# Patient Record
Sex: Female | Born: 1935
Health system: Southern US, Community
[De-identification: ages and names within clinical notes are randomized; demographics above are authoritative.]

## PROBLEM LIST (undated history)

## (undated) DIAGNOSIS — M545 Low back pain, unspecified: Secondary | ICD-10-CM

## (undated) DIAGNOSIS — J209 Acute bronchitis, unspecified: Secondary | ICD-10-CM

## (undated) DIAGNOSIS — M1990 Unspecified osteoarthritis, unspecified site: Secondary | ICD-10-CM

## (undated) DIAGNOSIS — I509 Heart failure, unspecified: Secondary | ICD-10-CM

## (undated) DIAGNOSIS — G4700 Insomnia, unspecified: Secondary | ICD-10-CM

## (undated) DIAGNOSIS — E1329 Other specified diabetes mellitus with other diabetic kidney complication: Secondary | ICD-10-CM

## (undated) DIAGNOSIS — E876 Hypokalemia: Secondary | ICD-10-CM

## (undated) DIAGNOSIS — Z9889 Other specified postprocedural states: Secondary | ICD-10-CM

## (undated) DIAGNOSIS — G609 Hereditary and idiopathic neuropathy, unspecified: Secondary | ICD-10-CM

## (undated) DIAGNOSIS — K449 Diaphragmatic hernia without obstruction or gangrene: Secondary | ICD-10-CM

## (undated) DIAGNOSIS — K219 Gastro-esophageal reflux disease without esophagitis: Secondary | ICD-10-CM

## (undated) DIAGNOSIS — M170 Bilateral primary osteoarthritis of knee: Secondary | ICD-10-CM

## (undated) DIAGNOSIS — M899 Disorder of bone, unspecified: Secondary | ICD-10-CM

## (undated) DIAGNOSIS — M949 Disorder of cartilage, unspecified: Secondary | ICD-10-CM

## (undated) DIAGNOSIS — N182 Chronic kidney disease, stage 2 (mild): Secondary | ICD-10-CM

## (undated) DIAGNOSIS — H409 Unspecified glaucoma: Secondary | ICD-10-CM

## (undated) DIAGNOSIS — I10 Essential (primary) hypertension: Secondary | ICD-10-CM

## (undated) DIAGNOSIS — J449 Chronic obstructive pulmonary disease, unspecified: Secondary | ICD-10-CM

## (undated) DIAGNOSIS — IMO0002 Reserved for concepts with insufficient information to code with codable children: Secondary | ICD-10-CM

## (undated) DIAGNOSIS — F419 Anxiety disorder, unspecified: Secondary | ICD-10-CM

## (undated) DIAGNOSIS — E669 Obesity, unspecified: Secondary | ICD-10-CM

## (undated) DIAGNOSIS — E785 Hyperlipidemia, unspecified: Secondary | ICD-10-CM

## (undated) DIAGNOSIS — M419 Scoliosis, unspecified: Secondary | ICD-10-CM

## (undated) DIAGNOSIS — R112 Nausea with vomiting, unspecified: Secondary | ICD-10-CM

## (undated) HISTORY — PX: ABDOMINAL HYSTERECTOMY: SHX81

## (undated) HISTORY — DX: Disorder of bone, unspecified: M89.9

## (undated) HISTORY — DX: Low back pain, unspecified: M54.50

## (undated) HISTORY — DX: Obesity, unspecified: E66.9

## (undated) HISTORY — PX: SHOULDER SURGERY: SHX246

## (undated) HISTORY — PX: FOOT SURGERY: SHX648

## (undated) HISTORY — DX: Chronic kidney disease, stage 2 (mild): N18.2

## (undated) HISTORY — DX: Reserved for concepts with insufficient information to code with codable children: IMO0002

## (undated) HISTORY — DX: Hereditary and idiopathic neuropathy, unspecified: G60.9

## (undated) HISTORY — DX: Unspecified osteoarthritis, unspecified site: M19.90

## (undated) HISTORY — DX: Heart failure, unspecified: I50.9

## (undated) HISTORY — DX: Acute bronchitis, unspecified: J20.9

## (undated) HISTORY — DX: Chronic obstructive pulmonary disease, unspecified: J44.9

## (undated) HISTORY — DX: Low back pain: M54.5

## (undated) HISTORY — DX: Unspecified glaucoma: H40.9

## (undated) HISTORY — DX: Essential (primary) hypertension: I10

## (undated) HISTORY — DX: Gastro-esophageal reflux disease without esophagitis: K21.9

## (undated) HISTORY — PX: KNEE ARTHROSCOPY: SUR90

## (undated) HISTORY — DX: Bilateral primary osteoarthritis of knee: M17.0

## (undated) HISTORY — DX: Diaphragmatic hernia without obstruction or gangrene: K44.9

## (undated) HISTORY — PX: TOTAL HIP ARTHROPLASTY: SHX124

## (undated) HISTORY — DX: Other specified diabetes mellitus with other diabetic kidney complication: E13.29

## (undated) HISTORY — DX: Hypokalemia: E87.6

## (undated) HISTORY — DX: Disorder of cartilage, unspecified: M94.9

## (undated) HISTORY — DX: Insomnia, unspecified: G47.00

## (undated) HISTORY — DX: Anxiety disorder, unspecified: F41.9

## (undated) HISTORY — DX: Hyperlipidemia, unspecified: E78.5

---

## 1998-04-23 ENCOUNTER — Ambulatory Visit (HOSPITAL_COMMUNITY): Admission: RE | Admit: 1998-04-23 | Discharge: 1998-04-23 | Payer: Self-pay | Admitting: Family Medicine

## 1998-05-08 ENCOUNTER — Other Ambulatory Visit: Admission: RE | Admit: 1998-05-08 | Discharge: 1998-05-08 | Payer: Self-pay | Admitting: Family Medicine

## 1998-05-14 ENCOUNTER — Encounter: Admission: RE | Admit: 1998-05-14 | Discharge: 1998-08-12 | Payer: Self-pay | Admitting: Orthopedic Surgery

## 1998-06-05 ENCOUNTER — Other Ambulatory Visit: Admission: RE | Admit: 1998-06-05 | Discharge: 1998-06-05 | Payer: Self-pay | Admitting: Family Medicine

## 1998-06-11 ENCOUNTER — Other Ambulatory Visit: Admission: RE | Admit: 1998-06-11 | Discharge: 1998-06-11 | Payer: Self-pay | Admitting: Family Medicine

## 1999-01-22 ENCOUNTER — Encounter: Payer: Self-pay | Admitting: Emergency Medicine

## 1999-01-22 ENCOUNTER — Emergency Department (HOSPITAL_COMMUNITY): Admission: EM | Admit: 1999-01-22 | Discharge: 1999-01-22 | Payer: Self-pay | Admitting: Emergency Medicine

## 1999-04-18 ENCOUNTER — Ambulatory Visit (HOSPITAL_COMMUNITY): Admission: RE | Admit: 1999-04-18 | Discharge: 1999-04-18 | Payer: Self-pay | Admitting: Orthopedic Surgery

## 1999-04-18 ENCOUNTER — Encounter: Payer: Self-pay | Admitting: Orthopedic Surgery

## 1999-09-12 ENCOUNTER — Emergency Department (HOSPITAL_COMMUNITY): Admission: EM | Admit: 1999-09-12 | Discharge: 1999-09-12 | Payer: Self-pay | Admitting: Emergency Medicine

## 1999-09-12 ENCOUNTER — Encounter: Payer: Self-pay | Admitting: Emergency Medicine

## 2000-05-21 ENCOUNTER — Ambulatory Visit (HOSPITAL_COMMUNITY): Admission: RE | Admit: 2000-05-21 | Discharge: 2000-05-21 | Payer: Self-pay | Admitting: Orthopedic Surgery

## 2000-06-16 ENCOUNTER — Encounter: Payer: Self-pay | Admitting: Orthopedic Surgery

## 2000-06-16 ENCOUNTER — Ambulatory Visit (HOSPITAL_COMMUNITY): Admission: RE | Admit: 2000-06-16 | Discharge: 2000-06-16 | Payer: Self-pay | Admitting: Orthopedic Surgery

## 2000-07-05 ENCOUNTER — Encounter: Admission: RE | Admit: 2000-07-05 | Discharge: 2000-10-03 | Payer: Self-pay | Admitting: Orthopedic Surgery

## 2000-11-02 ENCOUNTER — Encounter: Payer: Self-pay | Admitting: Family Medicine

## 2000-11-02 ENCOUNTER — Ambulatory Visit (HOSPITAL_COMMUNITY): Admission: RE | Admit: 2000-11-02 | Discharge: 2000-11-02 | Payer: Self-pay | Admitting: *Deleted

## 2000-11-27 ENCOUNTER — Emergency Department (HOSPITAL_COMMUNITY): Admission: EM | Admit: 2000-11-27 | Discharge: 2000-11-28 | Payer: Self-pay | Admitting: Emergency Medicine

## 2001-04-25 ENCOUNTER — Encounter: Payer: Self-pay | Admitting: Emergency Medicine

## 2001-04-25 ENCOUNTER — Emergency Department (HOSPITAL_COMMUNITY): Admission: EM | Admit: 2001-04-25 | Discharge: 2001-04-25 | Payer: Self-pay | Admitting: Emergency Medicine

## 2001-05-10 ENCOUNTER — Ambulatory Visit (HOSPITAL_COMMUNITY): Admission: RE | Admit: 2001-05-10 | Discharge: 2001-05-10 | Payer: Self-pay | Admitting: Family Medicine

## 2001-11-21 ENCOUNTER — Ambulatory Visit (HOSPITAL_COMMUNITY): Admission: RE | Admit: 2001-11-21 | Discharge: 2001-11-21 | Payer: Self-pay | Admitting: Family Medicine

## 2001-11-21 ENCOUNTER — Encounter: Payer: Self-pay | Admitting: Family Medicine

## 2002-05-11 ENCOUNTER — Ambulatory Visit (HOSPITAL_COMMUNITY): Admission: RE | Admit: 2002-05-11 | Discharge: 2002-05-11 | Payer: Self-pay | Admitting: Family Medicine

## 2002-05-25 ENCOUNTER — Encounter: Payer: Self-pay | Admitting: Orthopedic Surgery

## 2002-05-25 ENCOUNTER — Ambulatory Visit (HOSPITAL_COMMUNITY): Admission: RE | Admit: 2002-05-25 | Discharge: 2002-05-25 | Payer: Self-pay | Admitting: Orthopedic Surgery

## 2002-06-13 ENCOUNTER — Emergency Department (HOSPITAL_COMMUNITY): Admission: EM | Admit: 2002-06-13 | Discharge: 2002-06-13 | Payer: Self-pay

## 2002-06-29 ENCOUNTER — Encounter: Payer: Self-pay | Admitting: Orthopedic Surgery

## 2002-06-29 ENCOUNTER — Ambulatory Visit (HOSPITAL_COMMUNITY): Admission: RE | Admit: 2002-06-29 | Discharge: 2002-06-29 | Payer: Self-pay | Admitting: Orthopedic Surgery

## 2002-09-04 ENCOUNTER — Encounter: Payer: Self-pay | Admitting: Emergency Medicine

## 2002-09-04 ENCOUNTER — Emergency Department (HOSPITAL_COMMUNITY): Admission: EM | Admit: 2002-09-04 | Discharge: 2002-09-04 | Payer: Self-pay | Admitting: Emergency Medicine

## 2002-12-11 ENCOUNTER — Encounter: Payer: Self-pay | Admitting: Orthopedic Surgery

## 2002-12-11 ENCOUNTER — Ambulatory Visit (HOSPITAL_COMMUNITY): Admission: RE | Admit: 2002-12-11 | Discharge: 2002-12-11 | Payer: Self-pay | Admitting: Orthopedic Surgery

## 2003-12-07 ENCOUNTER — Emergency Department (HOSPITAL_COMMUNITY): Admission: EM | Admit: 2003-12-07 | Discharge: 2003-12-07 | Payer: Self-pay | Admitting: Emergency Medicine

## 2004-02-18 ENCOUNTER — Encounter: Admission: RE | Admit: 2004-02-18 | Discharge: 2004-02-18 | Payer: Self-pay | Admitting: Internal Medicine

## 2004-07-16 ENCOUNTER — Ambulatory Visit (HOSPITAL_COMMUNITY): Admission: RE | Admit: 2004-07-16 | Discharge: 2004-07-16 | Payer: Self-pay | Admitting: Family Medicine

## 2004-08-19 ENCOUNTER — Ambulatory Visit: Payer: Self-pay | Admitting: Nurse Practitioner

## 2004-10-13 ENCOUNTER — Ambulatory Visit: Payer: Self-pay | Admitting: Nurse Practitioner

## 2004-11-26 ENCOUNTER — Ambulatory Visit (HOSPITAL_COMMUNITY): Admission: RE | Admit: 2004-11-26 | Discharge: 2004-11-26 | Payer: Self-pay | Admitting: Nurse Practitioner

## 2004-11-26 ENCOUNTER — Ambulatory Visit: Payer: Self-pay | Admitting: Nurse Practitioner

## 2005-01-22 ENCOUNTER — Ambulatory Visit: Payer: Self-pay | Admitting: Nurse Practitioner

## 2005-06-16 ENCOUNTER — Ambulatory Visit: Payer: Self-pay | Admitting: Nurse Practitioner

## 2005-08-14 ENCOUNTER — Ambulatory Visit: Payer: Self-pay | Admitting: Nurse Practitioner

## 2005-08-20 ENCOUNTER — Inpatient Hospital Stay (HOSPITAL_COMMUNITY): Admission: RE | Admit: 2005-08-20 | Discharge: 2005-08-24 | Payer: Self-pay | Admitting: Orthopedic Surgery

## 2005-12-04 ENCOUNTER — Ambulatory Visit: Payer: Self-pay | Admitting: Nurse Practitioner

## 2005-12-11 ENCOUNTER — Ambulatory Visit (HOSPITAL_COMMUNITY): Admission: RE | Admit: 2005-12-11 | Discharge: 2005-12-11 | Payer: Self-pay | Admitting: Orthopedic Surgery

## 2005-12-29 ENCOUNTER — Ambulatory Visit: Admission: RE | Admit: 2005-12-29 | Discharge: 2005-12-29 | Payer: Self-pay | Admitting: Orthopedic Surgery

## 2006-02-09 ENCOUNTER — Ambulatory Visit: Payer: Self-pay | Admitting: Nurse Practitioner

## 2006-07-05 ENCOUNTER — Ambulatory Visit: Payer: Self-pay | Admitting: Nurse Practitioner

## 2006-07-22 ENCOUNTER — Ambulatory Visit: Payer: Self-pay | Admitting: Nurse Practitioner

## 2006-08-17 ENCOUNTER — Encounter: Admission: RE | Admit: 2006-08-17 | Discharge: 2006-08-17 | Payer: Self-pay | Admitting: Orthopedic Surgery

## 2006-12-07 ENCOUNTER — Ambulatory Visit: Payer: Self-pay | Admitting: Nurse Practitioner

## 2006-12-23 ENCOUNTER — Encounter: Admission: RE | Admit: 2006-12-23 | Discharge: 2006-12-23 | Payer: Self-pay | Admitting: Orthopedic Surgery

## 2007-03-08 ENCOUNTER — Ambulatory Visit: Payer: Self-pay | Admitting: Nurse Practitioner

## 2007-05-09 ENCOUNTER — Ambulatory Visit: Payer: Self-pay | Admitting: Family Medicine

## 2007-08-08 ENCOUNTER — Encounter: Admission: RE | Admit: 2007-08-08 | Discharge: 2007-08-08 | Payer: Self-pay | Admitting: Orthopedic Surgery

## 2007-08-23 ENCOUNTER — Encounter (INDEPENDENT_AMBULATORY_CARE_PROVIDER_SITE_OTHER): Payer: Self-pay | Admitting: Nurse Practitioner

## 2007-08-23 ENCOUNTER — Ambulatory Visit: Payer: Self-pay | Admitting: Family Medicine

## 2007-08-23 ENCOUNTER — Other Ambulatory Visit: Admission: RE | Admit: 2007-08-23 | Discharge: 2007-08-23 | Payer: Self-pay | Admitting: Family Medicine

## 2007-08-23 LAB — CONVERTED CEMR LAB
ALT: 10 units/L (ref 0–35)
AST: 12 units/L (ref 0–37)
Albumin: 4.3 g/dL (ref 3.5–5.2)
Alkaline Phosphatase: 108 units/L (ref 39–117)
BUN: 24 mg/dL — ABNORMAL HIGH (ref 6–23)
Basophils Absolute: 0 10*3/uL (ref 0.0–0.1)
Basophils Relative: 0 % (ref 0–1)
CO2: 24 meq/L (ref 19–32)
Calcium: 9.3 mg/dL (ref 8.4–10.5)
Chloride: 105 meq/L (ref 96–112)
Creatinine, Ser: 1.53 mg/dL — ABNORMAL HIGH (ref 0.40–1.20)
Eosinophils Absolute: 0.2 10*3/uL (ref 0.0–0.7)
Eosinophils Relative: 2 % (ref 0–5)
Glucose, Bld: 147 mg/dL — ABNORMAL HIGH (ref 70–99)
HCT: 39.2 % (ref 36.0–46.0)
Hemoglobin: 12.8 g/dL (ref 12.0–15.0)
Lymphocytes Relative: 37 % (ref 12–46)
Lymphs Abs: 2.5 10*3/uL (ref 0.7–3.3)
MCHC: 32.7 g/dL (ref 30.0–36.0)
MCV: 88.3 fL (ref 78.0–100.0)
Monocytes Absolute: 0.4 10*3/uL (ref 0.2–0.7)
Monocytes Relative: 6 % (ref 3–11)
Neutro Abs: 3.6 10*3/uL (ref 1.7–7.7)
Neutrophils Relative %: 54 % (ref 43–77)
Platelets: 242 10*3/uL (ref 150–400)
Potassium: 3.7 meq/L (ref 3.5–5.3)
RBC: 4.44 M/uL (ref 3.87–5.11)
RDW: 14.1 % — ABNORMAL HIGH (ref 11.5–14.0)
Sodium: 141 meq/L (ref 135–145)
TSH: 0.988 microintl units/mL (ref 0.350–5.50)
Total Bilirubin: 0.5 mg/dL (ref 0.3–1.2)
Total Protein: 7.3 g/dL (ref 6.0–8.3)
WBC: 6.7 10*3/uL (ref 4.0–10.5)

## 2007-09-05 ENCOUNTER — Ambulatory Visit (HOSPITAL_COMMUNITY): Admission: RE | Admit: 2007-09-05 | Discharge: 2007-09-05 | Payer: Self-pay | Admitting: Family Medicine

## 2007-10-10 ENCOUNTER — Encounter: Admission: RE | Admit: 2007-10-10 | Discharge: 2007-10-10 | Payer: Self-pay | Admitting: Orthopedic Surgery

## 2007-11-29 ENCOUNTER — Ambulatory Visit: Payer: Self-pay | Admitting: Internal Medicine

## 2008-02-16 ENCOUNTER — Inpatient Hospital Stay (HOSPITAL_COMMUNITY): Admission: RE | Admit: 2008-02-16 | Discharge: 2008-02-20 | Payer: Self-pay | Admitting: Orthopedic Surgery

## 2008-03-06 ENCOUNTER — Encounter (INDEPENDENT_AMBULATORY_CARE_PROVIDER_SITE_OTHER): Payer: Self-pay | Admitting: Orthopedic Surgery

## 2008-03-06 ENCOUNTER — Ambulatory Visit: Payer: Self-pay | Admitting: Surgery

## 2008-03-06 ENCOUNTER — Ambulatory Visit (HOSPITAL_COMMUNITY): Admission: RE | Admit: 2008-03-06 | Discharge: 2008-03-06 | Payer: Self-pay | Admitting: Orthopedic Surgery

## 2008-04-17 ENCOUNTER — Emergency Department (HOSPITAL_COMMUNITY): Admission: EM | Admit: 2008-04-17 | Discharge: 2008-04-18 | Payer: Self-pay | Admitting: Emergency Medicine

## 2008-05-17 ENCOUNTER — Ambulatory Visit: Payer: Self-pay | Admitting: Internal Medicine

## 2008-05-17 LAB — CONVERTED CEMR LAB
Albumin: 4.2 g/dL (ref 3.5–5.2)
Alkaline Phosphatase: 128 units/L — ABNORMAL HIGH (ref 39–117)
BUN: 15 mg/dL (ref 6–23)
CO2: 24 meq/L (ref 19–32)
Calcium: 8.9 mg/dL (ref 8.4–10.5)
Cholesterol: 169 mg/dL (ref 0–200)
Eosinophils Absolute: 0.2 10*3/uL (ref 0.0–0.7)
Glucose, Bld: 169 mg/dL — ABNORMAL HIGH (ref 70–99)
HCT: 38.1 % (ref 36.0–46.0)
HDL: 50 mg/dL (ref >39)
Lymphocytes Relative: 36 % (ref 12–46)
Lymphs Abs: 2.2 10*3/uL (ref 0.7–4.0)
MCV: 84.1 fL (ref 78.0–100.0)
Monocytes Relative: 8 % (ref 3–12)
Neutrophils Relative %: 53 % (ref 43–77)
Potassium: 4.2 meq/L (ref 3.5–5.3)
RBC: 4.53 M/uL (ref 3.87–5.11)
Sodium: 143 meq/L (ref 135–145)
Total Protein: 7.3 g/dL (ref 6.0–8.3)
Triglycerides: 178 mg/dL — ABNORMAL HIGH (ref <150)
WBC: 6 10*3/uL (ref 4.0–10.5)

## 2008-07-27 ENCOUNTER — Encounter: Admission: RE | Admit: 2008-07-27 | Discharge: 2008-07-27 | Payer: Self-pay | Admitting: Orthopedic Surgery

## 2008-08-20 ENCOUNTER — Ambulatory Visit: Payer: Self-pay | Admitting: Internal Medicine

## 2008-08-20 ENCOUNTER — Encounter (INDEPENDENT_AMBULATORY_CARE_PROVIDER_SITE_OTHER): Payer: Self-pay | Admitting: Nurse Practitioner

## 2008-08-20 LAB — CONVERTED CEMR LAB
ALT: 8 units/L (ref 0–35)
AST: 15 units/L (ref 0–37)
BUN: 18 mg/dL (ref 6–23)
Basophils Absolute: 0 10*3/uL (ref 0.0–0.1)
Basophils Relative: 0 % (ref 0–1)
Calcium: 9.4 mg/dL (ref 8.4–10.5)
Chloride: 108 meq/L (ref 96–112)
Cholesterol: 181 mg/dL (ref 0–200)
Creatinine, Ser: 1.13 mg/dL (ref 0.40–1.20)
Eosinophils Relative: 3 % (ref 0–5)
HCT: 39.3 % (ref 36.0–46.0)
HDL: 63 mg/dL (ref >39)
Hemoglobin: 12.7 g/dL (ref 12.0–15.0)
MCHC: 32.3 g/dL (ref 30.0–36.0)
MCV: 87.7 fL (ref 78.0–100.0)
Monocytes Absolute: 0.5 10*3/uL (ref 0.1–1.0)
Monocytes Relative: 8 % (ref 3–12)
Neutro Abs: 3.8 10*3/uL (ref 1.7–7.7)
RBC: 4.48 M/uL (ref 3.87–5.11)
RDW: 15.2 % (ref 11.5–15.5)
TSH: 1.025 microintl units/mL (ref 0.350–4.50)
Total Bilirubin: 0.5 mg/dL (ref 0.3–1.2)
Total CHOL/HDL Ratio: 2.9
VLDL: 30 mg/dL (ref 0–40)

## 2008-11-20 ENCOUNTER — Encounter: Admission: RE | Admit: 2008-11-20 | Discharge: 2008-11-20 | Payer: Self-pay | Admitting: Orthopedic Surgery

## 2008-12-26 ENCOUNTER — Ambulatory Visit: Payer: Self-pay | Admitting: Internal Medicine

## 2009-01-24 ENCOUNTER — Encounter (INDEPENDENT_AMBULATORY_CARE_PROVIDER_SITE_OTHER): Payer: Self-pay | Admitting: Adult Health

## 2009-01-24 ENCOUNTER — Ambulatory Visit: Payer: Self-pay | Admitting: Internal Medicine

## 2009-01-24 LAB — CONVERTED CEMR LAB
Basophils Absolute: 0 10*3/uL (ref 0.0–0.1)
Basophils Relative: 0 % (ref 0–1)
Cholesterol: 148 mg/dL (ref 0–200)
Eosinophils Relative: 3 % (ref 0–5)
HCT: 38.6 % (ref 36.0–46.0)
Hemoglobin: 12.9 g/dL (ref 12.0–15.0)
MCHC: 33.4 g/dL (ref 30.0–36.0)
Monocytes Absolute: 0.4 10*3/uL (ref 0.1–1.0)
Platelets: 255 10*3/uL (ref 150–400)
RDW: 14.5 % (ref 11.5–15.5)
TSH: 1.512 microintl units/mL (ref 0.350–4.50)

## 2009-03-04 ENCOUNTER — Ambulatory Visit: Payer: Self-pay | Admitting: Internal Medicine

## 2009-04-02 ENCOUNTER — Encounter (INDEPENDENT_AMBULATORY_CARE_PROVIDER_SITE_OTHER): Payer: Self-pay | Admitting: Adult Health

## 2009-04-02 ENCOUNTER — Ambulatory Visit: Payer: Self-pay | Admitting: Internal Medicine

## 2009-04-02 LAB — CONVERTED CEMR LAB: Microalb, Ur: 1.38 mg/dL (ref 0.00–1.89)

## 2009-04-23 ENCOUNTER — Ambulatory Visit: Payer: Self-pay | Admitting: Internal Medicine

## 2009-07-22 ENCOUNTER — Encounter (INDEPENDENT_AMBULATORY_CARE_PROVIDER_SITE_OTHER): Payer: Self-pay | Admitting: Adult Health

## 2009-07-22 ENCOUNTER — Ambulatory Visit: Payer: Self-pay | Admitting: Internal Medicine

## 2009-07-22 LAB — CONVERTED CEMR LAB
ALT: 8 units/L (ref 0–35)
AST: 12 units/L (ref 0–37)
Albumin: 4 g/dL (ref 3.5–5.2)
Calcium: 9.1 mg/dL (ref 8.4–10.5)
Chloride: 111 meq/L (ref 96–112)
Helicobacter Pylori Antibody-IgG: 0.7
Potassium: 3.8 meq/L (ref 3.5–5.3)
Sodium: 142 meq/L (ref 135–145)
Total CHOL/HDL Ratio: 5.2
Total Protein: 6.7 g/dL (ref 6.0–8.3)

## 2009-08-13 ENCOUNTER — Ambulatory Visit: Payer: Self-pay | Admitting: Internal Medicine

## 2009-08-13 ENCOUNTER — Encounter (INDEPENDENT_AMBULATORY_CARE_PROVIDER_SITE_OTHER): Payer: Self-pay | Admitting: Adult Health

## 2009-08-13 LAB — CONVERTED CEMR LAB
CO2: 21 meq/L (ref 19–32)
Calcium: 9.4 mg/dL (ref 8.4–10.5)
Potassium: 3.7 meq/L (ref 3.5–5.3)
Sodium: 141 meq/L (ref 135–145)

## 2009-08-16 ENCOUNTER — Ambulatory Visit: Payer: Self-pay | Admitting: Internal Medicine

## 2009-08-26 ENCOUNTER — Encounter: Admission: RE | Admit: 2009-08-26 | Discharge: 2009-08-26 | Payer: Self-pay | Admitting: Orthopedic Surgery

## 2009-08-30 ENCOUNTER — Encounter (INDEPENDENT_AMBULATORY_CARE_PROVIDER_SITE_OTHER): Payer: Self-pay | Admitting: Adult Health

## 2009-08-30 ENCOUNTER — Ambulatory Visit: Payer: Self-pay | Admitting: Internal Medicine

## 2009-08-30 LAB — CONVERTED CEMR LAB
CO2: 22 meq/L (ref 19–32)
Chloride: 106 meq/L (ref 96–112)
Creatinine, Ser: 1.96 mg/dL — ABNORMAL HIGH (ref 0.40–1.20)

## 2009-09-23 ENCOUNTER — Ambulatory Visit: Payer: Self-pay | Admitting: Internal Medicine

## 2009-09-23 ENCOUNTER — Encounter (INDEPENDENT_AMBULATORY_CARE_PROVIDER_SITE_OTHER): Payer: Self-pay | Admitting: Adult Health

## 2009-09-23 LAB — CONVERTED CEMR LAB
BUN: 20 mg/dL (ref 6–23)
CO2: 23 meq/L (ref 19–32)
Chloride: 105 meq/L (ref 96–112)
Creatinine, Ser: 1.49 mg/dL — ABNORMAL HIGH (ref 0.40–1.20)

## 2009-10-04 ENCOUNTER — Encounter: Admission: RE | Admit: 2009-10-04 | Discharge: 2009-10-04 | Payer: Self-pay | Admitting: Orthopedic Surgery

## 2009-10-22 ENCOUNTER — Ambulatory Visit: Payer: Self-pay | Admitting: Internal Medicine

## 2009-10-22 ENCOUNTER — Encounter (INDEPENDENT_AMBULATORY_CARE_PROVIDER_SITE_OTHER): Payer: Self-pay | Admitting: Adult Health

## 2009-10-22 LAB — CONVERTED CEMR LAB
AST: 13 units/L (ref 0–37)
Alkaline Phosphatase: 82 units/L (ref 39–117)
BUN: 16 mg/dL (ref 6–23)
Creatinine, Ser: 1.46 mg/dL — ABNORMAL HIGH (ref 0.40–1.20)
HDL: 46 mg/dL (ref >39)
Helicobacter Pylori Antibody-IgG: 0.8
LDL Cholesterol: 46 mg/dL (ref 0–99)
Potassium: 3.9 meq/L (ref 3.5–5.3)
Total CHOL/HDL Ratio: 3
VLDL: 45 mg/dL — ABNORMAL HIGH (ref 0–40)

## 2009-12-26 ENCOUNTER — Ambulatory Visit: Payer: Self-pay | Admitting: Family Medicine

## 2009-12-27 ENCOUNTER — Ambulatory Visit (HOSPITAL_COMMUNITY): Admission: RE | Admit: 2009-12-27 | Discharge: 2009-12-27 | Payer: Self-pay | Admitting: Internal Medicine

## 2010-01-22 ENCOUNTER — Ambulatory Visit: Payer: Self-pay | Admitting: Internal Medicine

## 2010-01-22 ENCOUNTER — Encounter (INDEPENDENT_AMBULATORY_CARE_PROVIDER_SITE_OTHER): Payer: Self-pay | Admitting: Adult Health

## 2010-01-22 LAB — CONVERTED CEMR LAB
Calcium: 9.3 mg/dL (ref 8.4–10.5)
Creatinine, Ser: 1.31 mg/dL — ABNORMAL HIGH (ref 0.40–1.20)

## 2010-02-12 ENCOUNTER — Encounter (INDEPENDENT_AMBULATORY_CARE_PROVIDER_SITE_OTHER): Payer: Self-pay | Admitting: Adult Health

## 2010-02-12 ENCOUNTER — Ambulatory Visit: Payer: Self-pay | Admitting: Internal Medicine

## 2010-02-12 LAB — CONVERTED CEMR LAB
Chloride: 105 meq/L (ref 96–112)
Potassium: 4.2 meq/L (ref 3.5–5.3)

## 2010-02-13 ENCOUNTER — Encounter: Admission: RE | Admit: 2010-02-13 | Discharge: 2010-02-13 | Payer: Self-pay | Admitting: Orthopedic Surgery

## 2010-05-14 ENCOUNTER — Encounter (INDEPENDENT_AMBULATORY_CARE_PROVIDER_SITE_OTHER): Payer: Self-pay | Admitting: Adult Health

## 2010-05-14 ENCOUNTER — Ambulatory Visit: Payer: Self-pay | Admitting: Internal Medicine

## 2010-05-14 LAB — CONVERTED CEMR LAB
AST: 18 units/L (ref 0–37)
Alkaline Phosphatase: 108 units/L (ref 39–117)
BUN: 14 mg/dL (ref 6–23)
Creatinine, Ser: 1.28 mg/dL — ABNORMAL HIGH (ref 0.40–1.20)
HDL: 48 mg/dL (ref >39)
Hgb A1c MFr Bld: 6.9 % — ABNORMAL HIGH (ref <5.7)
LDL Cholesterol: 56 mg/dL (ref 0–99)
Potassium: 3.8 meq/L (ref 3.5–5.3)
Total Bilirubin: 0.4 mg/dL (ref 0.3–1.2)
Total CHOL/HDL Ratio: 3
Triglycerides: 202 mg/dL — ABNORMAL HIGH (ref <150)
VLDL: 40 mg/dL (ref 0–40)

## 2010-05-19 ENCOUNTER — Ambulatory Visit (HOSPITAL_COMMUNITY): Admission: RE | Admit: 2010-05-19 | Discharge: 2010-05-19 | Payer: Self-pay | Admitting: Family Medicine

## 2010-05-23 ENCOUNTER — Ambulatory Visit (HOSPITAL_COMMUNITY): Admission: RE | Admit: 2010-05-23 | Discharge: 2010-05-23 | Payer: Self-pay | Admitting: Family Medicine

## 2010-11-17 ENCOUNTER — Encounter
Admission: RE | Admit: 2010-11-17 | Discharge: 2010-11-17 | Payer: Self-pay | Source: Home / Self Care | Attending: Orthopedic Surgery | Admitting: Orthopedic Surgery

## 2011-02-10 ENCOUNTER — Encounter (INDEPENDENT_AMBULATORY_CARE_PROVIDER_SITE_OTHER): Payer: Self-pay | Admitting: *Deleted

## 2011-02-10 LAB — CONVERTED CEMR LAB
CO2: 24 meq/L (ref 19–32)
Calcium: 9.4 mg/dL (ref 8.4–10.5)
Chloride: 103 meq/L (ref 96–112)
Creatinine, Ser: 1.29 mg/dL — ABNORMAL HIGH (ref 0.40–1.20)
Glucose, Bld: 207 mg/dL — ABNORMAL HIGH (ref 70–99)

## 2011-04-14 NOTE — Op Note (Signed)
NAMEMarland Kitchen  Michelle Buck, Michelle Buck NO.:  [id number]   MEDICAL RECORD NO.:  [id number]          PATIENT TYPE:  INP   LOCATION:  5030                         FACILITY:  [facility]   PHYSICIAN:  Marily Memos, MD      DATE OF BIRTH:  1936-09-01   DATE OF PROCEDURE:  02/16/2008  DATE OF DISCHARGE:                               OPERATIVE REPORT   PREOPERATIVE DIAGNOSIS:  Degenerative arthritis, left hip.   POSTOPERATIVE DIAGNOSIS:  Degenerative arthritis, left hip.   ANESTHESIA:  General.   PROCEDURE:  Left total hip arthroplasty, Biomet implant.   DESCRIPTION OF PROCEDURE:  The patient was taken to the operating room  after given adequate preop medications, given general anesthesia, and  was intubated.  The patient was placed in the left lateral position.  The left hip was prepped with DuraPrep and draped in a sterile manner.  A posterior southern approach was made over the left hip, going through  skin and subcutaneous tissue down to the fascia.  The short rotator was  identified and released.  The capsule was identified and incised.  The  hip socket was very tight and difficult to dislocate.  The femoral head  had to be cut first in order to get the femoral head out of the  acetabulum.  Once the femoral head was removed, soft tissue resection  about the acetabulum was then done.  The acetabulum was exposed.  Reaming of the acetabulum was started with a 41 mm reamer and progressed  up to 49 mm.  Reaming was done down to good bleeding bone.  The trial  implant was fitted at 50 mm, which was found to be a very good snug fit.  Next, attention was turned to the shaft of the femur.  Reaming was  started with a cookie cutter, followed by initiating a proximal reamer  and a lateralizing reamer.  Rasping was then done from 5 up to 7.5 mm.  The 7.5 was a very tight snug fit with grip on the bone.  With the trial  stem in place, the trial acetabular and femoral head were tested.  The  +3  mm head was found to be most stable with full flexion and extension,  good internal-external rotation, no telescoping and good leg length.  Copious irrigation was done.  The trial implants were removed and the  final acetabular component was that of a 50-mm 3-hole porous-coated  acetabular component with a +10 degree liner.  The femoral head was that  of a 32 mm +3 head and neck component.  Three acetabular screws were put  in place.  With the final implant in place, again, range of motion with  full flexion, full extension, good leg length and no telescoping, good  stability.  Copious and abundant irrigation was done.  Wound closure was  then done with 0 Vicryl for the fascia, 2-0 for the subcutaneous and  skin, and skin staples for the skin.  A compressive dressing was  applied.  The patient tolerated procedure quite well and went to the  recovery room in stable  and satisfactory condition.  The patient was  placed in a hip abduction pillow.      Marily Memos, MD  Electronically Signed     AC/MEDQ  D:  02/16/2008  T:  02/16/2008  Job:  [id number]

## 2011-04-17 NOTE — Op Note (Signed)
[place]. [facility]  Patient:    RENAI, [name]                         MRN: [id number] Adm. Date:  [id number] Disc. Date: [id number] Attending:  Mayme Genta                           Operative Report  PREOPERATIVE DIAGNOSES:  Impingement syndrome, left shoulder with subacromial bursitis.  POSTOPERATIVE DIAGNOSES:  Impingement syndrome, left shoulder with large osteophyte and loose body, left shoulder.  ANESTHESIA:  General.  PROCEDURE: 1. Arthroscopic synovectomy left shoulder and acromioplasty. 2. Open arthrotomy incision for removal of loose body from left shoulder.  DESCRIPTION OF PROCEDURE:  The patient was taken to the operating room after being given general anesthesia and intubated.  The patient was placed in a barber chair sitting position.  Left shoulder was prepped with DuraPrep and draped in a sterile manner.  An Omer puncture wound made posteriorly over the shoulder going through the skin and subcutaneous tissue.  A swisher rod was placed from posterior to anterior along the subacromial space.  Anterior incision made over the swisher rod for anterior water inflow.  Inspection of the joint reveals tremendous ebonation and break down under the subacromial space, but a large loose osteochondral fragment.  The rotator cuff had partial tear, but not through and through, disruption, chronic synovitis with hypertrophic synovium.  A complete synovectomy was done initially allowing better visualization.  An acromioplasty was then done with the use of a bur after adequate removal of debris.  There was a large osteochondral fragment anteriorly which was not able to be retrieved with use of arthroscope.  A cephalad incision made anterolaterally and the loose fragment was removed. Copious antibiotic irrigation was done with antibiotic solution.  Wound closure was then done with use of #0 Vicryl for the fascial, 2-0 for the subcutaneous, and the  skin was closed with 4-0 nylon.  A compression dressing was applied and an immobilizing sling was applied.  The patient tolerated procedure quite well and went to recovery room in stable and satisfactory condition.  DISPOSITION:  The patient was ready to be discharged home with ice packs, Percocet 10 mg q.4h. p.r.n. for pain and to return to the office in one week. DD:  07/21/00 TD:  07/21/00 Job: [id number]

## 2011-04-17 NOTE — Discharge Summary (Signed)
NAMEMarland Kitchen  Michelle Buck, Michelle Buck NO.:  [id number]   MEDICAL RECORD NO.:  [id number]          PATIENT TYPE:  INP   LOCATION:  5030                         FACILITY:  [facility]   PHYSICIAN:  Marily Memos, MD      DATE OF BIRTH:  [birth year]   DATE OF ADMISSION:  02/16/2008  DATE OF DISCHARGE:  02/20/2008                               DISCHARGE SUMMARY   ADMISSION DIAGNOSIS:  Degenerative arthropathy of left hip.   DISCHARGE DIAGNOSIS:  Degenerative arthropathy of left hip.   COMPLICATIONS:  None.   INFECTIONS:  None.   OPERATION:  Left total hip arthroplasty.   PERTINENT HISTORY:  This is a 75 year old black female followed in the  office for the past few years for degenerative arthritis of her left  hip.  The patient was treated with anti-inflammatories, use of walker  and cane.  The patient's pain and swallow dysfunction worsened to the  point of having pain both at rest, bedtime, and difficulty getting out  of bed.  Pertinent history was of the left hip.  The patient walks with  slight antalgic gait, left side.  Left hip range of motion was limited  rotation and flexion.  Crepitus, palpable and audible on attempted  flexion of the left hip, tender anterior and laterally.  Neurovascular  status was intact.  X-rays revealed loss of joint space of the left hip  with sclerosis.   HOSPITAL COURSE:  The patient underwent preop medical evaluation and was  found to be stable to undergo surgery.  The patient had preop CBC, EKG,  chest x-ray, CMET, UA, PT, PTT, and platelet count.  The patient's  laboratories were stable enough to undergo surgery.  The patient  underwent left total hip arthroplasty on February 16, 2008.  She tolerated  the procedure quite well.  Postop course was fairly benign.  The patient  was started on prophylactic IV antibiotics and Coumadin therapy,  physical therapy with partial weightbearing on the left side, 50%.  The  patient progressed quite well with her  physical therapy and became  ambulatory with the use of walker, partial weightbearing on the left  side.  The patient did have acute blood loss anemia.  The patient then  became stable enough to be discharged and was discharged on Percocet 1-2  q. 4 p.r.n. pain and Feosol 325 b.i.d.      Marily Memos, MD  Electronically Signed     AC/MEDQ  D:  03/14/2008  T:  03/14/2008  Job:  [id number]

## 2011-04-17 NOTE — Op Note (Signed)
Michelle Buck, Michelle Buck NO.:  [id number]   MEDICAL RECORD NO.:  [id number]          PATIENT TYPE:  INP   LOCATION:  5038                         FACILITY:  [facility]   PHYSICIAN:  Marily Memos, MD      DATE OF BIRTH:  December 30, 1935   DATE OF PROCEDURE:  08/20/2005  DATE OF DISCHARGE:  08/24/2005                                 OPERATIVE REPORT   PREOPERATIVE DIAGNOSIS:  Degenerative arthritis, right hip.   POSTOPERATIVE DIAGNOSIS:  Degenerative arthritis, right hip.   OPERATION PERFORMED:  Right total hip arthroplasty using DePuy total hip.   SURGEON:  Marily Memos, M.D.   ANESTHESIA:  General.   DESCRIPTION OF THE OPERATION:  The patient was brought to the operating  room.  After she as given adequate premedication she was given general  anesthesia, and intubated.  The patient was placed in the right lateral  position.  The right hip was prepped with DuraPrep and draped in a sterile  manner.   A posterior southern approach was made over the right hip going through the  skin and subcutaneous tissue down to the fascia.  The short rotators were  identified and released. __________  was identified and excised.  The hip  itself was dislocated.  A femoral cutting jig was put in place and the  femoral head was resected.  With the acetabulum exposed, acetabular reaming  was initiated.  After adequate reaming of the acetabulum for a 52 mm  component.  A trial component was put in place and was found to seat quite  well and stable.  The trial component was removed.  The final acetabular  component was put into place and hammered in place with a very good fitting.  No screws were needed.   Next, attention was turned to the proximal femur.  Reaming of the femur was  initiated with the use of a cookie cutter followed by reaming down the  canal.  The sizing of the femoral component was that of a small stature of  155 x 43 mm implant, 13.5.  Rasps were put into place.  Trial  components for  the head and neck were put into place, and with a 32 mm Plus 1 femoral head  range of motion was found to be full.  There was no telescoping and no  subluxation.  Good leg length.   Next, the trial components were removed and an AML small stature 155 mm  length by 43 mm offset, 13.5 size stem was hammered down the femoral canal.  A 32 mm Plus 1 femoral head was put in place.  Acetabular lip liner, 52 mm,  was snapped in place.  The hip was reduced.  The range of motion again was  found to be good with good leg  length. Copious irrigation was done.  The fascia reapproximated with 0-  Vicryl, 2-0 for the subcutaneous and skin staples for the skin.  Compressive  dressing was applied.  The patient was placed in a hip abduction pillow.   The patient tolerated the procedure quite well and went  to the recovery room  in stable and satisfactory condition.      Marily Memos, MD  Electronically Signed     AC/MEDQ  D:  10/30/2005  T:  11/01/2005  Job:  [id number]

## 2011-04-17 NOTE — H&P (Signed)
[place]  Patient:    Michelle Buck, Michelle Buck                         MRN: [id number] Adm. Date:  [id number] Disc. Date: [id number] Attending:  Mayme Genta                         History and Physical  CHIEF COMPLAINT:  Painful left shoulder.  HISTORY OF PRESENT ILLNESS:  This is a 75 year old female who is followed in the office for impingement and subacromial bursitis involving her left shoulder and rotator cuff.  The patient has been given therapeutic injections and warm compresses which did well for a while, but progressively got worse. The patient had weakness of abduction and full extension of the left arm with persisting pain, even with the use of pain medication.  PAST MEDICAL HISTORY:  Right shoulder arthroscopy, right and left knee replacements and hysterectomy, history of severe degenerative joint disease, high blood pressure, asthma, diabetes mellitus.  MEDICATIONS:  K-Dur, Darvocet-N 100, Prevacid, Humulin insulin 70/30 25 units a.m., Betoptic, Actos, Lotensin 20 mg, BuSpar 10 mg, glucophage, Celebrex, Alphagan, baby aspirin, multivitamins.  ALLERGIES:  CODEINE.  HABITS:  None.  FAMILY HISTORY:  Noncontributory.  REVIEW OF SYSTEMS:  Episodes of low back and hip pain from degenerative joint disease.  No cardiac symptoms.  No urinary or bowel symptoms.  PHYSICAL EXAMINATION:  VITAL SIGNS:  Temperature 97.8, pulse 72, respirations 20, blood pressure 154/88, height 62-1/2 inches, weight 225 pounds.  HEENT:  Normocephalic.  Eyes:  Conjunctivae and sclerae clear.  NECK:  Supple.  CHEST:  Clear.  CARDIAC:  S1 and S2 regular.  EXTREMITIES:  Left shoulder tender anteriorly and laterally with limited abduction in external rotation with some crepitus.  Pain increased on resistive abduction and external rotation.  Bony prominence anteriorly with some marked tenderness about the bicipital groove.  IMPRESSION:  Impingement  syndrome, left shoulder, with subacromial bursitis. DD:  07/21/00 TD:  07/21/00 Job: [id number]

## 2011-04-17 NOTE — Discharge Summary (Signed)
Michelle Buck, Michelle Buck NO.:  [id number]   MEDICAL RECORD NO.:  [id number]          PATIENT TYPE:  INP   LOCATION:  5038                         FACILITY:  [facility]   PHYSICIAN:  Marily Memos, MD      DATE OF BIRTH:  02/25/1936   DATE OF ADMISSION:  08/20/2005  DATE OF DISCHARGE:  08/24/2005                                 DISCHARGE SUMMARY   ADMITTING DIAGNOSES:  1.  Degenerative arthritis, right hip.  2.  Diabetes mellitus.  3.  Degenerative joint disease.  4.  High blood pressure.  5.  Asthma.  6.  History of glaucoma.  7.  History of sinus bradycardia with sinus arrhythmia.   DISCHARGE DIAGNOSES:  1.  Degenerative arthritis, right hip.  2.  Diabetes mellitus.  3.  Degenerative joint disease.  4.  High blood pressure.  5.  Asthma.  6.  History of glaucoma.  7.  History of sinus bradycardia with sinus arrhythmia.   COMPLICATIONS:  None.   INFECTIONS:  None.   OPERATION:  Right total hip arthroplasty.   PERTINENT HISTORY:  This is a 75 year old black female followed in the  office over the past few year for degenerative joint disease involving the  right hip, which progressively worsened over the past few months.  The  patient was treated with anti-inflammatories, used a quad cane and  analgesics.  The patient's pain is both at rest as well as with ambulation.   PERTINENT PHYSICAL EXAMINATION:  Pertinent physical shows an antalgic gait.  Range of motion of the right hip is limited on rotation.  There is pain on  flexion and rotation, but also with pain on attempted rotation as well as  flexion and extension of the hip.  Neurovascular status is intact.   X-rays of the right hip demonstrated the loss of joint space of the right  hip with some sclerosis.   HOSPITAL COURSE:  The patient had a preop evaluation by a medical doctor and  was found to be stable for surgery.   Preop laboratory:  CBC, UA, EKG, chest x-ray, and CMET - the patient's  laboratory data was stable enough for the patient to undergo surgery.   The patient underwent right total hip arthroplasty.  She tolerated the  procedure quite well.  The postop course was fairly benign.  I started pre-  and postoperative IV antibiotics, Coumadin therapy, physical therapy, and  occupational therapy.  The patient's progress was partial weightbearing to  an extent on the right hip.  She tolerated therapy quite well.  She was  ambulating and was stable enough to be discharged to home with home health  and PT arranged.   MEDICATIONS:  1.  Feosol 300 mg twice a day.  2.  Percocet 5 mg q.4 hours p.r.n.  3.  Coumadin 5 mg.   FOLLOW UP:  The patient is to return to the office in one week.   DISCHARGE CONDITION:  The patient was discharged in stable condition.      Marily Memos, MD  Electronically Signed     AC/MEDQ  D:  10/30/2005  T:  11/01/2005  Job:  [id number]

## 2011-05-27 ENCOUNTER — Encounter: Payer: Self-pay | Admitting: Gastroenterology

## 2011-05-29 ENCOUNTER — Ambulatory Visit: Payer: Self-pay | Admitting: Gastroenterology

## 2011-07-07 ENCOUNTER — Encounter: Payer: Self-pay | Admitting: Gastroenterology

## 2011-07-07 ENCOUNTER — Ambulatory Visit (INDEPENDENT_AMBULATORY_CARE_PROVIDER_SITE_OTHER): Payer: Medicare Other | Admitting: Gastroenterology

## 2011-07-07 VITALS — BP 130/82 | HR 60 | Ht 62.0 in | Wt 226.4 lb

## 2011-07-07 DIAGNOSIS — R1013 Epigastric pain: Secondary | ICD-10-CM

## 2011-07-07 DIAGNOSIS — K219 Gastro-esophageal reflux disease without esophagitis: Secondary | ICD-10-CM

## 2011-07-07 DIAGNOSIS — Z1211 Encounter for screening for malignant neoplasm of colon: Secondary | ICD-10-CM

## 2011-07-07 MED ORDER — PEG-KCL-NACL-NASULF-NA ASC-C 100 G PO SOLR: 1.0000 | Freq: Once | ORAL | Status: DC

## 2011-07-07 NOTE — Patient Instructions (Addendum)
You will be set up for an ultrasound for epigastric abd pains (check for gallstones). Nothing to eat or drink after midnight.  You should avoid epsum salts, can be very damaging to your gi tract. You will be set up for a colonoscopy for colon cancer screening (at [facility] with propofol given chronic narcotic use) You will be set up for an upper endoscopy for epigastric pain, GERD, dysphagia.

## 2011-07-07 NOTE — Progress Notes (Signed)
HPI: This is a  very pleasant 75 year old woman  She says she has ulcers and hiatal hernia, diagnosed by imaging studies she tells me..  She was told this about two years ago.  A ct two years ago showed a "large hiatal hernia."  She has pain in epigastrium after certain (pizza, tomotoes) foods.  The pain continues until she falls to sleep.  SHe is sometimes afraid to eat.  She is shaky at times.   No NSAIDs.  She takes 3 narcotic pain meds a day (lorcet 10/650).  + pyrosis, intermittent dysphagia to solid foods, chronic.  She has never had a colonoscopy.    Bowels move fairly regularly.  No rectal bleeding.  She takes nexium in AM, about 30 min before breakfast.  Has been on ppi for 5-6 years.  Overall wieght fluctuates.    Review of systems: Pertinent positive and negative review of systems were noted in the above HPI section.  All other review of systems was otherwise negative.   Past Medical History  Diagnosis Date  . Diabetes mellitus   . Hypertension   . Obese   . Hiatal hernia     Past Surgical History  Procedure Date  . Abdominal hysterectomy   . Shoulder surgery   . Total hip arthroplasty     bilateral  . Knee arthroscopy     bilateral     reports that she has quit smoking. She has never used smokeless tobacco. She reports that she does not drink alcohol or use illicit drugs.  family history includes Diabetes in her mother; Heart disease in her brother and mother; and Kidney disease in her father.    Current Medications, Allergies were all reviewed with the patient via [facility] record system.    Physical Exam: BP 130/82  Pulse 60  Ht 5\' 2"  (1.575 m)  Wt 226 lb 6.4 oz (102.694 kg)  BMI 41.41 kg/m2 Constitutional: generally well-appearing but  obese Psychiatric: alert and oriented x3 Eyes: extraocular movements intact Mouth: oral pharynx moist, no lesions Neck: supple no lymphadenopathy Cardiovascular: heart regular rate and  rhythm Lungs: clear to auscultation bilaterally Abdomen: soft, nontender, nondistended, no obvious ascites, no peritoneal signs, normal bowel sounds Extremities: no lower extremity edema bilaterally Skin: no lesions on visible extremities    Assessment and plan: 75 y.o. female with chronic GERD, intermittent epigastric pain, routine risk for colon cancer  Pains might be biliary however I suspect it is more related to her obesity, chronic narcotic usage, perhaps gastroparesis from diabetes and narcotics. She'll stay on Nexium once daily for now we will proceed with an EGD at her soonest convenience to rule out ulcer disease, significant esophageal inflammation. Gastritis. At the same time she will have a routine screening colonoscopy. This will be best to be done with propofol sedation and she takes chronic narcotics daily.

## 2011-07-09 ENCOUNTER — Ambulatory Visit (HOSPITAL_COMMUNITY)
Admission: RE | Admit: 2011-07-09 | Discharge: 2011-07-09 | Disposition: A | Discharge status: Home / Self Care | Payer: Medicare Other | Source: Ambulatory Visit | Attending: Gastroenterology | Admitting: Gastroenterology

## 2011-07-09 DIAGNOSIS — N289 Disorder of kidney and ureter, unspecified: Secondary | ICD-10-CM | POA: Insufficient documentation

## 2011-07-09 DIAGNOSIS — K219 Gastro-esophageal reflux disease without esophagitis: Secondary | ICD-10-CM

## 2011-07-09 DIAGNOSIS — R1013 Epigastric pain: Secondary | ICD-10-CM | POA: Insufficient documentation

## 2011-07-09 DIAGNOSIS — E119 Type 2 diabetes mellitus without complications: Secondary | ICD-10-CM | POA: Insufficient documentation

## 2011-07-09 DIAGNOSIS — N200 Calculus of kidney: Secondary | ICD-10-CM | POA: Insufficient documentation

## 2011-07-10 ENCOUNTER — Telehealth: Payer: Self-pay | Admitting: *Deleted

## 2011-07-10 NOTE — Telephone Encounter (Signed)
Message copied by Lance Morin on Fri Jul 10, 2011  3:50 PM ------      Message from: Milus Banister      Created: Fri Jul 10, 2011  7:59 AM                   Please call the patient.  US showed no gallstones, but did show that a  PREVIOUSLY NOTED lesion in right kidney is larger compared with one year ago ultrasound.  She needs referral to a urologist to discuss, consider other imaging (at [facility] urology).  Should continue with plans for EGD as well.            thanks

## 2011-07-10 NOTE — Telephone Encounter (Signed)
Spoke with pt to inform her she needs a referral to [facility] d/t the known lesion on her r kidney has grown from last year. Pt stated she just saw a "kidney doctor"  A couple of months ago. Explained to pt I stated a Kidney doctor, but I mean a doctor that would look at her kidneys, bladder or a man's prostate, not a dialysis md. Pt was confused and asked that I call her sister, Stoney Bang [phone] and ask her where she went. lmom for [name] to call back.

## 2011-07-13 NOTE — Telephone Encounter (Signed)
Informed pt's sister and means of transportation, [place] at [phone], that pt needs to f/u with [facility] at [phone]. I checked with [facility] and pt saw Dr Vanetta Mulders  07/2010.  Scheduled an appt with Dr Karsten Ro for 08/11/11 at 3pm. Pt is to take a picture ID, her insurance card and a list of her meds with her. [name] stated understanding.  Sent ofc notes and other info to Dr Karsten Ro.

## 2011-08-06 ENCOUNTER — Ambulatory Visit (HOSPITAL_COMMUNITY)
Admission: RE | Admit: 2011-08-06 | Discharge: 2011-08-06 | Disposition: A | Discharge status: Home / Self Care | Payer: Medicare Other | Source: Ambulatory Visit | Attending: Gastroenterology | Admitting: Gastroenterology

## 2011-08-06 ENCOUNTER — Encounter: Payer: Medicare Other | Admitting: Gastroenterology

## 2011-08-06 DIAGNOSIS — R1013 Epigastric pain: Secondary | ICD-10-CM

## 2011-08-06 DIAGNOSIS — K3189 Other diseases of stomach and duodenum: Secondary | ICD-10-CM

## 2011-08-06 DIAGNOSIS — K449 Diaphragmatic hernia without obstruction or gangrene: Secondary | ICD-10-CM | POA: Insufficient documentation

## 2011-08-06 DIAGNOSIS — Z1211 Encounter for screening for malignant neoplasm of colon: Secondary | ICD-10-CM

## 2011-08-06 DIAGNOSIS — E119 Type 2 diabetes mellitus without complications: Secondary | ICD-10-CM | POA: Insufficient documentation

## 2011-08-06 DIAGNOSIS — K219 Gastro-esophageal reflux disease without esophagitis: Secondary | ICD-10-CM | POA: Insufficient documentation

## 2011-08-06 DIAGNOSIS — Z79899 Other long term (current) drug therapy: Secondary | ICD-10-CM | POA: Insufficient documentation

## 2011-08-06 DIAGNOSIS — Z794 Long term (current) use of insulin: Secondary | ICD-10-CM | POA: Insufficient documentation

## 2011-08-06 DIAGNOSIS — I10 Essential (primary) hypertension: Secondary | ICD-10-CM | POA: Insufficient documentation

## 2011-08-06 HISTORY — PX: COLONOSCOPY: SHX174

## 2011-08-06 LAB — CBC
HCT: 36.1 % (ref 36.0–46.0)
Hemoglobin: 12.2 g/dL (ref 12.0–15.0)
MCH: 27.6 pg (ref 26.0–34.0)
MCHC: 33.8 g/dL (ref 30.0–36.0)

## 2011-08-06 LAB — BASIC METABOLIC PANEL
BUN: 11 mg/dL (ref 6–23)
Calcium: 9.3 mg/dL (ref 8.4–10.5)
GFR calc non Af Amer: 53 mL/min — ABNORMAL LOW (ref >60)
Glucose, Bld: 160 mg/dL — ABNORMAL HIGH (ref 70–99)

## 2011-08-06 LAB — HM COLONOSCOPY: HM Colonoscopy: NORMAL

## 2011-08-24 LAB — PROTIME-INR
INR: 2.5 — ABNORMAL HIGH
Prothrombin Time: 12.7
Prothrombin Time: 14

## 2011-08-24 LAB — CBC
HCT: 27.3 — ABNORMAL LOW
HCT: 28.6 — ABNORMAL LOW
Hemoglobin: 9.2 — ABNORMAL LOW
MCHC: 33.6
MCHC: 33.6
MCHC: 33.8
MCV: 89
MCV: 89.1
RBC: 3.21 — ABNORMAL LOW
RDW: 13.9
RDW: 14.1

## 2011-08-24 LAB — URINALYSIS, ROUTINE W REFLEX MICROSCOPIC
Glucose, UA: NEGATIVE
Nitrite: NEGATIVE
Protein, ur: NEGATIVE
pH: 6

## 2011-08-24 LAB — BASIC METABOLIC PANEL
BUN: 23
CO2: 26
Chloride: 103
Creatinine, Ser: 1.88 — ABNORMAL HIGH
Glucose, Bld: 90

## 2011-08-24 LAB — TYPE AND SCREEN: ABO/RH(D): B POS

## 2011-08-24 LAB — COMPREHENSIVE METABOLIC PANEL
ALT: 13
AST: 18
Calcium: 9.7
GFR calc Af Amer: 44 — ABNORMAL LOW
Sodium: 142
Total Protein: 6.6

## 2011-08-24 LAB — ABO/RH: ABO/RH(D): B POS

## 2011-08-26 LAB — CBC
HCT: 34.9 — ABNORMAL LOW
MCV: 84.8
RBC: 4.11
WBC: 6.1

## 2011-08-26 LAB — POCT CARDIAC MARKERS
CKMB, poc: 3
Myoglobin, poc: 156

## 2011-08-26 LAB — POCT I-STAT, CHEM 8
BUN: 18
Creatinine, Ser: 1.3 — ABNORMAL HIGH
Glucose, Bld: 163 — ABNORMAL HIGH
Hemoglobin: 11.9 — ABNORMAL LOW
Potassium: 3.7
TCO2: 24

## 2011-08-26 LAB — DIFFERENTIAL
Eosinophils Absolute: 0.3
Eosinophils Relative: 5
Lymphocytes Relative: 40
Lymphs Abs: 2.4
Monocytes Relative: 8

## 2011-08-26 LAB — D-DIMER, QUANTITATIVE: D-Dimer, Quant: 2.54 — ABNORMAL HIGH

## 2012-01-01 DIAGNOSIS — M169 Osteoarthritis of hip, unspecified: Secondary | ICD-10-CM | POA: Diagnosis not present

## 2012-02-12 DIAGNOSIS — M48061 Spinal stenosis, lumbar region without neurogenic claudication: Secondary | ICD-10-CM | POA: Diagnosis not present

## 2012-02-15 ENCOUNTER — Ambulatory Visit
Admission: RE | Admit: 2012-02-15 | Discharge: 2012-02-15 | Disposition: A | Discharge status: Home / Self Care | Payer: Medicare Other | Source: Ambulatory Visit | Attending: Orthopedic Surgery | Admitting: Orthopedic Surgery

## 2012-02-15 ENCOUNTER — Other Ambulatory Visit: Payer: Self-pay | Admitting: Orthopedic Surgery

## 2012-02-15 DIAGNOSIS — M1990 Unspecified osteoarthritis, unspecified site: Secondary | ICD-10-CM

## 2012-02-15 DIAGNOSIS — N200 Calculus of kidney: Secondary | ICD-10-CM | POA: Diagnosis not present

## 2012-02-15 DIAGNOSIS — H409 Unspecified glaucoma: Secondary | ICD-10-CM | POA: Diagnosis not present

## 2012-02-15 DIAGNOSIS — H4011X0 Primary open-angle glaucoma, stage unspecified: Secondary | ICD-10-CM | POA: Diagnosis not present

## 2012-02-15 DIAGNOSIS — E119 Type 2 diabetes mellitus without complications: Secondary | ICD-10-CM | POA: Diagnosis not present

## 2012-02-15 DIAGNOSIS — M5137 Other intervertebral disc degeneration, lumbosacral region: Secondary | ICD-10-CM | POA: Diagnosis not present

## 2012-02-15 DIAGNOSIS — Z794 Long term (current) use of insulin: Secondary | ICD-10-CM | POA: Diagnosis not present

## 2012-02-28 ENCOUNTER — Inpatient Hospital Stay (HOSPITAL_COMMUNITY)
Admission: EM | Admit: 2012-02-28 | Discharge: 2012-03-02 | DRG: 291 | Disposition: A | Discharge status: Home Health | Payer: Medicare Other | Attending: Internal Medicine | Admitting: Internal Medicine

## 2012-02-28 ENCOUNTER — Emergency Department (HOSPITAL_COMMUNITY): Payer: Medicare Other

## 2012-02-28 ENCOUNTER — Other Ambulatory Visit: Payer: Self-pay

## 2012-02-28 ENCOUNTER — Encounter (HOSPITAL_COMMUNITY): Payer: Self-pay | Admitting: *Deleted

## 2012-02-28 ENCOUNTER — Other Ambulatory Visit (HOSPITAL_COMMUNITY): Payer: Self-pay | Admitting: Pharmacy Technician

## 2012-02-28 DIAGNOSIS — E1169 Type 2 diabetes mellitus with other specified complication: Secondary | ICD-10-CM

## 2012-02-28 DIAGNOSIS — J984 Other disorders of lung: Secondary | ICD-10-CM | POA: Diagnosis not present

## 2012-02-28 DIAGNOSIS — R0602 Shortness of breath: Secondary | ICD-10-CM | POA: Diagnosis not present

## 2012-02-28 DIAGNOSIS — R269 Unspecified abnormalities of gait and mobility: Secondary | ICD-10-CM | POA: Diagnosis not present

## 2012-02-28 DIAGNOSIS — M1990 Unspecified osteoarthritis, unspecified site: Secondary | ICD-10-CM

## 2012-02-28 DIAGNOSIS — E669 Obesity, unspecified: Secondary | ICD-10-CM | POA: Diagnosis not present

## 2012-02-28 DIAGNOSIS — E119 Type 2 diabetes mellitus without complications: Secondary | ICD-10-CM | POA: Diagnosis present

## 2012-02-28 DIAGNOSIS — J189 Pneumonia, unspecified organism: Secondary | ICD-10-CM

## 2012-02-28 DIAGNOSIS — I5033 Acute on chronic diastolic (congestive) heart failure: Principal | ICD-10-CM | POA: Diagnosis present

## 2012-02-28 DIAGNOSIS — R062 Wheezing: Secondary | ICD-10-CM

## 2012-02-28 DIAGNOSIS — M129 Arthropathy, unspecified: Secondary | ICD-10-CM | POA: Diagnosis present

## 2012-02-28 DIAGNOSIS — R072 Precordial pain: Secondary | ICD-10-CM | POA: Diagnosis not present

## 2012-02-28 DIAGNOSIS — E876 Hypokalemia: Secondary | ICD-10-CM | POA: Diagnosis not present

## 2012-02-28 DIAGNOSIS — R05 Cough: Secondary | ICD-10-CM | POA: Diagnosis not present

## 2012-02-28 DIAGNOSIS — Z6837 Body mass index (BMI) 37.0-37.9, adult: Secondary | ICD-10-CM | POA: Diagnosis not present

## 2012-02-28 DIAGNOSIS — R911 Solitary pulmonary nodule: Secondary | ICD-10-CM | POA: Diagnosis not present

## 2012-02-28 DIAGNOSIS — I509 Heart failure, unspecified: Secondary | ICD-10-CM | POA: Diagnosis present

## 2012-02-28 DIAGNOSIS — J9819 Other pulmonary collapse: Secondary | ICD-10-CM | POA: Diagnosis not present

## 2012-02-28 DIAGNOSIS — J159 Unspecified bacterial pneumonia: Secondary | ICD-10-CM | POA: Diagnosis not present

## 2012-02-28 DIAGNOSIS — R059 Cough, unspecified: Secondary | ICD-10-CM | POA: Diagnosis not present

## 2012-02-28 HISTORY — DX: Scoliosis, unspecified: M41.9

## 2012-02-28 HISTORY — DX: Unspecified osteoarthritis, unspecified site: M19.90

## 2012-02-28 LAB — BASIC METABOLIC PANEL
CO2: 29 mEq/L (ref 19–32)
Chloride: 103 mEq/L (ref 96–112)
Creatinine, Ser: 1.3 mg/dL — ABNORMAL HIGH (ref 0.50–1.10)
Glucose, Bld: 139 mg/dL — ABNORMAL HIGH (ref 70–99)

## 2012-02-28 LAB — DIFFERENTIAL
Basophils Absolute: 0 10*3/uL (ref 0.0–0.1)
Lymphocytes Relative: 31 % (ref 12–46)
Lymphs Abs: 2.2 10*3/uL (ref 0.7–4.0)
Monocytes Absolute: 0.4 10*3/uL (ref 0.1–1.0)
Monocytes Relative: 5 % (ref 3–12)
Neutro Abs: 4.5 10*3/uL (ref 1.7–7.7)

## 2012-02-28 LAB — CBC
HCT: 34.5 % — ABNORMAL LOW (ref 36.0–46.0)
Hemoglobin: 11.5 g/dL — ABNORMAL LOW (ref 12.0–15.0)
RBC: 4.16 MIL/uL (ref 3.87–5.11)
WBC: 7.3 10*3/uL (ref 4.0–10.5)

## 2012-02-28 LAB — MAGNESIUM: Magnesium: 1.1 mg/dL — ABNORMAL LOW (ref 1.5–2.5)

## 2012-02-28 LAB — PRO B NATRIURETIC PEPTIDE: Pro B Natriuretic peptide (BNP): 210.6 pg/mL — ABNORMAL HIGH (ref 0–125)

## 2012-02-28 LAB — CARDIAC PANEL(CRET KIN+CKTOT+MB+TROPI)
CK, MB: 6.4 ng/mL (ref 0.3–4.0)
Total CK: 438 U/L — ABNORMAL HIGH (ref 7–177)

## 2012-02-28 LAB — GLUCOSE, CAPILLARY: Glucose-Capillary: 208 mg/dL — ABNORMAL HIGH (ref 70–99)

## 2012-02-28 MED ORDER — ACETAMINOPHEN 325 MG PO TABS: 650.0000 mg | ORAL_TABLET | Freq: Four times a day (QID) | ORAL | Status: DC | PRN

## 2012-02-28 MED ORDER — POTASSIUM CHLORIDE CRYS ER 20 MEQ PO TBCR
40.0000 meq | EXTENDED_RELEASE_TABLET | Freq: Two times a day (BID) | ORAL | Status: DC
  Administered 2012-02-28: 40 meq via ORAL
  Filled 2012-02-28 (×3): qty 2

## 2012-02-28 MED ORDER — ONDANSETRON HCL 4 MG PO TABS: 4.0000 mg | ORAL_TABLET | Freq: Four times a day (QID) | ORAL | Status: DC | PRN

## 2012-02-28 MED ORDER — HYDROCODONE-ACETAMINOPHEN 10-325 MG PO TABS: 1.0000 | ORAL_TABLET | Freq: Four times a day (QID) | ORAL | Status: DC | PRN

## 2012-02-28 MED ORDER — MOXIFLOXACIN HCL IN NACL 400 MG/250ML IV SOLN
400.0000 mg | Freq: Once | INTRAVENOUS | Status: AC
  Administered 2012-02-28: 400 mg via INTRAVENOUS
  Filled 2012-02-28: qty 250

## 2012-02-28 MED ORDER — METOPROLOL SUCCINATE ER 100 MG PO TB24
100.0000 mg | ORAL_TABLET | Freq: Every day | ORAL | Status: DC
  Administered 2012-02-29 – 2012-03-02 (×3): 100 mg via ORAL
  Filled 2012-02-28 (×3): qty 1

## 2012-02-28 MED ORDER — ALBUTEROL SULFATE (5 MG/ML) 0.5% IN NEBU
INHALATION_SOLUTION | RESPIRATORY_TRACT | Status: AC
  Filled 2012-02-28: qty 2.5

## 2012-02-28 MED ORDER — MOXIFLOXACIN HCL IN NACL 400 MG/250ML IV SOLN
400.0000 mg | INTRAVENOUS | Status: DC
  Administered 2012-02-29 – 2012-03-01 (×2): 400 mg via INTRAVENOUS
  Filled 2012-02-28 (×3): qty 250

## 2012-02-28 MED ORDER — ASPIRIN EC 81 MG PO TBEC
81.0000 mg | DELAYED_RELEASE_TABLET | Freq: Every day | ORAL | Status: DC
  Administered 2012-02-29 – 2012-03-02 (×3): 81 mg via ORAL
  Filled 2012-02-28 (×3): qty 1

## 2012-02-28 MED ORDER — SODIUM CHLORIDE 0.9 % IV SOLN
INTRAVENOUS | Status: AC
  Administered 2012-02-28: 21:00:00 via INTRAVENOUS

## 2012-02-28 MED ORDER — ALBUTEROL (5 MG/ML) CONTINUOUS INHALATION SOLN
10.0000 mg/h | INHALATION_SOLUTION | Freq: Once | RESPIRATORY_TRACT | Status: AC
  Administered 2012-02-28: 10 mg/h via RESPIRATORY_TRACT

## 2012-02-28 MED ORDER — IPRATROPIUM BROMIDE 0.02 % IN SOLN
0.5000 mg | Freq: Four times a day (QID) | RESPIRATORY_TRACT | Status: DC
  Administered 2012-02-28: 0.5 mg via RESPIRATORY_TRACT
  Filled 2012-02-28: qty 2.5

## 2012-02-28 MED ORDER — GUAIFENESIN ER 600 MG PO TB12
600.0000 mg | ORAL_TABLET | Freq: Two times a day (BID) | ORAL | Status: DC
  Administered 2012-02-28 – 2012-03-02 (×6): 600 mg via ORAL
  Filled 2012-02-28 (×8): qty 1

## 2012-02-28 MED ORDER — ACETAMINOPHEN 650 MG RE SUPP: 650.0000 mg | Freq: Four times a day (QID) | RECTAL | Status: DC | PRN

## 2012-02-28 MED ORDER — SIMVASTATIN 20 MG PO TABS
20.0000 mg | ORAL_TABLET | Freq: Every day | ORAL | Status: DC
  Administered 2012-02-28 – 2012-03-01 (×3): 20 mg via ORAL
  Filled 2012-02-28 (×4): qty 1

## 2012-02-28 MED ORDER — ENOXAPARIN SODIUM 40 MG/0.4ML ~~LOC~~ SOLN
40.0000 mg | SUBCUTANEOUS | Status: DC
  Administered 2012-02-29 – 2012-03-01 (×2): 40 mg via SUBCUTANEOUS
  Filled 2012-02-28 (×4): qty 0.4

## 2012-02-28 MED ORDER — FUROSEMIDE 10 MG/ML IJ SOLN
40.0000 mg | Freq: Once | INTRAMUSCULAR | Status: AC
  Administered 2012-02-28: 40 mg via INTRAVENOUS
  Filled 2012-02-28: qty 4

## 2012-02-28 MED ORDER — ALBUTEROL SULFATE (5 MG/ML) 0.5% IN NEBU
2.5000 mg | INHALATION_SOLUTION | RESPIRATORY_TRACT | Status: AC
  Administered 2012-02-28 – 2012-02-29 (×3): 2.5 mg via RESPIRATORY_TRACT
  Filled 2012-02-28 (×3): qty 0.5

## 2012-02-28 MED ORDER — INSULIN ASPART PROT & ASPART (70-30 MIX) 100 UNIT/ML ~~LOC~~ SUSP
35.0000 [IU] | Freq: Every day | SUBCUTANEOUS | Status: DC
  Administered 2012-02-29 – 2012-03-02 (×3): 35 [IU] via SUBCUTANEOUS
  Filled 2012-02-28: qty 3

## 2012-02-28 MED ORDER — PANTOPRAZOLE SODIUM 40 MG PO TBEC
80.0000 mg | DELAYED_RELEASE_TABLET | Freq: Every day | ORAL | Status: DC
  Administered 2012-02-28 – 2012-03-02 (×4): 80 mg via ORAL
  Filled 2012-02-28 (×4): qty 2

## 2012-02-28 MED ORDER — AMLODIPINE BESYLATE 10 MG PO TABS
10.0000 mg | ORAL_TABLET | Freq: Every day | ORAL | Status: DC
  Administered 2012-02-29 – 2012-03-02 (×3): 10 mg via ORAL
  Filled 2012-02-28 (×3): qty 1

## 2012-02-28 MED ORDER — LEVALBUTEROL HCL 0.63 MG/3ML IN NEBU
0.6300 mg | INHALATION_SOLUTION | Freq: Four times a day (QID) | RESPIRATORY_TRACT | Status: DC
  Filled 2012-02-28 (×6): qty 3

## 2012-02-28 MED ORDER — SERTRALINE HCL 50 MG PO TABS
50.0000 mg | ORAL_TABLET | Freq: Every day | ORAL | Status: DC
  Administered 2012-02-28 – 2012-03-02 (×4): 50 mg via ORAL
  Filled 2012-02-28 (×4): qty 1

## 2012-02-28 MED ORDER — TRAMADOL HCL 50 MG PO TABS
50.0000 mg | ORAL_TABLET | Freq: Four times a day (QID) | ORAL | Status: DC | PRN
  Administered 2012-02-28 – 2012-03-02 (×9): 50 mg via ORAL
  Filled 2012-02-28 (×9): qty 1

## 2012-02-28 MED ORDER — ONDANSETRON HCL 4 MG/2ML IJ SOLN: 4.0000 mg | Freq: Three times a day (TID) | INTRAMUSCULAR | Status: AC | PRN

## 2012-02-28 MED ORDER — HYDROCODONE-ACETAMINOPHEN 5-325 MG PO TABS
2.0000 | ORAL_TABLET | Freq: Once | ORAL | Status: DC
  Filled 2012-02-28: qty 2

## 2012-02-28 MED ORDER — ALBUTEROL SULFATE (5 MG/ML) 0.5% IN NEBU
5.0000 mg | INHALATION_SOLUTION | Freq: Once | RESPIRATORY_TRACT | Status: AC
  Administered 2012-02-28: 5 mg via RESPIRATORY_TRACT
  Filled 2012-02-28: qty 1

## 2012-02-28 MED ORDER — GUAIFENESIN-DM 100-10 MG/5ML PO SYRP: 5.0000 mL | ORAL_SOLUTION | ORAL | Status: DC | PRN

## 2012-02-28 MED ORDER — BENAZEPRIL HCL 40 MG PO TABS
40.0000 mg | ORAL_TABLET | Freq: Every day | ORAL | Status: DC
  Administered 2012-02-29 – 2012-03-02 (×3): 40 mg via ORAL
  Filled 2012-02-28 (×3): qty 1

## 2012-02-28 MED ORDER — ONDANSETRON HCL 4 MG/2ML IJ SOLN: 4.0000 mg | Freq: Four times a day (QID) | INTRAMUSCULAR | Status: DC | PRN

## 2012-02-28 MED ORDER — POTASSIUM CHLORIDE 10 MEQ/100ML IV SOLN
10.0000 meq | INTRAVENOUS | Status: AC
  Administered 2012-02-28: 10 meq via INTRAVENOUS
  Filled 2012-02-28: qty 100

## 2012-02-28 MED ORDER — PREDNISONE 20 MG PO TABS
60.0000 mg | ORAL_TABLET | Freq: Once | ORAL | Status: AC
  Administered 2012-02-28: 60 mg via ORAL
  Filled 2012-02-28: qty 3

## 2012-02-28 MED ORDER — POTASSIUM CHLORIDE CRYS ER 20 MEQ PO TBCR
40.0000 meq | EXTENDED_RELEASE_TABLET | Freq: Once | ORAL | Status: AC
  Administered 2012-02-28: 40 meq via ORAL
  Filled 2012-02-28: qty 2

## 2012-02-28 NOTE — Progress Notes (Signed)
CRITICAL VALUE ALERT  Critical value received:  CK-MB 6.4  Date of notification:  [id number]  Time of notification:  2045  Critical value read back:yes  Nurse who received alert:  Star Age, RN  MD notified (1st page):  M. Donnal Debar, NP  Time of first page:  2117  MD notified (2nd page):  Time of second page:  Responding MD: M. Donnal Debar, NP  Time MD responded:  2211

## 2012-02-28 NOTE — ED Notes (Signed)
Attempted to call report to Leonard Downing, RN unable, will try again in a few minutes.

## 2012-02-28 NOTE — ED Provider Notes (Signed)
History     CSN: [id number]  Arrival date & time 02/28/12  1419   First MD Initiated Contact with Patient 02/28/12 1544      Chief Complaint  Patient presents with  . Cough  . Shortness of Breath  . Chest Pain    (Consider location/radiation/quality/duration/timing/severity/associated sxs/prior treatment) Patient is a 76 y.o. female presenting with cough. The history is provided by the patient. No language interpreter was used.  Cough This is a new problem. The current episode started more than 1 week ago (2 weeks ago). The problem has been gradually worsening. The cough is non-productive. There has been no fever. Associated symptoms include chest pain, shortness of breath and wheezing. Pertinent negatives include no chills, no sweats, no weight loss, no ear congestion, no ear pain, no headaches, no rhinorrhea, no sore throat and no myalgias. She has tried nothing for the symptoms. The treatment provided no relief. She is not a smoker.    Past Medical History  Diagnosis Date  . Diabetes mellitus   . Hypertension   . Obese   . Hiatal hernia   . Scoliosis   . Arthritis     Past Surgical History  Procedure Date  . Abdominal hysterectomy   . Shoulder surgery   . Total hip arthroplasty     bilateral  . Knee arthroscopy     bilateral    Family History  Problem Relation Age of Onset  . Diabetes Mother   . Heart disease Mother   . Heart disease Brother   . Kidney disease Father     History  Substance Use Topics  . Smoking status: Former Research scientist (life sciences)  . Smokeless tobacco: Never Used  . Alcohol Use: No    OB History    Grav Para Term Preterm Abortions TAB SAB Ect Mult Living                  Review of Systems  Constitutional: Negative for fever, chills, weight loss, activity change, appetite change and fatigue.  HENT: Positive for congestion. Negative for ear pain, sore throat, rhinorrhea, neck pain and neck stiffness.   Respiratory: Positive for cough, shortness of  breath and wheezing.   Cardiovascular: Positive for chest pain. Negative for palpitations.  Gastrointestinal: Negative for nausea, vomiting, abdominal pain and diarrhea.  Genitourinary: Negative for dysuria, urgency, frequency and flank pain.  Musculoskeletal: Negative for myalgias, back pain and arthralgias.  Neurological: Negative for dizziness, weakness, light-headedness, numbness and headaches.  All other systems reviewed and are negative.    Allergies  Codeine; Oxycodone; and Penicillins  Home Medications   Current Outpatient Rx  Name Route Sig Dispense Refill  . AMLODIPINE BESYLATE 10 MG PO TABS Oral Take 10 mg by mouth daily.      . ASPIRIN 81 MG PO TABS Oral Take 81 mg by mouth daily.      Marland Kitchen BENAZEPRIL HCL 40 MG PO TABS Oral Take 40 mg by mouth daily.      Marland Kitchen ESOMEPRAZOLE MAGNESIUM 40 MG PO CPDR Oral Take 40 mg by mouth daily before breakfast.      . FUROSEMIDE 20 MG PO TABS Oral Take 20 mg by mouth daily.     Marland Kitchen HYDROCODONE-ACETAMINOPHEN 10-650 MG PO TABS Oral Take 1 tablet by mouth every 6 (six) hours as needed. For pain    . INSULIN ASPART PROT & ASPART (70-30) 100 UNIT/ML [place] SUSP Subcutaneous Inject 35 Units into the skin daily with breakfast. 25 units at night     .  METFORMIN HCL 1000 MG PO TABS Oral Take 1,000 mg by mouth 2 (two) times daily with a meal.      . METOPROLOL SUCCINATE ER 100 MG PO TB24 Oral Take 100 mg by mouth daily.      Marland Kitchen PEG-KCL-NACL-NASULF-NA ASC-C 100 G PO SOLR Oral Take 1 kit (100 g total) by mouth once. 1 kit 0  . POTASSIUM CHLORIDE 10 MEQ PO TBCR Oral Take 10 mEq by mouth daily.      . SERTRALINE HCL 50 MG PO TABS Oral Take 50 mg by mouth daily.      Marland Kitchen SIMVASTATIN 20 MG PO TABS Oral Take 20 mg by mouth at bedtime.        BP 147/71  Pulse 69  Temp(Src) 98.5 F (36.9 C) (Oral)  Resp 22  SpO2 94%  Physical Exam  Nursing note and vitals reviewed. Constitutional: She is oriented to person, place, and time. She appears well-developed and  well-nourished. No distress.  HENT:  Head: Normocephalic and atraumatic.  Mouth/Throat: Oropharynx is clear and moist.  Eyes: Conjunctivae and EOM are normal. Pupils are equal, round, and reactive to light.  Neck: Normal range of motion. Neck supple.  Cardiovascular: Normal rate, regular rhythm, normal heart sounds and intact distal pulses.  Exam reveals no gallop and no friction rub.   No murmur heard. Pulmonary/Chest: No respiratory distress. She has wheezes (diffuse). She exhibits tenderness (diffuse tenderness from coughing).  Abdominal: Soft. Bowel sounds are normal. There is no tenderness. There is no rebound and no guarding.  Musculoskeletal: Normal range of motion. She exhibits no edema and no tenderness.  Neurological: She is alert and oriented to person, place, and time. No cranial nerve deficit.  Skin: Skin is warm and dry. No rash noted.    ED Course  Procedures (including critical care time)  Labs Reviewed  CBC - Abnormal; Notable for the following:    Hemoglobin 11.5 (*)    HCT 34.5 (*)    All other components within normal limits  BASIC METABOLIC PANEL - Abnormal; Notable for the following:    Potassium 2.9 (*)    Glucose, Bld 139 (*)    Creatinine, Ser 1.30 (*)    GFR calc non Af Amer 40 (*)    GFR calc Af Amer 46 (*)    All other components within normal limits  DIFFERENTIAL   Dg Chest 2 View  02/28/2012  *RADIOLOGY REPORT*  Clinical Data: Cough.  CHEST - 2 VIEW  Comparison: 12/27/2009.  Findings: Cardiomegaly.  Pulmonary vascular congestion.  Basilar atelectasis. No focal consolidation.  No effusion.  Technically suboptimal study due to patient body habitus.  On the lateral view, there is density over the lower thoracic spine favored to represent atelectasis however pneumonia cannot be excluded.  Stable eventration of the right hemidiaphragm.  IMPRESSION: Patchy lower lobe density on the lateral view is favored to represent atelectasis although pneumonia or  aspiration could have a similar appearance.  Original Report Authenticated By: Dereck Ligas, M.D.     1. PNA (pneumonia)   2. Hypokalemia   3. Wheezing       MDM  Wheezing likely secondary to community-acquired pneumonia. Was placed on Avelox. She received a continuous albuterol treatment as well as an additional albuterol with continued wheezing although improvement. She currently does not have an oxygen requirement. Hypokalemia partially secondary to albuterol. She was placed on IV potassium as well as oral potassium. This will likely continue to drop given the necessary  to be dry. Just received steroids. Will be admitted the hospital for further evaluation and treatment        Trisha Mangle, MD 02/28/12 [id number]

## 2012-02-28 NOTE — H&P (Addendum)
Michelle Buck MRN: [id number] DOB/AGE: 1936/01/09 76 y.o. Primary Care Physician:No primary provider on file. Admit date: 02/28/2012 Chief Complaint:sob HPI: 76 y.o. female presenting with cough. The history is provided by the patient. She also has substernal chest pain associated with the cough.  The current episode started more than 1 week ago (2 weeks ago).  The cough is non-productive. There has been no fever. Associated symptoms include chest pain, shortness of breath and wheezing. Pertinent negatives include no chills, no sweats, no weight loss, no ear congestion, no ear pain, no headaches, no rhinorrhea, no sore throat and no myalgias. She has tried nothing for the symptoms. The treatment provided no relief. She is not a smoker.    Past Medical History  Diagnosis Date  . Diabetes mellitus   . Hypertension   . Obese   . Hiatal hernia   . Scoliosis   . Arthritis     Past Surgical History  Procedure Date  . Abdominal hysterectomy   . Shoulder surgery   . Total hip arthroplasty     bilateral  . Knee arthroscopy     bilateral    Prior to Admission medications   Medication Sig Start Date End Date Taking? Authorizing Provider  amLODipine (NORVASC) 10 MG tablet Take 10 mg by mouth daily.     Yes Historical Provider, MD  aspirin 81 MG tablet Take 81 mg by mouth daily.     Yes Historical Provider, MD  benazepril (LOTENSIN) 40 MG tablet Take 40 mg by mouth daily.     Yes Historical Provider, MD  esomeprazole (NEXIUM) 40 MG capsule Take 40 mg by mouth daily before breakfast.     Yes Historical Provider, MD  furosemide (LASIX) 20 MG tablet Take 20 mg by mouth daily.    Yes Historical Provider, MD  HYDROcodone-acetaminophen (LORCET) 10-650 MG per tablet Take 1 tablet by mouth every 6 (six) hours as needed. For pain   Yes Historical Provider, MD  insulin aspart protamine-insulin aspart (NOVOLOG 70/30) (70-30) 100 UNIT/ML injection Inject 35 Units into the skin daily with breakfast. 25 units  at night    Yes Historical Provider, MD  metFORMIN (GLUCOPHAGE) 1000 MG tablet Take 1,000 mg by mouth 2 (two) times daily with a meal.     Yes Historical Provider, MD  metoprolol (TOPROL-XL) 100 MG 24 hr tablet Take 100 mg by mouth daily.     Yes Historical Provider, MD  peg 3350 powder (MOVIPREP) 100 G SOLR Take 1 kit (100 g total) by mouth once. 07/07/11  Yes Milus Banister, MD  potassium chloride (KLOR-CON) 10 MEQ CR tablet Take 10 mEq by mouth daily.     Yes Historical Provider, MD  sertraline (ZOLOFT) 50 MG tablet Take 50 mg by mouth daily.     Yes Historical Provider, MD  simvastatin (ZOCOR) 20 MG tablet Take 20 mg by mouth at bedtime.     Yes Historical Provider, MD    Allergies:  Allergies  Allergen Reactions  . Codeine Nausea And Vomiting    Patient states N/V with codeine  . Oxycodone Other (See Comments)    Patient states she can tolerate oxycodone  . Penicillins Rash    Patient states rash/itch with penicillin    Family History  Problem Relation Age of Onset  . Diabetes Mother   . Heart disease Mother   . Heart disease Brother   . Kidney disease Father     Social History:  reports that she has quit smoking.  She has never used smokeless tobacco. She reports that she does not drink alcohol or use illicit drugs.   [id number]: Negative for fever, chills, weight loss, activity change, appetite change and fatigue.  HENT: Positive for congestion. Negative for ear pain, sore throat, rhinorrhea, neck pain and neck stiffness.  Respiratory: Positive for cough, shortness of breath and wheezing.  Cardiovascular: Positive for chest pain. Negative for palpitations.  Gastrointestinal: Negative for nausea, vomiting, abdominal pain and diarrhea.  Genitourinary: Negative for dysuria, urgency, frequency and flank pain.  Musculoskeletal: Negative for myalgias, back pain and arthralgias.  Neurological: Negative for dizziness, weakness, light-headedness, numbness and headaches.    All other systems reviewed and are negative  PHYSICAL EXAM: Blood pressure 147/71, pulse 69, temperature 98.5 F (36.9 C), temperature source Oral, resp. rate 22, SpO2 94.00%. Constitutional: She is oriented to person, place, and time. She appears well-developed and well-nourished. No distress.  HENT:  Head: Normocephalic and atraumatic.  Mouth/Throat: Oropharynx is clear and moist.  Eyes: Conjunctivae and EOM are normal. Pupils are equal, round, and reactive to light.  Neck: Normal range of motion. Neck supple.  Cardiovascular: Normal rate, regular rhythm, normal heart sounds and intact distal pulses. Exam reveals no gallop and no friction rub.  No murmur heard.  Pulmonary/Chest: No respiratory distress. She has wheezes (diffuse). She exhibits tenderness (diffuse tenderness from coughing).  Abdominal: Soft. Bowel sounds are normal. There is no tenderness. There is no rebound and no guarding.  Musculoskeletal: Normal range of motion. She exhibits no edema and no tenderness.  Neurological: She is alert and oriented to person, place, and time. No cranial nerve deficit.  Skin: Skin is warm and dry. No rash noted.    No results found for this or any previous visit (from the past 240 hour(s)).   Results for orders placed during the hospital encounter of 02/28/12 (from the past 48 hour(s))  CBC     Status: Abnormal   Collection Time   02/28/12  4:01 PM      Component Value Range Comment   WBC 7.3  4.0 - 10.5 (K/uL)    RBC 4.16  3.87 - 5.11 (MIL/uL)    Hemoglobin 11.5 (*) 12.0 - 15.0 (g/dL)    HCT 34.5 (*) 36.0 - 46.0 (%)    MCV 82.9  78.0 - 100.0 (fL)    MCH 27.6  26.0 - 34.0 (pg)    MCHC 33.3  30.0 - 36.0 (g/dL)    RDW 14.1  11.5 - 15.5 (%)    Platelets 223  150 - 400 (K/uL)   DIFFERENTIAL     Status: Normal   Collection Time   02/28/12  4:01 PM      Component Value Range Comment   Neutrophils Relative 61  43 - 77 (%)    Neutro Abs 4.5  1.7 - 7.7 (K/uL)    Lymphocytes Relative 31   12 - 46 (%)    Lymphs Abs 2.2  0.7 - 4.0 (K/uL)    Monocytes Relative 5  3 - 12 (%)    Monocytes Absolute 0.4  0.1 - 1.0 (K/uL)    Eosinophils Relative 3  0 - 5 (%)    Eosinophils Absolute 0.2  0.0 - 0.7 (K/uL)    Basophils Relative 0  0 - 1 (%)    Basophils Absolute 0.0  0.0 - 0.1 (K/uL)   BASIC METABOLIC PANEL     Status: Abnormal   Collection Time   02/28/12  4:01 PM  Component Value Range Comment   Sodium 142  135 - 145 (mEq/L)    Potassium 2.9 (*) 3.5 - 5.1 (mEq/L)    Chloride 103  96 - 112 (mEq/L)    CO2 29  19 - 32 (mEq/L)    Glucose, Bld 139 (*) 70 - 99 (mg/dL)    BUN 15  6 - 23 (mg/dL)    Creatinine, Ser 1.30 (*) 0.50 - 1.10 (mg/dL)    Calcium 8.7  8.4 - 10.5 (mg/dL)    GFR calc non Af Amer 40 (*) >90 (mL/min)    GFR calc Af Amer 46 (*) >90 (mL/min)     Dg Chest 2 View  02/28/2012  *RADIOLOGY REPORT*  Clinical Data: Cough.  CHEST - 2 VIEW  Comparison: 12/27/2009.  Findings: Cardiomegaly.  Pulmonary vascular congestion.  Basilar atelectasis. No focal consolidation.  No effusion.  Technically suboptimal study due to patient body habitus.  On the lateral view, there is density over the lower thoracic spine favored to represent atelectasis however pneumonia cannot be excluded.  Stable eventration of the right hemidiaphragm.  IMPRESSION: Patchy lower lobe density on the lateral view is favored to represent atelectasis although pneumonia or aspiration could have a similar appearance.  Original Report Authenticated By: Dereck Ligas, M.D.   Dg Lumbar Spine Complete  02/15/2012  *RADIOLOGY REPORT*  Clinical Data: Low back pain radiating down both legs  LUMBAR SPINE - COMPLETE 4+ VIEW  Comparison: Lumbar spine films of 02/13/2010  Findings: Moderate lumbar scoliosis convex to the right is stable. Degenerative disc disease at L1-2, L2-3, L3-4, L4-5, and L5-S1 appears relatively stable with loss of disc space, sclerosis, and vacuum disc phenomena at multiple levels.  No compression  deformity is seen.  A right renal calculus is stable.  Bilateral total hip replacements are noted.  IMPRESSION:  1.  Stable lumbar scoliosis convex to the right with diffuse degenerative disc disease throughout the lumbar spine.  No acute abnormality. 2.  No change in the right renal calculus.  Original Report Authenticated By: Joretta Bachelor, M.D.    Impression:  Principal Problem:  *PNA (pneumonia) Diabetes  Hypokalemia Chest pain r/o ACS HTN     Plan:  Admit to Telemetry  R/o PE D-dimer CXR shows PNA  Cycle enzymes  repleat potassium Check MG  Full code    [facility] 02/28/2012, 6:01 PM

## 2012-02-28 NOTE — ED Notes (Signed)
Pt reports chest pain in the center. Pt also reports cough and shortness of breath. Pt reports symptoms started two weeks ago and felt like she was improving, but symptoms became worse on Saturday.  Pt denies fever, N/V/D.  Pt has tried OTC Tussinex without relief.

## 2012-02-28 NOTE — Progress Notes (Addendum)
Jonette Eva, NP notified of D-dimer results. No new orders obtained. Will continue to monitor.

## 2012-02-29 ENCOUNTER — Encounter (HOSPITAL_COMMUNITY): Payer: Self-pay | Admitting: Radiology

## 2012-02-29 ENCOUNTER — Inpatient Hospital Stay (HOSPITAL_COMMUNITY): Payer: Medicare Other

## 2012-02-29 DIAGNOSIS — R0602 Shortness of breath: Secondary | ICD-10-CM

## 2012-02-29 LAB — CARDIAC PANEL(CRET KIN+CKTOT+MB+TROPI)
CK, MB: 7.2 ng/mL (ref 0.3–4.0)
Relative Index: 1.4 (ref 0.0–2.5)
Relative Index: 1.4 (ref 0.0–2.5)
Troponin I: <0.3 ng/mL (ref <0.30)

## 2012-02-29 LAB — HEMOGLOBIN A1C
Hgb A1c MFr Bld: 7 % — ABNORMAL HIGH (ref <5.7)
Mean Plasma Glucose: 154 mg/dL — ABNORMAL HIGH (ref <117)

## 2012-02-29 LAB — CBC
HCT: 35.9 % — ABNORMAL LOW (ref 36.0–46.0)
Hemoglobin: 11.9 g/dL — ABNORMAL LOW (ref 12.0–15.0)
MCHC: 33.1 g/dL (ref 30.0–36.0)
MCV: 82.3 fL (ref 78.0–100.0)

## 2012-02-29 LAB — GLUCOSE, CAPILLARY
Glucose-Capillary: 123 mg/dL — ABNORMAL HIGH (ref 70–99)
Glucose-Capillary: 160 mg/dL — ABNORMAL HIGH (ref 70–99)

## 2012-02-29 LAB — TSH: TSH: 1.033 u[IU]/mL (ref 0.350–4.500)

## 2012-02-29 LAB — COMPREHENSIVE METABOLIC PANEL
ALT: 9 U/L (ref 0–35)
AST: 18 U/L (ref 0–37)
Albumin: 3.4 g/dL — ABNORMAL LOW (ref 3.5–5.2)
Calcium: 8.9 mg/dL (ref 8.4–10.5)
GFR calc Af Amer: 53 mL/min — ABNORMAL LOW (ref >90)
Glucose, Bld: 249 mg/dL — ABNORMAL HIGH (ref 70–99)
Sodium: 139 mEq/L (ref 135–145)
Total Protein: 7.4 g/dL (ref 6.0–8.3)

## 2012-02-29 MED ORDER — IOHEXOL 300 MG/ML  SOLN
100.0000 mL | Freq: Once | INTRAMUSCULAR | Status: AC | PRN
  Administered 2012-02-29: 100 mL via INTRAVENOUS

## 2012-02-29 MED ORDER — VITAMINS A & D EX OINT
TOPICAL_OINTMENT | CUTANEOUS | Status: AC
  Administered 2012-02-29: 5
  Filled 2012-02-29: qty 10

## 2012-02-29 MED ORDER — INSULIN ASPART 100 UNIT/ML ~~LOC~~ SOLN
0.0000 [IU] | Freq: Three times a day (TID) | SUBCUTANEOUS | Status: DC
  Administered 2012-02-29 – 2012-03-01 (×3): 2 [IU] via SUBCUTANEOUS
  Administered 2012-03-01: 3 [IU] via SUBCUTANEOUS
  Administered 2012-03-02 (×2): 2 [IU] via SUBCUTANEOUS

## 2012-02-29 MED ORDER — IPRATROPIUM BROMIDE 0.02 % IN SOLN
0.5000 mg | Freq: Four times a day (QID) | RESPIRATORY_TRACT | Status: DC
  Administered 2012-02-29 – 2012-03-01 (×8): 0.5 mg via RESPIRATORY_TRACT
  Filled 2012-02-29 (×8): qty 2.5

## 2012-02-29 NOTE — Progress Notes (Signed)
Pt c/o 6/10 pain in bilateral lower extremities. I offered pt Norco, and pt stated " I can't take this medication d/t an allergy to hydrocodone and Tylenol.I ask the pt what she took at home for pain, and pt stated " Loratab." I explained to the pt that Loratab was hydrocodone and Tylenol and pt still refused to take the medication. Jonette Eva, NP notified and order for Tramadol placed in Epic. . Will continue to monitor.

## 2012-02-29 NOTE — Progress Notes (Signed)
VASCULAR LAB PRELIMINARY  PRELIMINARY  PRELIMINARY  PRELIMINARY   Bilateral lower extremity venous duplex has been completed.    Preliminary report:  Negative for deep and superficial vein thrombosis bilaterally.  Margarette Canada,  RVT 02/29/2012, 10:12 AM

## 2012-02-29 NOTE — Progress Notes (Signed)
Pt lying in bed with no c/o pain. Will continue to monitor.

## 2012-02-29 NOTE — Progress Notes (Signed)
Subjective: Doing better today Denies chest pain  Objective: Vital signs in last 24 hours: Filed Vitals:   02/28/12 2028 02/28/12 2144 02/28/12 2356 02/29/12 0456  BP: 167/84   154/68  Pulse: 69   68  Temp: 98.3 F (36.8 C)   97.7 F (36.5 C)  TempSrc: Oral   Oral  Resp: 20   16  Height: 5\' 5"  (1.651 m)     Weight: 102.7 kg (226 lb 6.6 oz)     SpO2: 95% 93% 94% 96%    Intake/Output Summary (Last 24 hours) at 02/29/12 [id number] Last data filed at 02/29/12 0457  Gross per 24 hour  Intake    160 ml  Output   1650 ml  Net  -1490 ml    Weight change:   Constitutional: She is oriented to person, place, and time. She appears well-developed and well-nourished. No distress.  HENT:  Head: Normocephalic and atraumatic.  Mouth/Throat: Oropharynx is clear and moist.  Eyes: Conjunctivae and EOM are normal. Pupils are equal, round, and reactive to light.  Neck: Normal range of motion. Neck supple.  Cardiovascular: Normal rate, regular rhythm, normal heart sounds and intact distal pulses. Exam reveals no gallop and no friction rub.  No murmur heard.  Pulmonary/Chest: No respiratory distress. She has wheezes (diffuse). She exhibits tenderness (diffuse tenderness from coughing).  Abdominal: Soft. Bowel sounds are normal. There is no tenderness. There is no rebound and no guarding.  Musculoskeletal: Normal range of motion. She exhibits no edema and no tenderness.  Neurological: She is alert and oriented to person, place, and time. No cranial nerve deficit.  Skin: Skin is warm and dry. No rash noted.      Lab Results: Results for orders placed during the hospital encounter of 02/28/12 (from the past 24 hour(s))  CBC     Status: Abnormal   Collection Time   02/28/12  4:01 PM      Component Value Range   WBC 7.3  4.0 - 10.5 (K/uL)   RBC 4.16  3.87 - 5.11 (MIL/uL)   Hemoglobin 11.5 (*) 12.0 - 15.0 (g/dL)   HCT 34.5 (*) 36.0 - 46.0 (%)   MCV 82.9  78.0 - 100.0 (fL)   MCH 27.6  26.0 - 34.0  (pg)   MCHC 33.3  30.0 - 36.0 (g/dL)   RDW 14.1  11.5 - 15.5 (%)   Platelets 223  150 - 400 (K/uL)  DIFFERENTIAL     Status: Normal   Collection Time   02/28/12  4:01 PM      Component Value Range   Neutrophils Relative 61  43 - 77 (%)   Neutro Abs 4.5  1.7 - 7.7 (K/uL)   Lymphocytes Relative 31  12 - 46 (%)   Lymphs Abs 2.2  0.7 - 4.0 (K/uL)   Monocytes Relative 5  3 - 12 (%)   Monocytes Absolute 0.4  0.1 - 1.0 (K/uL)   Eosinophils Relative 3  0 - 5 (%)   Eosinophils Absolute 0.2  0.0 - 0.7 (K/uL)   Basophils Relative 0  0 - 1 (%)   Basophils Absolute 0.0  0.0 - 0.1 (K/uL)  BASIC METABOLIC PANEL     Status: Abnormal   Collection Time   02/28/12  4:01 PM      Component Value Range   Sodium 142  135 - 145 (mEq/L)   Potassium 2.9 (*) 3.5 - 5.1 (mEq/L)   Chloride 103  96 - 112 (mEq/L)  CO2 29  19 - 32 (mEq/L)   Glucose, Bld 139 (*) 70 - 99 (mg/dL)   BUN 15  6 - 23 (mg/dL)   Creatinine, Ser 1.30 (*) 0.50 - 1.10 (mg/dL)   Calcium 8.7  8.4 - 10.5 (mg/dL)   GFR calc non Af Amer 40 (*) >90 (mL/min)   GFR calc Af Amer 46 (*) >90 (mL/min)  CARDIAC PANEL(CRET KIN+CKTOT+MB+TROPI)     Status: Abnormal   Collection Time   02/28/12  7:15 PM      Component Value Range   Total CK 438 (*) 7 - 177 (U/L)   CK, MB 6.4 (*) 0.3 - 4.0 (ng/mL)   Troponin I <0.30  <0.30 (ng/mL)   Relative Index 1.5  0.0 - 2.5   PRO B NATRIURETIC PEPTIDE     Status: Abnormal   Collection Time   02/28/12  7:45 PM      Component Value Range   Pro B Natriuretic peptide (BNP) 210.6 (*) 0 - 125 (pg/mL)  MAGNESIUM     Status: Abnormal   Collection Time   02/28/12  7:45 PM      Component Value Range   Magnesium 1.1 (*) 1.5 - 2.5 (mg/dL)  HEMOGLOBIN A1C     Status: Abnormal   Collection Time   02/28/12  7:45 PM      Component Value Range   Hemoglobin A1C 7.0 (*) <5.7 (%)   Mean Plasma Glucose 154 (*) <117 (mg/dL)  TSH     Status: Normal   Collection Time   02/28/12  7:45 PM      Component Value Range   TSH 1.033   0.350 - 4.500 (uIU/mL)  D-DIMER, QUANTITATIVE     Status: Abnormal   Collection Time   02/28/12  7:45 PM      Component Value Range   D-Dimer, Quant 2.45 (*) 0.00 - 0.48 (ug/mL-FEU)  GLUCOSE, CAPILLARY     Status: Abnormal   Collection Time   02/28/12  8:42 PM      Component Value Range   Glucose-Capillary 208 (*) 70 - 99 (mg/dL)   Comment 1 Notify RN    CARDIAC PANEL(CRET KIN+CKTOT+MB+TROPI)     Status: Abnormal   Collection Time   02/29/12  3:28 AM      Component Value Range   Total CK 468 (*) 7 - 177 (U/L)   CK, MB 6.6 (*) 0.3 - 4.0 (ng/mL)   Troponin I <0.30  <0.30 (ng/mL)   Relative Index 1.4  0.0 - 2.5   COMPREHENSIVE METABOLIC PANEL     Status: Abnormal   Collection Time   02/29/12  3:28 AM      Component Value Range   Sodium 139  135 - 145 (mEq/L)   Potassium 3.6  3.5 - 5.1 (mEq/L)   Chloride 100  96 - 112 (mEq/L)   CO2 26  19 - 32 (mEq/L)   Glucose, Bld 249 (*) 70 - 99 (mg/dL)   BUN 17  6 - 23 (mg/dL)   Creatinine, Ser 1.16 (*) 0.50 - 1.10 (mg/dL)   Calcium 8.9  8.4 - 10.5 (mg/dL)   Total Protein 7.4  6.0 - 8.3 (g/dL)   Albumin 3.4 (*) 3.5 - 5.2 (g/dL)   AST 18  0 - 37 (U/L)   ALT 9  0 - 35 (U/L)   Alkaline Phosphatase 82  39 - 117 (U/L)   Total Bilirubin 0.2 (*) 0.3 - 1.2 (mg/dL)   GFR calc non  Af Amer 46 (*) >90 (mL/min)   GFR calc Af Amer 53 (*) >90 (mL/min)  CBC     Status: Abnormal   Collection Time   02/29/12  3:28 AM      Component Value Range   WBC 7.8  4.0 - 10.5 (K/uL)   RBC 4.36  3.87 - 5.11 (MIL/uL)   Hemoglobin 11.9 (*) 12.0 - 15.0 (g/dL)   HCT 35.9 (*) 36.0 - 46.0 (%)   MCV 82.3  78.0 - 100.0 (fL)   MCH 27.3  26.0 - 34.0 (pg)   MCHC 33.1  30.0 - 36.0 (g/dL)   RDW 14.1  11.5 - 15.5 (%)   Platelets 256  150 - 400 (K/uL)  GLUCOSE, CAPILLARY     Status: Abnormal   Collection Time   02/29/12  7:58 AM      Component Value Range   Glucose-Capillary 217 (*) 70 - 99 (mg/dL)     Micro: No results found for this or any previous visit (from the past 240  hour(s)).  Studies/Results: Dg Chest 2 View  02/28/2012  *RADIOLOGY REPORT*  Clinical Data: Cough.  CHEST - 2 VIEW  Comparison: 12/27/2009.  Findings: Cardiomegaly.  Pulmonary vascular congestion.  Basilar atelectasis. No focal consolidation.  No effusion.  Technically suboptimal study due to patient body habitus.  On the lateral view, there is density over the lower thoracic spine favored to represent atelectasis however pneumonia cannot be excluded.  Stable eventration of the right hemidiaphragm.  IMPRESSION: Patchy lower lobe density on the lateral view is favored to represent atelectasis although pneumonia or aspiration could have a similar appearance.  Original Report Authenticated By: Dereck Ligas, M.D.    Medications:  Scheduled Meds:   . sodium chloride   Intravenous STAT  . albuterol  2.5 mg Nebulization Q4H  . albuterol  5 mg Nebulization Once  . albuterol      . albuterol  10 mg/hr Nebulization Once  . amLODipine  10 mg Oral Daily  . aspirin EC  81 mg Oral Daily  . benazepril  40 mg Oral Daily  . enoxaparin  40 mg Subcutaneous Q24H  . furosemide  40 mg Intravenous Once  . guaiFENesin  600 mg Oral BID  . HYDROcodone-acetaminophen  2 tablet Oral Once  . insulin aspart  0-15 Units Subcutaneous TID WC  . insulin aspart protamine-insulin aspart  35 Units Subcutaneous Q breakfast  . ipratropium  0.5 mg Nebulization QID  . metoprolol succinate  100 mg Oral Daily  . moxifloxacin  400 mg Intravenous Once  . moxifloxacin  400 mg Intravenous Q24H  . pantoprazole  80 mg Oral Q1200  . potassium chloride  10 mEq Intravenous Q1 Hr x 3  . potassium chloride  40 mEq Oral Once  . potassium chloride  40 mEq Oral BID  . predniSONE  60 mg Oral Once  . sertraline  50 mg Oral Daily  . simvastatin  20 mg Oral QHS  . DISCONTD: ipratropium  0.5 mg Nebulization Q6H  . DISCONTD: levalbuterol  0.63 mg Nebulization Q6H   Continuous Infusions:  PRN Meds:.acetaminophen, acetaminophen,  guaiFENesin-dextromethorphan, HYDROcodone-acetaminophen, ondansetron (ZOFRAN) IV, ondansetron (ZOFRAN) IV, ondansetron, traMADol   Assessment: Principal Problem:  *PNA (pneumonia)   Hypokalemia Diabetes Chest pain rule out ACS Hypertension Shortness of breath multifactorial possible COPD diastolic heart failure Elevated d-dimer  Plan: The patient will be scheduled for a CT angiogram Doppler of the bilateral lower extremities to rule out a PE and a DVT  2-D echo is pending at this time most likely has diastolic heart failure Continue NovoLog 70 /30 for  diabetes started on sliding scale insulin  PT consult Likely discharge home tomorrow    LOS: 1 day   [facility] 02/29/2012, 8:22 AM

## 2012-02-29 NOTE — Progress Notes (Signed)
  Echocardiogram 2D Echocardiogram has been performed.  Michelle Buck [facility] 02/29/2012, 9:36 AM

## 2012-03-01 ENCOUNTER — Encounter (HOSPITAL_COMMUNITY): Payer: Self-pay | Admitting: *Deleted

## 2012-03-01 LAB — GLUCOSE, CAPILLARY
Glucose-Capillary: 178 mg/dL — ABNORMAL HIGH (ref 70–99)
Glucose-Capillary: 74 mg/dL (ref 70–99)
Glucose-Capillary: 132 mg/dL — ABNORMAL HIGH (ref 70–99)

## 2012-03-01 LAB — BASIC METABOLIC PANEL
Chloride: 101 mEq/L (ref 96–112)
GFR calc Af Amer: 45 mL/min — ABNORMAL LOW (ref >90)
GFR calc non Af Amer: 39 mL/min — ABNORMAL LOW (ref >90)
Potassium: 3.4 mEq/L — ABNORMAL LOW (ref 3.5–5.1)
Sodium: 138 mEq/L (ref 135–145)

## 2012-03-01 MED ORDER — IPRATROPIUM-ALBUTEROL 18-103 MCG/ACT IN AERO
2.0000 | INHALATION_SPRAY | Freq: Four times a day (QID) | RESPIRATORY_TRACT | Status: DC
  Administered 2012-03-02: 2 via RESPIRATORY_TRACT
  Filled 2012-03-01: qty 14.7

## 2012-03-01 MED ORDER — FUROSEMIDE 10 MG/ML IJ SOLN
60.0000 mg | Freq: Once | INTRAMUSCULAR | Status: AC
  Administered 2012-03-01: 60 mg via INTRAVENOUS
  Filled 2012-03-01: qty 6

## 2012-03-01 NOTE — Progress Notes (Signed)
CARE MANAGE MENT UTILIZATION REVIEW NOTE 03/01/2012     Patient:  Michelle Buck, Michelle Buck   Account Number:  [id number]  Documented by:  Suanne Marker Joedy Eickhoff   Per Ur Regulation Reviewed for med. necessity/level of care/duration of stay

## 2012-03-01 NOTE — Evaluation (Signed)
Physical Therapy Evaluation [name] MRN: [id number] DOB: 1936-10-23 Today's Date: 03/01/2012  Problem List:  Patient Active Problem List  Diagnoses  . PNA (pneumonia)  . Hypokalemia    Past Medical History:  Past Medical History  Diagnosis Date  . Diabetes mellitus   . Hypertension   . Obese   . Hiatal hernia   . Scoliosis   . Arthritis    Past Surgical History:  Past Surgical History  Procedure Date  . Abdominal hysterectomy   . Shoulder surgery   . Total hip arthroplasty     bilateral  . Knee arthroscopy     bilateral    PT Assessment/Plan/Recommendation PT Assessment Clinical Impression Statement: Pt presents with diagnosis of wheezing. O2 sats 96% on RA after ambulation. Recommend home safety eval. Pt will benefit from skilled PT in acute session to maximize independence with basic functional mobility in preparation for d/c home.  PT Recommendation/Assessment: Patient will need skilled PT in the acute care venue PT Problem List: Decreased activity tolerance PT Therapy Diagnosis : Difficulty walking PT Plan PT Frequency: Min 3X/week PT Treatment/Interventions: Gait training;Functional mobility training;Therapeutic activities;Patient/family education PT Recommendation Follow Up Recommendations:  (home safety eval) Equipment Recommended: None recommended by PT PT Goals  Acute Rehab PT Goals PT Goal Formulation: With patient Time For Goal Achievement: 7 days Pt will go Supine/Side to Sit: with modified independence PT Goal: Supine/Side to Sit - Progress: Goal set today Pt will go Sit to Supine/Side: with modified independence PT Goal: Sit to Supine/Side - Progress: Goal set today Pt will go Sit to Stand: with modified independence PT Goal: Sit to Stand - Progress: Goal set today Pt will Ambulate: 51 - 150 feet;with modified independence;with rolling walker PT Goal: Ambulate - Progress: Goal set today  PT  Evaluation Precautions/Restrictions    Prior Functioning  Home Living Lives With: Alone Receives Help From: Personal care attendant (M-F  10-1:30) Type of Home: Apartment Home Layout: One level Home Access: Ramped entrance Home Adaptive Equipment: Walker - four wheeled Prior Function Level of Independence: Independent with basic ADLs;Needs assistance with homemaking;Independent with transfers;Requires assistive device for independence Cognition Cognition Arousal/Alertness: Awake/alert Overall Cognitive Status: Appears within functional limits for tasks assessed Sensation/Coordination Sensation Light Touch: Appears Intact Coordination Gross Motor Movements are Fluid and Coordinated: Yes Extremity Assessment RLE Assessment RLE Assessment: Not tested LLE Assessment LLE Assessment: Not tested Mobility (including Balance) Bed Mobility Bed Mobility: No Transfers Transfers: Yes Sit to Stand: 6: Modified independent (Device/Increase time) Stand to Sit: 6: Modified independent (Device/Increase time) Ambulation/Gait Ambulation/Gait: Yes Ambulation/Gait Assistance: 5: Supervision Ambulation/Gait Assistance Details (indicate cue type and reason): Slow gait speed. Noted wheezing during ambulation.  Ambulation Distance (Feet): 75 Feet Assistive device: Rolling walker Gait Pattern: Step-through pattern    Exercise    End of Session PT - End of Session Equipment Utilized During Treatment: Gait belt Activity Tolerance: Patient tolerated treatment well (some wheezing noted) Patient left: in bed;with call bell in reach General Behavior During Session: [facility] for tasks performed Cognition: [facility] for tasks performed  [name] [facility] 03/01/2012, 12:07 PM [id number]

## 2012-03-01 NOTE — Progress Notes (Signed)
Subjective: Feeling better, denies shortness of breath  Objective: Vital signs in last 24 hours: Filed Vitals:   02/29/12 2138 02/29/12 2205 03/01/12 0500 03/01/12 0810  BP: 160/83  173/77   Pulse: 52 56 54   Temp: 97.8 F (36.6 C)  97.5 F (36.4 C)   TempSrc: Oral  Oral   Resp: 18 20 18    Height:      Weight:      SpO2: 97% 90% 99% 97%    Intake/Output Summary (Last 24 hours) at 03/01/12 1139 Last data filed at 03/01/12 0900  Gross per 24 hour  Intake    850 ml  Output   1100 ml  Net   -250 ml    Weight change:   Constitutional: She is oriented to person, place, and time. She appears well-developed and well-nourished. No distress.  HENT:  Head: Normocephalic and atraumatic.  Mouth/Throat: Oropharynx is clear and moist.  Eyes: Conjunctivae and EOM are normal. Pupils are equal, round, and reactive to light.  Neck: Normal range of motion. Neck supple.  Cardiovascular: Normal rate, regular rhythm, normal heart sounds and intact distal pulses. Exam reveals no gallop and no friction rub.  No murmur heard.  Pulmonary/Chest: No respiratory distress. She has wheezes (diffuse). She exhibits tenderness (diffuse tenderness from coughing).  Abdominal: Soft. Bowel sounds are normal. There is no tenderness. There is no rebound and no guarding.  Musculoskeletal: Normal range of motion. She exhibits no edema and no tenderness.  Neurological: She is alert and oriented to person, place, and time. No cranial nerve deficit.  Skin: Skin is warm and dry. No rash noted.       Lab Results: Results for orders placed during the hospital encounter of 02/28/12 (from the past 24 hour(s))  CARDIAC PANEL(CRET KIN+CKTOT+MB+TROPI)     Status: Abnormal   Collection Time   02/29/12 11:42 AM      Component Value Range   Total CK 506 (*) 7 - 177 (U/L)   CK, MB 7.2 (*) 0.3 - 4.0 (ng/mL)   Troponin I <0.30  <0.30 (ng/mL)   Relative Index 1.4  0.0 - 2.5   GLUCOSE, CAPILLARY     Status: Abnormal   Collection Time   02/29/12 12:13 PM      Component Value Range   Glucose-Capillary 124 (*) 70 - 99 (mg/dL)  GLUCOSE, CAPILLARY     Status: Abnormal   Collection Time   02/29/12  4:26 PM      Component Value Range   Glucose-Capillary 123 (*) 70 - 99 (mg/dL)  GLUCOSE, CAPILLARY     Status: Abnormal   Collection Time   02/29/12  9:34 PM      Component Value Range   Glucose-Capillary 160 (*) 70 - 99 (mg/dL)  GLUCOSE, CAPILLARY     Status: Abnormal   Collection Time   03/01/12  7:49 AM      Component Value Range   Glucose-Capillary 132 (*) 70 - 99 (mg/dL)     Micro: No results found for this or any previous visit (from the past 240 hour(s)).  Studies/Results: Dg Chest 2 View  02/28/2012  *RADIOLOGY REPORT*  Clinical Data: Cough.  CHEST - 2 VIEW  Comparison: 12/27/2009.  Findings: Cardiomegaly.  Pulmonary vascular congestion.  Basilar atelectasis. No focal consolidation.  No effusion.  Technically suboptimal study due to patient body habitus.  On the lateral view, there is density over the lower thoracic spine favored to represent atelectasis however pneumonia cannot be excluded.  Stable eventration of the right hemidiaphragm.  IMPRESSION: Patchy lower lobe density on the lateral view is favored to represent atelectasis although pneumonia or aspiration could have a similar appearance.  Original Report Authenticated By: Dereck Ligas, M.D.   Ct Angio Chest W/cm &/or Wo Cm  02/29/2012  *RADIOLOGY REPORT*  Clinical Data: Substernal chest pain for 2 weeks.  Wheezing. Shortness of breath.  Cough.  CT ANGIOGRAPHY CHEST  Technique:  Multidetector CT imaging of the chest using the standard protocol during bolus administration of intravenous contrast. Multiplanar reconstructed images including MIPs were obtained and reviewed to evaluate the vascular anatomy.  Contrast: 171mL OMNIPAQUE IOHEXOL 300 MG/ML IJ SOLN  Comparison: 02/28/2012 chest x-ray.  04/18/2008 chest CT.  Findings: No pulmonary embolus.  Heart  size top normal to minimally enlarged.  Coronary artery calcifications.  Trace pericardial effusion superiorly.  Atherosclerotic type changes of the thoracic aorta with mild ectasia.  Ascending thoracic aorta measures up to 3.5 cm.  Moderate to large size hiatal hernia.  3 mm nodule medial aspect left upper lobe unchanged.  Right lower lobe band-like opacity may represent atelectasis without obstructing lesion noted.  Top normal sized right hilar lymph nodes.  No mediastinal adenopathy.  Degenerative changes and scoliosis of the thoracic spine without bony destructive lesion.  Fatty liver suspected.  Minimal lobularity without other findings of cirrhosis.  IMPRESSION: No pulmonary embolus.  Moderate to large size hiatal hernia.  Right lower lobe band-like opacity may represent atelectasis without obstructing lesion noted.  Degenerative changes and scoliosis of the thoracic spine without bony destructive lesion.  Please see above.  Original Report Authenticated By: Doug Sou, M.D.    Medications: Scheduled Meds:   . amLODipine  10 mg Oral Daily  . aspirin EC  81 mg Oral Daily  . benazepril  40 mg Oral Daily  . enoxaparin  40 mg Subcutaneous Q24H  . furosemide  60 mg Intravenous Once  . guaiFENesin  600 mg Oral BID  . HYDROcodone-acetaminophen  2 tablet Oral Once  . insulin aspart  0-15 Units Subcutaneous TID WC  . insulin aspart protamine-insulin aspart  35 Units Subcutaneous Q breakfast  . ipratropium  0.5 mg Nebulization QID  . metoprolol succinate  100 mg Oral Daily  . moxifloxacin  400 mg Intravenous Q24H  . pantoprazole  80 mg Oral Q1200  . sertraline  50 mg Oral Daily  . simvastatin  20 mg Oral QHS  . vitamin A & D       Continuous Infusions:  PRN Meds:.acetaminophen, acetaminophen, guaiFENesin-dextromethorphan, HYDROcodone-acetaminophen, ondansetron (ZOFRAN) IV, ondansetron, traMADol   Assessment: Principal Problem:  *PNA (pneumonia) Active Problems:  Hypokalemia Diabetes  hemoglobin A1c of 7.0 Chest pain rule out ACS  Hypertension  Shortness of breath multifactorial possible COPD diastolic heart failure  Elevated d-dimer but workup for DVT PE negative Acute on chronic diastolic heart failure   2-D echoLeft ventricle: There was moderate concentric hypertrophy. Systolic function was normal. The estimated ejection fraction was in the range of 55% to 60%. Features are consistent with a pseudonormal left ventricular filling pattern, with concomitant abnormal relaxation and increased filling pressure (grade 2 diastolic dysfunction). Doppler parameters are consistent with high ventricular filling pressure.   Plan: Patient to continue with IV Lasix today Continue with Avelox for pneumonia Discontinued steroids because of improvement in her wheezing and steroid-related hyperglycemia  Anticipate discharge tomorrow, will discharge home on Lasix   LOS: 2 days   [facility] 03/01/2012, 11:39 AM

## 2012-03-02 DIAGNOSIS — I5033 Acute on chronic diastolic (congestive) heart failure: Secondary | ICD-10-CM | POA: Diagnosis present

## 2012-03-02 DIAGNOSIS — E669 Obesity, unspecified: Secondary | ICD-10-CM | POA: Diagnosis present

## 2012-03-02 DIAGNOSIS — E1169 Type 2 diabetes mellitus with other specified complication: Secondary | ICD-10-CM | POA: Diagnosis present

## 2012-03-02 LAB — GLUCOSE, CAPILLARY: Glucose-Capillary: 128 mg/dL — ABNORMAL HIGH (ref 70–99)

## 2012-03-02 MED ORDER — POTASSIUM CHLORIDE CRYS ER 20 MEQ PO TBCR
40.0000 meq | EXTENDED_RELEASE_TABLET | ORAL | Status: AC
  Administered 2012-03-02 (×2): 40 meq via ORAL
  Filled 2012-03-02 (×2): qty 2

## 2012-03-02 MED ORDER — MOXIFLOXACIN HCL 400 MG PO TABS: 400.0000 mg | ORAL_TABLET | Freq: Every day | ORAL | Status: AC

## 2012-03-02 MED ORDER — FUROSEMIDE 20 MG PO TABS: 40.0000 mg | ORAL_TABLET | Freq: Every day | ORAL | Status: DC

## 2012-03-02 MED ORDER — MOXIFLOXACIN HCL 400 MG PO TABS
400.0000 mg | ORAL_TABLET | Freq: Every day | ORAL | Status: DC
  Filled 2012-03-02: qty 1

## 2012-03-02 MED ORDER — IPRATROPIUM-ALBUTEROL 18-103 MCG/ACT IN AERO: 2.0000 | INHALATION_SPRAY | Freq: Four times a day (QID) | RESPIRATORY_TRACT | Status: DC

## 2012-03-02 MED ORDER — METFORMIN HCL 1000 MG PO TABS: 1000.0000 mg | ORAL_TABLET | Freq: Two times a day (BID) | ORAL | Status: DC

## 2012-03-02 MED ORDER — POTASSIUM CHLORIDE 10 MEQ PO TBCR: 20.0000 meq | EXTENDED_RELEASE_TABLET | Freq: Two times a day (BID) | ORAL | Status: DC

## 2012-03-02 NOTE — Discharge Summary (Signed)
Michelle Buck MRN: [id number] DOB/AGE: [birth year] 76 y.o.  Admit date: 02/28/2012 Discharge date: 03/02/2012  Primary Care Physician:  No primary provider on file.   Discharge Diagnoses:   Patient Active Problem List  Diagnoses  . PNA (pneumonia)  . Hypokalemia  . Acute on chronic diastolic congestive heart failure  . Arthritis  . Gait abnormality  . Diabetes mellitus type 2 in obese  . Obesity    DISCHARGE MEDICATION: Medication List  As of 03/02/2012 11:40 AM   TAKE these medications         albuterol-ipratropium 18-103 MCG/ACT inhaler   Commonly known as: COMBIVENT   Inhale 2 puffs into the lungs 4 (four) times daily.      amLODipine 10 MG tablet   Commonly known as: NORVASC   Take 10 mg by mouth daily.      aspirin 81 MG tablet   Take 81 mg by mouth daily.      benazepril 40 MG tablet   Commonly known as: LOTENSIN   Take 40 mg by mouth daily.      esomeprazole 40 MG capsule   Commonly known as: NEXIUM   Take 40 mg by mouth daily before breakfast.      furosemide 20 MG tablet   Commonly known as: LASIX   Take 2 tablets (40 mg total) by mouth daily.      HYDROcodone-acetaminophen 10-650 MG per tablet   Commonly known as: LORCET   Take 1 tablet by mouth every 6 (six) hours as needed. For pain      insulin aspart protamine-insulin aspart (70-30) 100 UNIT/ML injection   Commonly known as: NOVOLOG 70/30   Inject 35 Units into the skin daily with breakfast. 25 units at night      metFORMIN 1000 MG tablet   Commonly known as: GLUCOPHAGE   Take 1 tablet (1,000 mg total) by mouth 2 (two) times daily with a meal. Resume after 03/05/2012      metoprolol succinate 100 MG 24 hr tablet   Commonly known as: TOPROL-XL   Take 100 mg by mouth daily.      moxifloxacin 400 MG tablet   Commonly known as: AVELOX   Take 1 tablet (400 mg total) by mouth daily at 6 PM.      peg 3350 powder Solr   Commonly known as: MOVIPREP   Take 1 kit (100 g total) by mouth once.     potassium chloride 10 MEQ CR tablet   Commonly known as: KLOR-CON   Take 2 tablets (20 mEq total) by mouth 2 (two) times daily.      simvastatin 20 MG tablet   Commonly known as: ZOCOR   Take 20 mg by mouth at bedtime.      ZOLOFT 50 MG tablet   Generic drug: sertraline   Take 50 mg by mouth daily.              Consults:     SIGNIFICANT DIAGNOSTIC STUDIES:  Dg Chest 2 View  02/28/2012  *RADIOLOGY REPORT*  Clinical Data: Cough.  CHEST - 2 VIEW  Comparison: 12/27/2009.  Findings: Cardiomegaly.  Pulmonary vascular congestion.  Basilar atelectasis. No focal consolidation.  No effusion.  Technically suboptimal study due to patient body habitus.  On the lateral view, there is density over the lower thoracic spine favored to represent atelectasis however pneumonia cannot be excluded.  Stable eventration of the right hemidiaphragm.  IMPRESSION: Patchy lower lobe density on the lateral view is favored  to represent atelectasis although pneumonia or aspiration could have a similar appearance.  Original Report Authenticated By: Dereck Ligas, M.D.   Dg Lumbar Spine Complete  02/15/2012  *RADIOLOGY REPORT*  Clinical Data: Low back pain radiating down both legs  LUMBAR SPINE - COMPLETE 4+ VIEW  Comparison: Lumbar spine films of 02/13/2010  Findings: Moderate lumbar scoliosis convex to the right is stable. Degenerative disc disease at L1-2, L2-3, L3-4, L4-5, and L5-S1 appears relatively stable with loss of disc space, sclerosis, and vacuum disc phenomena at multiple levels.  No compression deformity is seen.  A right renal calculus is stable.  Bilateral total hip replacements are noted.  IMPRESSION:  1.  Stable lumbar scoliosis convex to the right with diffuse degenerative disc disease throughout the lumbar spine.  No acute abnormality. 2.  No change in the right renal calculus.  Original Report Authenticated By: Joretta Bachelor, M.D.   Ct Angio Chest W/cm &/or Wo Cm  02/29/2012  *RADIOLOGY REPORT*   Clinical Data: Substernal chest pain for 2 weeks.  Wheezing. Shortness of breath.  Cough.  CT ANGIOGRAPHY CHEST  Technique:  Multidetector CT imaging of the chest using the standard protocol during bolus administration of intravenous contrast. Multiplanar reconstructed images including MIPs were obtained and reviewed to evaluate the vascular anatomy.  Contrast: 167mL OMNIPAQUE IOHEXOL 300 MG/ML IJ SOLN  Comparison: 02/28/2012 chest x-ray.  04/18/2008 chest CT.  Findings: No pulmonary embolus.  Heart size top normal to minimally enlarged.  Coronary artery calcifications.  Trace pericardial effusion superiorly.  Atherosclerotic type changes of the thoracic aorta with mild ectasia.  Ascending thoracic aorta measures up to 3.5 cm.  Moderate to large size hiatal hernia.  3 mm nodule medial aspect left upper lobe unchanged.  Right lower lobe band-like opacity may represent atelectasis without obstructing lesion noted.  Top normal sized right hilar lymph nodes.  No mediastinal adenopathy.  Degenerative changes and scoliosis of the thoracic spine without bony destructive lesion.  Fatty liver suspected.  Minimal lobularity without other findings of cirrhosis.  IMPRESSION: No pulmonary embolus.  Moderate to large size hiatal hernia.  Right lower lobe band-like opacity may represent atelectasis without obstructing lesion noted.  Degenerative changes and scoliosis of the thoracic spine without bony destructive lesion.  Please see above.  Original Report Authenticated By: Doug Sou, M.D.     ECHO:   - Procedure narrative: Transthoracic echocardiography. Image quality was poor. The study was technically difficult, as a result of poor acoustic windows and poor sound wave transmission. - Left ventricle: There was moderate concentric hypertrophy. Systolic function was normal. The estimated ejection fraction was in the range of 55% to 60%. Features are consistent with a pseudonormal left ventricular  filling pattern, with concomitant abnormal relaxation and increased filling pressure (grade 2 diastolic dysfunction). Doppler parameters are consistent with high ventricular filling pressure. - Left atrium: The atrium was mildly dilated.  No results found for this or any previous visit (from the past 240 hour(s)).  BRIEF ADMITTING H & P: 76 y.o. female presenting with cough. The history is provided by the patient. She also has substernal chest pain associated with the cough. The current episode started more than 1 week ago (2 weeks ago). The cough is non-productive. There has been no fever. Associated symptoms include chest pain, shortness of breath and wheezing. Pertinent negatives include no chills, no sweats, no weight loss, no ear congestion, no ear pain, no headaches, no rhinorrhea, no sore throat and no myalgias. She  has tried nothing for the symptoms. The treatment provided no relief. She is not a smoker.     Hospital Course:  Present on Admission:  .Acute on chronic diastolic congestive heart failure: Pt had ECHO which showed Grade II diastolic HF. It was felt that an acute component was contributing to her heart failure. She was treated with IV lasix. She is being discharged on oral lasix as noted above. .Pneumonia: Pt was treated with Avelox and is to complete 2 additional days o complete course of 7 days. .Arthritis: Chronic disease pt to receive home health PT/OT. .Gait abnormality: Pt chronically uses walker. PT/OT at home. .Diabetes mellitus type 2 in obese: Pt had contrast study performed on 03/02/2012. Metformin to be held until 03/05/2012. Pt informed. .Hypokalemia: potassium rep[leted orally. .Obesity: Counseled on weight loss  Disposition and Follow-up:  Pt to follow up with [facility] in 1 week Discharge Orders    Future Orders Please Complete By Expires   Diet - low sodium heart healthy      Diet Carb Modified      Activity as tolerated - No restrictions       Walker          DISCHARGE EXAM:  General:Obese female. Alert, awake, oriented x3, in no acute distress.States that her breathing feels great. Her only bother is the chronic arthritic pain of her hips. Vital Signs:Blood pressure 120/70, pulse 62, temperature 97.6 F (36.4 C), temperature source Oral, resp. rate 19, height 5\' 5"  (1.651 m), weight 102.7 kg (226 lb 6.6 oz), SpO2 95.00%. HEENT: [place]/AT PEERL, EOMI  Neck: Trachea midline, no masses, no thyromegal,y no JVD, no carotid bruit  OROPHARYNX: Moist, No exudate/ erythema/lesions.  Heart: Regular rate and rhythm, without murmurs, rubs, gallops, PMI non-displaced, no heaves or thrills on palpation.  Lungs: Clear to auscultation, no wheezing or rhonchi noted. No increased vocal fremitus resonant to percussion  Abdomen: Soft, nontender, nondistended, positive bowel sounds, no masses no hepatosplenomegaly noted..  Neuro: No focal neurological deficits noted cranial nerves II through XII grossly intact. Musculoskeletal: No warm swelling or erythema around joints.      Basename 03/01/12 1245 02/29/12 0328 02/28/12 1945  NA 138 139 --  K 3.4* 3.6 --  CL 101 100 --  CO2 28 26 --  GLUCOSE 212* 249* --  BUN 22 17 --  CREATININE 1.32* 1.16* --  CALCIUM 9.2 8.9 --  MG -- -- 1.1*  PHOS -- -- --    Basename 02/29/12 0328  AST 18  ALT 9  ALKPHOS 82  BILITOT 0.2*  PROT 7.4  ALBUMIN 3.4*   No results found for this basename: LIPASE:2,AMYLASE:2 in the last 72 hours  Basename 02/29/12 0328 02/28/12 1601  WBC 7.8 7.3  NEUTROABS -- 4.5  HGB 11.9* 11.5*  HCT 35.9* 34.5*  MCV 82.3 82.9  PLT 256 223  Total time for discharge process including face to face time approximately 35 minutes.  Signed: Kamaya Keckler A. 03/02/2012, 11:40 AM

## 2012-03-02 NOTE — Progress Notes (Signed)
Pt was wean off O2, sats has been 92-95% resting, 94-95% while ambulating on hallway.

## 2012-03-02 NOTE — Progress Notes (Signed)
PHARMACIST - PHYSICIAN COMMUNICATION  CONCERNING: Antibiotic IV to Oral Route Change Policy  RECOMMENDATION: This patient is receiving Avelox by the intravenous route.  Based on criteria approved by the Pharmacy and Therapeutics Committee, the antibiotic(s) is/are being converted to the equivalent oral dose form(s).   DESCRIPTION: These criteria include:  Patient being treated for a respiratory tract infection, urinary tract infection, or cellulitis  The patient is not neutropenic and does not exhibit a GI malabsorption state  The patient is eating (either orally or via tube) and/or has been taking other orally administered medications for a least 24 hours  The patient is improving clinically and has a Tmax < 100.5  If you have questions about this conversion, please contact the Pharmacy Department   []   [phone] )  [facility] Pharm[facility], [place] Pager [phone] 03/02/2012 10:16 AM

## 2012-03-02 NOTE — Progress Notes (Signed)
03-02-12 Spoke with Michelle Buck regarding dc planning and needs. [place] [facility] services- PT/OT/RN. Has an appt with [facility] on 03-10-12 that this CM encouraged her to keep since her new PCP appt with Dr Bubba Camp with [facility] is not until 03-22-12. Spoke with patient's sister to discuss all of this as well. Michelle Buck has equipment at home and is agreeable to [employer]. Spoke with Michelle Buck with [facility] to make aware of referral. No more needs assessed.  [place], [place]  [phone]

## 2012-03-04 DIAGNOSIS — M5137 Other intervertebral disc degeneration, lumbosacral region: Secondary | ICD-10-CM | POA: Diagnosis not present

## 2012-03-04 DIAGNOSIS — I10 Essential (primary) hypertension: Secondary | ICD-10-CM | POA: Diagnosis not present

## 2012-03-04 DIAGNOSIS — M51379 Other intervertebral disc degeneration, lumbosacral region without mention of lumbar back pain or lower extremity pain: Secondary | ICD-10-CM | POA: Diagnosis not present

## 2012-03-04 DIAGNOSIS — E119 Type 2 diabetes mellitus without complications: Secondary | ICD-10-CM | POA: Diagnosis not present

## 2012-03-04 DIAGNOSIS — I509 Heart failure, unspecified: Secondary | ICD-10-CM | POA: Diagnosis not present

## 2012-03-04 DIAGNOSIS — M6281 Muscle weakness (generalized): Secondary | ICD-10-CM | POA: Diagnosis not present

## 2012-03-04 DIAGNOSIS — J189 Pneumonia, unspecified organism: Secondary | ICD-10-CM | POA: Diagnosis not present

## 2012-03-04 DIAGNOSIS — K219 Gastro-esophageal reflux disease without esophagitis: Secondary | ICD-10-CM | POA: Diagnosis not present

## 2012-03-04 DIAGNOSIS — E785 Hyperlipidemia, unspecified: Secondary | ICD-10-CM | POA: Diagnosis not present

## 2012-03-04 DIAGNOSIS — Z794 Long term (current) use of insulin: Secondary | ICD-10-CM | POA: Diagnosis not present

## 2012-03-07 DIAGNOSIS — I10 Essential (primary) hypertension: Secondary | ICD-10-CM | POA: Diagnosis not present

## 2012-03-07 DIAGNOSIS — M5137 Other intervertebral disc degeneration, lumbosacral region: Secondary | ICD-10-CM | POA: Diagnosis not present

## 2012-03-07 DIAGNOSIS — I509 Heart failure, unspecified: Secondary | ICD-10-CM | POA: Diagnosis not present

## 2012-03-07 DIAGNOSIS — K219 Gastro-esophageal reflux disease without esophagitis: Secondary | ICD-10-CM | POA: Diagnosis not present

## 2012-03-07 DIAGNOSIS — E119 Type 2 diabetes mellitus without complications: Secondary | ICD-10-CM | POA: Diagnosis not present

## 2012-03-07 DIAGNOSIS — J189 Pneumonia, unspecified organism: Secondary | ICD-10-CM | POA: Diagnosis not present

## 2012-03-09 DIAGNOSIS — E119 Type 2 diabetes mellitus without complications: Secondary | ICD-10-CM | POA: Diagnosis not present

## 2012-03-09 DIAGNOSIS — I10 Essential (primary) hypertension: Secondary | ICD-10-CM | POA: Diagnosis not present

## 2012-03-10 DIAGNOSIS — I10 Essential (primary) hypertension: Secondary | ICD-10-CM | POA: Diagnosis not present

## 2012-03-10 DIAGNOSIS — E119 Type 2 diabetes mellitus without complications: Secondary | ICD-10-CM | POA: Diagnosis not present

## 2012-03-10 DIAGNOSIS — J189 Pneumonia, unspecified organism: Secondary | ICD-10-CM | POA: Diagnosis not present

## 2012-03-10 DIAGNOSIS — I509 Heart failure, unspecified: Secondary | ICD-10-CM | POA: Diagnosis not present

## 2012-03-10 DIAGNOSIS — M5137 Other intervertebral disc degeneration, lumbosacral region: Secondary | ICD-10-CM | POA: Diagnosis not present

## 2012-03-10 DIAGNOSIS — K219 Gastro-esophageal reflux disease without esophagitis: Secondary | ICD-10-CM | POA: Diagnosis not present

## 2012-03-14 DIAGNOSIS — E119 Type 2 diabetes mellitus without complications: Secondary | ICD-10-CM | POA: Diagnosis not present

## 2012-03-14 DIAGNOSIS — M5137 Other intervertebral disc degeneration, lumbosacral region: Secondary | ICD-10-CM | POA: Diagnosis not present

## 2012-03-14 DIAGNOSIS — I509 Heart failure, unspecified: Secondary | ICD-10-CM | POA: Diagnosis not present

## 2012-03-14 DIAGNOSIS — K219 Gastro-esophageal reflux disease without esophagitis: Secondary | ICD-10-CM | POA: Diagnosis not present

## 2012-03-14 DIAGNOSIS — I10 Essential (primary) hypertension: Secondary | ICD-10-CM | POA: Diagnosis not present

## 2012-03-14 DIAGNOSIS — J189 Pneumonia, unspecified organism: Secondary | ICD-10-CM | POA: Diagnosis not present

## 2012-03-16 DIAGNOSIS — I509 Heart failure, unspecified: Secondary | ICD-10-CM | POA: Diagnosis not present

## 2012-03-16 DIAGNOSIS — I10 Essential (primary) hypertension: Secondary | ICD-10-CM | POA: Diagnosis not present

## 2012-03-16 DIAGNOSIS — M5137 Other intervertebral disc degeneration, lumbosacral region: Secondary | ICD-10-CM | POA: Diagnosis not present

## 2012-03-16 DIAGNOSIS — K219 Gastro-esophageal reflux disease without esophagitis: Secondary | ICD-10-CM | POA: Diagnosis not present

## 2012-03-16 DIAGNOSIS — E119 Type 2 diabetes mellitus without complications: Secondary | ICD-10-CM | POA: Diagnosis not present

## 2012-03-16 DIAGNOSIS — J189 Pneumonia, unspecified organism: Secondary | ICD-10-CM | POA: Diagnosis not present

## 2012-03-17 DIAGNOSIS — I10 Essential (primary) hypertension: Secondary | ICD-10-CM | POA: Diagnosis not present

## 2012-03-17 DIAGNOSIS — J189 Pneumonia, unspecified organism: Secondary | ICD-10-CM | POA: Diagnosis not present

## 2012-03-17 DIAGNOSIS — M5137 Other intervertebral disc degeneration, lumbosacral region: Secondary | ICD-10-CM | POA: Diagnosis not present

## 2012-03-17 DIAGNOSIS — K219 Gastro-esophageal reflux disease without esophagitis: Secondary | ICD-10-CM | POA: Diagnosis not present

## 2012-03-17 DIAGNOSIS — E119 Type 2 diabetes mellitus without complications: Secondary | ICD-10-CM | POA: Diagnosis not present

## 2012-03-17 DIAGNOSIS — I509 Heart failure, unspecified: Secondary | ICD-10-CM | POA: Diagnosis not present

## 2012-03-21 DIAGNOSIS — M5137 Other intervertebral disc degeneration, lumbosacral region: Secondary | ICD-10-CM | POA: Diagnosis not present

## 2012-03-21 DIAGNOSIS — J189 Pneumonia, unspecified organism: Secondary | ICD-10-CM | POA: Diagnosis not present

## 2012-03-21 DIAGNOSIS — I10 Essential (primary) hypertension: Secondary | ICD-10-CM | POA: Diagnosis not present

## 2012-03-21 DIAGNOSIS — K219 Gastro-esophageal reflux disease without esophagitis: Secondary | ICD-10-CM | POA: Diagnosis not present

## 2012-03-21 DIAGNOSIS — I509 Heart failure, unspecified: Secondary | ICD-10-CM | POA: Diagnosis not present

## 2012-03-21 DIAGNOSIS — E119 Type 2 diabetes mellitus without complications: Secondary | ICD-10-CM | POA: Diagnosis not present

## 2012-03-22 DIAGNOSIS — E119 Type 2 diabetes mellitus without complications: Secondary | ICD-10-CM | POA: Diagnosis not present

## 2012-03-22 DIAGNOSIS — M5137 Other intervertebral disc degeneration, lumbosacral region: Secondary | ICD-10-CM | POA: Diagnosis not present

## 2012-03-22 DIAGNOSIS — I509 Heart failure, unspecified: Secondary | ICD-10-CM | POA: Diagnosis not present

## 2012-03-22 DIAGNOSIS — J189 Pneumonia, unspecified organism: Secondary | ICD-10-CM | POA: Diagnosis not present

## 2012-03-22 DIAGNOSIS — K219 Gastro-esophageal reflux disease without esophagitis: Secondary | ICD-10-CM | POA: Diagnosis not present

## 2012-03-22 DIAGNOSIS — I10 Essential (primary) hypertension: Secondary | ICD-10-CM | POA: Diagnosis not present

## 2012-03-24 DIAGNOSIS — I10 Essential (primary) hypertension: Secondary | ICD-10-CM | POA: Diagnosis not present

## 2012-03-24 DIAGNOSIS — E119 Type 2 diabetes mellitus without complications: Secondary | ICD-10-CM | POA: Diagnosis not present

## 2012-03-24 DIAGNOSIS — I509 Heart failure, unspecified: Secondary | ICD-10-CM | POA: Diagnosis not present

## 2012-03-24 DIAGNOSIS — M5137 Other intervertebral disc degeneration, lumbosacral region: Secondary | ICD-10-CM | POA: Diagnosis not present

## 2012-03-24 DIAGNOSIS — K219 Gastro-esophageal reflux disease without esophagitis: Secondary | ICD-10-CM | POA: Diagnosis not present

## 2012-03-24 DIAGNOSIS — J189 Pneumonia, unspecified organism: Secondary | ICD-10-CM | POA: Diagnosis not present

## 2012-03-25 DIAGNOSIS — J189 Pneumonia, unspecified organism: Secondary | ICD-10-CM | POA: Diagnosis not present

## 2012-03-25 DIAGNOSIS — I509 Heart failure, unspecified: Secondary | ICD-10-CM | POA: Diagnosis not present

## 2012-03-25 DIAGNOSIS — I10 Essential (primary) hypertension: Secondary | ICD-10-CM | POA: Diagnosis not present

## 2012-03-25 DIAGNOSIS — M5137 Other intervertebral disc degeneration, lumbosacral region: Secondary | ICD-10-CM | POA: Diagnosis not present

## 2012-03-25 DIAGNOSIS — E119 Type 2 diabetes mellitus without complications: Secondary | ICD-10-CM | POA: Diagnosis not present

## 2012-03-25 DIAGNOSIS — K219 Gastro-esophageal reflux disease without esophagitis: Secondary | ICD-10-CM | POA: Diagnosis not present

## 2012-03-28 DIAGNOSIS — K219 Gastro-esophageal reflux disease without esophagitis: Secondary | ICD-10-CM | POA: Diagnosis not present

## 2012-03-28 DIAGNOSIS — E119 Type 2 diabetes mellitus without complications: Secondary | ICD-10-CM | POA: Diagnosis not present

## 2012-03-28 DIAGNOSIS — I10 Essential (primary) hypertension: Secondary | ICD-10-CM | POA: Diagnosis not present

## 2012-03-28 DIAGNOSIS — M5137 Other intervertebral disc degeneration, lumbosacral region: Secondary | ICD-10-CM | POA: Diagnosis not present

## 2012-03-28 DIAGNOSIS — J189 Pneumonia, unspecified organism: Secondary | ICD-10-CM | POA: Diagnosis not present

## 2012-03-28 DIAGNOSIS — I509 Heart failure, unspecified: Secondary | ICD-10-CM | POA: Diagnosis not present

## 2012-03-29 DIAGNOSIS — I509 Heart failure, unspecified: Secondary | ICD-10-CM | POA: Diagnosis not present

## 2012-03-29 DIAGNOSIS — M5137 Other intervertebral disc degeneration, lumbosacral region: Secondary | ICD-10-CM | POA: Diagnosis not present

## 2012-03-29 DIAGNOSIS — K219 Gastro-esophageal reflux disease without esophagitis: Secondary | ICD-10-CM | POA: Diagnosis not present

## 2012-03-29 DIAGNOSIS — E119 Type 2 diabetes mellitus without complications: Secondary | ICD-10-CM | POA: Diagnosis not present

## 2012-03-29 DIAGNOSIS — J189 Pneumonia, unspecified organism: Secondary | ICD-10-CM | POA: Diagnosis not present

## 2012-03-29 DIAGNOSIS — I10 Essential (primary) hypertension: Secondary | ICD-10-CM | POA: Diagnosis not present

## 2012-03-30 DIAGNOSIS — E119 Type 2 diabetes mellitus without complications: Secondary | ICD-10-CM | POA: Diagnosis not present

## 2012-03-30 DIAGNOSIS — G4700 Insomnia, unspecified: Secondary | ICD-10-CM | POA: Diagnosis not present

## 2012-03-30 DIAGNOSIS — E785 Hyperlipidemia, unspecified: Secondary | ICD-10-CM | POA: Diagnosis not present

## 2012-03-30 DIAGNOSIS — I509 Heart failure, unspecified: Secondary | ICD-10-CM | POA: Diagnosis not present

## 2012-03-30 DIAGNOSIS — K2100 Gastro-esophageal reflux disease with esophagitis, without bleeding: Secondary | ICD-10-CM | POA: Diagnosis not present

## 2012-03-30 DIAGNOSIS — F411 Generalized anxiety disorder: Secondary | ICD-10-CM | POA: Diagnosis not present

## 2012-03-31 DIAGNOSIS — J189 Pneumonia, unspecified organism: Secondary | ICD-10-CM | POA: Diagnosis not present

## 2012-03-31 DIAGNOSIS — I509 Heart failure, unspecified: Secondary | ICD-10-CM | POA: Diagnosis not present

## 2012-03-31 DIAGNOSIS — M5137 Other intervertebral disc degeneration, lumbosacral region: Secondary | ICD-10-CM | POA: Diagnosis not present

## 2012-03-31 DIAGNOSIS — I10 Essential (primary) hypertension: Secondary | ICD-10-CM | POA: Diagnosis not present

## 2012-03-31 DIAGNOSIS — K219 Gastro-esophageal reflux disease without esophagitis: Secondary | ICD-10-CM | POA: Diagnosis not present

## 2012-03-31 DIAGNOSIS — E119 Type 2 diabetes mellitus without complications: Secondary | ICD-10-CM | POA: Diagnosis not present

## 2012-04-01 ENCOUNTER — Other Ambulatory Visit: Payer: Self-pay | Admitting: Orthopedic Surgery

## 2012-04-01 DIAGNOSIS — M545 Low back pain, unspecified: Secondary | ICD-10-CM

## 2012-04-01 DIAGNOSIS — M5137 Other intervertebral disc degeneration, lumbosacral region: Secondary | ICD-10-CM | POA: Diagnosis not present

## 2012-04-05 DIAGNOSIS — M5137 Other intervertebral disc degeneration, lumbosacral region: Secondary | ICD-10-CM | POA: Diagnosis not present

## 2012-04-05 DIAGNOSIS — J189 Pneumonia, unspecified organism: Secondary | ICD-10-CM | POA: Diagnosis not present

## 2012-04-05 DIAGNOSIS — I509 Heart failure, unspecified: Secondary | ICD-10-CM | POA: Diagnosis not present

## 2012-04-05 DIAGNOSIS — K219 Gastro-esophageal reflux disease without esophagitis: Secondary | ICD-10-CM | POA: Diagnosis not present

## 2012-04-05 DIAGNOSIS — I10 Essential (primary) hypertension: Secondary | ICD-10-CM | POA: Diagnosis not present

## 2012-04-05 DIAGNOSIS — E119 Type 2 diabetes mellitus without complications: Secondary | ICD-10-CM | POA: Diagnosis not present

## 2012-04-07 DIAGNOSIS — E119 Type 2 diabetes mellitus without complications: Secondary | ICD-10-CM | POA: Diagnosis not present

## 2012-04-07 DIAGNOSIS — M5137 Other intervertebral disc degeneration, lumbosacral region: Secondary | ICD-10-CM | POA: Diagnosis not present

## 2012-04-07 DIAGNOSIS — I509 Heart failure, unspecified: Secondary | ICD-10-CM | POA: Diagnosis not present

## 2012-04-07 DIAGNOSIS — J189 Pneumonia, unspecified organism: Secondary | ICD-10-CM | POA: Diagnosis not present

## 2012-04-07 DIAGNOSIS — I10 Essential (primary) hypertension: Secondary | ICD-10-CM | POA: Diagnosis not present

## 2012-04-07 DIAGNOSIS — K219 Gastro-esophageal reflux disease without esophagitis: Secondary | ICD-10-CM | POA: Diagnosis not present

## 2012-04-08 ENCOUNTER — Ambulatory Visit
Admission: RE | Admit: 2012-04-08 | Discharge: 2012-04-08 | Disposition: A | Discharge status: Home / Self Care | Payer: Medicare Other | Source: Ambulatory Visit | Attending: Orthopedic Surgery | Admitting: Orthopedic Surgery

## 2012-04-08 DIAGNOSIS — M47817 Spondylosis without myelopathy or radiculopathy, lumbosacral region: Secondary | ICD-10-CM | POA: Diagnosis not present

## 2012-04-08 DIAGNOSIS — M4120 Other idiopathic scoliosis, site unspecified: Secondary | ICD-10-CM | POA: Diagnosis not present

## 2012-04-08 DIAGNOSIS — M545 Low back pain, unspecified: Secondary | ICD-10-CM

## 2012-04-08 DIAGNOSIS — M5137 Other intervertebral disc degeneration, lumbosacral region: Secondary | ICD-10-CM | POA: Diagnosis not present

## 2012-04-13 DIAGNOSIS — M5137 Other intervertebral disc degeneration, lumbosacral region: Secondary | ICD-10-CM | POA: Diagnosis not present

## 2012-04-13 DIAGNOSIS — K219 Gastro-esophageal reflux disease without esophagitis: Secondary | ICD-10-CM | POA: Diagnosis not present

## 2012-04-13 DIAGNOSIS — J189 Pneumonia, unspecified organism: Secondary | ICD-10-CM | POA: Diagnosis not present

## 2012-04-13 DIAGNOSIS — I10 Essential (primary) hypertension: Secondary | ICD-10-CM | POA: Diagnosis not present

## 2012-04-13 DIAGNOSIS — E119 Type 2 diabetes mellitus without complications: Secondary | ICD-10-CM | POA: Diagnosis not present

## 2012-04-13 DIAGNOSIS — I509 Heart failure, unspecified: Secondary | ICD-10-CM | POA: Diagnosis not present

## 2012-04-22 DIAGNOSIS — M48061 Spinal stenosis, lumbar region without neurogenic claudication: Secondary | ICD-10-CM | POA: Diagnosis not present

## 2012-04-27 DIAGNOSIS — M5137 Other intervertebral disc degeneration, lumbosacral region: Secondary | ICD-10-CM | POA: Diagnosis not present

## 2012-04-27 DIAGNOSIS — J189 Pneumonia, unspecified organism: Secondary | ICD-10-CM | POA: Diagnosis not present

## 2012-04-27 DIAGNOSIS — E119 Type 2 diabetes mellitus without complications: Secondary | ICD-10-CM | POA: Diagnosis not present

## 2012-04-27 DIAGNOSIS — I10 Essential (primary) hypertension: Secondary | ICD-10-CM | POA: Diagnosis not present

## 2012-04-27 DIAGNOSIS — K219 Gastro-esophageal reflux disease without esophagitis: Secondary | ICD-10-CM | POA: Diagnosis not present

## 2012-04-27 DIAGNOSIS — I509 Heart failure, unspecified: Secondary | ICD-10-CM | POA: Diagnosis not present

## 2012-05-04 ENCOUNTER — Other Ambulatory Visit (HOSPITAL_COMMUNITY): Payer: Self-pay | Admitting: Internal Medicine

## 2012-05-04 DIAGNOSIS — M899 Disorder of bone, unspecified: Secondary | ICD-10-CM | POA: Diagnosis not present

## 2012-05-04 DIAGNOSIS — G4700 Insomnia, unspecified: Secondary | ICD-10-CM | POA: Diagnosis not present

## 2012-05-04 DIAGNOSIS — M545 Low back pain, unspecified: Secondary | ICD-10-CM | POA: Diagnosis not present

## 2012-05-04 DIAGNOSIS — Z1231 Encounter for screening mammogram for malignant neoplasm of breast: Secondary | ICD-10-CM

## 2012-05-04 DIAGNOSIS — Z780 Asymptomatic menopausal state: Secondary | ICD-10-CM

## 2012-05-04 DIAGNOSIS — IMO0002 Reserved for concepts with insufficient information to code with codable children: Secondary | ICD-10-CM | POA: Diagnosis not present

## 2012-05-10 ENCOUNTER — Other Ambulatory Visit: Payer: Self-pay | Admitting: Orthopedic Surgery

## 2012-05-10 DIAGNOSIS — M545 Low back pain, unspecified: Secondary | ICD-10-CM

## 2012-05-10 DIAGNOSIS — M48061 Spinal stenosis, lumbar region without neurogenic claudication: Secondary | ICD-10-CM

## 2012-05-12 ENCOUNTER — Ambulatory Visit
Admission: RE | Admit: 2012-05-12 | Discharge: 2012-05-12 | Disposition: A | Discharge status: Home / Self Care | Payer: Medicare Other | Source: Ambulatory Visit | Attending: Orthopedic Surgery | Admitting: Orthopedic Surgery

## 2012-05-12 DIAGNOSIS — M5126 Other intervertebral disc displacement, lumbar region: Secondary | ICD-10-CM | POA: Diagnosis not present

## 2012-05-12 DIAGNOSIS — M545 Low back pain, unspecified: Secondary | ICD-10-CM

## 2012-05-12 DIAGNOSIS — M48061 Spinal stenosis, lumbar region without neurogenic claudication: Secondary | ICD-10-CM

## 2012-05-12 MED ORDER — METHYLPREDNISOLONE ACETATE 40 MG/ML INJ SUSP (RADIOLOG
120.0000 mg | Freq: Once | INTRAMUSCULAR | Status: AC
  Administered 2012-05-12: 120 mg via EPIDURAL

## 2012-05-12 MED ORDER — IOHEXOL 180 MG/ML  SOLN
1.0000 mL | Freq: Once | INTRAMUSCULAR | Status: AC | PRN
  Administered 2012-05-12: 1 mL via EPIDURAL

## 2012-05-12 NOTE — Discharge Instructions (Signed)

## 2012-05-16 DIAGNOSIS — H409 Unspecified glaucoma: Secondary | ICD-10-CM | POA: Diagnosis not present

## 2012-05-16 DIAGNOSIS — H4011X0 Primary open-angle glaucoma, stage unspecified: Secondary | ICD-10-CM | POA: Diagnosis not present

## 2012-05-24 DIAGNOSIS — M48061 Spinal stenosis, lumbar region without neurogenic claudication: Secondary | ICD-10-CM | POA: Diagnosis not present

## 2012-05-31 ENCOUNTER — Ambulatory Visit (HOSPITAL_COMMUNITY)
Admission: RE | Admit: 2012-05-31 | Discharge: 2012-05-31 | Disposition: A | Discharge status: Home / Self Care | Payer: Medicare Other | Source: Ambulatory Visit | Attending: Internal Medicine | Admitting: Internal Medicine

## 2012-05-31 DIAGNOSIS — Z1231 Encounter for screening mammogram for malignant neoplasm of breast: Secondary | ICD-10-CM

## 2012-05-31 DIAGNOSIS — M899 Disorder of bone, unspecified: Secondary | ICD-10-CM | POA: Diagnosis not present

## 2012-05-31 DIAGNOSIS — Z780 Asymptomatic menopausal state: Secondary | ICD-10-CM

## 2012-05-31 LAB — HM MAMMOGRAPHY: HM Mammogram: NEGATIVE

## 2012-05-31 LAB — HM DEXA SCAN

## 2012-06-13 DIAGNOSIS — E119 Type 2 diabetes mellitus without complications: Secondary | ICD-10-CM | POA: Diagnosis not present

## 2012-06-13 DIAGNOSIS — F329 Major depressive disorder, single episode, unspecified: Secondary | ICD-10-CM | POA: Diagnosis not present

## 2012-06-13 DIAGNOSIS — M6281 Muscle weakness (generalized): Secondary | ICD-10-CM | POA: Diagnosis not present

## 2012-06-13 DIAGNOSIS — H542X11 Low vision right eye category 1, low vision left eye category 1: Secondary | ICD-10-CM | POA: Diagnosis not present

## 2012-06-13 DIAGNOSIS — I509 Heart failure, unspecified: Secondary | ICD-10-CM | POA: Diagnosis not present

## 2012-06-13 DIAGNOSIS — M4120 Other idiopathic scoliosis, site unspecified: Secondary | ICD-10-CM | POA: Diagnosis not present

## 2012-06-13 DIAGNOSIS — I10 Essential (primary) hypertension: Secondary | ICD-10-CM | POA: Diagnosis not present

## 2012-06-13 DIAGNOSIS — R262 Difficulty in walking, not elsewhere classified: Secondary | ICD-10-CM | POA: Diagnosis not present

## 2012-06-13 DIAGNOSIS — M159 Polyosteoarthritis, unspecified: Secondary | ICD-10-CM | POA: Diagnosis not present

## 2012-06-14 DIAGNOSIS — I509 Heart failure, unspecified: Secondary | ICD-10-CM | POA: Diagnosis not present

## 2012-06-14 DIAGNOSIS — E119 Type 2 diabetes mellitus without complications: Secondary | ICD-10-CM | POA: Diagnosis not present

## 2012-06-14 DIAGNOSIS — M4120 Other idiopathic scoliosis, site unspecified: Secondary | ICD-10-CM | POA: Diagnosis not present

## 2012-06-14 DIAGNOSIS — M6281 Muscle weakness (generalized): Secondary | ICD-10-CM | POA: Diagnosis not present

## 2012-06-14 DIAGNOSIS — H542X11 Low vision right eye category 1, low vision left eye category 1: Secondary | ICD-10-CM | POA: Diagnosis not present

## 2012-06-14 DIAGNOSIS — R262 Difficulty in walking, not elsewhere classified: Secondary | ICD-10-CM | POA: Diagnosis not present

## 2012-06-15 DIAGNOSIS — H542X11 Low vision right eye category 1, low vision left eye category 1: Secondary | ICD-10-CM | POA: Diagnosis not present

## 2012-06-15 DIAGNOSIS — M6281 Muscle weakness (generalized): Secondary | ICD-10-CM | POA: Diagnosis not present

## 2012-06-15 DIAGNOSIS — I509 Heart failure, unspecified: Secondary | ICD-10-CM | POA: Diagnosis not present

## 2012-06-15 DIAGNOSIS — R262 Difficulty in walking, not elsewhere classified: Secondary | ICD-10-CM | POA: Diagnosis not present

## 2012-06-15 DIAGNOSIS — E119 Type 2 diabetes mellitus without complications: Secondary | ICD-10-CM | POA: Diagnosis not present

## 2012-06-15 DIAGNOSIS — M4120 Other idiopathic scoliosis, site unspecified: Secondary | ICD-10-CM | POA: Diagnosis not present

## 2012-06-16 DIAGNOSIS — M4120 Other idiopathic scoliosis, site unspecified: Secondary | ICD-10-CM | POA: Diagnosis not present

## 2012-06-16 DIAGNOSIS — M6281 Muscle weakness (generalized): Secondary | ICD-10-CM | POA: Diagnosis not present

## 2012-06-16 DIAGNOSIS — H542X11 Low vision right eye category 1, low vision left eye category 1: Secondary | ICD-10-CM | POA: Diagnosis not present

## 2012-06-16 DIAGNOSIS — R262 Difficulty in walking, not elsewhere classified: Secondary | ICD-10-CM | POA: Diagnosis not present

## 2012-06-16 DIAGNOSIS — I509 Heart failure, unspecified: Secondary | ICD-10-CM | POA: Diagnosis not present

## 2012-06-16 DIAGNOSIS — E119 Type 2 diabetes mellitus without complications: Secondary | ICD-10-CM | POA: Diagnosis not present

## 2012-06-20 DIAGNOSIS — I509 Heart failure, unspecified: Secondary | ICD-10-CM | POA: Diagnosis not present

## 2012-06-20 DIAGNOSIS — M6281 Muscle weakness (generalized): Secondary | ICD-10-CM | POA: Diagnosis not present

## 2012-06-20 DIAGNOSIS — E119 Type 2 diabetes mellitus without complications: Secondary | ICD-10-CM | POA: Diagnosis not present

## 2012-06-20 DIAGNOSIS — R262 Difficulty in walking, not elsewhere classified: Secondary | ICD-10-CM | POA: Diagnosis not present

## 2012-06-20 DIAGNOSIS — H542X11 Low vision right eye category 1, low vision left eye category 1: Secondary | ICD-10-CM | POA: Diagnosis not present

## 2012-06-20 DIAGNOSIS — M4120 Other idiopathic scoliosis, site unspecified: Secondary | ICD-10-CM | POA: Diagnosis not present

## 2012-06-21 DIAGNOSIS — M4120 Other idiopathic scoliosis, site unspecified: Secondary | ICD-10-CM | POA: Diagnosis not present

## 2012-06-21 DIAGNOSIS — M6281 Muscle weakness (generalized): Secondary | ICD-10-CM | POA: Diagnosis not present

## 2012-06-21 DIAGNOSIS — I509 Heart failure, unspecified: Secondary | ICD-10-CM | POA: Diagnosis not present

## 2012-06-21 DIAGNOSIS — H542X11 Low vision right eye category 1, low vision left eye category 1: Secondary | ICD-10-CM | POA: Diagnosis not present

## 2012-06-21 DIAGNOSIS — E119 Type 2 diabetes mellitus without complications: Secondary | ICD-10-CM | POA: Diagnosis not present

## 2012-06-21 DIAGNOSIS — R262 Difficulty in walking, not elsewhere classified: Secondary | ICD-10-CM | POA: Diagnosis not present

## 2012-06-23 DIAGNOSIS — I509 Heart failure, unspecified: Secondary | ICD-10-CM | POA: Diagnosis not present

## 2012-06-23 DIAGNOSIS — M6281 Muscle weakness (generalized): Secondary | ICD-10-CM | POA: Diagnosis not present

## 2012-06-23 DIAGNOSIS — M4120 Other idiopathic scoliosis, site unspecified: Secondary | ICD-10-CM | POA: Diagnosis not present

## 2012-06-23 DIAGNOSIS — R262 Difficulty in walking, not elsewhere classified: Secondary | ICD-10-CM | POA: Diagnosis not present

## 2012-06-23 DIAGNOSIS — E119 Type 2 diabetes mellitus without complications: Secondary | ICD-10-CM | POA: Diagnosis not present

## 2012-06-23 DIAGNOSIS — H542X11 Low vision right eye category 1, low vision left eye category 1: Secondary | ICD-10-CM | POA: Diagnosis not present

## 2012-06-28 DIAGNOSIS — H542X11 Low vision right eye category 1, low vision left eye category 1: Secondary | ICD-10-CM | POA: Diagnosis not present

## 2012-06-28 DIAGNOSIS — M6281 Muscle weakness (generalized): Secondary | ICD-10-CM | POA: Diagnosis not present

## 2012-06-28 DIAGNOSIS — E119 Type 2 diabetes mellitus without complications: Secondary | ICD-10-CM | POA: Diagnosis not present

## 2012-06-28 DIAGNOSIS — R262 Difficulty in walking, not elsewhere classified: Secondary | ICD-10-CM | POA: Diagnosis not present

## 2012-06-28 DIAGNOSIS — I509 Heart failure, unspecified: Secondary | ICD-10-CM | POA: Diagnosis not present

## 2012-06-28 DIAGNOSIS — M4120 Other idiopathic scoliosis, site unspecified: Secondary | ICD-10-CM | POA: Diagnosis not present

## 2012-06-30 DIAGNOSIS — H542X11 Low vision right eye category 1, low vision left eye category 1: Secondary | ICD-10-CM | POA: Diagnosis not present

## 2012-06-30 DIAGNOSIS — E119 Type 2 diabetes mellitus without complications: Secondary | ICD-10-CM | POA: Diagnosis not present

## 2012-06-30 DIAGNOSIS — I509 Heart failure, unspecified: Secondary | ICD-10-CM | POA: Diagnosis not present

## 2012-06-30 DIAGNOSIS — M4120 Other idiopathic scoliosis, site unspecified: Secondary | ICD-10-CM | POA: Diagnosis not present

## 2012-06-30 DIAGNOSIS — M6281 Muscle weakness (generalized): Secondary | ICD-10-CM | POA: Diagnosis not present

## 2012-06-30 DIAGNOSIS — R262 Difficulty in walking, not elsewhere classified: Secondary | ICD-10-CM | POA: Diagnosis not present

## 2012-07-05 DIAGNOSIS — R262 Difficulty in walking, not elsewhere classified: Secondary | ICD-10-CM | POA: Diagnosis not present

## 2012-07-05 DIAGNOSIS — I509 Heart failure, unspecified: Secondary | ICD-10-CM | POA: Diagnosis not present

## 2012-07-05 DIAGNOSIS — M4120 Other idiopathic scoliosis, site unspecified: Secondary | ICD-10-CM | POA: Diagnosis not present

## 2012-07-05 DIAGNOSIS — E119 Type 2 diabetes mellitus without complications: Secondary | ICD-10-CM | POA: Diagnosis not present

## 2012-07-05 DIAGNOSIS — M6281 Muscle weakness (generalized): Secondary | ICD-10-CM | POA: Diagnosis not present

## 2012-07-05 DIAGNOSIS — H542X11 Low vision right eye category 1, low vision left eye category 1: Secondary | ICD-10-CM | POA: Diagnosis not present

## 2012-07-06 DIAGNOSIS — M6281 Muscle weakness (generalized): Secondary | ICD-10-CM | POA: Diagnosis not present

## 2012-07-06 DIAGNOSIS — I509 Heart failure, unspecified: Secondary | ICD-10-CM | POA: Diagnosis not present

## 2012-07-06 DIAGNOSIS — H542X11 Low vision right eye category 1, low vision left eye category 1: Secondary | ICD-10-CM | POA: Diagnosis not present

## 2012-07-06 DIAGNOSIS — M4120 Other idiopathic scoliosis, site unspecified: Secondary | ICD-10-CM | POA: Diagnosis not present

## 2012-07-06 DIAGNOSIS — E119 Type 2 diabetes mellitus without complications: Secondary | ICD-10-CM | POA: Diagnosis not present

## 2012-07-06 DIAGNOSIS — R262 Difficulty in walking, not elsewhere classified: Secondary | ICD-10-CM | POA: Diagnosis not present

## 2012-07-07 DIAGNOSIS — M6281 Muscle weakness (generalized): Secondary | ICD-10-CM | POA: Diagnosis not present

## 2012-07-07 DIAGNOSIS — M4120 Other idiopathic scoliosis, site unspecified: Secondary | ICD-10-CM | POA: Diagnosis not present

## 2012-07-07 DIAGNOSIS — R262 Difficulty in walking, not elsewhere classified: Secondary | ICD-10-CM | POA: Diagnosis not present

## 2012-07-07 DIAGNOSIS — H542X11 Low vision right eye category 1, low vision left eye category 1: Secondary | ICD-10-CM | POA: Diagnosis not present

## 2012-07-07 DIAGNOSIS — I509 Heart failure, unspecified: Secondary | ICD-10-CM | POA: Diagnosis not present

## 2012-07-07 DIAGNOSIS — E119 Type 2 diabetes mellitus without complications: Secondary | ICD-10-CM | POA: Diagnosis not present

## 2012-07-12 DIAGNOSIS — I509 Heart failure, unspecified: Secondary | ICD-10-CM | POA: Diagnosis not present

## 2012-07-12 DIAGNOSIS — R262 Difficulty in walking, not elsewhere classified: Secondary | ICD-10-CM | POA: Diagnosis not present

## 2012-07-12 DIAGNOSIS — M6281 Muscle weakness (generalized): Secondary | ICD-10-CM | POA: Diagnosis not present

## 2012-07-12 DIAGNOSIS — E119 Type 2 diabetes mellitus without complications: Secondary | ICD-10-CM | POA: Diagnosis not present

## 2012-07-12 DIAGNOSIS — IMO0002 Reserved for concepts with insufficient information to code with codable children: Secondary | ICD-10-CM | POA: Diagnosis not present

## 2012-07-12 DIAGNOSIS — E1329 Other specified diabetes mellitus with other diabetic kidney complication: Secondary | ICD-10-CM | POA: Diagnosis not present

## 2012-07-12 DIAGNOSIS — M4120 Other idiopathic scoliosis, site unspecified: Secondary | ICD-10-CM | POA: Diagnosis not present

## 2012-07-12 DIAGNOSIS — H542X11 Low vision right eye category 1, low vision left eye category 1: Secondary | ICD-10-CM | POA: Diagnosis not present

## 2012-07-12 DIAGNOSIS — I10 Essential (primary) hypertension: Secondary | ICD-10-CM | POA: Diagnosis not present

## 2012-07-12 DIAGNOSIS — N182 Chronic kidney disease, stage 2 (mild): Secondary | ICD-10-CM | POA: Diagnosis not present

## 2012-07-12 DIAGNOSIS — G890 Central pain syndrome: Secondary | ICD-10-CM | POA: Diagnosis not present

## 2012-07-13 DIAGNOSIS — M48061 Spinal stenosis, lumbar region without neurogenic claudication: Secondary | ICD-10-CM | POA: Diagnosis not present

## 2012-07-19 DIAGNOSIS — IMO0002 Reserved for concepts with insufficient information to code with codable children: Secondary | ICD-10-CM | POA: Diagnosis not present

## 2012-07-19 DIAGNOSIS — N182 Chronic kidney disease, stage 2 (mild): Secondary | ICD-10-CM | POA: Diagnosis not present

## 2012-07-19 DIAGNOSIS — E119 Type 2 diabetes mellitus without complications: Secondary | ICD-10-CM | POA: Diagnosis not present

## 2012-07-19 DIAGNOSIS — E785 Hyperlipidemia, unspecified: Secondary | ICD-10-CM | POA: Diagnosis not present

## 2012-07-19 DIAGNOSIS — E876 Hypokalemia: Secondary | ICD-10-CM | POA: Diagnosis not present

## 2012-07-21 DIAGNOSIS — E1329 Other specified diabetes mellitus with other diabetic kidney complication: Secondary | ICD-10-CM | POA: Diagnosis not present

## 2012-07-21 DIAGNOSIS — G890 Central pain syndrome: Secondary | ICD-10-CM | POA: Diagnosis not present

## 2012-07-21 DIAGNOSIS — IMO0002 Reserved for concepts with insufficient information to code with codable children: Secondary | ICD-10-CM | POA: Diagnosis not present

## 2012-07-21 DIAGNOSIS — N182 Chronic kidney disease, stage 2 (mild): Secondary | ICD-10-CM | POA: Diagnosis not present

## 2012-07-22 ENCOUNTER — Other Ambulatory Visit: Payer: Self-pay | Admitting: Orthopedic Surgery

## 2012-07-22 DIAGNOSIS — M545 Low back pain, unspecified: Secondary | ICD-10-CM

## 2012-07-25 DIAGNOSIS — E785 Hyperlipidemia, unspecified: Secondary | ICD-10-CM | POA: Diagnosis not present

## 2012-07-25 DIAGNOSIS — E1329 Other specified diabetes mellitus with other diabetic kidney complication: Secondary | ICD-10-CM | POA: Diagnosis not present

## 2012-07-25 DIAGNOSIS — IMO0002 Reserved for concepts with insufficient information to code with codable children: Secondary | ICD-10-CM | POA: Diagnosis not present

## 2012-07-26 ENCOUNTER — Ambulatory Visit
Admission: RE | Admit: 2012-07-26 | Discharge: 2012-07-26 | Disposition: A | Discharge status: Home / Self Care | Payer: Medicare Other | Source: Ambulatory Visit | Attending: Orthopedic Surgery | Admitting: Orthopedic Surgery

## 2012-07-26 VITALS — BP 166/64 | HR 47

## 2012-07-26 DIAGNOSIS — M47817 Spondylosis without myelopathy or radiculopathy, lumbosacral region: Secondary | ICD-10-CM | POA: Diagnosis not present

## 2012-07-26 DIAGNOSIS — M545 Low back pain, unspecified: Secondary | ICD-10-CM

## 2012-07-26 MED ORDER — METHYLPREDNISOLONE ACETATE 40 MG/ML INJ SUSP (RADIOLOG
120.0000 mg | Freq: Once | INTRAMUSCULAR | Status: AC
  Administered 2012-07-26: 120 mg via EPIDURAL

## 2012-07-26 MED ORDER — IOHEXOL 180 MG/ML  SOLN
1.0000 mL | Freq: Once | INTRAMUSCULAR | Status: AC | PRN
  Administered 2012-07-26: 1 mL via EPIDURAL

## 2012-08-25 DIAGNOSIS — H409 Unspecified glaucoma: Secondary | ICD-10-CM | POA: Diagnosis not present

## 2012-08-25 DIAGNOSIS — H4011X0 Primary open-angle glaucoma, stage unspecified: Secondary | ICD-10-CM | POA: Diagnosis not present

## 2012-08-29 DIAGNOSIS — E1329 Other specified diabetes mellitus with other diabetic kidney complication: Secondary | ICD-10-CM | POA: Diagnosis not present

## 2012-08-29 DIAGNOSIS — IMO0002 Reserved for concepts with insufficient information to code with codable children: Secondary | ICD-10-CM | POA: Diagnosis not present

## 2012-08-29 DIAGNOSIS — E785 Hyperlipidemia, unspecified: Secondary | ICD-10-CM | POA: Diagnosis not present

## 2012-09-14 DIAGNOSIS — M47812 Spondylosis without myelopathy or radiculopathy, cervical region: Secondary | ICD-10-CM | POA: Diagnosis not present

## 2012-09-20 DIAGNOSIS — G890 Central pain syndrome: Secondary | ICD-10-CM | POA: Diagnosis not present

## 2012-09-20 DIAGNOSIS — G609 Hereditary and idiopathic neuropathy, unspecified: Secondary | ICD-10-CM | POA: Diagnosis not present

## 2012-09-20 DIAGNOSIS — IMO0002 Reserved for concepts with insufficient information to code with codable children: Secondary | ICD-10-CM | POA: Diagnosis not present

## 2012-09-20 DIAGNOSIS — Z23 Encounter for immunization: Secondary | ICD-10-CM | POA: Diagnosis not present

## 2012-10-05 ENCOUNTER — Ambulatory Visit
Admission: RE | Admit: 2012-10-05 | Discharge: 2012-10-05 | Disposition: A | Discharge status: Home / Self Care | Payer: Medicare Other | Source: Ambulatory Visit | Attending: Internal Medicine | Admitting: Internal Medicine

## 2012-10-05 ENCOUNTER — Other Ambulatory Visit: Payer: Self-pay | Admitting: Internal Medicine

## 2012-10-05 DIAGNOSIS — R05 Cough: Secondary | ICD-10-CM

## 2012-10-05 DIAGNOSIS — R059 Cough, unspecified: Secondary | ICD-10-CM

## 2012-10-05 DIAGNOSIS — J449 Chronic obstructive pulmonary disease, unspecified: Secondary | ICD-10-CM | POA: Diagnosis not present

## 2012-10-05 DIAGNOSIS — E1329 Other specified diabetes mellitus with other diabetic kidney complication: Secondary | ICD-10-CM | POA: Diagnosis not present

## 2012-10-05 DIAGNOSIS — IMO0002 Reserved for concepts with insufficient information to code with codable children: Secondary | ICD-10-CM | POA: Diagnosis not present

## 2012-10-05 DIAGNOSIS — J209 Acute bronchitis, unspecified: Secondary | ICD-10-CM | POA: Diagnosis not present

## 2012-10-18 DIAGNOSIS — G609 Hereditary and idiopathic neuropathy, unspecified: Secondary | ICD-10-CM | POA: Diagnosis not present

## 2012-10-18 DIAGNOSIS — E119 Type 2 diabetes mellitus without complications: Secondary | ICD-10-CM | POA: Diagnosis not present

## 2012-10-18 DIAGNOSIS — I10 Essential (primary) hypertension: Secondary | ICD-10-CM | POA: Diagnosis not present

## 2012-10-18 DIAGNOSIS — E1329 Other specified diabetes mellitus with other diabetic kidney complication: Secondary | ICD-10-CM | POA: Diagnosis not present

## 2012-10-18 DIAGNOSIS — IMO0002 Reserved for concepts with insufficient information to code with codable children: Secondary | ICD-10-CM | POA: Diagnosis not present

## 2012-11-15 DIAGNOSIS — M5137 Other intervertebral disc degeneration, lumbosacral region: Secondary | ICD-10-CM | POA: Diagnosis not present

## 2012-12-12 DIAGNOSIS — H409 Unspecified glaucoma: Secondary | ICD-10-CM | POA: Diagnosis not present

## 2012-12-12 DIAGNOSIS — H4011X0 Primary open-angle glaucoma, stage unspecified: Secondary | ICD-10-CM | POA: Diagnosis not present

## 2013-01-17 DIAGNOSIS — M5137 Other intervertebral disc degeneration, lumbosacral region: Secondary | ICD-10-CM | POA: Diagnosis not present

## 2013-01-18 DIAGNOSIS — G890 Central pain syndrome: Secondary | ICD-10-CM | POA: Diagnosis not present

## 2013-01-18 DIAGNOSIS — E1329 Other specified diabetes mellitus with other diabetic kidney complication: Secondary | ICD-10-CM | POA: Diagnosis not present

## 2013-01-18 DIAGNOSIS — G609 Hereditary and idiopathic neuropathy, unspecified: Secondary | ICD-10-CM | POA: Diagnosis not present

## 2013-01-18 DIAGNOSIS — E119 Type 2 diabetes mellitus without complications: Secondary | ICD-10-CM | POA: Diagnosis not present

## 2013-01-18 DIAGNOSIS — IMO0002 Reserved for concepts with insufficient information to code with codable children: Secondary | ICD-10-CM | POA: Diagnosis not present

## 2013-01-18 DIAGNOSIS — I10 Essential (primary) hypertension: Secondary | ICD-10-CM | POA: Diagnosis not present

## 2013-02-24 ENCOUNTER — Other Ambulatory Visit: Payer: Self-pay | Admitting: *Deleted

## 2013-02-24 DIAGNOSIS — E1329 Other specified diabetes mellitus with other diabetic kidney complication: Secondary | ICD-10-CM

## 2013-02-24 DIAGNOSIS — E876 Hypokalemia: Secondary | ICD-10-CM

## 2013-02-24 DIAGNOSIS — G609 Hereditary and idiopathic neuropathy, unspecified: Secondary | ICD-10-CM

## 2013-02-27 ENCOUNTER — Encounter: Payer: Self-pay | Admitting: Pharmacotherapy

## 2013-02-27 ENCOUNTER — Ambulatory Visit (INDEPENDENT_AMBULATORY_CARE_PROVIDER_SITE_OTHER): Payer: Medicare Other | Admitting: Pharmacotherapy

## 2013-02-27 ENCOUNTER — Other Ambulatory Visit: Payer: Self-pay | Admitting: *Deleted

## 2013-02-27 VITALS — BP 124/82 | HR 64 | Temp 97.4°F | Resp 20 | Wt 225.4 lb

## 2013-02-27 DIAGNOSIS — I10 Essential (primary) hypertension: Secondary | ICD-10-CM | POA: Diagnosis not present

## 2013-02-27 DIAGNOSIS — E119 Type 2 diabetes mellitus without complications: Secondary | ICD-10-CM | POA: Diagnosis not present

## 2013-02-27 DIAGNOSIS — E785 Hyperlipidemia, unspecified: Secondary | ICD-10-CM | POA: Diagnosis not present

## 2013-02-27 DIAGNOSIS — F411 Generalized anxiety disorder: Secondary | ICD-10-CM | POA: Diagnosis not present

## 2013-02-27 MED ORDER — ALPRAZOLAM 0.25 MG PO TABS: ORAL_TABLET | ORAL | Status: DC

## 2013-02-27 NOTE — Progress Notes (Signed)
  Subjective:    Michelle Buck is a 77 y.o. female who presents for follow-up of Type 2 diabetes mellitus.  She is currently on Humulin 70/30 30 units every morning and Metformin 1000mg  twice daily.   She is only checking her BG in the morning.   She did bring her meter with her, she did not bring her logbook either.  She reports that her BG are 120-160 range. No hypoglycemia.  She feels "low" at 80 and 90. She is having some gastroparesis and constipation. No exercise. She does complain of pain in her feet. Denies problems with vision.  Eye exam is current, was seen March 2014. Nocturia every hour during the night.  Random BG:  168mg /dl  Review of Systems A comprehensive review of symptoms was negative for findings except for:  Reflux, nocturia, urinary frequency   Objective:    BP 124/82  Pulse 64  Temp(Src) 97.4 F (36.3 C)  Resp 20  Wt 225 lb 6.4 oz (102.241 kg)  BMI 37.51 kg/m2  General:  alert, cooperative and appears stated age  Oropharynx: lips, mucosa, and tongue normal; teeth and gums normal   Eyes:  conjunctivae/corneas clear. PERRL, EOM's intact. Fundi benign.           Lung: clear to auscultation bilaterally  Heart:  regular rate and rhythm, S1, S2 normal, no murmur, click, rub or gallop     Extremities: edema bilaterally  Skin: dry         Lab Review Glucose, Bld (mg/dL)  Date Value  03/01/2012 212*  02/29/2012 249*  02/28/2012 139*     CO2 (mEq/L)  Date Value  03/01/2012 28   02/29/2012 26   02/28/2012 29      BUN (mg/dL)  Date Value  03/01/2012 22   02/29/2012 17   02/28/2012 15      Creatinine, Ser (mg/dL)  Date Value  03/01/2012 1.32*  02/29/2012 1.16*  02/28/2012 1.30*   Other labs available in [facility] system not available at this time.  Assessment:    Diabetes Mellitus type II, under fair control. Random BG was OK.   HTN - BP goal <140/80 Recent LDL at goal <100   Plan:   1.  Continue Humulin 70/30 30 units once daily 2.  Continue Metformin  1000mg  twice daily 3.  Counseled on nutrition goals.  Needs to stop skipping meals and eat 3 small meals per day. 4.  Counseled on benefits of routine exercise.  Ultimate goal is 30-45 minutes 5 x week. 5.  Counseled on real v relative hypoglycemia.  Needs to check BG when she feels "low" 6. Needs to start SMBG twice daily 7.  HTN at goal <140/80.  Continue current RX. 8.  LIpids at goal on current RX.  No myalgias noted. 9.  Anxiety - Alprazolam refill given.

## 2013-03-14 ENCOUNTER — Other Ambulatory Visit: Payer: Self-pay

## 2013-03-15 ENCOUNTER — Ambulatory Visit: Payer: Self-pay | Admitting: Internal Medicine

## 2013-03-15 DIAGNOSIS — Z0289 Encounter for other administrative examinations: Secondary | ICD-10-CM

## 2013-03-16 DIAGNOSIS — N058 Unspecified nephritic syndrome with other morphologic changes: Secondary | ICD-10-CM | POA: Diagnosis not present

## 2013-03-16 DIAGNOSIS — E119 Type 2 diabetes mellitus without complications: Secondary | ICD-10-CM | POA: Diagnosis not present

## 2013-03-16 DIAGNOSIS — I129 Hypertensive chronic kidney disease with stage 1 through stage 4 chronic kidney disease, or unspecified chronic kidney disease: Secondary | ICD-10-CM | POA: Diagnosis not present

## 2013-03-16 DIAGNOSIS — N183 Chronic kidney disease, stage 3 unspecified: Secondary | ICD-10-CM | POA: Diagnosis not present

## 2013-03-23 ENCOUNTER — Encounter: Payer: Self-pay | Admitting: Geriatric Medicine

## 2013-03-24 DIAGNOSIS — M19079 Primary osteoarthritis, unspecified ankle and foot: Secondary | ICD-10-CM | POA: Diagnosis not present

## 2013-03-27 ENCOUNTER — Encounter: Payer: Self-pay | Admitting: Pharmacotherapy

## 2013-03-27 ENCOUNTER — Other Ambulatory Visit: Payer: Self-pay | Admitting: Orthopedic Surgery

## 2013-03-27 ENCOUNTER — Ambulatory Visit (INDEPENDENT_AMBULATORY_CARE_PROVIDER_SITE_OTHER): Payer: Medicare Other | Admitting: Pharmacotherapy

## 2013-03-27 ENCOUNTER — Ambulatory Visit
Admission: RE | Admit: 2013-03-27 | Discharge: 2013-03-27 | Disposition: A | Discharge status: Home / Self Care | Payer: Medicare Other | Source: Ambulatory Visit | Attending: Orthopedic Surgery | Admitting: Orthopedic Surgery

## 2013-03-27 VITALS — BP 124/82 | HR 64 | Temp 98.1°F | Resp 14 | Wt 232.0 lb

## 2013-03-27 DIAGNOSIS — E119 Type 2 diabetes mellitus without complications: Secondary | ICD-10-CM | POA: Diagnosis not present

## 2013-03-27 DIAGNOSIS — E1329 Other specified diabetes mellitus with other diabetic kidney complication: Secondary | ICD-10-CM

## 2013-03-27 DIAGNOSIS — Z4789 Encounter for other orthopedic aftercare: Secondary | ICD-10-CM | POA: Diagnosis not present

## 2013-03-27 DIAGNOSIS — M25552 Pain in left hip: Secondary | ICD-10-CM

## 2013-03-27 DIAGNOSIS — E876 Hypokalemia: Secondary | ICD-10-CM | POA: Diagnosis not present

## 2013-03-27 DIAGNOSIS — I10 Essential (primary) hypertension: Secondary | ICD-10-CM

## 2013-03-27 DIAGNOSIS — G609 Hereditary and idiopathic neuropathy, unspecified: Secondary | ICD-10-CM

## 2013-03-27 DIAGNOSIS — Z96649 Presence of unspecified artificial hip joint: Secondary | ICD-10-CM | POA: Diagnosis not present

## 2013-03-27 MED ORDER — INSULIN ASPART PROT & ASPART (70-30 MIX) 100 UNIT/ML ~~LOC~~ SUSP: SUBCUTANEOUS | Status: DC

## 2013-03-27 NOTE — Progress Notes (Signed)
  Subjective:    Michelle Buck is a 77 y.o. female who presents for follow-up of Type 2 diabetes mellitus.   She did not get her lab work done before this visit. Complains of her feet burning all over. She did not bring her meter, but did bring a logbook. Her BG has been 111-291.  Her morning numbers average around 160, but her evening BG is often high. No hypoglycemia Nocturia about every hour. Denies problems with vision. She does have peripheral edema.  She is "OK" with her eating habits.  Denies skipping meals. No exercise.  She says she was told not to exercise by Dr. Eulas Post.  She is going for a hip xray today.  She is currently taking 30 units in the morning, and 20 units in the evening. Denies any missed insulin doses. Also on metformin for her DM.  Home BP values 130-140/70-80 range.  Review of Systems A comprehensive review of systems was negative except for: Respiratory: positive for wheezing Cardiovascular: positive for lower extremity edema Genitourinary: positive for nocturia Musculoskeletal: positive for arthralgias, stiff joints and pain in her feet    Objective:    BP 124/82  Pulse 64  Temp(Src) 98.1 F (36.7 C) (Oral)  Resp 14  Wt 232 lb (105.235 kg)  BMI 38.61 kg/m2  General:  alert, cooperative, appears older than stated age, no distress and moderately obese  Oropharynx: normal findings: lips normal without lesions   Eyes:  negative findings: lids and lashes normal, conjunctivae and sclerae normal and corneas clear   Ears:  external ears normal        Lung: wheezes bilaterally  Heart:  regular rate and rhythm     Extremities: edema blilateral lower extremities  Skin: warm and dry, no hyperpigmentation, vitiligo, or suspicious lesions         Lab Review Glucose, Bld (mg/dL)  Date Value  03/01/2012 212*  02/29/2012 249*  02/28/2012 139*     CO2 (mEq/L)  Date Value  03/01/2012 28   02/29/2012 26   02/28/2012 29      BUN (mg/dL)  Date Value   03/01/2012 22   02/29/2012 17   02/28/2012 15      Creatinine, Ser (mg/dL)  Date Value  03/01/2012 1.32*  02/29/2012 1.16*  02/28/2012 1.30*     Labs today:  CMP, lipid as she did not get these done prior to OV  Assessment:    Diabetes Mellitus type II, under poor control.   HTN at goal <140/80    Plan:    1.  Rx changes: Increase insulin to Novolin 70/30 40 units every morning and 25 units in the evening 2.  Continue Metformin (SCr is OK) 3.  Counseled on nutrition goals. 4.  Explained benefits of routine exercise.  Discussed chair exercises. 5.  Counseled on complications of uncontrolled DM.  Explained how her burning / pain in feet may be related to elevated BG. 6.  Blood pressure is well controlled on current RX.  Will continue same and monitor.

## 2013-03-28 LAB — COMPREHENSIVE METABOLIC PANEL
ALT: 6 IU/L (ref 0–32)
AST: 14 IU/L (ref 0–40)
Albumin/Globulin Ratio: 1.4 (ref 1.1–2.5)
Alkaline Phosphatase: 85 IU/L (ref 39–117)
GFR calc Af Amer: 32 mL/min/{1.73_m2} — ABNORMAL LOW (ref >59)
GFR calc non Af Amer: 28 mL/min/{1.73_m2} — ABNORMAL LOW (ref >59)
Potassium: 3.4 mmol/L — ABNORMAL LOW (ref 3.5–5.2)
Sodium: 142 mmol/L (ref 134–144)
Total Bilirubin: 0.2 mg/dL (ref 0.0–1.2)

## 2013-03-28 LAB — LIPID PANEL
Chol/HDL Ratio: 2.6 ratio units (ref 0.0–4.4)
HDL: 50 mg/dL (ref >39)
Triglycerides: 156 mg/dL — ABNORMAL HIGH (ref 0–149)

## 2013-04-08 ENCOUNTER — Other Ambulatory Visit: Payer: Self-pay | Admitting: Internal Medicine

## 2013-04-17 DIAGNOSIS — E119 Type 2 diabetes mellitus without complications: Secondary | ICD-10-CM | POA: Diagnosis not present

## 2013-04-17 DIAGNOSIS — Z961 Presence of intraocular lens: Secondary | ICD-10-CM | POA: Diagnosis not present

## 2013-04-17 DIAGNOSIS — H4011X0 Primary open-angle glaucoma, stage unspecified: Secondary | ICD-10-CM | POA: Diagnosis not present

## 2013-04-17 DIAGNOSIS — H409 Unspecified glaucoma: Secondary | ICD-10-CM | POA: Diagnosis not present

## 2013-04-28 ENCOUNTER — Other Ambulatory Visit: Payer: Self-pay | Admitting: Internal Medicine

## 2013-05-08 ENCOUNTER — Encounter: Payer: Self-pay | Admitting: Pharmacotherapy

## 2013-05-08 ENCOUNTER — Ambulatory Visit (INDEPENDENT_AMBULATORY_CARE_PROVIDER_SITE_OTHER): Payer: Medicare Other | Admitting: Pharmacotherapy

## 2013-05-08 VITALS — BP 158/92 | HR 65 | Temp 98.1°F | Resp 14 | Wt 220.6 lb

## 2013-05-08 DIAGNOSIS — E785 Hyperlipidemia, unspecified: Secondary | ICD-10-CM | POA: Diagnosis not present

## 2013-05-08 DIAGNOSIS — IMO0001 Reserved for inherently not codable concepts without codable children: Secondary | ICD-10-CM | POA: Diagnosis not present

## 2013-05-08 DIAGNOSIS — I10 Essential (primary) hypertension: Secondary | ICD-10-CM | POA: Diagnosis not present

## 2013-05-08 NOTE — Progress Notes (Signed)
  Subjective:    Michelle Buck is a 77 y.o. female who presents for follow-up of Type 2 diabetes mellitus.   Last OV increased her Novolin 70/30 to 40 units AM, 25 units PM She doesn't feel good today.  Has been using her inhalers. She did not take her insulin this morning. Her logbook shows 89-157.  No hypoglycemia.  Home BP ranges 116-158/68-72  She has been having a lot of sciatic pain.  Seeing a specialist at [facility] and Dr. Eulas Post. She has pain medication available. To see Dr. Eulas Post 05/22/13  Trying to eat healthy meals.  However, she eats a lot of rice. No exercise. Not as much peripheral edema. Denies problems with feet. Denies problems with vision. Up several times with nocturia. Denies polyuria during the day.   Review of Systems A comprehensive review of systems was negative except for: Cardiovascular: positive for peripheral edema Genitourinary: positive for nocturia Endocrine: positive for diabetic symptoms including nocturia    Objective:    BP 158/92  Pulse 65  Temp(Src) 98.1 F (36.7 C) (Oral)  Resp 14  Wt 220 lb 9.6 oz (100.064 kg)  BMI 36.71 kg/m2  General:  alert, cooperative, appears stated age, no distress and moderately obese  Oropharynx: normal findings: lips normal without lesions   Eyes:  negative findings: lids and lashes normal and conjunctivae and sclerae normal   Ears:  external ears normal        Lung: clear to auscultation bilaterally  Heart:  regular rate and rhythm     Extremities: bilteral lower extremity edema  Skin: warm and dry     Neuro: mental status, speech normal, alert and oriented x3 and ambulates with seated walker   Lab Review Glucose (mg/dL)  Date Value  03/27/2013 217*     Glucose, Bld (mg/dL)  Date Value  03/01/2012 212*  02/29/2012 249*  02/28/2012 139*     CO2 (mmol/L)  Date Value  03/27/2013 24   03/01/2012 28   02/29/2012 26      BUN (mg/dL)  Date Value  03/27/2013 18   03/01/2012 22   02/29/2012 17    02/28/2012 15      Creatinine, Ser (mg/dL)  Date Value  03/27/2013 1.78*  03/01/2012 1.32*  02/29/2012 1.16*     03/27/13: Total cholesterol:  130 Triglycerides:  156 HDL:  50 LDL:  49 AST:  14 ALT:  6   Assessment:    Diabetes Mellitus type II, under good control.  BP goal<130/80 - BP above goal in office LDL goal <100 - at goal.   Plan:    1.  Rx changes: will stop Metformin due to SCr 1.78 2.  Continue Novolin 70/30 40 units morning and 25 units at night. 3.  Counseled on nutrition goals.  Praised positive changes. 4.  Counseled on benefit of routine exercise.  She is limited due to arthritis pain.  Has a f/u visit with Dr. Eulas Post to address pain issues. 5.  BP at goal at home.  She will continue to monitor home BP.  Continue enalapril, clonidine, amlodipine  and metoprolol. 6.  LDL at goal <100.  Continue simvastatin 20mg  daily.

## 2013-05-08 NOTE — Patient Instructions (Signed)
Keep up the good work

## 2013-05-12 ENCOUNTER — Other Ambulatory Visit: Payer: Self-pay | Admitting: *Deleted

## 2013-05-12 MED ORDER — GABAPENTIN 100 MG PO CAPS: ORAL_CAPSULE | ORAL | Status: DC

## 2013-05-16 ENCOUNTER — Encounter: Payer: Self-pay | Admitting: *Deleted

## 2013-05-17 ENCOUNTER — Encounter: Payer: Self-pay | Admitting: Internal Medicine

## 2013-05-17 ENCOUNTER — Ambulatory Visit (INDEPENDENT_AMBULATORY_CARE_PROVIDER_SITE_OTHER): Payer: Medicare Other | Admitting: Internal Medicine

## 2013-05-17 VITALS — BP 142/88 | HR 62 | Temp 98.1°F | Resp 14 | Ht 65.0 in | Wt 221.6 lb

## 2013-05-17 DIAGNOSIS — J441 Chronic obstructive pulmonary disease with (acute) exacerbation: Secondary | ICD-10-CM | POA: Diagnosis not present

## 2013-05-17 MED ORDER — MOXIFLOXACIN HCL 400 MG PO TABS: 400.0000 mg | ORAL_TABLET | Freq: Every day | ORAL | Status: AC

## 2013-05-17 MED ORDER — PREDNISONE 50 MG PO TABS: ORAL_TABLET | ORAL | Status: AC

## 2013-05-17 NOTE — Progress Notes (Signed)
  Subjective:    Patient ID: Michelle Buck, female    DOB: 09/18/1936, 77 y.o.   MRN: [id number]  HPI She started having dry cough a week back which then got productive with yellow phlegm. She also has runny nose with mucus. Her chest feels stuffed up. Denies sinus congestion. ocassional headache No fever or chills appettie has decreased Denies nausea or vomiting Energy level has decreased and she feels weak Drinking fluids Her son had a cold before she started with these symptoms Denies smoking She has been wheezing recently Using her advair bid and ventolin twice a day Does get SOB with her minor activities and even resting for past few days Ears feel stuffed up and uncomfortable  Review of Systems  See hpi    Objective:   Physical Exam  BP 142/88  Pulse 62  Temp(Src) 98.1 F (36.7 C) (Oral)  Resp 14  Ht 5\' 5"  (1.651 m)  Wt 221 lb 9.6 oz (100.517 kg)  BMI 36.88 kg/m2  SpO2 97%  gen- obese female pt in NAD heent- b/l ear canals normal, normal tympanic membrane, no sinus tenderness, tender palpable anterior right cervical LN, mild oropharyngeal erythema cvs- normal s1,s2, rrr respi- b/l decreased air entry with wheezing and rhonchi present abdo- bs+, soft, non tender Ext- using a walker Neuro- aaox 3     Assessment & Plan:    Copd exacerbation- with increased cough, sputum production and her shortness of breath will treat for copd exacerbation likely in setting of acute bronchitis. Will provide prednisone 50 mg po daily for 5 days with avelox and mucinex. To take ventolin 4 times a day. Reassess in a week if no improvement. To monitor blood sugar in setting of DM and use of prednisone

## 2013-05-17 NOTE — Patient Instructions (Signed)
Take your breathing treatment ventolin 4 times a day for 1 week and complete course of prednisone and antibiotics

## 2013-05-18 ENCOUNTER — Ambulatory Visit: Payer: Self-pay | Admitting: Internal Medicine

## 2013-05-22 DIAGNOSIS — T8489XA Other specified complication of internal orthopedic prosthetic devices, implants and grafts, initial encounter: Secondary | ICD-10-CM | POA: Diagnosis not present

## 2013-05-23 ENCOUNTER — Encounter: Payer: Self-pay | Admitting: Internal Medicine

## 2013-05-23 ENCOUNTER — Ambulatory Visit (INDEPENDENT_AMBULATORY_CARE_PROVIDER_SITE_OTHER): Payer: Medicare Other | Admitting: Internal Medicine

## 2013-05-23 VITALS — BP 124/76 | HR 58 | Temp 96.5°F | Resp 22 | Ht 62.0 in | Wt 218.2 lb

## 2013-05-23 DIAGNOSIS — G8929 Other chronic pain: Secondary | ICD-10-CM

## 2013-05-23 DIAGNOSIS — M129 Arthropathy, unspecified: Secondary | ICD-10-CM

## 2013-05-23 DIAGNOSIS — E119 Type 2 diabetes mellitus without complications: Secondary | ICD-10-CM

## 2013-05-23 DIAGNOSIS — M1990 Unspecified osteoarthritis, unspecified site: Secondary | ICD-10-CM

## 2013-05-23 DIAGNOSIS — I10 Essential (primary) hypertension: Secondary | ICD-10-CM

## 2013-05-23 DIAGNOSIS — E1169 Type 2 diabetes mellitus with other specified complication: Secondary | ICD-10-CM

## 2013-05-23 DIAGNOSIS — E669 Obesity, unspecified: Secondary | ICD-10-CM

## 2013-05-23 DIAGNOSIS — J449 Chronic obstructive pulmonary disease, unspecified: Secondary | ICD-10-CM

## 2013-05-23 NOTE — Patient Instructions (Signed)
Please bring all your home medications and your glucometer in your next office visit

## 2013-05-30 NOTE — Progress Notes (Signed)
  Subjective:    Patient ID: Michelle Buck, female    DOB: March 08, 1936, 77 y.o.   MRN: [id number]  Allergies  Allergen Reactions  . Codeine Nausea And Vomiting    Patient states N/V with codeine  . Hydralazine   . Hydrocodone Nausea And Vomiting  . Oxycodone Other (See Comments)    Patient states she can tolerate oxycodone  . Tylenol (Acetaminophen)   . Penicillins Rash    Patient states rash/itch with penicillin   Chief Complaint  Patient presents with  . Medical Managment of Chronic Issues    FOLLOW UP ON BROHNCHITIS     HPI  77 y/o female patient is here for routine follow up. She was seen last visit for copd exacerbation. She has completed her antibiotics and prednsione course and appears better this visit. Still has some residual cough with white phlegm. Has been compliant with her breathing treatment. Energy level is slowly improving. appetite is good. Wheezing has resolved  Anxiety- remains stable. On alprazolam  HTN- bp remains stable. Reviewed her bp medications  gerd- symptoms under control. On nexium and sucralfate  Secondary DM with neuropathy and renal manifestations- continue metformin and aspart, continue gabapentin. On ACEI  Chronic pain- better controlled with current regimen of norco ad gabapentin with etodolac  chf- stable with current regimen. Weight has been stable  Review of Systems  Constitutional: Negative for fever, chills, diaphoresis, appetite change and unexpected weight change.  HENT: Negative for ear pain, congestion and ear discharge.   Eyes: Negative for visual disturbance.  Respiratory: Positive for cough. Negative for shortness of breath and wheezing.   Cardiovascular: Negative for chest pain and palpitations.  Genitourinary: Negative for dysuria.  Musculoskeletal: Positive for back pain and arthralgias.  Skin: Negative for color change, rash and wound.  Neurological: Negative for dizziness and light-headedness.  Hematological: Negative for  adenopathy.  Psychiatric/Behavioral: Negative for behavioral problems, dysphoric mood and agitation. The patient is not nervous/anxious.       Objective:   Physical Exam  BP 124/76  Pulse 58  Temp(Src) 96.5 F (35.8 C)  Resp 22  Ht 5\' 2"  (1.575 m)  Wt 218 lb 3.2 oz (98.975 kg)  BMI 39.9 kg/m2  SpO2 98%  gen- obese female pt in NAD heent- b/l ear canals normal, normal tympanic membrane, no sinus tenderness, no palpable LAD cvs- normal s1,s2, rrr respi- b/l improved air entry, mild expiratory wheezing present, no rhonchi or crackles abdo- bs+, soft, non tender Ext- using a walker, trace edema bilaterally Neuro- aaox 3     Assessment & Plan:   Copd- recent exacerbation improved post steroid and antibiotic course. Continue breathing treatment for now and mucinex prn for cough  chf- continue current regimen, monitor weight, salt restriction  HTN- bp well controlled. Will not make any med changes. Monitor  Chronic pain- continue current pain regimen  DM- continue cbg monitor and medication, diet, exercise counseling provided  GERD- continue PPI and sucralfate for now

## 2013-06-20 ENCOUNTER — Encounter: Payer: Self-pay | Admitting: Internal Medicine

## 2013-06-20 ENCOUNTER — Ambulatory Visit (INDEPENDENT_AMBULATORY_CARE_PROVIDER_SITE_OTHER): Payer: Medicare Other | Admitting: Internal Medicine

## 2013-06-20 VITALS — BP 130/82 | HR 63 | Temp 97.8°F | Resp 14 | Ht 62.0 in | Wt 218.2 lb

## 2013-06-20 DIAGNOSIS — J449 Chronic obstructive pulmonary disease, unspecified: Secondary | ICD-10-CM | POA: Diagnosis not present

## 2013-06-20 DIAGNOSIS — G8929 Other chronic pain: Secondary | ICD-10-CM

## 2013-06-20 DIAGNOSIS — E119 Type 2 diabetes mellitus without complications: Secondary | ICD-10-CM

## 2013-06-20 DIAGNOSIS — M129 Arthropathy, unspecified: Secondary | ICD-10-CM

## 2013-06-20 DIAGNOSIS — E669 Obesity, unspecified: Secondary | ICD-10-CM

## 2013-06-20 DIAGNOSIS — M1990 Unspecified osteoarthritis, unspecified site: Secondary | ICD-10-CM

## 2013-06-20 DIAGNOSIS — I10 Essential (primary) hypertension: Secondary | ICD-10-CM

## 2013-06-20 DIAGNOSIS — E1169 Type 2 diabetes mellitus with other specified complication: Secondary | ICD-10-CM

## 2013-06-20 MED ORDER — INSULIN ASPART PROT & ASPART (70-30 MIX) 100 UNIT/ML ~~LOC~~ SUSP: 35.0000 [IU] | Freq: Every day | SUBCUTANEOUS | Status: DC

## 2013-06-20 NOTE — Progress Notes (Signed)
Patient ID: Michelle Buck, female   DOB: 18-Jan-1936, 77 y.o.   MRN: [id number]  Chief Complaint  Patient presents with  . Medical Managment of Chronic Issues   Allergies  Allergen Reactions  . Codeine Nausea And Vomiting    Patient states N/V with codeine  . Hydralazine   . Hydrocodone Nausea And Vomiting  . Oxycodone Other (See Comments)    Patient states she can tolerate oxycodone  . Tylenol (Acetaminophen)   . Penicillins Rash    Patient states rash/itch with penicillin    HPI  77 y/o female patient is here for routine follow up. Her arthritis has been acting up and affecting her quality of life. She follows with orthopedics. Denies any other complaints  Anxiety- remains stable. On alprazolam  HTN- bp remains stable. Reviewed her bp medications-clonidine, metoprolol, enalapril  gerd- symptoms under control. On nexium and sucralfate  Secondary DM with neuropathy and renal manifestations- on metformin and aspart, continue gabapentin. On ACEI and statin. cbg this am 122  Chronic pain- better controlled with current regimen of norco ad gabapentin with etodolac  chf- stable with current regimen. Weight has been stable  Review of Systems  Constitutional: Negative for fever, chills, diaphoresis, appetite change and unexpected weight change.  HENT: Negative for ear pain, congestion and ear discharge.   Eyes: Negative for visual disturbance.  Respiratory: Positive for cough. Negative for shortness of breath and wheezing.   Cardiovascular: Negative for chest pain and palpitations.  Genitourinary: Negative for dysuria.  Musculoskeletal: Positive for back pain and arthralgias.  Skin: Negative for color change, rash and wound.  Neurological: Negative for dizziness and light-headedness.  Hematological: Negative for adenopathy.  Psychiatric/Behavioral: Negative for behavioral problems, dysphoric mood and agitation. The patient is not nervous/anxious.    Exam BP 130/82  Pulse 63   Temp(Src) 97.8 F (36.6 C) (Oral)  Resp 14  Ht 5\' 2"  (1.575 m)  Wt 218 lb 3.2 oz (98.975 kg)  BMI 39.9 kg/m2  gen- obese female pt in NAD heent- b/l ear canals normal, normal tympanic membrane, no sinus tenderness, tender palpable anterior right cervical LN, mild oropharyngeal erythema cvs- normal s1,s2, rrr respi- b/l decreased air entry with wheezing and rhonchi present abdo- bs+, soft, non tender Ext- using a walker Neuro- aaox 3  Medications reviewed  Assessment/plan  Copd- Continue breathing treatment for now and mucinex prn for cough  chf- continue current regimen, monitor weight, salt restriction  HTN- bp well controlled. Will not make any med changes. Monitor. Continue kcl supplement  Chronic pain- continue current pain regimen  DM- continue cbg monitor and medication, diet, exercise counseling provided. Reduce aspart to 35 u for now  GERD- continue PPI and sucralfate for now

## 2013-07-17 DIAGNOSIS — H409 Unspecified glaucoma: Secondary | ICD-10-CM | POA: Diagnosis not present

## 2013-07-17 DIAGNOSIS — H4011X0 Primary open-angle glaucoma, stage unspecified: Secondary | ICD-10-CM | POA: Diagnosis not present

## 2013-07-20 ENCOUNTER — Other Ambulatory Visit: Payer: Self-pay | Admitting: Internal Medicine

## 2013-07-24 DIAGNOSIS — M19019 Primary osteoarthritis, unspecified shoulder: Secondary | ICD-10-CM | POA: Diagnosis not present

## 2013-08-01 ENCOUNTER — Telehealth: Payer: Self-pay | Admitting: *Deleted

## 2013-08-01 NOTE — Telephone Encounter (Signed)
Patient states that she is hungry all the time and fatigued. Says her Blood sugars goes from anywhere from 100 to 215. Patient has an appointment with [name] on 08/07/2013 and will bring in medications and BS log. Told her to keep her blood sugars monitored and if any new symptoms to call us or go to Urgent care. Patient Agreed.

## 2013-08-03 ENCOUNTER — Other Ambulatory Visit: Payer: Medicare Other

## 2013-08-03 DIAGNOSIS — IMO0001 Reserved for inherently not codable concepts without codable children: Secondary | ICD-10-CM

## 2013-08-04 LAB — COMPREHENSIVE METABOLIC PANEL
ALT: 9 IU/L (ref 0–32)
AST: 16 IU/L (ref 0–40)
Albumin/Globulin Ratio: 1.5 (ref 1.1–2.5)
Albumin: 4.2 g/dL (ref 3.5–4.8)
Alkaline Phosphatase: 101 IU/L (ref 39–117)
BUN/Creatinine Ratio: 14 (ref 11–26)
BUN: 19 mg/dL (ref 8–27)
CO2: 24 mmol/L (ref 18–29)
Calcium: 9.8 mg/dL (ref 8.6–10.2)
Chloride: 97 mmol/L (ref 97–108)
Creatinine, Ser: 1.32 mg/dL — ABNORMAL HIGH (ref 0.57–1.00)
GFR calc Af Amer: 46 mL/min/{1.73_m2} — ABNORMAL LOW (ref >59)
GFR calc non Af Amer: 39 mL/min/{1.73_m2} — ABNORMAL LOW (ref >59)
Globulin, Total: 2.8 g/dL (ref 1.5–4.5)
Glucose: 138 mg/dL — ABNORMAL HIGH (ref 65–99)
Potassium: 3.8 mmol/L (ref 3.5–5.2)
Sodium: 142 mmol/L (ref 134–144)
Total Bilirubin: 0.3 mg/dL (ref 0.0–1.2)
Total Protein: 7 g/dL (ref 6.0–8.5)

## 2013-08-04 LAB — MICROALBUMIN / CREATININE URINE RATIO
Creatinine, Ur: 160.6 mg/dL (ref 15.0–278.0)
MICROALB/CREAT RATIO: 113.3 mg/g creat — ABNORMAL HIGH (ref 0.0–30.0)
Microalbumin, Urine: 181.9 ug/mL — ABNORMAL HIGH (ref 0.0–17.0)

## 2013-08-04 LAB — CHOLESTEROL, TOTAL: Cholesterol, Total: 150 mg/dL (ref 100–199)

## 2013-08-04 LAB — HEMOGLOBIN A1C
Est. average glucose Bld gHb Est-mCnc: 166 mg/dL
Hgb A1c MFr Bld: 7.4 % — ABNORMAL HIGH (ref 4.8–5.6)

## 2013-08-07 ENCOUNTER — Encounter: Payer: Self-pay | Admitting: Pharmacotherapy

## 2013-08-07 ENCOUNTER — Ambulatory Visit (INDEPENDENT_AMBULATORY_CARE_PROVIDER_SITE_OTHER): Payer: Medicare Other | Admitting: Pharmacotherapy

## 2013-08-07 VITALS — BP 136/82 | HR 66 | Temp 98.2°F | Wt 220.0 lb

## 2013-08-07 DIAGNOSIS — E785 Hyperlipidemia, unspecified: Secondary | ICD-10-CM | POA: Diagnosis not present

## 2013-08-07 DIAGNOSIS — IMO0001 Reserved for inherently not codable concepts without codable children: Secondary | ICD-10-CM | POA: Diagnosis not present

## 2013-08-07 DIAGNOSIS — I10 Essential (primary) hypertension: Secondary | ICD-10-CM | POA: Diagnosis not present

## 2013-08-07 MED ORDER — INSULIN NPH ISOPHANE & REGULAR (70-30) 100 UNIT/ML ~~LOC~~ SUSP: 35.0000 [IU] | Freq: Every day | SUBCUTANEOUS | Status: DC

## 2013-08-07 NOTE — Progress Notes (Signed)
  Subjective:    Michelle Buck is a 77 y.o. female who presents for follow-up of Type 2 diabetes mellitus.  A1C 7.4% Home BP good. Average BG:  93-239  Feels low, but not actually low. Still taking metformin. We changed her insulin due to cost.  She wants to change back.  Now on Novo, wants to go back to Humulin.  Complains of polyphagia, nocturia, polyruia, polydipsia. Complains of pain in feet. Denies problems with vision.  Review of Systems A comprehensive review of systems was negative except for: Genitourinary: positive for nocturia Endocrine: positive for diabetic symptoms including polydipsia, polyphagia and polyuria    Objective:    BP 136/82  Pulse 66  Temp(Src) 98.2 F (36.8 C) (Oral)  Wt 220 lb (99.791 kg)  BMI 40.23 kg/m2  SpO2 94%  General:  alert, cooperative and no distress  Oropharynx: normal findings: lips normal without lesions and gums healthy   Eyes:  negative findings: lids and lashes normal and corneas clear   Ears:  external ears normal        Lung: clear to auscultation bilaterally  Heart:  regular rate and rhythm     Extremities: edema bilaterally  Skin: dry     Neuro: mental status, speech normal, alert and oriented x3   Lab Review Glucose (mg/dL)  Date Value  08/03/2013 138*  03/27/2013 217*     Glucose, Bld (mg/dL)  Date Value  03/01/2012 212*  02/29/2012 249*  02/28/2012 139*     CO2 (mmol/L)  Date Value  08/03/2013 24   03/27/2013 24   03/01/2012 28      BUN (mg/dL)  Date Value  08/03/2013 19   03/27/2013 18   03/01/2012 22   02/29/2012 17   02/28/2012 15      Creatinine, Ser (mg/dL)  Date Value  08/03/2013 1.32*  03/27/2013 1.78*  03/01/2012 1.32*    08/03/13: A1C:  7.4% AST:  16 ALT:  9 Microalbmin:  181.9  Assessment:    Diabetes Mellitus type II, under good control.  BP at goal <140/80    Plan:    1.  Rx changes: Go back to taking Humulin 70/30 - 35 units every morning. 2.  Continue Metformin. 3.  Microalbumin is high  - on enalapril. 4.  Counseled on nutrition goals. 5.  Counseled on benefit of routine exercise. 6.  HTN at goal on enalapril, amlodipine, metoprolol, and clonidine.

## 2013-08-07 NOTE — Patient Instructions (Signed)
Go back to Humulin 70/30 - 35 units once daily

## 2013-08-08 ENCOUNTER — Other Ambulatory Visit: Payer: Self-pay | Admitting: *Deleted

## 2013-09-22 DIAGNOSIS — M47812 Spondylosis without myelopathy or radiculopathy, cervical region: Secondary | ICD-10-CM | POA: Diagnosis not present

## 2013-10-11 ENCOUNTER — Other Ambulatory Visit: Payer: Self-pay | Admitting: Internal Medicine

## 2013-10-18 ENCOUNTER — Ambulatory Visit: Payer: Medicare Other | Admitting: Internal Medicine

## 2013-10-19 DIAGNOSIS — H4011X0 Primary open-angle glaucoma, stage unspecified: Secondary | ICD-10-CM | POA: Diagnosis not present

## 2013-10-19 DIAGNOSIS — H409 Unspecified glaucoma: Secondary | ICD-10-CM | POA: Diagnosis not present

## 2013-10-24 ENCOUNTER — Encounter: Payer: Self-pay | Admitting: Internal Medicine

## 2013-10-24 ENCOUNTER — Ambulatory Visit (INDEPENDENT_AMBULATORY_CARE_PROVIDER_SITE_OTHER): Payer: Medicare Other | Admitting: Internal Medicine

## 2013-10-24 VITALS — BP 142/86 | HR 61 | Temp 98.0°F | Resp 16 | Wt 220.1 lb

## 2013-10-24 DIAGNOSIS — G909 Disorder of the autonomic nervous system, unspecified: Secondary | ICD-10-CM

## 2013-10-24 DIAGNOSIS — Z23 Encounter for immunization: Secondary | ICD-10-CM

## 2013-10-24 DIAGNOSIS — J449 Chronic obstructive pulmonary disease, unspecified: Secondary | ICD-10-CM | POA: Diagnosis not present

## 2013-10-24 DIAGNOSIS — F32A Depression, unspecified: Secondary | ICD-10-CM

## 2013-10-24 DIAGNOSIS — K219 Gastro-esophageal reflux disease without esophagitis: Secondary | ICD-10-CM

## 2013-10-24 DIAGNOSIS — M129 Arthropathy, unspecified: Secondary | ICD-10-CM

## 2013-10-24 DIAGNOSIS — N058 Unspecified nephritic syndrome with other morphologic changes: Secondary | ICD-10-CM

## 2013-10-24 DIAGNOSIS — F3289 Other specified depressive episodes: Secondary | ICD-10-CM

## 2013-10-24 DIAGNOSIS — M1990 Unspecified osteoarthritis, unspecified site: Secondary | ICD-10-CM

## 2013-10-24 DIAGNOSIS — E876 Hypokalemia: Secondary | ICD-10-CM

## 2013-10-24 DIAGNOSIS — E1143 Type 2 diabetes mellitus with diabetic autonomic (poly)neuropathy: Secondary | ICD-10-CM | POA: Insufficient documentation

## 2013-10-24 DIAGNOSIS — E1149 Type 2 diabetes mellitus with other diabetic neurological complication: Secondary | ICD-10-CM | POA: Diagnosis not present

## 2013-10-24 DIAGNOSIS — J4489 Other specified chronic obstructive pulmonary disease: Secondary | ICD-10-CM

## 2013-10-24 DIAGNOSIS — IMO0002 Reserved for concepts with insufficient information to code with codable children: Secondary | ICD-10-CM

## 2013-10-24 DIAGNOSIS — I10 Essential (primary) hypertension: Secondary | ICD-10-CM | POA: Diagnosis not present

## 2013-10-24 DIAGNOSIS — E1129 Type 2 diabetes mellitus with other diabetic kidney complication: Secondary | ICD-10-CM

## 2013-10-24 DIAGNOSIS — F329 Major depressive disorder, single episode, unspecified: Secondary | ICD-10-CM

## 2013-10-24 DIAGNOSIS — F411 Generalized anxiety disorder: Secondary | ICD-10-CM

## 2013-10-24 MED ORDER — SAXAGLIPTIN HCL 2.5 MG PO TABS: 2.5000 mg | ORAL_TABLET | Freq: Every day | ORAL | Status: DC

## 2013-10-24 MED ORDER — GABAPENTIN 100 MG PO CAPS: ORAL_CAPSULE | ORAL | Status: DC

## 2013-10-24 NOTE — Patient Instructions (Signed)
STOP TAKING YOUR METFORMIN  You have been started on new diabetes medication  Your dose for neurontin has been increased to three times a day from twice a day

## 2013-10-24 NOTE — Progress Notes (Signed)
Patient ID: Michelle Buck, female   DOB: 04/06/1936, 77 y.o.   MRN: [id number]     Chief Complaint  Patient presents with  . Medical Managment of Chronic Issues    4 month f/u  . Immunizations    will get flu vaccine today    Allergies  Allergen Reactions  . Codeine Nausea And Vomiting    Patient states N/V with codeine  . Hydralazine   . Hydrocodone Nausea And Vomiting  . Oxycodone Other (See Comments)    Patient states she can tolerate oxycodone  . Tylenol [Acetaminophen]   . Penicillins Rash    Patient states rash/itch with penicillin    HPI Here for routine visit. Her cbg ranging between 100-140. No hypoglycemia. Appetite is good. Denies polydypsia or polyuria Energy level is low Feels low in term of her mood and feels everything around be slow. Does not want her mood medications adjusted/ altered for now follows with orthopedics for her arthritis and sciatica Anxiety has been stable  Review of Systems  Constitutional: Negative for fever, chills, weight loss and diaphoresis.  HENT: Negative for congestion, hearing loss and sore throat.   Eyes: Negative for blurred vision, double vision and discharge.  Respiratory: Negative for cough, sputum production, shortness of breath and wheezing.   Cardiovascular: Negative for chest pain, palpitations, orthopnea and leg swelling.  Gastrointestinal: Negative for heartburn, nausea, vomiting, abdominal pain, diarrhea and constipation.  Genitourinary: positive for dysuria and urgency for a week. No hematuria. Negative for frequency and flank pain.  Musculoskeletal: Negative for falls and myalgias. has joint pain and low back pain Skin: Negative for itching and rash.  Neurological: Negative for dizziness, tingling, focal weakness and headaches. has neuropathy Psychiatric/Behavioral: Negative for memory loss, nervous/anxious.    Past Medical History  Diagnosis Date  . Diabetes mellitus   . Hypertension   . Obese   . Hiatal hernia     . Scoliosis   . Arthritis   . Acute bronchitis   . COPD (chronic obstructive pulmonary disease)   . Unspecified hereditary and idiopathic peripheral neuropathy   . Secondary diabetes mellitus with renal manifestations, not stated as uncontrolled, or unspecified(249.40)   . Chronic kidney disease, stage II (mild)   . Chronic kidney disease, stage II (mild)   . Chronic kidney disease, unspecified   . Disorder of bone and cartilage, unspecified   . Complications affecting other specified body systems, hypertension   . Hypopotassemia   . Anxiety   . Congestive heart failure, unspecified   . GERD (gastroesophageal reflux disease)   . Lumbago   . Insomnia, unspecified   . Hyperlipidemia   . Unspecified glaucoma(365.9)   . Diaphragmatic hernia without mention of obstruction or gangrene   . Osteoarthrosis, unspecified whether generalized or localized, unspecified site    Current Outpatient Prescriptions on File Prior to Visit  Medication Sig Dispense Refill  . ADVAIR DISKUS 250-50 MCG/DOSE AEPB One puff once daily      . ALPRAZolam (XANAX) 0.25 MG tablet Take 1 tablet once a day as needed for anxiety/nerves  30 tablet  2  . amLODipine (NORVASC) 10 MG tablet Take 10 mg by mouth daily.        Marland Kitchen aspirin 81 MG tablet Take 81 mg by mouth daily.        . cilostazol (PLETAL) 100 MG tablet Take one tablet once daily      . cloNIDine (CATAPRES) 0.1 MG tablet TAKE 1 TABLET BY MOUTH DAILY  30  tablet  3  . cyclobenzaprine (FLEXERIL) 10 MG tablet       . cycloSPORINE (RESTASIS) 0.05 % ophthalmic emulsion 1 drop 2 (two) times daily. One drop once a day bilaterally for dry eyes      . EASY TOUCH INSULIN SYRINGE 31G X 5/16" 0.5 ML MISC USE UP TO 2 TIMES A DAY AS DIRECTED  100 each  5  . enalapril (VASOTEC) 5 MG tablet TAKE 1 TABLET BY MOUTH DAILY  30 tablet  3  . etodolac (LODINE XL) 400 MG 24 hr tablet Take one tablet once daily      . furosemide (LASIX) 20 MG tablet TAKE 1 TABLET BY MOUTH DAILY  30  tablet  5  . HYDROcodone-acetaminophen (NORCO) 10-325 MG per tablet Take one tablet every eight hours for pain      . insulin NPH-regular (HUMULIN 70/30) (70-30) 100 UNIT/ML injection Inject 35 Units into the skin daily with breakfast.  10 mL  6  . metoprolol succinate (TOPROL-XL) 100 MG 24 hr tablet TAKE 1 TABLET BY MOUTH DAILY  30 tablet  3  . NEXIUM 40 MG capsule TAKE 1 CAPSULE BY MOUTH DAILY  30 capsule  5  . potassium chloride SA (K-DUR,KLOR-CON) 20 MEQ tablet TAKE 1 TABLET BY MOUTH EVERY DAY WITH FOOD  30 tablet  5  . sertraline (ZOLOFT) 50 MG tablet TAKE 1 TABLET BY MOUTH DAILY  30 tablet  5  . simvastatin (ZOCOR) 20 MG tablet TAKE 1 TABLET BY MOUTH EVERY EVENING  30 tablet  5  . sucralfate (CARAFATE) 1 G tablet TAKE 1 TABLET BY MOUTH TWICE DAILY ON EMPTY STOMACH  60 tablet  3  . timolol (TIMOPTIC) 0.5 % ophthalmic solution 1 drop 2 (two) times daily. One drop in bilaterally twice a day as needed      . Travoprost, BAK Free, (TRAVATAN) 0.004 % SOLN ophthalmic solution 1 drop at bedtime. One drop bilaterally twice a day      . VENTOLIN HFA 108 (90 BASE) MCG/ACT inhaler INHALE 1 PUFF 4 TIMES A DAY FOR 5 DAYS THEN USE EVERY 6 HOURS AS NEEDED  18 each  3   No current facility-administered medications on file prior to visit.   Physical exam  BP 142/86  Pulse 61  Temp(Src) 98 F (36.7 C) (Oral)  Resp 16  Wt 220 lb 1.6 oz (99.837 kg)  SpO2 96%  General- elderly female in no acute distress, obese Head- atraumatic, normocephalic Eyes- PERRLA, EOMI, no pallor, no icterus, no discharge Neck- no lymphadenopathy, no thyromegaly, no jugular vein distension, no carotid bruit Chest- no chest wall deformities, no chest wall tenderness Cardiovascular- normal s1,s2, no murmurs/ rubs/ gallops Respiratory- bilateral clear to auscultation, no wheeze, no rhonchi, no crackles Abdomen- bowel sounds present, soft, non tender Musculoskeletal- able to move all 4 extremities, limited ROM in left arm with  abduction and external rotation, using rollator walker with seat Neurological- no focal deficit, normal vibration and pin prick sensation Psychiatry- alert and oriented to person, place and time, normal mood and affect Skin- normal foot exam  Labs- CBC    Component Value Date/Time   WBC 7.8 02/29/2012 0328   RBC 4.36 02/29/2012 0328   HGB 11.9* 02/29/2012 0328   HCT 35.9* 02/29/2012 0328   PLT 256 02/29/2012 0328   MCV 82.3 02/29/2012 0328   MCH 27.3 02/29/2012 0328   MCHC 33.1 02/29/2012 0328   RDW 14.1 02/29/2012 0328   LYMPHSABS 2.2 02/28/2012 1601  MONOABS 0.4 02/28/2012 1601   EOSABS 0.2 02/28/2012 1601   BASOSABS 0.0 02/28/2012 1601    CMP     Component Value Date/Time   NA 142 08/03/2013 0823   NA 138 03/01/2012 1245   K 3.8 08/03/2013 0823   CL 97 08/03/2013 0823   CO2 24 08/03/2013 0823   GLUCOSE 138* 08/03/2013 0823   GLUCOSE 212* 03/01/2012 1245   BUN 19 08/03/2013 0823   BUN 22 03/01/2012 1245   CREATININE 1.32* 08/03/2013 0823   CALCIUM 9.8 08/03/2013 0823   CALCIUM 8.6 05/14/2010 2347   PROT 7.0 08/03/2013 0823   PROT 7.4 02/29/2012 0328   ALBUMIN 3.4* 02/29/2012 0328   AST 16 08/03/2013 0823   ALT 9 08/03/2013 0823   ALKPHOS 101 08/03/2013 0823   BILITOT 0.3 08/03/2013 0823   GFRNONAA 39* 08/03/2013 0823   GFRAA 46* 08/03/2013 0823    Lab Results  Component Value Date   HGBA1C 7.4* 08/03/2013   Lipid Panel     Component Value Date/Time   CHOL 144 05/14/2010 2347   TRIG 156* 03/27/2013 0904   HDL 50 03/27/2013 0904   HDL 48 05/14/2010 2347   CHOLHDL 2.6 03/27/2013 0904   CHOLHDL 3.0 Ratio 05/14/2010 2347   VLDL 40 05/14/2010 2347   LDLCALC 49 03/27/2013 0904   LDLCALC 56 05/14/2010 2347    Assessment/plan  1. Need for prophylactic vaccination and inoculation against influenza Flu vaccine provided  2. Peripheral autonomic neuropathy due to diabetes mellitus Worsened. Increased frequency of gabapentin. Reassess. Foot care - gabapentin (NEURONTIN) 100 MG capsule; Take one tablet three times a day   Dispense: 60 capsule; Refill: 3  3. Essential hypertension, benign Continue current bp meds, no changes made, continue kcl supplement  4. COPD (chronic obstructive pulmonary disease) Stable, reviewed and continue current breathing regimen  5. DM type 2, uncontrolled, with renal complications Worsened renal fucntion. Has microalbuminuri. Continue ACEI. Continue humulin and will d/c metformin given her renal impairment. Started onglyza 2.5 mg daily for now - saxagliptin HCl (ONGLYZA) 2.5 MG TABS tablet; Take 1 tablet (2.5 mg total) by mouth daily.  Dispense: 30 tablet; Refill: 3 - Lipid Panel - Hemoglobin [id number] - Basic Metabolic Panel - CBC with Differential  6. Arthritis Continue her norco and follow with orthopedic  7. Hypokalemia Continue kcl supplement, check bmp  8. Generalized anxiety disorder Will continue zoloft and xanax, pt to notify of medication effect if sees no imporvement. Education about antidepressant and it helping with her mood provided. Pt also notified about warning signs with worsening mood  9. Depression See above  10. GERD (gastroesophageal reflux disease) continue PPI and sucralfate for now

## 2013-11-06 ENCOUNTER — Ambulatory Visit (INDEPENDENT_AMBULATORY_CARE_PROVIDER_SITE_OTHER): Payer: Medicare Other | Admitting: Pharmacotherapy

## 2013-11-06 ENCOUNTER — Other Ambulatory Visit: Payer: Self-pay | Admitting: *Deleted

## 2013-11-06 ENCOUNTER — Encounter: Payer: Self-pay | Admitting: Pharmacotherapy

## 2013-11-06 VITALS — BP 144/88 | HR 52 | Temp 97.3°F

## 2013-11-06 DIAGNOSIS — I10 Essential (primary) hypertension: Secondary | ICD-10-CM

## 2013-11-06 DIAGNOSIS — IMO0002 Reserved for concepts with insufficient information to code with codable children: Secondary | ICD-10-CM

## 2013-11-06 DIAGNOSIS — E1165 Type 2 diabetes mellitus with hyperglycemia: Secondary | ICD-10-CM

## 2013-11-06 DIAGNOSIS — E1129 Type 2 diabetes mellitus with other diabetic kidney complication: Secondary | ICD-10-CM

## 2013-11-06 DIAGNOSIS — N058 Unspecified nephritic syndrome with other morphologic changes: Secondary | ICD-10-CM

## 2013-11-06 NOTE — Progress Notes (Signed)
  Subjective:    Michelle Buck is a 77 y.o. African American  female who presents for follow-up of Type 2 diabetes mellitus.   Her last A1C was OK at 7.4% Currently taking 70/30 insulin 35 units daily and Onglyza 2.5mg  daily. SMBG logbook shows BG:    AM  99-146  PM:  76-142 No hypoglycemia.  Complaining of sciatic nerve pain. Feet and legs are painful. Denies vision problems. Nocturia every night.  She says her appetite is decreased, but food recall shows she is eating 3 full meals per day. Exercise is limited due to weakness and pain.  She is doing her chair exercise.  Review of Systems A comprehensive review of systems was negative except for: Constitutional: positive for overall weakness Genitourinary: positive for nocturia Musculoskeletal: positive for arthralgias and muscle weakness    Objective:    BP 144/88  Pulse 52  Temp(Src) 97.3 F (36.3 C) (Oral)  SpO2 96%  General:  alert, cooperative, no distress and moderately obese  Oropharynx: normal findings: lips normal without lesions and gums healthy   Eyes:  negative findings: lids and lashes normal and corneas clear   Ears:  external ears normal        Lung: clear to auscultation bilaterally  Heart:  regular rate and rhythm     Extremities: edema bilateraly - slight  Skin: dry     Neuro: mental status, speech normal, alert and oriented x3, loss of sensation in  right and left foot on 10 gm monofilament exam and uses a walker for assisted ambulation   Lab Review Glucose (mg/dL)  Date Value  08/03/2013 138*  03/27/2013 217*     Glucose, Bld (mg/dL)  Date Value  03/01/2012 212*  02/29/2012 249*  02/28/2012 139*     CO2 (mmol/L)  Date Value  08/03/2013 24   03/27/2013 24   03/01/2012 28      BUN (mg/dL)  Date Value  08/03/2013 19   03/27/2013 18   03/01/2012 22   02/29/2012 17   02/28/2012 15      Creatinine, Ser (mg/dL)  Date Value  08/03/2013 1.32*  03/27/2013 1.78*  03/01/2012 1.32*       Assessment:    Diabetes Mellitus type II, under good control.  BP goal <140/80   Plan:    1.  Rx changes: none 2.  Continue Insulin 70/30 - inject 35 units daily.  Continue Onglyza 2.5mg  daily. 3.  Counseled on nutrition goals. 4.  Counseled on benefit of routine exercise.  Praised current efforts. 5.  BP slightly above goal <140/80.  Usually OK.  Home BP consistently <140/80. 6.  Continue enalapril, amlodipine, metoprolol, clonidine, and furosemide.  Will monitor BP for now as she is already on 5 RX. 7.  RTC in 3 months  - will check A1C, CMP, lipid, microalbumin.

## 2013-11-06 NOTE — Patient Instructions (Signed)
Continue same medications. Come for labs prior to next office visit.

## 2013-11-08 ENCOUNTER — Other Ambulatory Visit: Payer: Self-pay | Admitting: Nurse Practitioner

## 2013-12-02 ENCOUNTER — Other Ambulatory Visit: Payer: Self-pay | Admitting: Internal Medicine

## 2013-12-08 ENCOUNTER — Other Ambulatory Visit: Payer: Self-pay | Admitting: *Deleted

## 2013-12-08 DIAGNOSIS — M479 Spondylosis, unspecified: Secondary | ICD-10-CM | POA: Diagnosis not present

## 2013-12-29 ENCOUNTER — Other Ambulatory Visit: Payer: Self-pay | Admitting: Internal Medicine

## 2014-01-01 NOTE — Telephone Encounter (Signed)
Spoke with patient, patient states her sister is in charge of her medications. I gave patient number to have her sister contact us when available to discuss refill request (medication is not on medication list)

## 2014-01-03 ENCOUNTER — Other Ambulatory Visit: Payer: Self-pay | Admitting: Internal Medicine

## 2014-01-03 NOTE — Telephone Encounter (Signed)
Patient's sister never returned call. I will send message back to pharmacy indicating patient needs to contact provider

## 2014-01-15 DIAGNOSIS — H409 Unspecified glaucoma: Secondary | ICD-10-CM | POA: Diagnosis not present

## 2014-01-15 DIAGNOSIS — H4011X0 Primary open-angle glaucoma, stage unspecified: Secondary | ICD-10-CM | POA: Diagnosis not present

## 2014-01-24 ENCOUNTER — Encounter: Payer: Self-pay | Admitting: Internal Medicine

## 2014-01-24 ENCOUNTER — Ambulatory Visit (INDEPENDENT_AMBULATORY_CARE_PROVIDER_SITE_OTHER): Payer: Medicare Other | Admitting: Internal Medicine

## 2014-01-24 ENCOUNTER — Ambulatory Visit: Payer: Medicare Other | Admitting: Internal Medicine

## 2014-01-24 VITALS — BP 148/84 | HR 59 | Resp 10 | Wt 204.0 lb

## 2014-01-24 DIAGNOSIS — J449 Chronic obstructive pulmonary disease, unspecified: Secondary | ICD-10-CM | POA: Diagnosis not present

## 2014-01-24 DIAGNOSIS — E1129 Type 2 diabetes mellitus with other diabetic kidney complication: Secondary | ICD-10-CM

## 2014-01-24 DIAGNOSIS — M1990 Unspecified osteoarthritis, unspecified site: Secondary | ICD-10-CM

## 2014-01-24 DIAGNOSIS — E1143 Type 2 diabetes mellitus with diabetic autonomic (poly)neuropathy: Secondary | ICD-10-CM

## 2014-01-24 DIAGNOSIS — I498 Other specified cardiac arrhythmias: Secondary | ICD-10-CM

## 2014-01-24 DIAGNOSIS — IMO0002 Reserved for concepts with insufficient information to code with codable children: Secondary | ICD-10-CM

## 2014-01-24 DIAGNOSIS — R001 Bradycardia, unspecified: Secondary | ICD-10-CM

## 2014-01-24 DIAGNOSIS — M129 Arthropathy, unspecified: Secondary | ICD-10-CM | POA: Diagnosis not present

## 2014-01-24 DIAGNOSIS — I10 Essential (primary) hypertension: Secondary | ICD-10-CM | POA: Diagnosis not present

## 2014-01-24 DIAGNOSIS — G909 Disorder of the autonomic nervous system, unspecified: Secondary | ICD-10-CM

## 2014-01-24 DIAGNOSIS — E1149 Type 2 diabetes mellitus with other diabetic neurological complication: Secondary | ICD-10-CM

## 2014-01-24 DIAGNOSIS — E1165 Type 2 diabetes mellitus with hyperglycemia: Principal | ICD-10-CM

## 2014-01-24 MED ORDER — SAXAGLIPTIN HCL 2.5 MG PO TABS: 2.5000 mg | ORAL_TABLET | Freq: Every day | ORAL | Status: DC

## 2014-01-24 MED ORDER — METOPROLOL SUCCINATE ER 50 MG PO TB24: 50.0000 mg | ORAL_TABLET | Freq: Every day | ORAL | Status: DC

## 2014-01-24 MED ORDER — TRAMADOL HCL 50 MG PO TABS: 50.0000 mg | ORAL_TABLET | Freq: Two times a day (BID) | ORAL | Status: DC | PRN

## 2014-01-24 MED ORDER — HYDRALAZINE HCL 10 MG PO TABS: 10.0000 mg | ORAL_TABLET | Freq: Three times a day (TID) | ORAL | Status: DC

## 2014-01-24 NOTE — Patient Instructions (Signed)
We have decreased the dose of your metoprolol to 50 mg daily instead of 100 mg daily  New bp medication has been started- hydralazine and take it one tablet twice a day  New pain medication has been started and take it twice a day as needed for pain

## 2014-01-24 NOTE — Progress Notes (Signed)
Patient ID: Michelle Buck, female   DOB: 03-13-1936, 78 y.o.   MRN: [id number]    Chief Complaint  Patient presents with  . Medical Managment of Chronic Issues    3 month follow-up    Allergies  Allergen Reactions  . Codeine Nausea And Vomiting    Patient states N/V with codeine  . Hydrocodone Nausea And Vomiting  . Oxycodone Other (See Comments)    Patient states she can tolerate oxycodone  . Tylenol [Acetaminophen]   . Penicillins Rash    Patient states rash/itch with penicillin   HPI 78 y/o female pt here for routine visit. Her leg pain has been bothering her.cbg this am was 189 and cbg ranging between 110-250.  bp at home reviewed- SBP 122-150 and DBP 62-98  Review of Systems  Constitutional: Negative for fever, chills, weight loss and diaphoresis.  HENT: Negative for congestion, hearing loss and sore throat.   Eyes: Negative for blurred vision, double vision and discharge.  Respiratory: Negative for cough, sputum production, shortness of breath and wheezing.   Cardiovascular: Negative for chest pain, palpitations, orthopnea and leg swelling.  Gastrointestinal: Negative for heartburn, nausea, vomiting, abdominal pain, diarrhea and constipation.  Genitourinary: positive for dysuria and urgency for a week. No hematuria. Negative for frequency and flank pain.  Musculoskeletal: Negative for falls and myalgias. has joint pain and low back pain Skin: Negative for itching and rash.  Neurological: Negative for dizziness, tingling, focal weakness and headaches. has neuropathy Psychiatric/Behavioral: Negative for memory loss, nervous/anxious  Physical exam BP 148/84  Pulse 59  Resp 10  Wt 204 lb (92.534 kg)  SpO2 95%  General- elderly female in no acute distress, obese Head- atraumatic, normocephalic Eyes- PERRLA, EOMI, no pallor, no icterus, no discharge Neck- no lymphadenopathy, no thyromegaly, no jugular vein distension, no carotid bruit Chest- no chest wall deformities, no  chest wall tenderness Cardiovascular- normal s1,s2, no murmurs/ rubs/ gallops Respiratory- bilateral clear to auscultation, no wheeze, no rhonchi, no crackles Abdomen- bowel sounds present, soft, non tender Musculoskeletal- able to move all 4 extremities, limited ROM in left arm with abduction and external rotation, using rollator walker with seat Neurological- no focal deficit, normal vibration and pin prick sensation Psychiatry- alert and oriented to person, place and time, normal mood and affect Skin- normal foot exam  Labs-  CMP     Component Value Date/Time   NA 142 08/03/2013 0823   NA 138 03/01/2012 1245   K 3.8 08/03/2013 0823   CL 97 08/03/2013 0823   CO2 24 08/03/2013 0823   GLUCOSE 138* 08/03/2013 0823   GLUCOSE 212* 03/01/2012 1245   BUN 19 08/03/2013 0823   BUN 22 03/01/2012 1245   CREATININE 1.32* 08/03/2013 0823   CALCIUM 9.8 08/03/2013 0823   CALCIUM 8.6 05/14/2010 2347   PROT 7.0 08/03/2013 0823   PROT 7.4 02/29/2012 0328   ALBUMIN 3.4* 02/29/2012 0328   AST 16 08/03/2013 0823   ALT 9 08/03/2013 0823   ALKPHOS 101 08/03/2013 0823   BILITOT 0.3 08/03/2013 0823   GFRNONAA 39* 08/03/2013 0823   GFRAA 46* 08/03/2013 0823   Lab Results  Component Value Date   HGBA1C 7.4* 08/03/2013    Assessment/plan  1. DM type 2, uncontrolled, with renal complications Continue onglyza and humulin 70/30 for now - saxagliptin HCl (ONGLYZA) 2.5 MG TABS tablet; Take 1 tablet (2.5 mg total) by mouth daily.  Dispense: 30 tablet; Refill: 3 - Basic Metabolic Panel - Hemoglobin A1c  2. Essential  hypertension, benign Mild elevated readings. Will decrease toprol to 50 mg daily, continue lisinopril, clonidine, amlodipine. Introduce hydralazine 10 mg tid  3. COPD (chronic obstructive pulmonary disease) Stable. Continue advair and ventolin  4. Arthritis Continue voltaren gel. Will add tramadol   5. Peripheral autonomic neuropathy due to diabetes mellitus Continue gabapentin 100 mg tid and tramadol dose adjusted  6.  Bradycardia Change toprol to 50 mg daily due to slow heart rate and monitor. Warning signs with symptoms explained to patient

## 2014-01-25 LAB — BASIC METABOLIC PANEL
BUN/Creatinine Ratio: 19 (ref 11–26)
BUN: 24 mg/dL (ref 8–27)
CALCIUM: 9.6 mg/dL (ref 8.7–10.3)
CHLORIDE: 102 mmol/L (ref 97–108)
CO2: 27 mmol/L (ref 18–29)
Creatinine, Ser: 1.29 mg/dL — ABNORMAL HIGH (ref 0.57–1.00)
GFR calc non Af Amer: 41 mL/min/{1.73_m2} — ABNORMAL LOW (ref >59)
GFR, EST AFRICAN AMERICAN: 47 mL/min/{1.73_m2} — AB (ref >59)
GLUCOSE: 94 mg/dL (ref 65–99)
POTASSIUM: 4.5 mmol/L (ref 3.5–5.2)
Sodium: 145 mmol/L — ABNORMAL HIGH (ref 134–144)

## 2014-01-25 LAB — HEMOGLOBIN A1C
Est. average glucose Bld gHb Est-mCnc: 143 mg/dL
HEMOGLOBIN A1C: 6.6 % — AB (ref 4.8–5.6)

## 2014-01-29 ENCOUNTER — Telehealth: Payer: Self-pay | Admitting: *Deleted

## 2014-01-29 ENCOUNTER — Other Ambulatory Visit: Payer: Self-pay | Admitting: Internal Medicine

## 2014-01-29 NOTE — Telephone Encounter (Signed)
Pt notified VIA phone regarding her last lab results.  She is agreeable to recommendations.

## 2014-01-30 ENCOUNTER — Telehealth: Payer: Self-pay | Admitting: *Deleted

## 2014-01-30 DIAGNOSIS — IMO0002 Reserved for concepts with insufficient information to code with codable children: Secondary | ICD-10-CM

## 2014-01-30 DIAGNOSIS — E1129 Type 2 diabetes mellitus with other diabetic kidney complication: Secondary | ICD-10-CM

## 2014-01-30 DIAGNOSIS — E1165 Type 2 diabetes mellitus with hyperglycemia: Principal | ICD-10-CM

## 2014-01-30 MED ORDER — SAXAGLIPTIN HCL 2.5 MG PO TABS: 2.5000 mg | ORAL_TABLET | Freq: Every day | ORAL | Status: DC

## 2014-01-30 NOTE — Telephone Encounter (Signed)
Pt's sister called regarding lab results.  rx for Onglyza sent to [facility] per request.  **  ALL prescriptions need sent to [facility] (they deliver). **

## 2014-01-31 ENCOUNTER — Other Ambulatory Visit: Payer: Self-pay | Admitting: Internal Medicine

## 2014-01-31 ENCOUNTER — Other Ambulatory Visit: Payer: Self-pay | Admitting: Nurse Practitioner

## 2014-01-31 ENCOUNTER — Other Ambulatory Visit: Payer: Self-pay | Admitting: Pharmacotherapy

## 2014-02-05 ENCOUNTER — Ambulatory Visit (INDEPENDENT_AMBULATORY_CARE_PROVIDER_SITE_OTHER): Payer: Medicare Other | Admitting: Pharmacotherapy

## 2014-02-05 ENCOUNTER — Encounter: Payer: Self-pay | Admitting: Pharmacotherapy

## 2014-02-05 VITALS — BP 138/80 | HR 54 | Wt 212.0 lb

## 2014-02-05 DIAGNOSIS — I10 Essential (primary) hypertension: Secondary | ICD-10-CM | POA: Diagnosis not present

## 2014-02-05 DIAGNOSIS — E1165 Type 2 diabetes mellitus with hyperglycemia: Secondary | ICD-10-CM

## 2014-02-05 DIAGNOSIS — E1129 Type 2 diabetes mellitus with other diabetic kidney complication: Secondary | ICD-10-CM | POA: Diagnosis not present

## 2014-02-05 DIAGNOSIS — IMO0002 Reserved for concepts with insufficient information to code with codable children: Secondary | ICD-10-CM

## 2014-02-05 NOTE — Patient Instructions (Signed)
Keep up the good work

## 2014-02-05 NOTE — Progress Notes (Signed)
  Subjective:    Michelle Buck is a 78 y.o. female who presents for follow-up of Type 2 diabetes mellitus.   A1C now at 6.6% Average BG: 130mg /dl Hypoglycemia x 1 (lowest BG 44)  Difficulty sleeping.  She wants a hospital bed.  Can't get comfortable.  She has contacted Dr. Eulas Post. Continues to have pain in feet. Her eyes are dry.  Vision is OK. Nocturia every hour. She does have peripheral edema in the afternoon.  She is trying to make healthy food choices.  No added sodium.  Cutting back on her "sweets". No routine exercise.   Review of Systems A comprehensive review of systems was negative except for: Eyes: positive for dry eyes Genitourinary: positive for nocturia    Objective:    BP 138/80  Pulse 54  Wt 212 lb (96.163 kg)  SpO2 98%  General:  alert, cooperative, no distress and morbidly obese  Oropharynx: normal findings: lips normal without lesions and gums healthy   Eyes:  negative findings: lids and lashes normal and conjunctivae and sclerae normal   Ears:  external ears normal        Lung: clear to auscultation bilaterally  Heart:  regular rate and rhythm     Extremities: edema bilateral lower extremeties  Skin: dry     Neuro: mental status, speech normal, alert and oriented x3 and using a seated walker to assist with ambulaton   Lab Review Glucose (mg/dL)  Date Value  01/24/2014 94   08/03/2013 138*  03/27/2013 217*     Glucose, Bld (mg/dL)  Date Value  03/01/2012 212*  02/29/2012 249*  02/28/2012 139*     CO2 (mmol/L)  Date Value  01/24/2014 27   08/03/2013 24   03/27/2013 24      BUN (mg/dL)  Date Value  01/24/2014 24   08/03/2013 19   03/27/2013 18   03/01/2012 22   02/29/2012 17   02/28/2012 15      Creatinine, Ser (mg/dL)  Date Value  01/24/2014 1.29*  08/03/2013 1.32*  03/27/2013 1.78*    A1C 6.6%  Assessment:    Diabetes Mellitus type II, under good control. A1C at goal <7%. HTN at goal <140/90   Plan:    1.  Rx changes: none 2.  Continue  Tradjenta 5mg  daily. 3.  Continue Humulin 70/30 - 35 units every morning. 4.  Counseled on nutrition goals. 5.  Needs routine exercise.  Goal is 30-45 minutes 5 x week. 6.  HTN at goal <140/90

## 2014-02-09 DIAGNOSIS — M19019 Primary osteoarthritis, unspecified shoulder: Secondary | ICD-10-CM | POA: Diagnosis not present

## 2014-02-21 ENCOUNTER — Ambulatory Visit (INDEPENDENT_AMBULATORY_CARE_PROVIDER_SITE_OTHER): Payer: Medicare Other | Admitting: Internal Medicine

## 2014-02-21 ENCOUNTER — Encounter: Payer: Self-pay | Admitting: Internal Medicine

## 2014-02-21 VITALS — BP 140/76 | HR 56 | Resp 12 | Wt 225.0 lb

## 2014-02-21 DIAGNOSIS — G909 Disorder of the autonomic nervous system, unspecified: Secondary | ICD-10-CM

## 2014-02-21 DIAGNOSIS — E1149 Type 2 diabetes mellitus with other diabetic neurological complication: Secondary | ICD-10-CM

## 2014-02-21 DIAGNOSIS — R001 Bradycardia, unspecified: Secondary | ICD-10-CM

## 2014-02-21 DIAGNOSIS — I10 Essential (primary) hypertension: Secondary | ICD-10-CM | POA: Diagnosis not present

## 2014-02-21 DIAGNOSIS — K5900 Constipation, unspecified: Secondary | ICD-10-CM

## 2014-02-21 DIAGNOSIS — E1129 Type 2 diabetes mellitus with other diabetic kidney complication: Secondary | ICD-10-CM | POA: Diagnosis not present

## 2014-02-21 DIAGNOSIS — E785 Hyperlipidemia, unspecified: Secondary | ICD-10-CM | POA: Diagnosis not present

## 2014-02-21 DIAGNOSIS — F329 Major depressive disorder, single episode, unspecified: Secondary | ICD-10-CM | POA: Diagnosis not present

## 2014-02-21 DIAGNOSIS — I498 Other specified cardiac arrhythmias: Secondary | ICD-10-CM | POA: Diagnosis not present

## 2014-02-21 DIAGNOSIS — F3289 Other specified depressive episodes: Secondary | ICD-10-CM | POA: Diagnosis not present

## 2014-02-21 DIAGNOSIS — E1143 Type 2 diabetes mellitus with diabetic autonomic (poly)neuropathy: Secondary | ICD-10-CM

## 2014-02-21 MED ORDER — DICLOFENAC SODIUM 1 % TD GEL: 4.0000 g | Freq: Two times a day (BID) | TRANSDERMAL | Status: DC

## 2014-02-21 MED ORDER — LINACLOTIDE 145 MCG PO CAPS: 145.0000 ug | ORAL_CAPSULE | Freq: Every day | ORAL | Status: DC

## 2014-02-21 NOTE — Patient Instructions (Addendum)
Stop ONGLYZA (sitagliptin)  Stop metoprolol 100 mg tablet. Take only 50 mg tablet daily  Stop etodolac  Check blood pressure once a day.  I will see bp reading from home in 2 weeks  New medication has been added for your constipation

## 2014-02-21 NOTE — Progress Notes (Signed)
Patient ID: Michelle Buck, female   DOB: 25-Jul-1936, 78 y.o.   MRN: [id number]    Chief Complaint  Patient presents with  . Medical Managment of Chronic Issues    bp, leg pain, constipation  . Medication Refill    Renew Voltaren Gel    Allergies  Allergen Reactions  . Tramadol     Leg cramps   . Codeine Nausea And Vomiting    Patient states N/V with codeine  . Hydrocodone Nausea And Vomiting  . Oxycodone Other (See Comments)    Patient states she can tolerate oxycodone  . Tylenol [Acetaminophen]   . Penicillins Rash    Patient states rash/itch with penicillin   HPI 78 y/o female pt here for routine visit. She was seen last visit for slow heart rate and her metoprolol was decreased. But she continues to take 100 mg daily. Her leg pain has been bothering her. Reviewed home bp reading 112-146/ 58-72 and HR 45-53 cbg at home this am 193, other wise 60-170 She also has been constipated, has been taking OTC stool softener and this in not helping her Has gained 12 lbs since last visit She is unable to read  Review of Systems   Constitutional: Negative for fever, chills, diaphoresis.   HENT: Negative for congestion, hearing loss and sore throat.    Eyes: Negative for blurred vision, double vision and discharge.   Respiratory: Negative for cough, sputum production, shortness of breath and wheezing.    Cardiovascular: Negative for chest pain, palpitations, orthopnea and leg swelling.   Gastrointestinal: Negative for heartburn, nausea, vomiting, abdominal pain Genitourinary: positive for dysuria and urgency for a week. No hematuria. Negative for frequency and flank pain.   Musculoskeletal: Negative for falls and myalgias. has joint pain and low back pain. Also has leg pain Skin: Negative for itching and rash.   Neurological: Negative for dizziness, tingling, focal weakness and headaches. has neuropathy Psychiatric/Behavioral: Negative for memory loss, nervous/anxious  Past Medical  History  Diagnosis Date  . Diabetes mellitus   . Hypertension   . Obese   . Hiatal hernia   . Scoliosis   . Arthritis   . Acute bronchitis   . COPD (chronic obstructive pulmonary disease)   . Unspecified hereditary and idiopathic peripheral neuropathy   . Secondary diabetes mellitus with renal manifestations, not stated as uncontrolled, or unspecified   . Chronic kidney disease, stage II (mild)   . Chronic kidney disease, stage II (mild)   . Chronic kidney disease, unspecified   . Disorder of bone and cartilage, unspecified   . Complications affecting other specified body systems, hypertension   . Hypopotassemia   . Anxiety   . Congestive heart failure, unspecified   . GERD (gastroesophageal reflux disease)   . Lumbago   . Insomnia, unspecified   . Hyperlipidemia   . Unspecified glaucoma   . Diaphragmatic hernia without mention of obstruction or gangrene   . Osteoarthrosis, unspecified whether generalized or localized, unspecified site    Past Surgical History  Procedure Laterality Date  . Abdominal hysterectomy    . Shoulder surgery    . Total hip arthroplasty      bilateral  . Knee arthroscopy      bilateral  . Colonoscopy  08/06/2011   Current Outpatient Prescriptions on File Prior to Visit  Medication Sig Dispense Refill  . ADVAIR DISKUS 250-50 MCG/DOSE AEPB One puff once daily      . ALPRAZolam (XANAX) 0.25 MG tablet Take 1 tablet  once a day as needed for anxiety/nerves  30 tablet  2  . amLODipine (NORVASC) 10 MG tablet Take 10 mg by mouth daily.        Marland Kitchen aspirin 81 MG tablet Take 81 mg by mouth daily.        . cilostazol (PLETAL) 100 MG tablet Take one tablet once daily      . cloNIDine (CATAPRES) 0.1 MG tablet TAKE 1 TABLET BY MOUTH DAILY  30 tablet  2  . cycloSPORINE (RESTASIS) 0.05 % ophthalmic emulsion 1 drop 2 (two) times daily. One drop once a day bilaterally for dry eyes      . EASY TOUCH INSULIN SYRINGE 31G X 5/16" 0.5 ML MISC USE UP TO 2 TIMES A DAY AS  DIRECTED  100 each  5  . enalapril (VASOTEC) 5 MG tablet TAKE 1 TABLET BY MOUTH DAILY  30 tablet  0  . etodolac (LODINE) 400 MG tablet       . furosemide (LASIX) 20 MG tablet TAKE 1 TABLET BY MOUTH DAILY  30 tablet  5  . gabapentin (NEURONTIN) 100 MG capsule TAKE 1 CAPSULE BY MOUTH THREE TIMES A DAY  90 capsule  0  . HUMULIN 70/30 (70-30) 100 UNIT/ML injection INJECT 35 UNITS SUBCUTANEOUSLY WITH BREAKFAST  10 mL  0  . hydrALAZINE (APRESOLINE) 10 MG tablet Take 1 tablet (10 mg total) by mouth 3 (three) times daily.  90 tablet  0  . HYDROcodone-acetaminophen (NORCO) 10-325 MG per tablet       . metoprolol succinate (TOPROL-XL) 100 MG 24 hr tablet TAKE 1 TABLET BY MOUTH DAILY  30 tablet  0  . metoprolol succinate (TOPROL-XL) 50 MG 24 hr tablet Take 1 tablet (50 mg total) by mouth daily. Take with or immediately following a meal.  30 tablet  3  . NEXIUM 40 MG capsule TAKE 1 CAPSULE BY MOUTH DAILY  30 capsule  5  . potassium chloride SA (K-DUR,KLOR-CON) 20 MEQ tablet TAKE 1 TABLET BY MOUTH EVERY DAY WITH FOOD  30 tablet  5  . saxagliptin HCl (ONGLYZA) 2.5 MG TABS tablet Take 1 tablet (2.5 mg total) by mouth daily.  30 tablet  3  . sertraline (ZOLOFT) 50 MG tablet TAKE 1 TABLET BY MOUTH DAILY  30 tablet  5  . simvastatin (ZOCOR) 20 MG tablet TAKE 1 TABLET BY MOUTH EVERY EVENING  30 tablet  5  . sucralfate (CARAFATE) 1 G tablet TAKE 1 TABLET BY MOUTH TWICE DAILY ON EMPTY STOMACH  60 tablet  0  . timolol (TIMOPTIC) 0.5 % ophthalmic solution 1 drop 2 (two) times daily. One drop in bilaterally twice a day as needed      . Travoprost, BAK Free, (TRAVATAN) 0.004 % SOLN ophthalmic solution 1 drop at bedtime. One drop bilaterally twice a day      . VENTOLIN HFA 108 (90 BASE) MCG/ACT inhaler INHALE 1 PUFF 4 TIMES A DAY FOR 5 DAYS, THEN USE EVERY 6 HOURS AS NEEDED  18 each  3  . VOLTAREN 1 % GEL        No current facility-administered medications on file prior to visit.   Family History  Problem Relation Age  of Onset  . Diabetes Mother   . Heart disease Mother   . Heart disease Brother   . Diabetes Brother   . Kidney disease Father   . Scoliosis Sister   . Diabetes Brother   . Diabetes Brother   . Heart disease Brother   .  Scoliosis Brother   . Stroke Brother   . Heart attack Brother    Physical exam BP 140/76  Pulse 56  Resp 12  Wt 225 lb (102.059 kg)  SpO2 96%  General- elderly female in no acute distress, obese Head- atraumatic, normocephalic Eyes- PERRLA, EOMI, no pallor, no icterus, no discharge Neck- no lymphadenopathy, no thyromegaly, no jugular vein distension, no carotid bruit Chest- no chest wall deformities, no chest wall tenderness Cardiovascular- normal s1,s2, no murmurs/ rubs/ gallops Respiratory- bilateral clear to auscultation, no wheeze, no rhonchi, no crackles Abdomen- bowel sounds present, soft, non tender Musculoskeletal- able to move all 4 extremities, limited ROM in left arm with abduction and external rotation, using rollator walker with seat, 1+ edema in both lower legs Neurological- no focal deficit, normal vibration and pin prick sensation, no corns/ callus, no foot deformity, grown toe nails Psychiatry- alert and oriented to person, place and time, normal mood and affect Skin- normal skin, warm and dry  Labs-  Lab Results  Component Value Date   HGBA1C 6.6* 01/24/2014    CBC    Component Value Date/Time   WBC 7.8 02/29/2012 0328   RBC 4.36 02/29/2012 0328   HGB 11.9* 02/29/2012 0328   HCT 35.9* 02/29/2012 0328   PLT 256 02/29/2012 0328   MCV 82.3 02/29/2012 0328   MCH 27.3 02/29/2012 0328   MCHC 33.1 02/29/2012 0328   RDW 14.1 02/29/2012 0328   LYMPHSABS 2.2 02/28/2012 1601   MONOABS 0.4 02/28/2012 1601   EOSABS 0.2 02/28/2012 1601   BASOSABS 0.0 02/28/2012 1601    CMP     Component Value Date/Time   NA 145* 01/24/2014 1341   NA 138 03/01/2012 1245   K 4.5 01/24/2014 1341   CL 102 01/24/2014 1341   CO2 27 01/24/2014 1341   GLUCOSE 94 01/24/2014 1341    GLUCOSE 212* 03/01/2012 1245   BUN 24 01/24/2014 1341   BUN 22 03/01/2012 1245   CREATININE 1.29* 01/24/2014 1341   CALCIUM 9.6 01/24/2014 1341   CALCIUM 8.6 05/14/2010 2347   PROT 7.0 08/03/2013 0823   PROT 7.4 02/29/2012 0328   ALBUMIN 3.4* 02/29/2012 0328   AST 16 08/03/2013 0823   ALT 9 08/03/2013 0823   ALKPHOS 101 08/03/2013 0823   BILITOT 0.3 08/03/2013 0823   GFRNONAA 41* 01/24/2014 1341   GFRAA 47* 01/24/2014 1341    Assessment/plan  Bradycardia With heart rate on lower side, will decrease metoprolol to 50 mg daily only. Not to take metoprolol 100 mg daily. Printed out instruction for this, staff has called Ms [place] (pt's sister) and notified about changes  Essential hypertension, benign Overall normal home readings. Continue amlodipine 10 mg daily, hydralazine 10 mg tid (sister will confirm that pt is taking this med) with clonidine 0.1 mg daily. Continue vasotec 5 mg daily and lasix 20 mg daily. Monitor bmp    DM type 2, controlled, with renal complications Reviewed [id number]. Stop onglyza. continye humulin 70/30 35 u for now  Constipation Will try linzess once a day- sample provided, reassess in 4 weeks  Peripheral autonomic neuropathy due to diabetes mellitus Continue gabapentin 100 mg tid for now. Will get genetic testing to assess on neuropathic pain and chronic pain medication option

## 2014-02-23 ENCOUNTER — Other Ambulatory Visit: Payer: Self-pay | Admitting: Internal Medicine

## 2014-03-13 ENCOUNTER — Encounter: Payer: Self-pay | Admitting: Internal Medicine

## 2014-03-13 ENCOUNTER — Ambulatory Visit (INDEPENDENT_AMBULATORY_CARE_PROVIDER_SITE_OTHER): Payer: Medicare Other | Admitting: Internal Medicine

## 2014-03-13 VITALS — BP 152/84 | HR 56 | Temp 97.7°F | Resp 16 | Wt 225.8 lb

## 2014-03-13 DIAGNOSIS — E1149 Type 2 diabetes mellitus with other diabetic neurological complication: Secondary | ICD-10-CM

## 2014-03-13 DIAGNOSIS — E876 Hypokalemia: Secondary | ICD-10-CM | POA: Diagnosis not present

## 2014-03-13 DIAGNOSIS — I498 Other specified cardiac arrhythmias: Secondary | ICD-10-CM | POA: Diagnosis not present

## 2014-03-13 DIAGNOSIS — I10 Essential (primary) hypertension: Secondary | ICD-10-CM

## 2014-03-13 DIAGNOSIS — K5900 Constipation, unspecified: Secondary | ICD-10-CM

## 2014-03-13 DIAGNOSIS — R001 Bradycardia, unspecified: Secondary | ICD-10-CM

## 2014-03-13 DIAGNOSIS — E1143 Type 2 diabetes mellitus with diabetic autonomic (poly)neuropathy: Secondary | ICD-10-CM

## 2014-03-13 DIAGNOSIS — G8929 Other chronic pain: Secondary | ICD-10-CM | POA: Diagnosis not present

## 2014-03-13 DIAGNOSIS — G909 Disorder of the autonomic nervous system, unspecified: Secondary | ICD-10-CM

## 2014-03-13 MED ORDER — ESOMEPRAZOLE MAGNESIUM 40 MG PO CPDR: DELAYED_RELEASE_CAPSULE | ORAL | Status: DC

## 2014-03-13 MED ORDER — LINACLOTIDE 145 MCG PO CAPS: ORAL_CAPSULE | ORAL | Status: DC

## 2014-03-13 MED ORDER — METOPROLOL SUCCINATE ER 25 MG PO TB24: 25.0000 mg | ORAL_TABLET | Freq: Every day | ORAL | Status: DC

## 2014-03-13 MED ORDER — DICLOFENAC SODIUM 1 % TD GEL: 4.0000 g | Freq: Two times a day (BID) | TRANSDERMAL | Status: DC

## 2014-03-13 MED ORDER — CILOSTAZOL 100 MG PO TABS: 100.0000 mg | ORAL_TABLET | Freq: Every day | ORAL | Status: DC

## 2014-03-13 MED ORDER — HYDRALAZINE HCL 25 MG PO TABS: ORAL_TABLET | ORAL | Status: DC

## 2014-03-13 NOTE — Progress Notes (Signed)
Patient ID: Michelle Buck, female   DOB: 06-Mar-1936, 78 y.o.   MRN: [id number]    Chief Complaint  Patient presents with  . Follow-up    bradycardia, HTN, constipation   Allergies  Allergen Reactions  . Tramadol     Leg cramps   . Codeine Nausea And Vomiting    Patient states N/V with codeine  . Hydrocodone Nausea And Vomiting  . Oxycodone Other (See Comments)    Patient states she can tolerate oxycodone  . Tylenol [Acetaminophen]   . Penicillins Rash    Patient states rash/itch with penicillin    HPI 78 y/o female patient is here for follow up. Her constipation has improved. Her SBP remains on higher side and her heart rate remains low. She is here with her sister today. On review of her home medication with her sister, she is not taking amlodipine and cilostazol. She has also stopped taking her xanax.   Review of Systems   Constitutional: Negative for fever, chills, diaphoresis.   HENT: Negative for congestion, hearing loss and sore throat.    Eyes: Negative for blurred vision, double vision and discharge.   Respiratory: Negative for cough, sputum production, shortness of breath and wheezing.    Cardiovascular: Negative for chest pain, palpitations, orthopnea and leg swelling.   Gastrointestinal: Negative for heartburn, nausea, vomiting, abdominal pain Genitourinary: positive for dysuria and urgency for a week. No hematuria. Negative for frequency and flank pain.   Musculoskeletal: Negative for falls and myalgias. has joint pain and low back pain. Also has leg pain Skin: Negative for itching and rash.   Neurological: Negative for dizziness, tingling, focal weakness and headaches. has neuropathic pain but taking gabapentin 1 to 2 tab daily- advised to take 3 Psychiatric/Behavioral: Negative for memory loss, nervous/anxious   Past Medical History  Diagnosis Date  . Diabetes mellitus   . Hypertension   . Obese   . Hiatal hernia   . Scoliosis   . Arthritis   . Acute  bronchitis   . COPD (chronic obstructive pulmonary disease)   . Unspecified hereditary and idiopathic peripheral neuropathy   . Secondary diabetes mellitus with renal manifestations, not stated as uncontrolled, or unspecified   . Chronic kidney disease, stage II (mild)   . Chronic kidney disease, stage II (mild)   . Chronic kidney disease, unspecified   . Disorder of bone and cartilage, unspecified   . Complications affecting other specified body systems, hypertension   . Hypopotassemia   . Anxiety   . Congestive heart failure, unspecified   . GERD (gastroesophageal reflux disease)   . Lumbago   . Insomnia, unspecified   . Hyperlipidemia   . Unspecified glaucoma   . Diaphragmatic hernia without mention of obstruction or gangrene   . Osteoarthrosis, unspecified whether generalized or localized, unspecified site    Past Surgical History  Procedure Laterality Date  . Abdominal hysterectomy    . Shoulder surgery    . Total hip arthroplasty      bilateral  . Knee arthroscopy      bilateral  . Colonoscopy  08/06/2011   Current Outpatient Prescriptions on File Prior to Visit  Medication Sig Dispense Refill  . ADVAIR DISKUS 250-50 MCG/DOSE AEPB One puff once daily      . aspirin 81 MG tablet Take 81 mg by mouth daily.        . cycloSPORINE (RESTASIS) 0.05 % ophthalmic emulsion 1 drop 2 (two) times daily. One drop once a day bilaterally for  dry eyes      . EASY TOUCH INSULIN SYRINGE 31G X 5/16" 0.5 ML MISC USE UP TO 2 TIMES A DAY AS DIRECTED  100 each  5  . HYDROcodone-acetaminophen (NORCO) 10-325 MG per tablet 1 tablet by mouth every 8 hours as needed for moderate to severe pain      . potassium chloride SA (K-DUR,KLOR-CON) 20 MEQ tablet TAKE 1 TABLET BY MOUTH EVERY DAY WITH FOOD  30 tablet  5  . timolol (TIMOPTIC) 0.5 % ophthalmic solution 1 drop 2 (two) times daily. One drop in bilaterally twice a day as needed      . Travoprost, BAK Free, (TRAVATAN) 0.004 % SOLN ophthalmic solution 1  drop at bedtime. One drop bilaterally twice a day      . VENTOLIN HFA 108 (90 BASE) MCG/ACT inhaler INHALE 1 PUFF 4 TIMES A DAY FOR 5 DAYS, THEN USE EVERY 6 HOURS AS NEEDED  18 each  3   No current facility-administered medications on file prior to visit.    Physical exam BP 152/84  Pulse 56  Temp(Src) 97.7 F (36.5 C) (Oral)  Resp 16  Wt 225 lb 12.8 oz (102.422 kg)  SpO2 95%  General- elderly female in no acute distress, obese Head- atraumatic, normocephalic Eyes- PERRLA, EOMI, no pallor, no icterus, no discharge Neck- no lymphadenopathy, no thyromegaly, no jugular vein distension, no carotid bruit Chest- no chest wall deformities, no chest wall tenderness Cardiovascular- normal s1,s2, no murmurs/ rubs/ gallops Respiratory- bilateral clear to auscultation, no wheeze, no rhonchi, no crackles Abdomen- bowel sounds present, soft, non tender Musculoskeletal- able to move all 4 extremities, limited ROM in left arm with abduction and external rotation, using rollator walker with seat, 1+ edema in both lower legs Neurological- no focal deficit, normal vibration and pin prick sensation, no corns/ callus, no foot deformity, grown toe nails Psychiatry- alert and oriented to person, place and time, normal mood and affect Skin- normal skin, warm and dry   Assessment/plan  1. Essential hypertension, benign With elevated SBP and pt not taking amlodipine, will increase hydralazine to 25 mg bid. reassess  2. Hypokalemia Monitor bmp, continue kcl supplement  3. Bradycardia Hear rate continues to be on lower side. Decrease metoprolol to 25 mg daily and reassess. If HR still remains low, consider EKG to assess for AV block  4. Chronic pain Hydrocodone has been helpful, will review genetic testing result when back and notify patient  5. Peripheral autonomic neuropathy due to diabetes mellitus Advised to take gabapentin as advised, will take gabapentin 100 mg tid  6. Unspecified  constipation Overall mildly elevated BSP at home. With reduction of metoprolol, concern for further rise in BP. Thus will change hydralazine to 25 mg bid and continue clonidine 0.1 mg daily. Continue vasotec 5 mg daily and lasix 20 mg daily. Monitor bmp     7. Constipation linzess has been helpful, continue this

## 2014-03-21 ENCOUNTER — Other Ambulatory Visit: Payer: Self-pay | Admitting: Internal Medicine

## 2014-03-21 ENCOUNTER — Other Ambulatory Visit: Payer: Self-pay | Admitting: *Deleted

## 2014-03-21 ENCOUNTER — Other Ambulatory Visit: Payer: Self-pay | Admitting: Pharmacotherapy

## 2014-03-21 MED ORDER — LINACLOTIDE 145 MCG PO CAPS: ORAL_CAPSULE | ORAL | Status: DC

## 2014-03-21 NOTE — Telephone Encounter (Signed)
Patient daughter, [name], Requested

## 2014-03-23 ENCOUNTER — Ambulatory Visit
Admission: RE | Admit: 2014-03-23 | Discharge: 2014-03-23 | Disposition: A | Discharge status: Home / Self Care | Payer: Medicare Other | Source: Ambulatory Visit | Attending: Orthopedic Surgery | Admitting: Orthopedic Surgery

## 2014-03-23 ENCOUNTER — Other Ambulatory Visit: Payer: Self-pay | Admitting: Orthopedic Surgery

## 2014-03-23 DIAGNOSIS — R079 Chest pain, unspecified: Secondary | ICD-10-CM | POA: Diagnosis not present

## 2014-03-23 DIAGNOSIS — S60219A Contusion of unspecified wrist, initial encounter: Secondary | ICD-10-CM | POA: Diagnosis not present

## 2014-03-23 DIAGNOSIS — T148XXA Other injury of unspecified body region, initial encounter: Secondary | ICD-10-CM

## 2014-03-23 DIAGNOSIS — S298XXA Other specified injuries of thorax, initial encounter: Secondary | ICD-10-CM | POA: Diagnosis not present

## 2014-03-27 ENCOUNTER — Telehealth: Payer: Self-pay | Admitting: *Deleted

## 2014-03-27 NOTE — Telephone Encounter (Signed)
Ok to provide amitiza 8 mcg twice a day - give 30 days supply first to see if this helps.

## 2014-03-27 NOTE — Telephone Encounter (Signed)
Received a request to change pt's Linzess 145 mcg due to insurance coverage.  Called and per [facility], preferred medication coverage is Amitiza. Qty 30 at pharmacy $3.60 & qty 90 at mail order $3.60. Please advise. Thanks

## 2014-03-28 ENCOUNTER — Other Ambulatory Visit: Payer: Self-pay | Admitting: Nurse Practitioner

## 2014-03-28 ENCOUNTER — Other Ambulatory Visit: Payer: Self-pay | Admitting: Internal Medicine

## 2014-03-28 ENCOUNTER — Other Ambulatory Visit: Payer: Self-pay | Admitting: Pharmacotherapy

## 2014-03-28 NOTE — Telephone Encounter (Signed)
lft message to rtn call @ home number

## 2014-03-30 NOTE — Telephone Encounter (Signed)
Pt notified VIA phone and samples placed up front for pt to get on 04/03/14

## 2014-04-03 ENCOUNTER — Encounter: Payer: Self-pay | Admitting: Internal Medicine

## 2014-04-03 ENCOUNTER — Ambulatory Visit (INDEPENDENT_AMBULATORY_CARE_PROVIDER_SITE_OTHER): Payer: Medicare Other | Admitting: Internal Medicine

## 2014-04-03 VITALS — BP 142/80 | HR 60 | Temp 98.1°F | Resp 16 | Wt 228.6 lb

## 2014-04-03 DIAGNOSIS — K5900 Constipation, unspecified: Secondary | ICD-10-CM

## 2014-04-03 DIAGNOSIS — G8929 Other chronic pain: Secondary | ICD-10-CM

## 2014-04-03 DIAGNOSIS — I10 Essential (primary) hypertension: Secondary | ICD-10-CM

## 2014-04-03 DIAGNOSIS — I498 Other specified cardiac arrhythmias: Secondary | ICD-10-CM

## 2014-04-03 DIAGNOSIS — R001 Bradycardia, unspecified: Secondary | ICD-10-CM

## 2014-04-03 DIAGNOSIS — E785 Hyperlipidemia, unspecified: Secondary | ICD-10-CM

## 2014-04-03 MED ORDER — SIMVASTATIN 10 MG PO TABS: ORAL_TABLET | ORAL | Status: DC

## 2014-04-03 MED ORDER — LUBIPROSTONE 8 MCG PO CAPS: 8.0000 ug | ORAL_CAPSULE | Freq: Two times a day (BID) | ORAL | Status: DC

## 2014-04-03 NOTE — Progress Notes (Signed)
Patient ID: Michelle Buck, female   DOB: [birth year], 78 y.o.   MRN: [id number]    Chief Complaint  Patient presents with  . Follow-up    hypertension, bradycardia  . other    leg pain, muscle aches   Allergies  Allergen Reactions  . Tramadol     Leg cramps   . Codeine Nausea And Vomiting    Patient states N/V with codeine  . Hydrocodone Nausea And Vomiting  . Oxycodone Other (See Comments)    Patient states she can tolerate oxycodone  . Tylenol [Acetaminophen]   . Penicillins Rash    Patient states rash/itch with penicillin   HPI 78 y/o female patient is here for follow up. Her constipation has improved with linzess but insurance is not going to cover for this. It will cover her for amitiza. Her blood pressure is better controlled and her heart rate is normal now. Continues to have swelling in her leg more towards end of day. Has been seeing orthopedic for her shoulder pain She complaints of muscle aches and leg pain, worse at night  Review of Systems   Constitutional: Negative for fever, chills, diaphoresis.   HENT: Negative for congestion, hearing loss and sore throat.    Eyes: Negative for blurred vision, double vision and discharge.   Respiratory: Negative for cough, sputum production, shortness of breath and wheezing.    Cardiovascular: Negative for chest pain, palpitations, orthopnea  Gastrointestinal: Negative for heartburn, nausea, vomiting, abdominal pain Genitourinary: positive for dysuria and urgency for a week.  Musculoskeletal: Negative for falls. has joint pain and low back pain. Also has leg pain Skin: Negative for itching and rash.   Neurological: Negative for dizziness, tingling, focal weakness and headaches. has neuropathic pain  Psychiatric/Behavioral: Negative for memory loss, nervous/anxious   Past Medical History  Diagnosis Date  . Diabetes mellitus   . Hypertension   . Obese   . Hiatal hernia   . Scoliosis   . Arthritis   . Acute bronchitis   .  COPD (chronic obstructive pulmonary disease)   . Unspecified hereditary and idiopathic peripheral neuropathy   . Secondary diabetes mellitus with renal manifestations, not stated as uncontrolled, or unspecified   . Chronic kidney disease, stage II (mild)   . Chronic kidney disease, stage II (mild)   . Chronic kidney disease, unspecified   . Disorder of bone and cartilage, unspecified   . Complications affecting other specified body systems, hypertension   . Hypopotassemia   . Anxiety   . Congestive heart failure, unspecified   . GERD (gastroesophageal reflux disease)   . Lumbago   . Insomnia, unspecified   . Hyperlipidemia   . Unspecified glaucoma   . Diaphragmatic hernia without mention of obstruction or gangrene   . Osteoarthrosis, unspecified whether generalized or localized, unspecified site    Medication reviewed. See [facility]  Physical exam BP 142/80  Pulse 60  Temp(Src) 98.1 F (36.7 C) (Oral)  Resp 16  Wt 228 lb 9.6 oz (103.692 kg)  SpO2 93%  General- elderly female in no acute distress, obese Head- atraumatic, normocephalic Eyes- PERRLA, EOMI, no pallor, no icterus, no discharge Neck- no lymphadenopathy, no thyromegaly, no jugular vein distension, no carotid bruit Chest- no chest wall deformities, no chest wall tenderness Cardiovascular- normal s1,s2, no murmurs/ rubs/ gallops Respiratory- bilateral clear to auscultation, no wheeze, no rhonchi, no crackles Abdomen- bowel sounds present, soft, non tender Musculoskeletal- able to move all 4 extremities, limited ROM in left arm with abduction  and external rotation, using rollator walker with seat, 1+ edema in both lower legs Neurological- no focal deficit, normal vibration and pin prick sensation, no corns/ callus, no foot deformity, grown toe nails Psychiatry- alert and oriented to person, place and time, normal mood and affect Skin- normal skin, warm and dry  Lipid Panel     Component Value Date/Time   CHOL 144  05/14/2010 2347   TRIG 156* 03/27/2013 0904   HDL 50 03/27/2013 0904   HDL 48 05/14/2010 2347   CHOLHDL 2.6 03/27/2013 0904   CHOLHDL 3.0 Ratio 05/14/2010 2347   VLDL 40 05/14/2010 2347   LDLCALC 49 03/27/2013 0904   LDLCALC 56 05/14/2010 2347    Assessment/plan  1. Essential hypertension, benign improved reading. Continue hydralazine and clonidine for now  2. Other and unspecified hyperlipidemia With her muscle aches and normal lipid panel in past, will decrease her simvastatin to 10 mg daily. New script provided  3. Bradycardia Stable on low dose metoprolol. Continue same  4. Chronic pain Continue hydrocodone and gabapentin for now  5. Unspecified constipation D/c linzess. Will provide script amitiza 8 mcg bid for now

## 2014-04-10 ENCOUNTER — Telehealth: Payer: Self-pay | Admitting: *Deleted

## 2014-04-10 NOTE — Telephone Encounter (Signed)
Called [employer] # [phone] for prior Authorization on Nexium 40mg  Take one capsule by mouth twice daily for heartburn ID:  [id number]  Approved through 11/29/2014

## 2014-04-13 DIAGNOSIS — M19049 Primary osteoarthritis, unspecified hand: Secondary | ICD-10-CM | POA: Diagnosis not present

## 2014-04-24 ENCOUNTER — Ambulatory Visit
Admission: RE | Admit: 2014-04-24 | Discharge: 2014-04-24 | Disposition: A | Discharge status: Home / Self Care | Payer: Medicare Other | Source: Ambulatory Visit | Attending: Orthopedic Surgery | Admitting: Orthopedic Surgery

## 2014-04-24 ENCOUNTER — Other Ambulatory Visit: Payer: Self-pay | Admitting: Orthopedic Surgery

## 2014-04-24 DIAGNOSIS — T148XXA Other injury of unspecified body region, initial encounter: Secondary | ICD-10-CM

## 2014-04-24 DIAGNOSIS — S4980XA Other specified injuries of shoulder and upper arm, unspecified arm, initial encounter: Secondary | ICD-10-CM | POA: Diagnosis not present

## 2014-04-24 DIAGNOSIS — M79609 Pain in unspecified limb: Secondary | ICD-10-CM | POA: Diagnosis not present

## 2014-04-24 DIAGNOSIS — S46909A Unspecified injury of unspecified muscle, fascia and tendon at shoulder and upper arm level, unspecified arm, initial encounter: Secondary | ICD-10-CM | POA: Diagnosis not present

## 2014-05-02 ENCOUNTER — Encounter: Payer: Self-pay | Admitting: Internal Medicine

## 2014-05-13 ENCOUNTER — Emergency Department (HOSPITAL_COMMUNITY): Payer: Medicare Other

## 2014-05-13 ENCOUNTER — Encounter (HOSPITAL_COMMUNITY): Payer: Self-pay | Admitting: Emergency Medicine

## 2014-05-13 ENCOUNTER — Emergency Department (HOSPITAL_COMMUNITY)
Admission: EM | Admit: 2014-05-13 | Discharge: 2014-05-13 | Disposition: A | Discharge status: Home / Self Care | Payer: Medicare Other | Attending: Emergency Medicine | Admitting: Emergency Medicine

## 2014-05-13 DIAGNOSIS — L02419 Cutaneous abscess of limb, unspecified: Secondary | ICD-10-CM | POA: Insufficient documentation

## 2014-05-13 DIAGNOSIS — M129 Arthropathy, unspecified: Secondary | ICD-10-CM | POA: Insufficient documentation

## 2014-05-13 DIAGNOSIS — E1329 Other specified diabetes mellitus with other diabetic kidney complication: Secondary | ICD-10-CM | POA: Diagnosis not present

## 2014-05-13 DIAGNOSIS — M7989 Other specified soft tissue disorders: Secondary | ICD-10-CM | POA: Insufficient documentation

## 2014-05-13 DIAGNOSIS — K219 Gastro-esophageal reflux disease without esophagitis: Secondary | ICD-10-CM | POA: Insufficient documentation

## 2014-05-13 DIAGNOSIS — Z791 Long term (current) use of non-steroidal anti-inflammatories (NSAID): Secondary | ICD-10-CM | POA: Diagnosis not present

## 2014-05-13 DIAGNOSIS — F411 Generalized anxiety disorder: Secondary | ICD-10-CM | POA: Insufficient documentation

## 2014-05-13 DIAGNOSIS — Z7982 Long term (current) use of aspirin: Secondary | ICD-10-CM | POA: Insufficient documentation

## 2014-05-13 DIAGNOSIS — I509 Heart failure, unspecified: Secondary | ICD-10-CM | POA: Diagnosis not present

## 2014-05-13 DIAGNOSIS — H409 Unspecified glaucoma: Secondary | ICD-10-CM | POA: Insufficient documentation

## 2014-05-13 DIAGNOSIS — E876 Hypokalemia: Secondary | ICD-10-CM | POA: Insufficient documentation

## 2014-05-13 DIAGNOSIS — R52 Pain, unspecified: Secondary | ICD-10-CM | POA: Insufficient documentation

## 2014-05-13 DIAGNOSIS — J441 Chronic obstructive pulmonary disease with (acute) exacerbation: Secondary | ICD-10-CM | POA: Diagnosis not present

## 2014-05-13 DIAGNOSIS — Z792 Long term (current) use of antibiotics: Secondary | ICD-10-CM | POA: Insufficient documentation

## 2014-05-13 DIAGNOSIS — N182 Chronic kidney disease, stage 2 (mild): Secondary | ICD-10-CM | POA: Diagnosis not present

## 2014-05-13 DIAGNOSIS — I129 Hypertensive chronic kidney disease with stage 1 through stage 4 chronic kidney disease, or unspecified chronic kidney disease: Secondary | ICD-10-CM | POA: Insufficient documentation

## 2014-05-13 DIAGNOSIS — E669 Obesity, unspecified: Secondary | ICD-10-CM | POA: Diagnosis not present

## 2014-05-13 DIAGNOSIS — Z79899 Other long term (current) drug therapy: Secondary | ICD-10-CM | POA: Diagnosis not present

## 2014-05-13 DIAGNOSIS — E785 Hyperlipidemia, unspecified: Secondary | ICD-10-CM | POA: Diagnosis not present

## 2014-05-13 DIAGNOSIS — I10 Essential (primary) hypertension: Secondary | ICD-10-CM | POA: Diagnosis not present

## 2014-05-13 DIAGNOSIS — Z880 Allergy status to penicillin: Secondary | ICD-10-CM | POA: Insufficient documentation

## 2014-05-13 DIAGNOSIS — M79609 Pain in unspecified limb: Secondary | ICD-10-CM

## 2014-05-13 DIAGNOSIS — M1990 Unspecified osteoarthritis, unspecified site: Secondary | ICD-10-CM | POA: Insufficient documentation

## 2014-05-13 DIAGNOSIS — R079 Chest pain, unspecified: Secondary | ICD-10-CM | POA: Diagnosis not present

## 2014-05-13 DIAGNOSIS — L03119 Cellulitis of unspecified part of limb: Secondary | ICD-10-CM

## 2014-05-13 DIAGNOSIS — Z87891 Personal history of nicotine dependence: Secondary | ICD-10-CM | POA: Diagnosis not present

## 2014-05-13 DIAGNOSIS — G8929 Other chronic pain: Secondary | ICD-10-CM | POA: Diagnosis not present

## 2014-05-13 DIAGNOSIS — M4120 Other idiopathic scoliosis, site unspecified: Secondary | ICD-10-CM | POA: Insufficient documentation

## 2014-05-13 DIAGNOSIS — R0602 Shortness of breath: Secondary | ICD-10-CM | POA: Diagnosis not present

## 2014-05-13 LAB — I-STAT CHEM 8, ED
BUN: 18 mg/dL (ref 6–23)
CALCIUM ION: 1.1 mmol/L — AB (ref 1.13–1.30)
Chloride: 103 mEq/L (ref 96–112)
Creatinine, Ser: 1.4 mg/dL — ABNORMAL HIGH (ref 0.50–1.10)
Glucose, Bld: 173 mg/dL — ABNORMAL HIGH (ref 70–99)
HCT: 36 % (ref 36.0–46.0)
Hemoglobin: 12.2 g/dL (ref 12.0–15.0)
Potassium: 3.6 mEq/L — ABNORMAL LOW (ref 3.7–5.3)
Sodium: 142 mEq/L (ref 137–147)
TCO2: 26 mmol/L (ref 0–100)

## 2014-05-13 LAB — CBC WITH DIFFERENTIAL/PLATELET
Basophils Absolute: 0 10*3/uL (ref 0.0–0.1)
Basophils Relative: 0 % (ref 0–1)
EOS ABS: 0 10*3/uL (ref 0.0–0.7)
Eosinophils Relative: 0 % (ref 0–5)
HEMATOCRIT: 34.3 % — AB (ref 36.0–46.0)
Hemoglobin: 11.4 g/dL — ABNORMAL LOW (ref 12.0–15.0)
LYMPHS ABS: 0 10*3/uL — AB (ref 0.7–4.0)
Lymphocytes Relative: 0 % — ABNORMAL LOW (ref 12–46)
MCH: 27.4 pg (ref 26.0–34.0)
MCHC: 33.2 g/dL (ref 30.0–36.0)
MCV: 82.5 fL (ref 78.0–100.0)
Monocytes Absolute: 0 10*3/uL — ABNORMAL LOW (ref 0.1–1.0)
Monocytes Relative: 0 % — ABNORMAL LOW (ref 3–12)
NEUTROS ABS: 0 10*3/uL — AB (ref 1.7–7.7)
Neutrophils Relative %: 0 % — ABNORMAL LOW (ref 43–77)
Platelets: 255 10*3/uL (ref 150–400)
RBC: 4.16 MIL/uL (ref 3.87–5.11)
RDW: 15.5 % (ref 11.5–15.5)
WBC: 6.2 10*3/uL (ref 4.0–10.5)

## 2014-05-13 LAB — I-STAT TROPONIN, ED: Troponin i, poc: 0.02 ng/mL (ref 0.00–0.08)

## 2014-05-13 LAB — PRO B NATRIURETIC PEPTIDE: Pro B Natriuretic peptide (BNP): 206.6 pg/mL (ref 0–450)

## 2014-05-13 MED ORDER — DOXYCYCLINE HYCLATE 100 MG PO CAPS: 100.0000 mg | ORAL_CAPSULE | Freq: Two times a day (BID) | ORAL | Status: DC

## 2014-05-13 MED ORDER — HYDROMORPHONE HCL PF 1 MG/ML IJ SOLN
1.0000 mg | Freq: Once | INTRAMUSCULAR | Status: AC
  Administered 2014-05-13: 1 mg via INTRAMUSCULAR
  Filled 2014-05-13: qty 1

## 2014-05-13 NOTE — ED Provider Notes (Signed)
CSN: [id number]     Arrival date & time 05/13/14  1027 History   First MD Initiated Contact with Patient 05/13/14 1037     Chief Complaint  Patient presents with  . Chest Pain     (Consider location/radiation/quality/duration/timing/severity/associated sxs/prior Treatment) Patient is a 78 y.o. female presenting with chest pain. The history is provided by the patient.  Chest Pain Associated symptoms: shortness of breath   Associated symptoms: no abdominal pain, no back pain, no cough, no headache, no nausea, no numbness, not vomiting and no weakness    patient presents with chest pain. His been in her midchest the last few days. Is worse with breathing. Worse with movement. Worse with palpation. She denies cough. It is not worse with exertion. She also states that she has had swelling in her right lower leg. She states she has chronic swelling in her legs but usually not this bad. She states she'll fall a couple weeks ago but that leg was not involved. No history of DVTs or embolism. She states she has chronic pain in her legs due to her bad hips. She states she has some mild shortness of breath.  Past Medical History  Diagnosis Date  . Diabetes mellitus   . Hypertension   . Obese   . Hiatal hernia   . Scoliosis   . Arthritis   . Acute bronchitis   . COPD (chronic obstructive pulmonary disease)   . Unspecified hereditary and idiopathic peripheral neuropathy   . Secondary diabetes mellitus with renal manifestations, not stated as uncontrolled, or unspecified   . Chronic kidney disease, stage II (mild)   . Chronic kidney disease, stage II (mild)   . Chronic kidney disease, unspecified   . Disorder of bone and cartilage, unspecified   . Complications affecting other specified body systems, hypertension   . Hypopotassemia   . Anxiety   . Congestive heart failure, unspecified   . GERD (gastroesophageal reflux disease)   . Lumbago   . Insomnia, unspecified   . Hyperlipidemia   .  Unspecified glaucoma   . Diaphragmatic hernia without mention of obstruction or gangrene   . Osteoarthrosis, unspecified whether generalized or localized, unspecified site    Past Surgical History  Procedure Laterality Date  . Abdominal hysterectomy    . Shoulder surgery    . Total hip arthroplasty      bilateral  . Knee arthroscopy      bilateral  . Colonoscopy  08/06/2011   Family History  Problem Relation Age of Onset  . Diabetes Mother   . Heart disease Mother   . Heart disease Brother   . Diabetes Brother   . Kidney disease Father   . Scoliosis Sister   . Diabetes Brother   . Diabetes Brother   . Heart disease Brother   . Scoliosis Brother   . Stroke Brother   . Heart attack Brother    History  Substance Use Topics  . Smoking status: Former Smoker    Types: Cigarettes    Quit date: 02/27/1997  . Smokeless tobacco: Never Used  . Alcohol Use: No   OB History   Grav Para Term Preterm Abortions TAB SAB Ect Mult Living                 Review of Systems  Constitutional: Negative for activity change and appetite change.  Eyes: Negative for pain.  Respiratory: Positive for shortness of breath. Negative for cough and chest tightness.   Cardiovascular: Positive  for chest pain and leg swelling.  Gastrointestinal: Negative for nausea, vomiting, abdominal pain and diarrhea.  Genitourinary: Negative for flank pain.  Musculoskeletal: Negative for back pain and neck stiffness.  Skin: Positive for color change. Negative for rash.  Neurological: Negative for weakness, numbness and headaches.  Psychiatric/Behavioral: Negative for behavioral problems.      Allergies  Tramadol; Codeine; Hydrocodone; Oxycodone; Tylenol; and Penicillins  Home Medications   Prior to Admission medications   Medication Sig Start Date End Date Taking? Authorizing Provider  ADVAIR DISKUS 250-50 MCG/DOSE AEPB Inhale 1 puff into the lungs daily.  02/11/13  Yes Historical Provider, MD  albuterol  (PROVENTIL HFA;VENTOLIN HFA) 108 (90 BASE) MCG/ACT inhaler Inhale 1 puff into the lungs every 6 (six) hours as needed for wheezing or shortness of breath.   Yes Historical Provider, MD  albuterol (PROVENTIL HFA;VENTOLIN HFA) 108 (90 BASE) MCG/ACT inhaler Inhale 2 puffs into the lungs every 6 (six) hours as needed for wheezing or shortness of breath.   Yes Historical Provider, MD  aspirin 81 MG tablet Take 81 mg by mouth daily.     Yes Historical Provider, MD  cilostazol (PLETAL) 100 MG tablet Take 1 tablet (100 mg total) by mouth daily. Take one tablet once daily 03/13/14  Yes Mahima Pandey, MD  cloNIDine (CATAPRES) 0.1 MG tablet Take 0.1 mg by mouth daily.   Yes Historical Provider, MD  cycloSPORINE (RESTASIS) 0.05 % ophthalmic emulsion 1 drop 2 (two) times daily. One drop once a day bilaterally for dry eyes   Yes Historical Provider, MD  diclofenac sodium (VOLTAREN) 1 % GEL Apply 4 g topically 2 (two) times daily. 03/13/14  Yes Mahima Pandey, MD  enalapril (VASOTEC) 5 MG tablet Take 5 mg by mouth daily.   Yes Historical Provider, MD  esomeprazole (NEXIUM) 40 MG capsule Take 40 mg by mouth 2 (two) times daily before a meal.   Yes Historical Provider, MD  furosemide (LASIX) 20 MG tablet Take 20 mg by mouth daily.   Yes Historical Provider, MD  gabapentin (NEURONTIN) 100 MG capsule Take 100 mg by mouth 3 (three) times daily.   Yes Historical Provider, MD  HUMULIN 70/30 (70-30) 100 UNIT/ML injection INJECT 35 UNITS SUBCUTANEOUSLY WITH BREAKFAST 03/28/14  Yes Tivis Ringer, RPH-CPP  hydrALAZINE (APRESOLINE) 25 MG tablet Take 25 mg by mouth 2 (two) times daily.   Yes Historical Provider, MD  HYDROcodone-acetaminophen (NORCO) 10-325 MG per tablet 1 tablet by mouth every 8 hours as needed for moderate to severe pain 01/03/14  Yes Historical Provider, MD  lubiprostone (AMITIZA) 8 MCG capsule Take 1 capsule (8 mcg total) by mouth 2 (two) times daily with a meal. 04/03/14  Yes Blanchie Serve, MD  metoprolol succinate  (TOPROL-XL) 25 MG 24 hr tablet Take 1 tablet (25 mg total) by mouth daily. Take with or immediately following a meal for Blood pressure 03/13/14  Yes Mahima Pandey, MD  potassium chloride SA (K-DUR,KLOR-CON) 20 MEQ tablet Take 20 mEq by mouth 2 (two) times daily.   Yes Historical Provider, MD  sertraline (ZOLOFT) 50 MG tablet Take 50 mg by mouth daily.   Yes Historical Provider, MD  simvastatin (ZOCOR) 10 MG tablet Take 10 mg by mouth every evening.   Yes Historical Provider, MD  sucralfate (CARAFATE) 1 G tablet Take 1 g by mouth 2 (two) times daily.   Yes Historical Provider, MD  timolol (TIMOPTIC) 0.5 % ophthalmic solution Place 1 drop into both eyes 2 (two) times daily. One drop in  bilaterally twice a day as needed   Yes Historical Provider, MD  Travoprost, BAK Free, (TRAVATAN) 0.004 % SOLN ophthalmic solution Place 1 drop into both eyes at bedtime. One drop bilaterally twice a day   Yes Historical Provider, MD  doxycycline (VIBRAMYCIN) 100 MG capsule Take 1 capsule (100 mg total) by mouth 2 (two) times daily. 05/13/14   Jasper Riling. Senie Lanese, MD   BP 188/62  Pulse 56  Temp(Src) 98.2 F (36.8 C) (Oral)  Resp 18  SpO2 97% Physical Exam  Nursing note and vitals reviewed. Constitutional: She is oriented to person, place, and time. She appears well-developed and well-nourished.  HENT:  Head: Normocephalic and atraumatic.  Eyes: EOM are normal. Pupils are equal, round, and reactive to light.  Neck: Normal range of motion. Neck supple.  Cardiovascular: Normal rate, regular rhythm and normal heart sounds.   No murmur heard. Pulmonary/Chest: Effort normal. No respiratory distress. She has wheezes. She has no rales.  Forced upper airway sounds  Abdominal: Soft. Bowel sounds are normal. She exhibits no distension. There is no tenderness. There is no rebound and no guarding.  Musculoskeletal: Normal range of motion. She exhibits edema.  Edema to bilateral lower extremities. Worse on the right with  some redness of the distal lower leg. Sensation intact over her foot. Pulse intact bilateral feet.  Neurological: She is alert and oriented to person, place, and time. No cranial nerve deficit.  Skin: Skin is warm and dry.  Psychiatric: She has a normal mood and affect. Her speech is normal.    ED Course  Procedures (including critical care time) Labs Review Labs Reviewed  CBC WITH DIFFERENTIAL - Abnormal; Notable for the following:    Hemoglobin 11.4 (*)    HCT 34.3 (*)    Neutrophils Relative % 0 (*)    Lymphocytes Relative 0 (*)    Monocytes Relative 0 (*)    Neutro Abs 0.0 (*)    Lymphs Abs 0.0 (*)    Monocytes Absolute 0.0 (*)    All other components within normal limits  I-STAT CHEM 8, ED - Abnormal; Notable for the following:    Potassium 3.6 (*)    Creatinine, Ser 1.40 (*)    Glucose, Bld 173 (*)    Calcium, Ion 1.10 (*)    All other components within normal limits  PRO B NATRIURETIC PEPTIDE  I-STAT TROPOININ, ED    Imaging Review Dg Chest 2 View  05/13/2014   CLINICAL DATA:  Shortness of breath.  Chest pain.  EXAM: CHEST  2 VIEW  COMPARISON:  03/23/2014  FINDINGS: The heart size appears moderately enlarged. Hiatal hernia noted. Asymmetric elevation of the right hemidiaphragm is again noted. There is no pleural effusion identified. No interstitial edema or airspace consolidation.  IMPRESSION: 1. Cardiac enlargement. 2. No acute findings.   Electronically Signed   By: Kerby Moors M.D.   On: 05/13/2014 12:21     EKG Interpretation   Date/Time:  Sunday May 13 2014 10:33:24 EDT Ventricular Rate:  51 PR Interval:  127 QRS Duration: 86 QT Interval:  397 QTC Calculation: 366 R Axis:   44 Text Interpretation:  Sinus rhythm Atrial premature complex Baseline  wander in lead(s) V1 No significant change was found Confirmed by CAMPOS   MD, KEVIN ([id number]) on 05/13/2014 10:37:59 AM      MDM   Final diagnoses:  Chest pain  Lower extremity cellulitis    Patient with  swelling in right lower extremity and some acute  on chronic chest pain. Patient has chronic pain medications. EKG and labwork is reassuring in terms of coronary disease pulmonary embolism. X-ray reassuring. Dopplers negative for right lower extremity DVT. We'll treat as cellulitis as there is erythema and warmth on that leg compared to the other side. Will discharge home.    Jasper Riling. Alvino Chapel, MD 05/13/14 1504

## 2014-05-13 NOTE — ED Notes (Addendum)
Pt reports she fell two weeks ago in the bathtub.  Had xrays done, but since then right lower leg pain, calf area seems tight to touch. Pt also reports chest pain 8/10 since last week. Reports SOB since Wednesday, but able to speak in full sentences. Denies n/v/d.

## 2014-05-13 NOTE — Progress Notes (Addendum)
VASCULAR LAB PRELIMINARY  PRELIMINARY  PRELIMINARY  PRELIMINARY  Right lower extremity venous duplex completed.    Preliminary report: Right :  No evidence of DVT, superficial thrombosis, or Baker's cyst.  Anwar Sakata, RVS 05/13/2014, 1:28 PM

## 2014-05-13 NOTE — Discharge Instructions (Signed)
Cellulitis Cellulitis is an infection of the skin and the tissue beneath it. The infected area is usually red and tender. Cellulitis occurs most often in the arms and lower legs.  CAUSES  Cellulitis is caused by bacteria that enter the skin through cracks or cuts in the skin. The most common types of bacteria that cause cellulitis are Staphylococcus and Streptococcus. SYMPTOMS   Redness and warmth.  Swelling.  Tenderness or pain.  Fever. DIAGNOSIS  Your caregiver can usually determine what is wrong based on a physical exam. Blood tests may also be done. TREATMENT  Treatment usually involves taking an antibiotic medicine. HOME CARE INSTRUCTIONS   Take your antibiotics as directed. Finish them even if you start to feel better.  Keep the infected arm or leg elevated to reduce swelling.  Apply a warm cloth to the affected area up to 4 times per day to relieve pain.  Only take over-the-counter or prescription medicines for pain, discomfort, or fever as directed by your caregiver.  Keep all follow-up appointments as directed by your caregiver. SEEK MEDICAL CARE IF:   You notice red streaks coming from the infected area.  Your red area gets larger or turns dark in color.  Your bone or joint underneath the infected area becomes painful after the skin has healed.  Your infection returns in the same area or another area.  You notice a swollen bump in the infected area.  You develop new symptoms. SEEK IMMEDIATE MEDICAL CARE IF:   You have a fever.  You feel very sleepy.  You develop vomiting or diarrhea.  You have a general ill feeling (malaise) with muscle aches and pains. MAKE SURE YOU:   Understand these instructions.  Will watch your condition.  Will get help right away if you are not doing well or get worse. Document Released: 08/26/2005 Document Revised: 05/17/2012 Document Reviewed: 02/01/2012 [employer] Patient Information 2014 [employer].  

## 2014-05-14 DIAGNOSIS — H4011X0 Primary open-angle glaucoma, stage unspecified: Secondary | ICD-10-CM | POA: Diagnosis not present

## 2014-05-14 DIAGNOSIS — H04129 Dry eye syndrome of unspecified lacrimal gland: Secondary | ICD-10-CM | POA: Diagnosis not present

## 2014-05-14 DIAGNOSIS — H409 Unspecified glaucoma: Secondary | ICD-10-CM | POA: Diagnosis not present

## 2014-05-14 DIAGNOSIS — E119 Type 2 diabetes mellitus without complications: Secondary | ICD-10-CM | POA: Diagnosis not present

## 2014-05-14 DIAGNOSIS — Z961 Presence of intraocular lens: Secondary | ICD-10-CM | POA: Diagnosis not present

## 2014-05-18 ENCOUNTER — Encounter: Payer: Self-pay | Admitting: Internal Medicine

## 2014-05-24 ENCOUNTER — Other Ambulatory Visit: Payer: Self-pay | Admitting: Internal Medicine

## 2014-05-28 ENCOUNTER — Other Ambulatory Visit: Payer: Self-pay | Admitting: Internal Medicine

## 2014-06-11 ENCOUNTER — Other Ambulatory Visit: Payer: Medicare Other

## 2014-06-11 ENCOUNTER — Encounter: Payer: Self-pay | Admitting: Pharmacotherapy

## 2014-06-11 ENCOUNTER — Ambulatory Visit (INDEPENDENT_AMBULATORY_CARE_PROVIDER_SITE_OTHER): Payer: Medicare Other | Admitting: Pharmacotherapy

## 2014-06-11 VITALS — BP 148/80 | HR 79 | Resp 12 | Wt 208.0 lb

## 2014-06-11 DIAGNOSIS — E1129 Type 2 diabetes mellitus with other diabetic kidney complication: Secondary | ICD-10-CM

## 2014-06-11 DIAGNOSIS — IMO0002 Reserved for concepts with insufficient information to code with codable children: Secondary | ICD-10-CM

## 2014-06-11 DIAGNOSIS — I10 Essential (primary) hypertension: Secondary | ICD-10-CM | POA: Diagnosis not present

## 2014-06-11 DIAGNOSIS — E1165 Type 2 diabetes mellitus with hyperglycemia: Secondary | ICD-10-CM | POA: Diagnosis not present

## 2014-06-11 NOTE — Progress Notes (Signed)
  Subjective:    Michelle Buck is a 78 y.o.African American female who presents for follow-up of Type 2 diabetes mellitus.   Her last A1C was 6.6%.  Had labwork done today. BG logbook:  87-180.  Average ~130-140. No hypoglycemia.  She says her appetite is poor.  She denies skipping meals, But has cut portion sizes.  Food recall reports that breakfast is down to 1 egg, toast, and grits.  Her supper is down to potato salad, spare ribs, veggie. She is having some difficulty with urinary incontinence. She does have nocturia. Does stretching, but no routine exercise.  Ambulates with seated walker. Does have some peripheral edema. She is having pain in feet. Feet and legs are sore. Denies problems with vision.  She is taking Humulin 70/30.  Review of Systems A comprehensive review of systems was negative except for: Cardiovascular: positive for lower extremity edema Genitourinary: positive for nocturia and urinary incontinence Musculoskeletal: positive for arthralgias and stiff joints Neurological: positive for peripheral neuropathy    Objective:    BP 148/80  Pulse 79  Resp 12  Wt 208 lb (94.348 kg)  SpO2 96%  General:  alert, cooperative and morbidly obese  Oropharynx: normal findings: lips normal without lesions and gums healthy   Eyes:  negative findings: lids and lashes normal and conjunctivae and sclerae normal   Ears:  external ears normal        Lung: clear to auscultation bilaterally  Heart:  regular rate and rhythm     Extremities: edema bilateral lower extremities  Skin: dry     Neuro: mental status, speech normal, alert and oriented x3 and sensation reduced at legs   Lab Review Glucose (mg/dL)  Date Value  01/24/2014 94   08/03/2013 138*  03/27/2013 217*     Glucose, Bld (mg/dL)  Date Value  05/13/2014 173*  03/01/2012 212*  02/29/2012 249*     CO2 (mmol/L)  Date Value  01/24/2014 27   08/03/2013 24   03/27/2013 24      BUN (mg/dL)  Date Value  05/13/2014  18   01/24/2014 24   08/03/2013 19   03/27/2013 18   03/01/2012 22   02/29/2012 17      Creatinine, Ser (mg/dL)  Date Value  05/13/2014 1.40*  01/24/2014 1.29*  08/03/2013 1.32*       Assessment:    Diabetes Mellitus type II, under good control. A1C done today. BP above goal of <140/90 today.   Plan:    1.  Rx changes: none 2.  Continue  70/30 insulin - 35 units daily. 3.  Counseled on nutrition goals. 4.  Counseled on benefit of routine exercise.  Goal is 30-45 minutes 5 x week.  Demonstrated seated chair exercises she can do. 5.  Counseled on foot care. 6.  BP above goal <140/90.  Usually at goal.  Continue clonidine, enalapril, hydralazine, and metoprolol.  Will monitor.

## 2014-06-11 NOTE — Patient Instructions (Signed)
Keep up the good work

## 2014-06-12 LAB — COMPREHENSIVE METABOLIC PANEL
ALT: 7 IU/L (ref 0–32)
AST: 10 IU/L (ref 0–40)
Albumin/Globulin Ratio: 1.4 (ref 1.1–2.5)
Albumin: 4 g/dL (ref 3.5–4.8)
Alkaline Phosphatase: 121 IU/L — ABNORMAL HIGH (ref 39–117)
BUN/Creatinine Ratio: 15 (ref 11–26)
BUN: 21 mg/dL (ref 8–27)
CO2: 22 mmol/L (ref 18–29)
Calcium: 9.4 mg/dL (ref 8.7–10.3)
Chloride: 105 mmol/L (ref 97–108)
Creatinine, Ser: 1.41 mg/dL — ABNORMAL HIGH (ref 0.57–1.00)
GFR calc Af Amer: 42 mL/min/{1.73_m2} — ABNORMAL LOW (ref >59)
GFR calc non Af Amer: 36 mL/min/{1.73_m2} — ABNORMAL LOW (ref >59)
Globulin, Total: 2.8 g/dL (ref 1.5–4.5)
Glucose: 191 mg/dL — ABNORMAL HIGH (ref 65–99)
Potassium: 3.6 mmol/L (ref 3.5–5.2)
Sodium: 143 mmol/L (ref 134–144)
Total Bilirubin: 0.3 mg/dL (ref 0.0–1.2)
Total Protein: 6.8 g/dL (ref 6.0–8.5)

## 2014-06-12 LAB — LIPID PANEL
Chol/HDL Ratio: 2.5 ratio units (ref 0.0–4.4)
Cholesterol, Total: 143 mg/dL (ref 100–199)
HDL: 58 mg/dL (ref >39)
LDL Calculated: 56 mg/dL (ref 0–99)
Triglycerides: 145 mg/dL (ref 0–149)
VLDL Cholesterol Cal: 29 mg/dL (ref 5–40)

## 2014-06-12 LAB — HEMOGLOBIN A1C
Est. average glucose Bld gHb Est-mCnc: 169 mg/dL
Hgb A1c MFr Bld: 7.5 % — ABNORMAL HIGH (ref 4.8–5.6)

## 2014-06-15 ENCOUNTER — Other Ambulatory Visit: Payer: Self-pay | Admitting: Internal Medicine

## 2014-06-15 DIAGNOSIS — S40019A Contusion of unspecified shoulder, initial encounter: Secondary | ICD-10-CM | POA: Diagnosis not present

## 2014-06-23 ENCOUNTER — Other Ambulatory Visit: Payer: Self-pay | Admitting: Internal Medicine

## 2014-07-03 ENCOUNTER — Encounter: Payer: Self-pay | Admitting: Internal Medicine

## 2014-07-03 ENCOUNTER — Ambulatory Visit (INDEPENDENT_AMBULATORY_CARE_PROVIDER_SITE_OTHER): Payer: Medicare Other | Admitting: Internal Medicine

## 2014-07-03 ENCOUNTER — Ambulatory Visit: Payer: Medicare Other | Admitting: Internal Medicine

## 2014-07-03 VITALS — BP 138/76 | HR 54 | Temp 97.9°F | Ht 62.0 in | Wt 237.0 lb

## 2014-07-03 DIAGNOSIS — R609 Edema, unspecified: Secondary | ICD-10-CM | POA: Diagnosis not present

## 2014-07-03 DIAGNOSIS — R0600 Dyspnea, unspecified: Secondary | ICD-10-CM

## 2014-07-03 DIAGNOSIS — R0989 Other specified symptoms and signs involving the circulatory and respiratory systems: Secondary | ICD-10-CM | POA: Diagnosis not present

## 2014-07-03 DIAGNOSIS — R635 Abnormal weight gain: Secondary | ICD-10-CM

## 2014-07-03 DIAGNOSIS — R0609 Other forms of dyspnea: Secondary | ICD-10-CM

## 2014-07-03 MED ORDER — FUROSEMIDE 20 MG PO TABS: 40.0000 mg | ORAL_TABLET | Freq: Two times a day (BID) | ORAL | Status: DC

## 2014-07-03 NOTE — Progress Notes (Signed)
Patient ID: Michelle Buck, female   DOB: [birth year], 79 y.o.   MRN: [id number]    Chief Complaint  Patient presents with  . Medical Management of Chronic Issues    3 month follow-up, recent labs discussed with [name]   . Leg Swelling    Patient with increased swelling in feet/legs   . Results    Discuss Genetic Test Results    Allergies  Allergen Reactions  . Tramadol     Leg cramps   . Codeine Nausea And Vomiting    Patient states N/V with codeine  . Hydrocodone Nausea And Vomiting  . Oxycodone Other (See Comments)    Patient states she can tolerate oxycodone  . Tylenol [Acetaminophen]   . Penicillins Rash    Patient states rash/itch with penicillin   HPI 78 y/o female patient is here for follow up. She has worsening of her leg swelling and is getting SOB. Uses a walker to get around and has been following with orthopedic Dr Marily Memos for her OA and back pain. Her constipation has improved. On lasix 20 mg daily and has impaired kidney function  Review of Systems   Constitutional: Negative for fever, chills, diaphoresis.   HENT: Negative for congestion  Eyes: Negative for blurred vision Respiratory: Negative for cough, sputum production Cardiovascular: Negative for chest pain, palpitations. Has orthopnea. Reviewed echocardiogram from 0000000 showing diastolic dysfunction Gastrointestinal: Negative for heartburn, nausea, vomiting, abdominal pain Genitourinary: negative for dysuria Musculoskeletal: Negative for falls. has joint pain and low back pain. Also has leg pain Skin: Negative for itching and rash.   Neurological: Negative for dizziness, tingling, focal weakness and headaches. has neuropathic pain   Psychiatric/Behavioral: Negative for memory loss, nervous/anxious   Wt Readings from Last 3 Encounters:  07/03/14 237 lb (107.502 kg)  06/11/14 208 lb (94.348 kg)  04/03/14 228 lb 9.6 oz (103.692 kg)   Past Medical History  Diagnosis Date  . Diabetes mellitus   .  Hypertension   . Obese   . Hiatal hernia   . Scoliosis   . Arthritis   . Acute bronchitis   . COPD (chronic obstructive pulmonary disease)   . Unspecified hereditary and idiopathic peripheral neuropathy   . Secondary diabetes mellitus with renal manifestations, not stated as uncontrolled, or unspecified   . Chronic kidney disease, stage II (mild)   . Chronic kidney disease, stage II (mild)   . Chronic kidney disease, unspecified   . Disorder of bone and cartilage, unspecified   . Complications affecting other specified body systems, hypertension   . Hypopotassemia   . Anxiety   . Congestive heart failure, unspecified   . GERD (gastroesophageal reflux disease)   . Lumbago   . Insomnia, unspecified   . Hyperlipidemia   . Unspecified glaucoma   . Diaphragmatic hernia without mention of obstruction or gangrene   . Osteoarthrosis, unspecified whether generalized or localized, unspecified site    Current Outpatient Prescriptions on File Prior to Visit  Medication Sig Dispense Refill  . ADVAIR DISKUS 250-50 MCG/DOSE AEPB Inhale 1 puff into the lungs daily.       . AMITIZA 8 MCG capsule TAKE 1 CAPSULE BY MOUTH TWICE DAILY WITH A MEAL  60 capsule  5  . aspirin 81 MG tablet Take 81 mg by mouth daily.        . cilostazol (PLETAL) 100 MG tablet TAKE 1 TABLET BY MOUTH ONCE DAILY  30 tablet  3  . cloNIDine (CATAPRES) 0.1 MG tablet  TAKE 1 TABLET BY MOUTH DAILY  30 tablet  3  . cycloSPORINE (RESTASIS) 0.05 % ophthalmic emulsion 1 drop 2 (two) times daily. One drop once a day bilaterally for dry eyes      . enalapril (VASOTEC) 5 MG tablet Take 5 mg by mouth daily.      Marland Kitchen esomeprazole (NEXIUM) 40 MG capsule Take 40 mg by mouth 2 (two) times daily before a meal.      . gabapentin (NEURONTIN) 100 MG capsule Take 100 mg by mouth 3 (three) times daily.      Marland Kitchen HUMULIN 70/30 (70-30) 100 UNIT/ML injection INJECT 35 UNITS SUBCUTANEOUSLY WITH BREAKFAST  10 mL  3  . hydrALAZINE (APRESOLINE) 25 MG tablet  TAKE 1 TABLET BY MOUTH TWICE DAILY FOR HYPERTENSION  60 tablet  3  . HYDROcodone-acetaminophen (NORCO) 10-325 MG per tablet 1 tablet by mouth every 8 hours as needed for moderate to severe pain      . metoprolol succinate (TOPROL-XL) 25 MG 24 hr tablet TAKE 1 TABLET BY MOUTH ONCE DAILY TAKE WITH OR IMMEDIATELY FOLLOWING A MEAL FOR BLOOD PRESSURE  30 tablet  3  . potassium chloride SA (K-DUR,KLOR-CON) 20 MEQ tablet Take 20 mEq by mouth 2 (two) times daily.      . sertraline (ZOLOFT) 50 MG tablet Take 50 mg by mouth daily.      . simvastatin (ZOCOR) 10 MG tablet Take 10 mg by mouth every evening.      . sucralfate (CARAFATE) 1 G tablet Take 1 g by mouth 2 (two) times daily.      . timolol (TIMOPTIC) 0.5 % ophthalmic solution Place 1 drop into both eyes 2 (two) times daily. One drop in bilaterally twice a day as needed      . Travoprost, BAK Free, (TRAVATAN) 0.004 % SOLN ophthalmic solution Place 1 drop into both eyes at bedtime. One drop bilaterally twice a day      . VENTOLIN HFA 108 (90 BASE) MCG/ACT inhaler INHALE 1 PUFF 4 TIMES A DAY FOR 5 DAYS, THEN USE EVERY 6 HOURS AS NEEDED  18 each  3  . VOLTAREN 1 % GEL APPLY 4 GRAM TOPICALLY TWICE DAILY  100 g  2   No current facility-administered medications on file prior to visit.   Physical exam BP 138/76  Pulse 54  Temp(Src) 97.9 F (36.6 C) (Oral)  Ht 5\' 2"  (1.575 m)  Wt 237 lb (107.502 kg)  BMI 43.34 kg/m2  SpO2 96%  General- elderly female in no acute distress, obese Head- atraumatic, normocephalic Eyes- no pallor, no icterus, no discharge Neck- no lymphadenopathy, no thyromegaly, no jugular vein distension, no carotid bruit Cardiovascular- normal s1,s2, no murmurs Respiratory- bilateral poor air entry but no wheeze, no rhonchi, no crackles Abdomen- bowel sounds present, soft, non tender Musculoskeletal- able to move all 4 extremities, limited ROM in both arm with abduction and external rotation, using rollator walker with seat, 2+  edema in both lower legs upto thigh Neurological- no focal deficit, normal vibration and pin prick sensation, no corns/ callus, no foot deformity, grown toe nails Psychiatry- alert and oriented to person, place and time, normal mood and affect Skin- normal skin, warm and dry   Lab Results  Component Value Date   CREATININE 1.41* 06/11/2014   02/29/12 echocardiogram Study Conclusions  - Procedure narrative: Transthoracic echocardiography. Image   quality was poor. The study was technically difficult, as   a result of poor acoustic windows and poor sound  wave   transmission. - Left ventricle: There was moderate concentric hypertrophy.   Systolic function was normal. The estimated ejection   fraction was in the range of 55% to 60%. Features are   consistent with a pseudonormal left ventricular filling   pattern, with concomitant abnormal relaxation and   increased filling pressure (grade 2 diastolic   dysfunction). Doppler parameters are consistent with high   ventricular filling pressure. - Left atrium: The atrium was mildly dilated. Transthoracic echocardiography.  M-mode, complete 2D, spectral Doppler, and color Doppler.  Height:  Height: 165.1cm. Height: 65in.  Weight:  Weight: 102.5kg. Weight: 225.5lb.  Body mass index:  BMI: 37.6kg/m^2.  Body surface area:    BSA: 2.48m^2.  Blood pressure:     154/68.  Patient status:  Inpatient.  Location:  Bedside.  Assessment/plan  1. Weight gain Has gained several pounds and has dyspnea and increased edema. Hx of diastolic dysfunction. Concern for chf exacerbation. Will increase lasix to 40 mg twice daily and continue her hydralazine, enalapril and toprol xl current regimen. Check bnp. Lung exam not s/o fluid overload at present. Will adjust lasix further if needed. Fluid restriction to 1.5 l /day and weight herself daily- to call if has weight gain of > 3 lbs in 2 days or > 5 lbs in a week - CBC (no diff) - Brain Natriuretic Peptide -  CMP  2. Dyspnea Lasix dose increasement should help, see above, reassess if no improvement. Consider repeat echocardiogram on next visit - Brain Natriuretic Peptide - CMP  3. Edema Likely from chf along with some venous stasis component. See above - Brain Natriuretic Peptide

## 2014-07-04 LAB — CBC
HCT: 34.2 % (ref 34.0–46.6)
HEMOGLOBIN: 11.2 g/dL (ref 11.1–15.9)
MCH: 26.9 pg (ref 26.6–33.0)
MCHC: 32.7 g/dL (ref 31.5–35.7)
MCV: 82 fL (ref 79–97)
Platelets: 263 10*3/uL (ref 150–379)
RBC: 4.17 x10E6/uL (ref 3.77–5.28)
RDW: 16.1 % — ABNORMAL HIGH (ref 12.3–15.4)
WBC: 6.4 10*3/uL (ref 3.4–10.8)

## 2014-07-04 LAB — COMPREHENSIVE METABOLIC PANEL
A/G RATIO: 1.3 (ref 1.1–2.5)
ALT: 5 IU/L (ref 0–32)
AST: 13 IU/L (ref 0–40)
Albumin: 3.8 g/dL (ref 3.5–4.8)
Alkaline Phosphatase: 103 IU/L (ref 39–117)
BUN/Creatinine Ratio: 9 — ABNORMAL LOW (ref 11–26)
BUN: 13 mg/dL (ref 8–27)
CALCIUM: 8.9 mg/dL (ref 8.7–10.3)
CO2: 27 mmol/L (ref 18–29)
Chloride: 101 mmol/L (ref 97–108)
Creatinine, Ser: 1.5 mg/dL — ABNORMAL HIGH (ref 0.57–1.00)
GFR calc Af Amer: 39 mL/min/{1.73_m2} — ABNORMAL LOW (ref >59)
GFR, EST NON AFRICAN AMERICAN: 34 mL/min/{1.73_m2} — AB (ref >59)
GLOBULIN, TOTAL: 2.9 g/dL (ref 1.5–4.5)
Glucose: 138 mg/dL — ABNORMAL HIGH (ref 65–99)
Potassium: 3.9 mmol/L (ref 3.5–5.2)
SODIUM: 143 mmol/L (ref 134–144)
Total Bilirubin: 0.2 mg/dL (ref 0.0–1.2)
Total Protein: 6.7 g/dL (ref 6.0–8.5)

## 2014-07-04 LAB — BRAIN NATRIURETIC PEPTIDE: BNP: 219.9 pg/mL — ABNORMAL HIGH (ref 0.0–100.0)

## 2014-07-09 ENCOUNTER — Other Ambulatory Visit: Payer: Self-pay | Admitting: *Deleted

## 2014-07-09 DIAGNOSIS — R0600 Dyspnea, unspecified: Secondary | ICD-10-CM

## 2014-07-09 DIAGNOSIS — R609 Edema, unspecified: Secondary | ICD-10-CM

## 2014-07-09 DIAGNOSIS — R635 Abnormal weight gain: Secondary | ICD-10-CM

## 2014-07-13 ENCOUNTER — Other Ambulatory Visit: Payer: Medicare Other

## 2014-07-13 ENCOUNTER — Other Ambulatory Visit: Payer: Self-pay | Admitting: Internal Medicine

## 2014-07-13 DIAGNOSIS — R609 Edema, unspecified: Secondary | ICD-10-CM

## 2014-07-13 DIAGNOSIS — R635 Abnormal weight gain: Secondary | ICD-10-CM

## 2014-07-13 DIAGNOSIS — R0600 Dyspnea, unspecified: Secondary | ICD-10-CM

## 2014-07-13 DIAGNOSIS — R0609 Other forms of dyspnea: Secondary | ICD-10-CM | POA: Diagnosis not present

## 2014-07-14 LAB — BASIC METABOLIC PANEL
BUN / CREAT RATIO: 12 (ref 11–26)
BUN: 17 mg/dL (ref 8–27)
CHLORIDE: 96 mmol/L — AB (ref 97–108)
CO2: 30 mmol/L — ABNORMAL HIGH (ref 18–29)
Calcium: 9.5 mg/dL (ref 8.7–10.3)
Creatinine, Ser: 1.41 mg/dL — ABNORMAL HIGH (ref 0.57–1.00)
GFR, EST AFRICAN AMERICAN: 42 mL/min/{1.73_m2} — AB (ref >59)
GFR, EST NON AFRICAN AMERICAN: 36 mL/min/{1.73_m2} — AB (ref >59)
Glucose: 230 mg/dL — ABNORMAL HIGH (ref 65–99)
Potassium: 3.7 mmol/L (ref 3.5–5.2)
Sodium: 141 mmol/L (ref 134–144)

## 2014-07-14 LAB — BRAIN NATRIURETIC PEPTIDE: BNP: 65.9 pg/mL (ref 0.0–100.0)

## 2014-07-17 ENCOUNTER — Other Ambulatory Visit: Payer: Self-pay | Admitting: *Deleted

## 2014-07-17 MED ORDER — FUROSEMIDE 20 MG PO TABS: 40.0000 mg | ORAL_TABLET | Freq: Two times a day (BID) | ORAL | Status: DC

## 2014-07-17 NOTE — Telephone Encounter (Signed)
[facility] 

## 2014-07-18 ENCOUNTER — Encounter: Payer: Self-pay | Admitting: Internal Medicine

## 2014-07-18 ENCOUNTER — Ambulatory Visit (INDEPENDENT_AMBULATORY_CARE_PROVIDER_SITE_OTHER): Payer: Medicare Other | Admitting: Internal Medicine

## 2014-07-18 ENCOUNTER — Telehealth: Payer: Self-pay | Admitting: *Deleted

## 2014-07-18 VITALS — BP 160/88 | HR 71 | Temp 97.7°F | Resp 20 | Ht 62.0 in | Wt 225.8 lb

## 2014-07-18 DIAGNOSIS — Z7409 Other reduced mobility: Secondary | ICD-10-CM

## 2014-07-18 DIAGNOSIS — M8580 Other specified disorders of bone density and structure, unspecified site: Secondary | ICD-10-CM

## 2014-07-18 DIAGNOSIS — M899 Disorder of bone, unspecified: Secondary | ICD-10-CM

## 2014-07-18 DIAGNOSIS — E669 Obesity, unspecified: Secondary | ICD-10-CM | POA: Diagnosis not present

## 2014-07-18 DIAGNOSIS — M19012 Primary osteoarthritis, left shoulder: Principal | ICD-10-CM

## 2014-07-18 DIAGNOSIS — M75101 Unspecified rotator cuff tear or rupture of right shoulder, not specified as traumatic: Secondary | ICD-10-CM

## 2014-07-18 DIAGNOSIS — I10 Essential (primary) hypertension: Secondary | ICD-10-CM | POA: Diagnosis not present

## 2014-07-18 DIAGNOSIS — S43429A Sprain of unspecified rotator cuff capsule, initial encounter: Secondary | ICD-10-CM

## 2014-07-18 DIAGNOSIS — E1149 Type 2 diabetes mellitus with other diabetic neurological complication: Secondary | ICD-10-CM

## 2014-07-18 DIAGNOSIS — M949 Disorder of cartilage, unspecified: Secondary | ICD-10-CM

## 2014-07-18 DIAGNOSIS — M19019 Primary osteoarthritis, unspecified shoulder: Secondary | ICD-10-CM

## 2014-07-18 DIAGNOSIS — E1143 Type 2 diabetes mellitus with diabetic autonomic (poly)neuropathy: Secondary | ICD-10-CM

## 2014-07-18 DIAGNOSIS — R69 Illness, unspecified: Secondary | ICD-10-CM

## 2014-07-18 DIAGNOSIS — M19011 Primary osteoarthritis, right shoulder: Secondary | ICD-10-CM

## 2014-07-18 DIAGNOSIS — G909 Disorder of the autonomic nervous system, unspecified: Secondary | ICD-10-CM

## 2014-07-18 DIAGNOSIS — M48061 Spinal stenosis, lumbar region without neurogenic claudication: Secondary | ICD-10-CM

## 2014-07-18 NOTE — Progress Notes (Signed)
Patient ID: Michelle Buck, female   DOB: 1936/09/03, 77 y.o.   MRN: [id number]    Chief Complaint  Patient presents with  . Acute Visit    mobility examination  . Acute Visit    BS-290(am), Face to Face for wheelchair,   Allergies  Allergen Reactions  . Tramadol     Leg cramps   . Codeine Nausea And Vomiting    Patient states N/V with codeine  . Hydrocodone Nausea And Vomiting  . Oxycodone Other (See Comments)    Patient states she can tolerate oxycodone  . Tylenol [Acetaminophen]   . Penicillins Rash    Patient states rash/itch with penicillin   HPI 78 y/o female patient is here for mobility exam for power mobility device. She has morbid obesity, CHF, HTN, COPD, DM and chronic pain. She also has lumbar spinal stenosis at multiple level, DJD, advanced OA of left shoulder joint and chronic rotator cuff tear on the right. She follows with orthopedics Dr Marily Memos. She is using a rollator walker with seat at present. She weighs 225 pound. She is having problem moving around with her walker due to her shoulder pain limiting her from moving the walker and her leg pain from her spinal stenosis. Her body habitus also limits this. She is having problem getting to the bathroom to toilet and bathe and also to the kitchen to prepare meals and cook. She has home health aid from 8-12 pm at present but is by herself for rest of the day and getting around the house is becoming a limitation for her  She has ran out of her humulin supply and her cbg has been running high. This am her cbg was 290.  Her bp in office is elevated today.  Rush Landmark from [facility] care service is her caregiver and has been checking her blood pressure at home and mentions it is below 140/90 at home. No reading to review in office visit today Her leg swelling has subsided and she has lost some weight since last visit  Review of Systems  Constitutional: Negative for fever, chills, malaise/fatigue and diaphoresis.     Respiratory: Negative for cough, sputum production, wheezing.   Cardiovascular: Negative for chest pain, palpitations. Has leg swelling.  Gastrointestinal: Negative for heartburn, nausea, vomiting, abdominal pain, diarrhea.  Genitourinary: Negative for dysuria, urgency, frequency and flank pain.  Musculoskeletal: Negative for falls. Has chronic back pain. Has neuropathic pain and joint pain Skin: Negative for itching and rash.  Neurological: Negative for dizziness, tingling, headaches.  Psychiatric/Behavioral: Negative for depression  Past Medical History  Diagnosis Date  . Diabetes mellitus   . Hypertension   . Obese   . Hiatal hernia   . Scoliosis   . Arthritis   . Acute bronchitis   . COPD (chronic obstructive pulmonary disease)   . Unspecified hereditary and idiopathic peripheral neuropathy   . Secondary diabetes mellitus with renal manifestations, not stated as uncontrolled, or unspecified   . Chronic kidney disease, stage II (mild)   . Chronic kidney disease, stage II (mild)   . Chronic kidney disease, unspecified   . Disorder of bone and cartilage, unspecified   . Complications affecting other specified body systems, hypertension   . Hypopotassemia   . Anxiety   . Congestive heart failure, unspecified   . GERD (gastroesophageal reflux disease)   . Lumbago   . Insomnia, unspecified   . Hyperlipidemia   . Unspecified glaucoma   . Diaphragmatic hernia without  mention of obstruction or gangrene   . Osteoarthrosis, unspecified whether generalized or localized, unspecified site    Current Outpatient Prescriptions on File Prior to Visit  Medication Sig Dispense Refill  . ADVAIR DISKUS 250-50 MCG/DOSE AEPB Inhale 1 puff into the lungs daily.       . AMITIZA 8 MCG capsule TAKE 1 CAPSULE BY MOUTH TWICE DAILY WITH A MEAL  60 capsule  5  . aspirin 81 MG tablet Take 81 mg by mouth daily.        . cilostazol (PLETAL) 100 MG tablet TAKE 1 TABLET BY MOUTH ONCE DAILY  30 tablet  3   . cloNIDine (CATAPRES) 0.1 MG tablet TAKE 1 TABLET BY MOUTH DAILY  30 tablet  3  . cycloSPORINE (RESTASIS) 0.05 % ophthalmic emulsion 1 drop 2 (two) times daily. One drop once a day bilaterally for dry eyes      . enalapril (VASOTEC) 5 MG tablet Take 5 mg by mouth daily.      Marland Kitchen esomeprazole (NEXIUM) 40 MG capsule Take 40 mg by mouth 2 (two) times daily before a meal.      . furosemide (LASIX) 20 MG tablet Take 2 tablets (40 mg total) by mouth 2 (two) times daily.  120 tablet  3  . gabapentin (NEURONTIN) 100 MG capsule TAKE 1 CAPSULE BY MOUTH THREE TIMES A DAY  90 capsule  1  . HUMULIN 70/30 (70-30) 100 UNIT/ML injection INJECT 35 UNITS SUBCUTANEOUSLY WITH BREAKFAST  10 mL  3  . hydrALAZINE (APRESOLINE) 25 MG tablet TAKE 1 TABLET BY MOUTH TWICE DAILY FOR HYPERTENSION  60 tablet  3  . HYDROcodone-acetaminophen (NORCO) 10-325 MG per tablet 1 tablet by mouth every 8 hours as needed for moderate to severe pain      . metoprolol succinate (TOPROL-XL) 25 MG 24 hr tablet TAKE 1 TABLET BY MOUTH ONCE DAILY TAKE WITH OR IMMEDIATELY FOLLOWING A MEAL FOR BLOOD PRESSURE  30 tablet  3  . potassium chloride SA (K-DUR,KLOR-CON) 20 MEQ tablet Take 20 mEq by mouth 2 (two) times daily.      . sertraline (ZOLOFT) 50 MG tablet Take 50 mg by mouth daily.      . simvastatin (ZOCOR) 10 MG tablet Take 10 mg by mouth every evening.      . sucralfate (CARAFATE) 1 G tablet Take 1 g by mouth 2 (two) times daily.      . timolol (TIMOPTIC) 0.5 % ophthalmic solution Place 1 drop into both eyes 2 (two) times daily. One drop in bilaterally twice a day as needed      . Travoprost, BAK Free, (TRAVATAN) 0.004 % SOLN ophthalmic solution Place 1 drop into both eyes at bedtime. One drop bilaterally twice a day      . VENTOLIN HFA 108 (90 BASE) MCG/ACT inhaler INHALE 1 PUFF 4 TIMES A DAY FOR 5 DAYS, THEN USE EVERY 6 HOURS AS NEEDED  18 each  3  . VOLTAREN 1 % GEL APPLY 4 GRAM TOPICALLY TWICE DAILY  100 g  2   No current  facility-administered medications on file prior to visit.   Physical exam BP 160/88  Pulse 71  Temp(Src) 97.7 F (36.5 C) (Oral)  Resp 20  Ht 5\' 2"  (1.575 m)  Wt 225 lb 12.8 oz (102.422 kg)  BMI 41.29 kg/m2  SpO2 96%  General- elderly female in no acute distress, morbidly obese Head- atraumatic, normocephalic Eyes- PERRLA, EOMI, no pallor, no icterus, no discharge Neck- no lymphadenopathy,  no thyromegaly Cardiovascular- normal s1,s2, no murmurs, has 1+ bilateral leg edema Respiratory- bilateral poor air entry, no wheeze, no rhonchi, no crackles Abdomen- bowel sounds present, soft, non tender Musculoskeletal- has to push herself with her hand to get up from the chair. Both UE abduction limited to 35-40 degrees. External rotation illicits pain. Has crepitus in both her shoulder joint. Strength 3/5 in upper extremity with pain of 7-8/10. Strength 3/5 in lower extremity. Lower extremity external rotation at hip joint area limited with pain of 8/10. She takes small steps and reaches out for support. Has shuffling gait. leg edema 1+ and foot edema 2+ Neurological- normal sensation to fine touch and vibration Skin- warm and dry Psychiatry- alert and oriented to person, place and time, normal mood and affect   Lab Results  Component Value Date   CREATININE 1.41* 07/13/2014    Lab Results  Component Value Date   HGBA1C 7.5* 06/11/2014   Wt Readings from Last 3 Encounters:  07/18/14 225 lb 12.8 oz (102.422 kg)  07/03/14 237 lb (107.502 kg)  06/11/14 208 lb (94.348 kg)   Imaging  04/01/12 mri lumbar spiine MRI LUMBAR SPINE WITHOUT CONTRAST   Technique:  Multiplanar and multiecho pulse sequences of the lumbar spine were obtained without intravenous contrast.   Comparison: 02/15/2012   Findings: Prominent dextroconvex lumbar scoliosis noted.   The lowest full intervertebral disk space is labeled L5-S1.  If procedural intervention is to be performed, careful correlation with this  numbering strategy is recommended.   The conus medullaris appears unremarkable.  Conus level:  L1-2.   Congenitally short pedicles are present in the lumbar spine.   Multilevel degenerative endplate findings are noted, involving all levels between L2 and L5.  Prominent right sided loss of intervertebral disc height noted at L1-2 and L2-3.   Despite efforts by the patient and technologist, motion artifact is present on some series of today's examination and could not be totally eliminated.  This reduces diagnostic sensitivity and specificity.   Lower paraspinal muscular atrophy noted.  There is a considerable rotary component of the scoliosis.    Additional findings at individual levels are as follows:   T11-12:  Prominent right foraminal stenosis and moderate central stenosis is suspected due to congenitally short pedicles, facet arthropathy, and disc material in the right neural foramen.  This level is only partially included on today's images and only seen on the sagittal sequences.   T12-L1:  Moderate central stenosis noted with moderate right and mild left foraminal stenosis secondary to disc bulge, right foraminal disc protrusion, and facet arthropathy.   L1-2:  Moderate left foraminal stenosis, mild central stenosis, and mild left subarticular lateral recess stenosis are secondary to intervertebral and facet spurring.   L2-3:  Marked central stenosis, marked left and mild right foraminal stenosis, and marked left subarticular lateral recess stenosis are secondary to disc osteophyte complex and prominent bilateral facet arthropathy.   L3-4:  Marked central stenosis, moderate to marked bilateral foraminal stenosis, and marked bilateral subarticular lateral recess stenosis is secondary to diffuse disc bulge and right greater than left facet arthropathy.   L4-5:  Marked right foraminal stenosis, moderate central stenosis, and moderate right subarticular lateral recess  stenosis are secondary to the right lateral recess and foraminal disc protrusion along with right greater than left facet arthropathy.   L5-S1:  Marked right and moderate left foraminal stenosis and moderate bilateral subarticular lateral recess stenosis noted secondary to disc bulge, by foraminal disc protrusions, and  bilateral facet arthropathy.   IMPRESSION:   1.  Lumbar spondylosis, scoliosis, and degenerative disc disease causing marked impingement at all levels between L2 and S1, and moderate impingement at T12-L1 and L1-2 as detailed above. 2.  There is also prominent right foraminal stenosis suspected at T11-12. 3.  Congenitally short pedicles in the lumbar spine.   12/23/2006 mri lumbar spine  IMPRESSION:   1. No pronounced change since the study of 12/11/05.   2. Scoliosis convex to the right with the apex at L2-3.   3. Left lateral recess and foraminal narrowing at L1-2 because of osteophytic encroachment.   4. Multifactorial stenosis at L2-3 because of endplate osteophytes and facet and ligamentous hypertrophy. The stenosis is most pronounced in the left lateral recess and intervertebral foramen.    5. L3-4: Moderate multifactorial stenosis. The contributing factors include a broad-based disk protrusion and facet and ligamentous hypertrophy, more pronounced on the right. The stenosis is most pronounced in the right lateral recess and intervertebral foramen on the right.    6. L4-5: Degenerative disk disease and degenerative facet disease more pronounced towards the right with stenosis of the right lateral recess and foraminal to extraforaminal region.    7. L5-S1: Disk degeneration and facet arthropathy with narrowing of the subarticular lateral recesses and neural foramina bilaterally.    07/18/14 rib xray EXAM: RIGHT RIBS AND CHEST - 3+ VIEW   COMPARISON:  10/05/2012   FINDINGS: Prominent heart size without heart failure. Lungs are clear. No infiltrate effusion or  pneumothorax   Negative for right rib fracture.   Moderate to advanced osteoarthritis in the left shoulder joint. Chronic rotator cuff tear on the right.   04/24/14 left humerus xray LEFT HUMERUS - 2+ VIEW   COMPARISON:  03/23/2014   FINDINGS: Four views of the left humerus submitted. No acute fracture or subluxation. Extensive degenerative changes left shoulder joint again noted. Significant spurring of humeral head. There is high riding humeral head suspicious for rotator cuff insufficiency. Inferior spurring of acromion.   IMPRESSION: No acute fracture or subluxation. Extensive degenerative changes left shoulder joint.    Assessment/plan  1. Osteoarthritis of both shoulders, unspecified osteoarthritis type On norco prn for her pain. Her severe OA has limited her ROM in her UE making it difficult for her to use a walker. Her grip strength has decreased and with UE and LE strength of 3/5 she will not be able to use a walker or cane further. Also this is not able to provide adequate support and assitance with her mobility and increases risk for falls. She will not be able to use a manual wheelchair with her rotator cuff tear and severe OA as this will increase risk of further trauma to both her shoulders and worsen her pain. She cannot use a scooter due to lack of postural stability with her scoliosis and spinal stenosis. She will benefit from a motorized wheelchair/ power mobility device to help her mobilize safely and maintain quality of her life. She has the physical and mental ability to operate a power mobility device safely in the home. Patient is willing and motivated to use the device in the home  2. Spinal stenosis of lumbar region See above. Continue prn pain medication  3. Impaired mobility and activities of daily living Her morbid obesity, lumbar spinal stenosis with myelopathy, peripheral neuropathy and DJD with rotator cuff tear has contributed to this. She is using a  walker at present but this is not helping with  her mobility given her weakness of the extremities. She is on pain medication. Orthopedic note not available for review but given her medical conditions, likely not a great surgical candidiate  4. Right rotator cuff tear See above, PMD will be helful to assist in her ADLs further  5. Essential hypertension, benign Will have her bp checked once a day at home and have home health agency fax in home bp reading in a week for further review. No medication changes made for now. Warning signs with elevated bp readings explained  6. Osteopenia Fall precautions. With her walker and postural instability from scoliosis and lumbar spinal stenosis she is at increased risk for fall and with her osteopenia her fracture risk increases  7. Diabetic autonomic neuropathy associated with type 2 diabetes mellitus  Continue gabapentin for pain. Pt to go to her pharmacy from here and pick her humalog and take it as prescribed.   8. Obesity Limits her mobility , has severe DJD, has DM and reflux. Weight loss will help with all the mentioned conditions but her mobility is limited with her spinal stenosis and DJD limiting her ability to lose weight by exercise.

## 2014-07-18 NOTE — Telephone Encounter (Signed)
Dr. Bubba Camp completed the paperwork for patient's [facility] wheelchair. Printed OV notes and faxed to [facility] at Fax # [phone]

## 2014-08-08 NOTE — Telephone Encounter (Signed)
Received Medicare DME Determination for Power Wheelchair and it was approved. Stated that the beneficiary does meet the medical necessity requirements Medicare has established for the Power Wheelchair.

## 2014-08-16 DIAGNOSIS — H4011X0 Primary open-angle glaucoma, stage unspecified: Secondary | ICD-10-CM | POA: Diagnosis not present

## 2014-08-16 DIAGNOSIS — H409 Unspecified glaucoma: Secondary | ICD-10-CM | POA: Diagnosis not present

## 2014-08-17 ENCOUNTER — Other Ambulatory Visit: Payer: Self-pay | Admitting: *Deleted

## 2014-08-17 MED ORDER — INSULIN NPH ISOPHANE & REGULAR (70-30) 100 UNIT/ML ~~LOC~~ SUSP: SUBCUTANEOUS | Status: DC

## 2014-08-17 NOTE — Telephone Encounter (Signed)
[name] requested RF

## 2014-08-24 DIAGNOSIS — M659 Synovitis and tenosynovitis, unspecified: Secondary | ICD-10-CM | POA: Diagnosis not present

## 2014-08-29 ENCOUNTER — Other Ambulatory Visit: Payer: Self-pay | Admitting: Internal Medicine

## 2014-09-20 ENCOUNTER — Other Ambulatory Visit: Payer: Self-pay | Admitting: Internal Medicine

## 2014-09-20 ENCOUNTER — Other Ambulatory Visit: Payer: Self-pay | Admitting: Nurse Practitioner

## 2014-10-03 ENCOUNTER — Telehealth: Payer: Self-pay | Admitting: *Deleted

## 2014-10-03 NOTE — Telephone Encounter (Signed)
Patient sister, [name] called and stated that patients legs and feet are swelling and the four fluid pills does not seem to be working because they are swollen and tight. Now also has a sore on leg developing and she is a diabetic. No available appointment for this week. Please Advise.

## 2014-10-03 NOTE — Telephone Encounter (Signed)
Change lasix to 60 mg in am and pm with xaroxyln 5 mg daily for a week and have her see me next Wednesday to assess the sore. If things worsen. Advise to be seen in urgent care

## 2014-10-04 MED ORDER — FUROSEMIDE 20 MG PO TABS: ORAL_TABLET | ORAL | Status: DC

## 2014-10-04 MED ORDER — METOLAZONE 5 MG PO TABS: 5.0000 mg | ORAL_TABLET | Freq: Every day | ORAL | Status: DC

## 2014-10-04 NOTE — Telephone Encounter (Signed)
Sorry spelt it wrong- zaroxolyn 5 mg daily

## 2014-10-04 NOTE — Telephone Encounter (Signed)
[name] Notified and agreed. I cannot get Xaroxyln 5mg  to come up and I cannot find this medication to fax to pharmacy. Please Advise.

## 2014-10-04 NOTE — Telephone Encounter (Signed)
Medications faxed to pharmacy and [name] Notified and agreed. Appointment scheduled for next Wednesday.

## 2014-10-04 NOTE — Telephone Encounter (Signed)
LMOM to return call.

## 2014-10-08 ENCOUNTER — Ambulatory Visit: Payer: Medicare Other | Admitting: Pharmacotherapy

## 2014-10-08 ENCOUNTER — Other Ambulatory Visit: Payer: Medicare Other

## 2014-10-10 ENCOUNTER — Ambulatory Visit (INDEPENDENT_AMBULATORY_CARE_PROVIDER_SITE_OTHER): Payer: Medicare Other

## 2014-10-10 ENCOUNTER — Ambulatory Visit (INDEPENDENT_AMBULATORY_CARE_PROVIDER_SITE_OTHER): Payer: Medicare Other | Admitting: Internal Medicine

## 2014-10-10 ENCOUNTER — Encounter: Payer: Self-pay | Admitting: Internal Medicine

## 2014-10-10 VITALS — BP 136/84 | HR 51 | Temp 98.0°F | Resp 10 | Wt 228.0 lb

## 2014-10-10 DIAGNOSIS — N183 Chronic kidney disease, stage 3 unspecified: Secondary | ICD-10-CM

## 2014-10-10 DIAGNOSIS — R252 Cramp and spasm: Secondary | ICD-10-CM | POA: Diagnosis not present

## 2014-10-10 DIAGNOSIS — R600 Localized edema: Secondary | ICD-10-CM

## 2014-10-10 DIAGNOSIS — J449 Chronic obstructive pulmonary disease, unspecified: Secondary | ICD-10-CM

## 2014-10-10 DIAGNOSIS — Z23 Encounter for immunization: Secondary | ICD-10-CM

## 2014-10-10 MED ORDER — TORSEMIDE 20 MG PO TABS: ORAL_TABLET | ORAL | Status: DC

## 2014-10-10 MED ORDER — FLUTICASONE-SALMETEROL 250-50 MCG/DOSE IN AEPB: 1.0000 | INHALATION_SPRAY | Freq: Two times a day (BID) | RESPIRATORY_TRACT | Status: DC

## 2014-10-10 NOTE — Patient Instructions (Addendum)
Stop lasix  advair changed to twice a day Stop zaroxylyn

## 2014-10-10 NOTE — Progress Notes (Signed)
Patient ID: Michelle Buck, female   DOB: Oct 01, 1936, 78 y.o.   MRN: [id number]    Chief Complaint  Patient presents with  . Acute Visit    Patient with leg swelling and sore on leg. Patient also complaining of breathing issues   . Immunizations    Discuss is ok to get flu vaccine today   . Equipment Request    Patient would like an evaluation for a shower seat    Allergies  Allergen Reactions  . Tramadol     Leg cramps   . Codeine Nausea And Vomiting    Patient states N/V with codeine  . Hydrocodone Nausea And Vomiting  . Oxycodone Other (See Comments)    Patient states she can tolerate oxycodone  . Tylenol [Acetaminophen]   . Penicillins Rash    Patient states rash/itch with penicillin   HPI 78 y/o female patient is here for acute concern. Her swelling had worsened last week and over the phone her lasix was increased to 60 mg bid and zaroxolyn 5 mg daily was introduced. She mentions the swelling has gone down some. She had also noticed a sore at junction of her right ankle and leg and would like this checked. She mentions having some leg cramps recently. She would like a shower bench for her   Wt Readings from Last 3 Encounters:  10/10/14 228 lb (103.42 kg)  07/18/14 225 lb 12.8 oz (102.422 kg)  07/03/14 237 lb (107.502 kg)   ROS On review has gained 3 lb since last visit Careful with her diet Denies new dyspnea but has wheezing Denies chest pain or palpitations Denies headache, dizziness, new focal weakness  Past Medical History  Diagnosis Date  . Diabetes mellitus   . Hypertension   . Obese   . Hiatal hernia   . Scoliosis   . Arthritis   . Acute bronchitis   . COPD (chronic obstructive pulmonary disease)   . Unspecified hereditary and idiopathic peripheral neuropathy   . Secondary diabetes mellitus with renal manifestations, not stated as uncontrolled, or unspecified   . Chronic kidney disease, stage II (mild)   . Chronic kidney disease, stage II (mild)   .  Chronic kidney disease, unspecified   . Disorder of bone and cartilage, unspecified   . Complications affecting other specified body systems, hypertension   . Hypopotassemia   . Anxiety   . Congestive heart failure, unspecified   . GERD (gastroesophageal reflux disease)   . Lumbago   . Insomnia, unspecified   . Hyperlipidemia   . Unspecified glaucoma   . Diaphragmatic hernia without mention of obstruction or gangrene   . Osteoarthrosis, unspecified whether generalized or localized, unspecified site    Current Outpatient Prescriptions on File Prior to Visit  Medication Sig Dispense Refill  . AMITIZA 8 MCG capsule TAKE 1 CAPSULE BY MOUTH TWICE DAILY WITH A MEAL 60 capsule 5  . aspirin 81 MG tablet Take 81 mg by mouth daily.      . cilostazol (PLETAL) 100 MG tablet TAKE 1 TABLET BY MOUTH EVERY DAY 30 tablet 4  . cloNIDine (CATAPRES) 0.1 MG tablet TAKE 1 TABLET BY MOUTH EVERY DAY 30 tablet 4  . cycloSPORINE (RESTASIS) 0.05 % ophthalmic emulsion 1 drop 2 (two) times daily. One drop once a day bilaterally for dry eyes    . EASY TOUCH INSULIN SYRINGE 31G X 5/16" 0.5 ML MISC USE UP TO 2 TIMES A DAY AS DIRECTED 10 each 4  . enalapril (VASOTEC)  5 MG tablet Take 5 mg by mouth daily.    Marland Kitchen esomeprazole (NEXIUM) 40 MG capsule Take 40 mg by mouth 2 (two) times daily before a meal.    . etodolac (LODINE) 400 MG tablet Take 400 mg by mouth 2 (two) times daily.    Marland Kitchen gabapentin (NEURONTIN) 100 MG capsule TAKE 1 CAPSULE BY MOUTH THREE TIMES A DAY 90 capsule 4  . hydrALAZINE (APRESOLINE) 25 MG tablet TAKE 1 TABLET BY MOUTH TWICE DAILY FOR HYPERTENSION 60 tablet 4  . HYDROcodone-acetaminophen (NORCO) 10-325 MG per tablet 1 tablet by mouth every 8 hours as needed for moderate to severe pain    . insulin NPH-regular Human (HUMULIN 70/30) (70-30) 100 UNIT/ML injection Inject 35 units subcutaneously with breakfast to control blood sugar. 20 mL 5  . metoprolol succinate (TOPROL-XL) 25 MG 24 hr tablet TAKE 1 TABLET  BY MOUTH EVERY DAY WITH OR IMMEDIATELY FOLLOWING A MEAL FOR BLOOD PRESSURE 30 tablet 4  . NEXIUM 40 MG capsule TAKE 1 CAPSULE BY MOUTH TWICE DAILY FOR HEARTBURN 60 capsule 4  . potassium chloride SA (K-DUR,KLOR-CON) 20 MEQ tablet TAKE 1 TABLET BY MOUTH EVERY DAY WITH FOOD 30 tablet 4  . sertraline (ZOLOFT) 50 MG tablet TAKE 1 TABLET BY MOUTH DAILY 30 tablet 4  . simvastatin (ZOCOR) 10 MG tablet Take 10 mg by mouth every evening.    . sucralfate (CARAFATE) 1 G tablet Take 1 g by mouth 2 (two) times daily.    . timolol (TIMOPTIC) 0.5 % ophthalmic solution Place 1 drop into both eyes 2 (two) times daily. One drop in bilaterally twice a day as needed    . Travoprost, BAK Free, (TRAVATAN) 0.004 % SOLN ophthalmic solution Place 1 drop into both eyes at bedtime. One drop bilaterally twice a day    . VENTOLIN HFA 108 (90 BASE) MCG/ACT inhaler INHALE 1 PUFF 4 TIMES A DAY FOR 5 DAYS, THEN USE EVERY 6 HOURS AS NEEDED 18 each 3  . VOLTAREN 1 % GEL APPLY 4 GRAM TOPICALLY TWICE DAILY 100 g 4   No current facility-administered medications on file prior to visit.    Physical exam BP 136/84 mmHg  Pulse 51  Temp(Src) 98 F (36.7 C) (Oral)  Resp 10  Wt 228 lb (103.42 kg)  SpO2 91%  General- elderly female in no acute distress, morbidly obese Head- atraumatic, normocephalic Eyes- PERRLA, EOMI, no pallor, no icterus, no discharge Neck- no lymphadenopathy, no thyromegaly Cardiovascular- normal s1,s2, no murmurs, has 1+ bilateral leg edema, mentions improved from what it was for her last week Respiratory- bilateral poor air entry, wheeze +, no rhonchi, no crackles Abdomen- bowel sounds present, soft, non tender Musculoskeletal- has to push herself with her hand to get up from the chair. Both UE abduction limited to 35-40 degrees. External rotation illicits pain. Has crepitus in both her shoulder joint. Strength 3/5 in upper extremity with pain of 7-8/10. Strength 3/5 in lower extremity. Lower extremity  external rotation at hip joint area limited with pain of 8/10. She takes small steps and reaches out for support. Has shuffling gait. leg edema 1+ and foot edema 2+ Neurological- normal sensation to fine touch and vibration Skin- warm and dry. No skin tear or sore noted in feet or ankle Psychiatry- alert and oriented to person, place and time, normal mood and affect    Lab Results  Component Value Date   CREATININE 1.41* 07/13/2014   CMP     Component Value Date/Time  NA 141 07/13/2014 0808   NA 142 05/13/2014 1122   K 3.7 07/13/2014 0808   CL 96* 07/13/2014 0808   CO2 30* 07/13/2014 0808   GLUCOSE 230* 07/13/2014 0808   GLUCOSE 173* 05/13/2014 1122   BUN 17 07/13/2014 0808   BUN 18 05/13/2014 1122   CREATININE 1.41* 07/13/2014 0808   CALCIUM 9.5 07/13/2014 0808   CALCIUM 8.6 05/14/2010 2347   PROT 6.7 07/03/2014 1056   PROT 7.4 02/29/2012 0328   ALBUMIN 3.4* 02/29/2012 0328   AST 13 07/03/2014 1056   ALT 5 07/03/2014 1056   ALKPHOS 103 07/03/2014 1056   BILITOT 0.2 07/03/2014 1056   GFRNONAA 36* 07/13/2014 0808   GFRAA 42* 07/13/2014 0808    Assessment/plan  Leg edema Change lasix to torsemide 40 mg in am and 20 mg in pm with impaired renal function. D/c zaroxolyn for now.monitor clinically  ckd stage 3 With recent increase in her lasix and impaired kidney function will d/c her lasix for now  Leg cramps Likely form hypokalemia given increased dosing of lasix. Check bmp. Is on kcl supplement, if k level low will need to increase kcl dosing  Copd Wheezing on exam, change advair to bid and continue prn ventolin for now. Consider adding spiriva on next visit if wheezing persists  Influenza vaccine provided Shower bench script provided

## 2014-10-11 LAB — COMPREHENSIVE METABOLIC PANEL
A/G RATIO: 1.3 (ref 1.1–2.5)
ALBUMIN: 3.9 g/dL (ref 3.5–4.8)
ALK PHOS: 102 IU/L (ref 39–117)
ALT: 6 IU/L (ref 0–32)
AST: 13 IU/L (ref 0–40)
BUN/Creatinine Ratio: 14 (ref 11–26)
BUN: 23 mg/dL (ref 8–27)
CO2: 28 mmol/L (ref 18–29)
CREATININE: 1.7 mg/dL — AB (ref 0.57–1.00)
Calcium: 9.3 mg/dL (ref 8.7–10.3)
Chloride: 96 mmol/L — ABNORMAL LOW (ref 97–108)
GFR calc Af Amer: 33 mL/min/{1.73_m2} — ABNORMAL LOW (ref >59)
GFR calc non Af Amer: 29 mL/min/{1.73_m2} — ABNORMAL LOW (ref >59)
GLOBULIN, TOTAL: 2.9 g/dL (ref 1.5–4.5)
Glucose: 211 mg/dL — ABNORMAL HIGH (ref 65–99)
Potassium: 3.3 mmol/L — ABNORMAL LOW (ref 3.5–5.2)
Sodium: 141 mmol/L (ref 134–144)
Total Bilirubin: <0.2 mg/dL (ref 0.0–1.2)
Total Protein: 6.8 g/dL (ref 6.0–8.5)

## 2014-10-12 ENCOUNTER — Ambulatory Visit (INDEPENDENT_AMBULATORY_CARE_PROVIDER_SITE_OTHER): Payer: Medicare Other | Admitting: Podiatry

## 2014-10-12 ENCOUNTER — Encounter: Payer: Self-pay | Admitting: Podiatry

## 2014-10-12 VITALS — BP 135/76 | HR 69 | Resp 16 | Ht 64.0 in | Wt 230.0 lb

## 2014-10-12 DIAGNOSIS — M79676 Pain in unspecified toe(s): Secondary | ICD-10-CM

## 2014-10-12 DIAGNOSIS — E1140 Type 2 diabetes mellitus with diabetic neuropathy, unspecified: Secondary | ICD-10-CM | POA: Diagnosis not present

## 2014-10-12 DIAGNOSIS — E1149 Type 2 diabetes mellitus with other diabetic neurological complication: Secondary | ICD-10-CM

## 2014-10-12 DIAGNOSIS — B351 Tinea unguium: Secondary | ICD-10-CM | POA: Diagnosis not present

## 2014-10-12 NOTE — Patient Instructions (Signed)
Diabetes and Foot Care Diabetes may cause you to have problems because of poor blood supply (circulation) to your feet and legs. This may cause the skin on your feet to become thinner, break easier, and heal more slowly. Your skin may become dry, and the skin may peel and crack. You may also have nerve damage in your legs and feet causing decreased feeling in them. You may not notice minor injuries to your feet that could lead to infections or more serious problems. Taking care of your feet is one of the most important things you can do for yourself.  HOME CARE INSTRUCTIONS  Wear shoes at all times, even in the house. Do not go barefoot. Bare feet are easily injured.  Check your feet daily for blisters, cuts, and redness. If you cannot see the bottom of your feet, use a mirror or ask someone for help.  Wash your feet with warm water (do not use hot water) and mild soap. Then pat your feet and the areas between your toes until they are completely dry. Do not soak your feet as this can dry your skin.  Apply a moisturizing lotion or petroleum jelly (that does not contain alcohol and is unscented) to the skin on your feet and to dry, brittle toenails. Do not apply lotion between your toes.  Trim your toenails straight across. Do not dig under them or around the cuticle. File the edges of your nails with an emery board or nail file.  Do not cut corns or calluses or try to remove them with medicine.  Wear clean socks or stockings every day. Make sure they are not too tight. Do not wear knee-high stockings since they may decrease blood flow to your legs.  Wear shoes that fit properly and have enough cushioning. To break in new shoes, wear them for just a few hours a day. This prevents you from injuring your feet. Always look in your shoes before you put them on to be sure there are no objects inside.  Do not cross your legs. This may decrease the blood flow to your feet.  If you find a minor scrape,  cut, or break in the skin on your feet, keep it and the skin around it clean and dry. These areas may be cleansed with mild soap and water. Do not cleanse the area with peroxide, alcohol, or iodine.  When you remove an adhesive bandage, be sure not to damage the skin around it.  If you have a wound, look at it several times a day to make sure it is healing.  Do not use heating pads or hot water bottles. They may burn your skin. If you have lost feeling in your feet or legs, you may not know it is happening until it is too late.  Make sure your health care provider performs a complete foot exam at least annually or more often if you have foot problems. Report any cuts, sores, or bruises to your health care provider immediately. SEEK MEDICAL CARE IF:   You have an injury that is not healing.  You have cuts or breaks in the skin.  You have an ingrown nail.  You notice redness on your legs or feet.  You feel burning or tingling in your legs or feet.  You have pain or cramps in your legs and feet.  Your legs or feet are numb.  Your feet always feel cold. SEEK IMMEDIATE MEDICAL CARE IF:   There is increasing redness,   swelling, or pain in or around a wound.  There is a red line that goes up your leg.  Pus is coming from a wound.  You develop a fever or as directed by your health care provider.  You notice a bad smell coming from an ulcer or wound. Document Released: 11/13/2000 Document Revised: 07/19/2013 Document Reviewed: 04/25/2013 Exit[employer] Patient Information 2015 [employer]. This information is not intended to replace advice given to you by your health care provider. Make sure you discuss any questions you have with your health care provider.  

## 2014-10-15 ENCOUNTER — Emergency Department (HOSPITAL_COMMUNITY): Payer: Medicare Other

## 2014-10-15 ENCOUNTER — Emergency Department (HOSPITAL_COMMUNITY)
Admission: EM | Admit: 2014-10-15 | Discharge: 2014-10-15 | Disposition: A | Discharge status: Home / Self Care | Payer: Medicare Other | Attending: Emergency Medicine | Admitting: Emergency Medicine

## 2014-10-15 ENCOUNTER — Encounter (HOSPITAL_COMMUNITY): Payer: Self-pay | Admitting: Emergency Medicine

## 2014-10-15 DIAGNOSIS — Z87891 Personal history of nicotine dependence: Secondary | ICD-10-CM | POA: Diagnosis not present

## 2014-10-15 DIAGNOSIS — E785 Hyperlipidemia, unspecified: Secondary | ICD-10-CM | POA: Diagnosis not present

## 2014-10-15 DIAGNOSIS — Z794 Long term (current) use of insulin: Secondary | ICD-10-CM | POA: Diagnosis not present

## 2014-10-15 DIAGNOSIS — I517 Cardiomegaly: Secondary | ICD-10-CM | POA: Diagnosis not present

## 2014-10-15 DIAGNOSIS — K219 Gastro-esophageal reflux disease without esophagitis: Secondary | ICD-10-CM | POA: Insufficient documentation

## 2014-10-15 DIAGNOSIS — M419 Scoliosis, unspecified: Secondary | ICD-10-CM | POA: Diagnosis not present

## 2014-10-15 DIAGNOSIS — M1990 Unspecified osteoarthritis, unspecified site: Secondary | ICD-10-CM | POA: Diagnosis not present

## 2014-10-15 DIAGNOSIS — R05 Cough: Secondary | ICD-10-CM | POA: Diagnosis not present

## 2014-10-15 DIAGNOSIS — R059 Cough, unspecified: Secondary | ICD-10-CM

## 2014-10-15 DIAGNOSIS — J40 Bronchitis, not specified as acute or chronic: Secondary | ICD-10-CM | POA: Diagnosis not present

## 2014-10-15 DIAGNOSIS — F419 Anxiety disorder, unspecified: Secondary | ICD-10-CM | POA: Insufficient documentation

## 2014-10-15 DIAGNOSIS — J441 Chronic obstructive pulmonary disease with (acute) exacerbation: Secondary | ICD-10-CM | POA: Insufficient documentation

## 2014-10-15 DIAGNOSIS — E669 Obesity, unspecified: Secondary | ICD-10-CM | POA: Insufficient documentation

## 2014-10-15 DIAGNOSIS — I5020 Unspecified systolic (congestive) heart failure: Secondary | ICD-10-CM | POA: Diagnosis not present

## 2014-10-15 DIAGNOSIS — Z7982 Long term (current) use of aspirin: Secondary | ICD-10-CM | POA: Diagnosis not present

## 2014-10-15 DIAGNOSIS — H409 Unspecified glaucoma: Secondary | ICD-10-CM | POA: Insufficient documentation

## 2014-10-15 DIAGNOSIS — N182 Chronic kidney disease, stage 2 (mild): Secondary | ICD-10-CM | POA: Diagnosis not present

## 2014-10-15 DIAGNOSIS — G4700 Insomnia, unspecified: Secondary | ICD-10-CM | POA: Insufficient documentation

## 2014-10-15 DIAGNOSIS — Z79899 Other long term (current) drug therapy: Secondary | ICD-10-CM | POA: Insufficient documentation

## 2014-10-15 DIAGNOSIS — G609 Hereditary and idiopathic neuropathy, unspecified: Secondary | ICD-10-CM | POA: Insufficient documentation

## 2014-10-15 DIAGNOSIS — I129 Hypertensive chronic kidney disease with stage 1 through stage 4 chronic kidney disease, or unspecified chronic kidney disease: Secondary | ICD-10-CM | POA: Insufficient documentation

## 2014-10-15 DIAGNOSIS — Z7951 Long term (current) use of inhaled steroids: Secondary | ICD-10-CM | POA: Diagnosis not present

## 2014-10-15 DIAGNOSIS — E1321 Other specified diabetes mellitus with diabetic nephropathy: Secondary | ICD-10-CM | POA: Insufficient documentation

## 2014-10-15 DIAGNOSIS — Z880 Allergy status to penicillin: Secondary | ICD-10-CM | POA: Insufficient documentation

## 2014-10-15 DIAGNOSIS — J811 Chronic pulmonary edema: Secondary | ICD-10-CM | POA: Diagnosis not present

## 2014-10-15 LAB — CBC WITH DIFFERENTIAL/PLATELET
Basophils Absolute: 0 10*3/uL (ref 0.0–0.1)
Basophils Relative: 0 % (ref 0–1)
Eosinophils Absolute: 0.3 10*3/uL (ref 0.0–0.7)
Eosinophils Relative: 3 % (ref 0–5)
HCT: 37 % (ref 36.0–46.0)
Hemoglobin: 12.1 g/dL (ref 12.0–15.0)
Lymphocytes Relative: 18 % (ref 12–46)
Lymphs Abs: 1.6 10*3/uL (ref 0.7–4.0)
MCH: 26.7 pg (ref 26.0–34.0)
MCHC: 32.7 g/dL (ref 30.0–36.0)
MCV: 81.5 fL (ref 78.0–100.0)
Monocytes Absolute: 0.2 10*3/uL (ref 0.1–1.0)
Monocytes Relative: 3 % (ref 3–12)
Neutro Abs: 6.4 10*3/uL (ref 1.7–7.7)
Neutrophils Relative %: 76 % (ref 43–77)
Platelets: 230 10*3/uL (ref 150–400)
RBC: 4.54 MIL/uL (ref 3.87–5.11)
RDW: 15.2 % (ref 11.5–15.5)
WBC: 8.4 10*3/uL (ref 4.0–10.5)

## 2014-10-15 LAB — COMPREHENSIVE METABOLIC PANEL
ALK PHOS: 101 U/L (ref 39–117)
ALT: 7 U/L (ref 0–35)
AST: 14 U/L (ref 0–37)
Albumin: 3.5 g/dL (ref 3.5–5.2)
Anion gap: 16 — ABNORMAL HIGH (ref 5–15)
BUN: 49 mg/dL — ABNORMAL HIGH (ref 6–23)
CO2: 28 mEq/L (ref 19–32)
Calcium: 9.5 mg/dL (ref 8.4–10.5)
Chloride: 92 mEq/L — ABNORMAL LOW (ref 96–112)
Creatinine, Ser: 1.78 mg/dL — ABNORMAL HIGH (ref 0.50–1.10)
GFR calc non Af Amer: 27 mL/min — ABNORMAL LOW (ref >90)
GFR, EST AFRICAN AMERICAN: 31 mL/min — AB (ref >90)
Glucose, Bld: 354 mg/dL — ABNORMAL HIGH (ref 70–99)
POTASSIUM: 3.7 meq/L (ref 3.7–5.3)
SODIUM: 136 meq/L — AB (ref 137–147)
TOTAL PROTEIN: 8 g/dL (ref 6.0–8.3)
Total Bilirubin: 0.3 mg/dL (ref 0.3–1.2)

## 2014-10-15 LAB — PRO B NATRIURETIC PEPTIDE: Pro B Natriuretic peptide (BNP): 110.2 pg/mL (ref 0–450)

## 2014-10-15 MED ORDER — PREDNISONE 20 MG PO TABS
60.0000 mg | ORAL_TABLET | Freq: Once | ORAL | Status: AC
  Administered 2014-10-15: 60 mg via ORAL
  Filled 2014-10-15: qty 3

## 2014-10-15 MED ORDER — ALBUTEROL SULFATE (2.5 MG/3ML) 0.083% IN NEBU
2.5000 mg | INHALATION_SOLUTION | Freq: Once | RESPIRATORY_TRACT | Status: AC
  Administered 2014-10-15: 2.5 mg via RESPIRATORY_TRACT
  Filled 2014-10-15: qty 3

## 2014-10-15 MED ORDER — GUAIFENESIN 100 MG/5ML PO SYRP: 100.0000 mg | ORAL_SOLUTION | ORAL | Status: DC | PRN

## 2014-10-15 MED ORDER — ALBUTEROL SULFATE HFA 108 (90 BASE) MCG/ACT IN AERS
2.0000 | INHALATION_SPRAY | RESPIRATORY_TRACT | Status: DC | PRN
  Administered 2014-10-15: 2 via RESPIRATORY_TRACT
  Filled 2014-10-15: qty 6.7

## 2014-10-15 MED ORDER — AZITHROMYCIN 250 MG PO TABS: ORAL_TABLET | ORAL | Status: DC

## 2014-10-15 MED ORDER — IPRATROPIUM-ALBUTEROL 0.5-2.5 (3) MG/3ML IN SOLN
3.0000 mL | Freq: Once | RESPIRATORY_TRACT | Status: AC
  Administered 2014-10-15: 3 mL via RESPIRATORY_TRACT
  Filled 2014-10-15: qty 3

## 2014-10-15 MED ORDER — FUROSEMIDE 40 MG PO TABS
40.0000 mg | ORAL_TABLET | Freq: Once | ORAL | Status: AC
  Administered 2014-10-15: 40 mg via ORAL
  Filled 2014-10-15: qty 1

## 2014-10-15 NOTE — ED Provider Notes (Addendum)
CSN: [id number]     Arrival date & time 10/15/14  1059 History   First MD Initiated Contact with Patient 10/15/14 1118     Chief Complaint  Patient presents with  . Shortness of Breath  . Cough     (Consider location/radiation/quality/duration/timing/severity/associated sxs/prior Treatment) Patient is a 78 y.o. female presenting with shortness of breath and cough. The history is provided by the patient (pt  complains of cough).  Shortness of Breath Severity:  Moderate Onset quality:  Gradual Timing:  Constant Progression:  Unable to specify Chronicity:  New Context: activity   Associated symptoms: cough   Associated symptoms: no abdominal pain, no chest pain, no headaches and no rash   Cough Associated symptoms: shortness of breath   Associated symptoms: no chest pain, no eye discharge, no headaches and no rash     Past Medical History  Diagnosis Date  . Diabetes mellitus   . Hypertension   . Obese   . Hiatal hernia   . Scoliosis   . Arthritis   . Acute bronchitis   . COPD (chronic obstructive pulmonary disease)   . Unspecified hereditary and idiopathic peripheral neuropathy   . Secondary diabetes mellitus with renal manifestations, not stated as uncontrolled, or unspecified   . Chronic kidney disease, stage II (mild)   . Chronic kidney disease, stage II (mild)   . Chronic kidney disease, unspecified   . Disorder of bone and cartilage, unspecified   . Complications affecting other specified body systems, hypertension   . Hypopotassemia   . Anxiety   . Congestive heart failure, unspecified   . GERD (gastroesophageal reflux disease)   . Lumbago   . Insomnia, unspecified   . Hyperlipidemia   . Unspecified glaucoma   . Diaphragmatic hernia without mention of obstruction or gangrene   . Osteoarthrosis, unspecified whether generalized or localized, unspecified site    Past Surgical History  Procedure Laterality Date  . Abdominal hysterectomy    . Shoulder surgery     . Total hip arthroplasty      bilateral  . Knee arthroscopy      bilateral  . Colonoscopy  08/06/2011  . Foot surgery     Family History  Problem Relation Age of Onset  . Diabetes Mother   . Heart disease Mother   . Heart disease Brother   . Diabetes Brother   . Kidney disease Father   . Scoliosis Sister   . Diabetes Brother   . Diabetes Brother   . Heart disease Brother   . Scoliosis Brother   . Stroke Brother   . Heart attack Brother    History  Substance Use Topics  . Smoking status: Former Smoker    Types: Cigarettes    Quit date: 02/27/1997  . Smokeless tobacco: Never Used  . Alcohol Use: No   OB History    No data available     Review of Systems  Constitutional: Negative for appetite change and fatigue.  HENT: Negative for congestion, ear discharge and sinus pressure.   Eyes: Negative for discharge.  Respiratory: Positive for cough and shortness of breath.   Cardiovascular: Negative for chest pain.  Gastrointestinal: Negative for abdominal pain and diarrhea.  Genitourinary: Negative for frequency and hematuria.  Musculoskeletal: Negative for back pain.  Skin: Negative for rash.  Neurological: Negative for seizures and headaches.  Psychiatric/Behavioral: Negative for hallucinations.      Allergies  Tramadol; Codeine; Hydrocodone; Oxycodone; Tylenol; and Penicillins  Home Medications   Prior  to Admission medications   Medication Sig Start Date End Date Taking? Authorizing Provider  AMITIZA 8 MCG capsule TAKE 1 CAPSULE BY MOUTH TWICE DAILY WITH A MEAL   Yes Mahima Pandey, MD  aspirin 81 MG tablet Take 81 mg by mouth daily.     Yes Historical Provider, MD  cilostazol (PLETAL) 100 MG tablet TAKE 1 TABLET BY MOUTH EVERY DAY 09/20/14  Yes Mahima Pandey, MD  cloNIDine (CATAPRES) 0.1 MG tablet TAKE 1 TABLET BY MOUTH EVERY DAY 09/20/14  Yes Mahima Pandey, MD  cycloSPORINE (RESTASIS) 0.05 % ophthalmic emulsion 1 drop 2 (two) times daily. One drop once a day  bilaterally for dry eyes   Yes Historical Provider, MD  [place] X 5/16" 0.5 ML MISC USE UP TO 2 TIMES A DAY AS DIRECTED 09/20/14  Yes Mahima Pandey, MD  enalapril (VASOTEC) 5 MG tablet Take 5 mg by mouth daily.   Yes Historical Provider, MD  esomeprazole (NEXIUM) 40 MG capsule Take 40 mg by mouth 2 (two) times daily before a meal.   Yes Historical Provider, MD  etodolac (LODINE) 400 MG tablet Take 400 mg by mouth 2 (two) times daily. 07/16/14  Yes Historical Provider, MD  gabapentin (NEURONTIN) 100 MG capsule TAKE 1 CAPSULE BY MOUTH THREE TIMES A DAY 09/20/14  Yes Mahima Pandey, MD  hydrALAZINE (APRESOLINE) 25 MG tablet TAKE 1 TABLET BY MOUTH TWICE DAILY FOR HYPERTENSION 09/20/14  Yes Mahima Bubba Camp, MD  HYDROcodone-acetaminophen (NORCO) 10-325 MG per tablet 1 tablet by mouth every 8 hours as needed for moderate to severe pain 01/03/14  Yes Historical Provider, MD  insulin NPH-regular Human (HUMULIN 70/30) (70-30) 100 UNIT/ML injection Inject 35 units subcutaneously with breakfast to control blood sugar. 08/17/14  Yes Mahima Pandey, MD  metoprolol succinate (TOPROL-XL) 25 MG 24 hr tablet TAKE 1 TABLET BY MOUTH EVERY DAY WITH OR IMMEDIATELY FOLLOWING A MEAL FOR BLOOD PRESSURE 09/20/14  Yes Mahima Pandey, MD  NEXIUM 40 MG capsule TAKE 1 CAPSULE BY MOUTH TWICE DAILY FOR HEARTBURN 08/29/14  Yes Mahima Pandey, MD  potassium chloride SA (K-DUR,KLOR-CON) 20 MEQ tablet TAKE 1 TABLET BY MOUTH EVERY DAY WITH FOOD 09/20/14  Yes Mahima Pandey, MD  sertraline (ZOLOFT) 50 MG tablet TAKE 1 TABLET BY MOUTH DAILY 09/20/14  Yes Mahima Pandey, MD  simvastatin (ZOCOR) 10 MG tablet Take 10 mg by mouth every evening.   Yes Historical Provider, MD  sucralfate (CARAFATE) 1 G tablet Take 1 g by mouth 2 (two) times daily.   Yes Historical Provider, MD  timolol (TIMOPTIC) 0.5 % ophthalmic solution Place 1 drop into both eyes 2 (two) times daily. One drop in bilaterally twice a day as needed   Yes Historical  Provider, MD  torsemide (DEMADEX) 20 MG tablet Take 2 tablet in morning and 1 tablet in evening to help with leg edema 10/10/14  Yes Mahima Pandey, MD  Travoprost, BAK Free, (TRAVATAN) 0.004 % SOLN ophthalmic solution Place 1 drop into both eyes at bedtime. One drop bilaterally twice a day   Yes Historical Provider, MD  VENTOLIN HFA 108 (90 BASE) MCG/ACT inhaler INHALE 1 PUFF 4 TIMES A DAY FOR 5 DAYS, THEN USE EVERY 6 HOURS AS NEEDED   Yes Mahima Pandey, MD  VOLTAREN 1 % GEL APPLY 4 GRAM TOPICALLY TWICE DAILY 09/20/14  Yes Mahima Pandey, MD  Fluticasone-Salmeterol (ADVAIR DISKUS) 250-50 MCG/DOSE AEPB Inhale 1 puff into the lungs 2 (two) times daily. 10/10/14   Blanchie Serve, MD  BP 157/67 mmHg  Pulse 68  Temp(Src) 98.5 F (36.9 C) (Oral)  Resp 18  SpO2 92% Physical Exam  Constitutional: She is oriented to person, place, and time. She appears well-developed.  HENT:  Head: Normocephalic.  Eyes: Conjunctivae and EOM are normal. No scleral icterus.  Neck: Neck supple. No thyromegaly present.  Cardiovascular: Normal rate and regular rhythm.  Exam reveals no gallop and no friction rub.   No murmur heard. Pulmonary/Chest: No stridor. She has no wheezes. She has no rales. She exhibits no tenderness.  Abdominal: She exhibits no distension. There is no tenderness. There is no rebound.  Musculoskeletal: Normal range of motion. She exhibits no edema.  Lymphadenopathy:    She has no cervical adenopathy.  Neurological: She is oriented to person, place, and time. She exhibits normal muscle tone. Coordination normal.  Skin: No rash noted. No erythema.  Psychiatric: She has a normal mood and affect. Her behavior is normal.    ED Course  Procedures (including critical care time) Labs Review Labs Reviewed  COMPREHENSIVE METABOLIC PANEL - Abnormal; Notable for the following:    Sodium 136 (*)    Chloride 92 (*)    Glucose, Bld 354 (*)    BUN 49 (*)    Creatinine, Ser 1.78 (*)    GFR calc non Af  Amer 27 (*)    GFR calc Af Amer 31 (*)    Anion gap 16 (*)    All other components within normal limits  CBC WITH DIFFERENTIAL  PRO B NATRIURETIC PEPTIDE    Imaging Review Dg Chest 2 View  10/15/2014   CLINICAL DATA:  Shortness of breath, cough for 2 months, chest pain  EXAM: CHEST  2 VIEW  COMPARISON:  05/13/2014  FINDINGS: There is no focal parenchymal opacity, pleural effusion, or pneumothorax. There is stable cardiomegaly. There is mild pulmonary vascular congestion.  The osseous structures are unremarkable.  IMPRESSION: Mild stable cardiomegaly with pulmonary vascular congestion.   Electronically Signed   By: Kathreen Devoid   On: 10/15/2014 12:46     EKG Interpretation   Date/Time:  Monday October 15 2014 14:37:52 EST Ventricular Rate:  69 PR Interval:  143 QRS Duration: 92 QT Interval:  433 QTC Calculation: 464 R Axis:   37 Text Interpretation:  Sinus rhythm Baseline wander in lead(s) V2 Confirmed  by Albertina Leise  MD, Sabre Leonetti [phone]) on 10/15/2014 3:47:42 PM      MDM   Final diagnoses:  Cough    Pt with bronchitis and mild chf,  Will tx with zithromax and increase demadex for 2 days.  Pt to follow up with pcp later this week    Maudry Diego, MD 10/15/14 [place], MD 10/15/14 [phone]

## 2014-10-15 NOTE — ED Notes (Signed)
Pt ambulated to restroom with pts own walker. Attempting to get urine sample at this time.

## 2014-10-15 NOTE — Discharge Instructions (Signed)
Take an extra demadex pill on Tuesday and Wednesday.  Follow up with your md later this week.

## 2014-10-15 NOTE — ED Notes (Signed)
Pt c/o SOB and cough x 2 months, wen to PCP on 11/11 but was not given anything. Pt states she has a dry cough that hurts chest and waist now.

## 2014-10-15 NOTE — ED Notes (Signed)
Pt given crackers and water. RN aware

## 2014-10-16 ENCOUNTER — Other Ambulatory Visit: Payer: Self-pay | Admitting: *Deleted

## 2014-10-16 MED ORDER — INSULIN NPH ISOPHANE & REGULAR (70-30) 100 UNIT/ML ~~LOC~~ SUSP: SUBCUTANEOUS | Status: DC

## 2014-10-16 NOTE — Progress Notes (Signed)
Patient ID: Michelle Buck, female   DOB: [birth year], 78 y.o.   MRN: [id number]  Subjective: 78 year old female presents the office today for diabetic risk assessment as well as painful elongated toenails. She states that her nails are difficult to cut herself. She states that her blood sugar typically ridging 125 to 1:30 however he most recently has been as high as 280. She also states that she has swelling in her legs for which she has previously seen her primary care physician for. She was recently changed from Lasix to toresmide. She also states that she has neuropathy. No other complaints at this time.  ROS: Bilateral lower extremity edema and nail changes. Other systems reviewed and were negative.   Objective: AAO 3, NAD  DP/PT pulses palpable on somewhat decreased, CRT less than 3 seconds Decreased protective sensation with Simms Weinstein monofilament, decreased vibratory sensation. Nails hypertrophic, dystrophic, elongated, brittle, discolored 10. No surrounding erythema or drainage. No open lesions. No calf pain with compression, erythema, warmth. MMT 4/5, ROM WNL  Assessment: 78 year old female with peripheral neuropathy, symptomatic onychomycosis  Plan: -Treatment options discussed including alternatives, risks, complications. -Nail sharply debrided 10 without complications. -Discussed the importance of daily foot inspection. If there any changes are to occur she is instructed to call the office. -For bilateral lower extremity edema follow-up with primary care physician, likely related to CHF. -Follow-up in 3 months. In the meantime, call the office with any questions, concerns, changes symptoms.

## 2014-10-16 NOTE — Telephone Encounter (Signed)
Received fax form from [facility] confirming Humulin 70/30 Insulin--Inject 35 units subcutaneously with breakfast. Given to Dr. Bubba Camp to sign and review.

## 2014-10-17 ENCOUNTER — Other Ambulatory Visit: Payer: Self-pay | Admitting: Internal Medicine

## 2014-10-22 DIAGNOSIS — M25512 Pain in left shoulder: Secondary | ICD-10-CM | POA: Diagnosis not present

## 2014-10-22 DIAGNOSIS — M5030 Other cervical disc degeneration, unspecified cervical region: Secondary | ICD-10-CM | POA: Diagnosis not present

## 2014-10-29 ENCOUNTER — Other Ambulatory Visit: Payer: Self-pay | Admitting: Internal Medicine

## 2014-11-07 ENCOUNTER — Other Ambulatory Visit: Payer: Self-pay

## 2014-11-07 ENCOUNTER — Other Ambulatory Visit: Payer: Self-pay | Admitting: Internal Medicine

## 2014-11-07 MED ORDER — INSULIN NPH ISOPHANE & REGULAR (70-30) 100 UNIT/ML ~~LOC~~ SUSP: SUBCUTANEOUS | Status: DC

## 2014-11-19 DIAGNOSIS — H4011X1 Primary open-angle glaucoma, mild stage: Secondary | ICD-10-CM | POA: Diagnosis not present

## 2014-11-20 ENCOUNTER — Encounter: Payer: Self-pay | Admitting: Internal Medicine

## 2014-11-20 ENCOUNTER — Ambulatory Visit (INDEPENDENT_AMBULATORY_CARE_PROVIDER_SITE_OTHER): Payer: Medicare Other | Admitting: Internal Medicine

## 2014-11-20 VITALS — BP 128/70 | HR 60 | Temp 97.8°F | Resp 12 | Ht 64.0 in | Wt 230.0 lb

## 2014-11-20 DIAGNOSIS — N183 Chronic kidney disease, stage 3 (moderate): Secondary | ICD-10-CM | POA: Diagnosis not present

## 2014-11-20 DIAGNOSIS — N189 Chronic kidney disease, unspecified: Secondary | ICD-10-CM | POA: Diagnosis not present

## 2014-11-20 DIAGNOSIS — I10 Essential (primary) hypertension: Secondary | ICD-10-CM

## 2014-11-20 DIAGNOSIS — R600 Localized edema: Secondary | ICD-10-CM

## 2014-11-20 DIAGNOSIS — E1129 Type 2 diabetes mellitus with other diabetic kidney complication: Secondary | ICD-10-CM | POA: Diagnosis not present

## 2014-11-20 DIAGNOSIS — E1122 Type 2 diabetes mellitus with diabetic chronic kidney disease: Secondary | ICD-10-CM

## 2014-11-20 DIAGNOSIS — M170 Bilateral primary osteoarthritis of knee: Secondary | ICD-10-CM

## 2014-11-20 MED ORDER — HYDRALAZINE HCL 50 MG PO TABS: 50.0000 mg | ORAL_TABLET | Freq: Two times a day (BID) | ORAL | Status: DC

## 2014-11-20 MED ORDER — TORSEMIDE 20 MG PO TABS: ORAL_TABLET | ORAL | Status: DC

## 2014-11-20 MED ORDER — MELOXICAM 15 MG PO TABS: 15.0000 mg | ORAL_TABLET | Freq: Every day | ORAL | Status: DC

## 2014-11-20 MED ORDER — INSULIN NPH ISOPHANE & REGULAR (70-30) 100 UNIT/ML ~~LOC~~ SUSP: SUBCUTANEOUS | Status: DC

## 2014-11-20 NOTE — Progress Notes (Signed)
Patient ID: Michelle Buck, female   DOB: 11/27/1936, 78 y.o.   MRN: [id number]    Chief Complaint  Patient presents with  . Medical Management of Chronic Issues    4 month follow-up on blood sugar, clarify if patient ahould take lasix or demadex   . Ear Problem    Itchy ears "Way down inside ears are very itchy"   Allergies  Allergen Reactions  . Tramadol Other (See Comments)    Leg cramps   . Codeine Nausea And Vomiting    Patient states N/V with codeine  . Hydrocodone Nausea And Vomiting  . Oxycodone Other (See Comments)    Patient states she can tolerate oxycodone  . Tylenol [Acetaminophen] Hives  . Penicillins Rash    Patient states rash/itch with penicillin   HPI 78 y/o female patient is here for routine visit. She has history of DM, HTN, CHF and impaired kidney function. Reviewed cbg [id number]. No recent a1c. On  humalog mix 35 u daily for now bp readings well controlled Worsening kidney function on lab review. Denies any problem with urination knee pain has worsened and limits her mobility. Has hx of morbid obesity and using a walker  Wt Readings from Last 3 Encounters:  11/20/14 230 lb (104.327 kg)  10/12/14 230 lb (104.327 kg)  10/10/14 228 lb (103.42 kg)   Review of Systems  Constitutional: Negative for fever, chills, diaphoresis.  HENT: Negative for congestion Eyes: Negative for blurred vision, double vision and discharge.  Respiratory: Negative for cough, wheezing.  has dyspnea on exertion Cardiovascular: Negative for chest pain, palpitations. leg swelling has improved  Gastrointestinal: Negative for heartburn, nausea, vomiting, abdominal pain  Genitourinary: Negative for dysuria  Musculoskeletal: Negative for back pain, falls Skin: Negative for itching and rash.  Neurological: Negative for dizziness, headaches.   Past Medical History  Diagnosis Date  . Diabetes mellitus   . Hypertension   . Obese   . Hiatal hernia   . Scoliosis   . Arthritis   . Acute  bronchitis   . COPD (chronic obstructive pulmonary disease)   . Unspecified hereditary and idiopathic peripheral neuropathy   . Secondary diabetes mellitus with renal manifestations, not stated as uncontrolled, or unspecified   . Chronic kidney disease, stage II (mild)   . Chronic kidney disease, stage II (mild)   . Chronic kidney disease, unspecified   . Disorder of bone and cartilage, unspecified   . Complications affecting other specified body systems, hypertension   . Hypopotassemia   . Anxiety   . Congestive heart failure, unspecified   . GERD (gastroesophageal reflux disease)   . Lumbago   . Insomnia, unspecified   . Hyperlipidemia   . Unspecified glaucoma   . Diaphragmatic hernia without mention of obstruction or gangrene   . Osteoarthrosis, unspecified whether generalized or localized, unspecified site    Current Outpatient Prescriptions on File Prior to Visit  Medication Sig Dispense Refill  . AMITIZA 8 MCG capsule TAKE 1 CAPSULE BY MOUTH TWICE DAILY WITH A MEAL 60 capsule 5  . aspirin 81 MG tablet Take 81 mg by mouth daily.      . cilostazol (PLETAL) 100 MG tablet TAKE 1 TABLET BY MOUTH EVERY DAY 30 tablet 4  . cloNIDine (CATAPRES) 0.1 MG tablet TAKE 1 TABLET BY MOUTH EVERY DAY 30 tablet 4  . EASY TOUCH INSULIN SYRINGE 31G X 5/16" 0.5 ML MISC USE UP TO 2 TIMES A DAY AS DIRECTED 10 each 4  . Fluticasone-Salmeterol (ADVAIR  DISKUS) 250-50 MCG/DOSE AEPB Inhale 1 puff into the lungs 2 (two) times daily. 60 each 3  . gabapentin (NEURONTIN) 100 MG capsule TAKE 1 CAPSULE BY MOUTH THREE TIMES A DAY 90 capsule 4  . guaifenesin (ROBITUSSIN) 100 MG/5ML syrup Take 5-10 mLs (100-200 mg total) by mouth every 4 (four) hours as needed for cough. 60 mL 0  . HYDROcodone-acetaminophen (NORCO) 10-325 MG per tablet 1 tablet by mouth every 8 hours as needed for moderate to severe pain    . metoprolol succinate (TOPROL-XL) 25 MG 24 hr tablet TAKE 1 TABLET BY MOUTH EVERY DAY WITH OR IMMEDIATELY  FOLLOWING A MEAL FOR BLOOD PRESSURE 30 tablet 4  . NEXIUM 40 MG capsule TAKE 1 CAPSULE BY MOUTH TWICE DAILY FOR HEARTBURN 60 capsule 4  . potassium chloride SA (K-DUR,KLOR-CON) 20 MEQ tablet TAKE 1 TABLET BY MOUTH EVERY DAY WITH FOOD 30 tablet 4  . sertraline (ZOLOFT) 50 MG tablet TAKE 1 TABLET BY MOUTH DAILY 30 tablet 4  . simvastatin (ZOCOR) 10 MG tablet Take 10 mg by mouth every evening.    . sucralfate (CARAFATE) 1 G tablet Take 1 g by mouth 2 (two) times daily.    . timolol (TIMOPTIC) 0.5 % ophthalmic solution Place 1 drop into both eyes 2 (two) times daily. One drop in bilaterally twice a day as needed    . Travoprost, BAK Free, (TRAVATAN) 0.004 % SOLN ophthalmic solution Place 1 drop into both eyes at bedtime. One drop bilaterally twice a day    . VENTOLIN HFA 108 (90 BASE) MCG/ACT inhaler INHALE 1 PUFF 4 TIMES A DAY FOR 5 DAYS, THEN USE EVERY 6 HOURS AS NEEDED 18 each 5  . VOLTAREN 1 % GEL APPLY 4 GRAM TOPICALLY TWICE DAILY 100 g 4  . cycloSPORINE (RESTASIS) 0.05 % ophthalmic emulsion 1 drop 2 (two) times daily. One drop once a day bilaterally for dry eyes     No current facility-administered medications on file prior to visit.    Physical exam BP 128/70 mmHg  Pulse 60  Temp(Src) 97.8 F (36.6 C) (Oral)  Resp 12  Ht 5\' 4"  (1.626 m)  Wt 230 lb (104.327 kg)  BMI 39.46 kg/m2  SpO2 91%   General- elderly female in no acute distress, morbidly obese Head- atraumatic, normocephalic Eyes- PERRLA, EOMI, no pallor, no icterus, no discharge Neck- no lymphadenopathy Cardiovascular- normal s1,s2, no murmurs, has trace leg edema Respiratory- bilateral poor air entry, no wheeze, no rhonchi, no crackles Abdomen- bowel sounds present, soft, non tender Musculoskeletal- has to push herself with her hand to get up from the chair. Both UE abduction limited to 35-40 degrees. External rotation illicits pain. Has crepitus in both her shoulder joint. Strength 3/5 in upper extremity with pain of  7-8/10. Strength 3/5 in lower extremity. Lower extremity external rotation at hip joint area limited with pain of 8/10. She takes small steps and reaches out for support. Has shuffling gait. rom in both knees limited with crepitus on exam Neurological- normal sensation to fine touch and vibration Skin- warm and dry. No skin tear or sore noted in feet or ankle Psychiatry- alert and oriented to person, place and time, normal mood and affect  labs CMP Latest Ref Rng 10/15/2014 10/10/2014 07/13/2014  Glucose 70 - 99 mg/dL 354(H) 211(H) 230(H)  BUN 6 - 23 mg/dL 49(H) 23 17  Creatinine 0.50 - 1.10 mg/dL 1.78(H) 1.70(H) 1.41(H)  Sodium 137 - 147 mEq/L 136(L) 141 141  Potassium 3.7 - 5.3  mEq/L 3.7 3.3(L) 3.7  Chloride 96 - 112 mEq/L 92(L) 96(L) 96(L)  CO2 19 - 32 mEq/L 28 28 30(H)  Calcium 8.4 - 10.5 mg/dL 9.5 9.3 9.5  Total Protein 6.0 - 8.3 g/dL 8.0 6.8 -  Albumin 3.5 - 4.8 g/dL - 3.9 -  Total Bilirubin 0.3 - 1.2 mg/dL 0.3 <0.2 -  Alkaline Phos 39 - 117 U/L 101 102 -  AST 0 - 37 U/L 14 13 -  ALT 0 - 35 U/L 7 6 -   Lab Results  Component Value Date   HGBA1C 7.5* 06/11/2014   Lipid Panel     Component Value Date/Time   CHOL 144 05/14/2010 2347   TRIG 145 06/11/2014 0926   HDL 58 06/11/2014 0926   HDL 48 05/14/2010 2347   CHOLHDL 2.5 06/11/2014 0926   CHOLHDL 3.0 Ratio 05/14/2010 2347   VLDL 40 05/14/2010 2347   LDLCALC 56 06/11/2014 0926   LDLCALC 56 05/14/2010 2347    Assessment/plan  CKD stage 3 Her DM and HTN could both be contributing to this but her etodolac and vasotec with lasix is likely worsening it. D/c enalapril and lasix with etodolac to prevent further worsening of renal function. Monitor renal function  HTN bp stable. D/c enalapril and lasix and with this increasing risk for bp elevation, Change hydralazine to 50 mg bid from 25 mg bid for now. Monitor bp and adjust med further if needed  Leg edema Improved. Taking demadex 40 mg in am and 20 mg pm. Change this to  20 mg bid for now. Monitor bmp   DM Check a1c and urine microalbumin for now. On humulin 35 u in am. With elevated cbg reading, change humulin to 30 u in am and 10 u in pm. Record cbg and to see [name] for med adjustment in 2-3 weeks. Off ACEI with impaired renal function. Continue statin and aspirin  Knee OA Continue prn norco and add meloxicam 15 mg daily, d.c etodolac

## 2014-11-20 NOTE — Patient Instructions (Signed)
Stop taking lasix/ furosemide and vasotec from today  Stop taking etodolac  These medications are stopped with your worsening kidney function  Your sugar medication has changed, please take this as instructed, keep a log of your blood sugar reading and bring it to office to review with [name] in your next visit

## 2014-11-21 LAB — HEMOGLOBIN A1C
ESTIMATED AVERAGE GLUCOSE: 232 mg/dL
HEMOGLOBIN A1C: 9.7 % — AB (ref 4.8–5.6)

## 2014-11-21 LAB — MICROALBUMIN / CREATININE URINE RATIO: CREATININE UR: 81.6 mg/dL (ref 15.0–278.0)

## 2014-11-27 ENCOUNTER — Telehealth: Payer: Self-pay | Admitting: *Deleted

## 2014-11-27 NOTE — Telephone Encounter (Signed)
Patient called and stated that you told her that you were going to give her pain medication. Said that you started talking about it at the last OV about you taking over prescribing, but nothing was done. Rx is Hydrocodone 10/325 Take one tablet every 8 hours as needed for pain. It was prescribed by Dr. Eulas Post. Please Advise.

## 2014-11-28 NOTE — Telephone Encounter (Signed)
Patient Notified and stated that she will check her paperwork

## 2014-11-28 NOTE — Telephone Encounter (Signed)
i had spoken with patient about discontinuing etodolac and starting meloxicam. i had provided her scripts on it

## 2014-12-12 ENCOUNTER — Other Ambulatory Visit: Payer: Self-pay | Admitting: Internal Medicine

## 2014-12-17 ENCOUNTER — Encounter: Payer: Self-pay | Admitting: Pharmacotherapy

## 2014-12-17 ENCOUNTER — Ambulatory Visit (INDEPENDENT_AMBULATORY_CARE_PROVIDER_SITE_OTHER): Payer: Medicare Other | Admitting: Pharmacotherapy

## 2014-12-17 VITALS — BP 144/82 | HR 56 | Temp 96.6°F | Ht 64.0 in | Wt 223.0 lb

## 2014-12-17 DIAGNOSIS — E1122 Type 2 diabetes mellitus with diabetic chronic kidney disease: Secondary | ICD-10-CM | POA: Diagnosis not present

## 2014-12-17 DIAGNOSIS — N183 Chronic kidney disease, stage 3 unspecified: Secondary | ICD-10-CM

## 2014-12-17 DIAGNOSIS — I10 Essential (primary) hypertension: Secondary | ICD-10-CM | POA: Diagnosis not present

## 2014-12-17 DIAGNOSIS — N189 Chronic kidney disease, unspecified: Secondary | ICD-10-CM

## 2014-12-17 MED ORDER — INSULIN NPH ISOPHANE & REGULAR (70-30) 100 UNIT/ML ~~LOC~~ SUSP: SUBCUTANEOUS | Status: DC

## 2014-12-17 NOTE — Progress Notes (Signed)
  Subjective:    Michelle Buck is a 79 y.o. African American  female who presents for follow-up of Type 2 diabetes mellitus.   A1C elevated from 7.5% to 9.7% Her BG is consistently in the 200's. No hypoglycemia.  Lowest BG:  162m/dl She says she is giving 70/30 - 35 units AM, 10 units PM.  Denies missed doses of insulin.  Her food choices are not great.  High fat, high carb. No exercise. Complains of leg and back pain.   Using gabapentin for peripheral neuropathy. Denies problems with vision. Nocturia 3-4 times per night. Some peripheral edema.   Review of Systems A comprehensive review of systems was negative except for: Cardiovascular: positive for lower extremity edema Genitourinary: positive for nocturia Musculoskeletal: positive for back pain and stiff joints Neurological: positive for peripheral neuropathy    Objective:    BP 144/82 mmHg  Pulse 56  Temp(Src) 96.6 F (35.9 C) (Oral)  Ht 5' 4"$  (1.626 m)  Wt 223 lb (101.152 kg)  BMI 38.26 kg/m2  SpO2 94%  General:  alert, cooperative and no distress  Oropharynx: normal findings: lips normal without lesions and gums healthy   Eyes:  negative findings: lids and lashes normal and conjunctivae and sclerae normal   Ears:  external ears normal        Lung: clear to auscultation bilaterally  Heart:  regular rate and rhythm     Extremities: edema bilateral lower extremities  Skin: dry     Neuro: mental status, speech normal, alert and oriented x3   Lab Review GLUCOSE (mg/dL)  Date Value  10/10/2014 211*  07/13/2014 230*  07/03/2014 138*   GLUCOSE, BLD (mg/dL)  Date Value  10/15/2014 354*  05/13/2014 173*  03/01/2012 212*   CO2  Date Value  10/15/2014 28 mEq/L  10/10/2014 28 mmol/L  07/13/2014 30 mmol/L*   BUN (mg/dL)  Date Value  10/15/2014 49*  10/10/2014 23  07/13/2014 17  07/03/2014 13  05/13/2014 18  03/01/2012 22   CREATININE, SER (mg/dL)  Date Value  10/15/2014 1.78*  10/10/2014 1.70*   07/13/2014 1.41*    11/20/14 - A1C:  9.7%   Assessment:    Diabetes Mellitus type II, under poor control. A1C higher. BP slightly above target <140/90   Plan:    1.  Rx changes: increase 70/30 insulin to 40 units AM, 20 units PM.  2.  Not a candidate for metformin, GLP-1, SGLT-2 due to CKD. 3.  Counseled on nutrition goals. 4.  Counseled on need for routine exercise.  Discussed chair exercise.  Goal is 30-45 minutes 5 x week. 5.  BP slightly above target.  Will continue current RX and monitor. 6.  RTC in 3 months.

## 2014-12-17 NOTE — Patient Instructions (Signed)
Increase insulin to 40 units in the morning and 20 units in the evening.

## 2015-01-03 ENCOUNTER — Other Ambulatory Visit: Payer: Self-pay | Admitting: Internal Medicine

## 2015-01-04 DIAGNOSIS — M1000 Idiopathic gout, unspecified site: Secondary | ICD-10-CM | POA: Diagnosis not present

## 2015-01-08 ENCOUNTER — Telehealth: Payer: Self-pay | Admitting: *Deleted

## 2015-01-08 NOTE — Telephone Encounter (Signed)
Sent Prior Auth through [employer] for Humulin 70/30. Key [id number].  [facility] # [id number] sent over Prior Auth Request. Completed online and faxed. Went into clinical Review.  Sent Prior Auth through [employer] for Nexium. Key [id number]. [facility] # [id number] sent over Prior Auth Request. Completed online and faxed. Went into clinical Review.

## 2015-01-09 ENCOUNTER — Ambulatory Visit (INDEPENDENT_AMBULATORY_CARE_PROVIDER_SITE_OTHER): Payer: Medicare Other | Admitting: Podiatry

## 2015-01-09 ENCOUNTER — Ambulatory Visit (INDEPENDENT_AMBULATORY_CARE_PROVIDER_SITE_OTHER): Payer: Medicare Other

## 2015-01-09 ENCOUNTER — Encounter: Payer: Self-pay | Admitting: Podiatry

## 2015-01-09 DIAGNOSIS — B351 Tinea unguium: Secondary | ICD-10-CM

## 2015-01-09 DIAGNOSIS — R52 Pain, unspecified: Secondary | ICD-10-CM

## 2015-01-09 DIAGNOSIS — M79676 Pain in unspecified toe(s): Secondary | ICD-10-CM | POA: Diagnosis not present

## 2015-01-09 DIAGNOSIS — M19079 Primary osteoarthritis, unspecified ankle and foot: Secondary | ICD-10-CM

## 2015-01-09 DIAGNOSIS — E1149 Type 2 diabetes mellitus with other diabetic neurological complication: Secondary | ICD-10-CM

## 2015-01-09 DIAGNOSIS — E1140 Type 2 diabetes mellitus with diabetic neuropathy, unspecified: Secondary | ICD-10-CM | POA: Diagnosis not present

## 2015-01-09 DIAGNOSIS — M2040 Other hammer toe(s) (acquired), unspecified foot: Secondary | ICD-10-CM

## 2015-01-09 NOTE — Telephone Encounter (Signed)
Humulin 70/30 Approved through pharmacy till 11/30/2015. Referral #: [id number]. Patient Notified and still awaiting the nexium.

## 2015-01-09 NOTE — Telephone Encounter (Signed)
Received Approval for Nexium. Approved through 11/30/2015

## 2015-01-09 NOTE — Patient Instructions (Signed)
Diabetes and Foot Care Diabetes may cause you to have problems because of poor blood supply (circulation) to your feet and legs. This may cause the skin on your feet to become thinner, break easier, and heal more slowly. Your skin may become dry, and the skin may peel and crack. You may also have nerve damage in your legs and feet causing decreased feeling in them. You may not notice minor injuries to your feet that could lead to infections or more serious problems. Taking care of your feet is one of the most important things you can do for yourself.  HOME CARE INSTRUCTIONS  Wear shoes at all times, even in the house. Do not go barefoot. Bare feet are easily injured.  Check your feet daily for blisters, cuts, and redness. If you cannot see the bottom of your feet, use a mirror or ask someone for help.  Wash your feet with warm water (do not use hot water) and mild soap. Then pat your feet and the areas between your toes until they are completely dry. Do not soak your feet as this can dry your skin.  Apply a moisturizing lotion or petroleum jelly (that does not contain alcohol and is unscented) to the skin on your feet and to dry, brittle toenails. Do not apply lotion between your toes.  Trim your toenails straight across. Do not dig under them or around the cuticle. File the edges of your nails with an emery board or nail file.  Do not cut corns or calluses or try to remove them with medicine.  Wear clean socks or stockings every day. Make sure they are not too tight. Do not wear knee-high stockings since they may decrease blood flow to your legs.  Wear shoes that fit properly and have enough cushioning. To break in new shoes, wear them for just a few hours a day. This prevents you from injuring your feet. Always look in your shoes before you put them on to be sure there are no objects inside.  Do not cross your legs. This may decrease the blood flow to your feet.  If you find a minor scrape,  cut, or break in the skin on your feet, keep it and the skin around it clean and dry. These areas may be cleansed with mild soap and water. Do not cleanse the area with peroxide, alcohol, or iodine.  When you remove an adhesive bandage, be sure not to damage the skin around it.  If you have a wound, look at it several times a day to make sure it is healing.  Do not use heating pads or hot water bottles. They may burn your skin. If you have lost feeling in your feet or legs, you may not know it is happening until it is too late.  Make sure your health care provider performs a complete foot exam at least annually or more often if you have foot problems. Report any cuts, sores, or bruises to your health care provider immediately. SEEK MEDICAL CARE IF:   You have an injury that is not healing.  You have cuts or breaks in the skin.  You have an ingrown nail.  You notice redness on your legs or feet.  You feel burning or tingling in your legs or feet.  You have pain or cramps in your legs and feet.  Your legs or feet are numb.  Your feet always feel cold. SEEK IMMEDIATE MEDICAL CARE IF:   There is increasing redness,   swelling, or pain in or around a wound.  There is a red line that goes up your leg.  Pus is coming from a wound.  You develop a fever or as directed by your health care provider.  You notice a bad smell coming from an ulcer or wound. Document Released: 11/13/2000 Document Revised: 07/19/2013 Document Reviewed: 04/25/2013 Exit[employer] Patient Information 2015 [employer]. This information is not intended to replace advice given to you by your health care provider. Make sure you discuss any questions you have with your health care provider.  

## 2015-01-15 NOTE — Progress Notes (Signed)
Patient ID: Michelle Buck, female   DOB: 1936/08/29, 79 y.o.   MRN: [id number]   Subjective: 79 year old female presents the office today for painful elongated toenails for which she is unable to cut herself. She denies any recent redness or drainage from the nail sites. She also states that over the last several months she has noticed some discomfort on the top of her right foot particularly with ambulation and weightbearing for prolonged period of time. She denies any history of injury or trauma to the area. She denies any recent swelling or redness overlying the area. She did see her primary care physician for this as well he was unable to identity the cause. She's had no prior treatment for this. No other complaints at this time.  Objective: AAO 3, NAD  DP/PT pulses palpable, CRT less than 3 seconds Decreased protective sensation with Simms Weinstein monofilament, decreased vibratory sensation. Achilles tendon reflex intact. Nails hypertrophic, dystrophic, elongated, brittle, discolored 10. No surrounding erythema or drainage. No open lesions. There subjective tenderness along the nails 10. MMT 4/5, ROM WNL. Hammertoe contractures identified as well as significant decrease in medial arch height upon weightbearing. There is mild diffuse tenderness along the dorsal aspect of the midfoot however there is no specific area pinpoint bony tenderness or pain with vibratory sensation. No calf pain with compression, erythema, warmth.  Assessment: 79 year old female with peripheral neuropathy, symptomatic onychomycosis; flatfoot deformity.  Plan: -X-rays were obtained and reviewed with the patient. There is a negative calcaneal inclination angle with significant flatfoot deformity present. No definitive fractures identified. -Treatment options discussed including alternatives, risks, complications. -Nail sharply debrided 10 without complications/bleeding. -Discussed the importance of daily foot  inspection. If there any changes are to occur she is instructed to call the office. -Recommend diabetic shoes. Paperwork to allow to have the shoes precertified. -Follow-up in 3 months. In the meantime, call the office with any questions, concerns, changes symptoms.

## 2015-01-29 ENCOUNTER — Ambulatory Visit (INDEPENDENT_AMBULATORY_CARE_PROVIDER_SITE_OTHER): Payer: Medicare Other | Admitting: Internal Medicine

## 2015-01-29 VITALS — BP 160/88 | HR 92 | Temp 98.1°F | Resp 16 | Ht 64.0 in | Wt 232.8 lb

## 2015-01-29 DIAGNOSIS — M170 Bilateral primary osteoarthritis of knee: Secondary | ICD-10-CM

## 2015-01-29 DIAGNOSIS — E1165 Type 2 diabetes mellitus with hyperglycemia: Secondary | ICD-10-CM | POA: Diagnosis not present

## 2015-01-29 DIAGNOSIS — F329 Major depressive disorder, single episode, unspecified: Secondary | ICD-10-CM

## 2015-01-29 DIAGNOSIS — M48061 Spinal stenosis, lumbar region without neurogenic claudication: Secondary | ICD-10-CM

## 2015-01-29 DIAGNOSIS — M4806 Spinal stenosis, lumbar region: Secondary | ICD-10-CM | POA: Diagnosis not present

## 2015-01-29 DIAGNOSIS — IMO0002 Reserved for concepts with insufficient information to code with codable children: Secondary | ICD-10-CM

## 2015-01-29 DIAGNOSIS — N183 Chronic kidney disease, stage 3 (moderate): Secondary | ICD-10-CM

## 2015-01-29 DIAGNOSIS — K5901 Slow transit constipation: Secondary | ICD-10-CM

## 2015-01-29 DIAGNOSIS — E1129 Type 2 diabetes mellitus with other diabetic kidney complication: Secondary | ICD-10-CM

## 2015-01-29 DIAGNOSIS — E1143 Type 2 diabetes mellitus with diabetic autonomic (poly)neuropathy: Secondary | ICD-10-CM | POA: Diagnosis not present

## 2015-01-29 DIAGNOSIS — I10 Essential (primary) hypertension: Secondary | ICD-10-CM | POA: Diagnosis not present

## 2015-01-29 DIAGNOSIS — F411 Generalized anxiety disorder: Secondary | ICD-10-CM | POA: Diagnosis not present

## 2015-01-29 DIAGNOSIS — R600 Localized edema: Secondary | ICD-10-CM | POA: Diagnosis not present

## 2015-01-29 DIAGNOSIS — F32A Depression, unspecified: Secondary | ICD-10-CM

## 2015-01-29 MED ORDER — INSULIN NPH ISOPHANE & REGULAR (70-30) 100 UNIT/ML ~~LOC~~ SUSP: SUBCUTANEOUS | Status: DC

## 2015-01-29 MED ORDER — ENALAPRIL MALEATE 10 MG PO TABS: ORAL_TABLET | ORAL | Status: DC

## 2015-01-29 MED ORDER — DULOXETINE HCL 30 MG PO CPEP: ORAL_CAPSULE | ORAL | Status: DC

## 2015-01-29 MED ORDER — TORSEMIDE 20 MG PO TABS
ORAL_TABLET | ORAL | Status: DC
Start: 2015-01-29 — End: 2015-02-19

## 2015-01-29 NOTE — Progress Notes (Signed)
Patient ID: Michelle Buck, female   DOB: [birth year], 79 y.o.   MRN: [id number]    Facility  [facility]    Place of Service:   OFFICE   Allergies  Allergen Reactions  . Tramadol Other (See Comments)    Leg cramps   . Codeine Nausea And Vomiting    Patient states N/V with codeine  . Hydrocodone Nausea And Vomiting  . Oxycodone Other (See Comments)    Patient states she can tolerate oxycodone  . Tylenol [Acetaminophen] Hives  . Penicillins Rash    Patient states rash/itch with penicillin    Chief Complaint  Patient presents with  . Acute Visit    whezzing, hurting all over,    HPI:  Patient comes the office with complaints of hurting all over, having some increased difficulty with breathing and wheezing, inability to lie flat at night, and extreme nervousness. All complaints seem to be more or less chronic.  Spinal stenosis of lumbar region: Continues with significant discomfort in the lower back with some radiation into both legs  Essential hypertension, benign: Elevation in systolic blood pressure  DM type 2, uncontrolled, with renal complications: Some improvement in diabetes was noted on higher insulin doses, but she reduce the dosing to 35 units in the morning and 10 units in the evening. Over the last week, following reduction of her insulin, her blood sugars have been running in the mid 200s. Patient says she reduce the insulin because it was making her feel too hungry and she was gaining weight. She denies having any hypoglycemic episodes.  Depression: Admits to sadness much the time  CKD (chronic kidney disease), stage 3 (moderate): chronic and unchanged  Generalized anxiety disorder: Says that she needs something for her nerves  Diabetic autonomic neuropathy associated with type 2 diabetes mellitus: Numbness and discomfort in both feet.   Cannot lay down in bed flat due to increased difficulty of breathing  Complains of her hiatal hernia" driving me crazy". Food doesn't  seem to go down well.   Constipation: Chronic problem. Using [employer].    Medications: Patient's Medications  New Prescriptions   No medications on file  Previous Medications   AMITIZA 8 MCG CAPSULE    TAKE 1 CAPSULE BY MOUTH TWICE DAILY WITH A MEAL   ASPIRIN 81 MG TABLET    Take 81 mg by mouth daily.     CILOSTAZOL (PLETAL) 100 MG TABLET    TAKE 1 TABLET BY MOUTH EVERY DAY   CLONIDINE (CATAPRES) 0.1 MG TABLET    TAKE 1 TABLET BY MOUTH EVERY DAY   CYCLOSPORINE (RESTASIS) 0.05 % OPHTHALMIC EMULSION    1 drop 2 (two) times daily. One drop once a day bilaterally for dry eyes   EASY TOUCH INSULIN SYRINGE 31G X 5/16" 0.5 ML MISC    USE UP TO 2 TIMES A DAY AS DIRECTED   ENALAPRIL (VASOTEC) 5 MG TABLET       FLUTICASONE-SALMETEROL (ADVAIR DISKUS) 250-50 MCG/DOSE AEPB    Inhale 1 puff into the lungs 2 (two) times daily.   GUAIFENESIN (ROBITUSSIN) 100 MG/5ML SYRUP    Take 5-10 mLs (100-200 mg total) by mouth every 4 (four) hours as needed for cough.   HYDRALAZINE (APRESOLINE) 50 MG TABLET    Take 1 tablet (50 mg total) by mouth 2 (two) times daily.   HYDROCODONE-ACETAMINOPHEN (NORCO) 10-325 MG PER TABLET    1 tablet by mouth every 8 hours as needed for moderate to severe pain   INSULIN  NPH-REGULAR HUMAN (HUMULIN 70/30) (70-30) 100 UNIT/ML INJECTION    Inject 40 units subcutaneously with breakfast and 20 units with dinner to control blood sugar.   MELOXICAM (MOBIC) 15 MG TABLET    Take 1 tablet (15 mg total) by mouth daily.   METOPROLOL SUCCINATE (TOPROL-XL) 25 MG 24 HR TABLET    TAKE 1 TABLET BY MOUTH EVERY DAY WITH OR IMMEDIATELY FOLLOWING A MEAL FOR BLOOD PRESSURE   NEXIUM 40 MG CAPSULE    TAKE 1 CAPSULE BY MOUTH TWICE DAILY FOR HEARTBURN   POTASSIUM CHLORIDE SA (K-DUR,KLOR-CON) 20 MEQ TABLET    TAKE 1 TABLET BY MOUTH EVERY DAY WITH FOOD   SERTRALINE (ZOLOFT) 50 MG TABLET    TAKE 1 TABLET BY MOUTH DAILY   SIMVASTATIN (ZOCOR) 10 MG TABLET    Take 10 mg by mouth every evening.   SUCRALFATE  (CARAFATE) 1 G TABLET    Take 1 g by mouth 2 (two) times daily.   TIMOLOL (TIMOPTIC) 0.5 % OPHTHALMIC SOLUTION    Place 1 drop into both eyes 2 (two) times daily. One drop in bilaterally twice a day as needed   TORSEMIDE (DEMADEX) 20 MG TABLET    Take 1 tablet in morning and 1 tablet in evening to help with leg edema   TRAVOPROST, BAK FREE, (TRAVATAN) 0.004 % SOLN OPHTHALMIC SOLUTION    Place 1 drop into both eyes at bedtime. One drop bilaterally twice a day   VENTOLIN HFA 108 (90 BASE) MCG/ACT INHALER    INHALE 1 PUFF 4 TIMES A DAY FOR 5 DAYS, THEN USE EVERY 6 HOURS AS NEEDED   VOLTAREN 1 % GEL    APPLY 4 GRAM TOPICALLY TWICE DAILY  Modified Medications   No medications on file  Discontinued Medications   GABAPENTIN (NEURONTIN) 100 MG CAPSULE    TAKE 1 CAPSULE BY MOUTH THREE TIMES A DAY     Review of Systems  Constitutional: Negative for fever, chills, diaphoresis, appetite change and unexpected weight change.  HENT: Negative for congestion, ear discharge and ear pain.   Eyes: Negative for visual disturbance.  Respiratory: Positive for cough. Negative for shortness of breath and wheezing.   Cardiovascular: Negative for chest pain and palpitations.  Genitourinary: Negative for dysuria.  Musculoskeletal: Positive for back pain and arthralgias.  Skin: Negative for color change, rash and wound.  Neurological: Negative for dizziness and light-headedness.  Hematological: Negative for adenopathy.  Psychiatric/Behavioral: Negative for behavioral problems, dysphoric mood and agitation. The patient is not nervous/anxious.     Filed Vitals:   01/30/15 1021  BP: 160/88  Pulse: 92  Temp: 98.1 F (36.7 C)  Resp: 16  Height: 5\' 4"  (1.626 m)  Weight: 232 lb 12.8 oz (105.597 kg)   Body mass index is 39.94 kg/(m^2).  Physical Exam  Constitutional: She is oriented to person, place, and time.  obese  HENT:  Right Ear: External ear normal.  Left Ear: External ear normal.  Nose: Nose normal.    Mouth/Throat: Oropharynx is clear and moist. No oropharyngeal exudate.  Eyes: Conjunctivae and EOM are normal. Pupils are equal, round, and reactive to light.  Neck: No JVD present. No tracheal deviation present. No thyromegaly present.  Cardiovascular: Normal rate, regular rhythm, normal heart sounds and intact distal pulses.  Exam reveals no gallop and no friction rub.   No murmur heard. Pulmonary/Chest: No respiratory distress. She has no wheezes. She has rales. She exhibits no tenderness.  Abdominal: She exhibits no distension and no  mass. There is no tenderness.  Large scar of the lower mid abdomen due to several operations for adhesions.  Musculoskeletal: Normal range of motion. She exhibits no edema or tenderness.  Bilateral flat feet  Lymphadenopathy:    She has no cervical adenopathy.  Neurological: She is alert and oriented to person, place, and time. No cranial nerve deficit. Coordination normal.  Skin: No rash noted. No erythema. No pallor.  Psychiatric: She has a normal mood and affect. Her behavior is normal. Thought content normal.     Labs reviewed: Office Visit on 11/20/2014  Component Date Value Ref Range Status  . Hgb A1c MFr Bld 11/20/2014 9.7* 4.8 - 5.6 % Final   Comment:          Pre-diabetes: 5.7 - 6.4          Diabetes: >6.4          Glycemic control for adults with diabetes: <7.0   . Est. average glucose Bld gHb Est-m* 11/20/2014 232   Final  . Creatinine, Ur 11/20/2014 81.6  15.0 - 278.0 mg/dL Final  . Microalbum.,U,Random 11/20/2014 <3.0  0.0 - 17.0 ug/mL Final  . MICROALB/CREAT RATIO 11/20/2014 <3.7  0.0 - 30.0 mg/g creat Final     Assessment/Plan  1. Spinal stenosis of lumbar region Chronic problem with low back pain with some radiation into the legs  2. Essential hypertension, benign Increase her medication for hypertension. Past history indicates episodes of bradycardia, so I have chosen increase her enalapril. - enalapril (VASOTEC) 10 MG  tablet; One daily to control BP and to strengthen the heart  Dispense: 30 tablet; Refill: 5  3. DM type 2, uncontrolled, with renal complications Very poor control with elevation of her A1c. Patient was strongly encouraged to resume higher doses of insulin - insulin NPH-regular Human (HUMULIN 70/30) (70-30) 100 UNIT/ML injection; Inject 40 units subcutaneously with breakfast and 20 units with dinner to control blood sugar.  Dispense: 20 mL; Refill: 5  4. Depression Added Cymbalta 30 mg daily  5. CKD (chronic kidney disease), stage 3 (moderate) Continue to observe with regular lab checks  6. Generalized anxiety disorder - DULoxetine (CYMBALTA) 30 MG capsule; One daily to help anxiety and pains  Dispense: 30 capsule; Refill: 3  7. Diabetic autonomic neuropathy associated with type 2 diabetes mellitus - DULoxetine (CYMBALTA) 30 MG capsule; One daily to help anxiety and pains  Dispense: 30 capsule; Refill: 3  8. Bilateral edema of lower extremity - torsemide (DEMADEX) 20 MG tablet; Take 1 tablet in morning and 1 tablet after lunch to help with leg edema  Dispense: 90 tablet; Refill: 3  9. Primary osteoarthritis of both knees Continue Vicodin  10. Slow transit constipation Continue LINZESS

## 2015-01-29 NOTE — Patient Instructions (Addendum)
Increase enalapril to 10 mg daily. Take the torsemide in the morning and after lunch (not at bed). Resume insulin at 40 units in the morning and 20 units before evening meal.

## 2015-02-01 ENCOUNTER — Other Ambulatory Visit: Payer: Self-pay | Admitting: *Deleted

## 2015-02-01 DIAGNOSIS — E1129 Type 2 diabetes mellitus with other diabetic kidney complication: Secondary | ICD-10-CM

## 2015-02-01 DIAGNOSIS — IMO0002 Reserved for concepts with insufficient information to code with codable children: Secondary | ICD-10-CM

## 2015-02-01 DIAGNOSIS — E1165 Type 2 diabetes mellitus with hyperglycemia: Principal | ICD-10-CM

## 2015-02-01 MED ORDER — INSULIN NPH ISOPHANE & REGULAR (70-30) 100 UNIT/ML ~~LOC~~ SUSP: SUBCUTANEOUS | Status: DC

## 2015-02-01 NOTE — Telephone Encounter (Signed)
Patient Requested to be faxed to pharmacy. 

## 2015-02-06 ENCOUNTER — Other Ambulatory Visit: Payer: Self-pay | Admitting: Internal Medicine

## 2015-02-18 DIAGNOSIS — H4011X1 Primary open-angle glaucoma, mild stage: Secondary | ICD-10-CM | POA: Diagnosis not present

## 2015-02-19 ENCOUNTER — Encounter: Payer: Self-pay | Admitting: Internal Medicine

## 2015-02-19 ENCOUNTER — Ambulatory Visit (INDEPENDENT_AMBULATORY_CARE_PROVIDER_SITE_OTHER): Payer: Medicare Other | Admitting: Internal Medicine

## 2015-02-19 VITALS — BP 128/68 | HR 62 | Temp 97.8°F | Resp 20 | Ht 64.0 in | Wt 230.8 lb

## 2015-02-19 DIAGNOSIS — R600 Localized edema: Secondary | ICD-10-CM

## 2015-02-19 DIAGNOSIS — E1165 Type 2 diabetes mellitus with hyperglycemia: Secondary | ICD-10-CM

## 2015-02-19 DIAGNOSIS — E1129 Type 2 diabetes mellitus with other diabetic kidney complication: Secondary | ICD-10-CM

## 2015-02-19 DIAGNOSIS — F32A Depression, unspecified: Secondary | ICD-10-CM

## 2015-02-19 DIAGNOSIS — I10 Essential (primary) hypertension: Secondary | ICD-10-CM | POA: Diagnosis not present

## 2015-02-19 DIAGNOSIS — Z23 Encounter for immunization: Secondary | ICD-10-CM

## 2015-02-19 DIAGNOSIS — K5901 Slow transit constipation: Secondary | ICD-10-CM | POA: Diagnosis not present

## 2015-02-19 DIAGNOSIS — G990 Autonomic neuropathy in diseases classified elsewhere: Secondary | ICD-10-CM | POA: Diagnosis not present

## 2015-02-19 DIAGNOSIS — E1140 Type 2 diabetes mellitus with diabetic neuropathy, unspecified: Secondary | ICD-10-CM | POA: Diagnosis not present

## 2015-02-19 DIAGNOSIS — K219 Gastro-esophageal reflux disease without esophagitis: Secondary | ICD-10-CM | POA: Diagnosis not present

## 2015-02-19 DIAGNOSIS — IMO0002 Reserved for concepts with insufficient information to code with codable children: Secondary | ICD-10-CM

## 2015-02-19 DIAGNOSIS — F329 Major depressive disorder, single episode, unspecified: Secondary | ICD-10-CM

## 2015-02-19 DIAGNOSIS — E1143 Type 2 diabetes mellitus with diabetic autonomic (poly)neuropathy: Secondary | ICD-10-CM | POA: Diagnosis not present

## 2015-02-19 MED ORDER — DULOXETINE HCL 30 MG PO CPEP: ORAL_CAPSULE | ORAL | Status: DC

## 2015-02-19 MED ORDER — ESOMEPRAZOLE MAGNESIUM 40 MG PO CPDR: DELAYED_RELEASE_CAPSULE | ORAL | Status: DC

## 2015-02-19 MED ORDER — TORSEMIDE 20 MG PO TABS: ORAL_TABLET | ORAL | Status: DC

## 2015-02-19 MED ORDER — DULOXETINE HCL 30 MG PO CPEP
ORAL_CAPSULE | ORAL | Status: DC
Start: 2015-02-19 — End: 2015-02-19

## 2015-02-19 NOTE — Progress Notes (Signed)
Patient ID: Michelle Buck, female   DOB: Jan 06, 1936, 79 y.o.   MRN: [id number]    Chief Complaint  Patient presents with  . Medical Management of Chronic Issues    3 month follow up   Allergies  Allergen Reactions  . Tramadol Other (See Comments)    Leg cramps   . Codeine Nausea And Vomiting    Patient states N/V with codeine  . Hydrocodone Nausea And Vomiting  . Oxycodone Other (See Comments)    Patient states she can tolerate oxycodone  . Tylenol [Acetaminophen] Hives  . Penicillins Rash    Patient states rash/itch with penicillin   HPI  Patient here for routine follow up. She denies any concerns this visit  DM- cbg this am107. cb g range on review 105-190 with some reading of 201-224 and one of 84. Taking humulin 70/30 40 u in am and 20 u at night. Does exercise sitting on her chair for legs and upper arms. Tries to eat healthy  Depression- cymbalta and zoloft has been helping her mood and myalgia. Needs refill on this  HTN- Taking her bp medication as prescribed. bp controlled- on clonidine, enalapril  Constipation- linzess has been helpful  Chronic back pain- under moderate control with norco current regimen. No falls reported. Using her walker  gerd- nexium bid has been helpful, careful with her meals.    Review of Systems  Constitutional: Negative for fever, chills, diaphoresis.  HENT: Negative for congestion Eyes: Negative for blurred vision, double vision and discharge. Wears glasses Respiratory: Negative for cough, wheezing.  has dyspnea on exertion Cardiovascular: Negative for chest pain, palpitations. leg swelling has improved a lot Gastrointestinal: Negative for nausea, vomiting, abdominal pain  Genitourinary: Negative for dysuria  Musculoskeletal: Negative for falls. Uses walker. Has chronic back pain Skin: Negative for itching and rash.  Neurological: Negative for dizziness. occassional headaches  Past Medical History  Diagnosis Date  . Diabetes  mellitus   . Hypertension   . Obese   . Hiatal hernia   . Scoliosis   . Arthritis   . Acute bronchitis   . COPD (chronic obstructive pulmonary disease)   . Unspecified hereditary and idiopathic peripheral neuropathy   . Secondary diabetes mellitus with renal manifestations, not stated as uncontrolled, or unspecified   . Chronic kidney disease, stage II (mild)   . Chronic kidney disease, stage II (mild)   . Chronic kidney disease, unspecified   . Disorder of bone and cartilage, unspecified   . Complications affecting other specified body systems, hypertension   . Hypopotassemia   . Anxiety   . Congestive heart failure, unspecified   . GERD (gastroesophageal reflux disease)   . Lumbago   . Insomnia, unspecified   . Hyperlipidemia   . Unspecified glaucoma   . Diaphragmatic hernia without mention of obstruction or gangrene   . Osteoarthrosis, unspecified whether generalized or localized, unspecified site    Current Outpatient Prescriptions on File Prior to Visit  Medication Sig Dispense Refill  . AMITIZA 8 MCG capsule TAKE 1 CAPSULE BY MOUTH TWICE DAILY WITH A MEAL 60 capsule 4  . aspirin 81 MG tablet Take 81 mg by mouth daily.      . cilostazol (PLETAL) 100 MG tablet TAKE 1 TABLET BY MOUTH EVERY DAY 30 tablet 4  . cloNIDine (CATAPRES) 0.1 MG tablet TAKE 1 TABLET BY MOUTH EVERY DAY 30 tablet 5  . cycloSPORINE (RESTASIS) 0.05 % ophthalmic emulsion 1 drop 2 (two) times daily. One drop once a  day bilaterally for dry eyes    . DULoxetine (CYMBALTA) 30 MG capsule One daily to help anxiety and pains 30 capsule 3  . EASY TOUCH INSULIN SYRINGE 31G X 5/16" 0.5 ML MISC USE UP TO 2 TIMES A DAY AS DIRECTED 10 each 4  . enalapril (VASOTEC) 10 MG tablet One daily to control BP and to strengthen the heart 30 tablet 5  . Fluticasone-Salmeterol (ADVAIR DISKUS) 250-50 MCG/DOSE AEPB Inhale 1 puff into the lungs 2 (two) times daily. 60 each 3  . guaifenesin (ROBITUSSIN) 100 MG/5ML syrup Take 5-10 mLs  (100-200 mg total) by mouth every 4 (four) hours as needed for cough. 60 mL 0  . hydrALAZINE (APRESOLINE) 50 MG tablet Take 1 tablet (50 mg total) by mouth 2 (two) times daily. 90 tablet 3  . HYDROcodone-acetaminophen (NORCO) 10-325 MG per tablet 1 tablet by mouth every 8 hours as needed for moderate to severe pain    . insulin NPH-regular Human (HUMULIN 70/30) (70-30) 100 UNIT/ML injection Inject 40 units subcutaneously with breakfast and 20 units with dinner to control blood sugar. 20 mL 5  . meloxicam (MOBIC) 15 MG tablet TAKE 1 TABLET BY MOUTH EVERY DAY 30 tablet 5  . metoprolol succinate (TOPROL-XL) 25 MG 24 hr tablet TAKE 1 TABLET BY MOUTH EVERY DAY WITH OR IMMEDIATELY FOLLOWING A MEAL FOR BLOOD PRESSURE 30 tablet 5  . NEXIUM 40 MG capsule TAKE 1 CAPSULE BY MOUTH TWICE DAILY FOR HEARTBURN 60 capsule 5  . potassium chloride SA (K-DUR,KLOR-CON) 20 MEQ tablet TAKE 1 TABLET BY MOUTH EVERY DAY WITH FOOD 30 tablet 4  . sertraline (ZOLOFT) 50 MG tablet TAKE 1 TABLET BY MOUTH DAILY 30 tablet 5  . simvastatin (ZOCOR) 10 MG tablet Take 10 mg by mouth every evening.    . sucralfate (CARAFATE) 1 G tablet Take 1 g by mouth 2 (two) times daily.    . timolol (TIMOPTIC) 0.5 % ophthalmic solution Place 1 drop into both eyes 2 (two) times daily. One drop in bilaterally twice a day as needed    . torsemide (DEMADEX) 20 MG tablet Take 1 tablet in morning and 1 tablet after lunch to help with leg edema 90 tablet 3  . Travoprost, BAK Free, (TRAVATAN) 0.004 % SOLN ophthalmic solution Place 1 drop into both eyes at bedtime. One drop bilaterally twice a day    . VENTOLIN HFA 108 (90 BASE) MCG/ACT inhaler INHALE 1 PUFF 4 TIMES A DAY FOR 5 DAYS, THEN USE EVERY 6 HOURS AS NEEDED 18 each 5  . VOLTAREN 1 % GEL APPLY 4 GRAM TOPICALLY TWICE DAILY 100 g 4   No current facility-administered medications on file prior to visit.    Physical exam BP 128/68 mmHg  Pulse 62  Temp(Src) 97.8 F (36.6 C) (Oral)  Resp 20  Ht 5'  4" (1.626 m)  Wt 230 lb 12.8 oz (104.69 kg)  BMI 39.60 kg/m2  SpO2 91%  Wt Readings from Last 3 Encounters:  02/19/15 230 lb 12.8 oz (104.69 kg)  01/30/15 232 lb 12.8 oz (105.597 kg)  12/17/14 223 lb (101.152 kg)   General- elderly female in no acute distress, morbidly obese Head- atraumatic, normocephalic Eyes- PERRLA, EOMI, no pallor, no icterus, no discharge Neck- no lymphadenopathy Cardiovascular- normal s1,s2, no murmurs, has trace leg edema Respiratory- bilateral poor air entry, no wheeze, no rhonchi, no crackles Abdomen- bowel sounds present, soft, non tender Musculoskeletal- limited ROM, cautious small steps, using rollator walker  Neurological- normal sensation  to fine touch and vibration Skin- warm and dry.  Psychiatry- alert and oriented to person, place and time, normal mood and affect   Labs  CBC Latest Ref Rng 10/15/2014 07/03/2014 05/13/2014  WBC 4.0 - 10.5 K/uL 8.4 6.4 -  Hemoglobin 12.0 - 15.0 g/dL 12.1 11.2 12.2  Hematocrit 36.0 - 46.0 % 37.0 34.2 36.0  Platelets 150 - 400 K/uL 230 263 -   CMP Latest Ref Rng 10/15/2014 10/10/2014 07/13/2014  Glucose 70 - 99 mg/dL 354(H) 211(H) 230(H)  BUN 6 - 23 mg/dL 49(H) 23 17  Creatinine 0.50 - 1.10 mg/dL 1.78(H) 1.70(H) 1.41(H)  Sodium 137 - 147 mEq/L 136(L) 141 141  Potassium 3.7 - 5.3 mEq/L 3.7 3.3(L) 3.7  Chloride 96 - 112 mEq/L 92(L) 96(L) 96(L)  CO2 19 - 32 mEq/L 28 28 30(H)  Calcium 8.4 - 10.5 mg/dL 9.5 9.3 9.5  Total Protein 6.0 - 8.3 g/dL 8.0 6.8 -  Albumin 3.5 - 4.8 g/dL - 3.9 -  Total Bilirubin 0.3 - 1.2 mg/dL 0.3 <0.2 -  Alkaline Phos 39 - 117 U/L 101 102 -  AST 0 - 37 U/L 14 13 -  ALT 0 - 35 U/L 7 6 -   Lab Results  Component Value Date   HGBA1C 9.7* 11/20/2014   Lipid Panel     Component Value Date/Time   CHOL 143 06/11/2014 0926   CHOL 144 05/14/2010 2347   TRIG 145 06/11/2014 0926   HDL 58 06/11/2014 0926   HDL 48 05/14/2010 2347   CHOLHDL 2.5 06/11/2014 0926   CHOLHDL 3.0 Ratio 05/14/2010  2347   VLDL 40 05/14/2010 2347   LDLCALC 56 06/11/2014 0926   LDLCALC 56 05/14/2010 2347    Assessment/plan  1. Diabetic autonomic neuropathy associated with type 2 diabetes mellitus Reviewed recent a1c from 12/16. Recheck a1c today. Continue humulin 40 in am and 20 in pm, monitor cbg. Continue aspirin and enalapril with cymbalta - Lipid Panel - Hemoglobin 123456 - Basic Metabolic Panel - DULoxetine (CYMBALTA) 30 MG capsule; One daily to help anxiety and pains  Dispense: 30 capsule; Refill: 3  2. Bilateral edema of lower extremity Improved, change demadex to 20 mg daily - torsemide (DEMADEX) 20 MG tablet; Take 1 tablet in morning only to help with leg edema  Dispense: 90 tablet; Refill: 3  3. DM type 2, uncontrolled, with renal complications See above. Continue zocor. prevnar provided today. uptodate with foot exam. Due for eye exam- pt to make appointment  4. Slow transit constipation Continue linzess  5. Essential hypertension, benign Stable on hydralazine 50 mg bid, clonidine 0.1 daily, enalapril 10 mg daily and toprol xl 25 mg daily, continue this and monitor bp readings  6. Depression Continue sertraline and cymbalta  7. Gastroesophageal reflux disease without esophagitis Change nexium to 40 mg daily for now with the evening dosing changed to prn , reassess

## 2015-02-20 LAB — BASIC METABOLIC PANEL
BUN / CREAT RATIO: 16 (ref 11–26)
BUN: 38 mg/dL — ABNORMAL HIGH (ref 8–27)
CO2: 24 mmol/L (ref 18–29)
Calcium: 9.5 mg/dL (ref 8.7–10.3)
Chloride: 101 mmol/L (ref 97–108)
Creatinine, Ser: 2.34 mg/dL — ABNORMAL HIGH (ref 0.57–1.00)
GFR calc Af Amer: 23 mL/min/{1.73_m2} — ABNORMAL LOW (ref >59)
GFR calc non Af Amer: 20 mL/min/{1.73_m2} — ABNORMAL LOW (ref >59)
Glucose: 166 mg/dL — ABNORMAL HIGH (ref 65–99)
Potassium: 4.1 mmol/L (ref 3.5–5.2)
Sodium: 143 mmol/L (ref 134–144)

## 2015-02-20 LAB — LIPID PANEL
CHOL/HDL RATIO: 4 ratio (ref 0.0–4.4)
Cholesterol, Total: 174 mg/dL (ref 100–199)
HDL: 44 mg/dL (ref >39)
LDL Calculated: 75 mg/dL (ref 0–99)
Triglycerides: 275 mg/dL — ABNORMAL HIGH (ref 0–149)
VLDL CHOLESTEROL CAL: 55 mg/dL — AB (ref 5–40)

## 2015-02-20 LAB — HEMOGLOBIN A1C
Est. average glucose Bld gHb Est-mCnc: 171 mg/dL
Hgb A1c MFr Bld: 7.6 % — ABNORMAL HIGH (ref 4.8–5.6)

## 2015-02-21 ENCOUNTER — Telehealth: Payer: Self-pay | Admitting: *Deleted

## 2015-02-21 ENCOUNTER — Other Ambulatory Visit: Payer: Self-pay

## 2015-02-21 ENCOUNTER — Telehealth: Payer: Self-pay

## 2015-02-21 DIAGNOSIS — N184 Chronic kidney disease, stage 4 (severe): Secondary | ICD-10-CM

## 2015-02-21 DIAGNOSIS — R600 Localized edema: Secondary | ICD-10-CM

## 2015-02-21 DIAGNOSIS — E1143 Type 2 diabetes mellitus with diabetic autonomic (poly)neuropathy: Secondary | ICD-10-CM

## 2015-02-21 MED ORDER — TORSEMIDE 20 MG PO TABS: ORAL_TABLET | ORAL | Status: DC

## 2015-02-21 MED ORDER — ESOMEPRAZOLE MAGNESIUM 40 MG PO CPDR: DELAYED_RELEASE_CAPSULE | ORAL | Status: DC

## 2015-02-21 MED ORDER — SIMVASTATIN 20 MG PO TABS: 20.0000 mg | ORAL_TABLET | Freq: Every day | ORAL | Status: DC

## 2015-02-21 MED ORDER — DULOXETINE HCL 30 MG PO CPEP: ORAL_CAPSULE | ORAL | Status: DC

## 2015-02-21 NOTE — Telephone Encounter (Signed)
Received an email from [place] [employer] stating [place] has Approved Nexium

## 2015-02-21 NOTE — Telephone Encounter (Signed)
Discussed results with patient, patient verbalized understanding of results. Referral order was placed for Nephrologist. RX for increased dose of simvastatin sent to [place], patient taking current dose 10 mg as prescribed.   Patient then mentioned that her medications were sent to the wrong pharmacy when  She was here. I called [facility] and was told to have patient call to cancel order.

## 2015-02-21 NOTE — Telephone Encounter (Signed)
-----   Message from Blanchie Serve, MD sent at 02/20/2015  6:16 PM EDT ----- Your bad cholesterol has worsened. Are you taking your simvastatin? If yes, then will need to further increase it to 20 mg daily for now. Your sugar is better controlled than before. Take current medication. Your kidney function has worsened and this is likely from your diabetes and HTN.  Has she been seen by renal? If not, will need renal referral for worsening renal function- diagnosis code: ckd stage 4- evaluate further.

## 2015-02-21 NOTE — Telephone Encounter (Signed)
Received an email from [place] [employer] stating [place] has received a PA determination from insurance. Has been Approved

## 2015-02-26 ENCOUNTER — Ambulatory Visit: Payer: Medicare Other | Admitting: *Deleted

## 2015-02-28 ENCOUNTER — Other Ambulatory Visit: Payer: Self-pay | Admitting: Internal Medicine

## 2015-03-06 ENCOUNTER — Other Ambulatory Visit: Payer: Self-pay | Admitting: Internal Medicine

## 2015-03-08 DIAGNOSIS — S8392XA Sprain of unspecified site of left knee, initial encounter: Secondary | ICD-10-CM | POA: Diagnosis not present

## 2015-03-14 ENCOUNTER — Other Ambulatory Visit: Payer: Medicare Other

## 2015-03-14 DIAGNOSIS — E1122 Type 2 diabetes mellitus with diabetic chronic kidney disease: Secondary | ICD-10-CM | POA: Diagnosis not present

## 2015-03-14 DIAGNOSIS — N189 Chronic kidney disease, unspecified: Secondary | ICD-10-CM | POA: Diagnosis not present

## 2015-03-14 DIAGNOSIS — E1129 Type 2 diabetes mellitus with other diabetic kidney complication: Secondary | ICD-10-CM | POA: Diagnosis not present

## 2015-03-15 LAB — COMPREHENSIVE METABOLIC PANEL
ALT: 9 IU/L (ref 0–32)
AST: 20 IU/L (ref 0–40)
Albumin/Globulin Ratio: 1.3 (ref 1.1–2.5)
Albumin: 3.8 g/dL (ref 3.5–4.8)
Alkaline Phosphatase: 98 IU/L (ref 39–117)
BUN/Creatinine Ratio: 19 (ref 11–26)
BUN: 34 mg/dL — ABNORMAL HIGH (ref 8–27)
Bilirubin Total: <0.2 mg/dL (ref 0.0–1.2)
CO2: 24 mmol/L (ref 18–29)
Calcium: 9.1 mg/dL (ref 8.7–10.3)
Chloride: 102 mmol/L (ref 97–108)
Creatinine, Ser: 1.8 mg/dL — ABNORMAL HIGH (ref 0.57–1.00)
GFR calc Af Amer: 31 mL/min/{1.73_m2} — ABNORMAL LOW (ref >59)
GFR calc non Af Amer: 27 mL/min/{1.73_m2} — ABNORMAL LOW (ref >59)
Globulin, Total: 2.9 g/dL (ref 1.5–4.5)
Glucose: 137 mg/dL — ABNORMAL HIGH (ref 65–99)
Potassium: 4.5 mmol/L (ref 3.5–5.2)
Sodium: 143 mmol/L (ref 134–144)
Total Protein: 6.7 g/dL (ref 6.0–8.5)

## 2015-03-15 LAB — LIPID PANEL
Chol/HDL Ratio: 3.6 ratio units (ref 0.0–4.4)
Cholesterol, Total: 147 mg/dL (ref 100–199)
HDL: 41 mg/dL (ref >39)
LDL Calculated: 62 mg/dL (ref 0–99)
Triglycerides: 219 mg/dL — ABNORMAL HIGH (ref 0–149)
VLDL Cholesterol Cal: 44 mg/dL — ABNORMAL HIGH (ref 5–40)

## 2015-03-15 LAB — HEMOGLOBIN A1C
Est. average glucose Bld gHb Est-mCnc: 163 mg/dL
Hgb A1c MFr Bld: 7.3 % — ABNORMAL HIGH (ref 4.8–5.6)

## 2015-03-18 ENCOUNTER — Encounter: Payer: Self-pay | Admitting: Pharmacotherapy

## 2015-03-18 ENCOUNTER — Ambulatory Visit (INDEPENDENT_AMBULATORY_CARE_PROVIDER_SITE_OTHER): Payer: Medicare Other | Admitting: Pharmacotherapy

## 2015-03-18 VITALS — BP 138/80 | HR 53 | Temp 97.1°F | Resp 22 | Ht 64.0 in | Wt 235.2 lb

## 2015-03-18 DIAGNOSIS — E1165 Type 2 diabetes mellitus with hyperglycemia: Secondary | ICD-10-CM | POA: Diagnosis not present

## 2015-03-18 DIAGNOSIS — I10 Essential (primary) hypertension: Secondary | ICD-10-CM | POA: Diagnosis not present

## 2015-03-18 DIAGNOSIS — IMO0002 Reserved for concepts with insufficient information to code with codable children: Secondary | ICD-10-CM

## 2015-03-18 DIAGNOSIS — E1129 Type 2 diabetes mellitus with other diabetic kidney complication: Secondary | ICD-10-CM

## 2015-03-18 NOTE — Patient Instructions (Signed)
Needs to increase exercise 

## 2015-03-18 NOTE — Progress Notes (Signed)
  Subjective:    Michelle Buck is a 79 y.o.African American female who presents for follow-up of Type 2 diabetes mellitus.   She "wants something for my nerves".  Says she is anxious all the time.  She is scared to get out of bed because she is afraid she will not feel better.   Has been constipated. A1C 7.6%  Forgot her meter and logbook. Denies hypoglycemia. Lowest BG in the 90's. Does have peripheral edema. Her feet hurt.  No sores. More blurry vision.  Needs to wear her glasses. Nocturia 4-5 times per night.  Continues 40 units breakfast / 20 units supper (70/30 premixed insulin)  No exercise.  Limited due to back pain. Eats a lot of peanut butter sandwiches. Having muscle cramps.    Review of Systems A comprehensive review of systems was negative except for: Eyes: positive for blurry vision   See HPI   Objective:    BP 138/80 mmHg  Pulse 53  Temp(Src) 97.1 F (36.2 C) (Oral)  Resp 22  Ht 5\' 4"  (1.626 m)  Wt 235 lb 3.2 oz (106.686 kg)  BMI 40.35 kg/m2  SpO2 96%  General:  alert, cooperative and mild distress  Oropharynx: normal findings: lips normal without lesions and gums healthy   Eyes:  negative findings: lids and lashes normal and conjunctivae and sclerae normal   Ears:  external ears normal        Lung: clear to auscultation bilaterally  Heart:  regular rate and rhythm     Extremities: edema trace bilateral lower extremitites  Skin: dry     Neuro: mental status, speech normal, alert and oriented x3   Lab Review GLUCOSE (mg/dL)  Date Value  03/14/2015 137*  02/19/2015 166*  10/10/2014 211*   GLUCOSE, BLD (mg/dL)  Date Value  10/15/2014 354*  05/13/2014 173*  03/01/2012 212*   CO2  Date Value  03/14/2015 24 mmol/L  02/19/2015 24 mmol/L  10/15/2014 28 mEq/L   BUN (mg/dL)  Date Value  03/14/2015 34*  02/19/2015 38*  10/15/2014 49*  10/10/2014 23  05/13/2014 18  03/01/2012 22   CREATININE, SER (mg/dL)  Date Value  03/14/2015 1.80*   02/19/2015 2.34*  10/15/2014 1.78*       Assessment:    Diabetes Mellitus type II, under fair control.   BP at goal <140/90 Complains of anxiety   Plan:    1.  Rx changes: none  2.  Continue 70/30 Premixed insulin 40 units with breakfast, 20 units with supper. 3.  Counseled on nutrition goals. 4.  Counseled on need for routine exercise.  Goal is 30-45 minutes 5 x week. 5.  BP at goal. 6.  Complaining of anxiety - will let [name], NP know to see if more follow up indicated.

## 2015-03-25 DIAGNOSIS — N140 Analgesic nephropathy: Secondary | ICD-10-CM | POA: Diagnosis not present

## 2015-03-25 DIAGNOSIS — I129 Hypertensive chronic kidney disease with stage 1 through stage 4 chronic kidney disease, or unspecified chronic kidney disease: Secondary | ICD-10-CM | POA: Diagnosis not present

## 2015-03-25 DIAGNOSIS — N183 Chronic kidney disease, stage 3 (moderate): Secondary | ICD-10-CM | POA: Diagnosis not present

## 2015-03-26 ENCOUNTER — Other Ambulatory Visit: Payer: Self-pay | Admitting: Internal Medicine

## 2015-04-03 ENCOUNTER — Other Ambulatory Visit: Payer: Self-pay | Admitting: Internal Medicine

## 2015-04-10 ENCOUNTER — Ambulatory Visit (INDEPENDENT_AMBULATORY_CARE_PROVIDER_SITE_OTHER): Payer: Medicare Other | Admitting: Podiatry

## 2015-04-10 ENCOUNTER — Encounter: Payer: Self-pay | Admitting: Podiatry

## 2015-04-10 VITALS — BP 121/73 | HR 60 | Resp 18

## 2015-04-10 DIAGNOSIS — B351 Tinea unguium: Secondary | ICD-10-CM | POA: Diagnosis not present

## 2015-04-10 DIAGNOSIS — M2040 Other hammer toe(s) (acquired), unspecified foot: Secondary | ICD-10-CM

## 2015-04-10 DIAGNOSIS — E1149 Type 2 diabetes mellitus with other diabetic neurological complication: Secondary | ICD-10-CM

## 2015-04-10 DIAGNOSIS — R52 Pain, unspecified: Secondary | ICD-10-CM

## 2015-04-10 DIAGNOSIS — E1143 Type 2 diabetes mellitus with diabetic autonomic (poly)neuropathy: Secondary | ICD-10-CM

## 2015-04-10 DIAGNOSIS — E1140 Type 2 diabetes mellitus with diabetic neuropathy, unspecified: Secondary | ICD-10-CM

## 2015-04-10 DIAGNOSIS — M19079 Primary osteoarthritis, unspecified ankle and foot: Secondary | ICD-10-CM

## 2015-04-10 NOTE — Patient Instructions (Signed)

## 2015-04-10 NOTE — Progress Notes (Signed)
Patient ID: Michelle Buck, female   DOB: Jul 14, 1936, 79 y.o.   MRN: [id number]  Subjective: 79 y.o.-year-old felame returns the office today for painful, elongated, thickened toenails and to pick up diabetic shoes. Denies any redness or drainage around the nails. Denies any acute changes since last appointment and no new complaints today. Denies any systemic complaints such as fevers, chills, nausea, vomiting.   Objective: AAO 3, NAD DP/PT pulses palpable, CRT less than 3 seconds Protective sensation decreased with Simms Weinstein monofilament, Achilles tendon reflex intact.  Nails hypertrophic, dystrophic, elongated, brittle, discolored 10. There is tenderness overlying these nails. There is no surrounding erythema or drainage along the nail sites. No open lesions or pre-ulcerative lesions are identified. No other areas of tenderness bilateral lower extremities. No overlying edema, erythema, increased warmth. No pain with calf compression, swelling, warmth, erythema.  Assessment: Patient presents with symptomatic onychomycosis  Plan: -Treatment options including alternatives, risks, complications were discussed -Nails sharply debrided 10 without complication/bleeding. -Discussed daily foot inspection. If there are any changes, to call the office immediately.  -Follow-up in 3 months or sooner if any problems are to arise. In the meantime, encouraged to call the office with any questions, concerns, changes symptoms.   PATIENT PRESENTS FOR NAIL CARE AND DIABETIC SHOE PICK UP.  SHOES ARE TRIED ON FOR GOOD FIT.  PATIENT RECEIVED 1 PAIR APEX ATHLETIC WALKER DOUBLE STRAP IN WHITE WOMEN'S SIZE 10.5 WIDE WITH 3 PAIRS CUSTOM DIABETIC INSERTS. WRITTEN BREAK IN AND WEAR INSTURCTIONS GIVEN

## 2015-04-17 ENCOUNTER — Other Ambulatory Visit: Payer: Self-pay | Admitting: Internal Medicine

## 2015-05-01 ENCOUNTER — Other Ambulatory Visit: Payer: Self-pay | Admitting: Internal Medicine

## 2015-05-10 DIAGNOSIS — M179 Osteoarthritis of knee, unspecified: Secondary | ICD-10-CM | POA: Diagnosis not present

## 2015-05-23 ENCOUNTER — Ambulatory Visit: Payer: Medicare Other | Admitting: Nurse Practitioner

## 2015-05-27 DIAGNOSIS — H4011X1 Primary open-angle glaucoma, mild stage: Secondary | ICD-10-CM | POA: Diagnosis not present

## 2015-05-27 DIAGNOSIS — E119 Type 2 diabetes mellitus without complications: Secondary | ICD-10-CM | POA: Diagnosis not present

## 2015-05-27 DIAGNOSIS — Z961 Presence of intraocular lens: Secondary | ICD-10-CM | POA: Diagnosis not present

## 2015-05-27 LAB — HM DIABETES EYE EXAM

## 2015-05-28 ENCOUNTER — Encounter: Payer: Self-pay | Admitting: *Deleted

## 2015-05-28 ENCOUNTER — Other Ambulatory Visit: Payer: Self-pay | Admitting: Internal Medicine

## 2015-05-30 ENCOUNTER — Ambulatory Visit (INDEPENDENT_AMBULATORY_CARE_PROVIDER_SITE_OTHER): Payer: Medicare Other | Admitting: Nurse Practitioner

## 2015-05-30 VITALS — BP 130/78 | HR 60 | Temp 97.8°F | Resp 22 | Ht 64.0 in | Wt 237.8 lb

## 2015-05-30 DIAGNOSIS — M542 Cervicalgia: Secondary | ICD-10-CM

## 2015-05-30 DIAGNOSIS — N189 Chronic kidney disease, unspecified: Secondary | ICD-10-CM | POA: Diagnosis not present

## 2015-05-30 DIAGNOSIS — I10 Essential (primary) hypertension: Secondary | ICD-10-CM

## 2015-05-30 DIAGNOSIS — R600 Localized edema: Secondary | ICD-10-CM | POA: Diagnosis not present

## 2015-05-30 DIAGNOSIS — K5900 Constipation, unspecified: Secondary | ICD-10-CM

## 2015-05-30 DIAGNOSIS — F411 Generalized anxiety disorder: Secondary | ICD-10-CM

## 2015-05-30 DIAGNOSIS — N183 Chronic kidney disease, stage 3 (moderate): Secondary | ICD-10-CM | POA: Diagnosis not present

## 2015-05-30 DIAGNOSIS — E1122 Type 2 diabetes mellitus with diabetic chronic kidney disease: Secondary | ICD-10-CM

## 2015-05-30 DIAGNOSIS — G8929 Other chronic pain: Secondary | ICD-10-CM | POA: Diagnosis not present

## 2015-05-30 MED ORDER — GABAPENTIN 100 MG PO CAPS: 200.0000 mg | ORAL_CAPSULE | Freq: Three times a day (TID) | ORAL | Status: DC

## 2015-05-30 MED ORDER — LUBIPROSTONE 24 MCG PO CAPS: 24.0000 ug | ORAL_CAPSULE | Freq: Two times a day (BID) | ORAL | Status: DC

## 2015-05-30 NOTE — Progress Notes (Signed)
Patient ID: Michelle Buck, female   DOB: January 08, 1936, 79 y.o.   MRN: [id number]    PCP: [name], NP  Allergies  Allergen Reactions  . Tramadol Other (See Comments)    Leg cramps   . Codeine Nausea And Vomiting    Patient states N/V with codeine  . Hydrocodone Nausea And Vomiting  . Oxycodone Other (See Comments)    Patient states she can tolerate oxycodone  . Tylenol [Acetaminophen] Hives  . Penicillins Rash    Patient states rash/itch with penicillin    Chief Complaint  Patient presents with  . Medical Management of Chronic Issues    body hurts all over, having cramps in legs and hands,     HPI: Patient is a 79 y.o. female seen in the office today for routine follow up.  No new concerns, having a headaches over the last month, used to have them years ago and they went away. Now she she has been having them on and off. Not taking anything specially for headaches. Increased  pain in back of her neck and back, hx of back problems. Pt follows with orthopedic due to DDD, taking hydrocodone per orthopedic. Also having numbness and tingling in hands and fingers. Neck pain with numbness and tingling has been going over for the past month or so. Increased cramps, taking mustard which helps.  Taking amitiza for bowels, sometimes she has to take 2 to help her go.  conts on zoloft and cymbalta for mood.   Takes blood pressures at home but does not write them down.  Does not feel like they are high.   Review of Systems:  Review of Systems  Constitutional: Negative for fever, chills, diaphoresis, appetite change and unexpected weight change.  HENT: Negative for congestion, ear discharge and ear pain.   Eyes: Negative for visual disturbance.  Respiratory: Negative for cough, shortness of breath and wheezing.   Cardiovascular: Negative for chest pain and palpitations.  Genitourinary: Negative for dysuria.  Musculoskeletal: Positive for back pain, arthralgias and neck pain.  Skin:  Negative for color change, rash and wound.  Neurological: Positive for numbness (to hands) and headaches. Negative for dizziness, facial asymmetry and light-headedness.  Hematological: Negative for adenopathy.  Psychiatric/Behavioral: Negative for behavioral problems, dysphoric mood and agitation. The patient is nervous/anxious (and depression).     Past Medical History  Diagnosis Date  . Diabetes mellitus   . Hypertension   . Obese   . Hiatal hernia   . Scoliosis   . Arthritis   . Acute bronchitis   . COPD (chronic obstructive pulmonary disease)   . Unspecified hereditary and idiopathic peripheral neuropathy   . Secondary diabetes mellitus with renal manifestations, not stated as uncontrolled, or unspecified   . Chronic kidney disease, stage II (mild)   . Chronic kidney disease, stage II (mild)   . Chronic kidney disease, unspecified   . Disorder of bone and cartilage, unspecified   . Complications affecting other specified body systems, hypertension   . Hypopotassemia   . Anxiety   . Congestive heart failure, unspecified   . GERD (gastroesophageal reflux disease)   . Lumbago   . Insomnia, unspecified   . Hyperlipidemia   . Unspecified glaucoma   . Diaphragmatic hernia without mention of obstruction or gangrene   . Osteoarthrosis, unspecified whether generalized or localized, unspecified site    Past Surgical History  Procedure Laterality Date  . Abdominal hysterectomy    . Shoulder surgery    .  Total hip arthroplasty      bilateral  . Knee arthroscopy      bilateral  . Colonoscopy  08/06/2011  . Foot surgery     Social History:   reports that she quit smoking about 18 years ago. Her smoking use included Cigarettes. She has never used smokeless tobacco. She reports that she does not drink alcohol or use illicit drugs.  Family History  Problem Relation Age of Onset  . Diabetes Mother   . Heart disease Mother   . Heart disease Brother   . Diabetes Brother   . Kidney  disease Father   . Scoliosis Sister   . Diabetes Brother   . Diabetes Brother   . Heart disease Brother   . Scoliosis Brother   . Stroke Brother   . Heart attack Brother     Medications: Patient's Medications  New Prescriptions   No medications on file  Previous Medications   ADVAIR DISKUS 250-50 MCG/DOSE AEPB    INHALE 1 PUFF BY MOUTH INTO THE LUNGS TWICE DAILY   AMITIZA 8 MCG CAPSULE    TAKE 1 CAPSULE BY MOUTH TWICE DAILY WITH A MEAL   ASPIRIN 81 MG TABLET    Take 81 mg by mouth daily.     CILOSTAZOL (PLETAL) 100 MG TABLET    TAKE 1 TABLET BY MOUTH EVERY DAY   CLONIDINE (CATAPRES) 0.1 MG TABLET    TAKE 1 TABLET BY MOUTH EVERY DAY   CLONIDINE (CATAPRES) 0.1 MG TABLET    TAKE 1 TABLET BY MOUTH EVERY DAY   CYCLOSPORINE (RESTASIS) 0.05 % OPHTHALMIC EMULSION    1 drop 2 (two) times daily. One drop once a day bilaterally for dry eyes   DULOXETINE (CYMBALTA) 30 MG CAPSULE    One daily to help anxiety and pains   EASY TOUCH INSULIN SYRINGE 31G X 5/16" 0.5 ML MISC    USE UP TO 2 TIMES A DAY AS DIRECTED   ENALAPRIL (VASOTEC) 10 MG TABLET    One daily to control BP and to strengthen the heart   ESOMEPRAZOLE (NEXIUM) 40 MG CAPSULE    Take one table in the morning empty stomach daily and one in the evening only if needed for heartburn   ETODOLAC (LODINE) 400 MG TABLET    Take 400 mg by mouth 2 (two) times daily.   GABAPENTIN (NEURONTIN) 100 MG CAPSULE    TAKE 1 CAPSULE BY MOUTH THREE TIMES A DAY   GUAIFENESIN (ROBITUSSIN) 100 MG/5ML SYRUP    Take 5-10 mLs (100-200 mg total) by mouth every 4 (four) hours as needed for cough.   HYDRALAZINE (APRESOLINE) 50 MG TABLET    TAKE 1 TABLET BY MOUTH TWICE DAILY   HYDROCODONE-ACETAMINOPHEN (NORCO) 10-325 MG PER TABLET    1 tablet by mouth every 8 hours as needed for moderate to severe pain   INSULIN NPH-REGULAR HUMAN (HUMULIN 70/30) (70-30) 100 UNIT/ML INJECTION    Inject 40 units subcutaneously with breakfast and 20 units with dinner to control blood sugar.    MELOXICAM (MOBIC) 15 MG TABLET    TAKE 1 TABLET BY MOUTH EVERY DAY   METOPROLOL SUCCINATE (TOPROL-XL) 25 MG 24 HR TABLET    TAKE 1 TABLET BY MOUTH EVERY DAY WITH OR IMMEDIATELY FOLLOWING A MEAL FOR BLOOD PRESSURE   POTASSIUM CHLORIDE SA (K-DUR,KLOR-CON) 20 MEQ TABLET    TAKE 1 TABLET BY MOUTH EVERY DAY WITH FOOD   SERTRALINE (ZOLOFT) 50 MG TABLET    TAKE 1 TABLET  BY MOUTH DAILY   SIMVASTATIN (ZOCOR) 20 MG TABLET    Take 1 tablet (20 mg total) by mouth daily.   SUCRALFATE (CARAFATE) 1 G TABLET    Take 1 g by mouth 2 (two) times daily.   SUCRALFATE (CARAFATE) 1 G TABLET    TAKE 1 TABLET BY MOUTH TWICE DAILY ON EMPTY STOMACH   TIMOLOL (TIMOPTIC) 0.5 % OPHTHALMIC SOLUTION    Place 1 drop into both eyes 2 (two) times daily. One drop in bilaterally twice a day as needed   TORSEMIDE (DEMADEX) 20 MG TABLET    Take 1 tablet in morning only to help with leg edema   TRAVOPROST, BAK FREE, (TRAVATAN) 0.004 % SOLN OPHTHALMIC SOLUTION    Place 1 drop into both eyes at bedtime. One drop bilaterally twice a day   VENTOLIN HFA 108 (90 BASE) MCG/ACT INHALER    INHALE 1 PUFF 4 TIMES A DAY FOR 5 DAYS, THEN USE EVERY 6 HOURS AS NEEDED   VOLTAREN 1 % GEL    APPLY 4 GRAM TOPICALLY TWICE DAILY  Modified Medications   No medications on file  Discontinued Medications   No medications on file     Physical Exam:  Filed Vitals:   05/30/15 1012 05/30/15 1106  BP: 150/78 130/78  Pulse: 60   Temp: 97.8 F (36.6 C)   TempSrc: Oral   Resp: 22   Height: 5\' 4"  (1.626 m)   Weight: 237 lb 12.8 oz (107.865 kg)   SpO2: 91%     Physical Exam  Constitutional: She is oriented to person, place, and time. She appears well-developed and well-nourished. No distress.  HENT:  Head: Normocephalic and atraumatic.  Mouth/Throat: Oropharynx is clear and moist. No oropharyngeal exudate.  Eyes: Conjunctivae are normal. Pupils are equal, round, and reactive to light.  Neck: Normal range of motion. Neck supple.  Cardiovascular:  Normal rate, regular rhythm and normal heart sounds.   Pulmonary/Chest: Effort normal and breath sounds normal.  Abdominal: Soft. Bowel sounds are normal.  Musculoskeletal: She exhibits edema (trace). She exhibits no tenderness.  Uses rolling walker Weak grips bilaterally to hands, decreased sensation to fingers and hands   Neurological: She is alert and oriented to person, place, and time.  Skin: Skin is warm and dry. She is not diaphoretic.  Psychiatric: She has a normal mood and affect.    Labs reviewed: Basic Metabolic Panel:  Recent Labs  10/15/14 1438 02/19/15 1057 03/14/15 0806  NA 136* 143 143  K 3.7 4.1 4.5  CL 92* 101 102  CO2 28 24 24   GLUCOSE 354* 166* 137*  BUN 49* 38* 34*  CREATININE 1.78* 2.34* 1.80*  CALCIUM 9.5 9.5 9.1   Liver Function Tests:  Recent Labs  10/10/14 1419 10/15/14 1438 03/14/15 0806  AST 13 14 20   ALT 6 7 9   ALKPHOS 102 101 98  BILITOT <0.2 0.3 <0.2  PROT 6.8 8.0 6.7  ALBUMIN  --  3.5  --    No results for input(s): LIPASE, AMYLASE in the last 8760 hours. No results for input(s): AMMONIA in the last 8760 hours. CBC:  Recent Labs  07/03/14 1056 10/15/14 1438  WBC 6.4 8.4  NEUTROABS  --  6.4  HGB 11.2 12.1  HCT 34.2 37.0  MCV 82 81.5  PLT 263 230   Lipid Panel:  Recent Labs  06/11/14 0926 02/19/15 1057 03/14/15 0806  CHOL 143 174 147  HDL 58 44 41  LDLCALC 56 75 62  TRIG 145 275* 219*  CHOLHDL 2.5 4.0 3.6   TSH: No results for input(s): TSH in the last 8760 hours. A1C: Lab Results  Component Value Date   HGBA1C 7.3* 03/14/2015     Assessment/Plan 1. Constipation, unspecified constipation type Currently on Amitiza 8 mcg bid but not getting adequate control, will increase to Amitiza 24 mcg twice daily.  - lubiprostone (AMITIZA) 24 MCG capsule; Take 1 capsule (24 mcg total) by mouth 2 (two) times daily with a meal.  Dispense: 30 capsule; Refill: 3  2. Essential hypertension, benign Improved on recheck,  will cont current regimen  3. Neck pain  Worsening pain in neck and shoulder now effecting arms and sensation in fingers -mentioned to ortho but no new orders per pt.  -do not see record of this, will obtain ortho notes -may need further imaging of cervical spine if not done or nerve conduction studies  -will increase gabapentin to 200 mg TID   4. CKD (chronic kidney disease), stage 3 (moderate) -will follow up labs at next visit.   5. Chronic pain -following with orthopedic due to chronic pain, DJD, DDD -conts on norco, cymbalta, mobic  6. Bilateral edema of lower extremity - stable, conts on demadex and potassium supplement  7. Generalized anxiety disorder - conts on zoloft and cymbalta   8. Diabetes with CKD -following with [facility] routinely, takes blood sugars at home but did not bring to visit -A1c of 7.3 in April, will follow up A1c at next visit  To obtain ortho records for further evaluation A1c, CMP, CBC at next visit    Madalynne Gutmann K. Harle Battiest  [facility] [phone] 8 am - 5 pm) [phone] (after hours)

## 2015-05-30 NOTE — Patient Instructions (Addendum)
Take blood pressure and record  Will get records from orthopedic office please sign record release incase we need this-- may need additional imaging   INCREASE gabapentin to 2 tablets three times daily due to numbness and tingling  Follow up in 1 month

## 2015-06-25 ENCOUNTER — Other Ambulatory Visit: Payer: Self-pay | Admitting: Nurse Practitioner

## 2015-06-26 ENCOUNTER — Other Ambulatory Visit: Payer: Self-pay | Admitting: Internal Medicine

## 2015-07-02 ENCOUNTER — Ambulatory Visit (INDEPENDENT_AMBULATORY_CARE_PROVIDER_SITE_OTHER): Payer: Medicare Other | Admitting: Nurse Practitioner

## 2015-07-02 ENCOUNTER — Encounter: Payer: Self-pay | Admitting: Nurse Practitioner

## 2015-07-02 VITALS — BP 126/70 | HR 62 | Temp 97.7°F | Ht 64.0 in | Wt 234.0 lb

## 2015-07-02 DIAGNOSIS — N183 Chronic kidney disease, stage 3 (moderate): Secondary | ICD-10-CM | POA: Diagnosis not present

## 2015-07-02 DIAGNOSIS — E1165 Type 2 diabetes mellitus with hyperglycemia: Secondary | ICD-10-CM

## 2015-07-02 DIAGNOSIS — M1990 Unspecified osteoarthritis, unspecified site: Secondary | ICD-10-CM

## 2015-07-02 DIAGNOSIS — E1129 Type 2 diabetes mellitus with other diabetic kidney complication: Secondary | ICD-10-CM | POA: Diagnosis not present

## 2015-07-02 DIAGNOSIS — N189 Chronic kidney disease, unspecified: Secondary | ICD-10-CM | POA: Diagnosis not present

## 2015-07-02 DIAGNOSIS — E1122 Type 2 diabetes mellitus with diabetic chronic kidney disease: Secondary | ICD-10-CM | POA: Diagnosis not present

## 2015-07-02 DIAGNOSIS — K219 Gastro-esophageal reflux disease without esophagitis: Secondary | ICD-10-CM | POA: Diagnosis not present

## 2015-07-02 DIAGNOSIS — IMO0002 Reserved for concepts with insufficient information to code with codable children: Secondary | ICD-10-CM

## 2015-07-02 DIAGNOSIS — J449 Chronic obstructive pulmonary disease, unspecified: Secondary | ICD-10-CM | POA: Diagnosis not present

## 2015-07-02 DIAGNOSIS — I10 Essential (primary) hypertension: Secondary | ICD-10-CM

## 2015-07-02 MED ORDER — PANTOPRAZOLE SODIUM 40 MG PO TBEC: 40.0000 mg | DELAYED_RELEASE_TABLET | Freq: Every day | ORAL | Status: DC

## 2015-07-02 NOTE — Patient Instructions (Addendum)
Will start Protonix 40 mg daily, this should help cough    Cardiac Diet This diet can help prevent heart disease and stroke. Many factors influence your heart health, including eating and exercise habits. Coronary risk rises a lot with abnormal blood fat (lipid) levels. Cardiac meal planning includes limiting unhealthy fats, increasing healthy fats, and making other small dietary changes. General guidelines are as follows:  Adjust calorie intake to reach and maintain desirable body weight.  Limit total fat intake to less than 30% of total calories. Saturated fat should be less than 7% of calories.  Saturated fats are found in animal products and in some vegetable products. Saturated vegetable fats are found in coconut oil, cocoa butter, palm oil, and palm kernel oil. Read labels carefully to avoid these products as much as possible. Use butter in moderation. Choose tub margarines and oils that have 2 grams of fat or less. Good cooking oils are canola and olive oils.  Practice low-fat cooking techniques. Do not fry food. Instead, broil, bake, boil, steam, grill, roast on a rack, stir-fry, or microwave it. Other fat reducing suggestions include:  Remove the skin from poultry.  Remove all visible fat from meats.  Skim the fat off stews, soups, and gravies before serving them.  Steam vegetables in water or broth instead of sauting them in fat.  Avoid foods with trans fat (or hydrogenated oils), such as commercially fried foods and commercially baked goods. Commercial shortening and deep-frying fats will contain trans fat.  Increase intake of fruits, vegetables, whole grains, and legumes to replace foods high in fat.  Increase consumption of nuts, legumes, and seeds to at least 4 servings weekly. One serving of a legume equals  cup, and 1 serving of nuts or seeds equals  cup.  Choose whole grains more often. Have 3 servings per day (a serving is 1 ounce [oz]).  Eat 4 to 5 servings of  vegetables per day. A serving of vegetables is 1 cup of raw leafy vegetables;  cup of raw or cooked cut-up vegetables;  cup of vegetable juice.  Eat 4 to 5 servings of fruit per day. A serving of fruit is 1 medium whole fruit;  cup of dried fruit;  cup of fresh, frozen, or canned fruit;  cup of 100% fruit juice.  Increase your intake of dietary fiber to 20 to 30 grams per day. Insoluble fiber may help lower your risk of heart disease and may help curb your appetite.  Soluble fiber binds cholesterol to be removed from the blood. Foods high in soluble fiber are dried beans, citrus fruits, oats, apples, bananas, broccoli, Brussels sprouts, and eggplant.  Try to include foods fortified with plant sterols or stanols, such as yogurt, breads, juices, or margarines. Choose several fortified foods to achieve a daily intake of 2 to 3 grams of plant sterols or stanols.  Foods with omega-3 fats can help reduce your risk of heart disease. Aim to have a 3.5 oz portion of fatty fish twice per week, such as salmon, mackerel, albacore tuna, sardines, lake trout, or herring. If you wish to take a fish oil supplement, choose one that contains 1 gram of both DHA and EPA.  Limit processed meats to 2 servings (3 oz portion) weekly.  Limit the sodium in your diet to 1500 milligrams (mg) per day. If you have high blood pressure, talk to a registered dietitian about a DASH (Dietary Approaches to Stop Hypertension) eating plan.  Limit sweets and beverages with added  sugar, such as soda, to no more than 5 servings per week. One serving is:   1 tablespoon sugar.  1 tablespoon jelly or jam.   cup sorbet.  1 cup lemonade.   cup regular soda. CHOOSING FOODS Starches  Allowed: Breads: All kinds (wheat, rye, raisin, white, oatmeal, [place], [place], and English muffin bread). Low-fat rolls: English muffins, frankfurter and hamburger buns, bagels, pita bread, tortillas (not fried). Pancakes, waffles, biscuits, and  muffins made with recommended oil.  Avoid: Products made with saturated or trans fats, oils, or whole milk products. Butter rolls, cheese breads, croissants. Commercial doughnuts, muffins, sweet rolls, biscuits, waffles, pancakes, store-bought mixes. Crackers  Allowed: Low-fat crackers and snacks: Animal, graham, rye, saltine (with recommended oil, no lard), oyster, and matzo crackers. Bread sticks, melba toast, rusks, flatbread, pretzels, and light popcorn.  Avoid: High-fat crackers: cheese crackers, butter crackers, and those made with coconut, palm oil, or trans fat (hydrogenated oils). Buttered popcorn. Cereals  Allowed: Hot or cold whole-grain cereals.  Avoid: Cereals containing coconut, hydrogenated vegetable fat, or animal fat. Potatoes / Pasta / Rice  Allowed: All kinds of potatoes, rice, and pasta (such as macaroni, spaghetti, and noodles).  Avoid: Pasta or rice prepared with cream sauce or high-fat cheese. Chow mein noodles, [place] fries. Vegetables  Allowed: All vegetables and vegetable juices.  Avoid: Fried vegetables. Vegetables in cream, butter, or high-fat cheese sauces. Limit coconut. Fruit in cream or custard. Protein  Allowed: Limit your intake of meat, seafood, and poultry to no more than 6 oz (cooked weight) per day. All lean, well-trimmed beef, veal, pork, and lamb. All chicken and [place] without skin. All fish and shellfish. Wild game: wild duck, rabbit, pheasant, and venison. Egg whites or low-cholesterol egg substitutes may be used as desired. Meatless dishes: recipes with dried beans, peas, lentils, and tofu (soybean curd). Seeds and nuts: all seeds and most nuts.  Avoid: Prime grade and other heavily marbled and fatty meats, such as short ribs, spare ribs, rib eye roast or steak, frankfurters, sausage, bacon, and high-fat luncheon meats, mutton. Caviar. Commercially fried fish. Domestic duck, goose, venison sausage. Organ meats: liver, gizzard, heart,  chitterlings, brains, kidney, sweetbreads. Dairy  Allowed: Low-fat cheeses: nonfat or low-fat cottage cheese (1% or 2% fat), cheeses made with part skim milk, such as mozzarella, farmers, string, or ricotta. (Cheeses should be labeled no more than 2 to 6 grams fat per oz.). Skim (or 1%) milk: liquid, powdered, or evaporated. Buttermilk made with low-fat milk. Drinks made with skim or low-fat milk or cocoa. Chocolate milk or cocoa made with skim or low-fat (1%) milk. Nonfat or low-fat yogurt.  Avoid: Whole milk cheeses, including col[place], cheddar, [place], Monte[place] Jack, [place], [place], [place], American, Swiss, and blue. Creamed cottage cheese, cream cheese. Whole milk and whole milk products, including buttermilk or yogurt made from whole milk, drinks made from whole milk. Condensed milk, evaporated whole milk, and 2% milk. Soups and Combination Foods  Allowed: Low-fat low-sodium soups: broth, dehydrated soups, homemade broth, soups with the fat removed, homemade cream soups made with skim or low-fat milk. Low-fat spaghetti, lasagna, chili, and Spanish rice if low-fat ingredients and low-fat cooking techniques are used.  Avoid: Cream soups made with whole milk, cream, or high-fat cheese. All other soups. Desserts and Sweets  Allowed: Sherbet, fruit ices, gelatins, meringues, and angel food cake. Homemade desserts with recommended fats, oils, and milk products. Jam, jelly, honey, marmalade, sugars, and syrups. Pure sugar candy, such as gum drops, hard candy, jelly  beans, marshmallows, mints, and small amounts of dark chocolate.  Avoid: Commercially prepared cakes, pies, cookies, frosting, pudding, or mixes for these products. Desserts containing whole milk products, chocolate, coconut, lard, palm oil, or palm kernel oil. Ice cream or ice cream drinks. Candy that contains chocolate, coconut, butter, hydrogenated fat, or unknown ingredients. Buttered syrups. Fats and Oils  Allowed: Vegetable oils:  safflower, sunflower, corn, soybean, cottonseed, sesame, canola, olive, or peanut. Non-hydrogenated margarines. Salad dressing or mayonnaise: homemade or commercial, made with a recommended oil. Low or nonfat salad dressing or mayonnaise.  Limit added fats and oils to 6 to 8 tsp per day (includes fats used in cooking, baking, salads, and spreads on bread). Remember to count the "hidden fats" in foods.  Avoid: Solid fats and shortenings: butter, lard, salt pork, bacon drippings. Gravy containing meat fat, shortening, or suet. Cocoa butter, coconut. Coconut oil, palm oil, palm kernel oil, or hydrogenated oils: these ingredients are often used in bakery products, nondairy creamers, whipped toppings, candy, and commercially fried foods. Read labels carefully. Salad dressings made of unknown oils, sour cream, or cheese, such as blue cheese and Roquefort. Cream, all kinds: half-and-half, light, heavy, or whipping. Sour cream or cream cheese (even if "light" or low-fat). Nondairy cream substitutes: coffee creamers and sour cream substitutes made with palm, palm kernel, hydrogenated oils, or coconut oil. Beverages  Allowed: Coffee (regular or decaffeinated), tea. Diet carbonated beverages, mineral water. Alcohol: Check with your caregiver. Moderation is recommended.  Avoid: Whole milk, regular sodas, and juice drinks with added sugar. Condiments  Allowed: All seasonings and condiments. Cocoa powder. "Cream" sauces made with recommended ingredients.  Avoid: Carob powder made with hydrogenated fats. SAMPLE MENU Breakfast   cup orange juice   cup oatmeal  1 slice toast  1 tsp margarine  1 cup skim milk Lunch  [place] sandwich with 2 oz [place], 2 slices bread  Lettuce and tomato slices  Fresh fruit  Carrot sticks  Coffee or tea Snack  Fresh fruit or low-fat crackers Dinner  3 oz lean ground beef  1 baked potato  1 tsp margarine   cup asparagus  Lettuce salad  1 tbs  non-creamy dressing   cup peach slices  1 cup skim milk Document Released: 08/25/2008 Document Revised: 05/17/2012 Document Reviewed: 01/16/2014 [employer] Patient Information 2015 [place], [place]. This information is not intended to replace advice given to you by your health care provider. Make sure you discuss any questions you have with your health care provider.  Low-Sodium Eating Plan Sodium raises blood pressure and causes water to be held in the body. Getting less sodium from food will help lower your blood pressure, reduce any swelling, and protect your heart, liver, and kidneys. We get sodium by adding salt (sodium chloride) to food. Most of our sodium comes from canned, boxed, and frozen foods. Restaurant foods, fast foods, and pizza are also very high in sodium. Even if you take medicine to lower your blood pressure or to reduce fluid in your body, getting less sodium from your food is important. WHAT DO I NEED TO KNOW ABOUT THIS EATING PLAN? For the low-sodium eating plan, you will follow these general guidelines:  Choose foods with a % Daily Value for sodium of less than 5% (as listed on the food label).   Use salt-free seasonings or herbs instead of table salt or sea salt.   Check with your health care provider or pharmacist before using salt substitutes.   Eat fresh foods.  Eat  more vegetables and fruits.  Limit canned vegetables. If you do use them, rinse them well to decrease the sodium.   Limit cheese to 1 oz (28 g) per day.   Eat lower-sodium products, often labeled as "lower sodium" or "no salt added."  Avoid foods that contain monosodium glutamate (MSG). MSG is sometimes added to [place] food and some canned foods.  Check food labels (Nutrition Facts labels) on foods to learn how much sodium is in one serving.  Eat more home-cooked food and less restaurant, buffet, and fast food.  When eating at a restaurant, ask that your food be prepared with less  salt or none, if possible.  HOW DO I READ FOOD LABELS FOR SODIUM INFORMATION? The Nutrition Facts label lists the amount of sodium in one serving of the food. If you eat more than one serving, you must multiply the listed amount of sodium by the number of servings. Food labels may also identify foods as:  Sodium free--Less than 5 mg in a serving.  Very low sodium--35 mg or less in a serving.  Low sodium--140 mg or less in a serving.  Light in sodium--50% less sodium in a serving. For example, if a food that usually has 300 mg of sodium is changed to become light in sodium, it will have 150 mg of sodium.  Reduced sodium--25% less sodium in a serving. For example, if a food that usually has 400 mg of sodium is changed to reduced sodium, it will have 300 mg of sodium. WHAT FOODS CAN I EAT? Grains Low-sodium cereals, including oats, puffed wheat and rice, and shredded wheat cereals. Low-sodium crackers. Unsalted rice and pasta. Lower-sodium bread.  Vegetables Frozen or fresh vegetables. Low-sodium or reduced-sodium canned vegetables. Low-sodium or reduced-sodium tomato sauce and paste. Low-sodium or reduced-sodium tomato and vegetable juices.  Fruits Fresh, frozen, and canned fruit. Fruit juice.  Meat and Other Protein Products Low-sodium canned tuna and salmon. Fresh or frozen meat, poultry, seafood, and fish. Lamb. Unsalted nuts. Dried beans, peas, and lentils without added salt. Unsalted canned beans. Homemade soups without salt. Eggs.  Dairy Milk. Soy milk. Ricotta cheese. Low-sodium or reduced-sodium cheeses. Yogurt.  Condiments Fresh and dried herbs and spices. Salt-free seasonings. Onion and garlic powders. Low-sodium varieties of mustard and ketchup. Lemon juice.  Fats and Oils Reduced-sodium salad dressings. Unsalted butter.  Other Unsalted popcorn and pretzels.  The items listed above may not be a complete list of recommended foods or beverages. Contact your dietitian  for more options. WHAT FOODS ARE NOT RECOMMENDED? Grains Instant hot cereals. Bread stuffing, pancake, and biscuit mixes. Croutons. Seasoned rice or pasta mixes. Noodle soup cups. Boxed or frozen macaroni and cheese. Self-rising flour. Regular salted crackers. Vegetables Regular canned vegetables. Regular canned tomato sauce and paste. Regular tomato and vegetable juices. Frozen vegetables in sauces. Salted french fries. Olives. Angie Fava. Relishes. Sauerkraut. Salsa. Meat and Other Protein Products Salted, canned, smoked, spiced, or pickled meats, seafood, or fish. Bacon, ham, sausage, hot dogs, corned beef, chipped beef, and packaged luncheon meats. Salt pork. Jerky. Pickled herring. Anchovies, regular canned tuna, and sardines. Salted nuts. Dairy Processed cheese and cheese spreads. Cheese curds. Blue cheese and cottage cheese. Buttermilk.  Condiments Onion and garlic salt, seasoned salt, table salt, and sea salt. Canned and packaged gravies. Worcestershire sauce. Tartar sauce. Barbecue sauce. Teriyaki sauce. Soy sauce, including reduced sodium. Steak sauce. Fish sauce. Oyster sauce. Cocktail sauce. Horseradish. Regular ketchup and mustard. Meat flavorings and tenderizers. Bouillon cubes. Hot sauce. [place]  sauce. Marinades. Taco seasonings. Relishes. Fats and Oils Regular salad dressings. Salted butter. Margarine. Ghee. Bacon fat.  Other Potato and tortilla chips. Corn chips and puffs. Salted popcorn and pretzels. Canned or dried soups. Pizza. Frozen entrees and pot pies.  The items listed above may not be a complete list of foods and beverages to avoid. Contact your dietitian for more information. Document Released: 05/08/2002 Document Revised: 11/21/2013 Document Reviewed: 09/20/2013 [facility] Patient Information 2015 [place], [place]. This information is not intended to replace advice given to you by your health care provider. Make sure you discuss any questions you have with your  health care provider.

## 2015-07-02 NOTE — Progress Notes (Addendum)
Patient ID: Michelle Buck, female   DOB: 1936/04/11, 79 y.o.   MRN: [id number]    PCP: [name], NP  Allergies  Allergen Reactions  . Tramadol Other (See Comments)    Leg cramps   . Codeine Nausea And Vomiting    Patient states N/V with codeine  . Hydrocodone Nausea And Vomiting  . Oxycodone Other (See Comments)    Patient states she can tolerate oxycodone  . Tylenol [Acetaminophen] Hives  . Penicillins Rash    Patient states rash/itch with penicillin    Chief Complaint  Patient presents with  . Medical Management of Chronic Issues    1 month follow-up on HTN, blood sugar, and body aches   . Medication Management    Disucss Adviar, Hydrocodone, and Robitussin     HPI: Patient is a 79 y.o. female seen in the office today for follow up on blood pressure, body aches and blood sugar Fasting blood sugars ranging from 77-150s, no low blood sugars or hypoglycemia. Taking NPH 70/30, 40 in the morning and 20 with dinner.   Blood pressure ranging from 124-155/66-84 Eating bacon daily for breakfast Does not add salt.   Still have not received records from ortho, pt getting pain medication from orthopedics office. Following up august 12th.  Taking mobic, And etodolac schedule Vicodin as needed  Pain is not under control- orthopedics managing pain.   Advair causing her to be hoarse- taking routinely Coughing a lot, does not like taking robitussin.    Review of Systems:  Review of Systems  Constitutional: Negative for fever, chills, diaphoresis, appetite change and unexpected weight change.  HENT: Negative for congestion, ear discharge and ear pain.   Eyes: Negative for visual disturbance.  Respiratory: Positive for cough. Negative for shortness of breath and wheezing.   Cardiovascular: Positive for leg swelling. Negative for chest pain and palpitations.  Genitourinary: Negative for dysuria.  Musculoskeletal: Positive for back pain, arthralgias and neck pain.  Skin:  Negative for color change, rash and wound.  Neurological: Negative for dizziness, facial asymmetry, light-headedness and headaches.  Hematological: Negative for adenopathy.  Psychiatric/Behavioral: Negative for behavioral problems, dysphoric mood and agitation.    Past Medical History  Diagnosis Date  . Diabetes mellitus   . Hypertension   . Obese   . Hiatal hernia   . Scoliosis   . Arthritis   . Acute bronchitis   . COPD (chronic obstructive pulmonary disease)   . Unspecified hereditary and idiopathic peripheral neuropathy   . Secondary diabetes mellitus with renal manifestations, not stated as uncontrolled, or unspecified   . Chronic kidney disease, stage II (mild)   . Chronic kidney disease, stage II (mild)   . Chronic kidney disease, unspecified   . Disorder of bone and cartilage, unspecified   . Complications affecting other specified body systems, hypertension   . Hypopotassemia   . Anxiety   . Congestive heart failure, unspecified   . GERD (gastroesophageal reflux disease)   . Lumbago   . Insomnia, unspecified   . Hyperlipidemia   . Unspecified glaucoma   . Diaphragmatic hernia without mention of obstruction or gangrene   . Osteoarthrosis, unspecified whether generalized or localized, unspecified site    Past Surgical History  Procedure Laterality Date  . Abdominal hysterectomy    . Shoulder surgery    . Total hip arthroplasty      bilateral  . Knee arthroscopy      bilateral  . Colonoscopy  08/06/2011  . Foot surgery  Social History:   reports that she quit smoking about 18 years ago. Her smoking use included Cigarettes. She has never used smokeless tobacco. She reports that she does not drink alcohol or use illicit drugs.  Family History  Problem Relation Age of Onset  . Diabetes Mother   . Heart disease Mother   . Heart disease Brother   . Diabetes Brother   . Kidney disease Father   . Scoliosis Sister   . Diabetes Brother   . Diabetes Brother   .  Heart disease Brother   . Scoliosis Brother   . Stroke Brother   . Heart attack Brother     Medications: Patient's Medications  New Prescriptions   No medications on file  Previous Medications   ADVAIR DISKUS 250-50 MCG/DOSE AEPB    INHALE 1 PUFF BY MOUTH INTO THE LUNGS TWICE DAILY   ASPIRIN 81 MG TABLET    Take 81 mg by mouth daily.     CILOSTAZOL (PLETAL) 100 MG TABLET    TAKE 1 TABLET BY MOUTH EVERY DAY   CLONIDINE (CATAPRES) 0.1 MG TABLET    TAKE 1 TABLET BY MOUTH EVERY DAY   CYCLOSPORINE (RESTASIS) 0.05 % OPHTHALMIC EMULSION    1 drop 2 (two) times daily. One drop once a day bilaterally for dry eyes   DULOXETINE (CYMBALTA) 30 MG CAPSULE    One daily to help anxiety and pains   EASY TOUCH INSULIN SYRINGE 31G X 5/16" 0.5 ML MISC    USE UP TO 2 TIMES A DAY AS DIRECTED   ENALAPRIL (VASOTEC) 10 MG TABLET    One daily to control BP and to strengthen the heart   ESOMEPRAZOLE (NEXIUM) 40 MG CAPSULE    Take one table in the morning empty stomach daily and one in the evening only if needed for heartburn   ETODOLAC (LODINE) 400 MG TABLET    Take 400 mg by mouth 2 (two) times daily.   GABAPENTIN (NEURONTIN) 100 MG CAPSULE    Take 2 capsules (200 mg total) by mouth 3 (three) times daily.   GUAIFENESIN (ROBITUSSIN) 100 MG/5ML SYRUP    Take 5-10 mLs (100-200 mg total) by mouth every 4 (four) hours as needed for cough.   HYDRALAZINE (APRESOLINE) 50 MG TABLET    TAKE 1 TABLET BY MOUTH TWICE DAILY   HYDROCODONE-ACETAMINOPHEN (NORCO) 10-325 MG PER TABLET    1 tablet by mouth every 8 hours as needed for moderate to severe pain   INSULIN NPH-REGULAR HUMAN (HUMULIN 70/30) (70-30) 100 UNIT/ML INJECTION    Inject 40 units subcutaneously with breakfast and 20 units with dinner to control blood sugar.   LUBIPROSTONE (AMITIZA) 24 MCG CAPSULE    Take 1 capsule (24 mcg total) by mouth 2 (two) times daily with a meal.   MELOXICAM (MOBIC) 15 MG TABLET    TAKE 1 TABLET BY MOUTH EVERY DAY   METOPROLOL SUCCINATE  (TOPROL-XL) 25 MG 24 HR TABLET    TAKE 1 TABLET BY MOUTH EVERY DAY WITH OR IMMEDIATELY FOLLOWING A MEAL FOR BLOOD PRESSURE   POTASSIUM CHLORIDE SA (K-DUR,KLOR-CON) 20 MEQ TABLET    TAKE 1 TABLET BY MOUTH EVERY DAY   SERTRALINE (ZOLOFT) 50 MG TABLET    TAKE 1 TABLET BY MOUTH DAILY   SIMVASTATIN (ZOCOR) 20 MG TABLET    Take 1 tablet (20 mg total) by mouth daily.   SUCRALFATE (CARAFATE) 1 G TABLET    Take 1 g by mouth 2 (two) times daily.  TIMOLOL (TIMOPTIC) 0.5 % OPHTHALMIC SOLUTION    Place 1 drop into both eyes 2 (two) times daily. One drop in bilaterally twice a day as needed   TORSEMIDE (DEMADEX) 20 MG TABLET    Take 1 tablet in morning only to help with leg edema   TRAVOPROST, BAK FREE, (TRAVATAN) 0.004 % SOLN OPHTHALMIC SOLUTION    Place 1 drop into both eyes at bedtime. One drop bilaterally twice a day   VENTOLIN HFA 108 (90 BASE) MCG/ACT INHALER    INHALE 1 PUFF 4 TIMES A DAY FOR 5 DAYS, THEN USE EVERY 6 HOURS AS NEEDED   VOLTAREN 1 % GEL    APPLY 4 GRAM TOPICALLY TWICE DAILY  Modified Medications   No medications on file  Discontinued Medications   No medications on file     Physical Exam:  Filed Vitals:   07/02/15 0937  BP: 126/70  Pulse: 62  Temp: 97.7 F (36.5 C)  TempSrc: Oral  Height: 5\' 4"  (1.626 m)  Weight: 234 lb (106.142 kg)  SpO2: 93%    Physical Exam  Constitutional: She is oriented to person, place, and time. She appears well-developed and well-nourished. No distress.  HENT:  Head: Normocephalic and atraumatic.  Mouth/Throat: Oropharynx is clear and moist. No oropharyngeal exudate.  Eyes: Conjunctivae are normal. Pupils are equal, round, and reactive to light.  Neck: Normal range of motion. Neck supple.  Cardiovascular: Normal rate, regular rhythm and normal heart sounds.   Pulmonary/Chest: Effort normal and breath sounds normal.  Abdominal: Soft. Bowel sounds are normal.  Musculoskeletal: She exhibits edema (trace). She exhibits no tenderness.    Tenderness to left side of cervical spine andinto neck  Neurological: She is alert and oriented to person, place, and time.  Skin: Skin is warm and dry. She is not diaphoretic.  Psychiatric: She has a normal mood and affect.    Labs reviewed: Basic Metabolic Panel:  Recent Labs  10/15/14 1438 02/19/15 1057 03/14/15 0806  NA 136* 143 143  K 3.7 4.1 4.5  CL 92* 101 102  CO2 28 24 24   GLUCOSE 354* 166* 137*  BUN 49* 38* 34*  CREATININE 1.78* 2.34* 1.80*  CALCIUM 9.5 9.5 9.1   Liver Function Tests:  Recent Labs  10/10/14 1419 10/15/14 1438 03/14/15 0806  AST 13 14 20   ALT 6 7 9   ALKPHOS 102 101 98  BILITOT <0.2 0.3 <0.2  PROT 6.8 8.0 6.7  ALBUMIN  --  3.5  --    No results for input(s): LIPASE, AMYLASE in the last 8760 hours. No results for input(s): AMMONIA in the last 8760 hours. CBC:  Recent Labs  07/03/14 1056 10/15/14 1438  WBC 6.4 8.4  NEUTROABS  --  6.4  HGB 11.2 12.1  HCT 34.2 37.0  MCV 82 81.5  PLT 263 230   Lipid Panel:  Recent Labs  02/19/15 1057 03/14/15 0806  CHOL 174 147  HDL 44 41  LDLCALC 75 62  TRIG 275* 219*  CHOLHDL 4.0 3.6   TSH: No results for input(s): TSH in the last 8760 hours. A1C: Lab Results  Component Value Date   HGBA1C 7.3* 03/14/2015     Assessment/Plan 1. Type 2 diabetes mellitus with diabetic chronic kidney disease -blood sugar variable, discussed diabetic diet, cont medications.  Will follow up labs today. Pt following with Cathy on 07/22/15 -cont current medication - CBC with Differential -CMP -A1c  2. CKD (chronic kidney disease), stage 3 (moderate) -will follow up  blood work, cont hydration.  Pt current on NSAIDs due to severe arthritis, this may need to be stopped due to CKD, will follow up  3. Arthritis -severe arthritis to neck, shoulders and knees. Reports mobic upsets her stomach, in combination with CKD will STOP at this time  4. Chronic obstructive pulmonary disease, unspecified COPD,  unspecified chronic bronchitis type - COPD has been stable without exacerbation, has had increased cough, will start protonix to see if GERD is contributing to cough and congestion- to stop nexium  -to cont advair  5. Essential hypertension, benign Blood pressure stable, will cont catapres, enalapril, hydralazine, metoprolol  6. Constipation Stable on amitiza twice daily    7. GERD -will stop nexium and start protonix to see if this has better results.   Follow up in 3 months   Ezrah Dembeck K. Harle Battiest  [facility] [phone] 8 am - 5 pm) [phone] (after hours)

## 2015-07-03 LAB — COMPREHENSIVE METABOLIC PANEL
ALT: 6 IU/L (ref 0–32)
AST: 10 IU/L (ref 0–40)
Albumin/Globulin Ratio: 1.4 (ref 1.1–2.5)
Albumin: 3.8 g/dL (ref 3.5–4.8)
Alkaline Phosphatase: 109 IU/L (ref 39–117)
BUN/Creatinine Ratio: 14 (ref 11–26)
BUN: 24 mg/dL (ref 8–27)
Bilirubin Total: <0.2 mg/dL (ref 0.0–1.2)
CO2: 25 mmol/L (ref 18–29)
Calcium: 9.3 mg/dL (ref 8.7–10.3)
Chloride: 99 mmol/L (ref 97–108)
Creatinine, Ser: 1.75 mg/dL — ABNORMAL HIGH (ref 0.57–1.00)
GFR calc Af Amer: 32 mL/min/{1.73_m2} — ABNORMAL LOW (ref >59)
GFR calc non Af Amer: 28 mL/min/{1.73_m2} — ABNORMAL LOW (ref >59)
Globulin, Total: 2.8 g/dL (ref 1.5–4.5)
Glucose: 143 mg/dL — ABNORMAL HIGH (ref 65–99)
Potassium: 4 mmol/L (ref 3.5–5.2)
Sodium: 141 mmol/L (ref 134–144)
Total Protein: 6.6 g/dL (ref 6.0–8.5)

## 2015-07-03 LAB — CBC WITH DIFFERENTIAL/PLATELET
BASOS: 0 %
Basophils Absolute: 0 10*3/uL (ref 0.0–0.2)
EOS (ABSOLUTE): 0.3 10*3/uL (ref 0.0–0.4)
Eos: 4 %
Hematocrit: 33.3 % — ABNORMAL LOW (ref 34.0–46.6)
Hemoglobin: 11 g/dL — ABNORMAL LOW (ref 11.1–15.9)
IMMATURE GRANS (ABS): 0 10*3/uL (ref 0.0–0.1)
Immature Granulocytes: 0 %
LYMPHS ABS: 1.9 10*3/uL (ref 0.7–3.1)
Lymphs: 27 %
MCH: 27.4 pg (ref 26.6–33.0)
MCHC: 33 g/dL (ref 31.5–35.7)
MCV: 83 fL (ref 79–97)
Monocytes Absolute: 0.4 10*3/uL (ref 0.1–0.9)
Monocytes: 6 %
Neutrophils Absolute: 4.4 10*3/uL (ref 1.4–7.0)
Neutrophils: 63 %
Platelets: 235 10*3/uL (ref 150–379)
RBC: 4.01 x10E6/uL (ref 3.77–5.28)
RDW: 16 % — ABNORMAL HIGH (ref 12.3–15.4)
WBC: 6.9 10*3/uL (ref 3.4–10.8)

## 2015-07-03 LAB — HEMOGLOBIN A1C
Est. average glucose Bld gHb Est-mCnc: 148 mg/dL
Hgb A1c MFr Bld: 6.8 % — ABNORMAL HIGH (ref 4.8–5.6)

## 2015-07-05 ENCOUNTER — Emergency Department (HOSPITAL_COMMUNITY)
Admission: EM | Admit: 2015-07-05 | Discharge: 2015-07-05 | Disposition: A | Discharge status: Home / Self Care | Payer: Medicare Other | Attending: Emergency Medicine | Admitting: Emergency Medicine

## 2015-07-05 ENCOUNTER — Emergency Department (HOSPITAL_COMMUNITY): Payer: Medicare Other

## 2015-07-05 ENCOUNTER — Encounter (HOSPITAL_COMMUNITY): Payer: Self-pay | Admitting: Emergency Medicine

## 2015-07-05 DIAGNOSIS — Z7982 Long term (current) use of aspirin: Secondary | ICD-10-CM | POA: Insufficient documentation

## 2015-07-05 DIAGNOSIS — M419 Scoliosis, unspecified: Secondary | ICD-10-CM | POA: Insufficient documentation

## 2015-07-05 DIAGNOSIS — I509 Heart failure, unspecified: Secondary | ICD-10-CM | POA: Diagnosis not present

## 2015-07-05 DIAGNOSIS — Z794 Long term (current) use of insulin: Secondary | ICD-10-CM | POA: Insufficient documentation

## 2015-07-05 DIAGNOSIS — E785 Hyperlipidemia, unspecified: Secondary | ICD-10-CM | POA: Insufficient documentation

## 2015-07-05 DIAGNOSIS — I10 Essential (primary) hypertension: Secondary | ICD-10-CM | POA: Diagnosis not present

## 2015-07-05 DIAGNOSIS — E669 Obesity, unspecified: Secondary | ICD-10-CM | POA: Insufficient documentation

## 2015-07-05 DIAGNOSIS — M542 Cervicalgia: Secondary | ICD-10-CM | POA: Diagnosis present

## 2015-07-05 DIAGNOSIS — R079 Chest pain, unspecified: Secondary | ICD-10-CM | POA: Diagnosis not present

## 2015-07-05 DIAGNOSIS — J449 Chronic obstructive pulmonary disease, unspecified: Secondary | ICD-10-CM | POA: Insufficient documentation

## 2015-07-05 DIAGNOSIS — H409 Unspecified glaucoma: Secondary | ICD-10-CM | POA: Insufficient documentation

## 2015-07-05 DIAGNOSIS — E119 Type 2 diabetes mellitus without complications: Secondary | ICD-10-CM | POA: Insufficient documentation

## 2015-07-05 DIAGNOSIS — Z87891 Personal history of nicotine dependence: Secondary | ICD-10-CM | POA: Insufficient documentation

## 2015-07-05 DIAGNOSIS — M170 Bilateral primary osteoarthritis of knee: Secondary | ICD-10-CM | POA: Insufficient documentation

## 2015-07-05 DIAGNOSIS — E876 Hypokalemia: Secondary | ICD-10-CM | POA: Insufficient documentation

## 2015-07-05 DIAGNOSIS — Z79899 Other long term (current) drug therapy: Secondary | ICD-10-CM | POA: Diagnosis not present

## 2015-07-05 DIAGNOSIS — N182 Chronic kidney disease, stage 2 (mild): Secondary | ICD-10-CM | POA: Insufficient documentation

## 2015-07-05 DIAGNOSIS — F419 Anxiety disorder, unspecified: Secondary | ICD-10-CM | POA: Diagnosis not present

## 2015-07-05 DIAGNOSIS — Z791 Long term (current) use of non-steroidal anti-inflammatories (NSAID): Secondary | ICD-10-CM | POA: Diagnosis not present

## 2015-07-05 DIAGNOSIS — M47812 Spondylosis without myelopathy or radiculopathy, cervical region: Secondary | ICD-10-CM | POA: Diagnosis not present

## 2015-07-05 DIAGNOSIS — Z880 Allergy status to penicillin: Secondary | ICD-10-CM | POA: Insufficient documentation

## 2015-07-05 DIAGNOSIS — K219 Gastro-esophageal reflux disease without esophagitis: Secondary | ICD-10-CM | POA: Diagnosis not present

## 2015-07-05 DIAGNOSIS — G4700 Insomnia, unspecified: Secondary | ICD-10-CM | POA: Insufficient documentation

## 2015-07-05 DIAGNOSIS — I129 Hypertensive chronic kidney disease with stage 1 through stage 4 chronic kidney disease, or unspecified chronic kidney disease: Secondary | ICD-10-CM | POA: Diagnosis not present

## 2015-07-05 LAB — CBC
HCT: 35.9 % — ABNORMAL LOW (ref 36.0–46.0)
Hemoglobin: 11.7 g/dL — ABNORMAL LOW (ref 12.0–15.0)
MCH: 27.9 pg (ref 26.0–34.0)
MCHC: 32.6 g/dL (ref 30.0–36.0)
MCV: 85.5 fL (ref 78.0–100.0)
PLATELETS: 233 10*3/uL (ref 150–400)
RBC: 4.2 MIL/uL (ref 3.87–5.11)
RDW: 15.4 % (ref 11.5–15.5)
WBC: 8.2 10*3/uL (ref 4.0–10.5)

## 2015-07-05 LAB — BASIC METABOLIC PANEL
ANION GAP: 9 (ref 5–15)
BUN: 23 mg/dL — AB (ref 6–20)
CO2: 29 mmol/L (ref 22–32)
CREATININE: 1.91 mg/dL — AB (ref 0.44–1.00)
Calcium: 9.4 mg/dL (ref 8.9–10.3)
Chloride: 104 mmol/L (ref 101–111)
GFR, EST AFRICAN AMERICAN: 28 mL/min — AB (ref >60)
GFR, EST NON AFRICAN AMERICAN: 24 mL/min — AB (ref >60)
GLUCOSE: 130 mg/dL — AB (ref 65–99)
Potassium: 3.5 mmol/L (ref 3.5–5.1)
SODIUM: 142 mmol/L (ref 135–145)

## 2015-07-05 LAB — I-STAT TROPONIN, ED: Troponin i, poc: 0.01 ng/mL (ref 0.00–0.08)

## 2015-07-05 NOTE — ED Notes (Signed)
Pt A+Ox4, reports headache and L sided neck pain x1 mo, seen by PCP.  Pt reports pain is worsening.  Pt reports pain up into head and down into LUE, pt also reports c/o L sided CP.  Pt reports pain reproducible with arm movements, denies radiation.  Speaking full/clear sentences, rr even/un-lab.  +csm/+pulses.  Pt denies injury.  Skin PWD.  Neuros grossly intact.  NAD.

## 2015-07-05 NOTE — ED Provider Notes (Signed)
  Face-to-face evaluation   History: She complains of generalized achiness, head, neck, back, arms and legs, for greater than 1 month. She has seen her orthopedist who recommended oxycodone, and her PCP on 07/02/15. PCP recommended that she stop taking Mobic. No recent fall.  Physical exam: Obese, elderly female in mild pain. Tender lower cervical spine and midline, and bilateral trapezius muscles. Legs nontender anterior shins.  Evaluation consistent with pain, generalized, secondary osteoarthritis.  Medical screening examination/treatment/procedure(s) were conducted as a shared visit with non-physician practitioner(s) and myself.  I personally evaluated the patient during the encounter  Daleen Bo, MD 07/10/15 [phone]

## 2015-07-05 NOTE — Discharge Instructions (Signed)
Take Tylenol and use ice and heat on your neck.  Follow-up with your primary care doctor.  Return here as needed

## 2015-07-05 NOTE — ED Provider Notes (Signed)
CSN: [id number]     Arrival date & time 07/05/15  1715 History   First MD Initiated Contact with Patient 07/05/15 2042     Chief Complaint  Patient presents with  . Neck Pain    L sided neck pain x1 mo "woke up with it this morning worse", "from my head down into my arm"  . Chest Pain    L sided "when I'm breathing and coming from my neck"     (Consider location/radiation/quality/duration/timing/severity/associated sxs/prior Treatment) HPI Patient presents to the emergency department with several month history of neck pain.  She has also had chest pain for a year and states that the neck pain is reproducible with arm movements.  Patient has seen her primary care doctor about this.  Patient states that nothing seems make her condition better.  Patient denies take any medications prior to arrival.  The patient denies shortness of breath, nausea, vomiting, weakness, dizziness, headache, blurred vision, cough, runny nose, sore throat, fever, abdominal pain, dysuria, incontinence, bloody stool, hematuria, or syncope.  The patient states that she was concerned that the neck pain was continuing Past Medical History  Diagnosis Date  . Diabetes mellitus   . Hypertension   . Obese   . Hiatal hernia   . Scoliosis   . Arthritis   . Acute bronchitis   . COPD (chronic obstructive pulmonary disease)   . Unspecified hereditary and idiopathic peripheral neuropathy   . Secondary diabetes mellitus with renal manifestations, not stated as uncontrolled, or unspecified   . Chronic kidney disease, stage II (mild)   . Chronic kidney disease, stage II (mild)   . Chronic kidney disease, unspecified   . Disorder of bone and cartilage, unspecified   . Complications affecting other specified body systems, hypertension   . Hypopotassemia   . Anxiety   . Congestive heart failure, unspecified   . GERD (gastroesophageal reflux disease)   . Lumbago   . Insomnia, unspecified   . Hyperlipidemia   . Unspecified  glaucoma   . Diaphragmatic hernia without mention of obstruction or gangrene   . Osteoarthrosis, unspecified whether generalized or localized, unspecified site   . Degenerative arthritis of knee, bilateral    Past Surgical History  Procedure Laterality Date  . Abdominal hysterectomy    . Shoulder surgery    . Total hip arthroplasty      bilateral  . Knee arthroscopy      bilateral  . Colonoscopy  08/06/2011  . Foot surgery     Family History  Problem Relation Age of Onset  . Diabetes Mother   . Heart disease Mother   . Heart disease Brother   . Diabetes Brother   . Kidney disease Father   . Scoliosis Sister   . Diabetes Brother   . Diabetes Brother   . Heart disease Brother   . Scoliosis Brother   . Stroke Brother   . Heart attack Brother    History  Substance Use Topics  . Smoking status: Former Smoker    Types: Cigarettes    Quit date: 02/27/1997  . Smokeless tobacco: Never Used  . Alcohol Use: No   OB History    No data available     Review of Systems All other systems negative except as documented in the HPI. All pertinent positives and negatives as reviewed in the HPI.   Allergies  Tramadol; Codeine; Hydrocodone; Oxycodone; Tylenol; and Penicillins  Home Medications   Prior to Admission medications   Medication  Sig Start Date End Date Taking? Authorizing Provider  ADVAIR DISKUS 250-50 MCG/DOSE AEPB INHALE 1 PUFF BY MOUTH INTO THE LUNGS TWICE DAILY 04/04/15   [name], NP  aspirin 81 MG tablet Take 81 mg by mouth daily.      Historical Provider, MD  cilostazol (PLETAL) 100 MG tablet TAKE 1 TABLET BY MOUTH EVERY DAY 04/04/15   [name], NP  cloNIDine (CATAPRES) 0.1 MG tablet TAKE 1 TABLET BY MOUTH EVERY DAY 02/07/15   Blanchie Serve, MD  cycloSPORINE (RESTASIS) 0.05 % ophthalmic emulsion 1 drop 2 (two) times daily. One drop once a day bilaterally for dry eyes    Historical Provider, MD  DULoxetine (CYMBALTA) 30 MG capsule One daily to help  anxiety and pains 02/21/15   Blanchie Serve, MD  EASY [facility] INSULIN SYRINGE 31G X 5/16" 0.5 ML MISC USE UP TO 2 TIMES A DAY AS DIRECTED 05/29/15   [name], NP  enalapril (VASOTEC) 10 MG tablet One daily to control BP and to strengthen the heart 01/29/15   Estill Dooms, MD  etodolac (LODINE) 400 MG tablet Take 400 mg by mouth 2 (two) times daily. 05/29/15   Historical Provider, MD  gabapentin (NEURONTIN) 100 MG capsule Take 2 capsules (200 mg total) by mouth 3 (three) times daily. 05/30/15   [name], NP  guaifenesin (ROBITUSSIN) 100 MG/5ML syrup Take 5-10 mLs (100-200 mg total) by mouth every 4 (four) hours as needed for cough. 10/15/14   Milton Ferguson, MD  hydrALAZINE (APRESOLINE) 50 MG tablet TAKE 1 TABLET BY MOUTH TWICE DAILY 06/26/15   [name], NP  HYDROcodone-acetaminophen [facility]) 10-325 MG per tablet 1 tablet by mouth every 8 hours as needed for moderate to severe pain 01/03/14   Historical Provider, MD  insulin NPH-regular Human (HUMULIN 70/30) (70-30) 100 UNIT/ML injection Inject 40 units subcutaneously with breakfast and 20 units with dinner to control blood sugar. 02/01/15   Estill Dooms, MD  lubiprostone (AMITIZA) 24 MCG capsule Take 1 capsule (24 mcg total) by mouth 2 (two) times daily with a meal. 05/30/15   [name], NP  metoprolol succinate (TOPROL-XL) 25 MG 24 hr tablet TAKE 1 TABLET BY MOUTH EVERY DAY WITH OR IMMEDIATELY FOLLOWING A MEAL FOR BLOOD PRESSURE 02/07/15   Blanchie Serve, MD  pantoprazole (PROTONIX) 40 MG tablet Take 1 tablet (40 mg total) by mouth daily. For acid reflux 07/02/15   [name], NP  potassium chloride SA (K-DUR,KLOR-CON) 20 MEQ tablet TAKE 1 TABLET BY MOUTH EVERY DAY 06/26/15   [name], NP  sertraline (ZOLOFT) 50 MG tablet TAKE 1 TABLET BY MOUTH DAILY 02/07/15   Blanchie Serve, MD  simvastatin (ZOCOR) 20 MG tablet Take 1 tablet (20 mg total) by mouth daily. 02/21/15   Blanchie Serve, MD  sucralfate (CARAFATE) 1 G tablet  Take 1 g by mouth 2 (two) times daily.    Historical Provider, MD  timolol (TIMOPTIC) 0.5 % ophthalmic solution Place 1 drop into both eyes 2 (two) times daily. One drop in bilaterally twice a day as needed    Historical Provider, MD  torsemide (DEMADEX) 20 MG tablet Take 1 tablet in morning only to help with leg edema 02/21/15   Blanchie Serve, MD  Travoprost, BAK Free, (TRAVATAN) 0.004 % SOLN ophthalmic solution Place 1 drop into both eyes at bedtime. One drop bilaterally twice a day    Historical Provider, MD  VENTOLIN HFA 108 (90 BASE) MCG/ACT inhaler  INHALE 1 PUFF 4 TIMES A DAY FOR 5 DAYS, THEN USE EVERY 6 HOURS AS NEEDED 04/17/15   Estill Dooms, MD  VOLTAREN 1 % GEL APPLY 4 GRAM TOPICALLY TWICE DAILY 06/26/15   [name], NP   BP 168/70 mmHg  Pulse 64  Temp(Src) 98.4 F (36.9 C) (Oral)  Resp 19  SpO2 97% Physical Exam  Constitutional: She is oriented to person, place, and time. She appears well-developed and well-nourished. No distress.  HENT:  Head: Normocephalic and atraumatic.  Mouth/Throat: Oropharynx is clear and moist.  Eyes: Pupils are equal, round, and reactive to light.  Neck: Normal range of motion. Neck supple.  Cardiovascular: Normal rate, regular rhythm and normal heart sounds.  Exam reveals no gallop and no friction rub.   No murmur heard. Pulmonary/Chest: Effort normal and breath sounds normal. No respiratory distress.  Musculoskeletal:       Back:  Neurological: She is alert and oriented to person, place, and time. She exhibits normal muscle tone. Coordination normal.  Skin: Skin is warm and dry. No rash noted. No erythema.  Psychiatric: She has a normal mood and affect. Her behavior is normal.  Nursing note and vitals reviewed.   ED Course  Procedures (including critical care time) Labs Review Labs Reviewed  BASIC METABOLIC PANEL - Abnormal; Notable for the following:    Glucose, Bld 130 (*)    BUN 23 (*)    Creatinine, Ser 1.91 (*)    GFR calc non  Af Amer 24 (*)    GFR calc Af Amer 28 (*)    All other components within normal limits  CBC - Abnormal; Notable for the following:    Hemoglobin 11.7 (*)    HCT 35.9 (*)    All other components within normal limits  I-STAT TROPOININ, ED    Imaging Review Dg Chest 2 View  07/05/2015   CLINICAL DATA:  Headache with left-sided neck pain for 1 month. History of congestive heart failure and COPD. Initial encounter.  EXAM: CHEST  2 VIEW  COMPARISON:  Radiographs 05/13/2014 and 10/15/2014.  CT 02/29/2012.  FINDINGS: The heart size and mediastinal contours are stable. There is a moderate size hiatal hernia. There is chronic eventration of the right hemidiaphragm. The lungs appear unchanged with mild chronic interstitial prominence. There is no airspace disease, edema or significant pleural effusion. Advanced glenohumeral degenerative changes are present bilaterally. No acute osseous findings evident.  IMPRESSION: Stable chest.  No acute cardiopulmonary process.   Electronically Signed   By: Richardean Sale M.D.   On: 07/05/2015 20:49     EKG Interpretation   Date/Time:  Friday July 05 2015 18:12:42 EDT Ventricular Rate:  54 PR Interval:  131 QRS Duration: 79 QT Interval:  395 QTC Calculation: 374 R Axis:   64 Text Interpretation:  Sinus rhythm Normal ECG When compared with ECG of  10/15/2014, No significant change was found Confirmed by [facility]  MD, DAVID  ([id number]) on 07/05/2015 6:21:01 PM      Patient be treated for her pain.  Told to follow up with her primary care doctor this is a chronic problem.  There is no new symptoms or findings.  The patient is advised to return here as needed.  Patient agrees the plan and all questions were answered  Dalia Heading, PA-C 07/07/15 0041  Daleen Bo, MD 07/10/15 [phone]

## 2015-07-12 ENCOUNTER — Ambulatory Visit (INDEPENDENT_AMBULATORY_CARE_PROVIDER_SITE_OTHER): Payer: Medicare Other | Admitting: Podiatry

## 2015-07-12 ENCOUNTER — Encounter: Payer: Self-pay | Admitting: Podiatry

## 2015-07-12 ENCOUNTER — Ambulatory Visit: Payer: Medicare Other | Admitting: Podiatry

## 2015-07-12 VITALS — BP 128/63 | HR 62 | Resp 20

## 2015-07-12 DIAGNOSIS — E1140 Type 2 diabetes mellitus with diabetic neuropathy, unspecified: Secondary | ICD-10-CM

## 2015-07-12 DIAGNOSIS — M79676 Pain in unspecified toe(s): Secondary | ICD-10-CM | POA: Diagnosis not present

## 2015-07-12 DIAGNOSIS — B351 Tinea unguium: Secondary | ICD-10-CM

## 2015-07-12 DIAGNOSIS — M5136 Other intervertebral disc degeneration, lumbar region: Secondary | ICD-10-CM | POA: Diagnosis not present

## 2015-07-12 DIAGNOSIS — E1149 Type 2 diabetes mellitus with other diabetic neurological complication: Secondary | ICD-10-CM

## 2015-07-12 NOTE — Progress Notes (Signed)
Patient ID: Michelle Buck, female   DOB: 1936-11-05, 79 y.o.   MRN: [id number] Complaint:  Visit Type: Patient returns to my office for continued preventative foot care services. Complaint: Patient states" my nails have grown long and thick and become painful to walk and wear shoes" Patient has been diagnosed with DM with no foot complications. The patient presents for preventative foot care services. No changes to ROS  Podiatric Exam: Vascular: dorsalis pedis and posterior tibial pulses are palpable bilateral. Capillary return is immediate. Temperature gradient is WNL. Skin turgor WNL  Sensorium: Normal Semmes Weinstein monofilament test. Normal tactile sensation bilaterally. Nail Exam: Pt has thick disfigured discolored nails with subungual debris noted bilateral entire nail hallux through fifth toenails Ulcer Exam: There is no evidence of ulcer or pre-ulcerative changes or infection. Orthopedic Exam: Muscle tone and strength are WNL. No limitations in general ROM. No crepitus or effusions noted. Foot type and digits show no abnormalities. Bony prominences are unremarkable. Skin: No Porokeratosis. No infection or ulcers  Diagnosis:  Onychomycosis, , Pain in right toe, pain in left toes  Treatment & Plan Procedures and Treatment: Consent by patient was obtained for treatment procedures. The patient understood the discussion of treatment and procedures well. All questions were answered thoroughly reviewed. Debridement of mycotic and hypertrophic toenails, 1 through 5 bilateral and clearing of subungual debris. No ulceration, no infection noted.  Return Visit-Office Procedure: Patient instructed to return to the office for a follow up visit 3 months for continued evaluation and treatment.

## 2015-07-16 ENCOUNTER — Encounter: Payer: Self-pay | Admitting: Nurse Practitioner

## 2015-07-16 ENCOUNTER — Ambulatory Visit (INDEPENDENT_AMBULATORY_CARE_PROVIDER_SITE_OTHER): Payer: Medicare Other | Admitting: Nurse Practitioner

## 2015-07-16 VITALS — BP 122/78 | HR 59 | Temp 98.2°F | Resp 20 | Ht 64.0 in | Wt 239.8 lb

## 2015-07-16 DIAGNOSIS — E1143 Type 2 diabetes mellitus with diabetic autonomic (poly)neuropathy: Secondary | ICD-10-CM

## 2015-07-16 DIAGNOSIS — M4806 Spinal stenosis, lumbar region: Secondary | ICD-10-CM

## 2015-07-16 DIAGNOSIS — M48061 Spinal stenosis, lumbar region without neurogenic claudication: Secondary | ICD-10-CM

## 2015-07-16 DIAGNOSIS — K219 Gastro-esophageal reflux disease without esophagitis: Secondary | ICD-10-CM

## 2015-07-16 DIAGNOSIS — M47812 Spondylosis without myelopathy or radiculopathy, cervical region: Secondary | ICD-10-CM

## 2015-07-16 DIAGNOSIS — M4692 Unspecified inflammatory spondylopathy, cervical region: Secondary | ICD-10-CM

## 2015-07-16 DIAGNOSIS — J449 Chronic obstructive pulmonary disease, unspecified: Secondary | ICD-10-CM | POA: Diagnosis not present

## 2015-07-16 DIAGNOSIS — K5900 Constipation, unspecified: Secondary | ICD-10-CM | POA: Diagnosis not present

## 2015-07-16 MED ORDER — ESOMEPRAZOLE MAGNESIUM 40 MG PO CPDR: 40.0000 mg | DELAYED_RELEASE_CAPSULE | Freq: Every day | ORAL | Status: DC

## 2015-07-16 NOTE — Patient Instructions (Signed)
Make sure you are taking your advair TWICE daily for lungs  Will stop protonix and start nexium back at this time  Make sure you are taking amitiza 24 mcg twice daily for constipation

## 2015-07-16 NOTE — Progress Notes (Signed)
Patient ID: Michelle Buck, female   DOB: 1936-10-07, 79 y.o.   MRN: [id number]    PCP: [name], NP  Allergies  Allergen Reactions  . Tramadol Other (See Comments)    Leg cramps   . Codeine Nausea And Vomiting    Patient states N/V with codeine  . Hydrocodone Nausea And Vomiting  . Oxycodone Other (See Comments)    Patient states she can tolerate oxycodone  . Tylenol [Acetaminophen] Hives  . Penicillins Rash    Patient states rash/itch with penicillin    Chief Complaint  Patient presents with  . Medical Management of Chronic Issues    follow-up from ER vist     HPI: Patient is a 79 y.o. female seen in the office today to follow up ED visit due to chronic neck pain. Pt was seen by orthopedic due to arthritis in her neck and shoulders. Went to the ED due to worsening pain on 07/05/15, then saw orthopedics on 07/12/15.  Orthopedic awaiting images from the ED. Did not change medication.    Gabapentin helping her neuropathy in her legs but still has pain in her bottom.   protonix is not working as well has nexium. Worsening acid reflex  Not taking advair.   Does not read so she can not to her medication. Her sister manages her medication. Review of Systems:  Review of Systems  Constitutional: Negative for fever, chills, diaphoresis, appetite change and unexpected weight change.  HENT: Negative for congestion, ear discharge and ear pain.   Eyes: Negative for visual disturbance.  Respiratory: Negative for cough, shortness of breath and wheezing.   Cardiovascular: Negative for chest pain and palpitations.  Gastrointestinal: Positive for constipation (controlled on medication).  Genitourinary: Negative for dysuria.  Musculoskeletal: Positive for back pain, arthralgias and neck pain.  Skin: Negative for color change, rash and wound.  Neurological: Positive for numbness (neuropathy to hands and legs) and headaches. Negative for dizziness, facial asymmetry and  light-headedness.  Hematological: Negative for adenopathy.  Psychiatric/Behavioral: Negative for behavioral problems, dysphoric mood and agitation.    Past Medical History  Diagnosis Date  . Diabetes mellitus   . Hypertension   . Obese   . Hiatal hernia   . Scoliosis   . Arthritis   . Acute bronchitis   . COPD (chronic obstructive pulmonary disease)   . Unspecified hereditary and idiopathic peripheral neuropathy   . Secondary diabetes mellitus with renal manifestations, not stated as uncontrolled, or unspecified   . Chronic kidney disease, stage II (mild)   . Chronic kidney disease, stage II (mild)   . Chronic kidney disease, unspecified   . Disorder of bone and cartilage, unspecified   . Complications affecting other specified body systems, hypertension   . Hypopotassemia   . Anxiety   . Congestive heart failure, unspecified   . GERD (gastroesophageal reflux disease)   . Lumbago   . Insomnia, unspecified   . Hyperlipidemia   . Unspecified glaucoma   . Diaphragmatic hernia without mention of obstruction or gangrene   . Osteoarthrosis, unspecified whether generalized or localized, unspecified site   . Degenerative arthritis of knee, bilateral    Past Surgical History  Procedure Laterality Date  . Abdominal hysterectomy    . Shoulder surgery    . Total hip arthroplasty      bilateral  . Knee arthroscopy      bilateral  . Colonoscopy  08/06/2011  . Foot surgery     Social History:   reports  that she quit smoking about 18 years ago. Her smoking use included Cigarettes. She has never used smokeless tobacco. She reports that she does not drink alcohol or use illicit drugs.  Family History  Problem Relation Age of Onset  . Diabetes Mother   . Heart disease Mother   . Heart disease Brother   . Diabetes Brother   . Kidney disease Father   . Scoliosis Sister   . Diabetes Brother   . Diabetes Brother   . Heart disease Brother   . Scoliosis Brother   . Stroke Brother     . Heart attack Brother     Medications: Patient's Medications  New Prescriptions   ESOMEPRAZOLE (NEXIUM) 40 MG CAPSULE    Take 1 capsule (40 mg total) by mouth daily.  Previous Medications   ADVAIR DISKUS 250-50 MCG/DOSE AEPB    INHALE 1 PUFF BY MOUTH INTO THE LUNGS TWICE DAILY   ASPIRIN 81 MG TABLET    Take 81 mg by mouth daily.     CILOSTAZOL (PLETAL) 100 MG TABLET    TAKE 1 TABLET BY MOUTH EVERY DAY   CLONIDINE (CATAPRES) 0.1 MG TABLET    TAKE 1 TABLET BY MOUTH EVERY DAY   CYCLOSPORINE (RESTASIS) 0.05 % OPHTHALMIC EMULSION    1 drop 2 (two) times daily. One drop once a day bilaterally for dry eyes   DULOXETINE (CYMBALTA) 30 MG CAPSULE    One daily to help anxiety and pains   EASY TOUCH INSULIN SYRINGE 31G X 5/16" 0.5 ML MISC    USE UP TO 2 TIMES A DAY AS DIRECTED   ENALAPRIL (VASOTEC) 10 MG TABLET    One daily to control BP and to strengthen the heart   GABAPENTIN (NEURONTIN) 100 MG CAPSULE    Take 2 capsules (200 mg total) by mouth 3 (three) times daily.   GUAIFENESIN (ROBITUSSIN) 100 MG/5ML SYRUP    Take 5-10 mLs (100-200 mg total) by mouth every 4 (four) hours as needed for cough.   HYDRALAZINE (APRESOLINE) 50 MG TABLET    TAKE 1 TABLET BY MOUTH TWICE DAILY   HYDROCODONE-ACETAMINOPHEN (NORCO) 10-325 MG PER TABLET    1 tablet by mouth every 8 hours as needed for moderate to severe pain   INSULIN NPH-REGULAR HUMAN (HUMULIN 70/30) (70-30) 100 UNIT/ML INJECTION    Inject 40 units subcutaneously with breakfast and 20 units with dinner to control blood sugar.   LUBIPROSTONE (AMITIZA) 24 MCG CAPSULE    Take 1 capsule (24 mcg total) by mouth 2 (two) times daily with a meal.   METOPROLOL SUCCINATE (TOPROL-XL) 25 MG 24 HR TABLET    TAKE 1 TABLET BY MOUTH EVERY DAY WITH OR IMMEDIATELY FOLLOWING A MEAL FOR BLOOD PRESSURE   POTASSIUM CHLORIDE SA (K-DUR,KLOR-CON) 20 MEQ TABLET    TAKE 1 TABLET BY MOUTH EVERY DAY   SERTRALINE (ZOLOFT) 50 MG TABLET    TAKE 1 TABLET BY MOUTH DAILY   SIMVASTATIN  (ZOCOR) 20 MG TABLET    Take 1 tablet (20 mg total) by mouth daily.   SUCRALFATE (CARAFATE) 1 G TABLET    Take 1 g by mouth 2 (two) times daily.   TIMOLOL (TIMOPTIC) 0.5 % OPHTHALMIC SOLUTION    Place 1 drop into both eyes 2 (two) times daily. One drop in bilaterally twice a day as needed   TORSEMIDE (DEMADEX) 20 MG TABLET    Take 1 tablet in morning only to help with leg edema   TRAVOPROST, BAK FREE, (TRAVATAN)  0.004 % SOLN OPHTHALMIC SOLUTION    Place 1 drop into both eyes at bedtime. One drop bilaterally twice a day   VENTOLIN HFA 108 (90 BASE) MCG/ACT INHALER    INHALE 1 PUFF 4 TIMES A DAY FOR 5 DAYS, THEN USE EVERY 6 HOURS AS NEEDED   VOLTAREN 1 % GEL    APPLY 4 GRAM TOPICALLY TWICE DAILY  Modified Medications   No medications on file  Discontinued Medications   AMITIZA 8 MCG CAPSULE       ETODOLAC (LODINE) 400 MG TABLET    Take 400 mg by mouth 2 (two) times daily.   MELOXICAM (MOBIC) 15 MG TABLET       NEXIUM 40 MG CAPSULE       PANTOPRAZOLE (PROTONIX) 40 MG TABLET    Take 1 tablet (40 mg total) by mouth daily. For acid reflux     Physical Exam:  Filed Vitals:   07/16/15 1453  BP: 122/78  Pulse: 59  Temp: 98.2 F (36.8 C)  TempSrc: Oral  Resp: 20  Height: 5\' 4"  (1.626 m)  Weight: 239 lb 12.8 oz (108.773 kg)  SpO2: 94%    Physical Exam  Constitutional: She is oriented to person, place, and time. She appears well-developed and well-nourished. No distress.  HENT:  Head: Normocephalic and atraumatic.  Mouth/Throat: Oropharynx is clear and moist. No oropharyngeal exudate.  Eyes: Conjunctivae are normal. Pupils are equal, round, and reactive to light.  Neck: Normal range of motion. Neck supple.  Cardiovascular: Normal rate, regular rhythm and normal heart sounds.   Pulmonary/Chest: Effort normal and breath sounds normal.  Abdominal: Soft. Bowel sounds are normal.  Musculoskeletal: She exhibits no edema.       Cervical back: She exhibits decreased range of motion and  tenderness.  Uses rolling walker   Neurological: She is alert and oriented to person, place, and time.  Skin: Skin is warm and dry. She is not diaphoretic.  Psychiatric: She has a normal mood and affect.    Labs reviewed: Basic Metabolic Panel:  Recent Labs  03/14/15 0806 07/02/15 1026 07/05/15 2031  NA 143 141 142  K 4.5 4.0 3.5  CL 102 99 104  CO2 24 25 29   GLUCOSE 137* 143* 130*  BUN 34* 24 23*  CREATININE 1.80* 1.75* 1.91*  CALCIUM 9.1 9.3 9.4   Liver Function Tests:  Recent Labs  10/15/14 1438 03/14/15 0806 07/02/15 1026  AST 14 20 10   ALT 7 9 6   ALKPHOS 101 98 109  BILITOT 0.3 <0.2 <0.2  PROT 8.0 6.7 6.6  ALBUMIN 3.5  --   --    No results for input(s): LIPASE, AMYLASE in the last 8760 hours. No results for input(s): AMMONIA in the last 8760 hours. CBC:  Recent Labs  10/15/14 1438 07/02/15 1026 07/05/15 2031  WBC 8.4 6.9 8.2  NEUTROABS 6.4 4.4  --   HGB 12.1  --  11.7*  HCT 37.0 33.3* 35.9*  MCV 81.5  --  85.5  PLT 230  --  233   Lipid Panel:  Recent Labs  02/19/15 1057 03/14/15 0806  CHOL 174 147  HDL 44 41  LDLCALC 75 62  TRIG 275* 219*  CHOLHDL 4.0 3.6   TSH: No results for input(s): TSH in the last 8760 hours. A1C: Lab Results  Component Value Date   HGBA1C 6.8* 07/02/2015     Assessment/Plan 1. Gastroesophageal reflux disease without esophagitis -protonix not has effective, will stop and restart Nexium  at this time.  - esomeprazole (NEXIUM) 40 MG capsule; Take 1 capsule (40 mg total) by mouth daily.  Dispense: 30 capsule; Refill: 3  2. Cervical arthritis -worsening pain which sent her to the ED however no new imaging obtained, has followed with ortho since who plans to send her for further imagining.   3. Constipation, unspecified constipation type Controlled on amitiza 24 mcg twice daily   4. Spinal stenosis of lumbar region Chronic pain which has been unchanged. Following with orthopedics, conts on  hydrocodone-APAP  5. Diabetic autonomic neuropathy associated with type 2 diabetes mellitus Gabapentin increased to 200 mg TID at previous visit, pt reports this has help the neuropathy at this time. Will cont current regimen  6. Chronic obstructive pulmonary disease, unspecified COPD, unspecified chronic bronchitis type -pt reported frequent cough at last visit however not taking Advair, to restart Advair twice daily, to use albuterol AS needed.   To keep follow up appt   Michelle Ammar K. Harle Battiest  [facility] [phone] 8 am - 5 pm) [phone] (after hours)

## 2015-07-22 ENCOUNTER — Encounter: Payer: Self-pay | Admitting: Pharmacotherapy

## 2015-07-22 ENCOUNTER — Ambulatory Visit (INDEPENDENT_AMBULATORY_CARE_PROVIDER_SITE_OTHER): Payer: Medicare Other | Admitting: Pharmacotherapy

## 2015-07-22 VITALS — BP 130/66 | HR 66 | Temp 98.2°F | Resp 22 | Ht 64.0 in | Wt 232.4 lb

## 2015-07-22 DIAGNOSIS — N183 Chronic kidney disease, stage 3 (moderate): Secondary | ICD-10-CM

## 2015-07-22 DIAGNOSIS — E1122 Type 2 diabetes mellitus with diabetic chronic kidney disease: Secondary | ICD-10-CM

## 2015-07-22 DIAGNOSIS — N189 Chronic kidney disease, unspecified: Secondary | ICD-10-CM

## 2015-07-22 DIAGNOSIS — I10 Essential (primary) hypertension: Secondary | ICD-10-CM

## 2015-07-22 NOTE — Patient Instructions (Signed)
Keep up the good work! Do your exercises with the band.

## 2015-07-22 NOTE — Progress Notes (Signed)
  Subjective:    Michelle Buck is a 79 y.o.African American female who presents for follow-up of Type 2 diabetes mellitus.   A1C now at 6.8% Average BG:  130's She is trying to lose weight.  Not skipping meals. No routine exercise.  She is limited due to joint pain and arthritis.  Mobility is limited.  Has peripheral edema She has peripheral neuropathy. Some blurry vision.  Eye exam is due (can't afford it right now). Nocturia 4 times per night.  Review of Systems A comprehensive review of systems was negative except for: Eyes: positive for visual disturbance Cardiovascular: positive for lower extremity edema Genitourinary: positive for nocturia Musculoskeletal: positive for arthralgias and stiff joints Endocrine: positive for diabetic symptoms including blurry vision and increased fatigue    Objective:    BP 130/66 mmHg  Pulse 66  Temp(Src) 98.2 F (36.8 C) (Oral)  Resp 22  Ht 5\' 4"  (1.626 m)  Wt 232 lb 6.4 oz (105.416 kg)  BMI 39.87 kg/m2  SpO2 89%  General:  alert, cooperative and no distress  Oropharynx: normal findings: lips normal without lesions and gums healthy   Eyes:  negative findings: lids and lashes normal and conjunctivae and sclerae normal   Ears:  external ears normal        Lung: clear to auscultation bilaterally  Heart:  regular rate and rhythm     Extremities: edema bilateral lower extremities.  Skin: dry     Neuro: mental status, speech normal, alert and oriented x3   Lab Review GLUCOSE (mg/dL)  Date Value  07/02/2015 143*  03/14/2015 137*  02/19/2015 166*   GLUCOSE, BLD (mg/dL)  Date Value  07/05/2015 130*  10/15/2014 354*  05/13/2014 173*   CO2 (mmol/L)  Date Value  07/05/2015 29  07/02/2015 25  03/14/2015 24   BUN (mg/dL)  Date Value  07/05/2015 23*  07/02/2015 24  03/14/2015 34*  02/19/2015 38*  10/15/2014 49*  05/13/2014 18   CREATININE, SER (mg/dL)  Date Value  07/05/2015 1.91*  07/02/2015 1.75*  03/14/2015 1.80*        Assessment:    Diabetes Mellitus type II, under excellent control.   BP at goal <140/90 Kidney function stable   Plan:    1.  Rx changes: none 2.  Continue 70/30 insulin 40 units morning and 20 units  In the evening. 3.  Counseled on nutrition goals.  Cut portion sizes -but no skipping meals. 4.  Counseled on need for routine exercise.  Recommended band exercise she was taught in physical therapy. 5. HTN at goal.

## 2015-07-23 ENCOUNTER — Other Ambulatory Visit: Payer: Self-pay | Admitting: Internal Medicine

## 2015-07-23 ENCOUNTER — Other Ambulatory Visit: Payer: Self-pay | Admitting: Nurse Practitioner

## 2015-08-12 ENCOUNTER — Emergency Department (HOSPITAL_COMMUNITY): Payer: Medicare Other

## 2015-08-12 ENCOUNTER — Inpatient Hospital Stay (HOSPITAL_COMMUNITY)
Admission: EM | Admit: 2015-08-12 | Discharge: 2015-08-17 | DRG: 291 | Disposition: A | Discharge status: Home Health | Payer: Medicare Other | Attending: Internal Medicine | Admitting: Internal Medicine

## 2015-08-12 ENCOUNTER — Inpatient Hospital Stay (HOSPITAL_COMMUNITY): Payer: Medicare Other

## 2015-08-12 ENCOUNTER — Encounter (HOSPITAL_COMMUNITY): Payer: Self-pay | Admitting: *Deleted

## 2015-08-12 DIAGNOSIS — N183 Chronic kidney disease, stage 3 (moderate): Secondary | ICD-10-CM | POA: Diagnosis present

## 2015-08-12 DIAGNOSIS — Z885 Allergy status to narcotic agent status: Secondary | ICD-10-CM | POA: Diagnosis not present

## 2015-08-12 DIAGNOSIS — E1165 Type 2 diabetes mellitus with hyperglycemia: Secondary | ICD-10-CM | POA: Diagnosis present

## 2015-08-12 DIAGNOSIS — N170 Acute kidney failure with tubular necrosis: Secondary | ICD-10-CM | POA: Diagnosis present

## 2015-08-12 DIAGNOSIS — I5033 Acute on chronic diastolic (congestive) heart failure: Secondary | ICD-10-CM | POA: Diagnosis not present

## 2015-08-12 DIAGNOSIS — Z79899 Other long term (current) drug therapy: Secondary | ICD-10-CM | POA: Diagnosis not present

## 2015-08-12 DIAGNOSIS — J440 Chronic obstructive pulmonary disease with acute lower respiratory infection: Secondary | ICD-10-CM | POA: Diagnosis present

## 2015-08-12 DIAGNOSIS — R0602 Shortness of breath: Secondary | ICD-10-CM | POA: Diagnosis not present

## 2015-08-12 DIAGNOSIS — Z7982 Long term (current) use of aspirin: Secondary | ICD-10-CM | POA: Diagnosis not present

## 2015-08-12 DIAGNOSIS — J209 Acute bronchitis, unspecified: Secondary | ICD-10-CM | POA: Diagnosis present

## 2015-08-12 DIAGNOSIS — I509 Heart failure, unspecified: Secondary | ICD-10-CM | POA: Diagnosis not present

## 2015-08-12 DIAGNOSIS — E785 Hyperlipidemia, unspecified: Secondary | ICD-10-CM | POA: Diagnosis present

## 2015-08-12 DIAGNOSIS — M419 Scoliosis, unspecified: Secondary | ICD-10-CM | POA: Diagnosis present

## 2015-08-12 DIAGNOSIS — R0789 Other chest pain: Secondary | ICD-10-CM | POA: Diagnosis not present

## 2015-08-12 DIAGNOSIS — E1143 Type 2 diabetes mellitus with diabetic autonomic (poly)neuropathy: Secondary | ICD-10-CM | POA: Diagnosis present

## 2015-08-12 DIAGNOSIS — Z823 Family history of stroke: Secondary | ICD-10-CM

## 2015-08-12 DIAGNOSIS — E669 Obesity, unspecified: Secondary | ICD-10-CM

## 2015-08-12 DIAGNOSIS — E119 Type 2 diabetes mellitus without complications: Secondary | ICD-10-CM | POA: Diagnosis not present

## 2015-08-12 DIAGNOSIS — I10 Essential (primary) hypertension: Secondary | ICD-10-CM | POA: Diagnosis present

## 2015-08-12 DIAGNOSIS — Z833 Family history of diabetes mellitus: Secondary | ICD-10-CM | POA: Diagnosis not present

## 2015-08-12 DIAGNOSIS — Z87891 Personal history of nicotine dependence: Secondary | ICD-10-CM | POA: Diagnosis not present

## 2015-08-12 DIAGNOSIS — Z8249 Family history of ischemic heart disease and other diseases of the circulatory system: Secondary | ICD-10-CM | POA: Diagnosis not present

## 2015-08-12 DIAGNOSIS — E1169 Type 2 diabetes mellitus with other specified complication: Secondary | ICD-10-CM

## 2015-08-12 DIAGNOSIS — R609 Edema, unspecified: Secondary | ICD-10-CM

## 2015-08-12 DIAGNOSIS — N179 Acute kidney failure, unspecified: Secondary | ICD-10-CM | POA: Diagnosis not present

## 2015-08-12 DIAGNOSIS — K219 Gastro-esophageal reflux disease without esophagitis: Secondary | ICD-10-CM | POA: Diagnosis present

## 2015-08-12 DIAGNOSIS — T380X5A Adverse effect of glucocorticoids and synthetic analogues, initial encounter: Secondary | ICD-10-CM | POA: Diagnosis present

## 2015-08-12 DIAGNOSIS — H409 Unspecified glaucoma: Secondary | ICD-10-CM | POA: Diagnosis present

## 2015-08-12 DIAGNOSIS — Z96643 Presence of artificial hip joint, bilateral: Secondary | ICD-10-CM | POA: Diagnosis present

## 2015-08-12 DIAGNOSIS — E872 Acidosis: Secondary | ICD-10-CM | POA: Diagnosis present

## 2015-08-12 DIAGNOSIS — F411 Generalized anxiety disorder: Secondary | ICD-10-CM | POA: Diagnosis present

## 2015-08-12 DIAGNOSIS — Z6841 Body Mass Index (BMI) 40.0 and over, adult: Secondary | ICD-10-CM | POA: Diagnosis not present

## 2015-08-12 DIAGNOSIS — J9601 Acute respiratory failure with hypoxia: Secondary | ICD-10-CM | POA: Diagnosis not present

## 2015-08-12 DIAGNOSIS — I129 Hypertensive chronic kidney disease with stage 1 through stage 4 chronic kidney disease, or unspecified chronic kidney disease: Secondary | ICD-10-CM | POA: Diagnosis present

## 2015-08-12 DIAGNOSIS — M6281 Muscle weakness (generalized): Secondary | ICD-10-CM | POA: Diagnosis not present

## 2015-08-12 DIAGNOSIS — Z66 Do not resuscitate: Secondary | ICD-10-CM | POA: Diagnosis present

## 2015-08-12 DIAGNOSIS — Z794 Long term (current) use of insulin: Secondary | ICD-10-CM | POA: Diagnosis not present

## 2015-08-12 DIAGNOSIS — J9602 Acute respiratory failure with hypercapnia: Secondary | ICD-10-CM | POA: Diagnosis present

## 2015-08-12 DIAGNOSIS — I739 Peripheral vascular disease, unspecified: Secondary | ICD-10-CM | POA: Diagnosis present

## 2015-08-12 DIAGNOSIS — E1122 Type 2 diabetes mellitus with diabetic chronic kidney disease: Secondary | ICD-10-CM | POA: Diagnosis present

## 2015-08-12 DIAGNOSIS — D631 Anemia in chronic kidney disease: Secondary | ICD-10-CM | POA: Diagnosis present

## 2015-08-12 DIAGNOSIS — J441 Chronic obstructive pulmonary disease with (acute) exacerbation: Secondary | ICD-10-CM | POA: Diagnosis present

## 2015-08-12 DIAGNOSIS — Z880 Allergy status to penicillin: Secondary | ICD-10-CM | POA: Diagnosis not present

## 2015-08-12 DIAGNOSIS — R531 Weakness: Secondary | ICD-10-CM | POA: Diagnosis not present

## 2015-08-12 DIAGNOSIS — M179 Osteoarthritis of knee, unspecified: Secondary | ICD-10-CM | POA: Diagnosis present

## 2015-08-12 DIAGNOSIS — Z888 Allergy status to other drugs, medicaments and biological substances status: Secondary | ICD-10-CM | POA: Diagnosis not present

## 2015-08-12 LAB — CBC WITH DIFFERENTIAL/PLATELET
BASOS ABS: 0 10*3/uL (ref 0.0–0.1)
BASOS PCT: 0 % (ref 0–1)
EOS ABS: 0.1 10*3/uL (ref 0.0–0.7)
Eosinophils Relative: 1 % (ref 0–5)
HCT: 32.6 % — ABNORMAL LOW (ref 36.0–46.0)
Hemoglobin: 10.2 g/dL — ABNORMAL LOW (ref 12.0–15.0)
LYMPHS ABS: 1.1 10*3/uL (ref 0.7–4.0)
Lymphocytes Relative: 11 % — ABNORMAL LOW (ref 12–46)
MCH: 26.8 pg (ref 26.0–34.0)
MCHC: 31.3 g/dL (ref 30.0–36.0)
MCV: 85.6 fL (ref 78.0–100.0)
Monocytes Absolute: 0.6 10*3/uL (ref 0.1–1.0)
Monocytes Relative: 6 % (ref 3–12)
NEUTROS ABS: 8 10*3/uL — AB (ref 1.7–7.7)
Neutrophils Relative %: 82 % — ABNORMAL HIGH (ref 43–77)
Platelets: UNDETERMINED 10*3/uL (ref 150–400)
RBC: 3.81 MIL/uL — ABNORMAL LOW (ref 3.87–5.11)
RDW: 16.2 % — AB (ref 11.5–15.5)
WBC: 9.8 10*3/uL (ref 4.0–10.5)

## 2015-08-12 LAB — GLUCOSE, CAPILLARY
GLUCOSE-CAPILLARY: 394 mg/dL — AB (ref 65–99)
Glucose-Capillary: 236 mg/dL — ABNORMAL HIGH (ref 65–99)
Glucose-Capillary: 183 mg/dL — ABNORMAL HIGH (ref 65–99)

## 2015-08-12 LAB — BASIC METABOLIC PANEL
Anion gap: 12 (ref 5–15)
BUN: 30 mg/dL — AB (ref 6–20)
CALCIUM: 9.5 mg/dL (ref 8.9–10.3)
CO2: 24 mmol/L (ref 22–32)
Chloride: 107 mmol/L (ref 101–111)
Creatinine, Ser: 2.01 mg/dL — ABNORMAL HIGH (ref 0.44–1.00)
GFR calc non Af Amer: 23 mL/min — ABNORMAL LOW (ref >60)
GFR, EST AFRICAN AMERICAN: 26 mL/min — AB (ref >60)
Glucose, Bld: 252 mg/dL — ABNORMAL HIGH (ref 65–99)
Potassium: 4 mmol/L (ref 3.5–5.1)
SODIUM: 143 mmol/L (ref 135–145)

## 2015-08-12 LAB — BLOOD GAS, ARTERIAL
ACID-BASE DEFICIT: 2.6 mmol/L — AB (ref 0.0–2.0)
BICARBONATE: 23.5 meq/L (ref 20.0–24.0)
DRAWN BY: 422461
FIO2: 1
O2 CONTENT: 15 L/min
O2 Saturation: 99.3 %
Patient temperature: 98.3
TCO2: 22.4 mmol/L (ref 0–100)
pCO2 arterial: 50.1 mmHg — ABNORMAL HIGH (ref 35.0–45.0)
pH, Arterial: 7.293 — ABNORMAL LOW (ref 7.350–7.450)
pO2, Arterial: 249 mmHg — ABNORMAL HIGH (ref 80.0–100.0)

## 2015-08-12 LAB — I-STAT TROPONIN, ED: TROPONIN I, POC: 0.04 ng/mL (ref 0.00–0.08)

## 2015-08-12 LAB — MRSA PCR SCREENING: MRSA by PCR: NEGATIVE

## 2015-08-12 LAB — BRAIN NATRIURETIC PEPTIDE: B Natriuretic Peptide: 149.2 pg/mL — ABNORMAL HIGH (ref 0.0–100.0)

## 2015-08-12 MED ORDER — SIMVASTATIN 20 MG PO TABS
20.0000 mg | ORAL_TABLET | Freq: Every day | ORAL | Status: DC
  Administered 2015-08-12 – 2015-08-17 (×6): 20 mg via ORAL
  Filled 2015-08-12 (×2): qty 1
  Filled 2015-08-12: qty 2
  Filled 2015-08-12 (×3): qty 1

## 2015-08-12 MED ORDER — GUAIFENESIN 100 MG/5ML PO SOLN
100.0000 mg | ORAL | Status: DC | PRN
  Administered 2015-08-14 (×2): 200 mg via ORAL
  Filled 2015-08-12 (×3): qty 10

## 2015-08-12 MED ORDER — IPRATROPIUM-ALBUTEROL 0.5-2.5 (3) MG/3ML IN SOLN
3.0000 mL | Freq: Four times a day (QID) | RESPIRATORY_TRACT | Status: DC
  Administered 2015-08-12 – 2015-08-17 (×20): 3 mL via RESPIRATORY_TRACT
  Filled 2015-08-12 (×20): qty 3

## 2015-08-12 MED ORDER — INSULIN NPH (HUMAN) (ISOPHANE) 100 UNIT/ML ~~LOC~~ SUSP
20.0000 [IU] | Freq: Every day | SUBCUTANEOUS | Status: DC
  Administered 2015-08-12: 20 [IU] via SUBCUTANEOUS
  Filled 2015-08-12: qty 10

## 2015-08-12 MED ORDER — INSULIN ASPART 100 UNIT/ML ~~LOC~~ SOLN
0.0000 [IU] | Freq: Every day | SUBCUTANEOUS | Status: DC
  Administered 2015-08-13 – 2015-08-14 (×2): 2 [IU] via SUBCUTANEOUS

## 2015-08-12 MED ORDER — GABAPENTIN 100 MG PO CAPS
200.0000 mg | ORAL_CAPSULE | Freq: Three times a day (TID) | ORAL | Status: DC
  Administered 2015-08-12 – 2015-08-17 (×16): 200 mg via ORAL
  Filled 2015-08-12 (×18): qty 2

## 2015-08-12 MED ORDER — LUBIPROSTONE 24 MCG PO CAPS
24.0000 ug | ORAL_CAPSULE | Freq: Two times a day (BID) | ORAL | Status: DC
  Administered 2015-08-12 – 2015-08-17 (×10): 24 ug via ORAL
  Filled 2015-08-12 (×11): qty 1

## 2015-08-12 MED ORDER — ALBUTEROL (5 MG/ML) CONTINUOUS INHALATION SOLN
10.0000 mg/h | INHALATION_SOLUTION | Freq: Once | RESPIRATORY_TRACT | Status: AC
  Administered 2015-08-12: 10 mg/h via RESPIRATORY_TRACT
  Filled 2015-08-12: qty 20

## 2015-08-12 MED ORDER — CETYLPYRIDINIUM CHLORIDE 0.05 % MT LIQD
7.0000 mL | Freq: Two times a day (BID) | OROMUCOSAL | Status: DC
  Administered 2015-08-12 – 2015-08-17 (×10): 7 mL via OROMUCOSAL

## 2015-08-12 MED ORDER — BUDESONIDE 0.25 MG/2ML IN SUSP
0.2500 mg | Freq: Two times a day (BID) | RESPIRATORY_TRACT | Status: DC
  Administered 2015-08-12 – 2015-08-17 (×11): 0.25 mg via RESPIRATORY_TRACT
  Filled 2015-08-12 (×11): qty 2

## 2015-08-12 MED ORDER — ALBUTEROL SULFATE (2.5 MG/3ML) 0.083% IN NEBU
5.0000 mg | INHALATION_SOLUTION | Freq: Once | RESPIRATORY_TRACT | Status: AC
  Administered 2015-08-12: 5 mg via RESPIRATORY_TRACT
  Filled 2015-08-12: qty 6

## 2015-08-12 MED ORDER — METHYLPREDNISOLONE SODIUM SUCC 125 MG IJ SOLR
60.0000 mg | Freq: Three times a day (TID) | INTRAMUSCULAR | Status: DC
  Administered 2015-08-12 – 2015-08-16 (×12): 60 mg via INTRAVENOUS
  Filled 2015-08-12 (×5): qty 0.96
  Filled 2015-08-12: qty 2
  Filled 2015-08-12 (×7): qty 0.96

## 2015-08-12 MED ORDER — INSULIN NPH (HUMAN) (ISOPHANE) 100 UNIT/ML ~~LOC~~ SUSP
25.0000 [IU] | Freq: Every day | SUBCUTANEOUS | Status: DC
  Administered 2015-08-13: 25 [IU] via SUBCUTANEOUS
  Filled 2015-08-12: qty 10

## 2015-08-12 MED ORDER — SERTRALINE HCL 50 MG PO TABS
50.0000 mg | ORAL_TABLET | Freq: Every day | ORAL | Status: DC
  Administered 2015-08-12 – 2015-08-17 (×6): 50 mg via ORAL
  Filled 2015-08-12 (×6): qty 1

## 2015-08-12 MED ORDER — FUROSEMIDE 10 MG/ML IJ SOLN
40.0000 mg | INTRAMUSCULAR | Status: AC
  Administered 2015-08-12: 40 mg via INTRAVENOUS
  Filled 2015-08-12: qty 4

## 2015-08-12 MED ORDER — HYDRALAZINE HCL 20 MG/ML IJ SOLN: 10.0000 mg | INTRAMUSCULAR | Status: DC | PRN

## 2015-08-12 MED ORDER — HYDRALAZINE HCL 50 MG PO TABS
50.0000 mg | ORAL_TABLET | Freq: Two times a day (BID) | ORAL | Status: DC
  Administered 2015-08-12 – 2015-08-17 (×11): 50 mg via ORAL
  Filled 2015-08-12 (×11): qty 1

## 2015-08-12 MED ORDER — SODIUM CHLORIDE 0.9 % IJ SOLN
3.0000 mL | Freq: Two times a day (BID) | INTRAMUSCULAR | Status: DC
  Administered 2015-08-12 – 2015-08-17 (×11): 3 mL via INTRAVENOUS

## 2015-08-12 MED ORDER — CILOSTAZOL 100 MG PO TABS
100.0000 mg | ORAL_TABLET | Freq: Every day | ORAL | Status: DC
  Administered 2015-08-12 – 2015-08-17 (×6): 100 mg via ORAL
  Filled 2015-08-12 (×6): qty 1

## 2015-08-12 MED ORDER — CYCLOSPORINE 0.05 % OP EMUL
1.0000 [drp] | Freq: Two times a day (BID) | OPHTHALMIC | Status: DC
  Administered 2015-08-12 – 2015-08-17 (×11): 1 [drp] via OPHTHALMIC
  Filled 2015-08-12 (×11): qty 1

## 2015-08-12 MED ORDER — LATANOPROST 0.005 % OP SOLN
1.0000 [drp] | Freq: Every day | OPHTHALMIC | Status: DC
  Administered 2015-08-12 – 2015-08-16 (×5): 1 [drp] via OPHTHALMIC
  Filled 2015-08-12: qty 2.5

## 2015-08-12 MED ORDER — TIMOLOL MALEATE 0.5 % OP SOLN
1.0000 [drp] | Freq: Two times a day (BID) | OPHTHALMIC | Status: DC
  Administered 2015-08-12 – 2015-08-16 (×9): 1 [drp] via OPHTHALMIC
  Filled 2015-08-12 (×2): qty 5

## 2015-08-12 MED ORDER — INSULIN NPH (HUMAN) (ISOPHANE) 100 UNIT/ML ~~LOC~~ SUSP
15.0000 [IU] | Freq: Every day | SUBCUTANEOUS | Status: DC
  Administered 2015-08-12 – 2015-08-16 (×5): 15 [IU] via SUBCUTANEOUS
  Filled 2015-08-12: qty 10

## 2015-08-12 MED ORDER — INSULIN ASPART 100 UNIT/ML ~~LOC~~ SOLN
0.0000 [IU] | Freq: Three times a day (TID) | SUBCUTANEOUS | Status: DC
  Administered 2015-08-12: 3 [IU] via SUBCUTANEOUS
  Administered 2015-08-12: 9 [IU] via SUBCUTANEOUS
  Administered 2015-08-13: 3 [IU] via SUBCUTANEOUS
  Administered 2015-08-13: 2 [IU] via SUBCUTANEOUS
  Administered 2015-08-13: 3 [IU] via SUBCUTANEOUS
  Administered 2015-08-14: 2 [IU] via SUBCUTANEOUS
  Administered 2015-08-14 (×2): 3 [IU] via SUBCUTANEOUS
  Administered 2015-08-15: 2 [IU] via SUBCUTANEOUS
  Administered 2015-08-15: 3 [IU] via SUBCUTANEOUS
  Administered 2015-08-15: 5 [IU] via SUBCUTANEOUS
  Administered 2015-08-16 (×2): 2 [IU] via SUBCUTANEOUS
  Administered 2015-08-16: 1 [IU] via SUBCUTANEOUS
  Administered 2015-08-17: 2 [IU] via SUBCUTANEOUS

## 2015-08-12 MED ORDER — HYDROCODONE-ACETAMINOPHEN 10-325 MG PO TABS
1.0000 | ORAL_TABLET | Freq: Three times a day (TID) | ORAL | Status: DC | PRN
  Administered 2015-08-12 – 2015-08-17 (×10): 1 via ORAL
  Filled 2015-08-12 (×11): qty 1

## 2015-08-12 MED ORDER — HEPARIN SODIUM (PORCINE) 5000 UNIT/ML IJ SOLN
5000.0000 [IU] | Freq: Three times a day (TID) | INTRAMUSCULAR | Status: DC
  Administered 2015-08-12 – 2015-08-17 (×15): 5000 [IU] via SUBCUTANEOUS
  Filled 2015-08-12 (×19): qty 1

## 2015-08-12 MED ORDER — METOPROLOL SUCCINATE ER 25 MG PO TB24
25.0000 mg | ORAL_TABLET | Freq: Every day | ORAL | Status: DC
  Administered 2015-08-12 – 2015-08-17 (×6): 25 mg via ORAL
  Filled 2015-08-12 (×6): qty 1

## 2015-08-12 MED ORDER — LEVOFLOXACIN IN D5W 500 MG/100ML IV SOLN
500.0000 mg | INTRAVENOUS | Status: DC
  Administered 2015-08-12 – 2015-08-13 (×2): 500 mg via INTRAVENOUS
  Filled 2015-08-12 (×2): qty 100

## 2015-08-12 MED ORDER — INSULIN NPH (HUMAN) (ISOPHANE) 100 UNIT/ML ~~LOC~~ SUSP
10.0000 [IU] | Freq: Every day | SUBCUTANEOUS | Status: DC
  Filled 2015-08-12: qty 10

## 2015-08-12 MED ORDER — SUCRALFATE 1 G PO TABS
1.0000 g | ORAL_TABLET | Freq: Two times a day (BID) | ORAL | Status: DC
  Administered 2015-08-12 – 2015-08-17 (×11): 1 g via ORAL
  Filled 2015-08-12 (×11): qty 1

## 2015-08-12 MED ORDER — DULOXETINE HCL 30 MG PO CPEP
30.0000 mg | ORAL_CAPSULE | Freq: Every day | ORAL | Status: DC
  Administered 2015-08-12 – 2015-08-17 (×6): 30 mg via ORAL
  Filled 2015-08-12 (×6): qty 1

## 2015-08-12 MED ORDER — ASPIRIN 81 MG PO CHEW
81.0000 mg | CHEWABLE_TABLET | Freq: Every day | ORAL | Status: DC
  Administered 2015-08-12 – 2015-08-17 (×6): 81 mg via ORAL
  Filled 2015-08-12 (×6): qty 1

## 2015-08-12 MED ORDER — IPRATROPIUM BROMIDE 0.02 % IN SOLN
0.5000 mg | Freq: Once | RESPIRATORY_TRACT | Status: AC
  Administered 2015-08-12: 0.5 mg via RESPIRATORY_TRACT
  Filled 2015-08-12: qty 2.5

## 2015-08-12 MED ORDER — METHYLPREDNISOLONE SODIUM SUCC 125 MG IJ SOLR
125.0000 mg | Freq: Once | INTRAMUSCULAR | Status: AC
  Administered 2015-08-12: 125 mg via INTRAVENOUS
  Filled 2015-08-12: qty 2

## 2015-08-12 MED ORDER — CLONIDINE HCL 0.1 MG PO TABS
0.1000 mg | ORAL_TABLET | Freq: Every day | ORAL | Status: DC
  Administered 2015-08-12 – 2015-08-17 (×6): 0.1 mg via ORAL
  Filled 2015-08-12 (×6): qty 1

## 2015-08-12 NOTE — ED Notes (Signed)
Respiratory called to bedside.

## 2015-08-12 NOTE — ED Notes (Signed)
Respiratory therapist at bedside.

## 2015-08-12 NOTE — ED Notes (Signed)
Per EMS pt coming from home with c/o increased shortness of breath x 3 days, as well as feeling weak. Per EMS O2 sats initially 75%on RA, placed on NRB-O2 98%.

## 2015-08-12 NOTE — ED Provider Notes (Signed)
CSN: [id number]     Arrival date & time 08/12/15  0423 History   First MD Initiated Contact with Patient 08/12/15 [phone]     Chief Complaint  Patient presents with  . Shortness of Breath    (Consider location/radiation/quality/duration/timing/severity/associated sxs/prior Treatment) HPI Comments: 79 year old female with a history of diabetes mellitus, hypertension, obesity, COPD, CHF, and dyslipidemia presents to the emergency department for further evaluation of shortness of breath. Patient states that symptoms have been present over the past 3 days. They have been worsening since onset. Patient reports feeling very weak today which prompted her call to EMS. EMS reports that patient with oxygen saturations of 75% on room air which improved to 98% on a nonrebreather. Patient denies a chronic oxygen requirement. She states that she has been compliant with her Advair and albuterol treatments at home, but they have been providing her little relief. Patient reports some mild worsening to her bilateral lower extremity edema. She has been taking her torsemide as prescribed. Patient denies any associated fever or sick contacts. She denies syncope, chest pain, and abdominal pain.   PCP - Dr. Dewaine Oats at [facility] Patient lives in an apartment with her brother  Patient is a 79 y.o. female presenting with shortness of breath. The history is provided by the patient. No language interpreter was used.  Shortness of Breath Associated symptoms: vomiting (x1 two days ago) and wheezing   Associated symptoms: no abdominal pain, no chest pain and no fever     Past Medical History  Diagnosis Date  . Diabetes mellitus   . Hypertension   . Obese   . Hiatal hernia   . Scoliosis   . Arthritis   . Acute bronchitis   . COPD (chronic obstructive pulmonary disease)   . Unspecified hereditary and idiopathic peripheral neuropathy   . Secondary diabetes mellitus with renal manifestations, not stated as  uncontrolled, or unspecified   . Chronic kidney disease, stage II (mild)   . Chronic kidney disease, stage II (mild)   . Chronic kidney disease, unspecified   . Disorder of bone and cartilage, unspecified   . Complications affecting other specified body systems, hypertension   . Hypopotassemia   . Anxiety   . Congestive heart failure, unspecified   . GERD (gastroesophageal reflux disease)   . Lumbago   . Insomnia, unspecified   . Hyperlipidemia   . Unspecified glaucoma   . Diaphragmatic hernia without mention of obstruction or gangrene   . Osteoarthrosis, unspecified whether generalized or localized, unspecified site   . Degenerative arthritis of knee, bilateral    Past Surgical History  Procedure Laterality Date  . Abdominal hysterectomy    . Shoulder surgery    . Total hip arthroplasty      bilateral  . Knee arthroscopy      bilateral  . Colonoscopy  08/06/2011  . Foot surgery     Family History  Problem Relation Age of Onset  . Diabetes Mother   . Heart disease Mother   . Heart disease Brother   . Diabetes Brother   . Kidney disease Father   . Scoliosis Sister   . Diabetes Brother   . Diabetes Brother   . Heart disease Brother   . Scoliosis Brother   . Stroke Brother   . Heart attack Brother    Social History  Substance Use Topics  . Smoking status: Former Smoker    Types: Cigarettes    Quit date: 02/27/1997  . Smokeless tobacco:  Never Used  . Alcohol Use: No   OB History    No data available      Review of Systems  Constitutional: Positive for fatigue. Negative for fever.  Respiratory: Positive for chest tightness, shortness of breath and wheezing.   Cardiovascular: Positive for leg swelling (mild). Negative for chest pain.  Gastrointestinal: Positive for vomiting (x1 two days ago). Negative for abdominal pain.  Neurological: Positive for weakness (generalized). Negative for syncope.  All other systems reviewed and are negative.   Allergies   Tramadol; Codeine; Hydrocodone; Oxycodone; Tylenol; and Penicillins  Home Medications   Prior to Admission medications   Medication Sig Start Date End Date Taking? Authorizing Provider  ADVAIR DISKUS 250-50 MCG/DOSE AEPB INHALE 1 PUFF BY MOUTH INTO THE LUNGS TWICE DAILY 04/04/15  Yes [name], NP  albuterol (PROVENTIL HFA;VENTOLIN HFA) 108 (90 BASE) MCG/ACT inhaler Inhale 1 puff into the lungs every 6 (six) hours as needed for wheezing or shortness of breath.   Yes Historical Provider, MD  aspirin 81 MG tablet Take 81 mg by mouth daily.     Yes Historical Provider, MD  cilostazol (PLETAL) 100 MG tablet Take 100 mg by mouth daily.   Yes Historical Provider, MD  cloNIDine (CATAPRES) 0.1 MG tablet Take 0.1 mg by mouth daily.   Yes Historical Provider, MD  cycloSPORINE (RESTASIS) 0.05 % ophthalmic emulsion 1 drop 2 (two) times daily. One drop once a day bilaterally for dry eyes   Yes Historical Provider, MD  diclofenac sodium (VOLTAREN) 1 % GEL Apply 4 g topically 2 (two) times daily as needed. For pain   Yes Historical Provider, MD  DULoxetine (CYMBALTA) 30 MG capsule Take 30 mg by mouth daily.   Yes Historical Provider, MD  enalapril (VASOTEC) 10 MG tablet One daily to control BP and to strengthen the heart Patient taking differently: Take 10 mg by mouth daily.  01/29/15  Yes Estill Dooms, MD  esomeprazole (NEXIUM) 40 MG capsule Take 1 capsule (40 mg total) by mouth daily. 07/16/15  Yes [name], NP  gabapentin (NEURONTIN) 100 MG capsule Take 2 capsules (200 mg total) by mouth 3 (three) times daily. 05/30/15  Yes [name], NP  hydrALAZINE (APRESOLINE) 50 MG tablet Take 50 mg by mouth 2 (two) times daily.   Yes Historical Provider, MD  insulin NPH-regular Human (HUMULIN 70/30) (70-30) 100 UNIT/ML injection Inject 40 units subcutaneously with breakfast and 20 units with dinner to control blood sugar. Patient taking differently: Inject 20-40 Units into the skin 2 (two) times  daily with a meal. Inject 40 units subcutaneously with breakfast and 20 units with dinner to control blood sugar. 02/01/15  Yes Estill Dooms, MD  lubiprostone (AMITIZA) 24 MCG capsule Take 1 capsule (24 mcg total) by mouth 2 (two) times daily with a meal. 05/30/15  Yes [name], NP  metoprolol succinate (TOPROL-XL) 25 MG 24 hr tablet Take 25 mg by mouth daily.   Yes Historical Provider, MD  potassium chloride SA (K-DUR,KLOR-CON) 20 MEQ tablet Take 20 mEq by mouth daily.   Yes Historical Provider, MD  sertraline (ZOLOFT) 50 MG tablet Take 50 mg by mouth daily.   Yes Historical Provider, MD  simvastatin (ZOCOR) 20 MG tablet Take 20 mg by mouth daily.   Yes Historical Provider, MD  sucralfate (CARAFATE) 1 G tablet Take 1 g by mouth 2 (two) times daily.   Yes Historical Provider, MD  timolol (TIMOPTIC) 0.5 % ophthalmic solution Place  1 drop into both eyes 2 (two) times daily. One drop in bilaterally twice a day as needed   Yes Historical Provider, MD  torsemide (DEMADEX) 20 MG tablet Take 1 tablet in morning only to help with leg edema Patient taking differently: Take 20 mg by mouth daily. Take 1 tablet in morning only to help with leg edema 02/21/15  Yes Mahima Pandey, MD  Travoprost, BAK Free, (TRAVATAN) 0.004 % SOLN ophthalmic solution Place 1 drop into both eyes at bedtime. One drop bilaterally twice a day   Yes Historical Provider, MD  cilostazol (PLETAL) 100 MG tablet TAKE 1 TABLET BY MOUTH EVERY DAY Patient not taking: Reported on 08/12/2015 07/24/15   [name], NP  cloNIDine (CATAPRES) 0.1 MG tablet TAKE 1 TABLET BY MOUTH EVERY DAY Patient not taking: Reported on 08/12/2015 02/07/15   Blanchie Serve, MD  DULoxetine (CYMBALTA) 30 MG capsule TAKE 1 CAPSULE BY MOUTH EVERY DAY TO HELP ANXIETY AND PAINS Patient not taking: Reported on 08/12/2015 07/24/15   [name], NP  EASY [facility] INSULIN SYRINGE 31G X 5/16" 0.5 ML MISC USE UP TO 2 TIMES A DAY AS DIRECTED 05/29/15   [name], NP  guaifenesin (ROBITUSSIN) 100 MG/5ML syrup Take 5-10 mLs (100-200 mg total) by mouth every 4 (four) hours as needed for cough. 10/15/14   Milton Ferguson, MD  hydrALAZINE (APRESOLINE) 50 MG tablet TAKE 1 TABLET BY MOUTH TWICE DAILY Patient not taking: Reported on 08/12/2015 06/26/15   [name], NP  HYDROcodone-acetaminophen (NORCO) 10-325 MG per tablet Take 1 tablet by mouth every 8 (eight) hours as needed for moderate pain.  01/03/14   Historical Provider, MD  meloxicam (MOBIC) 15 MG tablet TAKE 1 TABLET BY MOUTH EVERY DAY Patient not taking: Reported on 08/12/2015 07/24/15   [name], NP  metoprolol succinate (TOPROL-XL) 25 MG 24 hr tablet TAKE 1 TABLET BY MOUTH EVERY DAY WITH OR IMMEDIATELY FOLLOWING A MEAL FOR BLOOD PRESSURE Patient not taking: Reported on 08/12/2015 07/24/15   [name], NP  potassium chloride SA (K-DUR,KLOR-CON) 20 MEQ tablet TAKE 1 TABLET BY MOUTH EVERY DAY Patient not taking: Reported on 08/12/2015 06/26/15   [name], NP  sertraline (ZOLOFT) 50 MG tablet TAKE 1 TABLET BY MOUTH EVERY DAY Patient not taking: Reported on 08/12/2015 07/24/15   [name], NP  simvastatin (ZOCOR) 20 MG tablet TAKE 1 TABLET BY MOUTH EVERY DAY Patient not taking: Reported on 08/12/2015 07/24/15   [name], NP  sucralfate (CARAFATE) 1 G tablet Take 1 g by mouth 2 (two) times daily.    Historical Provider, MD  sucralfate (CARAFATE) 1 G tablet TAKE 1 TABLET BY MOUTH TWICE DAILY ON EMPTY STOMACH Patient not taking: Reported on 08/12/2015 07/24/15   [name], NP  VENTOLIN HFA 108 (90 BASE) MCG/ACT inhaler INHALE 1 PUFF 4 TIMES A DAY FOR 5 DAYS, THEN USE EVERY 6 HOURS AS NEEDED Patient not taking: Reported on 08/12/2015 04/17/15   Estill Dooms, MD  VOLTAREN 1 % GEL APPLY 4 GRAM TOPICALLY TWICE DAILY Patient not taking: Reported on 08/12/2015 06/26/15   [name], NP   BP 169/53 mmHg  Pulse 88  Temp(Src) 98.3 F (36.8 C) (Oral)  Resp  22  SpO2 99%   Physical Exam  Constitutional: She is oriented to person, place, and time. She appears well-developed and well-nourished. No distress.  Alert, morbidly obese female  HENT:  Head: Normocephalic and atraumatic.  Eyes: Conjunctivae and EOM are normal. No scleral icterus.  Neck: Normal range of motion.  Cardiovascular: Normal rate, regular rhythm and intact distal pulses.   Pulmonary/Chest: Effort normal. No respiratory distress. She has wheezes. She has no rales.  Appreciable expiratory wheezes. Decreased breath sounds noted in all lung fields. Chest expansion symmetric. Oxygen saturations 100% on nonrebreather  Musculoskeletal: Normal range of motion.  Mild edema in b/l lower extremities, no greater than 1+  Neurological: She is alert and oriented to person, place, and time. She exhibits normal muscle tone. Coordination normal.  GCS 15. Patient moving all extremities.  Skin: Skin is warm and dry. No rash noted. She is not diaphoretic. No erythema. No pallor.  Psychiatric: She has a normal mood and affect. Her behavior is normal.  Nursing note and vitals reviewed.   ED Course  Procedures (including critical care time) Labs Review Labs Reviewed  CBC WITH DIFFERENTIAL/PLATELET - Abnormal; Notable for the following:    RBC 3.81 (*)    Hemoglobin 10.2 (*)    HCT 32.6 (*)    RDW 16.2 (*)    Neutrophils Relative % 82 (*)    Lymphocytes Relative 11 (*)    Neutro Abs 8.0 (*)    All other components within normal limits  BASIC METABOLIC PANEL - Abnormal; Notable for the following:    Glucose, Bld 252 (*)    BUN 30 (*)    Creatinine, Ser 2.01 (*)    GFR calc non Af Amer 23 (*)    GFR calc Af Amer 26 (*)    All other components within normal limits  BRAIN NATRIURETIC PEPTIDE - Abnormal; Notable for the following:    B Natriuretic Peptide 149.2 (*)    All other components within normal limits  BLOOD GAS, ARTERIAL - Abnormal; Notable for the following:    pH, Arterial  7.293 (*)    pCO2 arterial 50.1 (*)    pO2, Arterial 249 (*)    Acid-base deficit 2.6 (*)    All other components within normal limits  Randolm Idol, ED    Imaging Review Dg Chest Port 1 View  08/12/2015   CLINICAL DATA:  Shortness of breath.  EXAM: PORTABLE CHEST - 1 VIEW  COMPARISON:  07/05/2015  FINDINGS: The heart is enlarged. Lower lung volumes from prior exam. Increased interstitial prominence, may be related to lower lung volumes versus pulmonary edema. Suspect minimal fluid in the right minor fissure. No large subpulmonic pleural effusion. No pneumothorax. Advanced degenerative change of both shoulders.  IMPRESSION: Cardiomegaly with increased interstitial prominence, may be related to lower lung volumes versus pulmonary edema. Suspect minimal fluid in the right minor fissure.   Electronically Signed   By: Jeb Levering M.D.   On: 08/12/2015 06:12     I have personally reviewed and evaluated these images and lab results as part of my medical decision-making.   EKG Interpretation   Date/Time:  Monday August 12 2015 04:33:01 EDT Ventricular Rate:  95 PR Interval:  129 QRS Duration: 89 QT Interval:  328 QTC Calculation: 412 R Axis:   61 Text Interpretation:  Sinus rhythm Borderline repolarization abnormality  Nonspecific ST and T wave abnormality No significant change since last  tracing Confirmed by Kathrynn Humble, MD, ANKIT [phone]) on 08/12/2015 4:46:23 AM       CRITICAL CARE Performed by: Antonietta Breach   Total critical care time: 40  Critical care time was exclusive of separately billable procedures and treating other patients.  Critical care  was necessary to treat or prevent imminent or life-threatening deterioration.  Critical care was time spent personally by me on the following activities: development of treatment plan with patient and/or surrogate as well as nursing, discussions with consultants, evaluation of patient's response to treatment, examination of  patient, obtaining history from patient or surrogate, ordering and performing treatments and interventions, ordering and review of laboratory studies, ordering and review of radiographic studies, pulse oximetry and re-evaluation of patient's condition.  MDM   Final diagnoses:  Acute respiratory failure with hypoxia    Patient presents to ED for SOB, worsening x 3 days. Patient with hypoxia requiring NRB; she has no hx of chronic O2 requirement. Suspect mixed CHF and COPD picture. Patient given Lasix as well as albuterol and solumedrol. I did discuss BiPAP with the patient; I think she would benefit from this when albuterol is completed. O2 sats 99-100% on 15L. TRH to admit to Step Down.   Filed Vitals:   08/12/15 0423 08/12/15 0429 08/12/15 0518 08/12/15 0536  BP:  185/74  169/53  Pulse:  93  88  Temp:  98.3 F (36.8 C)    TempSrc:  Oral    Resp:  20  22  SpO2: 98% 100% 100% 99%     Antonietta Breach, PA-C 08/12/15 [id number]  Varney Biles, MD 08/12/15 [phone]

## 2015-08-12 NOTE — ED Notes (Signed)
Bed: WA09 Expected date:  Expected time:  Means of arrival:  Comments: EMS, short of breath

## 2015-08-12 NOTE — Care Management Note (Signed)
Case Management Note  Patient Details  Name: Winter Hineman MRN: [id number] Date of Birth: 07/24/1936  Subjective/Objective:       resp failure o2 on arrival of ems 75% on nrb 100% o2             Action/Plan:Date:  Sept. 12, 2016 U.R. performed for needs and level of care. Will continue to follow for Case Management needs.  Velva Harman, RN, BSN, [place]   [phone]   Expected Discharge Date:                  Expected Discharge Plan:  Home/Self Care  In-House Referral:  Clinical Social Work  Discharge planning Services  CM Consult  Post Acute Care Choice:  NA Choice offered to:  NA  DME Arranged:    DME Agency:     HH Arranged:    [place] Agency:     Status of Service:  In process, will continue to follow  Medicare Important Message Given:    Date Medicare IM Given:    Medicare IM give by:    Date Additional Medicare IM Given:    Additional Medicare Important Message give by:     If discussed at [place] of Stay Meetings, dates discussed:    Additional Comments:  Leeroy Cha, RN 08/12/2015, 10:50 AM

## 2015-08-12 NOTE — Progress Notes (Signed)
*  PRELIMINARY RESULTS* Vascular Ultrasound Lower extremity venous duplex has been completed.  Preliminary findings: no obvious DVT noted in visualized veins.  Landry Mellow, RDMS, RVT  08/12/2015, 9:27 AM

## 2015-08-12 NOTE — H&P (Signed)
History and Physical   Michelle Buck [id number] DOB: 07-10-1936 DOA: 08/12/2015  Referring physician: Dr. Laureen Ochs PCP: [name], NP  Specialists: none  Chief Complaint: shortness of breath   HPI: Michelle Buck is a 79 y.o. female with a past medical history significant for COPD, CHF, diabetes mellitus, HTN, obesity, and hyperlipidemia presents to the ED with several days of worsening SOB. States symptoms began over the past 3 days, and that she has had 2 months of a productive cough and a sore throat the past month. Swelling in LE's has become worse per patient. Also c/o feeling shaky and less able to ambulate with her walker. She reports compliants with home medications as her sister is helping her. Of note, patient is a former smoker, quit 19 years ago. She denies abdominal pain, nausea/vomiting/diarrhea however did have one episode of emesis 3 days ago. She endorses chest pain like a band on her lower chest, worse with coughing. She endorses thick sputum production as well. She denies fever but endorses chills. She also complains of increased LE swelling right more than left.   In the ED, she was found to be hypoxic requiring NRB mask, Cr 2.0, elevated BNP to 149. CXR with potential pulmonary edema. She was given Lasix, steroids and breathing treatment and TRH was asked for admission for COPD exacerbation vs acute on chronic heart failure.   Review of Systems: as per HPI otherwise 10 point ROS negative.    Past Medical History  Diagnosis Date  . Diabetes mellitus   . Hypertension   . Obese   . Hiatal hernia   . Scoliosis   . Arthritis   . Acute bronchitis   . COPD (chronic obstructive pulmonary disease)   . Unspecified hereditary and idiopathic peripheral neuropathy   . Secondary diabetes mellitus with renal manifestations, not stated as uncontrolled, or unspecified   . Chronic kidney disease, stage II (mild)   . Chronic kidney disease, stage II (mild)   . Chronic kidney  disease, unspecified   . Disorder of bone and cartilage, unspecified   . Complications affecting other specified body systems, hypertension   . Hypopotassemia   . Anxiety   . Congestive heart failure, unspecified   . GERD (gastroesophageal reflux disease)   . Lumbago   . Insomnia, unspecified   . Hyperlipidemia   . Unspecified glaucoma   . Diaphragmatic hernia without mention of obstruction or gangrene   . Osteoarthrosis, unspecified whether generalized or localized, unspecified site   . Degenerative arthritis of knee, bilateral    Past Surgical History  Procedure Laterality Date  . Abdominal hysterectomy    . Shoulder surgery    . Total hip arthroplasty      bilateral  . Knee arthroscopy      bilateral  . Colonoscopy  08/06/2011  . Foot surgery     Social History:  reports that she quit smoking about 18 years ago. Her smoking use included Cigarettes. She has never used smokeless tobacco. She reports that she does not drink alcohol or use illicit drugs.  Allergies  Allergen Reactions  . Tramadol Other (See Comments)    Leg cramps   . Codeine Nausea And Vomiting    Patient states N/V with codeine  . Hydrocodone Nausea And Vomiting  . Oxycodone Other (See Comments)    Patient states she can tolerate oxycodone  . Tylenol [Acetaminophen] Hives  . Penicillins Rash    Patient states rash/itch with penicillin    Family History  Problem Relation Age of Onset  . Diabetes Mother   . Heart disease Mother   . Heart disease Brother   . Diabetes Brother   . Kidney disease Father   . Scoliosis Sister   . Diabetes Brother   . Diabetes Brother   . Heart disease Brother   . Scoliosis Brother   . Stroke Brother   . Heart attack Brother     Prior to Admission medications   Medication Sig Start Date End Date Taking? Authorizing Provider  ADVAIR DISKUS 250-50 MCG/DOSE AEPB INHALE 1 PUFF BY MOUTH INTO THE LUNGS TWICE DAILY 04/04/15  Yes [name], NP  albuterol (PROVENTIL  HFA;VENTOLIN HFA) 108 (90 BASE) MCG/ACT inhaler Inhale 1 puff into the lungs every 6 (six) hours as needed for wheezing or shortness of breath.   Yes Historical Provider, MD  aspirin 81 MG tablet Take 81 mg by mouth daily.     Yes Historical Provider, MD  cilostazol (PLETAL) 100 MG tablet Take 100 mg by mouth daily.   Yes Historical Provider, MD  cloNIDine (CATAPRES) 0.1 MG tablet Take 0.1 mg by mouth daily.   Yes Historical Provider, MD  cycloSPORINE (RESTASIS) 0.05 % ophthalmic emulsion 1 drop 2 (two) times daily. One drop once a day bilaterally for dry eyes   Yes Historical Provider, MD  diclofenac sodium (VOLTAREN) 1 % GEL Apply 4 g topically 2 (two) times daily as needed. For pain   Yes Historical Provider, MD  DULoxetine (CYMBALTA) 30 MG capsule Take 30 mg by mouth daily.   Yes Historical Provider, MD  enalapril (VASOTEC) 10 MG tablet One daily to control BP and to strengthen the heart Patient taking differently: Take 10 mg by mouth daily.  01/29/15  Yes Estill Dooms, MD  esomeprazole (NEXIUM) 40 MG capsule Take 1 capsule (40 mg total) by mouth daily. 07/16/15  Yes [name], NP  gabapentin (NEURONTIN) 100 MG capsule Take 2 capsules (200 mg total) by mouth 3 (three) times daily. 05/30/15  Yes [name], NP  hydrALAZINE (APRESOLINE) 50 MG tablet Take 50 mg by mouth 2 (two) times daily.   Yes Historical Provider, MD  insulin NPH-regular Human (HUMULIN 70/30) (70-30) 100 UNIT/ML injection Inject 40 units subcutaneously with breakfast and 20 units with dinner to control blood sugar. Patient taking differently: Inject 20-40 Units into the skin 2 (two) times daily with a meal. Inject 40 units subcutaneously with breakfast and 20 units with dinner to control blood sugar. 02/01/15  Yes Estill Dooms, MD  lubiprostone (AMITIZA) 24 MCG capsule Take 1 capsule (24 mcg total) by mouth 2 (two) times daily with a meal. 05/30/15  Yes [name], NP  metoprolol succinate (TOPROL-XL) 25 MG 24  hr tablet Take 25 mg by mouth daily.   Yes Historical Provider, MD  potassium chloride SA (K-DUR,KLOR-CON) 20 MEQ tablet Take 20 mEq by mouth daily.   Yes Historical Provider, MD  sertraline (ZOLOFT) 50 MG tablet Take 50 mg by mouth daily.   Yes Historical Provider, MD  simvastatin (ZOCOR) 20 MG tablet Take 20 mg by mouth daily.   Yes Historical Provider, MD  sucralfate (CARAFATE) 1 G tablet Take 1 g by mouth 2 (two) times daily.   Yes Historical Provider, MD  timolol (TIMOPTIC) 0.5 % ophthalmic solution Place 1 drop into both eyes 2 (two) times daily. One drop in bilaterally twice a day as needed   Yes Historical Provider, MD  torsemide (DEMADEX) 20 MG  tablet Take 1 tablet in morning only to help with leg edema Patient taking differently: Take 20 mg by mouth daily. Take 1 tablet in morning only to help with leg edema 02/21/15  Yes Mahima Pandey, MD  Travoprost, BAK Free, (TRAVATAN) 0.004 % SOLN ophthalmic solution Place 1 drop into both eyes at bedtime. One drop bilaterally twice a day   Yes Historical Provider, MD  cilostazol (PLETAL) 100 MG tablet TAKE 1 TABLET BY MOUTH EVERY DAY Patient not taking: Reported on 08/12/2015 07/24/15   [name], NP  cloNIDine (CATAPRES) 0.1 MG tablet TAKE 1 TABLET BY MOUTH EVERY DAY Patient not taking: Reported on 08/12/2015 02/07/15   Blanchie Serve, MD  DULoxetine (CYMBALTA) 30 MG capsule TAKE 1 CAPSULE BY MOUTH EVERY DAY TO HELP ANXIETY AND PAINS Patient not taking: Reported on 08/12/2015 07/24/15   [name], NP  EASY [facility] INSULIN SYRINGE 31G X 5/16" 0.5 ML MISC USE UP TO 2 TIMES A DAY AS DIRECTED 05/29/15   [name], NP  guaifenesin (ROBITUSSIN) 100 MG/5ML syrup Take 5-10 mLs (100-200 mg total) by mouth every 4 (four) hours as needed for cough. 10/15/14   Milton Ferguson, MD  hydrALAZINE (APRESOLINE) 50 MG tablet TAKE 1 TABLET BY MOUTH TWICE DAILY Patient not taking: Reported on 08/12/2015 06/26/15   [name], NP    HYDROcodone-acetaminophen (NORCO) 10-325 MG per tablet Take 1 tablet by mouth every 8 (eight) hours as needed for moderate pain.  01/03/14   Historical Provider, MD  meloxicam (MOBIC) 15 MG tablet TAKE 1 TABLET BY MOUTH EVERY DAY Patient not taking: Reported on 08/12/2015 07/24/15   [name], NP  metoprolol succinate (TOPROL-XL) 25 MG 24 hr tablet TAKE 1 TABLET BY MOUTH EVERY DAY WITH OR IMMEDIATELY FOLLOWING A MEAL FOR BLOOD PRESSURE Patient not taking: Reported on 08/12/2015 07/24/15   [name], NP  potassium chloride SA (K-DUR,KLOR-CON) 20 MEQ tablet TAKE 1 TABLET BY MOUTH EVERY DAY Patient not taking: Reported on 08/12/2015 06/26/15   [name], NP  sertraline (ZOLOFT) 50 MG tablet TAKE 1 TABLET BY MOUTH EVERY DAY Patient not taking: Reported on 08/12/2015 07/24/15   [name], NP  simvastatin (ZOCOR) 20 MG tablet TAKE 1 TABLET BY MOUTH EVERY DAY Patient not taking: Reported on 08/12/2015 07/24/15   [name], NP  sucralfate (CARAFATE) 1 G tablet Take 1 g by mouth 2 (two) times daily.    Historical Provider, MD  sucralfate (CARAFATE) 1 G tablet TAKE 1 TABLET BY MOUTH TWICE DAILY ON EMPTY STOMACH Patient not taking: Reported on 08/12/2015 07/24/15   [name], NP  VENTOLIN HFA 108 (90 BASE) MCG/ACT inhaler INHALE 1 PUFF 4 TIMES A DAY FOR 5 DAYS, THEN USE EVERY 6 HOURS AS NEEDED Patient not taking: Reported on 08/12/2015 04/17/15   Estill Dooms, MD  VOLTAREN 1 % GEL APPLY 4 GRAM TOPICALLY TWICE DAILY Patient not taking: Reported on 08/12/2015 06/26/15   [name], NP   Physical Exam: Filed Vitals:   08/12/15 0536 08/12/15 0630 08/12/15 0644 08/12/15 0700  BP: 169/53 123/99  167/77  Pulse: 88 69  80  Temp:      TempSrc:      Resp: 22 22  17   SpO2: 99% 90% 99% 100%    GENERAL: NAD, alert, obese female  HEENT: head NCAT, no scleral icterus. Pupils round and reactive. Mucous membranes are moist.  NECK: Supple. No carotid bruits. No  lymphadenopathy or thyromegaly.  LUNGS: B/l expiratory wheezing, decreased breath sounds b/l, normal effort  HEART: Regular rate and rhythm without murmur. 2+ pulses, no JVD, mild peripheral edema  ABDOMEN: Soft, nontender, and nondistended. Positive bowel sounds  EXTREMITIES: Without any cyanosis, clubbing, rash, or lesions. Mild edema.  NEUROLOGIC: Alert and oriented x3. Cranial nerves II through XII are grossly intact. Strength equal in all 4.   PSYCHIATRIC: Normal mood and affect  SKIN: No ulceration or induration present.   Labs on Admission:  Basic Metabolic Panel:  Recent Labs Lab 08/12/15 0454  NA 143  K 4.0  CL 107  CO2 24  GLUCOSE 252*  BUN 30*  CREATININE 2.01*  CALCIUM 9.5   CBC:  Recent Labs Lab 08/12/15 0454  WBC 9.8  NEUTROABS 8.0*  HGB 10.2*  HCT 32.6*  MCV 85.6  PLT PLATELET CLUMPS NOTED ON SMEAR, UNABLE TO ESTIMATE   BNP (last 3 results)  Recent Labs  08/12/15 0454  BNP 149.2*    ProBNP (last 3 results)  Recent Labs  10/15/14 1536  PROBNP 110.2   Radiological Exams on Admission: Dg Chest Port 1 View  08/12/2015   CLINICAL DATA:  Shortness of breath.  EXAM: PORTABLE CHEST - 1 VIEW  COMPARISON:  07/05/2015  FINDINGS: The heart is enlarged. Lower lung volumes from prior exam. Increased interstitial prominence, may be related to lower lung volumes versus pulmonary edema. Suspect minimal fluid in the right minor fissure. No large subpulmonic pleural effusion. No pneumothorax. Advanced degenerative change of both shoulders.  IMPRESSION: Cardiomegaly with increased interstitial prominence, may be related to lower lung volumes versus pulmonary edema. Suspect minimal fluid in the right minor fissure.   Electronically Signed   By: Jeb Levering M.D.   On: 08/12/2015 06:12   EKG: Independently reviewed. Sinus rhythm  Assessment/Plan Active Problems:   Acute on chronic diastolic congestive heart failure   Diabetes mellitus type 2 in obese    Obesity   Essential hypertension, benign   Generalized anxiety disorder   COPD (chronic obstructive pulmonary disease)   Peripheral autonomic neuropathy due to diabetes mellitus   DM type 2, uncontrolled, with renal complications   Acute respiratory failure with hypoxia   COPD exacerbation   Acute COPD Exacerbation - With worsening SOB and hypoxia, requiring 4L O2 via nasal cannula. No hx of chronic O2 requirement - possibly in the setting underlying bronchitis with cough productive of thick sputum x 2 months - steroids, nebs, antibiotics - Close monitoring of respiratory status, continue O2 as needed to keep O2 sats above 88%  Acute on chronic Diastolic HF - With increased LE edema, small pleural effusions noted on CXR, increasing SOB the last few days - IV diuresis - Echo pending, last echo 02/29/2012 showed EF 55-60% - Daily weights, close monitoring of I & O's - Daily BMP to monitor Cr. On admission today, Cr is 2.01. Baseline appears to be around 1.8 over the past year  Acute combined hypoxic and hypercarbic respiratory failure with mild respiratory acidosis - likely in the setting of #1 and #2, closely monitor  Acute on chronic CKD stage III - monitor with diuresis, Cr close to baseline on admission - reassess Cr in am prior to giving additional Lasix  RLE Edema - Appears chronic however she is at high risk of developing DVT due to poor ambulatory status - repeat DVT US, prior studies negative  Diabetes Mellitus Type 2 - Stable, continue NPH plus SSI - Last A1c was  6.8 on 07/02/2015 - Monitor CBG daily  HTN - Slightly elevated, monitor closely - Continue home medications - metoprolol, hydralazine, clonidine - IV hydralazine prn  Hyperlipidemia - Stable, last LDL 62 on 03/14/2015 - Continue home medication - simvastatin  Generalized Anxiety Disorder - Stable, continue home medications  Falls - PT consult   Diet: diabetic Fluids: none  DVT Prophylaxis: SubQ  Heparin  Code Status: DNR per discussion this morning Family Communication: Brother and sister at bedside  Disposition Plan: Continue as inpatient  Merrie Roof, Price Cruzita Lederer, MD [facility] Pager [phone]  If 7PM-7AM, please contact night-coverage [email or web address] Password [facility] 08/12/2015, 7:51 AM

## 2015-08-13 ENCOUNTER — Ambulatory Visit (HOSPITAL_COMMUNITY): Payer: Medicare Other

## 2015-08-13 DIAGNOSIS — I509 Heart failure, unspecified: Secondary | ICD-10-CM

## 2015-08-13 LAB — CBC
HEMATOCRIT: 29.9 % — AB (ref 36.0–46.0)
HEMOGLOBIN: 9.6 g/dL — AB (ref 12.0–15.0)
MCH: 27.1 pg (ref 26.0–34.0)
MCHC: 32.1 g/dL (ref 30.0–36.0)
MCV: 84.5 fL (ref 78.0–100.0)
Platelets: 248 10*3/uL (ref 150–400)
RBC: 3.54 MIL/uL — AB (ref 3.87–5.11)
RDW: 15.9 % — ABNORMAL HIGH (ref 11.5–15.5)
WBC: 9.8 10*3/uL (ref 4.0–10.5)

## 2015-08-13 LAB — HEMOGLOBIN A1C
Hgb A1c MFr Bld: 6.6 % — ABNORMAL HIGH (ref 4.8–5.6)
MEAN PLASMA GLUCOSE: 143 mg/dL

## 2015-08-13 LAB — GLUCOSE, CAPILLARY
GLUCOSE-CAPILLARY: 202 mg/dL — AB (ref 65–99)
Glucose-Capillary: 228 mg/dL — ABNORMAL HIGH (ref 65–99)
Glucose-Capillary: 252 mg/dL — ABNORMAL HIGH (ref 65–99)
Glucose-Capillary: 241 mg/dL — ABNORMAL HIGH (ref 65–99)

## 2015-08-13 LAB — BASIC METABOLIC PANEL
Anion gap: 7 (ref 5–15)
BUN: 41 mg/dL — AB (ref 6–20)
CO2: 27 mmol/L (ref 22–32)
CREATININE: 2.05 mg/dL — AB (ref 0.44–1.00)
Calcium: 9.2 mg/dL (ref 8.9–10.3)
Chloride: 107 mmol/L (ref 101–111)
GFR calc Af Amer: 26 mL/min — ABNORMAL LOW (ref >60)
GFR, EST NON AFRICAN AMERICAN: 22 mL/min — AB (ref >60)
Glucose, Bld: 216 mg/dL — ABNORMAL HIGH (ref 65–99)
Potassium: 4.1 mmol/L (ref 3.5–5.1)
SODIUM: 141 mmol/L (ref 135–145)

## 2015-08-13 MED ORDER — HYDROCOD POLST-CPM POLST ER 10-8 MG/5ML PO SUER
5.0000 mL | Freq: Two times a day (BID) | ORAL | Status: DC | PRN
  Administered 2015-08-14: 5 mL via ORAL
  Filled 2015-08-13: qty 5

## 2015-08-13 MED ORDER — LEVOFLOXACIN IN D5W 250 MG/50ML IV SOLN
250.0000 mg | INTRAVENOUS | Status: DC
  Administered 2015-08-14 – 2015-08-15 (×2): 250 mg via INTRAVENOUS
  Filled 2015-08-13 (×2): qty 50

## 2015-08-13 MED ORDER — INSULIN NPH (HUMAN) (ISOPHANE) 100 UNIT/ML ~~LOC~~ SUSP
28.0000 [IU] | Freq: Every day | SUBCUTANEOUS | Status: DC
  Administered 2015-08-14 – 2015-08-17 (×4): 28 [IU] via SUBCUTANEOUS

## 2015-08-13 NOTE — Evaluation (Signed)
Physical Therapy Evaluation [name] MRN: [id number] DOB: [birth year] Today's Date: 08/13/2015   History of Present Illness  79 y.o. female with a past medical history significant for COPD, CHF, diabetes mellitus, HTN, obesity, and hyperlipidemia and admitted on 9/12 with COPD exacerbation/acute on chronic diastolic heart failure  Clinical Impression  Pt admitted with above diagnosis. Pt currently with functional limitations due to the deficits listed below (see PT Problem List).  Pt will benefit from skilled PT to increase their independence and safety with mobility to allow discharge to the venue listed below.  Pt assisted with ambulating in hallway and to bathroom.  Pt required supplemental oxygen (see below).  Pt states she lives with her brother and has rollator (4 wheels and seat) at home so she can rest as needed.  SATURATION QUALIFICATIONS: (This note is used to comply with regulatory documentation for home oxygen)  Patient Saturations on Room Air at Rest = 94%  Patient Saturations on Room Air while Ambulating = 86%  Patient Saturations on 2 Liters of oxygen while Ambulating = 97%  Please briefly explain why patient needs home oxygen: to maintain oxygen saturations above 88% during physical activity such as ambulation.     Follow Up Recommendations Home health PT    Equipment Recommendations  None recommended by PT    Recommendations for Other Services       Precautions / Restrictions Precautions Precautions: Fall Precaution Comments: monitor sats Restrictions Weight Bearing Restrictions: No      Mobility  Bed Mobility Overal bed mobility: Modified Independent                Transfers Overall transfer level: Needs assistance Equipment used: Rolling walker (2 wheeled) Transfers: Sit to/from Stand Sit to Stand: Min guard         General transfer comment: min/guard and verbal cues for safety, wide  BOS  Ambulation/Gait Ambulation/Gait assistance: Min guard Ambulation Distance (Feet): 60 Feet Assistive device: Rolling walker (2 wheeled) Gait Pattern/deviations: Step-through pattern;Trunk flexed;Decreased stride length     General Gait Details: verbal cues for posture and RW Positioning, tends to keep RW too far forward, SpO2 dropped to 86% room air first couple steps so ambulated on 2L O2 [place] and SpO2 97%  Stairs            Wheelchair Mobility    Modified Rankin (Stroke Patients Only)       Balance                                             Pertinent Vitals/Pain Pain Assessment: No/denies pain    Home Living Family/patient expects to be discharged to:: Private residence Living Arrangements:  (brother)   Type of Home: Apartment ("retirement home")       Home Layout: One level Home Equipment: Environmental consultant - 4 wheels      Prior Function Level of Independence: Independent with assistive device(s)               Hand Dominance        Extremity/Trunk Assessment               Lower Extremity Assessment: Generalized weakness         Communication   Communication: No difficulties  Cognition Arousal/Alertness: Awake/alert Behavior During Therapy: WFL for tasks assessed/performed Overall Cognitive Status: Within Functional Limits for tasks  assessed                      General Comments      Exercises        Assessment/Plan    PT Assessment Patient needs continued PT services  PT Diagnosis Difficulty walking;Generalized weakness   PT Problem List Decreased activity tolerance;Decreased strength;Cardiopulmonary status limiting activity;Decreased mobility;Decreased knowledge of use of DME  PT Treatment Interventions DME instruction;Gait training;Functional mobility training;Patient/family education;Therapeutic activities;Therapeutic exercise   PT Goals (Current goals can be found in the Care Plan section) Acute  Rehab PT Goals PT Goal Formulation: With patient Time For Goal Achievement: 08/20/15 Potential to Achieve Goals: Good    Frequency Min 3X/week   Barriers to discharge        Co-evaluation               End of Session Equipment Utilized During Treatment: Gait belt;Oxygen Activity Tolerance: Patient limited by fatigue Patient left: with call bell/phone within reach;in chair Nurse Communication: Mobility status         Time: [id number] PT Time Calculation (min) (ACUTE ONLY): 27 min   Charges:   PT Evaluation $Initial PT Evaluation Tier I: 1 Procedure     PT G Codes:        [place]wood Quezada,KATHrine E 08/13/2015, 12:05 PM Carmelia Bake, PT, DPT 08/13/2015 Pager: [phone]

## 2015-08-13 NOTE — Clinical Documentation Improvement (Signed)
Internal Medicine  "Acute on chronic CKD stage III" currently documented.  Please specify if this can be further clarified:  Acute kidney injury / acute renal failure on CKD stage III (if present, please also indicate cause of AKI if known -acute tubular necrosis, acute renal cortical necrosis, etc) Other Unable to determine at present  Please exercise your independent, professional judgment when responding. A specific answer is not anticipated or expected.   Thank You,  Gatha Mayer, [place] [phone]

## 2015-08-13 NOTE — Progress Notes (Signed)
Echocardiogram 2D Echocardiogram has been performed.  Michelle Buck 08/13/2015, 1:49 PM

## 2015-08-13 NOTE — Progress Notes (Signed)
PROGRESS NOTE  Michelle Buck [id number] DOB: 12/03/1935 DOA: 08/12/2015 PCP: [name], NP   HPI:  Michelle Buck is a 79 y.o. female with a past medical history significant for COPD, CHF, diabetes mellitus, HTN, obesity, and hyperlipidemia presented to the ED 08/12/2015 with several days of worsening SOB. She was admitted on 9/12 with COPD exacerbation/acute on chronic diastolic heart failure  In the ED, she was found to be hypoxic requiring NRB mask, Cr 2.0, elevated BNP to 149. CXR with potential pulmonary edema. She was given Lasix, steroids and breathing treatment.  Subjective / 24 H Interval events - rested fairly well overnight, had an episode around 2AM of SOB requiring a breathing treatment but was able to sleep comfortably afterward - requiring 4L nasal cannula, O2 sats have been hovering around 96% and patient feels she is breathing adequately - Endorses that she is still wheezing and having some soreness in lower chest, but denies chest tightness or sharp pains, N/V/D - c/o less ability clearing sputum from her productive cough, states the phlegm feels like it is choking her when she gets into coughing spells   Assessment/Plan: Active Problems:   Acute on chronic diastolic congestive heart failure   Diabetes mellitus type 2 in obese   Obesity   Essential hypertension, benign   Generalized anxiety disorder   COPD (chronic obstructive pulmonary disease)   Peripheral autonomic neuropathy due to diabetes mellitus   DM type 2, uncontrolled, with renal complications   Acute respiratory failure with hypoxia   COPD exacerbation   Acute COPD Exacerbation - With worsening SOB and hypoxia, requiring 4L O2 via nasal cannula. No hx of chronic O2 requirement - possibly in the setting of underlying bronchitis with cough productive of thick sputum x 2 months - Continue steroids, nebs, antibiotics, and add tussionex - Close monitoring of respiratory status, continue O2 as  needed to keep O2 sats above 88% - Wean off oxygen as tolerated  Acute on chronic Diastolic HF - With increased LE edema, small pleural effusions noted on CXR 08/12/2015, increasing SOB the last few days - Hold IV diuresis today to prevent worsening of her renal function, consider restarting tomorrow - Echo results pending, last echo 02/29/2012 showed EF 55-60% - Continue daily weights, good output since admission - Daily BMP to monitor Cr, her creatinine has been overall stable in the last day,   Acute combined hypoxic and hypercarbic respiratory failure with mild respiratory acidosis - likely in the setting of #1 and #2, improving  Acute on chronic CKD stage III - monitor with diuresis, Cr close to baseline on admission - Cr fairly stable from yesterday at 2.05, hold lasix today. Will reassess Cr in the AM and decide on further doses of Lasix  RLE Edema - Improving from yesterday - repeat DVT US 08/12/2015 negative for DVT, prior studies all negative  Diabetes Mellitus Type 2 - Persistent increase in CBGs (range 183-236) since admission, increase NPH and continue SSI - Last A1c was 6.8 on 07/02/2015 - Monitor CBG daily, CBGs are fair within the last 24 hours especially given the presence of steroid, up titrate NPH today  HTN - Variable but currently WNL, monitor closely - Continue home medications - metoprolol, hydralazine, clonidine - IV hydralazine prn  Hyperlipidemia - Stable, last LDL 62 on 03/14/2015 - Continue home medication - simvastatin  Generalized Anxiety Disorder - Stable, continue home medications  Falls - PT consult   Diet: Diet Carb Modified Fluid consistency:: Thin; Room  service appropriate?: Yes with Assist Fluids: None DVT Prophylaxis: Sub Q Heparin  Code Status: DNR Family Communication: None today  Disposition Plan: Home when ready, 1-2 days   Consultants:  PT  Procedures:  Echo 08/13/2015   Antibiotics  Anti-infectives    Start      Dose/Rate Route Frequency Ordered Stop   08/12/15 0900  levofloxacin (LEVAQUIN) IVPB 500 mg     500 mg 100 mL/hr over 60 Minutes Intravenous Every 24 hours 08/12/15 0806       Studies  Dg Chest Port 1 View  08/12/2015   CLINICAL DATA:  Shortness of breath.  EXAM: PORTABLE CHEST - 1 VIEW  COMPARISON:  07/05/2015  FINDINGS: The heart is enlarged. Lower lung volumes from prior exam. Increased interstitial prominence, may be related to lower lung volumes versus pulmonary edema. Suspect minimal fluid in the right minor fissure. No large subpulmonic pleural effusion. No pneumothorax. Advanced degenerative change of both shoulders.  IMPRESSION: Cardiomegaly with increased interstitial prominence, may be related to lower lung volumes versus pulmonary edema. Suspect minimal fluid in the right minor fissure.   Electronically Signed   By: Jeb Levering M.D.   On: 08/12/2015 06:12    Objective  Filed Vitals:   08/12/15 2055 08/13/15 0127 08/13/15 0512 08/13/15 0822  BP: 149/67  118/56   Pulse: 65  71 73  Temp: 97.7 F (36.5 C)  97.6 F (36.4 C)   TempSrc: Oral  Oral   Resp: 20  20 20   Height:      Weight:   108.001 kg (238 lb 1.6 oz)   SpO2: 98% 96% 96% 93%    Intake/Output Summary (Last 24 hours) at 08/13/15 0837 Last data filed at 08/13/15 0651  Gross per 24 hour  Intake    580 ml  Output    450 ml  Net    130 ml   Filed Weights   08/12/15 1734 08/13/15 0512  Weight: 108.047 kg (238 lb 3.2 oz) 108.001 kg (238 lb 1.6 oz)    Exam:  GENERAL: NAD, sitting on side of bed comfortably during interview  HEENT: head NCAT, no scleral icterus. Pupils round and reactive. Mucous membranes are moist. Posterior pharynx clear of any exudate or lesions.  NECK: Supple. No carotid bruits. No lymphadenopathy or thyromegaly.  LUNGS: B/l expiratory wheezes, normal effort  HEART: Regular rate and rhythm without murmur. 2+ pulses, no JVD, minimal peripheral edema  ABDOMEN: Soft, nontender, and  nondistended. Positive bowel sounds  EXTREMITIES: Without any cyanosis, clubbing, rash, or lesions. No edema.  NEUROLOGIC: No focal deficits. Strength equal and appropriate in all 4.  PSYCHIATRIC: Normal mood and affect.  SKIN: No ulceration or induration present.  Data Reviewed: Basic Metabolic Panel:  Recent Labs Lab 08/12/15 0454 08/13/15 0544  NA 143 141  K 4.0 4.1  CL 107 107  CO2 24 27  GLUCOSE 252* 216*  BUN 30* 41*  CREATININE 2.01* 2.05*  CALCIUM 9.5 9.2   CBC:  Recent Labs Lab 08/12/15 0454 08/13/15 0544  WBC 9.8 9.8  NEUTROABS 8.0*  --   HGB 10.2* 9.6*  HCT 32.6* 29.9*  MCV 85.6 84.5  PLT PLATELET CLUMPS NOTED ON SMEAR, UNABLE TO ESTIMATE 248   BNP (last 3 results)  Recent Labs  08/12/15 0454  BNP 149.2*    ProBNP (last 3 results)  Recent Labs  10/15/14 1536  PROBNP 110.2    CBG:  Recent Labs Lab 08/12/15 1206 08/12/15 1658 08/12/15 2056 08/13/15  [place]* 183* 202*    Recent Results (from the past 240 hour(s))  MRSA PCR Screening     Status: None   Collection Time: 08/12/15  1:00 PM  Result Value Ref Range Status   MRSA by PCR NEGATIVE NEGATIVE Final    Comment:        The GeneXpert MRSA Assay (FDA approved for NASAL specimens only), is one component of a comprehensive MRSA colonization surveillance program. It is not intended to diagnose MRSA infection nor to guide or monitor treatment for MRSA infections.      Scheduled Meds: . antiseptic oral rinse  7 mL Mouth Rinse BID  . aspirin  81 mg Oral Daily  . budesonide (PULMICORT) nebulizer solution  0.25 mg Nebulization BID  . cilostazol  100 mg Oral Daily  . cloNIDine  0.1 mg Oral Daily  . cycloSPORINE  1 drop Both Eyes BID  . DULoxetine  30 mg Oral Daily  . gabapentin  200 mg Oral TID  . heparin  5,000 Units Subcutaneous Q8H  . hydrALAZINE  50 mg Oral BID  . insulin aspart  0-5 Units Subcutaneous QHS  . insulin aspart  0-9 Units Subcutaneous TID WC   . insulin NPH Human  15 Units Subcutaneous QHS  . insulin NPH Human  25 Units Subcutaneous QAC breakfast  . ipratropium-albuterol  3 mL Nebulization Q6H  . latanoprost  1 drop Both Eyes QHS  . levofloxacin (LEVAQUIN) IV  500 mg Intravenous Q24H  . lubiprostone  24 mcg Oral BID WC  . methylPREDNISolone (SOLU-MEDROL) injection  60 mg Intravenous 3 times per day  . metoprolol succinate  25 mg Oral Daily  . sertraline  50 mg Oral Daily  . simvastatin  20 mg Oral Daily  . sodium chloride  3 mL Intravenous Q12H  . sucralfate  1 g Oral BID  . timolol  1 drop Both Eyes BID   Continuous Infusions:   Merrie Roof, Student-PA  Marzetta Board, MD [facility] Pager [phone]. If 7 PM - 7 AM, please contact night-coverage at [email or web address], password [facility] 08/13/2015, 8:37 AM  LOS: 1 day

## 2015-08-14 DIAGNOSIS — E1122 Type 2 diabetes mellitus with diabetic chronic kidney disease: Secondary | ICD-10-CM

## 2015-08-14 DIAGNOSIS — E669 Obesity, unspecified: Secondary | ICD-10-CM

## 2015-08-14 DIAGNOSIS — N179 Acute kidney failure, unspecified: Secondary | ICD-10-CM

## 2015-08-14 DIAGNOSIS — J441 Chronic obstructive pulmonary disease with (acute) exacerbation: Secondary | ICD-10-CM

## 2015-08-14 DIAGNOSIS — I5033 Acute on chronic diastolic (congestive) heart failure: Principal | ICD-10-CM

## 2015-08-14 DIAGNOSIS — N183 Chronic kidney disease, stage 3 (moderate): Secondary | ICD-10-CM

## 2015-08-14 DIAGNOSIS — J9601 Acute respiratory failure with hypoxia: Secondary | ICD-10-CM

## 2015-08-14 DIAGNOSIS — N189 Chronic kidney disease, unspecified: Secondary | ICD-10-CM

## 2015-08-14 DIAGNOSIS — E0843 Diabetes mellitus due to underlying condition with diabetic autonomic (poly)neuropathy: Secondary | ICD-10-CM

## 2015-08-14 DIAGNOSIS — E119 Type 2 diabetes mellitus without complications: Secondary | ICD-10-CM

## 2015-08-14 LAB — BASIC METABOLIC PANEL
ANION GAP: 8 (ref 5–15)
BUN: 51 mg/dL — AB (ref 6–20)
CHLORIDE: 105 mmol/L (ref 101–111)
CO2: 27 mmol/L (ref 22–32)
Calcium: 9.2 mg/dL (ref 8.9–10.3)
Creatinine, Ser: 2.25 mg/dL — ABNORMAL HIGH (ref 0.44–1.00)
GFR calc Af Amer: 23 mL/min — ABNORMAL LOW (ref >60)
GFR, EST NON AFRICAN AMERICAN: 20 mL/min — AB (ref >60)
GLUCOSE: 247 mg/dL — AB (ref 65–99)
POTASSIUM: 4.2 mmol/L (ref 3.5–5.1)
Sodium: 140 mmol/L (ref 135–145)

## 2015-08-14 LAB — CBC
HEMATOCRIT: 31.3 % — AB (ref 36.0–46.0)
HEMOGLOBIN: 10 g/dL — AB (ref 12.0–15.0)
MCH: 27.2 pg (ref 26.0–34.0)
MCHC: 31.9 g/dL (ref 30.0–36.0)
MCV: 85.1 fL (ref 78.0–100.0)
PLATELETS: 261 10*3/uL (ref 150–400)
RBC: 3.68 MIL/uL — AB (ref 3.87–5.11)
RDW: 15.8 % — ABNORMAL HIGH (ref 11.5–15.5)
WBC: 11 10*3/uL — AB (ref 4.0–10.5)

## 2015-08-14 LAB — GLUCOSE, CAPILLARY
GLUCOSE-CAPILLARY: 217 mg/dL — AB (ref 65–99)
GLUCOSE-CAPILLARY: 209 mg/dL — AB (ref 65–99)
Glucose-Capillary: 214 mg/dL — ABNORMAL HIGH (ref 65–99)
Glucose-Capillary: 175 mg/dL — ABNORMAL HIGH (ref 65–99)

## 2015-08-14 MED ORDER — IPRATROPIUM-ALBUTEROL 0.5-2.5 (3) MG/3ML IN SOLN: 3.0000 mL | RESPIRATORY_TRACT | Status: DC | PRN

## 2015-08-14 MED ORDER — FUROSEMIDE 10 MG/ML IJ SOLN
20.0000 mg | Freq: Two times a day (BID) | INTRAMUSCULAR | Status: DC
  Administered 2015-08-14 – 2015-08-17 (×6): 20 mg via INTRAVENOUS
  Filled 2015-08-14 (×6): qty 2

## 2015-08-14 MED ORDER — DOCUSATE SODIUM 100 MG PO CAPS
200.0000 mg | ORAL_CAPSULE | Freq: Every day | ORAL | Status: DC | PRN
  Administered 2015-08-14 – 2015-08-16 (×2): 200 mg via ORAL
  Filled 2015-08-14 (×2): qty 2

## 2015-08-14 NOTE — Progress Notes (Signed)
Physical Therapy Treatment [name] MRN: [id number] DOB: 1936-02-03 Today's Date: 08/14/2015    History of Present Illness 79 y.o. female with a past medical history significant for COPD, CHF, diabetes mellitus, HTN, obesity, and hyperlipidemia and admitted on 9/12 with COPD exacerbation/acute on chronic diastolic heart failure    PT Comments    Progressing with mobility.   Follow Up Recommendations  Home health PT     Equipment Recommendations  None recommended by PT    Recommendations for Other Services       Precautions / Restrictions Precautions Precautions: Fall Precaution Comments: monitor sats Restrictions Weight Bearing Restrictions: No    Mobility  Bed Mobility Overal bed mobility: Modified Independent             General bed mobility comments: HOB elevated  Transfers Overall transfer level: Needs assistance Equipment used: Rolling walker (2 wheeled) Transfers: Sit to/from Stand Sit to Stand: Min guard         General transfer comment: close guard for safety. Increased time  Ambulation/Gait Ambulation/Gait assistance: Min guard Ambulation Distance (Feet): 60 Feet (x2) Assistive device: Rolling walker (2 wheeled) Gait Pattern/deviations: Decreased stride length;Step-through pattern;Trunk flexed     General Gait Details: verbal cues for posture and RW Positioning, tends to keep RW too far forward. close guard for safety. Seated rest break after first 60 feet.   Stairs            Wheelchair Mobility    Modified Rankin (Stroke Patients Only)       Balance Overall balance assessment: Needs assistance         Standing balance support: Bilateral upper extremity supported;During functional activity Standing balance-Leahy Scale: Poor Standing balance comment: needs RW                    Cognition Arousal/Alertness: Awake/alert Behavior During Therapy: WFL for tasks assessed/performed Overall  Cognitive Status: Within Functional Limits for tasks assessed                      Exercises      General Comments        Pertinent Vitals/Pain Pain Assessment: No/denies pain    Home Living                      Prior Function            PT Goals (current goals can now be found in the care plan section) Progress towards PT goals: Progressing toward goals    Frequency  Min 3X/week    PT Plan Current plan remains appropriate    Co-evaluation             End of Session Equipment Utilized During Treatment: Oxygen Activity Tolerance: Patient limited by fatigue Patient left: in bed;with call bell/phone within reach;with bed alarm set     Time: [id number] PT Time Calculation (min) (ACUTE ONLY): 23 min  Charges:  $Gait Training: 23-37 mins                    G Codes:      Michelle Buck, MPT Pager: [phone]

## 2015-08-14 NOTE — Care Management Important Message (Signed)
Important Message  Patient Details  Name: Michelle Buck MRN: [id number] Date of Birth: [birth year]   Medicare Important Message Given:  Yes-second notification given    Camillo Flaming 08/14/2015, 11:35 AMImportant Message  Patient Details  Name: Michelle Buck MRN: [id number] Date of Birth: January 14, 1936   Medicare Important Message Given:  Yes-second notification given    Camillo Flaming 08/14/2015, 11:34 AM

## 2015-08-14 NOTE — Care Management Note (Signed)
Case Management Note  Patient Details  Name: Michelle Buck MRN: [id number] Date of Birth: 08-Dec-1935  Subjective/Objective: 79 y/o f admitted w/Acute respiratory failure. From home w/brother.Has private duty caregiver services.She recommended I talk to her sister Stoney Bang about d/c plans-Spoke to Barbara-informed her of PT recc HHPT, left [facility] agency list in rm for choice.Patient on 02 currently. If home 02 needed can arrange.                    Action/Plan:d/c plan home w/HHC.   Expected Discharge Date:   (unknown)               Expected Discharge Plan:  [place]  In-House Referral:  Clinical Social Work  Discharge planning Services  CM Consult  Post Acute Care Choice:  Resumption of Svcs/PTA Provider ([place] private duty care giver services.) Choice offered to:  Patient  DME Arranged:    DME Agency:     HH Arranged:    [place] Agency:     Status of Service:  In process, will continue to follow  Medicare Important Message Given:    Date Medicare IM Given:    Medicare IM give by:    Date Additional Medicare IM Given:    Additional Medicare Important Message give by:     If discussed at [place] of Stay Meetings, dates discussed:    Additional Comments:  Dessa Phi, RN 08/14/2015, 9:47 AM

## 2015-08-14 NOTE — Progress Notes (Signed)
Inpatient Diabetes Program Recommendations  AACE/ADA: New Consensus Statement on Inpatient Glycemic Control (2015)  Target Ranges:  Prepandial:   less than 140 mg/dL      Peak postprandial:   less than 180 mg/dL (1-2 hours)      Critically ill patients:  140 - 180 mg/dL   Review of Glycemic Control  Results for Michelle Buck, Michelle Buck (MRN [id number]) as of 08/14/2015 11:13  Ref. Range 08/13/2015 07:29 08/13/2015 11:47 08/13/2015 16:28 08/13/2015 21:50 08/14/2015 07:13  Glucose-Capillary Latest Ref Range: 65-99 mg/dL 202 (H) 228 (H) 252 (H) 241 (H) 217 (H)  Results for SIERAH, WEGE (MRN [id number]) as of 08/14/2015 11:13  Ref. Range 08/12/2015 10:07  Hemoglobin A1C Latest Ref Range: 4.8-5.6 % 6.6 (H)    Inpatient Diabetes Program Recommendations: Insulin - Basal: Change NPH to Novolog 70/30   Will continue to follow. Thank you. Lorenda Peck, RD, LDN, CDE Inpatient Diabetes Coordinator [phone]

## 2015-08-14 NOTE — Progress Notes (Addendum)
Patient ID: Michelle Buck, female   DOB: 11-26-1936, 79 y.o.   MRN: [id number]  [facility] PROGRESS NOTE  Michelle Buck [id number] DOB: 09/11/1936 DOA: 08/12/2015 PCP: Michelle Chandler, NP   Brief narrative:    79 y.o. female with known COPD, CHF, DM, HTN, morbid obesity, presented to [facility] ED with main concern of several days duration off progressively worsening dyspnea that initially started with exertion and has progressed to dyspnea at rest. She was admitted for evaluation of COPD exacerbation as well as acute on chronic diastolic CHF.  In the ED, she was found to be hypoxic requiring NRB mask, Cr 2.0, elevated BNP to 149. CXR with potential pulmonary edema. She was given Lasix, steroids and breathing treatment.  Assessment/Plan:    Active Problems:   Acute hypoxic respiratory failure with hypercarbia, secondary to acute on chronic COPD exacerbation, acute on chronic diastolic CHF grade 2 - Patient is clinically improving but still with scattered rhonchi's and crackles at base is noted on exam - Patient is still on oxygen via nasal cannula 3-4 L, will continue to reassess oxygen needs - Please see detailed management planned for COPD and CHF below    Acute on chronic diastolic congestive heart failure - Per 2-D echo in 2013, patient had grade 2 diastolic CHF - Repeat 2-D echo on this admission 08/13/2015 with EF 55-60%, could not clearly evaluate diastolic dysfunction due to body habitus - Patient weight is still 237 lbs, was 238 lbs on admission - Place on Lasix 20 mg IV twice a day - Continue to monitor daily weights, strict I/O    Acute on chronic COPD exacerbation with acute bronchitis  - Still with mild expiratory wheezing on exam - Currently on Solu-Medrol 60 mg 3 times a day, continue same regimen - Continue broncho-dilators scheduled and as needed - continue Levaquin day #3/7 - Continue Pulmicort - Continue to reassess oxygen needs - at this point, pt meets  criteria for home oxygen as her sat's drop to 80's with ambulation     Diabetes mellitus type 2 in morbidly obese pt, with complications of CKD stage III, PVD, neuropathies - Currently on insulin NPH, 15 units in a.m. and 28 units in p.m. - Continue current regimen along with sliding scale insulin for better cover - continue aspirin for PVD  - Continue Neurontin    Acute on chronic CKD stage III - With baseline creatinine around 1.7 - 1.9 based on recent blood work - Creatinine is up this time and possibly related to acute on chronic diastolic CHF, ? enalapril side effect, ? ATN - Continue to monitor closely - Avoid nephrotoxic medications especially enalapril - We will allow Lasix for treatment of CHF    Anemia of chronic disease, CKD - no signs of active bleeding, monitor     Leukocytosis - steroid induced - continue same dose of solumedrol for now and plan on tapering down in next 24 hours  - CBC in AM    Morbidly obese - Body mass index is 40.75 kg/(m^2)    Accelerated hypertension - Currently on clonidine, hydralazine, Lasix, Metoprolol  - Continue same regimen for now    RLE Edema - Improving from yesterday  - repeat DVT US 08/12/2015 negative for DVT, prior studies all negative    Hyperlipidemia - Stable, last LDL 62 on 03/14/2015 - I have discussed with pt significance of LDL and target range  - Continue home medication simvastatin  DVT prophylaxis - Heparin SQ  Code Status: DNR Family Communication:  plan of care discussed with the patient Disposition Plan: Home when stable. HH PT order placed, reviewed PT notes and case manager notes.   IV access:  Peripheral IV  Procedures and diagnostic studies:    Dg Chest Port 1 View 2015/08/15  Cardiomegaly with increased interstitial prominence, may be related to lower lung volumes versus pulmonary edema. Suspect minimal fluid in the right minor fissure.     Medical Consultants:  None  Other Consultants:   PT  IAnti-Infectives:   Levaquin 08-15-23 -->  Faye Ramsay, MD  [facility] Pager [phone]  If 7PM-7AM, please contact night-coverage [email or web address] Password [email or web address] 08/14/2015, 12:21 PM   LOS: 2 days   HPI/Subjective: No events overnight. Pt reports exertional dyspnea and wheezing.   Objective: Filed Vitals:   08/14/15 0500 08/14/15 0711 08/14/15 0826 08/14/15 0835  BP: 147/82  149/79 149/79  Pulse: 59 77  67  Temp: 98.2 F (36.8 C)     TempSrc: Oral     Resp: 20 19    Height:      Weight: 107.729 kg (237 lb 8 oz)     SpO2: 100% 98%      Intake/Output Summary (Last 24 hours) at 08/14/15 1221 Last data filed at 08/14/15 0900  Gross per 24 hour  Intake    960 ml  Output    725 ml  Net    235 ml    Exam:   General:  Pt is alert, follows commands appropriately, not in acute distress, morbidly obese   Cardiovascular: Regular rate and rhythm, S1/S2, no murmurs, no rubs, no gallops  Respiratory: Clear to auscultation bilaterally, scattered rhonchi and expiratory wheezing   Abdomen: Soft, non tender, non distended, bowel sounds present, no guarding  Extremities: Trace bilateral LE pitting edema, pulses DP and PT palpable bilaterally  Neuro: Grossly nonfocal  Data Reviewed: Basic Metabolic Panel:  Recent Labs Lab 08/15/2015 0454 08/13/15 0544 08/14/15 0430  NA 143 141 140  K 4.0 4.1 4.2  CL 107 107 105  CO2 24 27 27   GLUCOSE 252* 216* 247*  BUN 30* 41* 51*  CREATININE 2.01* 2.05* 2.25*  CALCIUM 9.5 9.2 9.2   CBC:  Recent Labs Lab 2015-08-15 0454 08/13/15 0544 08/14/15 0430  WBC 9.8 9.8 11.0*  NEUTROABS 8.0*  --   --   HGB 10.2* 9.6* 10.0*  HCT 32.6* 29.9* 31.3*  MCV 85.6 84.5 85.1  PLT PLATELET CLUMPS NOTED ON SMEAR, UNABLE TO ESTIMATE 248 261   CBG:  Recent Labs Lab 08/13/15 1147 08/13/15 1628 08/13/15 2150 08/14/15 0713 08/14/15 1204  GLUCAP 228* 252* 241* 217* 214*    Recent Results (from the past 240 hour(s))  MRSA PCR Screening      Status: None   Collection Time: 08/15/15  1:00 PM  Result Value Ref Range Status   MRSA by PCR NEGATIVE NEGATIVE Final    Comment:        The GeneXpert MRSA Assay (FDA approved for NASAL specimens only), is one component of a comprehensive MRSA colonization surveillance program. It is not intended to diagnose MRSA infection nor to guide or monitor treatment for MRSA infections.      Scheduled Meds: . antiseptic oral rinse  7 mL Mouth Rinse BID  . aspirin  81 mg Oral Daily  . budesonide (PULMICORT) nebulizer solution  0.25 mg Nebulization BID  . cilostazol  100 mg Oral Daily  . cloNIDine  0.1 mg Oral Daily  .  cycloSPORINE  1 drop Both Eyes BID  . DULoxetine  30 mg Oral Daily  . gabapentin  200 mg Oral TID  . heparin  5,000 Units Subcutaneous Q8H  . hydrALAZINE  50 mg Oral BID  . insulin aspart  0-5 Units Subcutaneous QHS  . insulin aspart  0-9 Units Subcutaneous TID WC  . insulin NPH Human  15 Units Subcutaneous QHS  . insulin NPH Human  28 Units Subcutaneous QAC breakfast  . ipratropium-albuterol  3 mL Nebulization Q6H  . latanoprost  1 drop Both Eyes QHS  . levofloxacin (LEVAQUIN) IV  250 mg Intravenous Q24H  . lubiprostone  24 mcg Oral BID WC  . methylPREDNISolone (SOLU-MEDROL) injection  60 mg Intravenous 3 times per day  . metoprolol succinate  25 mg Oral Daily  . sertraline  50 mg Oral Daily  . simvastatin  20 mg Oral Daily  . sodium chloride  3 mL Intravenous Q12H  . sucralfate  1 g Oral BID  . timolol  1 drop Both Eyes BID   Continuous Infusions:        (

## 2015-08-14 NOTE — Progress Notes (Signed)
Report received from [place], RN. I agree with previous RN's assessment. Will continue to monitor pt closely. Carnella [place] I

## 2015-08-15 LAB — BASIC METABOLIC PANEL
Anion gap: 9 (ref 5–15)
BUN: 60 mg/dL — AB (ref 6–20)
CALCIUM: 9.6 mg/dL (ref 8.9–10.3)
CHLORIDE: 106 mmol/L (ref 101–111)
CO2: 26 mmol/L (ref 22–32)
CREATININE: 2.3 mg/dL — AB (ref 0.44–1.00)
GFR calc Af Amer: 22 mL/min — ABNORMAL LOW (ref >60)
GFR calc non Af Amer: 19 mL/min — ABNORMAL LOW (ref >60)
GLUCOSE: 248 mg/dL — AB (ref 65–99)
Potassium: 4 mmol/L (ref 3.5–5.1)
Sodium: 141 mmol/L (ref 135–145)

## 2015-08-15 LAB — CBC
HEMATOCRIT: 33.9 % — AB (ref 36.0–46.0)
HEMOGLOBIN: 10.6 g/dL — AB (ref 12.0–15.0)
MCH: 26.5 pg (ref 26.0–34.0)
MCHC: 31.3 g/dL (ref 30.0–36.0)
MCV: 84.8 fL (ref 78.0–100.0)
Platelets: 262 10*3/uL (ref 150–400)
RBC: 4 MIL/uL (ref 3.87–5.11)
RDW: 15.7 % — AB (ref 11.5–15.5)
WBC: 10.5 10*3/uL (ref 4.0–10.5)

## 2015-08-15 LAB — GLUCOSE, CAPILLARY
Glucose-Capillary: 207 mg/dL — ABNORMAL HIGH (ref 65–99)
Glucose-Capillary: 270 mg/dL — ABNORMAL HIGH (ref 65–99)
Glucose-Capillary: 175 mg/dL — ABNORMAL HIGH (ref 65–99)
Glucose-Capillary: 192 mg/dL — ABNORMAL HIGH (ref 65–99)

## 2015-08-15 MED ORDER — LEVOFLOXACIN 250 MG PO TABS
250.0000 mg | ORAL_TABLET | Freq: Every day | ORAL | Status: DC
  Administered 2015-08-16 – 2015-08-17 (×2): 250 mg via ORAL
  Filled 2015-08-15 (×2): qty 1

## 2015-08-15 NOTE — Progress Notes (Signed)
Patient had a run of 3 PVCs followed by a 4.99 second run of ST, HR 140. Patient returned to baseline. Patient sleeping at the time of the occurrence.

## 2015-08-15 NOTE — Care Management Note (Signed)
Case Management Note  Patient Details  Name: Michelle Buck MRN: [id number] Date of Birth: May 10, 1936  Subjective/Objective:  [facility] chosen for HHRN/HHPT/HHNurse's aide.Await HHC order, face to face.[facility] rep Krissten aware.[facility] dme rep Lecretia aware of home 02.                  Action/Plan:d/c plan home w/HHC/DME.   Expected Discharge Date:   (unknown)               Expected Discharge Plan:  [place]  In-House Referral:  Clinical Social Work  Discharge planning Services  CM Consult  Post Acute Care Choice:  Resumption of Svcs/PTA Provider ([place] private duty care giver services.) Choice offered to:  Patient  DME Arranged:  [place] DME Agency:  [place]:    [place] Agency:  Will  Status of Service:  In process, will continue to follow  Medicare Important Message Given:  Yes-second notification given Date Medicare IM Given:    Medicare IM give by:    Date Additional Medicare IM Given:    Additional Medicare Important Message give by:     If discussed at [place] of Stay Meetings, dates discussed:    Additional Comments:  Dessa Phi, RN 08/15/2015, 1:33 PM

## 2015-08-15 NOTE — Care Management Note (Signed)
Case Management Note  Patient Details  Name: Lexis Machia MRN: [id number] Date of Birth: 04/03/1936  Subjective/Objective:  Received [facility] orders.[facility] rep Kristen aware & following for d/c.[facility] dme rep Lecretia aware of home 02 order.Will deliver to rm @ d/c if still qualifies.                  Action/Plan:d/c home w/HHC/DME.   Expected Discharge Date:   (unknown)               Expected Discharge Plan:  [place]  In-House Referral:  Clinical Social Work  Discharge planning Services  CM Consult  Post Acute Care Choice:  Resumption of Svcs/PTA Provider ([place] private duty care giver services.) Choice offered to:  Patient  DME Arranged:  [facility] DME Agency:  [place]:  RN, PT, OT, Nurse's Aide [place] Agency:  [place]  Status of Service:  In process, will continue to follow  Medicare Important Message Given:  Yes-second notification given Date Medicare IM Given:    Medicare IM give by:    Date Additional Medicare IM Given:    Additional Medicare Important Message give by:     If discussed at [place] of Stay Meetings, dates discussed:    Additional Comments:  Dessa Phi, RN 08/15/2015, 1:36 PM

## 2015-08-15 NOTE — Progress Notes (Signed)
Pharmacy IV to PO conversion  This patient is receiving LEVOFLOXACIN by the intravenous route. Based on criteria approved by the Pharmacy and Therapeutics Committee, and the Infectious Disease Division, the antibiotic(s) is/are being converted to equivalent oral dose form(s). These criteria include:   Patient being treated for a respiratory tract infection, urinary tract infection, cellulitis, or Clostridium Difficile Associated Diarrhea  The patient is not neutropenic and does not exhibit a GI malabsorption state  The patient is eating (either orally or per tube) and/or has been taking other orally administered medications for at least 24 hours.  The patient is improving clinically (physician assessment and a 24-hour Tmax of <=100.5 F)  If you have any questions about this conversion, please contact the Pharmacy Department (ext [phone]).  Thank you.  Reuel Boom, PharmD Pager: [phone] 08/15/2015, 3:58 PM

## 2015-08-15 NOTE — Progress Notes (Signed)
Patient ID: Michelle Buck, female   DOB: [birth year], 79 y.o.   MRN: [id number]  [facility] PROGRESS NOTE  Michelle Buck [id number] DOB: [birth year] DOA: 08/12/2015 PCP: [name], NP   Brief narrative:    79 y.o. female with known COPD, CHF, DM, HTN, morbid obesity, presented to [facility] ED with main concern of several days duration off progressively worsening dyspnea that initially started with exertion and has progressed to dyspnea at rest. She was admitted for evaluation of COPD exacerbation as well as acute on chronic diastolic CHF.  In the ED, she was found to be hypoxic requiring NRB mask, Cr 2.0, elevated BNP to 149. CXR with potential pulmonary edema. She was given Lasix, steroids and breathing treatment.  Assessment/Plan:    Active Problems:   Acute hypoxic respiratory failure with hypercarbia, secondary to acute on chronic COPD exacerbation, acute on chronic diastolic CHF grade 2 - Patient is clinically improving with minimal crackles at bases and no wheezing  - Patient is still on oxygen via nasal cannula 3-4 L, will continue to reassess oxygen needs - Please see detailed management planned for COPD and CHF below    Acute on chronic diastolic congestive heart failure - Per 2-D echo in 2013, patient had grade 2 diastolic CHF - Repeat 2-D echo on this admission 08/13/2015 with EF 55-60%, could not clearly evaluate diastolic dysfunction due to body habitus - Patient weight is still 237 lbs, was 238 lbs on admission - Continue Lasix 20 mg IV twice a day - Continue to monitor daily weights, strict I/O    Acute on chronic COPD exacerbation with acute bronchitis  - Still with mild expiratory wheezing on exam - Currently on Solu-Medrol 60 mg 3 times a day, continue same regimen - Continue broncho-dilators scheduled and as needed - continue Levaquin day #4/7 - Continue Pulmicort - Continue to reassess oxygen needs - check oxygen with ambulation to reassess  discharge oxygen need     Diabetes mellitus type 2 in morbidly obese pt, with complications of CKD stage III, PVD, neuropathies - Currently on insulin NPH, 15 units in a.m. and 28 units in p.m. - Continue current regimen along with sliding scale insulin for better cover - continue aspirin for PVD  - Continue Neurontin    Acute on chronic CKD stage III - With baseline creatinine around 1.7 - 1.9 based on recent blood work - Creatinine is up this time and possibly related to acute on chronic diastolic CHF, ? enalapril side effect, ? ATN - Continue to monitor closely - Avoid nephrotoxic medications especially enalapril - We will allow Lasix for treatment of CHF    Anemia of chronic disease, CKD - no signs of active bleeding, monitor     Leukocytosis - steroid induced - taper down solumedrol     Morbidly obese - Body mass index is 40.75 kg/(m^2)    Accelerated hypertension - Currently on clonidine, hydralazine, Lasix, Metoprolol  - Continue same regimen for now    RLE Edema - Improving from yesterday  - repeat DVT US 08/12/2015 negative for DVT, prior studies all negative    Hyperlipidemia - Stable, last LDL 62 on 03/14/2015 - I have discussed with pt significance of LDL and target range  - Continue home medication simvastatin  DVT prophylaxis - Heparin SQ  Code Status: DNR Family Communication:  plan of care discussed with the patient Disposition Plan: Home when stable. HH PT order placed, reviewed PT notes and case manager notes.  IV access:  Peripheral IV  Procedures and diagnostic studies:    Dg Chest Port 1 View 2015-08-31  Cardiomegaly with increased interstitial prominence, may be related to lower lung volumes versus pulmonary edema. Suspect minimal fluid in the right minor fissure.     Medical Consultants:  None  Other Consultants:  PT  IAnti-Infectives:   Levaquin 2023-08-31 -->  Faye Ramsay, MD  [facility] Pager [phone]  If 7PM-7AM, please contact  night-coverage [email or web address] Password [email or web address] 08/15/2015, 7:00 AM   LOS: 3 days   HPI/Subjective: No events overnight. Pt reports exertional dyspnea and wheezing.   Objective: Filed Vitals:   08/14/15 2036 08/14/15 2054 08/15/15 0159 08/15/15 0625  BP:  137/76  145/71  Pulse: 76 56  57  Temp:  97.8 F (36.6 C)  97.8 F (36.6 C)  TempSrc:  Oral  Oral  Resp: 18 18  18   Height:      Weight:      SpO2:  100% 97% 99%    Intake/Output Summary (Last 24 hours) at 08/15/15 0700 Last data filed at 08/14/15 2010  Gross per 24 hour  Intake    650 ml  Output    300 ml  Net    350 ml    Exam:   General:  Pt is alert, follows commands appropriately, not in acute distress, morbidly obese   Cardiovascular: Regular rate and rhythm, S1/S2, no murmurs, no rubs, no gallops  Respiratory: Clear to auscultation bilaterally, no wheezing, minimal bibasilar crackles   Abdomen: Soft, non tender, non distended, bowel sounds present, no guarding  Extremities: Trace bilateral LE pitting edema, pulses DP and PT palpable bilaterally  Neuro: Grossly nonfocal  Data Reviewed: Basic Metabolic Panel:  Recent Labs Lab 2015-08-31 0454 08/13/15 0544 08/14/15 0430 08/15/15 0419  NA 143 141 140 141  K 4.0 4.1 4.2 4.0  CL 107 107 105 106  CO2 24 27 27 26   GLUCOSE 252* 216* 247* 248*  BUN 30* 41* 51* 60*  CREATININE 2.01* 2.05* 2.25* 2.30*  CALCIUM 9.5 9.2 9.2 9.6   CBC:  Recent Labs Lab 08-31-2015 0454 08/13/15 0544 08/14/15 0430 08/15/15 0419  WBC 9.8 9.8 11.0* 10.5  NEUTROABS 8.0*  --   --   --   HGB 10.2* 9.6* 10.0* 10.6*  HCT 32.6* 29.9* 31.3* 33.9*  MCV 85.6 84.5 85.1 84.8  PLT PLATELET CLUMPS NOTED ON SMEAR, UNABLE TO ESTIMATE 248 261 262   CBG:  Recent Labs Lab 08/13/15 2150 08/14/15 0713 08/14/15 1204 08/14/15 1713 08/14/15 2052  GLUCAP 241* 217* 214* 175* 209*    Recent Results (from the past 240 hour(s))  MRSA PCR Screening     Status: None   Collection Time:  08/31/2015  1:00 PM  Result Value Ref Range Status   MRSA by PCR NEGATIVE NEGATIVE Final    Comment:        The GeneXpert MRSA Assay (FDA approved for NASAL specimens only), is one component of a comprehensive MRSA colonization surveillance program. It is not intended to diagnose MRSA infection nor to guide or monitor treatment for MRSA infections.      Scheduled Meds: . antiseptic oral rinse  7 mL Mouth Rinse BID  . aspirin  81 mg Oral Daily  . budesonide (PULMICORT) nebulizer solution  0.25 mg Nebulization BID  . cilostazol  100 mg Oral Daily  . cloNIDine  0.1 mg Oral Daily  . cycloSPORINE  1 drop Both Eyes BID  . DULoxetine  30 mg  Oral Daily  . furosemide  20 mg Intravenous BID  . gabapentin  200 mg Oral TID  . heparin  5,000 Units Subcutaneous Q8H  . hydrALAZINE  50 mg Oral BID  . insulin aspart  0-5 Units Subcutaneous QHS  . insulin aspart  0-9 Units Subcutaneous TID WC  . insulin NPH Human  15 Units Subcutaneous QHS  . insulin NPH Human  28 Units Subcutaneous QAC breakfast  . ipratropium-albuterol  3 mL Nebulization Q6H  . latanoprost  1 drop Both Eyes QHS  . levofloxacin (LEVAQUIN) IV  250 mg Intravenous Q24H  . lubiprostone  24 mcg Oral BID WC  . methylPREDNISolone (SOLU-MEDROL) injection  60 mg Intravenous 3 times per day  . metoprolol succinate  25 mg Oral Daily  . sertraline  50 mg Oral Daily  . simvastatin  20 mg Oral Daily  . sodium chloride  3 mL Intravenous Q12H  . sucralfate  1 g Oral BID  . timolol  1 drop Both Eyes BID   Continuous Infusions:        (

## 2015-08-15 NOTE — Progress Notes (Addendum)
Physical Therapy Treatment [name] MRN: [id number] DOB: 1936-04-26 Today's Date: 08/15/2015   SATURATION QUALIFICATIONS: (This note is used to comply with regulatory documentation for home oxygen)  Patient Saturations on Room Air at Rest = 99%  Patient Saturations on Room Air while Ambulating = 92%     History of Present Illness 79 y.o. female with a past medical history significant for COPD, CHF, diabetes mellitus, HTN, obesity, and hyperlipidemia and admitted on 9/12 with COPD exacerbation/acute on chronic diastolic heart failure    PT Comments    Progressing slowly with mobility. Unable to monitor pt's O2 sats throughout ambulation (did not have +2 assist for equipment (IV pole, dynamap, O2) management available. I had pt stop, on way back to room, to assess sat level-92% on RA, dyspnea 3/4. Once back in room and seated, checked sats a 2nd time, still 92% on RA.  May need to have nursing assess again prior to d/c to ensure accuracy.    Follow Up Recommendations  Home health PT;Supervision - Intermittent     Equipment Recommendations  None recommended by PT    Recommendations for Other Services       Precautions / Restrictions Precautions Precautions: Fall Precaution Comments: monitor sats Restrictions Weight Bearing Restrictions: No    Mobility  Bed Mobility Overal bed mobility: Modified Independent             General bed mobility comments: HOB elevated  Transfers Overall transfer level: Needs assistance Equipment used: Rolling walker (2 wheeled) Transfers: Sit to/from Stand Sit to Stand: Min guard         General transfer comment: close guard for safety. Increased time. VCs safety  Ambulation/Gait Ambulation/Gait assistance: Min guard Ambulation Distance (Feet): 65 Feet Assistive device: Rolling walker (2 wheeled) Gait Pattern/deviations: Trunk flexed;Decreased stride length;Step-through pattern     General Gait  Details: verbal cues for posture and RW Positioning, tends to keep RW too far forward. close guard for safety. 2 standing stops during walk-one to rest, one to allow for monitoring of O2 sats. Pt bending over and resting forearms on walker handles x 2 due to fatigue.    Stairs            Wheelchair Mobility    Modified Rankin (Stroke Patients Only)       Balance           Standing balance support: Bilateral upper extremity supported;During functional activity Standing balance-Leahy Scale: Poor Standing balance comment: needs RW                    Cognition Arousal/Alertness: Awake/alert Behavior During Therapy: WFL for tasks assessed/performed Overall Cognitive Status: Within Functional Limits for tasks assessed                      Exercises      General Comments        Pertinent Vitals/Pain Pain Assessment: 0-10 Pain Score: 8  Pain Location: bil LEs Pain Descriptors / Indicators: Aching;Sore Pain Intervention(s): Monitored during session;Limited activity within patient's tolerance;Repositioned    Home Living                      Prior Function            PT Goals (current goals can now be found in the care plan section) Progress towards PT goals: Progressing toward goals (slowly)    Frequency  Min 3X/week  PT Plan Current plan remains appropriate    Co-evaluation             End of Session   Activity Tolerance: Patient limited by fatigue Patient left: in bed;with call bell/phone within reach     Time: 1350-1416 PT Time Calculation (min) (ACUTE ONLY): 26 min  Charges:  $Gait Training: 8-22 mins $Therapeutic Activity: 8-22 mins                    G Codes:      [name], MPT Pager: [phone]

## 2015-08-16 LAB — BASIC METABOLIC PANEL
ANION GAP: 7 (ref 5–15)
BUN: 58 mg/dL — AB (ref 6–20)
CHLORIDE: 104 mmol/L (ref 101–111)
CO2: 30 mmol/L (ref 22–32)
Calcium: 9.3 mg/dL (ref 8.9–10.3)
Creatinine, Ser: 2.27 mg/dL — ABNORMAL HIGH (ref 0.44–1.00)
GFR calc Af Amer: 23 mL/min — ABNORMAL LOW (ref >60)
GFR calc non Af Amer: 20 mL/min — ABNORMAL LOW (ref >60)
Glucose, Bld: 193 mg/dL — ABNORMAL HIGH (ref 65–99)
POTASSIUM: 4.3 mmol/L (ref 3.5–5.1)
SODIUM: 141 mmol/L (ref 135–145)

## 2015-08-16 LAB — CBC
HCT: 33.6 % — ABNORMAL LOW (ref 36.0–46.0)
HEMOGLOBIN: 10.8 g/dL — AB (ref 12.0–15.0)
MCH: 26.8 pg (ref 26.0–34.0)
MCHC: 32.1 g/dL (ref 30.0–36.0)
MCV: 83.4 fL (ref 78.0–100.0)
Platelets: 257 10*3/uL (ref 150–400)
RBC: 4.03 MIL/uL (ref 3.87–5.11)
RDW: 15.3 % (ref 11.5–15.5)
WBC: 9 10*3/uL (ref 4.0–10.5)

## 2015-08-16 LAB — GLUCOSE, CAPILLARY
GLUCOSE-CAPILLARY: 168 mg/dL — AB (ref 65–99)
Glucose-Capillary: 198 mg/dL — ABNORMAL HIGH (ref 65–99)
Glucose-Capillary: 136 mg/dL — ABNORMAL HIGH (ref 65–99)
Glucose-Capillary: 166 mg/dL — ABNORMAL HIGH (ref 65–99)

## 2015-08-16 MED ORDER — METHYLPREDNISOLONE SODIUM SUCC 40 MG IJ SOLR
40.0000 mg | Freq: Every day | INTRAMUSCULAR | Status: DC
  Administered 2015-08-17: 40 mg via INTRAVENOUS
  Filled 2015-08-16: qty 1

## 2015-08-16 MED ORDER — SENNOSIDES-DOCUSATE SODIUM 8.6-50 MG PO TABS
1.0000 | ORAL_TABLET | Freq: Two times a day (BID) | ORAL | Status: DC
  Administered 2015-08-16 – 2015-08-17 (×3): 1 via ORAL
  Filled 2015-08-16 (×3): qty 1

## 2015-08-16 MED ORDER — MAGIC MOUTHWASH
5.0000 mL | Freq: Four times a day (QID) | ORAL | Status: DC
  Administered 2015-08-16 – 2015-08-17 (×3): 5 mL via ORAL
  Filled 2015-08-16 (×6): qty 5

## 2015-08-16 NOTE — Progress Notes (Signed)
Patient ID: Kensleigh Shawver, female   DOB: 1936-08-10, 79 y.o.   MRN: [id number]  [facility] PROGRESS NOTE  Heide Oelfke [id number] DOB: 1936/09/16 DOA: 08/12/2015 PCP: [name], NP   Brief narrative:    79 y.o. female with known COPD, CHF, DM, HTN, morbid obesity, presented to [facility] ED with main concern of several days duration off progressively worsening dyspnea that initially started with exertion and has progressed to dyspnea at rest. She was admitted for evaluation of COPD exacerbation as well as acute on chronic diastolic CHF.  In the ED, she was found to be hypoxic requiring NRB mask, Cr 2.0, elevated BNP to 149. CXR with potential pulmonary edema. She was given Lasix, steroids and breathing treatment.  Assessment/Plan:    Active Problems:   Acute hypoxic respiratory failure with hypercarbia, secondary to acute on chronic COPD exacerbation, acute on chronic diastolic CHF grade 2 - Patient is clinically improving with minimal crackles at bases and no wheezing  - maintaining oxygen saturation at target range on RA, no need for oxygen at this time  - Please see detailed management planned for COPD and CHF below    Acute on chronic diastolic congestive heart failure - Per 2-D echo in 2013, patient had grade 2 diastolic CHF - Repeat 2-D echo on this admission 08/13/2015 with EF 55-60%, could not clearly evaluate diastolic dysfunction due to body habitus - Patient weight is still 238 lbs - Continue Lasix 20 mg IV twice a day - Continue to monitor daily weights, strict I/O    Acute on chronic COPD exacerbation with acute bronchitis  - Still with mild expiratory wheezing on exam - taper down solumedrol to 40 mg IV QD 9was on 60 mg IV TID) - Continue broncho-dilators scheduled and as needed - continue Levaquin day #5/7 - Continue Pulmicort - keep off oxygen for now as it is not needed     Diabetes mellitus type 2 in morbidly obese pt, with complications of CKD  stage III, PVD, neuropathies - Currently on insulin NPH, 15 units in a.m. and 28 units in p.m. - reasonable inpatient control  - continue aspirin for PVD  - Continue Neurontin    Acute on chronic CKD stage III - With baseline creatinine around 1.7 - 1.9 based on recent blood work - Creatinine is up this time and possibly related to acute on chronic diastolic CHF, ? enalapril side effect, ? ATN - Continue to monitor closely - Avoid nephrotoxic medications especially enalapril - OK to continue Lasix     Anemia of chronic disease, CKD - no signs of active bleeding, monitor     Leukocytosis - steroid induced - resolved     Morbidly obese - Body mass index is 40.75 kg/(m^2)    Accelerated hypertension - Currently on clonidine, hydralazine, Lasix, Metoprolol  - Continue same regimen for now -SBP in 160's may need to increase the dose of Hydralazine in 24 hours if not SBP > 150    RLE Edema - Improving from yesterday  - repeat DVT US 08/12/2015 negative for DVT, prior studies all negative    Hyperlipidemia - Stable, last LDL 62 on 03/14/2015 - I have discussed with pt significance of LDL and target range  - Continue home medication simvastatin  DVT prophylaxis - Heparin SQ  Code Status: DNR Family Communication:  plan of care discussed with the patient Disposition Plan: Home when stable. HH PT order placed, reviewed PT notes and case manager notes.  IV access:  Peripheral IV  Procedures and diagnostic studies:    Dg Chest Port 1 View 09-09-2015  Cardiomegaly with increased interstitial prominence, may be related to lower lung volumes versus pulmonary edema. Suspect minimal fluid in the right minor fissure.     Medical Consultants:  None  Other Consultants:  PT  IAnti-Infectives:   Levaquin 09-Sep-2023 -->  Faye Ramsay, MD  [facility] Pager [phone]  If 7PM-7AM, please contact night-coverage [email or web address] Password [facility] 08/16/2015, 7:49 AM   LOS: 4 days    HPI/Subjective: No events overnight. Pt reports minimal dyspnea with exertion.  Objective: Filed Vitals:   08/15/15 2145 08/16/15 0121 08/16/15 0445 08/16/15 0721  BP: 173/67  166/77   Pulse: 67  66 67  Temp: 98.6 F (37 C)  97.6 F (36.4 C)   TempSrc: Oral  Oral   Resp: 18  18 18   Height:      Weight:   108.047 kg (238 lb 3.2 oz)   SpO2: 98% 92% 97% 96%    Intake/Output Summary (Last 24 hours) at 08/16/15 0749 Last data filed at 08/15/15 1700  Gross per 24 hour  Intake    720 ml  Output    850 ml  Net   -130 ml    Exam:   General:  Pt is alert, follows commands appropriately, not in acute distress, morbidly obese   Cardiovascular: Regular rate and rhythm, S1/S2, no murmurs, no rubs, no gallops  Respiratory: Clear to auscultation bilaterally, no wheezing, minimal bibasilar crackles   Abdomen: Soft, non tender, non distended, bowel sounds present, no guarding  Extremities: Trace bilateral LE pitting edema, pulses DP and PT palpable bilaterally  Neuro: Grossly nonfocal  Data Reviewed: Basic Metabolic Panel:  Recent Labs Lab 09/09/2015 0454 08/13/15 0544 08/14/15 0430 08/15/15 0419 08/16/15 0535  NA 143 141 140 141 141  K 4.0 4.1 4.2 4.0 4.3  CL 107 107 105 106 104  CO2 24 27 27 26 30   GLUCOSE 252* 216* 247* 248* 193*  BUN 30* 41* 51* 60* 58*  CREATININE 2.01* 2.05* 2.25* 2.30* 2.27*  CALCIUM 9.5 9.2 9.2 9.6 9.3   CBC:  Recent Labs Lab Sep 09, 2015 0454 08/13/15 0544 08/14/15 0430 08/15/15 0419 08/16/15 0535  WBC 9.8 9.8 11.0* 10.5 9.0  NEUTROABS 8.0*  --   --   --   --   HGB 10.2* 9.6* 10.0* 10.6* 10.8*  HCT 32.6* 29.9* 31.3* 33.9* 33.6*  MCV 85.6 84.5 85.1 84.8 83.4  PLT PLATELET CLUMPS NOTED ON SMEAR, UNABLE TO ESTIMATE 248 261 262 257   CBG:  Recent Labs Lab 08/14/15 2052 08/15/15 0732 08/15/15 1144 08/15/15 1655 08/15/15 2202  GLUCAP 209* 207* 270* 175* 192*    Recent Results (from the past 240 hour(s))  MRSA PCR Screening      Status: None   Collection Time: 2015-09-09  1:00 PM  Result Value Ref Range Status   MRSA by PCR NEGATIVE NEGATIVE Final    Comment:        The GeneXpert MRSA Assay (FDA approved for NASAL specimens only), is one component of a comprehensive MRSA colonization surveillance program. It is not intended to diagnose MRSA infection nor to guide or monitor treatment for MRSA infections.      Scheduled Meds: . antiseptic oral rinse  7 mL Mouth Rinse BID  . aspirin  81 mg Oral Daily  . budesonide (PULMICORT) nebulizer solution  0.25 mg Nebulization BID  . cilostazol  100 mg Oral Daily  .  cloNIDine  0.1 mg Oral Daily  . cycloSPORINE  1 drop Both Eyes BID  . DULoxetine  30 mg Oral Daily  . furosemide  20 mg Intravenous BID  . gabapentin  200 mg Oral TID  . heparin  5,000 Units Subcutaneous Q8H  . hydrALAZINE  50 mg Oral BID  . insulin aspart  0-5 Units Subcutaneous QHS  . insulin aspart  0-9 Units Subcutaneous TID WC  . insulin NPH Human  15 Units Subcutaneous QHS  . insulin NPH Human  28 Units Subcutaneous QAC breakfast  . ipratropium-albuterol  3 mL Nebulization Q6H  . latanoprost  1 drop Both Eyes QHS  . levofloxacin  250 mg Oral Daily  . lubiprostone  24 mcg Oral BID WC  . [START ON 08/17/2015] methylPREDNISolone (SOLU-MEDROL) injection  40 mg Intravenous Daily  . metoprolol succinate  25 mg Oral Daily  . sertraline  50 mg Oral Daily  . simvastatin  20 mg Oral Daily  . sodium chloride  3 mL Intravenous Q12H  . sucralfate  1 g Oral BID  . timolol  1 drop Both Eyes BID   Continuous Infusions:        (

## 2015-08-16 NOTE — Care Management Note (Signed)
Case Management Note  Patient Details  Name: Michelle Buck MRN: [id number] Date of Birth: 1936-10-01  Subjective/Objective:  No oxygen needed. Neb machine ordered. [facility] dme rep Lecretia aware, & has already brought neb machine to rm.   [facility] Kristen aware of [place].               Action/Plan:d/c home w/[facility]HC.   Expected Discharge Date:   (unknown)               Expected Discharge Plan:  [place]  In-House Referral:  Clinical Social Work  Discharge planning Services  CM Consult  Post Acute Care Choice:  Resumption of Svcs/PTA Provider ([place] private duty care giver services.) Choice offered to:  Patient  DME Arranged:  [facility] DME Agency:  [place] Arranged:  RN, PT, OT, Nurse's Aide [place] Agency:  [place]  Status of Service:  In process, will continue to follow  Medicare Important Message Given:  Yes-second notification given Date Medicare IM Given:    Medicare IM give by:    Date Additional Medicare IM Given:    Additional Medicare Important Message give by:     If discussed at [place] of Stay Meetings, dates discussed:    Additional Comments:  Dessa Phi, RN 08/16/2015, 2:11 PM

## 2015-08-17 LAB — BASIC METABOLIC PANEL
ANION GAP: 8 (ref 5–15)
BUN: 56 mg/dL — ABNORMAL HIGH (ref 6–20)
CALCIUM: 9.1 mg/dL (ref 8.9–10.3)
CO2: 31 mmol/L (ref 22–32)
CREATININE: 2.23 mg/dL — AB (ref 0.44–1.00)
Chloride: 101 mmol/L (ref 101–111)
GFR calc Af Amer: 23 mL/min — ABNORMAL LOW (ref >60)
GFR, EST NON AFRICAN AMERICAN: 20 mL/min — AB (ref >60)
GLUCOSE: 101 mg/dL — AB (ref 65–99)
Potassium: 3.3 mmol/L — ABNORMAL LOW (ref 3.5–5.1)
Sodium: 140 mmol/L (ref 135–145)

## 2015-08-17 LAB — GLUCOSE, CAPILLARY
Glucose-Capillary: 82 mg/dL (ref 65–99)
Glucose-Capillary: 156 mg/dL — ABNORMAL HIGH (ref 65–99)

## 2015-08-17 LAB — CBC
HCT: 36.1 % (ref 36.0–46.0)
Hemoglobin: 11.9 g/dL — ABNORMAL LOW (ref 12.0–15.0)
MCH: 27.3 pg (ref 26.0–34.0)
MCHC: 33 g/dL (ref 30.0–36.0)
MCV: 82.8 fL (ref 78.0–100.0)
PLATELETS: 258 10*3/uL (ref 150–400)
RBC: 4.36 MIL/uL (ref 3.87–5.11)
RDW: 15.5 % (ref 11.5–15.5)
WBC: 11.7 10*3/uL — AB (ref 4.0–10.5)

## 2015-08-17 MED ORDER — IPRATROPIUM-ALBUTEROL 0.5-2.5 (3) MG/3ML IN SOLN
3.0000 mL | Freq: Three times a day (TID) | RESPIRATORY_TRACT | Status: DC
  Administered 2015-08-17: 3 mL via RESPIRATORY_TRACT
  Filled 2015-08-17: qty 3

## 2015-08-17 MED ORDER — BUDESONIDE 0.25 MG/2ML IN SUSP: 0.2500 mg | Freq: Two times a day (BID) | RESPIRATORY_TRACT | Status: DC

## 2015-08-17 MED ORDER — PREDNISONE 10 MG PO TABS: ORAL_TABLET | ORAL | Status: DC

## 2015-08-17 MED ORDER — HYDROCODONE-ACETAMINOPHEN 10-325 MG PO TABS: 1.0000 | ORAL_TABLET | Freq: Three times a day (TID) | ORAL | Status: DC | PRN

## 2015-08-17 MED ORDER — FUROSEMIDE 40 MG PO TABS: 40.0000 mg | ORAL_TABLET | Freq: Every day | ORAL | Status: DC

## 2015-08-17 MED ORDER — IPRATROPIUM-ALBUTEROL 0.5-2.5 (3) MG/3ML IN SOLN: 3.0000 mL | RESPIRATORY_TRACT | Status: DC | PRN

## 2015-08-17 MED ORDER — LEVOFLOXACIN 250 MG PO TABS: 250.0000 mg | ORAL_TABLET | Freq: Every day | ORAL | Status: DC

## 2015-08-17 NOTE — Discharge Instructions (Signed)

## 2015-08-17 NOTE — Discharge Summary (Signed)
Physician Discharge Summary  Michelle Buck [id number] DOB: 1936-04-03 DOA: 08/12/2015  PCP: [name], NP  Admit date: 08/12/2015 Discharge date: 08/17/2015  Recommendations for Outpatient Follow-up:  1. Pt will need to follow up with PCP in 2-3 weeks post discharge 2. Please obtain BMP to evaluate electrolytes and kidney function 3. Please also check CBC to evaluate Hg and Hct levels 4. Pt needs to complete Levaquin therapy upon discharge 5. Pt also discharged on Prednisone taper upon discharge 6. Please note that Lisinopril was stopped until renal function recovers 7. Please also note that pt responded better to Lasix and Torsemide was stopped and replaced with Lasix   Discharge Diagnoses:  Active Problems:   Morbid obesity   Acute renal failure superimposed on stage 3 chronic kidney disease   Acute on chronic diastolic congestive heart failure   Diabetes mellitus type 2 in obese  Discharge Condition: Stable  Diet recommendation: Heart healthy diet discussed in details   Brief narrative:    79 y.o. female with known COPD, CHF, DM, HTN, morbid obesity, presented to [facility] ED with main concern of several days duration off progressively worsening dyspnea that initially started with exertion and has progressed to dyspnea at rest. She was admitted for evaluation of COPD exacerbation as well as acute on chronic diastolic CHF.  In the ED, she was found to be hypoxic requiring NRB mask, Cr 2.0, elevated BNP to 149. CXR with potential pulmonary edema. She was given Lasix, steroids and breathing treatment.  Assessment/Plan:    Active Problems:  Acute hypoxic respiratory failure with hypercarbia, secondary to acute on chronic COPD exacerbation, acute on chronic diastolic CHF grade 2 - Patient is clinically improving with minimal crackles at bases and no wheezing  - maintaining oxygen saturation at target range on RA, no need for oxygen at this time  - Please see  detailed management planned for COPD and CHF below   Acute on chronic diastolic congestive heart failure - Per 2-D echo in 2013, patient had grade 2 diastolic CHF - Repeat 2-D echo on this admission 08/13/2015 with EF 55-60%, could not clearly evaluate diastolic dysfunction due to body habitus - Patient weight is still 238 lbs - Continue Lasix upon discharge  - Continue to monitor weights at home, pt educated on how to keep record and also to call her PCP if weight goes up by more than 3-4 lbs in 24-48 hours    Acute on chronic COPD exacerbation with acute bronchitis  - no wheezing on exam this AM - continue with prednisone taper - Continue broncho-dilators scheduled and as needed - continue Levaquin to complete therapy  - Continue Pulmicort   Diabetes mellitus type 2 in morbidly obese pt, with complications of CKD stage III, PVD, neuropathies - continue Insulin as per home medical regimen  - reasonable inpatient control  - continue aspirin for PVD  - Continue Neurontin   Acute on chronic CKD stage III - With baseline creatinine around 1.7 - 1.9 based on recent blood work - Creatinine is up this time and possibly related to acute on chronic diastolic CHF, ? enalapril side effect, ? ATN - Avoid nephrotoxic medications especially enalapril - OK to continue Lasix as renal function has been stable on Lasix    Anemia of chronic disease, CKD - no signs of active bleeding, monitor in an outpatient setting    Leukocytosis - steroid induced - resolved    Morbidly obese - Body mass index is 40.75 kg/(m^2)  Accelerated hypertension - Currently on clonidine, hydralazine, Lasix, Metoprolol  - Continue same regimen for now   RLE Edema - Improving from yesterday  - repeat DVT US 08/12/2015 negative for DVT, prior studies all negative   Hyperlipidemia - Stable, last LDL 62 on 03/14/2015 - I have discussed with pt significance of LDL and target range  - Continue home  medication simvastatin  Code Status: DNR Family Communication: plan of care discussed with the patient Disposition Plan: Home   IV access:  Peripheral IV  Procedures and diagnostic studies:   Dg Chest Port 1 View 08/12/2015 Cardiomegaly with increased interstitial prominence, may be related to lower lung volumes versus pulmonary edema. Suspect minimal fluid in the right minor fissure.   Medical Consultants:  None  Other Consultants:  PT  IAnti-Infectives:   Levaquin 9/12 -->      Discharge Exam: Filed Vitals:   08/17/15 0949  BP:   Pulse: 71  Temp:   Resp:    Filed Vitals:   08/17/15 0505 08/17/15 0845 08/17/15 0942 08/17/15 0949  BP: 162/92  165/72   Pulse: 69   71  Temp: 98.5 F (36.9 C)     TempSrc: Oral     Resp: 20     Height:      Weight: 107.956 kg (238 lb)     SpO2: 96% 90%      General: Pt is alert, follows commands appropriately, not in acute distress Cardiovascular: Regular rate and rhythm, S1/S2 +, no murmurs, no rubs, no gallops Respiratory: Clear to auscultation bilaterally, no wheezing, no crackles, no rhonchi Abdominal: Soft, non tender, non distended, bowel sounds +, no guarding Extremities: trace bilateral LE edema, no cyanosis, pulses palpable bilaterally DP and PT  Discharge Instructions  Discharge Instructions    Diet - low sodium heart healthy    Complete by:  As directed      Increase activity slowly    Complete by:  As directed             Medication List    STOP taking these medications        enalapril 10 MG tablet  Commonly known as:  VASOTEC     meloxicam 15 MG tablet  Commonly known as:  MOBIC     torsemide 20 MG tablet  Commonly known as:  DEMADEX      TAKE these medications        ADVAIR DISKUS 250-50 MCG/DOSE Aepb  Generic drug:  Fluticasone-Salmeterol  INHALE 1 PUFF BY MOUTH INTO THE LUNGS TWICE DAILY     albuterol 108 (90 BASE) MCG/ACT inhaler  Commonly known as:  PROVENTIL  HFA;VENTOLIN HFA  Inhale 1 puff into the lungs every 6 (six) hours as needed for wheezing or shortness of breath.     aspirin 81 MG tablet  Take 81 mg by mouth daily.     budesonide 0.25 MG/2ML nebulizer solution  Commonly known as:  PULMICORT  Take 2 mLs (0.25 mg total) by nebulization 2 (two) times daily.     cilostazol 100 MG tablet  Commonly known as:  PLETAL  TAKE 1 TABLET BY MOUTH EVERY DAY     cloNIDine 0.1 MG tablet  Commonly known as:  CATAPRES  TAKE 1 TABLET BY MOUTH EVERY DAY     cycloSPORINE 0.05 % ophthalmic emulsion  Commonly known as:  RESTASIS  1 drop 2 (two) times daily. One drop once a day bilaterally for dry eyes     diclofenac  sodium 1 % Gel  Commonly known as:  VOLTAREN  Apply 4 g topically 2 (two) times daily as needed. For pain     DULoxetine 30 MG capsule  Commonly known as:  CYMBALTA  TAKE 1 CAPSULE BY MOUTH EVERY DAY TO HELP ANXIETY AND PAINS     EASY TOUCH INSULIN SYRINGE 31G X 5/16" 0.5 ML Misc  Generic drug:  Insulin Syringe-Needle U-100  USE UP TO 2 TIMES A DAY AS DIRECTED     esomeprazole 40 MG capsule  Commonly known as:  NEXIUM  Take 1 capsule (40 mg total) by mouth daily.     furosemide 40 MG tablet  Commonly known as:  LASIX  Take 1 tablet (40 mg total) by mouth daily.     gabapentin 100 MG capsule  Commonly known as:  NEURONTIN  Take 2 capsules (200 mg total) by mouth 3 (three) times daily.     guaifenesin 100 MG/5ML syrup  Commonly known as:  ROBITUSSIN  Take 5-10 mLs (100-200 mg total) by mouth every 4 (four) hours as needed for cough.     hydrALAZINE 50 MG tablet  Commonly known as:  APRESOLINE  TAKE 1 TABLET BY MOUTH TWICE DAILY     HYDROcodone-acetaminophen 10-325 MG per tablet  Commonly known as:  NORCO  Take 1 tablet by mouth every 8 (eight) hours as needed for moderate pain.     insulin NPH-regular Human (70-30) 100 UNIT/ML injection  Commonly known as:  HUMULIN 70/30  Inject 40 units subcutaneously with breakfast  and 20 units with dinner to control blood sugar.     ipratropium-albuterol 0.5-2.5 (3) MG/3ML Soln  Commonly known as:  DUONEB  Take 3 mLs by nebulization every 4 (four) hours as needed.     levofloxacin 250 MG tablet  Commonly known as:  LEVAQUIN  Take 1 tablet (250 mg total) by mouth daily.     lubiprostone 24 MCG capsule  Commonly known as:  AMITIZA  Take 1 capsule (24 mcg total) by mouth 2 (two) times daily with a meal.     metoprolol succinate 25 MG 24 hr tablet  Commonly known as:  TOPROL-XL  TAKE 1 TABLET BY MOUTH EVERY DAY WITH OR IMMEDIATELY FOLLOWING A MEAL FOR BLOOD PRESSURE     potassium chloride SA 20 MEQ tablet  Commonly known as:  K-DUR,KLOR-CON  TAKE 1 TABLET BY MOUTH EVERY DAY     predniSONE 10 MG tablet  Commonly known as:  DELTASONE  Take 50 mg tablet today and taper down by 10 mg daily until completed     sertraline 50 MG tablet  Commonly known as:  ZOLOFT  TAKE 1 TABLET BY MOUTH EVERY DAY     simvastatin 20 MG tablet  Commonly known as:  ZOCOR  TAKE 1 TABLET BY MOUTH EVERY DAY     sucralfate 1 G tablet  Commonly known as:  CARAFATE  TAKE 1 TABLET BY MOUTH TWICE DAILY ON EMPTY STOMACH     timolol 0.5 % ophthalmic solution  Commonly known as:  TIMOPTIC  Place 1 drop into both eyes 2 (two) times daily. One drop in bilaterally twice a day as needed     Travoprost (BAK Free) 0.004 % Soln ophthalmic solution  Commonly known as:  TRAVATAN  Place 1 drop into both eyes at bedtime. One drop bilaterally twice a day           Follow-up Information    Follow up with EUBANKS, JESSICA K,  NP. Go on 08/20/2015.   Specialty:  Nurse Practitioner   Why:  At 10:45am for post hospitalization follow-up   Contact information:   [place]. [place] [address] [phone]       Follow up with [facility].   Why:  neb machine   Contact information:   [place] [address] [phone]       Follow up with  [facility].   Why:  HHRN/HHPT/HHOT/HH Nurse's aide.   Contact information:   [address] [place] [address] [phone]       Follow up with [name] K, NP.   Specialty:  Nurse Practitioner   Contact information:   [address]. [place] [address] [phone]        The results of significant diagnostics from this hospitalization (including imaging, microbiology, ancillary and laboratory) are listed below for reference.     Microbiology: Recent Results (from the past 240 hour(s))  MRSA PCR Screening     Status: None   Collection Time: 08/12/15  1:00 PM  Result Value Ref Range Status   MRSA by PCR NEGATIVE NEGATIVE Final    Comment:        The GeneXpert MRSA Assay (FDA approved for NASAL specimens only), is one component of a comprehensive MRSA colonization surveillance program. It is not intended to diagnose MRSA infection nor to guide or monitor treatment for MRSA infections.      Labs: Basic Metabolic Panel:  Recent Labs Lab 08/13/15 0544 08/14/15 0430 08/15/15 0419 08/16/15 0535 08/17/15 0444  NA 141 140 141 141 140  K 4.1 4.2 4.0 4.3 3.3*  CL 107 105 106 104 101  CO2 27 27 26 30 31   GLUCOSE 216* 247* 248* 193* 101*  BUN 41* 51* 60* 58* 56*  CREATININE 2.05* 2.25* 2.30* 2.27* 2.23*  CALCIUM 9.2 9.2 9.6 9.3 9.1    CBC:  Recent Labs Lab 08/12/15 0454 08/13/15 0544 08/14/15 0430 08/15/15 0419 08/16/15 0535 08/17/15 0444  WBC 9.8 9.8 11.0* 10.5 9.0 11.7*  NEUTROABS 8.0*  --   --   --   --   --   HGB 10.2* 9.6* 10.0* 10.6* 10.8* 11.9*  HCT 32.6* 29.9* 31.3* 33.9* 33.6* 36.1  MCV 85.6 84.5 85.1 84.8 83.4 82.8  PLT PLATELET CLUMPS NOTED ON SMEAR, UNABLE TO ESTIMATE 248 261 262 257 258    BNP (last 3 results)  Recent Labs  08/12/15 0454  BNP 149.2*    ProBNP (last 3 results)  Recent Labs  10/15/14 1536  PROBNP 110.2    CBG:  Recent Labs Lab 08/16/15 0813 08/16/15 1243  08/16/15 1710 08/16/15 2149 08/17/15 0723  GLUCAP 168* 198* 136* 166* 82   SIGNED: Time coordinating discharge: 30 minutes  MAGICK-Onelia Cadmus, MD  [facility] 08/17/2015, 10:26 AM Pager [phone]  If 7PM-7AM, please contact night-coverage [email or web address] Password [email or web address]

## 2015-08-19 DIAGNOSIS — I129 Hypertensive chronic kidney disease with stage 1 through stage 4 chronic kidney disease, or unspecified chronic kidney disease: Secondary | ICD-10-CM | POA: Diagnosis not present

## 2015-08-19 DIAGNOSIS — N183 Chronic kidney disease, stage 3 (moderate): Secondary | ICD-10-CM | POA: Diagnosis not present

## 2015-08-19 DIAGNOSIS — E1169 Type 2 diabetes mellitus with other specified complication: Secondary | ICD-10-CM | POA: Diagnosis not present

## 2015-08-19 DIAGNOSIS — J441 Chronic obstructive pulmonary disease with (acute) exacerbation: Secondary | ICD-10-CM | POA: Diagnosis not present

## 2015-08-19 DIAGNOSIS — E1122 Type 2 diabetes mellitus with diabetic chronic kidney disease: Secondary | ICD-10-CM | POA: Diagnosis not present

## 2015-08-19 DIAGNOSIS — E1140 Type 2 diabetes mellitus with diabetic neuropathy, unspecified: Secondary | ICD-10-CM | POA: Diagnosis not present

## 2015-08-19 DIAGNOSIS — E785 Hyperlipidemia, unspecified: Secondary | ICD-10-CM | POA: Diagnosis not present

## 2015-08-19 DIAGNOSIS — I5033 Acute on chronic diastolic (congestive) heart failure: Secondary | ICD-10-CM | POA: Diagnosis not present

## 2015-08-19 DIAGNOSIS — Z6839 Body mass index (BMI) 39.0-39.9, adult: Secondary | ICD-10-CM | POA: Diagnosis not present

## 2015-08-19 DIAGNOSIS — Z794 Long term (current) use of insulin: Secondary | ICD-10-CM | POA: Diagnosis not present

## 2015-08-20 ENCOUNTER — Ambulatory Visit (INDEPENDENT_AMBULATORY_CARE_PROVIDER_SITE_OTHER): Payer: Medicare Other | Admitting: Nurse Practitioner

## 2015-08-20 ENCOUNTER — Telehealth: Payer: Self-pay

## 2015-08-20 ENCOUNTER — Encounter: Payer: Self-pay | Admitting: Nurse Practitioner

## 2015-08-20 ENCOUNTER — Telehealth: Payer: Self-pay | Admitting: *Deleted

## 2015-08-20 VITALS — BP 132/78 | HR 64 | Temp 97.8°F | Resp 14 | Ht 64.0 in | Wt 231.0 lb

## 2015-08-20 DIAGNOSIS — Z23 Encounter for immunization: Secondary | ICD-10-CM

## 2015-08-20 DIAGNOSIS — D631 Anemia in chronic kidney disease: Secondary | ICD-10-CM | POA: Diagnosis not present

## 2015-08-20 DIAGNOSIS — E1165 Type 2 diabetes mellitus with hyperglycemia: Secondary | ICD-10-CM | POA: Diagnosis not present

## 2015-08-20 DIAGNOSIS — E1129 Type 2 diabetes mellitus with other diabetic kidney complication: Secondary | ICD-10-CM

## 2015-08-20 DIAGNOSIS — I10 Essential (primary) hypertension: Secondary | ICD-10-CM | POA: Diagnosis not present

## 2015-08-20 DIAGNOSIS — N183 Chronic kidney disease, stage 3 (moderate): Secondary | ICD-10-CM

## 2015-08-20 DIAGNOSIS — I5033 Acute on chronic diastolic (congestive) heart failure: Secondary | ICD-10-CM

## 2015-08-20 DIAGNOSIS — J441 Chronic obstructive pulmonary disease with (acute) exacerbation: Secondary | ICD-10-CM | POA: Diagnosis not present

## 2015-08-20 DIAGNOSIS — N189 Chronic kidney disease, unspecified: Secondary | ICD-10-CM

## 2015-08-20 DIAGNOSIS — IMO0002 Reserved for concepts with insufficient information to code with codable children: Secondary | ICD-10-CM

## 2015-08-20 MED ORDER — BUDESONIDE 0.25 MG/2ML IN SUSP: 0.2500 mg | Freq: Two times a day (BID) | RESPIRATORY_TRACT | Status: DC

## 2015-08-20 MED ORDER — IPRATROPIUM-ALBUTEROL 0.5-2.5 (3) MG/3ML IN SOLN: 3.0000 mL | RESPIRATORY_TRACT | Status: DC | PRN

## 2015-08-20 NOTE — Telephone Encounter (Signed)
Patient caregiver stated [facility] is unable to process the medication, DuoNeb and Pulmicort. Stated that it doesn't have the right code to process it. I called the pharmacy and they gave me the number to call for prior authorization #[id number] and I spoke with [facility] and the medications were denied and patient is not eligible for Part B coverage. Needs it changed to inhaler. Please Advise.  ID#: [id number]

## 2015-08-20 NOTE — Telephone Encounter (Signed)
I tried calling [facility] on multiple occasions and was unable to connect with a human being. I will try again tomorrow

## 2015-08-20 NOTE — Telephone Encounter (Signed)
Please try to appeal , Pt is unable to follow directions for hand inhalers to get benefit of medications. Also not able to take deep breath in to get medication to lungs

## 2015-08-20 NOTE — Progress Notes (Signed)
Patient ID: Michelle Buck, female   DOB: [birth year], 79 y.o.   MRN: [id number]    PCP: [name], NP  Advanced Directive information    Allergies  Allergen Reactions  . Tramadol Other (See Comments)    Leg cramps   . Codeine Nausea And Vomiting    Patient states N/V with codeine  . Hydrocodone Nausea And Vomiting  . Oxycodone Other (See Comments)    Patient states she can tolerate oxycodone  . Tylenol [Acetaminophen] Hives  . Penicillins Rash    Patient states rash/itch with penicillin    Chief Complaint  Patient presents with  . Hospitalization Follow-up    Seen in Hospital on 08/12/15 for resp failure. Patient still with SOB   . Medication Management    Dicuss medication for Nebulizer (patient never received rx's)   . FYI    Here with [name] (sister)     HPI: Patient is a 79 y.o. female seen in the office today to follow up hospitalization. Pt with known COPD, CHF, DM, HTN, morbid obesity. Pt went to the hospital with main concern of several days duration off progressively worsening dyspnea that initially started with exertion and has progressed to dyspnea at rest. She was hospitalized for evaluation of COPD exacerbation as well as acute on chronic diastolic CHF. Weaned pt off O2 in hospital. Changed pt over to lasix due to better diuresis.  Placed pt on prednisone taper and levoquin and neb treatments. Did not get her nebulizer because the pharmacy could not process it. Not doing well on inhalers because she can not use them right. Can not take a deep breath in with the inhalers and this is why she needs nebulizer.  Has not been weighing herself at home. Weight is down from hospital. Does not follow up with cardiology, never had a cardiologist.  Blood pressure 118/67 yesterday Home health nursing, PT/OT coming out to her house.  Neuropathy is bad,  Only taking gabapentin 100 mg TID, vs 200 mg TID. Thought it was too much to take 2, unsure why. Sister reports they will  try this.   Review of Systems:  Review of Systems  Constitutional: Negative for fever, chills, diaphoresis, appetite change and unexpected weight change.  HENT: Negative for congestion, ear discharge and ear pain.   Eyes: Negative for visual disturbance.  Respiratory: Positive for cough and shortness of breath (with exertion ). Negative for wheezing.   Cardiovascular: Negative for chest pain and palpitations.  Gastrointestinal: Positive for constipation (controlled on medication).  Genitourinary: Negative for dysuria.  Musculoskeletal: Positive for back pain, arthralgias and neck pain.  Skin: Negative for color change, rash and wound.  Neurological: Positive for numbness (neuropathy to hands and legs). Negative for dizziness, facial asymmetry, light-headedness and headaches.  Hematological: Negative for adenopathy.  Psychiatric/Behavioral: Negative for behavioral problems, dysphoric mood and agitation.    Past Medical History  Diagnosis Date  . Diabetes mellitus   . Hypertension   . Obese   . Hiatal hernia   . Scoliosis   . Arthritis   . Acute bronchitis   . COPD (chronic obstructive pulmonary disease)   . Unspecified hereditary and idiopathic peripheral neuropathy   . Secondary diabetes mellitus with renal manifestations, not stated as uncontrolled, or unspecified   . Chronic kidney disease, stage II (mild)   . Chronic kidney disease, stage II (mild)   . Chronic kidney disease, unspecified   . Disorder of bone and cartilage, unspecified   . Complications affecting  other specified body systems, hypertension   . Hypopotassemia   . Anxiety   . Congestive heart failure, unspecified   . GERD (gastroesophageal reflux disease)   . Lumbago   . Insomnia, unspecified   . Hyperlipidemia   . Unspecified glaucoma   . Diaphragmatic hernia without mention of obstruction or gangrene   . Osteoarthrosis, unspecified whether generalized or localized, unspecified site   . Degenerative  arthritis of knee, bilateral    Past Surgical History  Procedure Laterality Date  . Abdominal hysterectomy    . Shoulder surgery    . Total hip arthroplasty      bilateral  . Knee arthroscopy      bilateral  . Colonoscopy  08/06/2011  . Foot surgery     Social History:   reports that she quit smoking about 18 years ago. Her smoking use included Cigarettes. She has never used smokeless tobacco. She reports that she does not drink alcohol or use illicit drugs.  Family History  Problem Relation Age of Onset  . Diabetes Mother   . Heart disease Mother   . Heart disease Brother   . Diabetes Brother   . Kidney disease Father   . Scoliosis Sister   . Diabetes Brother   . Diabetes Brother   . Heart disease Brother   . Scoliosis Brother   . Stroke Brother   . Heart attack Brother     Medications: Patient's Medications  New Prescriptions   No medications on file  Previous Medications   ALBUTEROL (PROVENTIL HFA;VENTOLIN HFA) 108 (90 BASE) MCG/ACT INHALER    Inhale 1 puff into the lungs every 6 (six) hours as needed for wheezing or shortness of breath.   ASPIRIN 81 MG TABLET    Take 81 mg by mouth daily.     BUDESONIDE (PULMICORT) 0.25 MG/2ML NEBULIZER SOLUTION    Take 2 mLs (0.25 mg total) by nebulization 2 (two) times daily.   CILOSTAZOL (PLETAL) 100 MG TABLET    TAKE 1 TABLET BY MOUTH EVERY DAY   CLONIDINE (CATAPRES) 0.1 MG TABLET    TAKE 1 TABLET BY MOUTH EVERY DAY   CYCLOSPORINE (RESTASIS) 0.05 % OPHTHALMIC EMULSION    1 drop 2 (two) times daily. One drop once a day bilaterally for dry eyes   DICLOFENAC SODIUM (VOLTAREN) 1 % GEL    Apply 4 g topically 2 (two) times daily as needed. For pain   DULOXETINE (CYMBALTA) 30 MG CAPSULE    TAKE 1 CAPSULE BY MOUTH EVERY DAY TO HELP ANXIETY AND PAINS   EASY TOUCH INSULIN SYRINGE 31G X 5/16" 0.5 ML MISC    USE UP TO 2 TIMES A DAY AS DIRECTED   ESOMEPRAZOLE (NEXIUM) 40 MG CAPSULE    Take 1 capsule (40 mg total) by mouth daily.   FUROSEMIDE  (LASIX) 40 MG TABLET    Take 1 tablet (40 mg total) by mouth daily.   GABAPENTIN (NEURONTIN) 100 MG CAPSULE    Take 2 capsules (200 mg total) by mouth 3 (three) times daily.   GUAIFENESIN (ROBITUSSIN) 100 MG/5ML SYRUP    Take 5-10 mLs (100-200 mg total) by mouth every 4 (four) hours as needed for cough.   HYDRALAZINE (APRESOLINE) 50 MG TABLET    TAKE 1 TABLET BY MOUTH TWICE DAILY   HYDROCODONE-ACETAMINOPHEN (NORCO) 10-325 MG PER TABLET    Take 1 tablet by mouth every 8 (eight) hours as needed for moderate pain.   INSULIN NPH-REGULAR HUMAN (HUMULIN 70/30) (70-30) 100 UNIT/ML  INJECTION    Inject 40 units subcutaneously with breakfast and 20 units with dinner to control blood sugar.   IPRATROPIUM-ALBUTEROL (DUONEB) 0.5-2.5 (3) MG/3ML SOLN    Take 3 mLs by nebulization every 4 (four) hours as needed.   LEVOFLOXACIN (LEVAQUIN) 250 MG TABLET    Take 1 tablet (250 mg total) by mouth daily.   LUBIPROSTONE (AMITIZA) 24 MCG CAPSULE    Take 1 capsule (24 mcg total) by mouth 2 (two) times daily with a meal.   METOPROLOL SUCCINATE (TOPROL-XL) 25 MG 24 HR TABLET    TAKE 1 TABLET BY MOUTH EVERY DAY WITH OR IMMEDIATELY FOLLOWING A MEAL FOR BLOOD PRESSURE   POTASSIUM CHLORIDE SA (K-DUR,KLOR-CON) 20 MEQ TABLET    TAKE 1 TABLET BY MOUTH EVERY DAY   PREDNISONE (DELTASONE) 10 MG TABLET    Take 50 mg tablet today and taper down by 10 mg daily until completed   SERTRALINE (ZOLOFT) 50 MG TABLET    TAKE 1 TABLET BY MOUTH EVERY DAY   SIMVASTATIN (ZOCOR) 20 MG TABLET    TAKE 1 TABLET BY MOUTH EVERY DAY   SUCRALFATE (CARAFATE) 1 G TABLET    TAKE 1 TABLET BY MOUTH TWICE DAILY ON EMPTY STOMACH   TIMOLOL (TIMOPTIC) 0.5 % OPHTHALMIC SOLUTION    Place 1 drop into both eyes 2 (two) times daily. One drop in bilaterally twice a day as needed   TRAVOPROST, BAK FREE, (TRAVATAN) 0.004 % SOLN OPHTHALMIC SOLUTION    Place 1 drop into both eyes at bedtime. One drop bilaterally twice a day  Modified Medications   No medications on file    Discontinued Medications   ADVAIR DISKUS 250-50 MCG/DOSE AEPB    INHALE 1 PUFF BY MOUTH INTO THE LUNGS TWICE DAILY     Physical Exam:  Filed Vitals:   08/20/15 1058  BP: 132/78  Pulse: 64  Temp: 97.8 F (36.6 C)  TempSrc: Oral  Resp: 14  Height: 5\' 4"  (1.626 m)  Weight: 231 lb (104.781 kg)  SpO2: 93%   Body mass index is 39.63 kg/(m^2).  Physical Exam  Constitutional: She is oriented to person, place, and time. She appears well-developed and well-nourished. No distress.  Obese AA female, in NAD  HENT:  Head: Normocephalic and atraumatic.  Mouth/Throat: Oropharynx is clear and moist. No oropharyngeal exudate.  Eyes: Conjunctivae are normal. Pupils are equal, round, and reactive to light.  Neck: Normal range of motion. Neck supple.  Cardiovascular: Normal rate, regular rhythm and normal heart sounds.   Pulmonary/Chest: Effort normal.  diminished  Abdominal: Soft. Bowel sounds are normal.  Musculoskeletal: She exhibits no edema.  Uses rolling walker   Neurological: She is alert and oriented to person, place, and time.  Skin: Skin is warm and dry. She is not diaphoretic.  Psychiatric: She has a normal mood and affect.    Labs reviewed: Basic Metabolic Panel:  Recent Labs  08/15/15 0419 08/16/15 0535 08/17/15 0444  NA 141 141 140  K 4.0 4.3 3.3*  CL 106 104 101  CO2 26 30 31   GLUCOSE 248* 193* 101*  BUN 60* 58* 56*  CREATININE 2.30* 2.27* 2.23*  CALCIUM 9.6 9.3 9.1   Liver Function Tests:  Recent Labs  10/15/14 1438 03/14/15 0806 07/02/15 1026  AST 14 20 10   ALT 7 9 6   ALKPHOS 101 98 109  BILITOT 0.3 <0.2 <0.2  PROT 8.0 6.7 6.6  ALBUMIN 3.5  --   --    No results for  input(s): LIPASE, AMYLASE in the last 8760 hours. No results for input(s): AMMONIA in the last 8760 hours. CBC:  Recent Labs  10/15/14 1438 07/02/15 1026  08/12/15 0454  08/15/15 0419 08/16/15 0535 08/17/15 0444  WBC 8.4 6.9  < > 9.8  < > 10.5 9.0 11.7*  NEUTROABS 6.4 4.4   --  8.0*  --   --   --   --   HGB 12.1  --   < > 10.2*  < > 10.6* 10.8* 11.9*  HCT 37.0 33.3*  < > 32.6*  < > 33.9* 33.6* 36.1  MCV 81.5  --   < > 85.6  < > 84.8 83.4 82.8  PLT 230  --   < > PLATELET CLUMPS NOTED ON SMEAR, UNABLE TO ESTIMATE  < > 262 257 258  < > = values in this interval not displayed. Lipid Panel:  Recent Labs  02/19/15 1057 03/14/15 0806  CHOL 174 147  HDL 44 41  LDLCALC 75 62  TRIG 275* 219*  CHOLHDL 4.0 3.6   TSH: No results for input(s): TSH in the last 8760 hours. A1C: Lab Results  Component Value Date   HGBA1C 6.6* 08/12/2015     Assessment/Plan 1. COPD exacerbation - continue with prednisone taper - Continue broncho-dilators scheduled and as needed - continue Levaquin to complete therapy  - budesonide (PULMICORT) 0.25 MG/2ML nebulizer solution; Take 2 mLs (0.25 mg total) by nebulization 2 (two) times daily.  Dispense: 60 mL; Refill: 1 - ipratropium-albuterol (DUONEB) 0.5-2.5 (3) MG/3ML SOLN; Take 3 mLs by nebulization every 4 (four) hours as needed.  Dispense: 360 mL; Refill: 3  2. Acute on chronic diastolic congestive heart failure -Per 2-D echo in 2013, patient had grade 2 diastolic CHF - Repeat 2-D echo on this admission 08/13/2015 with EF 55-60% - has not had routine cardiology follow up, will get referral at this time -Ambulatory referral to Cardiology -weight down another 7 lbs since discharge -to cont lasix -will follow up BMP today  3. Essential hypertension, benign -blood pressure controlled, conts off enalapril   4. DM type 2, uncontrolled, with renal complications -controlled, medication was not changed in hospital. Conts on NPH 70/30  5. CKD (chronic kidney disease), stage 3 (moderate) -avoiding nephrotoxic medication, to stay hydrated, lisinopril stopped in hospital.  - Comprehensive metabolic panel  6. Anemia in chronic kidney disease - no signs of bleeding, will follow up CBC with Differential  7. Encounter for  immunization -influenza vaccine given today.   Keep scheduled follow up in November   Jessica K. Harle Battiest  [facility] [phone] 8 am - 5 pm) [phone] (after hours)

## 2015-08-20 NOTE — Telephone Encounter (Signed)
Called and spoke with Michelle Buck at [employer] and he stated that patient would be eligible for Part B. He stated that there is no loop hole around getting it covered through their insurance unless she was in long term facility. I called the Caregiver, [name] and she stated that she will check into it and see if patient has a Part B coverage and call us back.

## 2015-08-20 NOTE — Patient Instructions (Signed)
Will send referral to cardiology due to recent heart failure exacerbation.  Also will see if home health nursing can incorporate education regarding your heart failure  Will get blood work today Prescriptions sent to pharmacy regarding your nebulizer, call with any problems or questions  Keep follow ups

## 2015-08-20 NOTE — Telephone Encounter (Signed)
Patient seen in office today, per [facility] call [facility] [phone]) and have them add to patient's home care orders:  Patient to received CHF education.

## 2015-08-21 ENCOUNTER — Emergency Department (HOSPITAL_COMMUNITY): Payer: Medicare Other

## 2015-08-21 ENCOUNTER — Emergency Department (HOSPITAL_COMMUNITY)
Admission: EM | Admit: 2015-08-21 | Discharge: 2015-08-21 | Disposition: A | Discharge status: Home / Self Care | Payer: Medicare Other | Attending: Emergency Medicine | Admitting: Emergency Medicine

## 2015-08-21 ENCOUNTER — Encounter (HOSPITAL_COMMUNITY): Payer: Self-pay

## 2015-08-21 DIAGNOSIS — J441 Chronic obstructive pulmonary disease with (acute) exacerbation: Secondary | ICD-10-CM

## 2015-08-21 DIAGNOSIS — I509 Heart failure, unspecified: Secondary | ICD-10-CM | POA: Insufficient documentation

## 2015-08-21 DIAGNOSIS — K219 Gastro-esophageal reflux disease without esophagitis: Secondary | ICD-10-CM | POA: Diagnosis not present

## 2015-08-21 DIAGNOSIS — M170 Bilateral primary osteoarthritis of knee: Secondary | ICD-10-CM | POA: Diagnosis not present

## 2015-08-21 DIAGNOSIS — N182 Chronic kidney disease, stage 2 (mild): Secondary | ICD-10-CM | POA: Diagnosis not present

## 2015-08-21 DIAGNOSIS — R748 Abnormal levels of other serum enzymes: Secondary | ICD-10-CM | POA: Diagnosis not present

## 2015-08-21 DIAGNOSIS — G4700 Insomnia, unspecified: Secondary | ICD-10-CM | POA: Insufficient documentation

## 2015-08-21 DIAGNOSIS — I129 Hypertensive chronic kidney disease with stage 1 through stage 4 chronic kidney disease, or unspecified chronic kidney disease: Secondary | ICD-10-CM | POA: Insufficient documentation

## 2015-08-21 DIAGNOSIS — H409 Unspecified glaucoma: Secondary | ICD-10-CM | POA: Diagnosis not present

## 2015-08-21 DIAGNOSIS — Z792 Long term (current) use of antibiotics: Secondary | ICD-10-CM | POA: Insufficient documentation

## 2015-08-21 DIAGNOSIS — Z880 Allergy status to penicillin: Secondary | ICD-10-CM | POA: Insufficient documentation

## 2015-08-21 DIAGNOSIS — R079 Chest pain, unspecified: Secondary | ICD-10-CM | POA: Diagnosis not present

## 2015-08-21 DIAGNOSIS — Z79899 Other long term (current) drug therapy: Secondary | ICD-10-CM | POA: Insufficient documentation

## 2015-08-21 DIAGNOSIS — R7989 Other specified abnormal findings of blood chemistry: Secondary | ICD-10-CM | POA: Diagnosis not present

## 2015-08-21 DIAGNOSIS — F419 Anxiety disorder, unspecified: Secondary | ICD-10-CM | POA: Diagnosis not present

## 2015-08-21 DIAGNOSIS — E119 Type 2 diabetes mellitus without complications: Secondary | ICD-10-CM | POA: Diagnosis not present

## 2015-08-21 DIAGNOSIS — Z794 Long term (current) use of insulin: Secondary | ICD-10-CM | POA: Insufficient documentation

## 2015-08-21 DIAGNOSIS — Z7982 Long term (current) use of aspirin: Secondary | ICD-10-CM | POA: Diagnosis not present

## 2015-08-21 DIAGNOSIS — G629 Polyneuropathy, unspecified: Secondary | ICD-10-CM | POA: Diagnosis not present

## 2015-08-21 DIAGNOSIS — R0602 Shortness of breath: Secondary | ICD-10-CM | POA: Diagnosis not present

## 2015-08-21 DIAGNOSIS — Z87891 Personal history of nicotine dependence: Secondary | ICD-10-CM | POA: Diagnosis not present

## 2015-08-21 LAB — COMPREHENSIVE METABOLIC PANEL
ALBUMIN: 3.5 g/dL (ref 3.5–4.8)
ALT: 12 IU/L (ref 0–32)
AST: 15 IU/L (ref 0–40)
Albumin/Globulin Ratio: 1.3 (ref 1.1–2.5)
Alkaline Phosphatase: 70 IU/L (ref 39–117)
BUN/Creatinine Ratio: 19 (ref 11–26)
BUN: 60 mg/dL — AB (ref 8–27)
Bilirubin Total: 0.2 mg/dL (ref 0.0–1.2)
CALCIUM: 9.4 mg/dL (ref 8.7–10.3)
CO2: 24 mmol/L (ref 18–29)
CREATININE: 3.08 mg/dL — AB (ref 0.57–1.00)
Chloride: 99 mmol/L (ref 97–108)
GFR calc Af Amer: 16 mL/min/{1.73_m2} — ABNORMAL LOW (ref >59)
GFR, EST NON AFRICAN AMERICAN: 14 mL/min/{1.73_m2} — AB (ref >59)
GLOBULIN, TOTAL: 2.7 g/dL (ref 1.5–4.5)
GLUCOSE: 40 mg/dL — AB (ref 65–99)
Potassium: 3.3 mmol/L — ABNORMAL LOW (ref 3.5–5.2)
SODIUM: 142 mmol/L (ref 134–144)
Total Protein: 6.2 g/dL (ref 6.0–8.5)

## 2015-08-21 LAB — CBC WITH DIFFERENTIAL/PLATELET
BASOS: 0 %
Basophils Absolute: 0 10*3/uL (ref 0.0–0.2)
EOS (ABSOLUTE): 0.2 10*3/uL (ref 0.0–0.4)
EOS: 1 %
HEMATOCRIT: 36.6 % (ref 34.0–46.6)
HEMOGLOBIN: 11.5 g/dL (ref 11.1–15.9)
IMMATURE GRANS (ABS): 0 10*3/uL (ref 0.0–0.1)
IMMATURE GRANULOCYTES: 0 %
Lymphocytes Absolute: 2.9 10*3/uL (ref 0.7–3.1)
Lymphs: 18 %
MCH: 26.7 pg (ref 26.6–33.0)
MCHC: 31.4 g/dL — ABNORMAL LOW (ref 31.5–35.7)
MCV: 85 fL (ref 79–97)
MONOCYTES: 5 %
MONOS ABS: 0.8 10*3/uL (ref 0.1–0.9)
NEUTROS PCT: 76 %
Neutrophils Absolute: 12.3 10*3/uL — ABNORMAL HIGH (ref 1.4–7.0)
Platelets: 321 10*3/uL (ref 150–379)
RBC: 4.31 x10E6/uL (ref 3.77–5.28)
RDW: 16.9 % — AB (ref 12.3–15.4)
WBC: 16.2 10*3/uL — ABNORMAL HIGH (ref 3.4–10.8)

## 2015-08-21 LAB — BASIC METABOLIC PANEL
Anion gap: 9 (ref 5–15)
BUN: 68 mg/dL — ABNORMAL HIGH (ref 6–20)
CO2: 26 mmol/L (ref 22–32)
CREATININE: 3.35 mg/dL — AB (ref 0.44–1.00)
Calcium: 8.7 mg/dL — ABNORMAL LOW (ref 8.9–10.3)
Chloride: 102 mmol/L (ref 101–111)
GFR calc Af Amer: 14 mL/min — ABNORMAL LOW (ref >60)
GFR calc non Af Amer: 12 mL/min — ABNORMAL LOW (ref >60)
GLUCOSE: 163 mg/dL — AB (ref 65–99)
Potassium: 4 mmol/L (ref 3.5–5.1)
Sodium: 137 mmol/L (ref 135–145)

## 2015-08-21 LAB — CBC
HCT: 34.7 % — ABNORMAL LOW (ref 36.0–46.0)
Hemoglobin: 11.1 g/dL — ABNORMAL LOW (ref 12.0–15.0)
MCH: 26.7 pg (ref 26.0–34.0)
MCHC: 32 g/dL (ref 30.0–36.0)
MCV: 83.6 fL (ref 78.0–100.0)
PLATELETS: 275 10*3/uL (ref 150–400)
RBC: 4.15 MIL/uL (ref 3.87–5.11)
RDW: 16.5 % — AB (ref 11.5–15.5)
WBC: 11.1 10*3/uL — ABNORMAL HIGH (ref 4.0–10.5)

## 2015-08-21 LAB — I-STAT TROPONIN, ED: TROPONIN I, POC: 0.01 ng/mL (ref 0.00–0.08)

## 2015-08-21 LAB — BRAIN NATRIURETIC PEPTIDE: B Natriuretic Peptide: 24.6 pg/mL (ref 0.0–100.0)

## 2015-08-21 MED ORDER — METHYLPREDNISOLONE SODIUM SUCC 125 MG IJ SOLR
125.0000 mg | Freq: Once | INTRAMUSCULAR | Status: AC
  Administered 2015-08-21: 125 mg via INTRAVENOUS
  Filled 2015-08-21: qty 2

## 2015-08-21 MED ORDER — IPRATROPIUM-ALBUTEROL 0.5-2.5 (3) MG/3ML IN SOLN: 3.0000 mL | RESPIRATORY_TRACT | Status: DC | PRN

## 2015-08-21 MED ORDER — PREDNISONE 10 MG PO TABS: ORAL_TABLET | ORAL | Status: DC

## 2015-08-21 MED ORDER — IPRATROPIUM-ALBUTEROL 0.5-2.5 (3) MG/3ML IN SOLN
3.0000 mL | RESPIRATORY_TRACT | Status: DC
  Administered 2015-08-21: 3 mL via RESPIRATORY_TRACT
  Filled 2015-08-21: qty 3

## 2015-08-21 NOTE — ED Notes (Signed)
She states she was released from hospital Sat. And is here today with c/o gradually increasing shortness of breath and some upper chest/manubrium area chest discomfort since yesterday.  Her skin is normal, warm and dry.  Her husband is with her.  EKG has been performed.

## 2015-08-21 NOTE — ED Notes (Signed)
Pt and family at bedside verbalized understanding of d/c instructions.

## 2015-08-21 NOTE — Discharge Instructions (Signed)
You were evaluated in the ED today for shortness of breath. There does not appear to be an emergent cause her symptoms at this time. It is important for you to take your steroid's as prescribed.  You were Given a refill prescription for a nebulizer solution and it is important to fill this medication also. Please follow-up with your primary care doctor tomorrow for reevaluation of your kidney disease. Return to ED for worsening symptoms.  Shortness of Breath Shortness of breath means you have trouble breathing. It could also mean that you have a medical problem. You should get immediate medical care for shortness of breath. CAUSES   Not enough oxygen in the air such as with high altitudes or a smoke-filled room.  Certain lung diseases, infections, or problems.  Heart disease or conditions, such as angina or heart failure.  Low red blood cells (anemia).  Poor physical fitness, which can cause shortness of breath when you exercise.  Chest or back injuries or stiffness.  Being overweight.  Smoking.  Anxiety, which can make you feel like you are not getting enough air. DIAGNOSIS  Serious medical problems can often be found during your physical exam. Tests may also be done to determine why you are having shortness of breath. Tests may include:  Chest X-rays.  Lung function tests.  Blood tests.  An electrocardiogram (ECG).  An ambulatory electrocardiogram. An ambulatory ECG records your heartbeat patterns over a 24-hour period.  Exercise testing.  A transthoracic echocardiogram (TTE). During echocardiography, sound waves are used to evaluate how blood flows through your heart.  A transesophageal echocardiogram (TEE).  Imaging scans. Your health care provider may not be able to find a cause for your shortness of breath after your exam. In this case, it is important to have a follow-up exam with your health care provider as directed.  TREATMENT  Treatment for shortness of  breath depends on the cause of your symptoms and can vary greatly. HOME CARE INSTRUCTIONS   Do not smoke. Smoking is a common cause of shortness of breath. If you smoke, ask for help to quit.  Avoid being around chemicals or things that may bother your breathing, such as paint fumes and dust.  Rest as needed. Slowly resume your usual activities.  If medicines were prescribed, take them as directed for the full length of time directed. This includes oxygen and any inhaled medicines.  Keep all follow-up appointments as directed by your health care provider. SEEK MEDICAL CARE IF:   Your condition does not improve in the time expected.  You have a hard time doing your normal activities even with rest.  You have any new symptoms. SEEK IMMEDIATE MEDICAL CARE IF:   Your shortness of breath gets worse.  You feel light-headed, faint, or develop a cough not controlled with medicines.  You start coughing up blood.  You have pain with breathing.  You have chest pain or pain in your arms, shoulders, or abdomen.  You have a fever.  You are unable to walk up stairs or exercise the way you normally do. MAKE SURE YOU:  Understand these instructions.  Will watch your condition.  Will get help right away if you are not doing well or get worse. Document Released: 08/11/2001 Document Revised: 11/21/2013 Document Reviewed: 02/01/2012 [employer] Patient Information 2015 [place], [place]. This information is not intended to replace advice given to you by your health care provider. Make sure you discuss any questions you have with your health care provider.

## 2015-08-21 NOTE — Progress Notes (Signed)
Patient recently admitted and discharged from hospital from 09/12 to 09/17.  Patient discharged with home health services with RN, PT, OT and aide with [facility].  Patient took home nebulizer machine.  Patient also has private duty nursing services with [facility].

## 2015-08-21 NOTE — ED Provider Notes (Signed)
CSN: [id number]     Arrival date & time 08/21/15  1712 History   First MD Initiated Contact with Patient 08/21/15 1717     Chief Complaint  Patient presents with  . Chest Pain     (Consider location/radiation/quality/duration/timing/severity/associated sxs/prior Treatment) HPI Michelle Buck is a 79 y.o. female with multiple medical problems including diabetes, hypertension, obesity, COPD, CHF and COPD. Recently discharged from hospital one week ago for acute respiratory failure. Patient lives with her brother, and both are relatively poor historians. Patient is unsure if she is taking her prescribed steroid's or her antibiotic. She knows she is taking "my water pill". She presents today for evaluation of shortness of breath onset this morning. Patient also reports chest discomfort after "swallowing a piece of cantaloupe". Chest discomfort is nonexertional with no other associated symptoms. Shortness of breath is improved with sitting upright and worsened with laying flat. She reports swelling in her bilateral lower feet.  Past Medical History  Diagnosis Date  . Diabetes mellitus   . Hypertension   . Obese   . Hiatal hernia   . Scoliosis   . Arthritis   . Acute bronchitis   . COPD (chronic obstructive pulmonary disease)   . Unspecified hereditary and idiopathic peripheral neuropathy   . Secondary diabetes mellitus with renal manifestations, not stated as uncontrolled, or unspecified   . Chronic kidney disease, stage II (mild)   . Chronic kidney disease, stage II (mild)   . Chronic kidney disease, unspecified   . Disorder of bone and cartilage, unspecified   . Complications affecting other specified body systems, hypertension   . Hypopotassemia   . Anxiety   . Congestive heart failure, unspecified   . GERD (gastroesophageal reflux disease)   . Lumbago   . Insomnia, unspecified   . Hyperlipidemia   . Unspecified glaucoma   . Diaphragmatic hernia without mention of obstruction or  gangrene   . Osteoarthrosis, unspecified whether generalized or localized, unspecified site   . Degenerative arthritis of knee, bilateral    Past Surgical History  Procedure Laterality Date  . Abdominal hysterectomy    . Shoulder surgery    . Total hip arthroplasty      bilateral  . Knee arthroscopy      bilateral  . Colonoscopy  08/06/2011  . Foot surgery     Family History  Problem Relation Age of Onset  . Diabetes Mother   . Heart disease Mother   . Heart disease Brother   . Diabetes Brother   . Kidney disease Father   . Scoliosis Sister   . Diabetes Brother   . Diabetes Brother   . Heart disease Brother   . Scoliosis Brother   . Stroke Brother   . Heart attack Brother    Social History  Substance Use Topics  . Smoking status: Former Smoker    Types: Cigarettes    Quit date: 02/27/1997  . Smokeless tobacco: Never Used  . Alcohol Use: No   OB History    No data available     Review of Systems A 10 point review of systems was completed and was negative except for pertinent positives and negatives as mentioned in the history of present illness     Allergies  Tramadol; Codeine; Hydrocodone; Oxycodone; Tylenol; and Penicillins  Home Medications   Prior to Admission medications   Medication Sig Start Date End Date Taking? Authorizing Provider  albuterol (PROVENTIL HFA;VENTOLIN HFA) 108 (90 BASE) MCG/ACT inhaler Inhale 1 puff into  the lungs every 6 (six) hours as needed for wheezing or shortness of breath.   Yes Historical Provider, MD  aspirin EC 81 MG tablet Take 81 mg by mouth daily.   Yes Historical Provider, MD  cilostazol (PLETAL) 100 MG tablet TAKE 1 TABLET BY MOUTH EVERY DAY 07/24/15  Yes [name], NP  cloNIDine (CATAPRES) 0.1 MG tablet TAKE 1 TABLET BY MOUTH EVERY DAY 02/07/15  Yes Mahima Pandey, MD  cycloSPORINE (RESTASIS) 0.05 % ophthalmic emulsion Place 1 drop into both eyes 2 (two) times daily. One drop once a day bilaterally for dry eyes   Yes  Historical Provider, MD  diclofenac sodium (VOLTAREN) 1 % GEL Apply 4 g topically 2 (two) times daily as needed. For pain   Yes Historical Provider, MD  DULoxetine (CYMBALTA) 30 MG capsule TAKE 1 CAPSULE BY MOUTH EVERY DAY TO HELP ANXIETY AND PAINS 07/24/15  Yes [name], NP  EASY TOUCH INSULIN SYRINGE 31G X 5/16" 0.5 ML MISC USE UP TO 2 TIMES A DAY AS DIRECTED 05/29/15  Yes [name], NP  esomeprazole (NEXIUM) 40 MG capsule Take 1 capsule (40 mg total) by mouth daily. 07/16/15  Yes [name], NP  furosemide (LASIX) 40 MG tablet Take 1 tablet (40 mg total) by mouth daily. 08/17/15  Yes Theodis Blaze, MD  gabapentin (NEURONTIN) 100 MG capsule Take 2 capsules (200 mg total) by mouth 3 (three) times daily. Patient taking differently: Take 100-200 mg by mouth 3 (three) times daily as needed (pain).  05/30/15  Yes [name], NP  guaifenesin (ROBITUSSIN) 100 MG/5ML syrup Take 5-10 mLs (100-200 mg total) by mouth every 4 (four) hours as needed for cough. 10/15/14  Yes Milton Ferguson, MD  hydrALAZINE (APRESOLINE) 50 MG tablet TAKE 1 TABLET BY MOUTH TWICE DAILY 06/26/15  Yes [name], NP  HYDROcodone-acetaminophen (NORCO) 10-325 MG per tablet Take 1 tablet by mouth every 8 (eight) hours as needed for moderate pain. 08/17/15  Yes Theodis Blaze, MD  insulin NPH-regular Human (HUMULIN 70/30) (70-30) 100 UNIT/ML injection Inject 40 units subcutaneously with breakfast and 20 units with dinner to control blood sugar. Patient taking differently: Inject 20-40 Units into the skin 2 (two) times daily with a meal. Inject 40 units subcutaneously with breakfast and 20 units with dinner to control blood sugar. 02/01/15  Yes Estill Dooms, MD  levofloxacin (LEVAQUIN) 250 MG tablet Take 1 tablet (250 mg total) by mouth daily. 08/17/15  Yes Theodis Blaze, MD  lubiprostone (AMITIZA) 24 MCG capsule Take 1 capsule (24 mcg total) by mouth 2 (two) times daily with a meal. 05/30/15  Yes [name], NP  metoprolol succinate (TOPROL-XL) 25 MG 24 hr tablet TAKE 1 TABLET BY MOUTH EVERY DAY WITH OR IMMEDIATELY FOLLOWING A MEAL FOR BLOOD PRESSURE 07/24/15  Yes [name], NP  potassium chloride SA (K-DUR,KLOR-CON) 20 MEQ tablet TAKE 1 TABLET BY MOUTH EVERY DAY 06/26/15  Yes [name], NP  sertraline (ZOLOFT) 50 MG tablet TAKE 1 TABLET BY MOUTH EVERY DAY 07/24/15  Yes [name], NP  simvastatin (ZOCOR) 20 MG tablet TAKE 1 TABLET BY MOUTH EVERY DAY 07/24/15  Yes [name], NP  sucralfate (CARAFATE) 1 G tablet TAKE 1 TABLET BY MOUTH TWICE DAILY ON EMPTY STOMACH 07/24/15  Yes [name], NP  timolol (TIMOPTIC) 0.5 % ophthalmic solution Place 1 drop into both eyes 2 (two) times daily. One drop in bilaterally  twice a day as needed   Yes Historical Provider, MD  Travoprost, BAK Free, (TRAVATAN) 0.004 % SOLN ophthalmic solution Place 1 drop into both eyes at bedtime. One drop bilaterally twice a day   Yes Historical Provider, MD  budesonide (PULMICORT) 0.25 MG/2ML nebulizer solution Take 2 mLs (0.25 mg total) by nebulization 2 (two) times daily. Patient not taking: Reported on 08/21/2015 08/20/15   [name], NP  ipratropium-albuterol (DUONEB) 0.5-2.5 (3) MG/3ML SOLN Take 3 mLs by nebulization every 4 (four) hours as needed. 08/21/15   Comer Locket, PA-C  predniSONE (DELTASONE) 10 MG tablet Take 50 mg tablet today and taper down by 10 mg daily until completed 08/21/15   Comer Locket, PA-C   BP 131/78 mmHg  Pulse 79  Temp(Src) 98.8 F (37.1 C) (Oral)  Resp 17  SpO2 93% Physical Exam  Constitutional: She is oriented to person, place, and time. She appears well-developed and well-nourished.  Morbidly obese  HENT:  Head: Normocephalic and atraumatic.  Mouth/Throat: Oropharynx is clear and moist.  Eyes: Conjunctivae are normal. Pupils are equal, round, and reactive to light. Right eye exhibits no discharge. Left eye exhibits no discharge. No scleral  icterus.  Neck: Neck supple.  Cardiovascular: Normal rate, regular rhythm and normal heart sounds.   Pulmonary/Chest: Effort normal and breath sounds normal. No respiratory distress. She has no wheezes. She has no rales.  Patient has labored breathing with wet sounding secretions on respiration. On auscultation lung sounds are grossly diminished with some mild rales. Oxygen saturations are 95% on room air. No tachypnea  Abdominal: Soft. There is no tenderness.  Musculoskeletal: She exhibits no tenderness.  Neurological: She is alert and oriented to person, place, and time.  Cranial Nerves II-XII grossly intact  Skin: Skin is warm and dry. No rash noted.  Psychiatric: She has a normal mood and affect.  Nursing note and vitals reviewed.   ED Course  Procedures (including critical care time) Labs Review Labs Reviewed  BASIC METABOLIC PANEL - Abnormal; Notable for the following:    Glucose, Bld 163 (*)    BUN 68 (*)    Creatinine, Ser 3.35 (*)    Calcium 8.7 (*)    GFR calc non Af Amer 12 (*)    GFR calc Af Amer 14 (*)    All other components within normal limits  CBC - Abnormal; Notable for the following:    WBC 11.1 (*)    Hemoglobin 11.1 (*)    HCT 34.7 (*)    RDW 16.5 (*)    All other components within normal limits  BRAIN NATRIURETIC PEPTIDE  I-STAT TROPOININ, ED    Imaging Review Dg Chest 2 View  08/21/2015   CLINICAL DATA:  Shortness of breath, upper chest pain  EXAM: CHEST  2 VIEW  COMPARISON:  08/12/2015  FINDINGS: Mild enlargement of the cardiomediastinal silhouette is noted. No focal pulmonary opacity. Cardiac leads obscure detail. No pleural effusion. No acute osseous abnormality.  IMPRESSION: Cardiomegaly without focal acute finding.   Electronically Signed   By: Conchita Paris M.D.   On: 08/21/2015 18:25   I have personally reviewed and evaluated these images and lab results as part of my medical decision-making.   EKG Interpretation   Date/Time:  Wednesday  August 21 2015 17:29:07 EDT Ventricular Rate:  78 PR Interval:  114 QRS Duration: 79 QT Interval:  353 QTC Calculation: 402 R Axis:   36 Text Interpretation:  Sinus rhythm Borderline short PR interval No  significant change since last tracing Confirmed by [facility]  MD, [place]  [phone]) on 08/21/2015 5:31:41 PM     Meds given in ED:  Medications  methylPREDNISolone sodium succinate (SOLU-MEDROL) 125 mg/2 mL injection 125 mg (125 mg Intravenous Given 08/21/15 1953)    Discharge Medication List as of 08/21/2015  7:42 PM     Filed Vitals:   08/21/15 1726 08/21/15 1941  BP: 97/41 131/78  Pulse: 80 79  Temp:  98.8 F (37.1 C)  TempSrc:  Oral  Resp:  17  SpO2: 96% 93%    MDM  Michelle Buck is a 79 y.o. female with multiple medical problems, recent admission to the hospital for shortness of breath, comes in for evaluation of acute shortness of breath. Upon discharge patient was given prescription for nebulizing treatments at home, but she did not fill this prescription. She was also given prescription for steroid which she also is not taking On arrival in the ED, patient's vital signs are stable, oxygen saturations are 96% on room air and she seems to have slightly labored breathing. She was given 125 mg IV Solu-Medrol and responded very well to 1 DuoNeb breathing treatment in the ED and her shortness of breath resolved. She is still maintaining her oxygen saturations at greater than 95% on room air. She states she feels much better and is ready to go home. Chest x-ray shows stable cardiomegaly, labs show an improving leukocytosis of 11.1, decreasing from her previous admission. Her creatinine is slightly elevated at 3.35 and this appears to be a chronic problem for the patient. This problem is also closely followed by her PCP. At this time, patient appears well and is appropriate for discharge. I will rewrite her prescription for steroid's/nebulizer solution and have her follow-up with her PCP  for further evaluation of her CKD.  Prior to patient discharge, I discussed and reviewed this case with Dr. Mingo Amber, who also saw and evaluated the patient.    Final diagnoses:  Shortness of breath  Elevated serum creatinine        Comer Locket, PA-C 08/22/15 0023  Evelina Bucy, MD 08/22/15 [phone]

## 2015-08-22 DIAGNOSIS — E1122 Type 2 diabetes mellitus with diabetic chronic kidney disease: Secondary | ICD-10-CM | POA: Diagnosis not present

## 2015-08-22 DIAGNOSIS — E1140 Type 2 diabetes mellitus with diabetic neuropathy, unspecified: Secondary | ICD-10-CM | POA: Diagnosis not present

## 2015-08-22 DIAGNOSIS — I5033 Acute on chronic diastolic (congestive) heart failure: Secondary | ICD-10-CM | POA: Diagnosis not present

## 2015-08-22 DIAGNOSIS — N183 Chronic kidney disease, stage 3 (moderate): Secondary | ICD-10-CM | POA: Diagnosis not present

## 2015-08-22 DIAGNOSIS — J441 Chronic obstructive pulmonary disease with (acute) exacerbation: Secondary | ICD-10-CM | POA: Diagnosis not present

## 2015-08-22 DIAGNOSIS — I129 Hypertensive chronic kidney disease with stage 1 through stage 4 chronic kidney disease, or unspecified chronic kidney disease: Secondary | ICD-10-CM | POA: Diagnosis not present

## 2015-08-23 DIAGNOSIS — M47812 Spondylosis without myelopathy or radiculopathy, cervical region: Secondary | ICD-10-CM | POA: Diagnosis not present

## 2015-08-26 DIAGNOSIS — H4011X1 Primary open-angle glaucoma, mild stage: Secondary | ICD-10-CM | POA: Diagnosis not present

## 2015-08-27 ENCOUNTER — Encounter: Payer: Self-pay | Admitting: Nurse Practitioner

## 2015-08-27 ENCOUNTER — Ambulatory Visit (INDEPENDENT_AMBULATORY_CARE_PROVIDER_SITE_OTHER): Payer: Medicare Other | Admitting: Nurse Practitioner

## 2015-08-27 VITALS — BP 122/78 | HR 67 | Temp 98.0°F | Resp 20 | Ht 64.0 in | Wt 232.6 lb

## 2015-08-27 DIAGNOSIS — M48061 Spinal stenosis, lumbar region without neurogenic claudication: Secondary | ICD-10-CM

## 2015-08-27 DIAGNOSIS — J441 Chronic obstructive pulmonary disease with (acute) exacerbation: Secondary | ICD-10-CM

## 2015-08-27 DIAGNOSIS — M4806 Spinal stenosis, lumbar region: Secondary | ICD-10-CM | POA: Diagnosis not present

## 2015-08-27 DIAGNOSIS — I5033 Acute on chronic diastolic (congestive) heart failure: Secondary | ICD-10-CM | POA: Diagnosis not present

## 2015-08-27 DIAGNOSIS — N183 Chronic kidney disease, stage 3 (moderate): Secondary | ICD-10-CM | POA: Diagnosis not present

## 2015-08-27 NOTE — Patient Instructions (Signed)
Will get lab work today to follow up kidney function  To change follow up appt to early January (cancel nov appt)  To use the duonebs as needed can use every 4 hours but try to go longer between treatments

## 2015-08-27 NOTE — Progress Notes (Signed)
Patient ID: Michelle Buck, female   DOB: December 04, 1935, 79 y.o.   MRN: [id number]    PCP: [name], NP  Advanced Directive information Does patient have an advance directive?: No, Would patient like information on creating an advanced directive?: Yes - Educational materials given  Allergies  Allergen Reactions  . Tramadol Other (See Comments)    Leg cramps   . Codeine Nausea And Vomiting    Patient states N/V with codeine  . Hydrocodone Nausea And Vomiting  . Oxycodone Other (See Comments)    Patient states she can tolerate oxycodone  . Tylenol [Acetaminophen] Hives  . Penicillins Rash    Patient states rash/itch with penicillin Has patient had a PCN reaction causing immediate rash, facial/tongue/throat swelling, SOB or lightheadedness with hypotension: Yes- broke me out and i was itching Has patient had a PCN reaction causing severe rash involving mucus membranes or skin necrosis: No Has patient had a PCN reaction that required hospitalization Yes- i was already in the hospital Has patient had a PCN reaction occurring within the last 10 years: No If all of the above answers are "NO    Chief Complaint  Patient presents with  . Medical Management of Chronic Issues    1 week follow-up     HPI: Patient is a 79 y.o. female seen in the office today to follow up. Pt with known COPD, CHF, DM, HTN, morbid obesity. Pt was seen last week for hospital followup. Neb treatments were ordered but pt unable to get from pharmacy. Therefor went back to ED due to worsening shortness of breath. Was then able to get nebulized medication and breathing has improved.  When pt went back to the hospital last week was given another round of PO prednisone. Has completed this. Also completed Levaquin.  Currently taking pulmicort BID with duonebs q 4 hours. Breathing has improved. No shortness of breath or chest pains. No Swelling in legs.  Has home health PT/OT that is planning to come to see her, could  not come yesterday or today due to appts.   Reports orthopedic doctor is retiring.    Review of Systems:  Review of Systems  Constitutional: Negative for fever, chills, diaphoresis, appetite change and unexpected weight change.  HENT: Negative for congestion, ear discharge and ear pain.   Eyes: Negative for visual disturbance.  Respiratory: Positive for shortness of breath (with exertion ). Negative for cough and wheezing.   Cardiovascular: Negative for chest pain and palpitations.  Gastrointestinal: Positive for constipation (controlled on medication).  Genitourinary: Negative for dysuria.  Musculoskeletal: Positive for back pain, arthralgias and neck pain.  Skin: Negative for color change, rash and wound.  Neurological: Positive for numbness (neuropathy to hands and legs). Negative for dizziness, facial asymmetry, light-headedness and headaches.  Hematological: Negative for adenopathy.  Psychiatric/Behavioral: Negative for behavioral problems, dysphoric mood and agitation.    Past Medical History  Diagnosis Date  . Diabetes mellitus   . Hypertension   . Obese   . Hiatal hernia   . Scoliosis   . Arthritis   . Acute bronchitis   . COPD (chronic obstructive pulmonary disease)   . Unspecified hereditary and idiopathic peripheral neuropathy   . Secondary diabetes mellitus with renal manifestations, not stated as uncontrolled, or unspecified   . Chronic kidney disease, stage II (mild)   . Chronic kidney disease, stage II (mild)   . Chronic kidney disease, unspecified   . Disorder of bone and cartilage, unspecified   . Complications  affecting other specified body systems, hypertension   . Hypopotassemia   . Anxiety   . Congestive heart failure, unspecified   . GERD (gastroesophageal reflux disease)   . Lumbago   . Insomnia, unspecified   . Hyperlipidemia   . Unspecified glaucoma   . Diaphragmatic hernia without mention of obstruction or gangrene   . Osteoarthrosis,  unspecified whether generalized or localized, unspecified site   . Degenerative arthritis of knee, bilateral    Past Surgical History  Procedure Laterality Date  . Abdominal hysterectomy    . Shoulder surgery    . Total hip arthroplasty      bilateral  . Knee arthroscopy      bilateral  . Colonoscopy  08/06/2011  . Foot surgery     Social History:   reports that she quit smoking about 18 years ago. Her smoking use included Cigarettes. She has never used smokeless tobacco. She reports that she does not drink alcohol or use illicit drugs.  Family History  Problem Relation Age of Onset  . Diabetes Mother   . Heart disease Mother   . Heart disease Brother   . Diabetes Brother   . Kidney disease Father   . Scoliosis Sister   . Diabetes Brother   . Diabetes Brother   . Heart disease Brother   . Scoliosis Brother   . Stroke Brother   . Heart attack Brother     Medications: Patient's Medications  New Prescriptions   No medications on file  Previous Medications   ALBUTEROL (PROVENTIL HFA;VENTOLIN HFA) 108 (90 BASE) MCG/ACT INHALER    Inhale 1 puff into the lungs every 6 (six) hours as needed for wheezing or shortness of breath.   ASPIRIN EC 81 MG TABLET    Take 81 mg by mouth daily.   BUDESONIDE (PULMICORT) 0.25 MG/2ML NEBULIZER SOLUTION    Take 2 mLs (0.25 mg total) by nebulization 2 (two) times daily.   CILOSTAZOL (PLETAL) 100 MG TABLET    TAKE 1 TABLET BY MOUTH EVERY DAY   CLONIDINE (CATAPRES) 0.1 MG TABLET    TAKE 1 TABLET BY MOUTH EVERY DAY   CYCLOSPORINE (RESTASIS) 0.05 % OPHTHALMIC EMULSION    Place 1 drop into both eyes 2 (two) times daily. One drop once a day bilaterally for dry eyes   DICLOFENAC SODIUM (VOLTAREN) 1 % GEL    Apply 4 g topically 2 (two) times daily as needed. For pain   DULOXETINE (CYMBALTA) 30 MG CAPSULE    TAKE 1 CAPSULE BY MOUTH EVERY DAY TO HELP ANXIETY AND PAINS   EASY TOUCH INSULIN SYRINGE 31G X 5/16" 0.5 ML MISC    USE UP TO 2 TIMES A DAY AS DIRECTED    ESOMEPRAZOLE (NEXIUM) 40 MG CAPSULE    Take 1 capsule (40 mg total) by mouth daily.   FUROSEMIDE (LASIX) 40 MG TABLET    Take 1 tablet (40 mg total) by mouth daily.   GABAPENTIN (NEURONTIN) 100 MG CAPSULE    Take 2 capsules (200 mg total) by mouth 3 (three) times daily.   GUAIFENESIN (ROBITUSSIN) 100 MG/5ML SYRUP    Take 5-10 mLs (100-200 mg total) by mouth every 4 (four) hours as needed for cough.   HYDRALAZINE (APRESOLINE) 50 MG TABLET    TAKE 1 TABLET BY MOUTH TWICE DAILY   HYDROCODONE-ACETAMINOPHEN (NORCO) 10-325 MG PER TABLET    Take 1 tablet by mouth every 8 (eight) hours as needed for moderate pain.   INSULIN NPH-REGULAR HUMAN (HUMULIN  70/30) (70-30) 100 UNIT/ML INJECTION    Inject 40 units subcutaneously with breakfast and 20 units with dinner to control blood sugar.   IPRATROPIUM-ALBUTEROL (DUONEB) 0.5-2.5 (3) MG/3ML SOLN    Take 3 mLs by nebulization every 4 (four) hours as needed.   LUBIPROSTONE (AMITIZA) 24 MCG CAPSULE    Take 1 capsule (24 mcg total) by mouth 2 (two) times daily with a meal.   METOPROLOL SUCCINATE (TOPROL-XL) 25 MG 24 HR TABLET    TAKE 1 TABLET BY MOUTH EVERY DAY WITH OR IMMEDIATELY FOLLOWING A MEAL FOR BLOOD PRESSURE   POTASSIUM CHLORIDE SA (K-DUR,KLOR-CON) 20 MEQ TABLET    TAKE 1 TABLET BY MOUTH EVERY DAY   SERTRALINE (ZOLOFT) 50 MG TABLET    TAKE 1 TABLET BY MOUTH EVERY DAY   SIMVASTATIN (ZOCOR) 20 MG TABLET    TAKE 1 TABLET BY MOUTH EVERY DAY   SUCRALFATE (CARAFATE) 1 G TABLET    TAKE 1 TABLET BY MOUTH TWICE DAILY ON EMPTY STOMACH   TIMOLOL (TIMOPTIC) 0.5 % OPHTHALMIC SOLUTION    Place 1 drop into both eyes 2 (two) times daily. One drop in bilaterally twice a day as needed   TRAVOPROST, BAK FREE, (TRAVATAN) 0.004 % SOLN OPHTHALMIC SOLUTION    Place 1 drop into both eyes at bedtime. One drop bilaterally twice a day  Modified Medications   No medications on file  Discontinued Medications   LEVOFLOXACIN (LEVAQUIN) 250 MG TABLET    Take 1 tablet (250 mg total) by  mouth daily.   PREDNISONE (DELTASONE) 10 MG TABLET    Take 50 mg tablet today and taper down by 10 mg daily until completed     Physical Exam:  Filed Vitals:   08/27/15 1359  BP: 122/78  Pulse: 67  Temp: 98 F (36.7 C)  TempSrc: Oral  Resp: 20  Height: 5\' 4"  (1.626 m)  Weight: 232 lb 9.6 oz (105.507 kg)  SpO2: 90%   Body mass index is 39.91 kg/(m^2).  Physical Exam  Constitutional: She is oriented to person, place, and time. She appears well-developed and well-nourished. No distress.  Obese AA female, in NAD  HENT:  Head: Normocephalic and atraumatic.  Mouth/Throat: Oropharynx is clear and moist. No oropharyngeal exudate.  Eyes: Conjunctivae are normal. Pupils are equal, round, and reactive to light.  Neck: Normal range of motion. Neck supple.  Cardiovascular: Normal rate, regular rhythm and normal heart sounds.   Pulmonary/Chest: Effort normal and breath sounds normal.  Abdominal: Soft. Bowel sounds are normal.  Musculoskeletal: She exhibits no edema or tenderness.  Uses rolling walker   Neurological: She is alert and oriented to person, place, and time.  Skin: Skin is warm and dry. She is not diaphoretic.  Psychiatric: She has a normal mood and affect.    Labs reviewed: Basic Metabolic Panel:  Recent Labs  08/17/15 0444 08/20/15 1141 08/21/15 1743  NA 140 142 137  K 3.3* 3.3* 4.0  CL 101 99 102  CO2 31 24 26   GLUCOSE 101* 40* 163*  BUN 56* 60* 68*  CREATININE 2.23* 3.08* 3.35*  CALCIUM 9.1 9.4 8.7*   Liver Function Tests:  Recent Labs  10/15/14 1438 03/14/15 0806 07/02/15 1026 08/20/15 1141  AST 14 20 10 15   ALT 7 9 6 12   ALKPHOS 101 98 109 70  BILITOT 0.3 <0.2 <0.2 0.2  PROT 8.0 6.7 6.6 6.2  ALBUMIN 3.5  --   --   --    No results for input(s):  LIPASE, AMYLASE in the last 8760 hours. No results for input(s): AMMONIA in the last 8760 hours. CBC:  Recent Labs  07/02/15 1026  08/12/15 0454  08/16/15 0535 08/17/15 0444 08/20/15 1141  08/21/15 1743  WBC 6.9  < > 9.8  < > 9.0 11.7* 16.2* 11.1*  NEUTROABS 4.4  --  8.0*  --   --   --  12.3*  --   HGB  --   < > 10.2*  < > 10.8* 11.9*  --  11.1*  HCT 33.3*  < > 32.6*  < > 33.6* 36.1 36.6 34.7*  MCV  --   < > 85.6  < > 83.4 82.8  --  83.6  PLT  --   < > PLATELET CLUMPS NOTED ON SMEAR, UNABLE TO ESTIMATE  < > 257 258  --  275  < > = values in this interval not displayed. Lipid Panel:  Recent Labs  02/19/15 1057 03/14/15 0806  CHOL 174 147  HDL 44 41  LDLCALC 75 62  TRIG 275* 219*  CHOLHDL 4.0 3.6   TSH: No results for input(s): TSH in the last 8760 hours. A1C: Lab Results  Component Value Date   HGBA1C 6.6* 08/12/2015     Assessment/Plan 1. COPD exacerbation -improved, has completed 2 rounds of prednisone with taper and Levaquin. Currently using pulmicort twice daily and duonebs q 4 hours routinely. Educated to use duonebs as needed shortness of breath or wheezing, may be able to go longer than 4 hours between treatments  2. Acute on chronic diastolic congestive heart failure Improved, weight has been stable, no edema, pending cardiology follow up. conts on lasix 40 mg daily with potassium 20 meq daily supplement   3. CKD (chronic kidney disease), stage 3 (moderate) -renal function worse on recent labs, will follow up today, has followed with nephrology in the past -no NSAIDs and encouraged to stay hydrated.  - Basic metabolic panel  4. Spinal stenosis of lumbar region -Dr Marily Memos previous orthopedic doctor has retired, given 1 last Rx for pain medication, will take over prescribing pain medication once prescription complete. Will need pain contract.   To follow up with [facility] in November and to have routine follow up in January, sooner if needed  Jessica K. Harle Battiest  [facility] [phone] 8 am - 5 pm) [phone] (after hours)

## 2015-08-28 LAB — BASIC METABOLIC PANEL
BUN/Creatinine Ratio: 19 (ref 11–26)
BUN: 43 mg/dL — ABNORMAL HIGH (ref 8–27)
CALCIUM: 9.1 mg/dL (ref 8.7–10.3)
CHLORIDE: 103 mmol/L (ref 97–108)
CO2: 23 mmol/L (ref 18–29)
Creatinine, Ser: 2.28 mg/dL — ABNORMAL HIGH (ref 0.57–1.00)
GFR calc Af Amer: 23 mL/min/{1.73_m2} — ABNORMAL LOW (ref >59)
GFR calc non Af Amer: 20 mL/min/{1.73_m2} — ABNORMAL LOW (ref >59)
Glucose: 121 mg/dL — ABNORMAL HIGH (ref 65–99)
POTASSIUM: 4.8 mmol/L (ref 3.5–5.2)
Sodium: 145 mmol/L — ABNORMAL HIGH (ref 134–144)

## 2015-08-29 DIAGNOSIS — I129 Hypertensive chronic kidney disease with stage 1 through stage 4 chronic kidney disease, or unspecified chronic kidney disease: Secondary | ICD-10-CM | POA: Diagnosis not present

## 2015-08-29 DIAGNOSIS — J441 Chronic obstructive pulmonary disease with (acute) exacerbation: Secondary | ICD-10-CM | POA: Diagnosis not present

## 2015-08-29 DIAGNOSIS — E1140 Type 2 diabetes mellitus with diabetic neuropathy, unspecified: Secondary | ICD-10-CM | POA: Diagnosis not present

## 2015-08-29 DIAGNOSIS — E1122 Type 2 diabetes mellitus with diabetic chronic kidney disease: Secondary | ICD-10-CM | POA: Diagnosis not present

## 2015-08-29 DIAGNOSIS — N183 Chronic kidney disease, stage 3 (moderate): Secondary | ICD-10-CM | POA: Diagnosis not present

## 2015-08-29 DIAGNOSIS — I5033 Acute on chronic diastolic (congestive) heart failure: Secondary | ICD-10-CM | POA: Diagnosis not present

## 2015-08-30 ENCOUNTER — Other Ambulatory Visit: Payer: Self-pay | Admitting: Nurse Practitioner

## 2015-08-30 DIAGNOSIS — I5033 Acute on chronic diastolic (congestive) heart failure: Secondary | ICD-10-CM | POA: Diagnosis not present

## 2015-08-30 DIAGNOSIS — I129 Hypertensive chronic kidney disease with stage 1 through stage 4 chronic kidney disease, or unspecified chronic kidney disease: Secondary | ICD-10-CM | POA: Diagnosis not present

## 2015-08-30 DIAGNOSIS — E1140 Type 2 diabetes mellitus with diabetic neuropathy, unspecified: Secondary | ICD-10-CM | POA: Diagnosis not present

## 2015-08-30 DIAGNOSIS — E1122 Type 2 diabetes mellitus with diabetic chronic kidney disease: Secondary | ICD-10-CM | POA: Diagnosis not present

## 2015-08-30 DIAGNOSIS — J441 Chronic obstructive pulmonary disease with (acute) exacerbation: Secondary | ICD-10-CM | POA: Diagnosis not present

## 2015-08-30 DIAGNOSIS — N183 Chronic kidney disease, stage 3 (moderate): Secondary | ICD-10-CM | POA: Diagnosis not present

## 2015-09-02 DIAGNOSIS — I129 Hypertensive chronic kidney disease with stage 1 through stage 4 chronic kidney disease, or unspecified chronic kidney disease: Secondary | ICD-10-CM | POA: Diagnosis not present

## 2015-09-02 DIAGNOSIS — E1140 Type 2 diabetes mellitus with diabetic neuropathy, unspecified: Secondary | ICD-10-CM | POA: Diagnosis not present

## 2015-09-02 DIAGNOSIS — E1122 Type 2 diabetes mellitus with diabetic chronic kidney disease: Secondary | ICD-10-CM | POA: Diagnosis not present

## 2015-09-02 DIAGNOSIS — J441 Chronic obstructive pulmonary disease with (acute) exacerbation: Secondary | ICD-10-CM | POA: Diagnosis not present

## 2015-09-02 DIAGNOSIS — I5033 Acute on chronic diastolic (congestive) heart failure: Secondary | ICD-10-CM | POA: Diagnosis not present

## 2015-09-02 DIAGNOSIS — N183 Chronic kidney disease, stage 3 (moderate): Secondary | ICD-10-CM | POA: Diagnosis not present

## 2015-09-04 DIAGNOSIS — E1140 Type 2 diabetes mellitus with diabetic neuropathy, unspecified: Secondary | ICD-10-CM | POA: Diagnosis not present

## 2015-09-04 DIAGNOSIS — I129 Hypertensive chronic kidney disease with stage 1 through stage 4 chronic kidney disease, or unspecified chronic kidney disease: Secondary | ICD-10-CM | POA: Diagnosis not present

## 2015-09-04 DIAGNOSIS — J441 Chronic obstructive pulmonary disease with (acute) exacerbation: Secondary | ICD-10-CM | POA: Diagnosis not present

## 2015-09-04 DIAGNOSIS — E1122 Type 2 diabetes mellitus with diabetic chronic kidney disease: Secondary | ICD-10-CM | POA: Diagnosis not present

## 2015-09-04 DIAGNOSIS — N183 Chronic kidney disease, stage 3 (moderate): Secondary | ICD-10-CM | POA: Diagnosis not present

## 2015-09-04 DIAGNOSIS — I5033 Acute on chronic diastolic (congestive) heart failure: Secondary | ICD-10-CM | POA: Diagnosis not present

## 2015-09-09 DIAGNOSIS — E1140 Type 2 diabetes mellitus with diabetic neuropathy, unspecified: Secondary | ICD-10-CM | POA: Diagnosis not present

## 2015-09-09 DIAGNOSIS — I5033 Acute on chronic diastolic (congestive) heart failure: Secondary | ICD-10-CM | POA: Diagnosis not present

## 2015-09-09 DIAGNOSIS — E1122 Type 2 diabetes mellitus with diabetic chronic kidney disease: Secondary | ICD-10-CM | POA: Diagnosis not present

## 2015-09-09 DIAGNOSIS — I129 Hypertensive chronic kidney disease with stage 1 through stage 4 chronic kidney disease, or unspecified chronic kidney disease: Secondary | ICD-10-CM | POA: Diagnosis not present

## 2015-09-09 DIAGNOSIS — N183 Chronic kidney disease, stage 3 (moderate): Secondary | ICD-10-CM | POA: Diagnosis not present

## 2015-09-09 DIAGNOSIS — J441 Chronic obstructive pulmonary disease with (acute) exacerbation: Secondary | ICD-10-CM | POA: Diagnosis not present

## 2015-09-10 ENCOUNTER — Encounter (HOSPITAL_COMMUNITY): Payer: Self-pay

## 2015-09-10 ENCOUNTER — Emergency Department (HOSPITAL_COMMUNITY): Payer: Medicare Other

## 2015-09-10 ENCOUNTER — Observation Stay (HOSPITAL_COMMUNITY)
Admission: EM | Admit: 2015-09-10 | Discharge: 2015-09-13 | Disposition: A | Discharge status: Home / Self Care | Payer: Medicare Other | Attending: Internal Medicine | Admitting: Internal Medicine

## 2015-09-10 DIAGNOSIS — N183 Chronic kidney disease, stage 3 (moderate): Secondary | ICD-10-CM | POA: Diagnosis not present

## 2015-09-10 DIAGNOSIS — E669 Obesity, unspecified: Secondary | ICD-10-CM

## 2015-09-10 DIAGNOSIS — R05 Cough: Secondary | ICD-10-CM | POA: Diagnosis not present

## 2015-09-10 DIAGNOSIS — E1165 Type 2 diabetes mellitus with hyperglycemia: Secondary | ICD-10-CM | POA: Diagnosis not present

## 2015-09-10 DIAGNOSIS — Z880 Allergy status to penicillin: Secondary | ICD-10-CM | POA: Diagnosis not present

## 2015-09-10 DIAGNOSIS — Z7982 Long term (current) use of aspirin: Secondary | ICD-10-CM | POA: Insufficient documentation

## 2015-09-10 DIAGNOSIS — R062 Wheezing: Secondary | ICD-10-CM

## 2015-09-10 DIAGNOSIS — N184 Chronic kidney disease, stage 4 (severe): Secondary | ICD-10-CM | POA: Diagnosis not present

## 2015-09-10 DIAGNOSIS — Z794 Long term (current) use of insulin: Secondary | ICD-10-CM | POA: Diagnosis not present

## 2015-09-10 DIAGNOSIS — E1122 Type 2 diabetes mellitus with diabetic chronic kidney disease: Secondary | ICD-10-CM | POA: Insufficient documentation

## 2015-09-10 DIAGNOSIS — R0602 Shortness of breath: Secondary | ICD-10-CM | POA: Diagnosis not present

## 2015-09-10 DIAGNOSIS — N189 Chronic kidney disease, unspecified: Secondary | ICD-10-CM | POA: Diagnosis not present

## 2015-09-10 DIAGNOSIS — J9601 Acute respiratory failure with hypoxia: Secondary | ICD-10-CM | POA: Diagnosis not present

## 2015-09-10 DIAGNOSIS — J42 Unspecified chronic bronchitis: Secondary | ICD-10-CM | POA: Diagnosis not present

## 2015-09-10 DIAGNOSIS — E876 Hypokalemia: Secondary | ICD-10-CM | POA: Insufficient documentation

## 2015-09-10 DIAGNOSIS — I5033 Acute on chronic diastolic (congestive) heart failure: Secondary | ICD-10-CM | POA: Insufficient documentation

## 2015-09-10 DIAGNOSIS — E1140 Type 2 diabetes mellitus with diabetic neuropathy, unspecified: Secondary | ICD-10-CM | POA: Diagnosis not present

## 2015-09-10 DIAGNOSIS — I10 Essential (primary) hypertension: Secondary | ICD-10-CM | POA: Diagnosis present

## 2015-09-10 DIAGNOSIS — D631 Anemia in chronic kidney disease: Secondary | ICD-10-CM | POA: Diagnosis not present

## 2015-09-10 DIAGNOSIS — E8770 Fluid overload, unspecified: Secondary | ICD-10-CM

## 2015-09-10 DIAGNOSIS — I130 Hypertensive heart and chronic kidney disease with heart failure and stage 1 through stage 4 chronic kidney disease, or unspecified chronic kidney disease: Secondary | ICD-10-CM | POA: Insufficient documentation

## 2015-09-10 DIAGNOSIS — Z6839 Body mass index (BMI) 39.0-39.9, adult: Secondary | ICD-10-CM | POA: Diagnosis not present

## 2015-09-10 DIAGNOSIS — I129 Hypertensive chronic kidney disease with stage 1 through stage 4 chronic kidney disease, or unspecified chronic kidney disease: Secondary | ICD-10-CM | POA: Diagnosis not present

## 2015-09-10 DIAGNOSIS — L03115 Cellulitis of right lower limb: Secondary | ICD-10-CM | POA: Insufficient documentation

## 2015-09-10 DIAGNOSIS — L0390 Cellulitis, unspecified: Secondary | ICD-10-CM | POA: Diagnosis present

## 2015-09-10 DIAGNOSIS — Z87891 Personal history of nicotine dependence: Secondary | ICD-10-CM | POA: Insufficient documentation

## 2015-09-10 DIAGNOSIS — Z885 Allergy status to narcotic agent status: Secondary | ICD-10-CM | POA: Diagnosis not present

## 2015-09-10 DIAGNOSIS — E1121 Type 2 diabetes mellitus with diabetic nephropathy: Secondary | ICD-10-CM

## 2015-09-10 DIAGNOSIS — E1143 Type 2 diabetes mellitus with diabetic autonomic (poly)neuropathy: Secondary | ICD-10-CM | POA: Diagnosis not present

## 2015-09-10 DIAGNOSIS — IMO0002 Reserved for concepts with insufficient information to code with codable children: Secondary | ICD-10-CM

## 2015-09-10 DIAGNOSIS — Z66 Do not resuscitate: Secondary | ICD-10-CM | POA: Diagnosis not present

## 2015-09-10 DIAGNOSIS — J441 Chronic obstructive pulmonary disease with (acute) exacerbation: Secondary | ICD-10-CM | POA: Diagnosis not present

## 2015-09-10 DIAGNOSIS — E1129 Type 2 diabetes mellitus with other diabetic kidney complication: Secondary | ICD-10-CM

## 2015-09-10 DIAGNOSIS — M170 Bilateral primary osteoarthritis of knee: Secondary | ICD-10-CM | POA: Diagnosis not present

## 2015-09-10 DIAGNOSIS — E1169 Type 2 diabetes mellitus with other specified complication: Secondary | ICD-10-CM

## 2015-09-10 LAB — CBC
HCT: 34.1 % — ABNORMAL LOW (ref 36.0–46.0)
HEMOGLOBIN: 10.8 g/dL — AB (ref 12.0–15.0)
MCH: 26.1 pg (ref 26.0–34.0)
MCHC: 31.7 g/dL (ref 30.0–36.0)
MCV: 82.4 fL (ref 78.0–100.0)
PLATELETS: 240 10*3/uL (ref 150–400)
RBC: 4.14 MIL/uL (ref 3.87–5.11)
RDW: 15.5 % (ref 11.5–15.5)
WBC: 7.1 10*3/uL (ref 4.0–10.5)

## 2015-09-10 LAB — BASIC METABOLIC PANEL
Anion gap: 8 (ref 5–15)
BUN: 24 mg/dL — ABNORMAL HIGH (ref 6–20)
CHLORIDE: 103 mmol/L (ref 101–111)
CO2: 26 mmol/L (ref 22–32)
CREATININE: 2.11 mg/dL — AB (ref 0.44–1.00)
Calcium: 8.9 mg/dL (ref 8.9–10.3)
GFR calc non Af Amer: 21 mL/min — ABNORMAL LOW (ref >60)
GFR, EST AFRICAN AMERICAN: 25 mL/min — AB (ref >60)
Glucose, Bld: 277 mg/dL — ABNORMAL HIGH (ref 65–99)
Potassium: 2.9 mmol/L — ABNORMAL LOW (ref 3.5–5.1)
Sodium: 137 mmol/L (ref 135–145)

## 2015-09-10 LAB — HEPATIC FUNCTION PANEL
ALK PHOS: 96 U/L (ref 38–126)
ALT: 10 U/L — AB (ref 14–54)
AST: 19 U/L (ref 15–41)
Albumin: 3.5 g/dL (ref 3.5–5.0)
TOTAL PROTEIN: 7.6 g/dL (ref 6.5–8.1)
Total Bilirubin: 0.6 mg/dL (ref 0.3–1.2)

## 2015-09-10 LAB — MAGNESIUM: Magnesium: 1.4 mg/dL — ABNORMAL LOW (ref 1.7–2.4)

## 2015-09-10 LAB — I-STAT TROPONIN, ED: Troponin i, poc: 0.01 ng/mL (ref 0.00–0.08)

## 2015-09-10 LAB — BRAIN NATRIURETIC PEPTIDE: B Natriuretic Peptide: 29.8 pg/mL (ref 0.0–100.0)

## 2015-09-10 MED ORDER — CYCLOSPORINE 0.05 % OP EMUL
1.0000 [drp] | Freq: Two times a day (BID) | OPHTHALMIC | Status: DC
  Administered 2015-09-11 – 2015-09-12 (×5): 1 [drp] via OPHTHALMIC
  Filled 2015-09-10 (×7): qty 1

## 2015-09-10 MED ORDER — MAGNESIUM SULFATE 50 % IJ SOLN: 1.0000 g | Freq: Once | INTRAMUSCULAR | Status: DC

## 2015-09-10 MED ORDER — MAGNESIUM SULFATE 2 GM/50ML IV SOLN
2.0000 g | Freq: Once | INTRAVENOUS | Status: AC
  Administered 2015-09-10: 2 g via INTRAVENOUS
  Filled 2015-09-10: qty 50

## 2015-09-10 MED ORDER — GABAPENTIN 100 MG PO CAPS
100.0000 mg | ORAL_CAPSULE | Freq: Three times a day (TID) | ORAL | Status: DC
  Administered 2015-09-11 – 2015-09-12 (×7): 100 mg via ORAL
  Filled 2015-09-10 (×7): qty 1

## 2015-09-10 MED ORDER — SACCHAROMYCES BOULARDII 250 MG PO CAPS
250.0000 mg | ORAL_CAPSULE | Freq: Two times a day (BID) | ORAL | Status: DC
  Administered 2015-09-11 – 2015-09-12 (×5): 250 mg via ORAL
  Filled 2015-09-10 (×8): qty 1

## 2015-09-10 MED ORDER — LUBIPROSTONE 24 MCG PO CAPS
24.0000 ug | ORAL_CAPSULE | Freq: Two times a day (BID) | ORAL | Status: DC
  Administered 2015-09-11 – 2015-09-13 (×5): 24 ug via ORAL
  Filled 2015-09-10 (×8): qty 1

## 2015-09-10 MED ORDER — LATANOPROST 0.005 % OP SOLN
1.0000 [drp] | Freq: Every day | OPHTHALMIC | Status: DC
  Administered 2015-09-11 – 2015-09-12 (×3): 1 [drp] via OPHTHALMIC
  Filled 2015-09-10: qty 2.5

## 2015-09-10 MED ORDER — TIMOLOL MALEATE 0.5 % OP SOLN
1.0000 [drp] | Freq: Two times a day (BID) | OPHTHALMIC | Status: DC
  Administered 2015-09-11 – 2015-09-12 (×5): 1 [drp] via OPHTHALMIC
  Filled 2015-09-10: qty 5

## 2015-09-10 MED ORDER — INSULIN ASPART 100 UNIT/ML ~~LOC~~ SOLN
0.0000 [IU] | Freq: Three times a day (TID) | SUBCUTANEOUS | Status: DC
  Administered 2015-09-11: 3 [IU] via SUBCUTANEOUS
  Administered 2015-09-12: 2 [IU] via SUBCUTANEOUS
  Administered 2015-09-12: 3 [IU] via SUBCUTANEOUS

## 2015-09-10 MED ORDER — SIMVASTATIN 20 MG PO TABS
20.0000 mg | ORAL_TABLET | Freq: Every day | ORAL | Status: DC
  Administered 2015-09-11 – 2015-09-12 (×2): 20 mg via ORAL
  Filled 2015-09-10 (×3): qty 1

## 2015-09-10 MED ORDER — ASPIRIN EC 81 MG PO TBEC
81.0000 mg | DELAYED_RELEASE_TABLET | Freq: Every day | ORAL | Status: DC
  Administered 2015-09-11 – 2015-09-12 (×2): 81 mg via ORAL
  Filled 2015-09-10 (×3): qty 1

## 2015-09-10 MED ORDER — SUCRALFATE 1 G PO TABS
1.0000 g | ORAL_TABLET | Freq: Three times a day (TID) | ORAL | Status: DC
  Administered 2015-09-11 – 2015-09-13 (×9): 1 g via ORAL
  Filled 2015-09-10 (×13): qty 1

## 2015-09-10 MED ORDER — CLONIDINE HCL 0.1 MG PO TABS
0.1000 mg | ORAL_TABLET | Freq: Every day | ORAL | Status: DC
  Administered 2015-09-11 – 2015-09-12 (×2): 0.1 mg via ORAL
  Filled 2015-09-10 (×2): qty 1

## 2015-09-10 MED ORDER — PANTOPRAZOLE SODIUM 40 MG PO TBEC
40.0000 mg | DELAYED_RELEASE_TABLET | Freq: Every day | ORAL | Status: DC
  Administered 2015-09-11 – 2015-09-12 (×2): 40 mg via ORAL
  Filled 2015-09-10 (×2): qty 1

## 2015-09-10 MED ORDER — ALBUTEROL SULFATE HFA 108 (90 BASE) MCG/ACT IN AERS: 1.0000 | INHALATION_SPRAY | Freq: Four times a day (QID) | RESPIRATORY_TRACT | Status: DC | PRN

## 2015-09-10 MED ORDER — INSULIN ASPART PROT & ASPART (70-30 MIX) 100 UNIT/ML ~~LOC~~ SUSP
25.0000 [IU] | Freq: Two times a day (BID) | SUBCUTANEOUS | Status: DC
  Administered 2015-09-11 – 2015-09-13 (×4): 25 [IU] via SUBCUTANEOUS
  Filled 2015-09-10: qty 10

## 2015-09-10 MED ORDER — HYDRALAZINE HCL 50 MG PO TABS
50.0000 mg | ORAL_TABLET | Freq: Two times a day (BID) | ORAL | Status: DC
  Administered 2015-09-11 – 2015-09-12 (×5): 50 mg via ORAL
  Filled 2015-09-10 (×7): qty 1

## 2015-09-10 MED ORDER — INSULIN ASPART 100 UNIT/ML ~~LOC~~ SOLN: 0.0000 [IU] | Freq: Every day | SUBCUTANEOUS | Status: DC

## 2015-09-10 MED ORDER — FUROSEMIDE 10 MG/ML IJ SOLN
40.0000 mg | Freq: Once | INTRAMUSCULAR | Status: AC
  Administered 2015-09-10: 40 mg via INTRAVENOUS
  Filled 2015-09-10: qty 4

## 2015-09-10 MED ORDER — CLINDAMYCIN HCL 300 MG PO CAPS
300.0000 mg | ORAL_CAPSULE | Freq: Four times a day (QID) | ORAL | Status: DC
  Administered 2015-09-11 – 2015-09-13 (×10): 300 mg via ORAL
  Filled 2015-09-10 (×10): qty 1

## 2015-09-10 MED ORDER — HYDROCODONE-ACETAMINOPHEN 10-325 MG PO TABS
1.0000 | ORAL_TABLET | Freq: Three times a day (TID) | ORAL | Status: DC | PRN
  Administered 2015-09-11 – 2015-09-12 (×5): 1 via ORAL
  Filled 2015-09-10 (×5): qty 1

## 2015-09-10 MED ORDER — CILOSTAZOL 100 MG PO TABS
100.0000 mg | ORAL_TABLET | Freq: Every day | ORAL | Status: DC
  Administered 2015-09-11 – 2015-09-12 (×2): 100 mg via ORAL
  Filled 2015-09-10 (×3): qty 1

## 2015-09-10 MED ORDER — METOPROLOL SUCCINATE ER 25 MG PO TB24
25.0000 mg | ORAL_TABLET | Freq: Every day | ORAL | Status: DC
  Administered 2015-09-11 – 2015-09-12 (×2): 25 mg via ORAL
  Filled 2015-09-10 (×4): qty 1

## 2015-09-10 MED ORDER — SERTRALINE HCL 50 MG PO TABS
50.0000 mg | ORAL_TABLET | Freq: Every day | ORAL | Status: DC
  Administered 2015-09-11 – 2015-09-12 (×2): 50 mg via ORAL
  Filled 2015-09-10 (×2): qty 1

## 2015-09-10 MED ORDER — SODIUM CHLORIDE 0.9 % IJ SOLN: 3.0000 mL | INTRAMUSCULAR | Status: DC | PRN

## 2015-09-10 MED ORDER — IPRATROPIUM-ALBUTEROL 0.5-2.5 (3) MG/3ML IN SOLN
3.0000 mL | Freq: Four times a day (QID) | RESPIRATORY_TRACT | Status: DC
  Administered 2015-09-11: 3 mL via RESPIRATORY_TRACT
  Filled 2015-09-10: qty 3

## 2015-09-10 MED ORDER — DULOXETINE HCL 30 MG PO CPEP
30.0000 mg | ORAL_CAPSULE | Freq: Every day | ORAL | Status: DC
  Administered 2015-09-11 – 2015-09-12 (×2): 30 mg via ORAL
  Filled 2015-09-10 (×3): qty 1

## 2015-09-10 MED ORDER — ONDANSETRON HCL 4 MG/2ML IJ SOLN: 4.0000 mg | Freq: Four times a day (QID) | INTRAMUSCULAR | Status: DC | PRN

## 2015-09-10 MED ORDER — POTASSIUM CHLORIDE CRYS ER 20 MEQ PO TBCR
80.0000 meq | EXTENDED_RELEASE_TABLET | Freq: Once | ORAL | Status: AC
  Administered 2015-09-10: 80 meq via ORAL
  Filled 2015-09-10: qty 4

## 2015-09-10 MED ORDER — HEPARIN SODIUM (PORCINE) 5000 UNIT/ML IJ SOLN
5000.0000 [IU] | Freq: Three times a day (TID) | INTRAMUSCULAR | Status: DC
  Administered 2015-09-11 – 2015-09-13 (×7): 5000 [IU] via SUBCUTANEOUS
  Filled 2015-09-10 (×9): qty 1

## 2015-09-10 MED ORDER — BUDESONIDE 0.25 MG/2ML IN SUSP
0.2500 mg | Freq: Two times a day (BID) | RESPIRATORY_TRACT | Status: DC
  Administered 2015-09-11 – 2015-09-13 (×6): 0.25 mg via RESPIRATORY_TRACT
  Filled 2015-09-10 (×6): qty 2

## 2015-09-10 MED ORDER — SODIUM CHLORIDE 0.9 % IJ SOLN
3.0000 mL | Freq: Two times a day (BID) | INTRAMUSCULAR | Status: DC
  Administered 2015-09-11 – 2015-09-12 (×4): 3 mL via INTRAVENOUS

## 2015-09-10 MED ORDER — SODIUM CHLORIDE 0.9 % IV SOLN: 250.0000 mL | INTRAVENOUS | Status: DC | PRN

## 2015-09-10 NOTE — ED Provider Notes (Signed)
CSN: [id number]     Arrival date & time 09/10/15  1840 History   First MD Initiated Contact with Patient 09/10/15 1944     Chief Complaint  Patient presents with  . Leg Swelling  . Shortness of Breath     (Consider location/radiation/quality/duration/timing/severity/associated sxs/prior Treatment) HPI 79 year old female who presents with edema and shortness of breath. History of type 2 diabetes, hypertension, hyperlipidemia, and chronic kidney disease. Also has a history of congestive heart failure and recently admitted for fluid overload. At that time had a echo showing normal systolic function, but unclear diastolic dysfunction. Reports that she does not take home oxygen, and has had increasing dyspnea on exertion over the past 2-3 days, and now dyspnea at rest. Has not had any increasing cough, fevers, sputum production, or chills. Has noted that she has had a 6 pound weight gain in over 3 days as well as increased swelling in her abdomen and lower extremities. Has had a new orthopnea over the past few months, but that is unchanged. And has recently had increasing episodes of waking up in the middle of the night feeling winded. Reports intermittent chest pain at rest, lasting for few seconds. No associated with exertion.   Past Medical History  Diagnosis Date  . Diabetes mellitus   . Hypertension   . Obese   . Hiatal hernia   . Scoliosis   . Arthritis   . Acute bronchitis   . COPD (chronic obstructive pulmonary disease) ([place])   . Unspecified hereditary and idiopathic peripheral neuropathy   . Secondary diabetes mellitus with renal manifestations, not stated as uncontrolled, or unspecified   . Chronic kidney disease, stage II (mild)   . Chronic kidney disease, stage II (mild)   . Chronic kidney disease, unspecified ([place])   . Disorder of bone and cartilage, unspecified   . Complications affecting other specified body systems, hypertension   . Hypopotassemia   . Anxiety   .  Congestive heart failure, unspecified   . GERD (gastroesophageal reflux disease)   . Lumbago   . Insomnia, unspecified   . Hyperlipidemia   . Unspecified glaucoma   . Diaphragmatic hernia without mention of obstruction or gangrene   . Osteoarthrosis, unspecified whether generalized or localized, unspecified site   . Degenerative arthritis of knee, bilateral    Past Surgical History  Procedure Laterality Date  . Abdominal hysterectomy    . Shoulder surgery    . Total hip arthroplasty      bilateral  . Knee arthroscopy      bilateral  . Colonoscopy  08/06/2011  . Foot surgery     Family History  Problem Relation Age of Onset  . Diabetes Mother   . Heart disease Mother   . Heart disease Brother   . Diabetes Brother   . Kidney disease Father   . Scoliosis Sister   . Diabetes Brother   . Diabetes Brother   . Heart disease Brother   . Scoliosis Brother   . Stroke Brother   . Heart attack Brother    Social History  Substance Use Topics  . Smoking status: Former Smoker    Types: Cigarettes    Quit date: 02/27/1997  . Smokeless tobacco: Never Used  . Alcohol Use: No   OB History    No data available     Review of Systems 10/14 systems reviewed and are negative other than those stated in the HPI   Allergies  Tramadol; Codeine; Hydrocodone; Oxycodone; Tylenol; and  Penicillins  Home Medications   Prior to Admission medications   Medication Sig Start Date End Date Taking? Authorizing Provider  albuterol (PROVENTIL HFA;VENTOLIN HFA) 108 (90 BASE) MCG/ACT inhaler Inhale 1 puff into the lungs every 6 (six) hours as needed for wheezing or shortness of breath.   Yes Historical Provider, MD  aspirin EC 81 MG tablet Take 81 mg by mouth daily.   Yes Historical Provider, MD  budesonide (PULMICORT) 0.25 MG/2ML nebulizer solution Take 2 mLs (0.25 mg total) by nebulization 2 (two) times daily. 08/20/15  Yes [name], NP  cilostazol (PLETAL) 100 MG tablet TAKE 1 TABLET BY  MOUTH EVERY DAY 07/24/15  Yes [name], NP  cloNIDine (CATAPRES) 0.1 MG tablet TAKE 1 TABLET BY MOUTH EVERY DAY 02/07/15  Yes Mahima Pandey, MD  cycloSPORINE (RESTASIS) 0.05 % ophthalmic emulsion Place 1 drop into both eyes 2 (two) times daily. One drop once a day bilaterally for dry eyes   Yes Historical Provider, MD  diclofenac sodium (VOLTAREN) 1 % GEL Apply 4 g topically 2 (two) times daily as needed. For pain   Yes Historical Provider, MD  DULoxetine (CYMBALTA) 30 MG capsule TAKE 1 CAPSULE BY MOUTH EVERY DAY TO HELP ANXIETY AND PAINS 07/24/15  Yes [name], NP  esomeprazole (NEXIUM) 40 MG capsule Take 1 capsule (40 mg total) by mouth daily. 07/16/15  Yes [name], NP  furosemide (LASIX) 40 MG tablet Take 1 tablet (40 mg total) by mouth daily. 08/17/15  Yes Theodis Blaze, MD  gabapentin (NEURONTIN) 100 MG capsule Take 2 capsules (200 mg total) by mouth 3 (three) times daily. Patient taking differently: Take 100 mg by mouth 3 (three) times daily.  05/30/15  Yes [name], NP  hydrALAZINE (APRESOLINE) 50 MG tablet TAKE 1 TABLET BY MOUTH TWICE DAILY 09/02/15  Yes [name], NP  HYDROcodone-acetaminophen (NORCO) 10-325 MG per tablet Take 1 tablet by mouth every 8 (eight) hours as needed for moderate pain. 08/17/15  Yes Theodis Blaze, MD  insulin NPH-regular Human (HUMULIN 70/30) (70-30) 100 UNIT/ML injection Inject 40 units subcutaneously with breakfast and 20 units with dinner to control blood sugar. Patient taking differently: Inject 20-40 Units into the skin 2 (two) times daily with a meal. Inject 40 units subcutaneously with breakfast and 20 units with dinner to control blood sugar. 02/01/15  Yes Estill Dooms, MD  lubiprostone (AMITIZA) 24 MCG capsule Take 1 capsule (24 mcg total) by mouth 2 (two) times daily with a meal. 05/30/15  Yes [name], NP  metoprolol succinate (TOPROL-XL) 25 MG 24 hr tablet TAKE 1 TABLET BY MOUTH EVERY DAY WITH OR IMMEDIATELY  FOLLOWING A MEAL FOR BLOOD PRESSURE 07/24/15  Yes [name], NP  potassium chloride SA (K-DUR,KLOR-CON) 20 MEQ tablet TAKE 1 TABLET BY MOUTH EVERY DAY 09/02/15  Yes [name], NP  sertraline (ZOLOFT) 50 MG tablet TAKE 1 TABLET BY MOUTH EVERY DAY 07/24/15  Yes [name], NP  simvastatin (ZOCOR) 20 MG tablet TAKE 1 TABLET BY MOUTH EVERY DAY 07/24/15  Yes [name], NP  sucralfate (CARAFATE) 1 G tablet TAKE 1 TABLET BY MOUTH TWICE DAILY ON EMPTY STOMACH 07/24/15  Yes [name], NP  timolol (TIMOPTIC) 0.5 % ophthalmic solution Place 1 drop into both eyes 2 (two) times daily. One drop in bilaterally twice a day as needed   Yes Historical Provider, MD  torsemide (DEMADEX) 20 MG tablet 20 mg  daily.  08/28/15  Yes Historical Provider, MD  Travoprost, BAK Free, (TRAVATAN) 0.004 % SOLN ophthalmic solution Place 1 drop into both eyes at bedtime. One drop bilaterally twice a day   Yes Historical Provider, MD  [place] X 5/16" 0.5 ML MISC USE UP TO 2 TIMES A DAY AS DIRECTED 05/29/15   [name], NP  guaifenesin (ROBITUSSIN) 100 MG/5ML syrup Take 5-10 mLs (100-200 mg total) by mouth every 4 (four) hours as needed for cough. 10/15/14   Milton Ferguson, MD  ipratropium-albuterol (DUONEB) 0.5-2.5 (3) MG/3ML SOLN Take 3 mLs by nebulization every 4 (four) hours as needed. 08/21/15   Benjamin Cartner, PA-C   BP 150/70 mmHg  Pulse 67  Temp(Src) 98.2 F (36.8 C) (Oral)  Resp 20  Ht 5\' 4"  (1.626 m)  Wt 227 lb (102.967 kg)  BMI 38.95 kg/m2  SpO2 95% Physical Exam Physical Exam  Nursing note and vitals reviewed. Constitutional: Well developed, well nourished, non-toxic, and in no acute distress Head: Normocephalic and atraumatic.  Mouth/Throat: Oropharynx is clear and moist.  Neck: Normal range of motion. Neck supple.  Cardiovascular: Normal rate and regular rhythm.  right > left lower extremity edema.  Pulmonary/Chest: Mild tachypnea. No conversational  dyspnea. Overall clear to auscultation bilaterally.  Abdominal: Soft. There is no tenderness. There is no rebound and no guarding.  Musculoskeletal: RLE with > LLE swelling. Some mild erythema and tenderness overlying the medial aspect of the left ankle. No drainage or wounds. Neurological: Alert, no facial droop, fluent speech, moves all extremities symmetrically Skin: Skin is warm and dry.  Psychiatric: Cooperative  ED Course  Procedures (including critical care time) Labs Review Labs Reviewed  BASIC METABOLIC PANEL - Abnormal; Notable for the following:    Potassium 2.9 (*)    Glucose, Bld 277 (*)    BUN 24 (*)    Creatinine, Ser 2.11 (*)    GFR calc non Af Amer 21 (*)    GFR calc Af Amer 25 (*)    All other components within normal limits  CBC - Abnormal; Notable for the following:    Hemoglobin 10.8 (*)    HCT 34.1 (*)    All other components within normal limits  HEPATIC FUNCTION PANEL - Abnormal; Notable for the following:    ALT 10 (*)    Bilirubin, Direct <0.1 (*)    All other components within normal limits  MAGNESIUM - Abnormal; Notable for the following:    Magnesium 1.4 (*)    All other components within normal limits  COMPREHENSIVE METABOLIC PANEL - Abnormal; Notable for the following:    Potassium 3.4 (*)    Glucose, Bld 138 (*)    BUN 22 (*)    Creatinine, Ser 2.09 (*)    Calcium 8.7 (*)    Albumin 3.0 (*)    AST 14 (*)    ALT 8 (*)    GFR calc non Af Amer 22 (*)    GFR calc Af Amer 25 (*)    All other components within normal limits  GLUCOSE, CAPILLARY - Abnormal; Notable for the following:    Glucose-Capillary 173 (*)    All other components within normal limits  GLUCOSE, CAPILLARY - Abnormal; Notable for the following:    Glucose-Capillary 155 (*)    All other components within normal limits  BRAIN NATRIURETIC PEPTIDE  TROPONIN I  TROPONIN I  MAGNESIUM  TROPONIN I  I-STAT TROPOININ, ED    Imaging Review  Dg Chest 2 View  09/10/2015   CLINICAL DATA:  Cough and shortness of breath for few days EXAM: CHEST - 2 VIEW COMPARISON:  08/21/2015 FINDINGS: Cardiac shadow is mildly enlarged but stable. Lungs are well aerated bilaterally. No focal infiltrate or sizable effusion is seen. Very mild vascular congestion is noted. IMPRESSION: Mild vascular congestion without infiltrate. Electronically Signed   By: Inez Catalina M.D.   On: 09/10/2015 19:57   I have personally reviewed and evaluated these images and lab results as part of my medical decision-making.   EKG Interpretation   Date/Time:  Tuesday September 10 2015 19:10:35 EDT Ventricular Rate:  81 PR Interval:  124 QRS Duration: 87 QT Interval:  351 QTC Calculation: 407 R Axis:   73 Text Interpretation:  Sinus rhythm Repol abnrm suggests ischemia, inferior  leads Baseline wander in lead(s) II III aVF V1 No significant change since  last tracing Confirmed by Aydan Levitz MD, Delayza Lungren [id number]) on 09/10/2015 8:54:06 PM      MDM   Final diagnoses:  Hypervolemia, unspecified hypervolemia type  CKD (chronic kidney disease), unspecified stage  Acute on chronic diastolic congestive heart failure (HCC)    Appears fluid overloaded on exam. Bilateral LE right > left, with recent DVT US showing no DVT. Exquisitely tender in the RLE with mild overlying skin changes. ? New cellulitis. No systemic symptoms of infection currently. No significant wheezing on lung exam, and presentation not consistent with COPD exacerbation. Chest x-ray with pulmonary vascular congestion. Her pulse ox 89-90% on room air, and does require some supplemental oxygen. Kidney function near baseline. EKG is nonischemic and troponin is negative. Given obvious fluid overload, this is likely etiology of her shortness of breath and new oxygen requirement. We will admit her for diuresis. Given IV Lasix here in the emergency department.    Forde Dandy, MD 09/11/15 [phone]

## 2015-09-10 NOTE — ED Notes (Signed)
Pt has lower extremity swelling with warmth to the lower extremity. Pt states this has been going on for about 3 weeks.

## 2015-09-10 NOTE — ED Notes (Signed)
Provided patient Michelle Buck and ginger ale with permission from provider.

## 2015-09-10 NOTE — H&P (Signed)
[facility] History and Physical  Patient: Michelle Buck  MRN: [id number]  DOB: 09/26/1936  DOS: the patient was seen and examined on 09/10/2015 PCP: [name], NP  Referring physician: Dr. Oleta Mouse Chief Complaint: Shortness of breath  HPI: Lawson Manjarrez is a 79 y.o. female with Past medical history of diabetes mellitus, hypertension, obesity, COPD, chronic kidney disease, neuropathy, chronic diastolic dysfunction. The patient is presenting with complaints of progressively worsening shortness of breath ongoing for last 1 week. She mentions that she does have shortness of breath since her recent hospitalization and she also has some cough but since last one week she has been having increasing shortness of breath on exertion as well as occasional chest tightness on exertion. She also complains of orthopnea. She complains of recurrent PND. She denies having any fever or chills. She mentions her voice has remained hoarse from her recent hospitalization. She also mentions about some gained in her weight. Next and she complains of increasing swelling in her legs which is not improving. She denies any recent change in her medications. She mentions she is compliant with all her medications.  The patient is coming from home.  At her baseline ambulates with support And is independent for most of her ADL; manages her medication on her own.  Review of Systems: as mentioned in the history of present illness.  A comprehensive review of the other systems is negative.  Past Medical History  Diagnosis Date  . Diabetes mellitus   . Hypertension   . Obese   . Hiatal hernia   . Scoliosis   . Arthritis   . Acute bronchitis   . COPD (chronic obstructive pulmonary disease) ([place])   . Unspecified hereditary and idiopathic peripheral neuropathy   . Secondary diabetes mellitus with renal manifestations, not stated as uncontrolled, or unspecified   . Chronic kidney disease, stage II (mild)   .  Chronic kidney disease, stage II (mild)   . Chronic kidney disease, unspecified ([place])   . Disorder of bone and cartilage, unspecified   . Complications affecting other specified body systems, hypertension   . Hypopotassemia   . Anxiety   . Congestive heart failure, unspecified   . GERD (gastroesophageal reflux disease)   . Lumbago   . Insomnia, unspecified   . Hyperlipidemia   . Unspecified glaucoma   . Diaphragmatic hernia without mention of obstruction or gangrene   . Osteoarthrosis, unspecified whether generalized or localized, unspecified site   . Degenerative arthritis of knee, bilateral    Past Surgical History  Procedure Laterality Date  . Abdominal hysterectomy    . Shoulder surgery    . Total hip arthroplasty      bilateral  . Knee arthroscopy      bilateral  . Colonoscopy  08/06/2011  . Foot surgery     Social History:  reports that she quit smoking about 18 years ago. Her smoking use included Cigarettes. She has never used smokeless tobacco. She reports that she does not drink alcohol or use illicit drugs.  Allergies  Allergen Reactions  . Tramadol Other (See Comments)    Leg cramps   . Codeine Nausea And Vomiting    Patient states N/V with codeine  . Hydrocodone Nausea And Vomiting  . Oxycodone Other (See Comments)    Patient states she can tolerate oxycodone  . Tylenol [Acetaminophen] Hives  . Penicillins Rash    Patient states rash/itch with penicillin Has patient had a PCN reaction causing immediate rash, facial/tongue/throat swelling,  SOB or lightheadedness with hypotension: Yes- broke me out and i was itching Has patient had a PCN reaction causing severe rash involving mucus membranes or skin necrosis: No Has patient had a PCN reaction that required hospitalization Yes- i was already in the hospital Has patient had a PCN reaction occurring within the last 10 years: No If all of the above answers are "NO    Family History  Problem Relation Age of Onset    . Diabetes Mother   . Heart disease Mother   . Heart disease Brother   . Diabetes Brother   . Kidney disease Father   . Scoliosis Sister   . Diabetes Brother   . Diabetes Brother   . Heart disease Brother   . Scoliosis Brother   . Stroke Brother   . Heart attack Brother     Prior to Admission medications   Medication Sig Start Date End Date Taking? Authorizing Provider  albuterol (PROVENTIL HFA;VENTOLIN HFA) 108 (90 BASE) MCG/ACT inhaler Inhale 1 puff into the lungs every 6 (six) hours as needed for wheezing or shortness of breath.   Yes Historical Provider, MD  aspirin EC 81 MG tablet Take 81 mg by mouth daily.   Yes Historical Provider, MD  budesonide (PULMICORT) 0.25 MG/2ML nebulizer solution Take 2 mLs (0.25 mg total) by nebulization 2 (two) times daily. 08/20/15  Yes [name], NP  cilostazol (PLETAL) 100 MG tablet TAKE 1 TABLET BY MOUTH EVERY DAY 07/24/15  Yes [name], NP  cloNIDine (CATAPRES) 0.1 MG tablet TAKE 1 TABLET BY MOUTH EVERY DAY 02/07/15  Yes Mahima Pandey, MD  cycloSPORINE (RESTASIS) 0.05 % ophthalmic emulsion Place 1 drop into both eyes 2 (two) times daily. One drop once a day bilaterally for dry eyes   Yes Historical Provider, MD  diclofenac sodium (VOLTAREN) 1 % GEL Apply 4 g topically 2 (two) times daily as needed. For pain   Yes Historical Provider, MD  DULoxetine (CYMBALTA) 30 MG capsule TAKE 1 CAPSULE BY MOUTH EVERY DAY TO HELP ANXIETY AND PAINS 07/24/15  Yes [name], NP  esomeprazole (NEXIUM) 40 MG capsule Take 1 capsule (40 mg total) by mouth daily. 07/16/15  Yes [name], NP  furosemide (LASIX) 40 MG tablet Take 1 tablet (40 mg total) by mouth daily. 08/17/15  Yes Theodis Blaze, MD  gabapentin (NEURONTIN) 100 MG capsule Take 2 capsules (200 mg total) by mouth 3 (three) times daily. Patient taking differently: Take 100 mg by mouth 3 (three) times daily.  05/30/15  Yes [name], NP  hydrALAZINE (APRESOLINE) 50 MG tablet  TAKE 1 TABLET BY MOUTH TWICE DAILY 09/02/15  Yes [name], NP  HYDROcodone-acetaminophen (NORCO) 10-325 MG per tablet Take 1 tablet by mouth every 8 (eight) hours as needed for moderate pain. 08/17/15  Yes Theodis Blaze, MD  insulin NPH-regular Human (HUMULIN 70/30) (70-30) 100 UNIT/ML injection Inject 40 units subcutaneously with breakfast and 20 units with dinner to control blood sugar. Patient taking differently: Inject 20-40 Units into the skin 2 (two) times daily with a meal. Inject 40 units subcutaneously with breakfast and 20 units with dinner to control blood sugar. 02/01/15  Yes Estill Dooms, MD  lubiprostone (AMITIZA) 24 MCG capsule Take 1 capsule (24 mcg total) by mouth 2 (two) times daily with a meal. 05/30/15  Yes [name], NP  metoprolol succinate (TOPROL-XL) 25 MG 24 hr tablet TAKE 1 TABLET BY MOUTH EVERY DAY WITH  OR IMMEDIATELY FOLLOWING A MEAL FOR BLOOD PRESSURE 07/24/15  Yes [name], NP  potassium chloride SA (K-DUR,KLOR-CON) 20 MEQ tablet TAKE 1 TABLET BY MOUTH EVERY DAY 09/02/15  Yes [name], NP  sertraline (ZOLOFT) 50 MG tablet TAKE 1 TABLET BY MOUTH EVERY DAY 07/24/15  Yes [name], NP  simvastatin (ZOCOR) 20 MG tablet TAKE 1 TABLET BY MOUTH EVERY DAY 07/24/15  Yes [name], NP  sucralfate (CARAFATE) 1 G tablet TAKE 1 TABLET BY MOUTH TWICE DAILY ON EMPTY STOMACH 07/24/15  Yes [name], NP  timolol (TIMOPTIC) 0.5 % ophthalmic solution Place 1 drop into both eyes 2 (two) times daily. One drop in bilaterally twice a day as needed   Yes Historical Provider, MD  torsemide (DEMADEX) 20 MG tablet 20 mg daily.  08/28/15  Yes Historical Provider, MD  Travoprost, BAK Free, (TRAVATAN) 0.004 % SOLN ophthalmic solution Place 1 drop into both eyes at bedtime. One drop bilaterally twice a day   Yes Historical Provider, MD  [place] X 5/16" 0.5 ML MISC USE UP TO 2 TIMES A DAY AS DIRECTED 05/29/15   [name], NP    guaifenesin (ROBITUSSIN) 100 MG/5ML syrup Take 5-10 mLs (100-200 mg total) by mouth every 4 (four) hours as needed for cough. 10/15/14   Milton Ferguson, MD  ipratropium-albuterol (DUONEB) 0.5-2.5 (3) MG/3ML SOLN Take 3 mLs by nebulization every 4 (four) hours as needed. 08/21/15   Comer Locket, PA-C    Physical Exam: Filed Vitals:   09/10/15 2230 09/10/15 2300 09/10/15 2307 09/10/15 2341  BP: 145/70 136/64 136/64 138/80  Pulse: 77 76 74 62  Temp:      TempSrc:      Resp: 23 16 16 21   SpO2: 97% 94% 98% 100%    General: Alert, Awake and Oriented to Time, Place and Person. Appear in mild distress Eyes: PERRL ENT: Oral Mucosa clear moist. Neck: Difficult to assess JVD, upper airway wheezing Cardiovascular: S1 and S2 Present, no Murmur, Peripheral Pulses Present Respiratory: Bilateral Air entry equal and Decreased,  Faint basal Crackles, upper airway wheezes Abdomen: Bowel Sound present, Soft and no tenderness Skin: Right lower extremity redness warmth and tenderness Extremities: Bilateral Pedal edema, no calf tenderness Neurologic: Grossly no focal neuro deficit.  Labs on Admission:  CBC:  Recent Labs Lab 09/10/15 1922  WBC 7.1  HGB 10.8*  HCT 34.1*  MCV 82.4  PLT 240    CMP     Component Value Date/Time   NA 137 09/10/2015 1922   NA 145* 08/27/2015 1446   K 2.9* 09/10/2015 1922   CL 103 09/10/2015 1922   CO2 26 09/10/2015 1922   GLUCOSE 277* 09/10/2015 1922   GLUCOSE 121* 08/27/2015 1446   BUN 24* 09/10/2015 1922   BUN 43* 08/27/2015 1446   CREATININE 2.11* 09/10/2015 1922   CALCIUM 8.9 09/10/2015 1922   CALCIUM 8.6 05/14/2010 2347   PROT 7.6 09/10/2015 1922   PROT 6.2 08/20/2015 1141   ALBUMIN 3.5 09/10/2015 1922   ALBUMIN 3.5 08/20/2015 1141   AST 19 09/10/2015 1922   ALT 10* 09/10/2015 1922   ALKPHOS 96 09/10/2015 1922   BILITOT 0.6 09/10/2015 1922   BILITOT 0.2 08/20/2015 1141   GFRNONAA 21* 09/10/2015 1922   GFRAA 25* 09/10/2015 1922    No  results for input(s): CKTOTAL, CKMB, CKMBINDEX, TROPONINI in the last 168 hours. BNP (last 3 results)  Recent Labs  08/12/15 0454  08/21/15 1743 09/10/15 1922  BNP 149.2* 24.6 29.8    ProBNP (last 3 results)  Recent Labs  10/15/14 1536  PROBNP 110.2     Radiological Exams on Admission: Dg Chest 2 View  09/10/2015   CLINICAL DATA:  Cough and shortness of breath for few days  EXAM: CHEST - 2 VIEW  COMPARISON:  08/21/2015  FINDINGS: Cardiac shadow is mildly enlarged but stable. Lungs are well aerated bilaterally. No focal infiltrate or sizable effusion is seen. Very mild vascular congestion is noted.  IMPRESSION: Mild vascular congestion without infiltrate.   Electronically Signed   By: Inez Catalina M.D.   On: 09/10/2015 19:57   EKG: Independently reviewed. normal sinus rhythm, nonspecific ST and T waves changes.  Assessment/Plan 1. Diastolic dysfunction with acute on chronic heart failure [facility]) Patient presents with, as of progressively worsening shortness of breath as well as weight gain as well as persistent edema along with PND. This is consistent with acute on chronic diastolic dysfunction. Next and the patient has received IV Lasix in the ER. We will continue monitoring her renal function and the dose Lasix as needed. Follow serial troponin. Recent echocardiogram EF 65%.  2. Hypokalemia and hypomagnesemia. Currently replacing.  3 cellulitis of the leg. The patient is found to be having cellulitis of the right leg. I would treat her with clindamycin.  Patient had recent ultrasound Doppler which ruled out DVT.  4  Obesity Patient's symptoms of PND and also represent a possible sleep apnea. Patient will benefit from outpatient workup related to sleep apnea.  5  Essential hypertension, benign Continue her home medications.  6  COPD (chronic obstructive pulmonary disease) ([place]) Patient does have upper abdomen wheezing. He does not appear to be having a COPD  exacerbation. Patient will benefit from a ENT workup as an outpatient.  7  Peripheral autonomic neuropathy due to diabetes mellitus ([place])   DM type 2, uncontrolled, with renal complications ([place])  Continue home medications. Placing her on sliding scale insulin.  Nutrition: Cardiac and diabetic diet DVT Prophylaxis: subcutaneous Heparin  Advance goals of care discussion: DNR/DNI as per my discussion with patient   Family Communication: family was present at bedside, opportunity was given to ask question and all questions were answered satisfactorily at the time of interview. Disposition: Admitted as observation, telemetry unit.  Author: Berle Mull, MD [facility] Pager: [phone] 09/10/2015  If 7PM-7AM, please contact night-coverage [email or web address] Password [email or web address]

## 2015-09-10 NOTE — ED Notes (Signed)
Pt c/o increasing BLE edema and SOB x "a few days."  Pain score 8/10.  Pt reports gaining 6 pounds in 3 days.  Sts "I can't catch my breath when I lay down."  3+ pitting edema noted.  Hx of CHF and reports compliance w/ Lasix.

## 2015-09-11 DIAGNOSIS — J9601 Acute respiratory failure with hypoxia: Secondary | ICD-10-CM | POA: Diagnosis not present

## 2015-09-11 DIAGNOSIS — E876 Hypokalemia: Secondary | ICD-10-CM

## 2015-09-11 DIAGNOSIS — E669 Obesity, unspecified: Secondary | ICD-10-CM

## 2015-09-11 DIAGNOSIS — N184 Chronic kidney disease, stage 4 (severe): Secondary | ICD-10-CM | POA: Diagnosis not present

## 2015-09-11 DIAGNOSIS — I10 Essential (primary) hypertension: Secondary | ICD-10-CM

## 2015-09-11 DIAGNOSIS — E0821 Diabetes mellitus due to underlying condition with diabetic nephropathy: Secondary | ICD-10-CM

## 2015-09-11 DIAGNOSIS — E0843 Diabetes mellitus due to underlying condition with diabetic autonomic (poly)neuropathy: Secondary | ICD-10-CM

## 2015-09-11 DIAGNOSIS — D631 Anemia in chronic kidney disease: Secondary | ICD-10-CM | POA: Diagnosis present

## 2015-09-11 DIAGNOSIS — I5033 Acute on chronic diastolic (congestive) heart failure: Secondary | ICD-10-CM

## 2015-09-11 DIAGNOSIS — E1121 Type 2 diabetes mellitus with diabetic nephropathy: Secondary | ICD-10-CM

## 2015-09-11 DIAGNOSIS — L03115 Cellulitis of right lower limb: Secondary | ICD-10-CM | POA: Diagnosis not present

## 2015-09-11 DIAGNOSIS — J441 Chronic obstructive pulmonary disease with (acute) exacerbation: Secondary | ICD-10-CM | POA: Diagnosis present

## 2015-09-11 DIAGNOSIS — N189 Chronic kidney disease, unspecified: Secondary | ICD-10-CM

## 2015-09-11 DIAGNOSIS — L0390 Cellulitis, unspecified: Secondary | ICD-10-CM | POA: Diagnosis present

## 2015-09-11 DIAGNOSIS — Z794 Long term (current) use of insulin: Secondary | ICD-10-CM

## 2015-09-11 DIAGNOSIS — E119 Type 2 diabetes mellitus without complications: Secondary | ICD-10-CM

## 2015-09-11 LAB — COMPREHENSIVE METABOLIC PANEL
ALT: 8 U/L — AB (ref 14–54)
AST: 14 U/L — AB (ref 15–41)
Albumin: 3 g/dL — ABNORMAL LOW (ref 3.5–5.0)
Alkaline Phosphatase: 84 U/L (ref 38–126)
Anion gap: 7 (ref 5–15)
BUN: 22 mg/dL — ABNORMAL HIGH (ref 6–20)
CHLORIDE: 104 mmol/L (ref 101–111)
CO2: 31 mmol/L (ref 22–32)
CREATININE: 2.09 mg/dL — AB (ref 0.44–1.00)
Calcium: 8.7 mg/dL — ABNORMAL LOW (ref 8.9–10.3)
GFR calc non Af Amer: 22 mL/min — ABNORMAL LOW (ref >60)
GFR, EST AFRICAN AMERICAN: 25 mL/min — AB (ref >60)
Glucose, Bld: 138 mg/dL — ABNORMAL HIGH (ref 65–99)
Potassium: 3.4 mmol/L — ABNORMAL LOW (ref 3.5–5.1)
SODIUM: 142 mmol/L (ref 135–145)
Total Bilirubin: 0.3 mg/dL (ref 0.3–1.2)
Total Protein: 6.7 g/dL (ref 6.5–8.1)

## 2015-09-11 LAB — TROPONIN I
Troponin I: <0.03 ng/mL (ref <0.031)
Troponin I: <0.03 ng/mL (ref <0.031)

## 2015-09-11 LAB — GLUCOSE, CAPILLARY
GLUCOSE-CAPILLARY: 155 mg/dL — AB (ref 65–99)
GLUCOSE-CAPILLARY: 173 mg/dL — AB (ref 65–99)
GLUCOSE-CAPILLARY: 70 mg/dL (ref 65–99)
Glucose-Capillary: 107 mg/dL — ABNORMAL HIGH (ref 65–99)
Glucose-Capillary: 66 mg/dL (ref 65–99)
Glucose-Capillary: 54 mg/dL — ABNORMAL LOW (ref 65–99)
Glucose-Capillary: 101 mg/dL — ABNORMAL HIGH (ref 65–99)
Glucose-Capillary: 66 mg/dL (ref 65–99)

## 2015-09-11 LAB — MAGNESIUM: MAGNESIUM: 1.9 mg/dL (ref 1.7–2.4)

## 2015-09-11 MED ORDER — IPRATROPIUM-ALBUTEROL 0.5-2.5 (3) MG/3ML IN SOLN: 3.0000 mL | RESPIRATORY_TRACT | Status: DC | PRN

## 2015-09-11 MED ORDER — POLYETHYLENE GLYCOL 3350 17 G PO PACK
17.0000 g | PACK | Freq: Every day | ORAL | Status: DC | PRN
  Administered 2015-09-11 – 2015-09-12 (×2): 17 g via ORAL
  Filled 2015-09-11 (×2): qty 1

## 2015-09-11 MED ORDER — FUROSEMIDE 10 MG/ML IJ SOLN
40.0000 mg | Freq: Every day | INTRAMUSCULAR | Status: DC
  Administered 2015-09-11 – 2015-09-12 (×2): 40 mg via INTRAVENOUS
  Filled 2015-09-11 (×2): qty 4

## 2015-09-11 MED ORDER — IPRATROPIUM-ALBUTEROL 0.5-2.5 (3) MG/3ML IN SOLN
3.0000 mL | Freq: Three times a day (TID) | RESPIRATORY_TRACT | Status: DC
  Administered 2015-09-11 – 2015-09-13 (×6): 3 mL via RESPIRATORY_TRACT
  Filled 2015-09-11 (×6): qty 3

## 2015-09-11 MED ORDER — ALBUTEROL SULFATE (2.5 MG/3ML) 0.083% IN NEBU: 2.5000 mg | INHALATION_SOLUTION | Freq: Four times a day (QID) | RESPIRATORY_TRACT | Status: DC | PRN

## 2015-09-11 NOTE — Progress Notes (Addendum)
Patient ID: Michelle Buck, female   DOB: 1936-07-01, 79 y.o.   MRN: [id number]  [facility] PROGRESS NOTE  Michelle Buck [id number] DOB: Nov 08, 1936 DOA: 09/10/2015 PCP: [name], NP   Brief narrative:    79 y.o. female with known DM with complications of neuropathy, hypertension, obesity, COPD, CKD stage III - IV, chronic diastolic dysfunction (last 2 D ECHO 08/2015 with stable EF), presented to [facility] ED with main concern of 1 week duration of progressive dyspnea that initially started with exertion and has progressed to dyspnea at rest in the past 24 - 48 hours, associated with worsening 2-3 pillow orthopnea and non productive cough, LE swelling, weight gain but unknown amount.   Assessment/Plan:    Principal Problem:   Acute respiratory failure with hypoxia (HCC) - this is multifactorial and secondary to acute COPD with acute on chronic diastolic CHF - pt takes Lasix 40 mg PO QD, will place on Lasix 40 mg IV for now for better diuresis - also keep on BD's scheduled and as needed - avoid steroids due to DM and wheezing is well controlled on BD's   Active Problems:   Acute on chronic diastolic congestive heart failure (HCC) - treat with lasix 40 mg IV - monitor daily weights, strict I/O - weight today is 102.9 kg - monitor renal function closely     Cellulitis, RLE - continue Clindamycin day #2     Chronic kidney disease (CKD), stage IV (severe) (HCC) - last Cr about 3 weeks ago was ~3 so Cr is now even better - close monitoring of renal function as noted above - will repeat BMP in AM    Diabetes mellitus with diabetic nephropathy, with long-term current use of insulin (HCC) in obese pt - keep on Insulin novolog 70/30 BID per home medical regimen  - also keep on SSI     COPD with acute exacerbation (HCC) - wheezing controlled with BD's scheduled and as needed - avoid steroids     Hypokalemia - supplement and repeat BMP in AM    Peripheral autonomic neuropathy  due to diabetes mellitus ([place]) - continue neurontin as per home medical regimen      Hypomagnesemia - supplemented and WNL this AM - will repeat again in AM    Essential hypertension, benign - continue Hydralazine, Clonidine, Metoprolol     Morbid obesity (HCC) - pt meets criteria for morbid obesity with BMI > 35 in the setting of DM, HTN, HLD - Body mass index is 38.95 kg/(m^2).    Anemia of chronic kidney failure - no signs of bleeding - CBC in AM  DVT prophylaxis - Heparin SQ  Code Status: DNR Family Communication:  plan of care discussed with the patient Disposition Plan: Home when stable.   IV access:  Peripheral IV  Procedures and diagnostic studies:    Dg Chest 2 View 09/10/2015  Mild vascular congestion without infiltrate.   Medical Consultants:  None   Other Consultants:  PT  IAnti-Infectives:   Clindamycin 10/11 -->  Faye Ramsay, MD  [facility] Pager [phone]  If 7PM-7AM, please contact night-coverage [email or web address] Password [email or web address] 09/11/2015, 12:18 PM   HPI/Subjective: No events overnight.   Objective: Filed Vitals:   09/11/15 0229 09/11/15 0505 09/11/15 0802 09/11/15 0909  BP:  93/51  150/70  Pulse:  67    Temp:  98.2 F (36.8 C)    TempSrc:  Oral    Resp:  20    Height:  Weight: 102.967 kg (227 lb)     SpO2:  100% 95%     Intake/Output Summary (Last 24 hours) at 09/11/15 1218 Last data filed at 09/11/15 0900  Gross per 24 hour  Intake    480 ml  Output   1125 ml  Net   -645 ml    Exam:   General:  Pt is alert, follows commands appropriately, not in acute distress  Cardiovascular: Regular rate and rhythm, no rubs, no gallops  Respiratory: Clear to auscultation bilaterally, crackles at bases with no wheezing   Abdomen: Soft, non tender, non distended, bowel sounds present, no guarding  Extremities: +2 bilateral LE edema, RLE erythema with TTP   Neuro: Grossly nonfocal  Data Reviewed: Basic Metabolic  Panel:  Recent Labs Lab 09/10/15 1922 09/11/15 0400  NA 137 142  K 2.9* 3.4*  CL 103 104  CO2 26 31  GLUCOSE 277* 138*  BUN 24* 22*  CREATININE 2.11* 2.09*  CALCIUM 8.9 8.7*  MG 1.4* 1.9   Liver Function Tests:  Recent Labs Lab 09/10/15 1922 09/11/15 0400  AST 19 14*  ALT 10* 8*  ALKPHOS 96 84  BILITOT 0.6 0.3  PROT 7.6 6.7  ALBUMIN 3.5 3.0*   CBC:  Recent Labs Lab 09/10/15 1922  WBC 7.1  HGB 10.8*  HCT 34.1*  MCV 82.4  PLT 240   Cardiac Enzymes:  Recent Labs Lab 09/11/15 0400 09/11/15 0855  TROPONINI <0.03 <0.03   CBG:  Recent Labs Lab 09/11/15 0124 09/11/15 0803 09/11/15 1120  GLUCAP 155* 173* 107*   Scheduled Meds: . aspirin EC  81 mg Oral Daily  . budesonide  0.25 mg Nebulization BID  . cilostazol  100 mg Oral Daily  . clindamycin  300 mg Oral 4 times per day  . cloNIDine  0.1 mg Oral Daily  . cycloSPORINE  1 drop Both Eyes BID  . DULoxetine  30 mg Oral Daily  . gabapentin  100 mg Oral TID  . heparin  5,000 Units Subcutaneous 3 times per day  . hydrALAZINE  50 mg Oral BID  . insulin aspart  0-15 Units Subcutaneous TID WC  . insulin aspart  0-5 Units Subcutaneous QHS  . insulin aspart protamine- aspart  25 Units Subcutaneous BID WC  . ipratropium-albuterol  3 mL Nebulization TID  . latanoprost  1 drop Both Eyes QHS  . lubiprostone  24 mcg Oral BID WC  . metoprolol succinate  25 mg Oral Daily  . pantoprazole  40 mg Oral Daily  . sertraline  50 mg Oral Daily  . simvastatin  20 mg Oral Daily  . sucralfate  1 g Oral TID WC & HS  . timolol  1 drop Both Eyes BID   Continuous Infusions:

## 2015-09-12 ENCOUNTER — Ambulatory Visit: Payer: Self-pay | Admitting: Nurse Practitioner

## 2015-09-12 DIAGNOSIS — E1143 Type 2 diabetes mellitus with diabetic autonomic (poly)neuropathy: Secondary | ICD-10-CM

## 2015-09-12 DIAGNOSIS — I5033 Acute on chronic diastolic (congestive) heart failure: Secondary | ICD-10-CM | POA: Diagnosis not present

## 2015-09-12 DIAGNOSIS — N184 Chronic kidney disease, stage 4 (severe): Secondary | ICD-10-CM | POA: Diagnosis not present

## 2015-09-12 DIAGNOSIS — J9601 Acute respiratory failure with hypoxia: Secondary | ICD-10-CM | POA: Diagnosis not present

## 2015-09-12 DIAGNOSIS — L03115 Cellulitis of right lower limb: Secondary | ICD-10-CM | POA: Diagnosis not present

## 2015-09-12 LAB — BASIC METABOLIC PANEL
ANION GAP: 9 (ref 5–15)
BUN: 24 mg/dL — AB (ref 6–20)
CALCIUM: 8.8 mg/dL — AB (ref 8.9–10.3)
CO2: 31 mmol/L (ref 22–32)
Chloride: 102 mmol/L (ref 101–111)
Creatinine, Ser: 2.06 mg/dL — ABNORMAL HIGH (ref 0.44–1.00)
GFR calc Af Amer: 26 mL/min — ABNORMAL LOW (ref >60)
GFR, EST NON AFRICAN AMERICAN: 22 mL/min — AB (ref >60)
GLUCOSE: 56 mg/dL — AB (ref 65–99)
Potassium: 3.4 mmol/L — ABNORMAL LOW (ref 3.5–5.1)
SODIUM: 142 mmol/L (ref 135–145)

## 2015-09-12 LAB — CBC
HCT: 31.7 % — ABNORMAL LOW (ref 36.0–46.0)
Hemoglobin: 10 g/dL — ABNORMAL LOW (ref 12.0–15.0)
MCH: 26.3 pg (ref 26.0–34.0)
MCHC: 31.5 g/dL (ref 30.0–36.0)
MCV: 83.4 fL (ref 78.0–100.0)
PLATELETS: 272 10*3/uL (ref 150–400)
RBC: 3.8 MIL/uL — ABNORMAL LOW (ref 3.87–5.11)
RDW: 15.7 % — AB (ref 11.5–15.5)
WBC: 6.2 10*3/uL (ref 4.0–10.5)

## 2015-09-12 LAB — GLUCOSE, CAPILLARY
GLUCOSE-CAPILLARY: 180 mg/dL — AB (ref 65–99)
GLUCOSE-CAPILLARY: 71 mg/dL (ref 65–99)
Glucose-Capillary: 70 mg/dL (ref 65–99)
Glucose-Capillary: 143 mg/dL — ABNORMAL HIGH (ref 65–99)

## 2015-09-12 LAB — MAGNESIUM: Magnesium: 1.7 mg/dL (ref 1.7–2.4)

## 2015-09-12 MED ORDER — POTASSIUM CHLORIDE CRYS ER 20 MEQ PO TBCR
40.0000 meq | EXTENDED_RELEASE_TABLET | Freq: Once | ORAL | Status: AC
  Administered 2015-09-12: 40 meq via ORAL
  Filled 2015-09-12: qty 2

## 2015-09-12 NOTE — Progress Notes (Signed)
Patient ID: Michelle Buck, female   DOB: [birth year], 79 y.o.   MRN: [id number]  [facility] PROGRESS NOTE  Michelle Buck [id number] DOB: 05-Mar-1936 DOA: 09/10/2015 PCP: [name], NP   Brief narrative:    79 y.o. female with known DM with complications of neuropathy, hypertension, obesity, COPD, CKD stage III - IV, chronic diastolic dysfunction (last 2 D ECHO 08/2015 with stable EF), presented to [facility] ED with main concern of 1 week duration of progressive dyspnea that initially started with exertion and has progressed to dyspnea at rest in the past 24 - 48 hours, associated with worsening 2-3 pillow orthopnea and non productive cough, LE swelling, weight gain but unknown amount.   Assessment/Plan:    Principal Problem:   Acute respiratory failure with hypoxia (HCC) - this is multifactorial and secondary to acute COPD with acute on chronic diastolic CHF - pt takes Lasix 40 mg PO QD, continue with Lasix 40 mg IV for now for better diuresis - also keep on BD's scheduled and as needed - avoid steroids due to DM and wheezing is well controlled on BD's   Active Problems:   Acute on chronic diastolic congestive heart failure (HCC) - treat with lasix 40 mg IV - monitor daily weights, strict I/O - weight trend: 102.9 kg --> 103 kg - monitor renal function closely     Cellulitis, RLE - continue Clindamycin day #3 - RLE looks better this AM but still with some swelling and TTP  - keep extremity elevated     Chronic kidney disease (CKD), stage IV (severe) (HCC) - last Cr about 3 weeks ago was ~3 so Cr is now even better - close monitoring of renal function as noted above - will repeat BMP in AM    Diabetes mellitus with diabetic nephropathy, with long-term current use of insulin (HCC) in obese pt - keep on Insulin novolog 70/30 BID per home medical regimen  - also keep on SSI     COPD with acute exacerbation (HCC) - wheezing controlled with BD's scheduled and as needed -  avoid steroids     Hypokalemia - still mildly low  - continue to supplement and repeat BMP in AM    Peripheral autonomic neuropathy due to diabetes mellitus (HCC) - continue neurontin as per home medical regimen      Hypomagnesemia - supplemented and WNL this AM - will repeat again in AM    Essential hypertension, benign - continue Hydralazine, Clonidine, Metoprolol     Morbid obesity (HCC) - pt meets criteria for morbid obesity with BMI > 35 in the setting of DM, HTN, HLD - Body mass index is 39.27 kg/(m^2).    Anemia of chronic kidney failure - no signs of bleeding - CBC in AM  DVT prophylaxis - Heparin SQ  Code Status: DNR Family Communication:  plan of care discussed with the patient Disposition Plan: Home when stable.   IV access:  Peripheral IV  Procedures and diagnostic studies:    Dg Chest 2 View 09/10/2015  Mild vascular congestion without infiltrate.   Medical Consultants:  None   Other Consultants:  PT  IAnti-Infectives:   Clindamycin 10/11 -->  Faye Ramsay, MD  [facility] Pager [phone]  If 7PM-7AM, please contact night-coverage [email or web address] Password [facility] 09/12/2015, 6:48 AM   HPI/Subjective: No events overnight.   Objective: Filed Vitals:   09/11/15 0909 09/11/15 1924 09/11/15 2213 09/12/15 0523  BP: 150/70  146/77 131/66  Pulse:   74 80  Temp:  98.1 F (36.7 C) 98 F (36.7 C)  TempSrc:   Oral Oral  Resp:   20 16  Height:      Weight:    103.828 kg (228 lb 14.4 oz)  SpO2:  97% 95% 93%    Intake/Output Summary (Last 24 hours) at 09/12/15 0648 Last data filed at 09/12/15 0529  Gross per 24 hour  Intake    960 ml  Output    400 ml  Net    560 ml    Exam:   General:  Pt is alert, follows commands appropriately, not in acute distress  Cardiovascular: Regular rate and rhythm, no rubs, no gallops  Respiratory: Clear to auscultation bilaterally, crackles at bases with no wheezing   Abdomen: Soft, non tender, non  distended, bowel sounds present, no guarding  Extremities: +2 bilateral LE edema, RLE erythema with TTP   Neuro: Grossly nonfocal  Data Reviewed: Basic Metabolic Panel:  Recent Labs Lab 09/10/15 1922 09/11/15 0400 09/12/15 0447  NA 137 142 142  K 2.9* 3.4* 3.4*  CL 103 104 102  CO2 26 31 31   GLUCOSE 277* 138* 56*  BUN 24* 22* 24*  CREATININE 2.11* 2.09* 2.06*  CALCIUM 8.9 8.7* 8.8*  MG 1.4* 1.9 1.7   Liver Function Tests:  Recent Labs Lab 09/10/15 1922 09/11/15 0400  AST 19 14*  ALT 10* 8*  ALKPHOS 96 84  BILITOT 0.6 0.3  PROT 7.6 6.7  ALBUMIN 3.5 3.0*   CBC:  Recent Labs Lab 09/10/15 1922 09/12/15 0447  WBC 7.1 6.2  HGB 10.8* 10.0*  HCT 34.1* 31.7*  MCV 82.4 83.4  PLT 240 272   Cardiac Enzymes:  Recent Labs Lab 09/11/15 0400 09/11/15 0855 09/11/15 1730  TROPONINI <0.03 <0.03 <0.03   CBG:  Recent Labs Lab 09/11/15 1619 09/11/15 1643 09/11/15 2208 09/11/15 2246 09/11/15 2317  GLUCAP 66 70 54* 66 101*   Scheduled Meds: . aspirin EC  81 mg Oral Daily  . budesonide  0.25 mg Nebulization BID  . cilostazol  100 mg Oral Daily  . clindamycin  300 mg Oral 4 times per day  . cloNIDine  0.1 mg Oral Daily  . cycloSPORINE  1 drop Both Eyes BID  . DULoxetine  30 mg Oral Daily  . gabapentin  100 mg Oral TID  . heparin  5,000 Units Subcutaneous 3 times per day  . hydrALAZINE  50 mg Oral BID  . insulin aspart  0-15 Units Subcutaneous TID WC  . insulin aspart  0-5 Units Subcutaneous QHS  . insulin aspart protamine- aspart  25 Units Subcutaneous BID WC  . ipratropium-albuterol  3 mL Nebulization TID  . latanoprost  1 drop Both Eyes QHS  . lubiprostone  24 mcg Oral BID WC  . metoprolol succinate  25 mg Oral Daily  . pantoprazole  40 mg Oral Daily  . sertraline  50 mg Oral Daily  . simvastatin  20 mg Oral Daily  . sucralfate  1 g Oral TID WC & HS  . timolol  1 drop Both Eyes BID   Continuous Infusions:

## 2015-09-12 NOTE — Evaluation (Signed)
Physical Therapy Evaluation [name] MRN: [id number] DOB: 1936/08/05 Today's Date: 09/12/2015   History of Present Illness  79 y.o. female with a past medical history significant for COPD, CHF, diabetes mellitus, HTN, obesity, and hyperlipidemia and admitted on 10/11 with COPD exacerbation/acute on chronic diastolic heart failure  Clinical Impression  Pt admitted with above diagnosis. Pt currently with functional limitations due to the deficits listed below (see PT Problem List).  Pt will benefit from skilled PT to increase their independence and safety with mobility to allow discharge to the venue listed below.   Pt familiar to this PT from previous admission and reports doing well with HHPT upon d/c.  Will continue to work with pt during acute stay and recommend continuing f/u with HHPT upon d/c.     Follow Up Recommendations Home health PT    Equipment Recommendations  None recommended by PT    Recommendations for Other Services       Precautions / Restrictions Precautions Precautions: Fall Precaution Comments: monitor sats      Mobility  Bed Mobility Overal bed mobility: Modified Independent                Transfers Overall transfer level: Needs assistance Equipment used: Rolling walker (2 wheeled) Transfers: Sit to/from Stand Sit to Stand: Min guard         General transfer comment: verbal cues for hand placement  Ambulation/Gait Ambulation/Gait assistance: Min guard Ambulation Distance (Feet): 120 Feet Assistive device: Rolling walker (2 wheeled) Gait Pattern/deviations: Step-through pattern;Trunk flexed;Decreased stride length     General Gait Details: verbal cues for RW distance and posture, SpO2 88-96% on room air, 93% on room air upon return to room, left off O2 Arnold and RN aware  Stairs            Wheelchair Mobility    Modified Rankin (Stroke Patients Only)       Balance                                             Pertinent Vitals/Pain Pain Assessment: 0-10 Pain Score: 8  Pain Location: chest tightness Pain Intervention(s): Monitored during session;Repositioned    Home Living Family/patient expects to be discharged to:: Private residence Living Arrangements: Alone   Type of Home: Apartment       Home Layout: One level Home Equipment: Environmental consultant - 4 wheels      Prior Function Level of Independence: Independent with assistive device(s)               Hand Dominance        Extremity/Trunk Assessment               Lower Extremity Assessment: Overall WFL for tasks assessed         Communication   Communication: No difficulties  Cognition Arousal/Alertness: Awake/alert Behavior During Therapy: WFL for tasks assessed/performed Overall Cognitive Status: Within Functional Limits for tasks assessed                      General Comments      Exercises        Assessment/Plan    PT Assessment Patient needs continued PT services  PT Diagnosis Difficulty walking   PT Problem List Decreased strength;Decreased activity tolerance;Decreased balance;Decreased mobility;Decreased knowledge of use of DME;Decreased safety awareness;Cardiopulmonary status limiting activity  PT Treatment Interventions DME instruction;Gait training;Patient/family education;Functional mobility training;Therapeutic activities;Therapeutic exercise   PT Goals (Current goals can be found in the Care Plan section) Acute Rehab PT Goals PT Goal Formulation: With patient Time For Goal Achievement: 09/19/15 Potential to Achieve Goals: Good    Frequency Min 3X/week   Barriers to discharge        Co-evaluation               End of Session Equipment Utilized During Treatment: Oxygen Activity Tolerance: Patient tolerated treatment well Patient left: with call bell/phone within reach;in chair Nurse Communication: Mobility status    Functional Assessment Tool Used:  clinical judgement Functional Limitation: Mobility: Walking and moving around Mobility: Walking and Moving Around Current Status [id number]): At least 1 percent but less than 20 percent impaired, limited or restricted Mobility: Walking and Moving Around Goal Status [phone]): 0 percent impaired, limited or restricted    Time: 0916-0932 PT Time Calculation (min) (ACUTE ONLY): 16 min   Charges:   PT Evaluation $Initial PT Evaluation Tier I: 1 Procedure     PT G Codes:   PT G-Codes **NOT FOR INPATIENT CLASS** Functional Assessment Tool Used: clinical judgement Functional Limitation: Mobility: Walking and moving around Mobility: Walking and Moving Around Current Status [id number]): At least 1 percent but less than 20 percent impaired, limited or restricted Mobility: Walking and Moving Around Goal Status [phone]): 0 percent impaired, limited or restricted    Sofi Bryars,KATHrine E 09/12/2015, 1:13 PM Carmelia Bake, PT, DPT 09/12/2015 Pager: [phone]

## 2015-09-12 NOTE — Progress Notes (Signed)
Hypoglycemic Event  CBG: 54  Treatment: juice  Symptoms: none  Follow-up CBG: Time:2246 CBG Result:66  Possible Reasons for Event: patient states this is usually what happens at night  Comments/MD notified:no    Arline Asp, Davy Pique A

## 2015-09-12 NOTE — Progress Notes (Signed)
Hypoglycemic Event  CBG: 66  Treatment: juice  Symptoms: none  Follow-up CBG: Time:2317 CBG Result:101  Possible Reasons for Event: patient states this is usual for her at this time  Comments/MD notified:no    Michelle Buck, Davy Pique A

## 2015-09-12 NOTE — Progress Notes (Signed)
Inpatient Diabetes Program Recommendations  AACE/ADA: New Consensus Statement on Inpatient Glycemic Control (2015)  Target Ranges:  Prepandial:   less than 140 mg/dL      Peak postprandial:   less than 180 mg/dL (1-2 hours)      Critically ill patients:  140 - 180 mg/dL   Review of Glycemic Control  Diabetes history: DM2 Outpatient Diabetes medications: 70/30 40 in am and 20 in pm Current orders for Inpatient glycemic control: 70/30 25 units bid, Novolog moderate tidwc and hs  Inpatient Diabetes Program Recommendations:  Decrease 70/30 to 25 units in am and 20 units in pm  Will continue to follow. Thank you. Lorenda Peck, RD, LDN, CDE Inpatient Diabetes Coordinator [phone]

## 2015-09-13 ENCOUNTER — Ambulatory Visit: Payer: Self-pay | Admitting: Cardiovascular Disease

## 2015-09-13 DIAGNOSIS — I5033 Acute on chronic diastolic (congestive) heart failure: Secondary | ICD-10-CM | POA: Diagnosis not present

## 2015-09-13 DIAGNOSIS — N189 Chronic kidney disease, unspecified: Secondary | ICD-10-CM | POA: Insufficient documentation

## 2015-09-13 DIAGNOSIS — N183 Chronic kidney disease, stage 3 (moderate): Secondary | ICD-10-CM | POA: Diagnosis not present

## 2015-09-13 DIAGNOSIS — L03115 Cellulitis of right lower limb: Secondary | ICD-10-CM | POA: Diagnosis not present

## 2015-09-13 DIAGNOSIS — J9601 Acute respiratory failure with hypoxia: Secondary | ICD-10-CM | POA: Diagnosis not present

## 2015-09-13 LAB — CBC
HEMATOCRIT: 30.5 % — AB (ref 36.0–46.0)
Hemoglobin: 9.6 g/dL — ABNORMAL LOW (ref 12.0–15.0)
MCH: 25.9 pg — AB (ref 26.0–34.0)
MCHC: 31.5 g/dL (ref 30.0–36.0)
MCV: 82.4 fL (ref 78.0–100.0)
PLATELETS: 283 10*3/uL (ref 150–400)
RBC: 3.7 MIL/uL — ABNORMAL LOW (ref 3.87–5.11)
RDW: 15.4 % (ref 11.5–15.5)
WBC: 6.7 10*3/uL (ref 4.0–10.5)

## 2015-09-13 LAB — BASIC METABOLIC PANEL
ANION GAP: 8 (ref 5–15)
BUN: 24 mg/dL — AB (ref 6–20)
CHLORIDE: 100 mmol/L — AB (ref 101–111)
CO2: 30 mmol/L (ref 22–32)
Calcium: 8.8 mg/dL — ABNORMAL LOW (ref 8.9–10.3)
Creatinine, Ser: 2.03 mg/dL — ABNORMAL HIGH (ref 0.44–1.00)
GFR calc Af Amer: 26 mL/min — ABNORMAL LOW (ref >60)
GFR, EST NON AFRICAN AMERICAN: 22 mL/min — AB (ref >60)
Glucose, Bld: 124 mg/dL — ABNORMAL HIGH (ref 65–99)
POTASSIUM: 3.3 mmol/L — AB (ref 3.5–5.1)
SODIUM: 138 mmol/L (ref 135–145)

## 2015-09-13 LAB — GLUCOSE, CAPILLARY: Glucose-Capillary: 111 mg/dL — ABNORMAL HIGH (ref 65–99)

## 2015-09-13 LAB — MAGNESIUM: MAGNESIUM: 1.6 mg/dL — AB (ref 1.7–2.4)

## 2015-09-13 MED ORDER — FUROSEMIDE 40 MG PO TABS: 40.0000 mg | ORAL_TABLET | Freq: Two times a day (BID) | ORAL | Status: DC

## 2015-09-13 MED ORDER — POTASSIUM CHLORIDE CRYS ER 20 MEQ PO TBCR: 40.0000 meq | EXTENDED_RELEASE_TABLET | Freq: Once | ORAL | Status: DC

## 2015-09-13 MED ORDER — INSULIN NPH ISOPHANE & REGULAR (70-30) 100 UNIT/ML ~~LOC~~ SUSP: SUBCUTANEOUS | Status: DC

## 2015-09-13 MED ORDER — CLINDAMYCIN HCL 300 MG PO CAPS: 300.0000 mg | ORAL_CAPSULE | Freq: Three times a day (TID) | ORAL | Status: DC

## 2015-09-13 NOTE — Discharge Summary (Signed)
Physician Discharge Summary  Michelle Buck [id number] DOB: [birth year] DOA: 09/10/2015  PCP: [name], NP  Admit date: 09/10/2015 Discharge date: 09/13/2015  Recommendations for Outpatient Follow-up:  1. Pt will need to follow up with PCP in 2-3 weeks post discharge 2. Please obtain BMP to evaluate electrolytes and kidney function, K and Mg level 3. Please note that both electrolytes have been supplemented prior to discharge 4. Please also note that pt asked to leave today as she had to meet with family (? Cousin) 5. Please also check CBC to evaluate Hg and Hct levels 6. Please note that we have increased the dose of Lasix to 40 mg BID PO 7. Weight on discharge was 103 kg, pt advised to monitor her weight daily and to report to her PCP any weight gain > 3 lbs  Discharge Diagnoses:  Principal Problem:   Acute respiratory failure with hypoxia (HCC) Active Problems:   Acute on chronic diastolic congestive heart failure (HCC)   Cellulitis   Chronic kidney disease (CKD), stage IV (severe) (HCC)   Diabetes mellitus with diabetic nephropathy, with long-term current use of insulin (HCC)   COPD with acute exacerbation (HCC)   Hypokalemia   Diabetes mellitus type 2 in obese (HCC)   Peripheral autonomic neuropathy due to diabetes mellitus ([place])   Hypomagnesemia   Essential hypertension, benign   Morbid obesity ([place])   Anemia of chronic kidney failure  Discharge Condition: Stable  Diet recommendation: Heart healthy diet discussed in details    Brief narrative:    79 y.o. female with known DM with complications of neuropathy, hypertension, obesity, COPD, CKD stage III - IV, chronic diastolic dysfunction (last 2 D ECHO 08/2015 with stable EF), presented to [facility] ED with main concern of 1 week duration of progressive dyspnea that initially started with exertion and has progressed to dyspnea at rest in the past 24 - 48 hours, associated with worsening 2-3 pillow orthopnea and non  productive cough, LE swelling, weight gain but unknown amount.   Assessment/Plan:    Principal Problem:  Acute respiratory failure with hypoxia (HCC) - this is multifactorial and secondary to acute COPD with acute on chronic diastolic CHF - pt takes Lasix 40 mg PO QD, continue with Lasix 40 mg PO BID upon discharge   Active Problems:  Acute on chronic diastolic congestive heart failure (HCC) - treat with lasix 40 mg PO BID upon discharge  - monitor daily weights, pt advised  - weight trend: 102.9 kg --> 103 kg   Cellulitis, RLE - continue Clindamycin to complete therapy  - RLE looks better this AM with much less erythema  - keep extremity elevated, pt made aware    Chronic kidney disease (CKD), stage IV (severe) ([place]) - last Cr about 3 weeks ago was ~3 so Cr is now even better - close monitoring of renal function in an outpatient setting    Diabetes mellitus with diabetic nephropathy, with long-term current use of insulin ([place]) in obese pt - continue with home medical regimen    COPD with acute exacerbation ([place]) - wheezing controlled with BD's scheduled and as needed - avoid steroids    Hypokalemia - still mildly low  - supplemented prior to discharge    Peripheral autonomic neuropathy due to diabetes mellitus ([place]) - continue neurontin as per home medical regimen    Hypomagnesemia - supplemented prior to discharge and will need to be repeated upon follow up with PCP    Essential hypertension, benign -  continue Hydralazine, Clonidine, Metoprolol    Morbid obesity (HCC) - pt meets criteria for morbid obesity with BMI > 35 in the setting of DM, HTN, HLD - Body mass index is 39.27 kg/(m^2).   Anemia of chronic kidney failure - no signs of bleeding - no transfusion required during this hospitalization   Code Status: DNR Family Communication: plan of care discussed with the patient Disposition Plan: Home  IV access:  Peripheral  IV  Procedures and diagnostic studies:   Dg Chest 2 View 09/10/2015 Mild vascular congestion without infiltrate.   Medical Consultants:  None   Other Consultants:  PT  IAnti-Infectives:   Clindamycin 10/11 -->     Discharge Exam: Filed Vitals:   09/13/15 0554  BP: 119/66  Pulse: 79  Temp: 98.5 F (36.9 C)  Resp: 20   Filed Vitals:   09/12/15 2038 09/12/15 2157 09/13/15 0554 09/13/15 0745  BP:  136/82 119/66   Pulse:  84 79   Temp:  98.1 F (36.7 C) 98.5 F (36.9 C)   TempSrc:  Oral Oral   Resp:  20 20   Height:      Weight:   103.874 kg (229 lb)   SpO2: 95% 99% 95% 97%    General: Pt is alert, follows commands appropriately, not in acute distress Cardiovascular: Regular rate and rhythm, no rubs, no gallops Respiratory: Clear to auscultation bilaterally, no wheezing Abdominal: Soft, non tender, non distended, bowel sounds +, no guarding Extremities: trace bilateral pitting edema, less erythema in RLE  Discharge Instructions  Discharge Instructions    Diet - low sodium heart healthy    Complete by:  As directed      Increase activity slowly    Complete by:  As directed             Medication List    STOP taking these medications        torsemide 20 MG tablet  Commonly known as:  DEMADEX      TAKE these medications        albuterol 108 (90 BASE) MCG/ACT inhaler  Commonly known as:  PROVENTIL HFA;VENTOLIN HFA  Inhale 1 puff into the lungs every 6 (six) hours as needed for wheezing or shortness of breath.     aspirin EC 81 MG tablet  Take 81 mg by mouth daily.     budesonide 0.25 MG/2ML nebulizer solution  Commonly known as:  PULMICORT  Take 2 mLs (0.25 mg total) by nebulization 2 (two) times daily.     cilostazol 100 MG tablet  Commonly known as:  PLETAL  TAKE 1 TABLET BY MOUTH EVERY DAY     clindamycin 300 MG capsule  Commonly known as:  CLEOCIN  Take 1 capsule (300 mg total) by mouth 3 (three) times daily.     cloNIDine  0.1 MG tablet  Commonly known as:  CATAPRES  TAKE 1 TABLET BY MOUTH EVERY DAY     cycloSPORINE 0.05 % ophthalmic emulsion  Commonly known as:  RESTASIS  Place 1 drop into both eyes 2 (two) times daily. One drop once a day bilaterally for dry eyes     diclofenac sodium 1 % Gel  Commonly known as:  VOLTAREN  Apply 4 g topically 2 (two) times daily as needed. For pain     DULoxetine 30 MG capsule  Commonly known as:  CYMBALTA  TAKE 1 CAPSULE BY MOUTH EVERY DAY TO HELP ANXIETY AND PAINS     EASY TOUCH INSULIN  SYRINGE 31G X 5/16" 0.5 ML Misc  Generic drug:  Insulin Syringe-Needle U-100  USE UP TO 2 TIMES A DAY AS DIRECTED     esomeprazole 40 MG capsule  Commonly known as:  NEXIUM  Take 1 capsule (40 mg total) by mouth daily.     furosemide 40 MG tablet  Commonly known as:  LASIX  Take 1 tablet (40 mg total) by mouth 2 (two) times daily.     gabapentin 100 MG capsule  Commonly known as:  NEURONTIN  Take 2 capsules (200 mg total) by mouth 3 (three) times daily.     guaifenesin 100 MG/5ML syrup  Commonly known as:  ROBITUSSIN  Take 5-10 mLs (100-200 mg total) by mouth every 4 (four) hours as needed for cough.     hydrALAZINE 50 MG tablet  Commonly known as:  APRESOLINE  TAKE 1 TABLET BY MOUTH TWICE DAILY     HYDROcodone-acetaminophen 10-325 MG tablet  Commonly known as:  NORCO  Take 1 tablet by mouth every 8 (eight) hours as needed for moderate pain.     insulin NPH-regular Human (70-30) 100 UNIT/ML injection  Commonly known as:  HUMULIN 70/30  Inject 25 units subcutaneously with breakfast and 20 units with dinner to control blood sugar.     ipratropium-albuterol 0.5-2.5 (3) MG/3ML Soln  Commonly known as:  DUONEB  Take 3 mLs by nebulization every 4 (four) hours as needed.     lubiprostone 24 MCG capsule  Commonly known as:  AMITIZA  Take 1 capsule (24 mcg total) by mouth 2 (two) times daily with a meal.     metoprolol succinate 25 MG 24 hr tablet  Commonly known as:   TOPROL-XL  TAKE 1 TABLET BY MOUTH EVERY DAY WITH OR IMMEDIATELY FOLLOWING A MEAL FOR BLOOD PRESSURE     potassium chloride SA 20 MEQ tablet  Commonly known as:  K-DUR,KLOR-CON  TAKE 1 TABLET BY MOUTH EVERY DAY     sertraline 50 MG tablet  Commonly known as:  ZOLOFT  TAKE 1 TABLET BY MOUTH EVERY DAY     simvastatin 20 MG tablet  Commonly known as:  ZOCOR  TAKE 1 TABLET BY MOUTH EVERY DAY     sucralfate 1 G tablet  Commonly known as:  CARAFATE  TAKE 1 TABLET BY MOUTH TWICE DAILY ON EMPTY STOMACH     timolol 0.5 % ophthalmic solution  Commonly known as:  TIMOPTIC  Place 1 drop into both eyes 2 (two) times daily. One drop in bilaterally twice a day as needed     Travoprost (BAK Free) 0.004 % Soln ophthalmic solution  Commonly known as:  TRAVATAN  Place 1 drop into both eyes at bedtime. One drop bilaterally twice a day           Follow-up Information    Follow up with Dewaine Oats, JESSICA K, NP.   Specialty:  Geriatric Medicine   Contact information:   [address] [place] [address] [id number]       Call Faye Ramsay, MD.   Specialty:  Internal Medicine   Why:  As needed   Contact information:   [address] [place] [address] [phone]        The results of significant diagnostics from this hospitalization (including imaging, microbiology, ancillary and laboratory) are listed below for reference.     Microbiology: No results found for this or any previous visit (from the past 240 hour(s)).   Labs: Basic  Metabolic Panel:  Recent Labs Lab 09/10/15 1922 09/11/15 0400 09/12/15 0447 09/13/15 0511  NA 137 142 142 138  K 2.9* 3.4* 3.4* 3.3*  CL 103 104 102 100*  CO2 26 31 31 30   GLUCOSE 277* 138* 56* 124*  BUN 24* 22* 24* 24*  CREATININE 2.11* 2.09* 2.06* 2.03*  CALCIUM 8.9 8.7* 8.8* 8.8*  MG 1.4* 1.9 1.7 1.6*   Liver Function Tests:  Recent Labs Lab 09/10/15 1922 09/11/15 0400  AST 19 14*  ALT 10* 8*   ALKPHOS 96 84  BILITOT 0.6 0.3  PROT 7.6 6.7  ALBUMIN 3.5 3.0*    CBC:  Recent Labs Lab 09/10/15 1922 09/12/15 0447 09/13/15 0511  WBC 7.1 6.2 6.7  HGB 10.8* 10.0* 9.6*  HCT 34.1* 31.7* 30.5*  MCV 82.4 83.4 82.4  PLT 240 272 283   Cardiac Enzymes:  Recent Labs Lab 09/11/15 0400 09/11/15 0855 09/11/15 1730  TROPONINI <0.03 <0.03 <0.03   BNP: BNP (last 3 results)  Recent Labs  08/12/15 0454 08/21/15 1743 09/10/15 1922  BNP 149.2* 24.6 29.8    ProBNP (last 3 results)  Recent Labs  10/15/14 1536  PROBNP 110.2    CBG:  Recent Labs Lab 09/12/15 0742 09/12/15 1208 09/12/15 1639 09/12/15 2155 09/13/15 0723  GLUCAP 70 180* 143* 71 111*     SIGNED: Time coordinating discharge: 30 minutes  MAGICK-Gyanna Jarema, MD  [facility] 09/13/2015, 8:56 AM Pager [phone]  If 7PM-7AM, please contact night-coverage [email or web address] Password [email or web address]

## 2015-09-13 NOTE — Discharge Instructions (Signed)

## 2015-09-18 ENCOUNTER — Telehealth: Payer: Self-pay | Admitting: Internal Medicine

## 2015-09-18 NOTE — Telephone Encounter (Signed)
Michelle Buck from [facility] is calling regarding a form they received from Dr. Doyle Askew. Please follow up with Michelle Buck about the details. Thank you!

## 2015-09-19 DIAGNOSIS — I129 Hypertensive chronic kidney disease with stage 1 through stage 4 chronic kidney disease, or unspecified chronic kidney disease: Secondary | ICD-10-CM | POA: Diagnosis not present

## 2015-09-19 DIAGNOSIS — N183 Chronic kidney disease, stage 3 (moderate): Secondary | ICD-10-CM | POA: Diagnosis not present

## 2015-09-19 DIAGNOSIS — N140 Analgesic nephropathy: Secondary | ICD-10-CM | POA: Diagnosis not present

## 2015-10-01 ENCOUNTER — Other Ambulatory Visit: Payer: Self-pay | Admitting: Nurse Practitioner

## 2015-10-03 ENCOUNTER — Ambulatory Visit: Payer: Medicare Other | Admitting: Nurse Practitioner

## 2015-10-09 ENCOUNTER — Telehealth: Payer: Self-pay | Admitting: *Deleted

## 2015-10-09 ENCOUNTER — Ambulatory Visit: Payer: Medicare Other | Admitting: Podiatry

## 2015-10-09 NOTE — Telephone Encounter (Signed)
Pt states she wants to talk about her appt.  Pt states she went to [facility] and they said the bone was deteriorating in her leg and she couldn't get around, so she wouldn't be coming back to our office, and she just wanted [place] to know.  I told pt if she couldn't follow up with our office please follow up with her primary doctor.  Pt agreed.

## 2015-10-10 ENCOUNTER — Other Ambulatory Visit: Payer: Self-pay | Admitting: *Deleted

## 2015-10-10 MED ORDER — HYDROCODONE-ACETAMINOPHEN 10-325 MG PO TABS: 1.0000 | ORAL_TABLET | Freq: Three times a day (TID) | ORAL | Status: DC | PRN

## 2015-10-10 NOTE — Telephone Encounter (Signed)
Patient requested and per Janett Billow last OV note she stated that she will start prescribing. Bottle brought in and last refilled 08/31/2015. Rx Aeronautical engineer.

## 2015-10-11 ENCOUNTER — Ambulatory Visit: Payer: Medicare Other | Admitting: Podiatry

## 2015-10-15 ENCOUNTER — Other Ambulatory Visit: Payer: Medicare Other

## 2015-10-15 DIAGNOSIS — E1122 Type 2 diabetes mellitus with diabetic chronic kidney disease: Secondary | ICD-10-CM

## 2015-10-16 LAB — COMPREHENSIVE METABOLIC PANEL
ALT: 10 IU/L (ref 0–32)
AST: 15 IU/L (ref 0–40)
Albumin/Globulin Ratio: 1.4 (ref 1.1–2.5)
Albumin: 4.1 g/dL (ref 3.5–4.8)
Alkaline Phosphatase: 104 IU/L (ref 39–117)
BUN/Creatinine Ratio: 13 (ref 11–26)
BUN: 27 mg/dL (ref 8–27)
Bilirubin Total: 0.2 mg/dL (ref 0.0–1.2)
CO2: 26 mmol/L (ref 18–29)
Calcium: 9.6 mg/dL (ref 8.7–10.3)
Chloride: 92 mmol/L — ABNORMAL LOW (ref 97–106)
Creatinine, Ser: 2.08 mg/dL — ABNORMAL HIGH (ref 0.57–1.00)
GFR calc Af Amer: 26 mL/min/{1.73_m2} — ABNORMAL LOW (ref >59)
GFR calc non Af Amer: 22 mL/min/{1.73_m2} — ABNORMAL LOW (ref >59)
Globulin, Total: 3 g/dL (ref 1.5–4.5)
Glucose: 148 mg/dL — ABNORMAL HIGH (ref 65–99)
Potassium: 3.6 mmol/L (ref 3.5–5.2)
Sodium: 140 mmol/L (ref 136–144)
Total Protein: 7.1 g/dL (ref 6.0–8.5)

## 2015-10-16 LAB — HEMOGLOBIN A1C
Est. average glucose Bld gHb Est-mCnc: 151 mg/dL
Hgb A1c MFr Bld: 6.9 % — ABNORMAL HIGH (ref 4.8–5.6)

## 2015-10-22 ENCOUNTER — Other Ambulatory Visit: Payer: Self-pay | Admitting: Nurse Practitioner

## 2015-10-22 ENCOUNTER — Other Ambulatory Visit: Payer: Medicare Other

## 2015-10-28 ENCOUNTER — Encounter: Payer: Self-pay | Admitting: Pharmacotherapy

## 2015-10-28 ENCOUNTER — Ambulatory Visit (INDEPENDENT_AMBULATORY_CARE_PROVIDER_SITE_OTHER): Payer: Medicare Other | Admitting: Pharmacotherapy

## 2015-10-28 VITALS — BP 138/80 | HR 66 | Temp 98.0°F | Resp 20 | Ht 64.0 in | Wt 228.0 lb

## 2015-10-28 DIAGNOSIS — E1122 Type 2 diabetes mellitus with diabetic chronic kidney disease: Secondary | ICD-10-CM | POA: Diagnosis not present

## 2015-10-28 DIAGNOSIS — N183 Chronic kidney disease, stage 3 (moderate): Secondary | ICD-10-CM | POA: Diagnosis not present

## 2015-10-28 DIAGNOSIS — Z794 Long term (current) use of insulin: Secondary | ICD-10-CM

## 2015-10-28 DIAGNOSIS — I10 Essential (primary) hypertension: Secondary | ICD-10-CM

## 2015-10-28 DIAGNOSIS — E1165 Type 2 diabetes mellitus with hyperglycemia: Secondary | ICD-10-CM | POA: Diagnosis not present

## 2015-10-28 MED ORDER — INSULIN NPH ISOPHANE & REGULAR (70-30) 100 UNIT/ML ~~LOC~~ SUSP: SUBCUTANEOUS | Status: DC

## 2015-10-28 NOTE — Patient Instructions (Signed)
Keep up the good work

## 2015-10-28 NOTE — Progress Notes (Signed)
  Subjective:    Michelle Buck is a 79 y.o.African American female who presents for follow-up of Type 2 diabetes mellitus.   A1C 6.9%  Having pain in both legs.   Some numbness in feet.  No sores on feet. Trace peripheral edema in lower extremities.  Forgot to bring blood glucose meter / logbook. She reports BG 120-130 range usually.  Trying to eat healthy choices. She does walk around the house.  She does chair exercise as she was instructed by physical therapist. Nocturia every hour Some blurry vision.  Not wearing her glasses regularly.  Review of Systems A comprehensive review of systems was negative except for: Eyes: positive for contacts/glasses Cardiovascular: positive for lower extremity edema Genitourinary: positive for nocturia    Objective:    BP 138/80 mmHg  Pulse 66  Temp(Src) 98 F (36.7 C) (Oral)  Resp 20  Ht 5' 4"$  (1.626 m)  Wt 228 lb (103.42 kg)  BMI 39.12 kg/m2  SpO2 95%  General:  alert, cooperative and no distress  Oropharynx: normal findings: lips normal without lesions and gums healthy   Eyes:  negative findings: lids and lashes normal and conjunctivae and sclerae normal   Ears:  external ears normal        Lung: clear to auscultation bilaterally  Heart:  regular rate and rhythm     Extremities: edema bilateral lower extremities  Skin: dry     Neuro: mental status, speech normal, alert and oriented x3 and unsteady gait - uses a walker   Lab Review GLUCOSE (mg/dL)  Date Value  10/15/2015 148*  08/27/2015 121*  08/20/2015 40*   GLUCOSE, BLD (mg/dL)  Date Value  09/13/2015 124*  09/12/2015 56*  09/11/2015 138*   CO2 (mmol/L)  Date Value  10/15/2015 26  09/13/2015 30  09/12/2015 31   BUN (mg/dL)  Date Value  10/15/2015 27  09/13/2015 24*  09/12/2015 24*  09/11/2015 22*  08/27/2015 43*  08/20/2015 60*   CREATININE, SER (mg/dL)  Date Value  10/15/2015 2.08*  09/13/2015 2.03*  09/12/2015 2.06*       Assessment:    Diabetes Mellitus type II, under excellent control.   BP at goal <140/90 CKD - stable   Plan:    1.  Rx changes: none 2.  Continue 70/30 insulin 40 units morning, 20 units supper. 3.  Counseled on nutrition goals. 4.  Praised exercise efforts. 5.  BP at goal <140/90

## 2015-10-29 ENCOUNTER — Ambulatory Visit (INDEPENDENT_AMBULATORY_CARE_PROVIDER_SITE_OTHER): Payer: Medicare Other | Admitting: Nurse Practitioner

## 2015-10-29 ENCOUNTER — Encounter: Payer: Self-pay | Admitting: Nurse Practitioner

## 2015-10-29 ENCOUNTER — Other Ambulatory Visit: Payer: Self-pay | Admitting: Nurse Practitioner

## 2015-10-29 VITALS — BP 128/80 | HR 67 | Temp 97.9°F | Resp 14 | Ht 64.0 in | Wt 224.0 lb

## 2015-10-29 DIAGNOSIS — J449 Chronic obstructive pulmonary disease, unspecified: Secondary | ICD-10-CM

## 2015-10-29 DIAGNOSIS — I5032 Chronic diastolic (congestive) heart failure: Secondary | ICD-10-CM

## 2015-10-29 DIAGNOSIS — E1122 Type 2 diabetes mellitus with diabetic chronic kidney disease: Secondary | ICD-10-CM

## 2015-10-29 DIAGNOSIS — F411 Generalized anxiety disorder: Secondary | ICD-10-CM

## 2015-10-29 DIAGNOSIS — E1165 Type 2 diabetes mellitus with hyperglycemia: Secondary | ICD-10-CM

## 2015-10-29 DIAGNOSIS — M545 Low back pain, unspecified: Secondary | ICD-10-CM | POA: Insufficient documentation

## 2015-10-29 DIAGNOSIS — G8929 Other chronic pain: Secondary | ICD-10-CM | POA: Diagnosis not present

## 2015-10-29 DIAGNOSIS — Z794 Long term (current) use of insulin: Secondary | ICD-10-CM | POA: Diagnosis not present

## 2015-10-29 DIAGNOSIS — I509 Heart failure, unspecified: Secondary | ICD-10-CM | POA: Insufficient documentation

## 2015-10-29 DIAGNOSIS — E0843 Diabetes mellitus due to underlying condition with diabetic autonomic (poly)neuropathy: Secondary | ICD-10-CM

## 2015-10-29 MED ORDER — FUROSEMIDE 40 MG PO TABS: 40.0000 mg | ORAL_TABLET | Freq: Two times a day (BID) | ORAL | Status: DC

## 2015-10-29 MED ORDER — HYDROCODONE-ACETAMINOPHEN 10-325 MG PO TABS: 1.0000 | ORAL_TABLET | Freq: Four times a day (QID) | ORAL | Status: DC | PRN

## 2015-10-29 NOTE — Progress Notes (Signed)
Patient ID: Michelle Buck, female   DOB: [birth year], 79 y.o.   MRN: [id number]    PCP: [name], NP  Advanced Directive information Does patient have an advance directive?: Yes, Type of Advance Directive: [place], Does patient want to make changes to advanced directive?: No - Patient declined  Allergies  Allergen Reactions  . Tramadol Other (See Comments)    Leg cramps   . Codeine Nausea And Vomiting    Patient states N/V with codeine  . Hydrocodone Nausea And Vomiting  . Oxycodone Other (See Comments)    Patient states she can tolerate oxycodone  . Tylenol [Acetaminophen] Hives  . Penicillins Rash    Patient states rash/itch with penicillin Has patient had a PCN reaction causing immediate rash, facial/tongue/throat swelling, SOB or lightheadedness with hypotension: Yes- broke me out and i was itching Has patient had a PCN reaction causing severe rash involving mucus membranes or skin necrosis: No Has patient had a PCN reaction that required hospitalization Yes- i was already in the hospital Has patient had a PCN reaction occurring within the last 10 years: No If all of the above answers are "NO    Chief Complaint  Patient presents with  . Hospitalization Follow-up    Hospital follow-up, seen for hypervolemia     HPI: Patient is a 79 y.o. female seen in the office today for hospital follow up. Pt with hx of DM with complications of neuropathy, hypertension, obesity, COPD, CKD stage III - IV, chronic diastolic dysfunction (last 2 D ECHO 08/2015 with stable EF). Pt went to hospital due to worsening shortness of breath, LE swelling, and weight gain and was diagnosed with   Acute respiratory failure with hypoxia due to acute COPD with acute on chronic diastolic CHF.  she was discharged with increase lasix 40 mg BID-- pt reports she HAS been taking lasix since hospitalization  breathing has been stable since discharge. Reports she was having trouble with  inhalers and neb treatments but knows how to take this correctly at this time. Pt missed cardiology appt due to being in the hospital and would like another referral  Pt also had cellulitis and was treated with clindamycin and has improved   Has hx of neuropathy due to diabetes mellitus and chronic back pain and conts on neurontin but reports legs and back still hurt  Taking norco, can not go 8 hours in between pills when pain is bad. In a lot of pain today.     Mag level and potassium were low, needing follow up labs on this. Potassium was rechecked at follow up with Cathey and WNL  Reports worsening acid reflux- taking Nexium but foods getting stuck, has taken small bites with increase water. Does not wish to follow up with GI.  Review of Systems:  Review of Systems  Constitutional: Negative for fever, chills, diaphoresis, appetite change and unexpected weight change.  HENT: Negative for congestion, ear discharge and ear pain.   Eyes: Negative for visual disturbance.  Respiratory: Negative for cough and wheezing. Shortness of breath: with exertion    Cardiovascular: Negative for chest pain and palpitations.  Gastrointestinal: Negative for constipation.  Genitourinary: Negative for dysuria.  Musculoskeletal: Positive for back pain, arthralgias and neck pain.       Worsening back pain  Skin: Negative for color change, rash and wound.  Neurological: Positive for numbness (neuropathy to hands and legs). Negative for dizziness, facial asymmetry, light-headedness and headaches.  Hematological: Negative for adenopathy.  Psychiatric/Behavioral: Negative for behavioral problems, dysphoric mood and agitation.    Past Medical History  Diagnosis Date  . Diabetes mellitus   . Hypertension   . Obese   . Hiatal hernia   . Scoliosis   . Arthritis   . Acute bronchitis   . COPD (chronic obstructive pulmonary disease) ([place])   . Unspecified hereditary and idiopathic peripheral neuropathy   .  Secondary diabetes mellitus with renal manifestations, not stated as uncontrolled, or unspecified   . Chronic kidney disease, stage II (mild)   . Chronic kidney disease, stage II (mild)   . Chronic kidney disease, unspecified ([place])   . Disorder of bone and cartilage, unspecified   . Complications affecting other specified body systems, hypertension   . Hypopotassemia   . Anxiety   . Congestive heart failure, unspecified   . GERD (gastroesophageal reflux disease)   . Lumbago   . Insomnia, unspecified   . Hyperlipidemia   . Unspecified glaucoma   . Diaphragmatic hernia without mention of obstruction or gangrene   . Osteoarthrosis, unspecified whether generalized or localized, unspecified site   . Degenerative arthritis of knee, bilateral    Past Surgical History  Procedure Laterality Date  . Abdominal hysterectomy    . Shoulder surgery    . Total hip arthroplasty      bilateral  . Knee arthroscopy      bilateral  . Colonoscopy  08/06/2011  . Foot surgery     Social History:   reports that she quit smoking about 18 years ago. Her smoking use included Cigarettes. She has never used smokeless tobacco. She reports that she does not drink alcohol or use illicit drugs.  Family History  Problem Relation Age of Onset  . Diabetes Mother   . Heart disease Mother   . Heart disease Brother   . Diabetes Brother   . Kidney disease Father   . Scoliosis Sister   . Diabetes Brother   . Diabetes Brother   . Heart disease Brother   . Scoliosis Brother   . Stroke Brother   . Heart attack Brother     Medications: Patient's Medications  New Prescriptions   No medications on file  Previous Medications   ALBUTEROL (PROVENTIL HFA;VENTOLIN HFA) 108 (90 BASE) MCG/ACT INHALER    Inhale 1 puff into the lungs every 6 (six) hours as needed for wheezing or shortness of breath.   AMITIZA 24 MCG CAPSULE    TAKE 1 CAPSULE BY MOUTH 2 TIMES A DAY WITH MEALS   ASPIRIN EC 81 MG TABLET    Take 81 mg by  mouth daily.   BUDESONIDE (PULMICORT) 0.25 MG/2ML NEBULIZER SOLUTION    Take 2 mLs (0.25 mg total) by nebulization 2 (two) times daily.   CILOSTAZOL (PLETAL) 100 MG TABLET    TAKE 1 TABLET BY MOUTH EVERY DAY   CLONIDINE (CATAPRES) 0.1 MG TABLET    TAKE 1 TABLET BY MOUTH EVERY DAY   CYCLOSPORINE (RESTASIS) 0.05 % OPHTHALMIC EMULSION    Place 1 drop into both eyes 2 (two) times daily. One drop once a day bilaterally for dry eyes   DULOXETINE (CYMBALTA) 30 MG CAPSULE    TAKE 1 CAPSULE BY MOUTH EVERY DAY TO HELP ANXIETY AND PAINS   EASY TOUCH INSULIN SYRINGE 31G X 5/16" 0.5 ML MISC    USE UP TO 2 TIMES A DAY AS DIRECTED   ESOMEPRAZOLE (NEXIUM) 40 MG CAPSULE    Take 1 capsule (40 mg total) by  mouth daily.   FUROSEMIDE (LASIX) 40 MG TABLET    Take 1 tablet (40 mg total) by mouth 2 (two) times daily.   GABAPENTIN (NEURONTIN) 100 MG CAPSULE    TAKE 2 CAPSULES BY MOUTH THREE TIMES A DAY   HYDRALAZINE (APRESOLINE) 50 MG TABLET    TAKE 1 TABLET BY MOUTH TWICE DAILY   HYDROCODONE-ACETAMINOPHEN (NORCO) 10-325 MG TABLET    Take 1 tablet by mouth every 8 (eight) hours as needed for moderate pain.   IPRATROPIUM-ALBUTEROL (DUONEB) 0.5-2.5 (3) MG/3ML SOLN    Take 3 mLs by nebulization every 4 (four) hours as needed.   METOPROLOL SUCCINATE (TOPROL-XL) 25 MG 24 HR TABLET    TAKE 1 TABLET BY MOUTH EVERY DAY WITH OR IMMEDIATELY FOLLOWING A MEAL FOR BLOOD PRESSURE   POTASSIUM CHLORIDE SA (K-DUR,KLOR-CON) 20 MEQ TABLET    TAKE 1 TABLET BY MOUTH EVERY DAY   SERTRALINE (ZOLOFT) 50 MG TABLET    TAKE 1 TABLET BY MOUTH EVERY DAY   SIMVASTATIN (ZOCOR) 20 MG TABLET    TAKE 1 TABLET BY MOUTH EVERY DAY   SUCRALFATE (CARAFATE) 1 G TABLET    TAKE 1 TABLET BY MOUTH TWICE DAILY ON EMPTY STOMACH   TIMOLOL (TIMOPTIC) 0.5 % OPHTHALMIC SOLUTION    Place 1 drop into both eyes 2 (two) times daily. One drop in bilaterally twice a day as needed   TRAVOPROST, BAK FREE, (TRAVATAN) 0.004 % SOLN OPHTHALMIC SOLUTION    Place 1 drop into both eyes  at bedtime. One drop bilaterally twice a day   VOLTAREN 1 % GEL    APPLY 4 GRAM TOPICALLY TWICE DAILY  Modified Medications   Modified Medication Previous Medication   INSULIN NPH-REGULAR HUMAN (HUMULIN 70/30) (70-30) 100 UNIT/ML INJECTION insulin NPH-regular Human (HUMULIN 70/30) (70-30) 100 UNIT/ML injection      Inject 40 units subcutaneously with breakfast and 20 units with dinner to control blood sugar.    Inject 40 units subcutaneously with breakfast and 20 units with dinner to control blood sugar.  Discontinued Medications   CLINDAMYCIN (CLEOCIN) 300 MG CAPSULE    Take 1 capsule (300 mg total) by mouth 3 (three) times daily.   DICLOFENAC SODIUM (VOLTAREN) 1 % GEL    Apply 4 g topically 2 (two) times daily as needed. For pain   GUAIFENESIN (ROBITUSSIN) 100 MG/5ML SYRUP    Take 5-10 mLs (100-200 mg total) by mouth every 4 (four) hours as needed for cough.     Physical Exam:  Filed Vitals:   10/29/15 1523  BP: 128/80  Pulse: 67  Temp: 97.9 F (36.6 C)  TempSrc: Oral  Resp: 14  Height: 5\' 4"  (1.626 m)  Weight: 224 lb (101.606 kg)  SpO2: 94%   Body mass index is 38.43 kg/(m^2).  Physical Exam  Constitutional: She is oriented to person, place, and time. She appears well-developed and well-nourished. No distress.  Obese AA female, in NAD  HENT:  Head: Normocephalic and atraumatic.  Mouth/Throat: Oropharynx is clear and moist. No oropharyngeal exudate.  Eyes: Conjunctivae are normal. Pupils are equal, round, and reactive to light.  Neck: Normal range of motion. Neck supple.  Cardiovascular: Normal rate, regular rhythm and normal heart sounds.   Pulmonary/Chest: Effort normal and breath sounds normal.  Abdominal: Soft. Bowel sounds are normal.  Musculoskeletal: She exhibits tenderness (to lumbar spine, uncomforable during visit). She exhibits no edema.  Uses rolling walker   Neurological: She is alert and oriented to person, place, and time.  Skin: Skin is warm and dry. She  is not diaphoretic.  Psychiatric: She has a normal mood and affect.    Labs reviewed: Basic Metabolic Panel:  Recent Labs  09/11/15 0400 09/12/15 0447 09/13/15 0511 10/15/15 0807  NA 142 142 138 140  K 3.4* 3.4* 3.3* 3.6  CL 104 102 100* 92*  CO2 31 31 30 26   GLUCOSE 138* 56* 124* 148*  BUN 22* 24* 24* 27  CREATININE 2.09* 2.06* 2.03* 2.08*  CALCIUM 8.7* 8.8* 8.8* 9.6  MG 1.9 1.7 1.6*  --    Liver Function Tests:  Recent Labs  09/10/15 1922 09/11/15 0400 10/15/15 0807  AST 19 14* 15  ALT 10* 8* 10  ALKPHOS 96 84 104  BILITOT 0.6 0.3 0.2  PROT 7.6 6.7 7.1  ALBUMIN 3.5 3.0* 4.1   No results for input(s): LIPASE, AMYLASE in the last 8760 hours. No results for input(s): AMMONIA in the last 8760 hours. CBC:  Recent Labs  07/02/15 1026  08/12/15 0454  08/20/15 1141  09/10/15 1922 09/12/15 0447 09/13/15 0511  WBC 6.9  < > 9.8  < > 16.2*  < > 7.1 6.2 6.7  NEUTROABS 4.4  --  8.0*  --  12.3*  --   --   --   --   HGB  --   < > 10.2*  < >  --   < > 10.8* 10.0* 9.6*  HCT 33.3*  < > 32.6*  < > 36.6  < > 34.1* 31.7* 30.5*  MCV  --   < > 85.6  < >  --   < > 82.4 83.4 82.4  PLT  --   < > PLATELET CLUMPS NOTED ON SMEAR, UNABLE TO ESTIMATE  < >  --   < > 240 272 283  < > = values in this interval not displayed. Lipid Panel:  Recent Labs  02/19/15 1057 03/14/15 0806  CHOL 174 147  HDL 44 41  LDLCALC 75 62  TRIG 275* 219*  CHOLHDL 4.0 3.6   TSH: No results for input(s): TSH in the last 8760 hours. A1C: Lab Results  Component Value Date   HGBA1C 6.9* 10/15/2015     Assessment/Plan 1. Uncontrolled type 2 diabetes mellitus with chronic kidney disease, with long-term current use of insulin, unspecified CKD stage ([place]) A1c stable in November, will refill medication at this time.  - insulin NPH-regular Human (HUMULIN 70/30) (70-30) 100 UNIT/ML injection; Inject 40 units subcutaneously with breakfast and 20 units with dinner to control blood sugar.  2. Chronic  diastolic congestive heart failure ([facility]) Stable, euvolmic a this time. Cont with lasix twice daily -will get referral to Cardiology for further follow up (missed appt due to hospitalization)  - furosemide (LASIX) 40 MG tablet; Take 1 tablet (40 mg total) by mouth 2 (two) times daily.  Dispense: 60 tablet; Refill: 1  3. Chronic obstructive pulmonary disease, unspecified COPD type ([place]) -stable on pulmicort and duonebs as needed  4. Hypomagnesemia -low in hospital, will follow up  - Magnesium  5. Diabetic autonomic neuropathy associated with diabetes mellitus due to underlying condition ([place]) -ongoing neuropathy to LE, to cont gabapentin 200 mg TID   6. Chronic midline low back pain without sciatica -pain medications not helping, increased pain with minimal activity  -will increase norco to q 6 hours PRN, pain contract being signed today, new RX printed.  - HYDROcodone-acetaminophen (NORCO) 10-325 MG tablet; Take 1 tablet by mouth every 6 (six) hours  as needed for moderate pain.  Dispense: 90 tablet; Refill: 0  7. Anxiety state Stable, pt appears to be on zoloft and cymbalta, she is unable to tell me what medication she is taking but will have her caregiver who helps with medication look over medication -to stop zoloft and remain on cymbalta if taking both. To call with any questions.   To keep follow up appt as scheduled  Teneshia Hedeen K. Harle Battiest  [facility] [phone] 8 am - 5 pm) [phone] (after hours)

## 2015-10-29 NOTE — Patient Instructions (Addendum)
Bring copy of [place] and/or Living Will to next appointment.   Stop taking zoloft, should be on cymbalta 30 mg daily for anxiety and pains  Will get labs today Keep follow up appt as scheduled  New pain prescription given- may take every 6 hours as needed

## 2015-10-30 LAB — MAGNESIUM: MAGNESIUM: 1.5 mg/dL — AB (ref 1.6–2.3)

## 2015-11-06 ENCOUNTER — Other Ambulatory Visit: Payer: Self-pay | Admitting: *Deleted

## 2015-11-06 DIAGNOSIS — R790 Abnormal level of blood mineral: Secondary | ICD-10-CM

## 2015-11-06 MED ORDER — MAGNESIUM OXIDE 400 MG PO TABS: 400.0000 mg | ORAL_TABLET | Freq: Two times a day (BID) | ORAL | Status: DC

## 2015-11-06 NOTE — Progress Notes (Signed)
Magnesium level is slightly low, please send mag oxide 400 mg twice daily to pharmacy for pt to start taking per [name]

## 2015-11-27 ENCOUNTER — Other Ambulatory Visit: Payer: Self-pay | Admitting: Nurse Practitioner

## 2015-11-28 ENCOUNTER — Encounter: Payer: Self-pay | Admitting: Cardiovascular Disease

## 2015-11-28 ENCOUNTER — Ambulatory Visit (INDEPENDENT_AMBULATORY_CARE_PROVIDER_SITE_OTHER): Payer: Medicare Other | Admitting: Cardiovascular Disease

## 2015-11-28 VITALS — BP 132/70 | HR 73 | Ht 62.0 in | Wt 225.0 lb

## 2015-11-28 DIAGNOSIS — I5032 Chronic diastolic (congestive) heart failure: Secondary | ICD-10-CM

## 2015-11-28 DIAGNOSIS — R079 Chest pain, unspecified: Secondary | ICD-10-CM | POA: Diagnosis not present

## 2015-11-28 DIAGNOSIS — J441 Chronic obstructive pulmonary disease with (acute) exacerbation: Secondary | ICD-10-CM | POA: Diagnosis not present

## 2015-11-28 DIAGNOSIS — E785 Hyperlipidemia, unspecified: Secondary | ICD-10-CM

## 2015-11-28 DIAGNOSIS — I110 Hypertensive heart disease with heart failure: Secondary | ICD-10-CM | POA: Diagnosis not present

## 2015-11-28 MED ORDER — AZITHROMYCIN 250 MG PO TABS: ORAL_TABLET | ORAL | Status: DC

## 2015-11-28 MED ORDER — PREDNISONE 20 MG PO TABS: 40.0000 mg | ORAL_TABLET | Freq: Every day | ORAL | Status: DC

## 2015-11-28 NOTE — Progress Notes (Signed)
Cardiology Office Note   Date:  11/28/2015   ID:  Michelle Buck, DOB 1936-04-27, MRN [id number]  PCP:  [name], NP  Cardiologist:   Sharol Harness, MD   Chief Complaint  Patient presents with  . New Evaluation  . Shortness of Breath  . Dizziness      History of Present Illness: Michelle Buck is a 79 y.o. female ith hypertension, diabetes, COPD, CKD IV, and chronic diastolic heart failure who presents for management of her heart failure.  Michelle Buck was admitted to the hospital from 10/11-10/14 with hypoxic thought to be due to acute on chronic diastolic heart failure.  She initially went to the hospital because her home health nurse noted lower extremity edema and shortness of breath..  She was diuresed with Lasix IV and discharged with lasix 40mg  po bid.  Her discharge weight was 103 kg.  Since discharge she has been feeling well.  She continues to have exertional dyspnea and chest pain that occur both at rest and with stress.  She has occasional chest pain when laying in bed a night.  She has no exertional chest pain, though she does not exert herself much due to bilateral knee pain.  It sometimes occurs when she sits down and improves with deep breath.  The pain is 5-6/10 and lasts for several minutes at a time.  It happens 3-4 times per week. There is no associated nausea or diaphoresis but she does feel more short of breath when she has the chest pain. She denies lower extremity edema, orthopnea or PND. She weighs herself daily and her weight has been stable since discharge. [name] does not use much salt in her diet.  Cough productive of white and yellow phlegm x3 days.  She also notes increased dyspnea.   Past Medical History  Diagnosis Date  . Diabetes mellitus   . Hypertension   . Obese   . Hiatal hernia   . Scoliosis   . Arthritis   . Acute bronchitis   . COPD (chronic obstructive pulmonary disease) ([place])   . Unspecified hereditary and idiopathic  peripheral neuropathy   . Secondary diabetes mellitus with renal manifestations, not stated as uncontrolled, or unspecified   . Chronic kidney disease, stage II (mild)   . Chronic kidney disease, stage II (mild)   . Chronic kidney disease, unspecified ([place])   . Disorder of bone and cartilage, unspecified   . Complications affecting other specified body systems, hypertension   . Hypopotassemia   . Anxiety   . Congestive heart failure, unspecified   . GERD (gastroesophageal reflux disease)   . Lumbago   . Insomnia, unspecified   . Hyperlipidemia   . Unspecified glaucoma   . Diaphragmatic hernia without mention of obstruction or gangrene   . Osteoarthrosis, unspecified whether generalized or localized, unspecified site   . Degenerative arthritis of knee, bilateral     Past Surgical History  Procedure Laterality Date  . Abdominal hysterectomy    . Shoulder surgery    . Total hip arthroplasty      bilateral  . Knee arthroscopy      bilateral  . Colonoscopy  08/06/2011  . Foot surgery       Current Outpatient Prescriptions  Medication Sig Dispense Refill  . albuterol (PROVENTIL HFA;VENTOLIN HFA) 108 (90 BASE) MCG/ACT inhaler Inhale 1 puff into the lungs every 6 (six) hours as needed for wheezing or shortness of breath.    . AMITIZA 24  MCG capsule TAKE 1 CAPSULE BY MOUTH 2 TIMES A DAY WITH MEALS 60 capsule 5  . aspirin EC 81 MG tablet Take 81 mg by mouth daily.    . budesonide (PULMICORT) 0.25 MG/2ML nebulizer solution Take 2 mLs (0.25 mg total) by nebulization 2 (two) times daily. 60 mL 1  . cilostazol (PLETAL) 100 MG tablet TAKE 1 TABLET BY MOUTH EVERY DAY 30 tablet 5  . cloNIDine (CATAPRES) 0.1 MG tablet TAKE 1 TABLET BY MOUTH EVERY DAY 30 tablet 5  . cycloSPORINE (RESTASIS) 0.05 % ophthalmic emulsion Place 1 drop into both eyes 2 (two) times daily. One drop once a day bilaterally for dry eyes    . DULoxetine (CYMBALTA) 30 MG capsule TAKE 1 CAPSULE BY MOUTH EVERY DAY TO HELP  ANXIETY AND PAINS 30 capsule 5  . EASY TOUCH INSULIN SYRINGE 31G X 5/16" 0.5 ML MISC USE UP TO 2 TIMES A DAY AS DIRECTED 100 each 2  . furosemide (LASIX) 40 MG tablet Take 1 tablet (40 mg total) by mouth 2 (two) times daily. 60 tablet 1  . gabapentin (NEURONTIN) 100 MG capsule TAKE 2 CAPSULES BY MOUTH THREE TIMES A DAY 180 capsule 5  . hydrALAZINE (APRESOLINE) 50 MG tablet TAKE 1 TABLET BY MOUTH TWICE DAILY 60 tablet 5  . HYDROcodone-acetaminophen (NORCO) 10-325 MG tablet Take 1 tablet by mouth every 6 (six) hours as needed for moderate pain. 90 tablet 0  . insulin NPH-regular Human (HUMULIN 70/30) (70-30) 100 UNIT/ML injection Inject 40 units subcutaneously with breakfast and 20 units with dinner to control blood sugar.    . ipratropium-albuterol (DUONEB) 0.5-2.5 (3) MG/3ML SOLN Take 3 mLs by nebulization every 4 (four) hours as needed. 360 mL 3  . magnesium oxide (MAG-OX) 400 MG tablet Take 1 tablet (400 mg total) by mouth 2 (two) times daily. 60 tablet 2  . metoprolol succinate (TOPROL-XL) 25 MG 24 hr tablet TAKE 1 TABLET BY MOUTH EVERY DAY WITH OR IMMEDIATELY FOLLOWING A MEAL FOR BLOOD PRESSURE 30 tablet 4  . NEXIUM 40 MG capsule TAKE 1 CAPSULE BY MOUTH ONCE DAILY 30 capsule 5  . potassium chloride SA (K-DUR,KLOR-CON) 20 MEQ tablet TAKE 1 TABLET BY MOUTH EVERY DAY 30 tablet 5  . simvastatin (ZOCOR) 20 MG tablet TAKE 1 TABLET BY MOUTH EVERY DAY 30 tablet 4  . sucralfate (CARAFATE) 1 g tablet TAKE 1 TABLET BY MOUTH TWICE DAILY ON EMPTY STOMACH 60 tablet 2  . timolol (TIMOPTIC) 0.5 % ophthalmic solution Place 1 drop into both eyes 2 (two) times daily. One drop in bilaterally twice a day as needed    . Travoprost, BAK Free, (TRAVATAN) 0.004 % SOLN ophthalmic solution Place 1 drop into both eyes at bedtime. One drop bilaterally twice a day    . VOLTAREN 1 % GEL APPLY 4 GRAM TOPICALLY TWICE DAILY 100 g 5  . azithromycin (ZITHROMAX Z-PAK) 250 MG tablet Take 2 tablets today and 1 tablet for four days 6  each 0  . predniSONE (DELTASONE) 20 MG tablet Take 2 tablets (40 mg total) by mouth daily with breakfast. 10 tablet 0   No current facility-administered medications for this visit.    Allergies:   Tramadol; Codeine; Hydrocodone; Oxycodone; Tylenol; and Penicillins    Social History:  The patient  reports that she quit smoking about 18 years ago. Her smoking use included Cigarettes. She has never used smokeless tobacco. She reports that she does not drink alcohol or use illicit drugs.   Family  History:  The patient's family history includes Diabetes in her brother, brother, brother, and mother; Heart attack in her brother; Heart disease in her brother, brother, and mother; Kidney disease in her father; Scoliosis in her brother and sister; Stroke in her brother.    ROS:  Please see the history of present illness.   Otherwise, review of systems are positive for none.   All other systems are reviewed and negative.    PHYSICAL EXAM: VS:  BP 132/70 mmHg  Pulse 73  Ht 5\' 2"  (1.575 m)  Wt 102.059 kg (225 lb)  BMI 41.14 kg/m2 , BMI Body mass index is 41.14 kg/(m^2). GENERAL:  Well appearing.  Frequent coughing throughout the exam. HEENT:  Pupils equal round and reactive, fundi not visualized, oral mucosa unremarkable NECK:  No jugular venous distention, waveform within normal limits, carotid upstroke brisk and symmetric, no bruits, no thyromegaly LYMPHATICS:  No cervical adenopathy LUNGS:  Coarse breath sounds and expiratory wheezing bilaterally. Decreased air movement throughout. HEART:  RRR.  PMI not displaced or sustained,S1 and S2 within normal limits, no S3, no S4, no clicks, no rubs, no murmurs ABD:  Flat, positive bowel sounds normal in frequency in pitch, no bruits, no rebound, no guarding, no midline pulsatile mass, no hepatomegaly, no splenomegaly EXT:  2 plus pulses throughout, no edema, no cyanosis no clubbing SKIN:  No rashes no nodules NEURO:  Cranial nerves II through XII  grossly intact, motor grossly intact throughout PSYCH:  Cognitively intact, oriented to person place and time   EKG:  EKG is not ordered today.  Echo 08/13/15: Study Conclusions  - Left ventricle: The cavity size was normal. Wall thickness was normal. Systolic function was normal. The estimated ejection fraction was in the range of 60% to 65%. Wall motion was normal; there were no regional wall motion abnormalities. The study is not technically sufficient to allow evaluation of LV diastolic function. - Mitral valve: Mildly thickened leaflets . There was mild regurgitation. - Left atrium: The atrium was normal in size. - Inferior vena cava: The vessel was dilated. The respirophasic diameter changes were blunted (< 50%), consistent with elevated central venous pressure.  Impressions:  - LVEF 60-65%, normal wall thickness and wall motion, cannot determine diastolic function, normal LA size, dilated IVC.  Recent Labs: 09/10/2015: B Natriuretic Peptide 29.8 09/13/2015: Hemoglobin 9.6*; Platelets 283 10/15/2015: ALT 10; BUN 27; Creatinine, Ser 2.08*; Potassium 3.6; Sodium 140 10/29/2015: Magnesium 1.5*    Lipid Panel    Component Value Date/Time   CHOL 147 03/14/2015 0806   CHOL 144 05/14/2010 2347   TRIG 219* 03/14/2015 0806   HDL 41 03/14/2015 0806   HDL 48 05/14/2010 2347   CHOLHDL 3.6 03/14/2015 0806   CHOLHDL 3.0 Ratio 05/14/2010 2347   VLDL 40 05/14/2010 2347   LDLCALC 62 03/14/2015 0806   LDLCALC 56 05/14/2010 2347      Wt Readings from Last 3 Encounters:  11/28/15 102.059 kg (225 lb)  10/29/15 101.606 kg (224 lb)  10/28/15 103.42 kg (228 lb)      ASSESSMENT AND PLAN:  # Chronic diastolic heart failure: Ms. Beever is euvolemic and her weight is stable.  She was advised to continue wearing herself daily and to avoid salt.  Her blood pressure is well-controlled. Continue lasix, clonidine,hydralazine, and metoprolol.  She is not on an  ACE-I/ARB due to chronic kidney disease.  # Chest pain: Symptoms are atypical, but she does have risk factors for coronary artery disease. Also, she  does not exert herself much so it is difficult to assess the exertional nature of her symptoms.  We will refer her for Lexiscan Cardiolite stress testing.  Continue aspirin and simvastatin.  # COPD exacerbation: Ms. Chapo has a mild COPD exacerbation ith purulent sputum and increased shortness of breath.  We will treat her with a Z-Pak and 5 days of prednisone 40 mg daily.  # Hypertensive heart disease: Blood pressure well-controlled. Continue antihypertensives as above.  # Hyperlipidemia: Continue simvastatin.  Current medicines are reviewed at length with the patient today.  The patient does not have concerns regarding medicines.  The following changes have been made:  no change  Labs/ tests ordered today include:   Orders Placed This Encounter  Procedures  . Myocardial Perfusion Imaging     Disposition:   FU with Nekhi Liwanag C. Oval Linsey, MD, [facility] in 6 months   Signed, Tallula Grindle C. Oval Linsey, MD, [facility]  11/28/2015 5:13 PM    [facility]

## 2015-11-28 NOTE — Patient Instructions (Addendum)
Your physician wants you to follow-up in: 6 Months. You will receive a reminder letter in the mail two months in advance. If you don't receive a letter, please call our office to schedule the follow-up appointment.  Your physician has requested that you have a lexiscan myoview. For further information please visit [email or web address]. Please follow instruction sheet, as given. After January 10th  Your physician has recommended you make the following change in your medication: START Azithromycin take 2 tablets today and 1 tablet daily for 4 days and Prednisone 20 mg take 2 tablets daily for 5 days

## 2015-11-29 ENCOUNTER — Other Ambulatory Visit: Payer: Self-pay | Admitting: Cardiovascular Disease

## 2015-11-29 MED ORDER — AZITHROMYCIN 250 MG PO TABS: ORAL_TABLET | ORAL | Status: DC

## 2015-11-29 MED ORDER — PREDNISONE 20 MG PO TABS: 40.0000 mg | ORAL_TABLET | Freq: Every day | ORAL | Status: DC

## 2015-11-29 NOTE — Telephone Encounter (Signed)
Spoke to pt to confirm pharmacy. She asked for 30 day supply, so I went over the instructions again with her and she understood. Azithromycin 250mg , take 2 tablets on the first day and then 1 tablet for 4 days; Prednisone 20 mg, take 2 tablets daily for 5 days.

## 2015-11-29 NOTE — Telephone Encounter (Signed)
°*  STAT* If patient is at the pharmacy, call can be transferred to refill team.   1. Which medications need to be refilled? (please list name of each medication and dose if known) Azithromycin 250mg  and Prednisone 20mg    2. Which pharmacy/location (including street and city if local pharmacy) is medication to be sent to? [facility] on [address]   3. Do they need a 30 day or 90 day supply? 30  * medications were called into the wrong pharmacy yesterday*

## 2015-12-04 ENCOUNTER — Emergency Department (HOSPITAL_COMMUNITY): Payer: Medicare Other

## 2015-12-04 ENCOUNTER — Encounter (HOSPITAL_COMMUNITY): Payer: Self-pay | Admitting: Emergency Medicine

## 2015-12-04 ENCOUNTER — Inpatient Hospital Stay (HOSPITAL_COMMUNITY)
Admission: EM | Admit: 2015-12-04 | Discharge: 2015-12-08 | DRG: 190 | Disposition: A | Discharge status: Home Health | Payer: Medicare Other | Attending: Internal Medicine | Admitting: Internal Medicine

## 2015-12-04 DIAGNOSIS — E785 Hyperlipidemia, unspecified: Secondary | ICD-10-CM | POA: Diagnosis present

## 2015-12-04 DIAGNOSIS — K219 Gastro-esophageal reflux disease without esophagitis: Secondary | ICD-10-CM | POA: Diagnosis present

## 2015-12-04 DIAGNOSIS — Z885 Allergy status to narcotic agent status: Secondary | ICD-10-CM

## 2015-12-04 DIAGNOSIS — Z6841 Body Mass Index (BMI) 40.0 and over, adult: Secondary | ICD-10-CM

## 2015-12-04 DIAGNOSIS — J440 Chronic obstructive pulmonary disease with acute lower respiratory infection: Secondary | ICD-10-CM | POA: Diagnosis not present

## 2015-12-04 DIAGNOSIS — I130 Hypertensive heart and chronic kidney disease with heart failure and stage 1 through stage 4 chronic kidney disease, or unspecified chronic kidney disease: Secondary | ICD-10-CM | POA: Diagnosis not present

## 2015-12-04 DIAGNOSIS — E1121 Type 2 diabetes mellitus with diabetic nephropathy: Secondary | ICD-10-CM | POA: Diagnosis present

## 2015-12-04 DIAGNOSIS — R0602 Shortness of breath: Secondary | ICD-10-CM | POA: Diagnosis not present

## 2015-12-04 DIAGNOSIS — Z7952 Long term (current) use of systemic steroids: Secondary | ICD-10-CM

## 2015-12-04 DIAGNOSIS — E1122 Type 2 diabetes mellitus with diabetic chronic kidney disease: Secondary | ICD-10-CM | POA: Diagnosis present

## 2015-12-04 DIAGNOSIS — I10 Essential (primary) hypertension: Secondary | ICD-10-CM | POA: Diagnosis present

## 2015-12-04 DIAGNOSIS — Z8249 Family history of ischemic heart disease and other diseases of the circulatory system: Secondary | ICD-10-CM

## 2015-12-04 DIAGNOSIS — N182 Chronic kidney disease, stage 2 (mild): Secondary | ICD-10-CM | POA: Diagnosis present

## 2015-12-04 DIAGNOSIS — F419 Anxiety disorder, unspecified: Secondary | ICD-10-CM | POA: Diagnosis present

## 2015-12-04 DIAGNOSIS — Z87891 Personal history of nicotine dependence: Secondary | ICD-10-CM | POA: Diagnosis not present

## 2015-12-04 DIAGNOSIS — I509 Heart failure, unspecified: Secondary | ICD-10-CM

## 2015-12-04 DIAGNOSIS — E1140 Type 2 diabetes mellitus with diabetic neuropathy, unspecified: Secondary | ICD-10-CM | POA: Diagnosis not present

## 2015-12-04 DIAGNOSIS — M170 Bilateral primary osteoarthritis of knee: Secondary | ICD-10-CM | POA: Diagnosis present

## 2015-12-04 DIAGNOSIS — I2510 Atherosclerotic heart disease of native coronary artery without angina pectoris: Secondary | ICD-10-CM | POA: Diagnosis present

## 2015-12-04 DIAGNOSIS — J189 Pneumonia, unspecified organism: Secondary | ICD-10-CM | POA: Diagnosis not present

## 2015-12-04 DIAGNOSIS — Z888 Allergy status to other drugs, medicaments and biological substances status: Secondary | ICD-10-CM

## 2015-12-04 DIAGNOSIS — Z833 Family history of diabetes mellitus: Secondary | ICD-10-CM

## 2015-12-04 DIAGNOSIS — M419 Scoliosis, unspecified: Secondary | ICD-10-CM | POA: Diagnosis present

## 2015-12-04 DIAGNOSIS — Z7982 Long term (current) use of aspirin: Secondary | ICD-10-CM

## 2015-12-04 DIAGNOSIS — N189 Chronic kidney disease, unspecified: Secondary | ICD-10-CM | POA: Diagnosis present

## 2015-12-04 DIAGNOSIS — Z880 Allergy status to penicillin: Secondary | ICD-10-CM

## 2015-12-04 DIAGNOSIS — Z96649 Presence of unspecified artificial hip joint: Secondary | ICD-10-CM | POA: Diagnosis present

## 2015-12-04 DIAGNOSIS — Z79899 Other long term (current) drug therapy: Secondary | ICD-10-CM

## 2015-12-04 DIAGNOSIS — M899 Disorder of bone, unspecified: Secondary | ICD-10-CM | POA: Diagnosis present

## 2015-12-04 DIAGNOSIS — Z823 Family history of stroke: Secondary | ICD-10-CM

## 2015-12-04 DIAGNOSIS — R0600 Dyspnea, unspecified: Secondary | ICD-10-CM | POA: Diagnosis not present

## 2015-12-04 DIAGNOSIS — Z66 Do not resuscitate: Secondary | ICD-10-CM | POA: Diagnosis present

## 2015-12-04 DIAGNOSIS — H409 Unspecified glaucoma: Secondary | ICD-10-CM | POA: Diagnosis present

## 2015-12-04 DIAGNOSIS — I5032 Chronic diastolic (congestive) heart failure: Secondary | ICD-10-CM | POA: Diagnosis not present

## 2015-12-04 DIAGNOSIS — Y95 Nosocomial condition: Secondary | ICD-10-CM | POA: Diagnosis present

## 2015-12-04 DIAGNOSIS — R05 Cough: Secondary | ICD-10-CM | POA: Diagnosis not present

## 2015-12-04 DIAGNOSIS — Z841 Family history of disorders of kidney and ureter: Secondary | ICD-10-CM

## 2015-12-04 DIAGNOSIS — E876 Hypokalemia: Secondary | ICD-10-CM | POA: Diagnosis present

## 2015-12-04 DIAGNOSIS — Z794 Long term (current) use of insulin: Secondary | ICD-10-CM

## 2015-12-04 DIAGNOSIS — E669 Obesity, unspecified: Secondary | ICD-10-CM | POA: Diagnosis present

## 2015-12-04 LAB — CBC
HEMATOCRIT: 37.5 % (ref 36.0–46.0)
Hemoglobin: 11.8 g/dL — ABNORMAL LOW (ref 12.0–15.0)
MCH: 24.6 pg — ABNORMAL LOW (ref 26.0–34.0)
MCHC: 31.5 g/dL (ref 30.0–36.0)
MCV: 78.3 fL (ref 78.0–100.0)
Platelets: 330 10*3/uL (ref 150–400)
RBC: 4.79 MIL/uL (ref 3.87–5.11)
RDW: 16.6 % — ABNORMAL HIGH (ref 11.5–15.5)
WBC: 10.5 10*3/uL (ref 4.0–10.5)

## 2015-12-04 LAB — I-STAT TROPONIN, ED: Troponin i, poc: 0.02 ng/mL (ref 0.00–0.08)

## 2015-12-04 LAB — BASIC METABOLIC PANEL
Anion gap: 12 (ref 5–15)
BUN: 42 mg/dL — ABNORMAL HIGH (ref 6–20)
CO2: 27 mmol/L (ref 22–32)
Calcium: 9.3 mg/dL (ref 8.9–10.3)
Chloride: 102 mmol/L (ref 101–111)
Creatinine, Ser: 2.55 mg/dL — ABNORMAL HIGH (ref 0.44–1.00)
GFR calc Af Amer: 20 mL/min — ABNORMAL LOW (ref >60)
GFR calc non Af Amer: 17 mL/min — ABNORMAL LOW (ref >60)
Glucose, Bld: 164 mg/dL — ABNORMAL HIGH (ref 65–99)
Potassium: 2.9 mmol/L — ABNORMAL LOW (ref 3.5–5.1)
Sodium: 141 mmol/L (ref 135–145)

## 2015-12-04 NOTE — ED Notes (Signed)
Began coughing x 1 week, was given antibiotics, steroids, and breathing treatments without any improvement. O2 sat 92% in triage, does not appear to be in obvious distress. States it's been a productive cough with nausea, no vomiting, no fever. Expiratory wheeze on right lung in triage.

## 2015-12-05 ENCOUNTER — Encounter (HOSPITAL_COMMUNITY): Payer: Self-pay | Admitting: Family Medicine

## 2015-12-05 DIAGNOSIS — N183 Chronic kidney disease, stage 3 unspecified: Secondary | ICD-10-CM | POA: Insufficient documentation

## 2015-12-05 DIAGNOSIS — E1140 Type 2 diabetes mellitus with diabetic neuropathy, unspecified: Secondary | ICD-10-CM | POA: Diagnosis present

## 2015-12-05 DIAGNOSIS — Z841 Family history of disorders of kidney and ureter: Secondary | ICD-10-CM | POA: Diagnosis not present

## 2015-12-05 DIAGNOSIS — Y95 Nosocomial condition: Secondary | ICD-10-CM | POA: Diagnosis present

## 2015-12-05 DIAGNOSIS — I10 Essential (primary) hypertension: Secondary | ICD-10-CM

## 2015-12-05 DIAGNOSIS — E1121 Type 2 diabetes mellitus with diabetic nephropathy: Secondary | ICD-10-CM | POA: Diagnosis present

## 2015-12-05 DIAGNOSIS — Z87891 Personal history of nicotine dependence: Secondary | ICD-10-CM | POA: Diagnosis not present

## 2015-12-05 DIAGNOSIS — Z888 Allergy status to other drugs, medicaments and biological substances status: Secondary | ICD-10-CM | POA: Diagnosis not present

## 2015-12-05 DIAGNOSIS — Z880 Allergy status to penicillin: Secondary | ICD-10-CM | POA: Diagnosis not present

## 2015-12-05 DIAGNOSIS — Z66 Do not resuscitate: Secondary | ICD-10-CM | POA: Diagnosis present

## 2015-12-05 DIAGNOSIS — Z6841 Body Mass Index (BMI) 40.0 and over, adult: Secondary | ICD-10-CM | POA: Diagnosis not present

## 2015-12-05 DIAGNOSIS — I130 Hypertensive heart and chronic kidney disease with heart failure and stage 1 through stage 4 chronic kidney disease, or unspecified chronic kidney disease: Secondary | ICD-10-CM | POA: Diagnosis present

## 2015-12-05 DIAGNOSIS — M170 Bilateral primary osteoarthritis of knee: Secondary | ICD-10-CM | POA: Diagnosis present

## 2015-12-05 DIAGNOSIS — Z8249 Family history of ischemic heart disease and other diseases of the circulatory system: Secondary | ICD-10-CM | POA: Diagnosis not present

## 2015-12-05 DIAGNOSIS — R0600 Dyspnea, unspecified: Secondary | ICD-10-CM | POA: Diagnosis not present

## 2015-12-05 DIAGNOSIS — E669 Obesity, unspecified: Secondary | ICD-10-CM | POA: Diagnosis present

## 2015-12-05 DIAGNOSIS — Z833 Family history of diabetes mellitus: Secondary | ICD-10-CM | POA: Diagnosis not present

## 2015-12-05 DIAGNOSIS — E0821 Diabetes mellitus due to underlying condition with diabetic nephropathy: Secondary | ICD-10-CM | POA: Diagnosis not present

## 2015-12-05 DIAGNOSIS — E876 Hypokalemia: Secondary | ICD-10-CM

## 2015-12-05 DIAGNOSIS — Z96649 Presence of unspecified artificial hip joint: Secondary | ICD-10-CM | POA: Diagnosis present

## 2015-12-05 DIAGNOSIS — Z79899 Other long term (current) drug therapy: Secondary | ICD-10-CM | POA: Diagnosis not present

## 2015-12-05 DIAGNOSIS — I5032 Chronic diastolic (congestive) heart failure: Secondary | ICD-10-CM | POA: Diagnosis not present

## 2015-12-05 DIAGNOSIS — Z7952 Long term (current) use of systemic steroids: Secondary | ICD-10-CM | POA: Diagnosis not present

## 2015-12-05 DIAGNOSIS — E1122 Type 2 diabetes mellitus with diabetic chronic kidney disease: Secondary | ICD-10-CM | POA: Diagnosis not present

## 2015-12-05 DIAGNOSIS — J440 Chronic obstructive pulmonary disease with acute lower respiratory infection: Secondary | ICD-10-CM | POA: Diagnosis present

## 2015-12-05 DIAGNOSIS — J189 Pneumonia, unspecified organism: Secondary | ICD-10-CM | POA: Diagnosis not present

## 2015-12-05 DIAGNOSIS — Z823 Family history of stroke: Secondary | ICD-10-CM | POA: Diagnosis not present

## 2015-12-05 DIAGNOSIS — I2510 Atherosclerotic heart disease of native coronary artery without angina pectoris: Secondary | ICD-10-CM | POA: Diagnosis present

## 2015-12-05 DIAGNOSIS — Z794 Long term (current) use of insulin: Secondary | ICD-10-CM

## 2015-12-05 DIAGNOSIS — M899 Disorder of bone, unspecified: Secondary | ICD-10-CM | POA: Diagnosis present

## 2015-12-05 DIAGNOSIS — K219 Gastro-esophageal reflux disease without esophagitis: Secondary | ICD-10-CM | POA: Diagnosis present

## 2015-12-05 DIAGNOSIS — Z885 Allergy status to narcotic agent status: Secondary | ICD-10-CM | POA: Diagnosis not present

## 2015-12-05 DIAGNOSIS — M419 Scoliosis, unspecified: Secondary | ICD-10-CM | POA: Diagnosis present

## 2015-12-05 DIAGNOSIS — E785 Hyperlipidemia, unspecified: Secondary | ICD-10-CM | POA: Diagnosis present

## 2015-12-05 DIAGNOSIS — N182 Chronic kidney disease, stage 2 (mild): Secondary | ICD-10-CM | POA: Diagnosis present

## 2015-12-05 DIAGNOSIS — Z7982 Long term (current) use of aspirin: Secondary | ICD-10-CM | POA: Diagnosis not present

## 2015-12-05 DIAGNOSIS — F419 Anxiety disorder, unspecified: Secondary | ICD-10-CM | POA: Diagnosis present

## 2015-12-05 DIAGNOSIS — H409 Unspecified glaucoma: Secondary | ICD-10-CM | POA: Diagnosis present

## 2015-12-05 LAB — BRAIN NATRIURETIC PEPTIDE
B Natriuretic Peptide: 23.6 pg/mL (ref 0.0–100.0)
B Natriuretic Peptide: 20.3 pg/mL (ref 0.0–100.0)

## 2015-12-05 LAB — GLUCOSE, CAPILLARY
GLUCOSE-CAPILLARY: 249 mg/dL — AB (ref 65–99)
Glucose-Capillary: 217 mg/dL — ABNORMAL HIGH (ref 65–99)
Glucose-Capillary: 172 mg/dL — ABNORMAL HIGH (ref 65–99)

## 2015-12-05 LAB — PROCALCITONIN

## 2015-12-05 LAB — STREP PNEUMONIAE URINARY ANTIGEN: Strep Pneumo Urinary Antigen: NEGATIVE

## 2015-12-05 LAB — CBG MONITORING, ED: GLUCOSE-CAPILLARY: 253 mg/dL — AB (ref 65–99)

## 2015-12-05 LAB — MAGNESIUM: MAGNESIUM: 1.9 mg/dL (ref 1.7–2.4)

## 2015-12-05 LAB — HIV ANTIBODY (ROUTINE TESTING W REFLEX): HIV Screen 4th Generation wRfx: NONREACTIVE

## 2015-12-05 MED ORDER — TIMOLOL MALEATE 0.5 % OP SOLN
1.0000 [drp] | Freq: Two times a day (BID) | OPHTHALMIC | Status: DC
  Administered 2015-12-05 – 2015-12-08 (×7): 1 [drp] via OPHTHALMIC
  Filled 2015-12-05 (×2): qty 5

## 2015-12-05 MED ORDER — IPRATROPIUM-ALBUTEROL 0.5-2.5 (3) MG/3ML IN SOLN
3.0000 mL | Freq: Three times a day (TID) | RESPIRATORY_TRACT | Status: DC
Start: 2015-12-05 — End: 2015-12-07
  Administered 2015-12-05 – 2015-12-07 (×7): 3 mL via RESPIRATORY_TRACT
  Filled 2015-12-05 (×7): qty 3

## 2015-12-05 MED ORDER — VANCOMYCIN HCL 10 G IV SOLR
1500.0000 mg | Freq: Once | INTRAVENOUS | Status: AC
  Administered 2015-12-05: 1500 mg via INTRAVENOUS
  Filled 2015-12-05: qty 1500

## 2015-12-05 MED ORDER — ONDANSETRON HCL 4 MG/2ML IJ SOLN: 4.0000 mg | Freq: Four times a day (QID) | INTRAMUSCULAR | Status: DC | PRN

## 2015-12-05 MED ORDER — FUROSEMIDE 40 MG PO TABS
40.0000 mg | ORAL_TABLET | Freq: Two times a day (BID) | ORAL | Status: DC
  Administered 2015-12-05 – 2015-12-08 (×6): 40 mg via ORAL
  Filled 2015-12-05 (×6): qty 1

## 2015-12-05 MED ORDER — DEXTROSE 5 % IV SOLN
2.0000 g | Freq: Once | INTRAVENOUS | Status: AC
  Administered 2015-12-05: 2 g via INTRAVENOUS
  Filled 2015-12-05: qty 2

## 2015-12-05 MED ORDER — POTASSIUM CHLORIDE 10 MEQ/100ML IV SOLN
10.0000 meq | INTRAVENOUS | Status: AC
  Administered 2015-12-05 (×4): 10 meq via INTRAVENOUS
  Filled 2015-12-05 (×2): qty 100

## 2015-12-05 MED ORDER — DEXTROSE 5 % IV SOLN: 2.0000 g | Freq: Three times a day (TID) | INTRAVENOUS | Status: DC

## 2015-12-05 MED ORDER — SODIUM CHLORIDE 0.9 % IJ SOLN: 3.0000 mL | INTRAMUSCULAR | Status: DC | PRN

## 2015-12-05 MED ORDER — PREDNISONE 20 MG PO TABS
60.0000 mg | ORAL_TABLET | ORAL | Status: AC
  Administered 2015-12-05: 60 mg via ORAL
  Filled 2015-12-05: qty 3

## 2015-12-05 MED ORDER — IPRATROPIUM-ALBUTEROL 0.5-2.5 (3) MG/3ML IN SOLN
3.0000 mL | RESPIRATORY_TRACT | Status: DC | PRN
  Filled 2015-12-05: qty 3

## 2015-12-05 MED ORDER — SODIUM CHLORIDE 0.9 % IV SOLN: 250.0000 mL | INTRAVENOUS | Status: DC | PRN

## 2015-12-05 MED ORDER — SUCRALFATE 1 G PO TABS
1.0000 g | ORAL_TABLET | Freq: Two times a day (BID) | ORAL | Status: DC
  Administered 2015-12-05 – 2015-12-08 (×8): 1 g via ORAL
  Filled 2015-12-05 (×10): qty 1

## 2015-12-05 MED ORDER — ASPIRIN EC 81 MG PO TBEC
81.0000 mg | DELAYED_RELEASE_TABLET | Freq: Every day | ORAL | Status: DC
  Administered 2015-12-05 – 2015-12-08 (×4): 81 mg via ORAL
  Filled 2015-12-05 (×4): qty 1

## 2015-12-05 MED ORDER — BUDESONIDE 0.25 MG/2ML IN SUSP
0.2500 mg | Freq: Two times a day (BID) | RESPIRATORY_TRACT | Status: DC
  Administered 2015-12-05 – 2015-12-08 (×7): 0.25 mg via RESPIRATORY_TRACT
  Filled 2015-12-05 (×8): qty 2

## 2015-12-05 MED ORDER — HYDRALAZINE HCL 50 MG PO TABS
50.0000 mg | ORAL_TABLET | Freq: Two times a day (BID) | ORAL | Status: DC
  Administered 2015-12-05 – 2015-12-08 (×8): 50 mg via ORAL
  Filled 2015-12-05 (×9): qty 1

## 2015-12-05 MED ORDER — CILOSTAZOL 100 MG PO TABS
100.0000 mg | ORAL_TABLET | Freq: Every day | ORAL | Status: DC
  Administered 2015-12-05 – 2015-12-08 (×4): 100 mg via ORAL
  Filled 2015-12-05 (×4): qty 1

## 2015-12-05 MED ORDER — ALBUTEROL SULFATE HFA 108 (90 BASE) MCG/ACT IN AERS: 1.0000 | INHALATION_SPRAY | Freq: Four times a day (QID) | RESPIRATORY_TRACT | Status: DC | PRN

## 2015-12-05 MED ORDER — SIMVASTATIN 20 MG PO TABS
20.0000 mg | ORAL_TABLET | Freq: Every day | ORAL | Status: DC
  Administered 2015-12-05 – 2015-12-07 (×3): 20 mg via ORAL
  Filled 2015-12-05 (×4): qty 1

## 2015-12-05 MED ORDER — GABAPENTIN 100 MG PO CAPS
200.0000 mg | ORAL_CAPSULE | Freq: Three times a day (TID) | ORAL | Status: DC
  Administered 2015-12-05 – 2015-12-08 (×10): 200 mg via ORAL
  Filled 2015-12-05 (×11): qty 2

## 2015-12-05 MED ORDER — DULOXETINE HCL 30 MG PO CPEP
30.0000 mg | ORAL_CAPSULE | Freq: Every day | ORAL | Status: DC
Start: 2015-12-05 — End: 2015-12-08
  Administered 2015-12-05 – 2015-12-08 (×4): 30 mg via ORAL
  Filled 2015-12-05 (×4): qty 1

## 2015-12-05 MED ORDER — POTASSIUM CHLORIDE CRYS ER 20 MEQ PO TBCR: 40.0000 meq | EXTENDED_RELEASE_TABLET | Freq: Two times a day (BID) | ORAL | Status: DC

## 2015-12-05 MED ORDER — ACETAMINOPHEN 325 MG PO TABS: 650.0000 mg | ORAL_TABLET | ORAL | Status: DC | PRN

## 2015-12-05 MED ORDER — DM-GUAIFENESIN ER 30-600 MG PO TB12: 1.0000 | ORAL_TABLET | Freq: Two times a day (BID) | ORAL | Status: DC

## 2015-12-05 MED ORDER — CYCLOSPORINE 0.05 % OP EMUL
1.0000 [drp] | Freq: Two times a day (BID) | OPHTHALMIC | Status: DC
  Administered 2015-12-05 – 2015-12-08 (×8): 1 [drp] via OPHTHALMIC
  Filled 2015-12-05 (×9): qty 1

## 2015-12-05 MED ORDER — HYDROCODONE-ACETAMINOPHEN 10-325 MG PO TABS
1.0000 | ORAL_TABLET | Freq: Four times a day (QID) | ORAL | Status: DC | PRN
  Administered 2015-12-05 – 2015-12-08 (×8): 1 via ORAL
  Filled 2015-12-05 (×8): qty 1

## 2015-12-05 MED ORDER — CLONIDINE HCL 0.1 MG PO TABS
0.1000 mg | ORAL_TABLET | Freq: Every day | ORAL | Status: DC
  Administered 2015-12-05 – 2015-12-08 (×4): 0.1 mg via ORAL
  Filled 2015-12-05 (×4): qty 1

## 2015-12-05 MED ORDER — MAGNESIUM SULFATE 50 % IJ SOLN: 1.0000 g | Freq: Once | INTRAMUSCULAR | Status: DC

## 2015-12-05 MED ORDER — VANCOMYCIN HCL IN DEXTROSE 1-5 GM/200ML-% IV SOLN
1000.0000 mg | INTRAVENOUS | Status: DC
  Administered 2015-12-05: 1000 mg via INTRAVENOUS
  Filled 2015-12-05: qty 200

## 2015-12-05 MED ORDER — INSULIN ASPART 100 UNIT/ML ~~LOC~~ SOLN
0.0000 [IU] | Freq: Three times a day (TID) | SUBCUTANEOUS | Status: DC
  Administered 2015-12-05: 7 [IU] via SUBCUTANEOUS
  Administered 2015-12-05: 11 [IU] via SUBCUTANEOUS
  Administered 2015-12-05: 7 [IU] via SUBCUTANEOUS
  Administered 2015-12-06: 4 [IU] via SUBCUTANEOUS
  Administered 2015-12-06: 3 [IU] via SUBCUTANEOUS
  Administered 2015-12-06 – 2015-12-07 (×2): 4 [IU] via SUBCUTANEOUS
  Administered 2015-12-07 – 2015-12-08 (×2): 3 [IU] via SUBCUTANEOUS
  Administered 2015-12-08: 4 [IU] via SUBCUTANEOUS
  Filled 2015-12-05: qty 1

## 2015-12-05 MED ORDER — AZTREONAM 1 G IJ SOLR
1.0000 g | Freq: Three times a day (TID) | INTRAMUSCULAR | Status: DC
  Administered 2015-12-05 (×3): 1 g via INTRAVENOUS
  Filled 2015-12-05 (×6): qty 1

## 2015-12-05 MED ORDER — LISINOPRIL 2.5 MG PO TABS
2.5000 mg | ORAL_TABLET | Freq: Every day | ORAL | Status: DC
  Administered 2015-12-05 – 2015-12-08 (×4): 2.5 mg via ORAL
  Filled 2015-12-05 (×4): qty 1

## 2015-12-05 MED ORDER — LATANOPROST 0.005 % OP SOLN
1.0000 [drp] | Freq: Every day | OPHTHALMIC | Status: DC
  Administered 2015-12-05 – 2015-12-07 (×3): 1 [drp] via OPHTHALMIC
  Filled 2015-12-05: qty 2.5

## 2015-12-05 MED ORDER — INSULIN GLARGINE 100 UNIT/ML ~~LOC~~ SOLN
20.0000 [IU] | Freq: Every day | SUBCUTANEOUS | Status: DC
  Administered 2015-12-05 – 2015-12-07 (×3): 20 [IU] via SUBCUTANEOUS
  Filled 2015-12-05 (×4): qty 0.2

## 2015-12-05 MED ORDER — POLYETHYLENE GLYCOL 3350 17 G PO PACK
17.0000 g | PACK | Freq: Every day | ORAL | Status: DC | PRN
  Administered 2015-12-05 – 2015-12-07 (×3): 17 g via ORAL
  Filled 2015-12-05 (×2): qty 1

## 2015-12-05 MED ORDER — SODIUM CHLORIDE 0.9 % IJ SOLN
3.0000 mL | Freq: Two times a day (BID) | INTRAMUSCULAR | Status: DC
  Administered 2015-12-05 – 2015-12-08 (×6): 3 mL via INTRAVENOUS

## 2015-12-05 MED ORDER — POTASSIUM CHLORIDE CRYS ER 20 MEQ PO TBCR
40.0000 meq | EXTENDED_RELEASE_TABLET | Freq: Two times a day (BID) | ORAL | Status: DC
  Administered 2015-12-05 – 2015-12-08 (×8): 40 meq via ORAL
  Filled 2015-12-05 (×8): qty 2

## 2015-12-05 MED ORDER — METOPROLOL SUCCINATE ER 25 MG PO TB24
25.0000 mg | ORAL_TABLET | Freq: Every day | ORAL | Status: DC
  Administered 2015-12-05 – 2015-12-08 (×4): 25 mg via ORAL
  Filled 2015-12-05 (×4): qty 1

## 2015-12-05 MED ORDER — DM-GUAIFENESIN ER 30-600 MG PO TB12
1.0000 | ORAL_TABLET | Freq: Two times a day (BID) | ORAL | Status: DC
  Administered 2015-12-05 – 2015-12-08 (×6): 1 via ORAL
  Filled 2015-12-05 (×6): qty 1

## 2015-12-05 MED ORDER — MAGNESIUM SULFATE IN D5W 10-5 MG/ML-% IV SOLN
1.0000 g | Freq: Once | INTRAVENOUS | Status: AC
  Administered 2015-12-05: 1 g via INTRAVENOUS
  Filled 2015-12-05: qty 100

## 2015-12-05 MED ORDER — ALBUTEROL SULFATE (2.5 MG/3ML) 0.083% IN NEBU
5.0000 mg | INHALATION_SOLUTION | Freq: Once | RESPIRATORY_TRACT | Status: AC
  Administered 2015-12-05: 5 mg via RESPIRATORY_TRACT
  Filled 2015-12-05: qty 6

## 2015-12-05 MED ORDER — ALBUTEROL SULFATE (2.5 MG/3ML) 0.083% IN NEBU: 3.0000 mL | INHALATION_SOLUTION | Freq: Four times a day (QID) | RESPIRATORY_TRACT | Status: DC | PRN

## 2015-12-05 MED ORDER — PANTOPRAZOLE SODIUM 40 MG PO TBEC
40.0000 mg | DELAYED_RELEASE_TABLET | Freq: Every day | ORAL | Status: DC
  Administered 2015-12-05 – 2015-12-08 (×4): 40 mg via ORAL
  Filled 2015-12-05 (×4): qty 1

## 2015-12-05 MED ORDER — HEPARIN SODIUM (PORCINE) 5000 UNIT/ML IJ SOLN
5000.0000 [IU] | Freq: Three times a day (TID) | INTRAMUSCULAR | Status: DC
  Administered 2015-12-05 – 2015-12-08 (×9): 5000 [IU] via SUBCUTANEOUS
  Filled 2015-12-05 (×11): qty 1

## 2015-12-05 NOTE — Evaluation (Signed)
Physical Therapy Evaluation [name] MRN: [id number] DOB: 1936/05/01 Today's Date: 12/05/2015   History of Present Illness  80 y.o. female with a past medical history significant for COPD, CHF, diabetes mellitus, HTN, obesity, and hyperlipidemia, COPD exacerbation/acute on chronic diastolic heart failure admitted with HCAP.  Clinical Impression  Pt admitted with above diagnosis. Pt currently with functional limitations due to the deficits listed below (see PT Problem List). Pt ambulated 39' with RW, SaO2 88-92% on room air with walking. Pt had no loss of balance, distance limited by fatigue and chronic LBP. Pt seems to be near baseline.  Pt will benefit from skilled PT to increase their independence and safety with mobility to allow discharge to the venue listed below.       Follow Up Recommendations No PT follow up    Equipment Recommendations  None recommended by PT    Recommendations for Other Services       Precautions / Restrictions Precautions Precautions: Fall Precaution Comments: 2 falls in past year Restrictions Weight Bearing Restrictions: No      Mobility  Bed Mobility Overal bed mobility: Needs Assistance Bed Mobility: Sit to Supine;Supine to Sit     Supine to sit: Min assist Sit to supine: Min assist   General bed mobility comments: min A to raise trunk for OOB, min A for BLEs into bed  Transfers Overall transfer level: Needs assistance Equipment used: Rolling walker (2 wheeled) Transfers: Sit to/from Stand Sit to Stand: Min guard         General transfer comment: min/guard for safety  Ambulation/Gait Ambulation/Gait assistance: Min guard Ambulation Distance (Feet): 80 Feet Assistive device: Rolling walker (2 wheeled) Gait Pattern/deviations: Step-through pattern;Trunk flexed   Gait velocity interpretation: Below normal speed for age/gender General Gait Details: steady with RW, distance limited by fatigue, SaO2 88-92% on RA  walking, no dyspnea  Stairs            Wheelchair Mobility    Modified Rankin (Stroke Patients Only)       Balance Overall balance assessment: History of Falls                                           Pertinent Vitals/Pain Pain Assessment: 0-10 Pain Score: 8  Pain Location: LBP (chronic) Pain Descriptors / Indicators: Aching Pain Intervention(s): Premedicated before session    Home Living Family/patient expects to be discharged to:: Private residence Living Arrangements: Other relatives   Type of Home: Apartment Home Access: Ramped entrance     Home Layout: One level Home Equipment: Walker - 4 wheels;Hand held shower head;Shower seat      Prior Function Level of Independence: Needs assistance   Gait / Transfers Assistance Needed: uses rollator independently at home  ADL's / Homemaking Assistance Needed: aide comes to assist with bathing        Hand Dominance        Extremity/Trunk Assessment   Upper Extremity Assessment: Overall WFL for tasks assessed           Lower Extremity Assessment: Overall WFL for tasks assessed (B knee extension -4/5)      Cervical / Trunk Assessment: Normal  Communication   Communication: No difficulties  Cognition Arousal/Alertness: Awake/alert Behavior During Therapy: WFL for tasks assessed/performed Overall Cognitive Status: Within Functional Limits for tasks assessed  General Comments      Exercises        Assessment/Plan    PT Assessment Patient needs continued PT services  PT Diagnosis Generalized weakness   PT Problem List Decreased activity tolerance;Decreased mobility;Obesity;Pain  PT Treatment Interventions Gait training;Functional mobility training;Therapeutic activities;Therapeutic exercise;Patient/family education;Balance training   PT Goals (Current goals can be found in the Care Plan section) Acute Rehab PT Goals Patient Stated Goal: to  return home PT Goal Formulation: With patient Time For Goal Achievement: 12/19/15 Potential to Achieve Goals: Good    Frequency Min 3X/week   Barriers to discharge        Co-evaluation               End of Session Equipment Utilized During Treatment: Gait belt Activity Tolerance: Patient tolerated treatment well;No increased pain Patient left: in bed;with call bell/phone within reach           Time: 1208-1238 PT Time Calculation (min) (ACUTE ONLY): 30 min   Charges:   PT Evaluation $PT Eval Moderate Complexity: 1 Procedure PT Treatments $Gait Training: 8-22 mins   PT G Codes:        Philomena Doheny 12/05/2015, 12:50 PM [phone]

## 2015-12-05 NOTE — Progress Notes (Signed)
I have seen and examined Ms Era at bedside and reviewed her chart. She was admitted earlier this morning by Dr Myna Hidalgo. Please refer to his comprehensive H&P for details. Michelle Buck is a pleasant 80 year old female who came in with cough and dyspnea and was found to have right lower lobe healthcare associated pneumonia. She is on broad-spectrum antibiotics. Reports feeling better. Will continue management per Dr Myna Hidalgo.

## 2015-12-05 NOTE — Progress Notes (Signed)
ANTIBIOTIC CONSULT NOTE - INITIAL  Pharmacy Consult for Vancomycin, aztreonam Indication: pneumonia  Allergies  Allergen Reactions  . Tramadol Other (See Comments)    Leg cramps   . Codeine Nausea And Vomiting    Patient states N/V with codeine  . Hydrocodone Nausea And Vomiting  . Oxycodone Other (See Comments)    Patient states she can tolerate oxycodone  . Tylenol [Acetaminophen] Hives  . Penicillins Rash    Patient states rash/itch with penicillin Has patient had a PCN reaction causing immediate rash, facial/tongue/throat swelling, SOB or lightheadedness with hypotension: Yes- broke me out and i was itching Has patient had a PCN reaction causing severe rash involving mucus membranes or skin necrosis: No Has patient had a PCN reaction that required hospitalization Yes- i was already in the hospital Has patient had a PCN reaction occurring within the last 10 years: No If all of the above answers are "NO    Patient Measurements:   Adjusted Body Weight:   Vital Signs: Temp: 97.9 F (36.6 C) (01/04 2120) Temp Source: Oral (01/04 2120) BP: 140/66 mmHg (01/05 0600) Pulse Rate: 70 (01/05 0600) Intake/Output from previous day:   Intake/Output from this shift:    Labs:  Recent Labs  12/04/15 2222  WBC 10.5  HGB 11.8*  PLT 330  CREATININE 2.55*   Estimated Creatinine Clearance: 20.7 mL/min (by C-G formula based on Cr of 2.55). No results for input(s): VANCOTROUGH, VANCOPEAK, VANCORANDOM, GENTTROUGH, GENTPEAK, GENTRANDOM, TOBRATROUGH, TOBRAPEAK, TOBRARND, AMIKACINPEAK, AMIKACINTROU, AMIKACIN in the last 72 hours.   Microbiology: No results found for this or any previous visit (from the past 720 hour(s)).  Medical History: Past Medical History  Diagnosis Date  . Diabetes mellitus   . Hypertension   . Obese   . Hiatal hernia   . Scoliosis   . Arthritis   . Acute bronchitis   . COPD (chronic obstructive pulmonary disease) ([place])   . Unspecified hereditary and  idiopathic peripheral neuropathy   . Secondary diabetes mellitus with renal manifestations, not stated as uncontrolled, or unspecified   . Chronic kidney disease, stage II (mild)   . Chronic kidney disease, stage II (mild)   . Chronic kidney disease, unspecified ([place])   . Disorder of bone and cartilage, unspecified   . Complications affecting other specified body systems, hypertension   . Hypopotassemia   . Anxiety   . Congestive heart failure, unspecified   . GERD (gastroesophageal reflux disease)   . Lumbago   . Insomnia, unspecified   . Hyperlipidemia   . Unspecified glaucoma   . Diaphragmatic hernia without mention of obstruction or gangrene   . Osteoarthrosis, unspecified whether generalized or localized, unspecified site   . Degenerative arthritis of knee, bilateral     Medications:  Anti-infectives    Start     Dose/Rate Route Frequency Ordered Stop   12/05/15 2200  vancomycin (VANCOCIN) IVPB 1000 mg/200 mL premix     1,000 mg 200 mL/hr over 60 Minutes Intravenous Every 24 hours 12/05/15 0235     12/05/15 0800  aztreonam (AZACTAM) 1 g in dextrose 5 % 50 mL IVPB     1 g 100 mL/hr over 30 Minutes Intravenous 3 times per day 12/05/15 0229 12/13/15 0559   12/05/15 0600  aztreonam (AZACTAM) 2 g in dextrose 5 % 50 mL IVPB  Status:  Discontinued     2 g 100 mL/hr over 30 Minutes Intravenous 3 times per day 12/05/15 0216 12/05/15 0228   12/05/15 0045  vancomycin (VANCOCIN) 1,500 mg in sodium chloride 0.9 % 500 mL IVPB     1,500 mg 250 mL/hr over 120 Minutes Intravenous  Once 12/05/15 0037 12/05/15 0445   12/05/15 0030  aztreonam (AZACTAM) 2 g in dextrose 5 % 50 mL IVPB     2 g 100 mL/hr over 30 Minutes Intravenous  Once 12/05/15 0016 12/05/15 0156     Assessment: Patient with PNA.  First dose of antibiotics already given in ED.  Goal of Therapy:  Vancomycin trough level 15-20 mcg/ml  Aztreonam dosed based on patient weight and renal function   Plan:  Measure antibiotic  drug levels at steady state Follow up culture results Vancomycin 1gm iv q24hr, after 1500mg  x1 in ED  Aztreonam 1gm iv q8hr  Tyler Deis, Renita Brocks [place] 12/05/2015,7:01 AM

## 2015-12-05 NOTE — H&P (Signed)
[facility] History and Physical  Michelle Buck [id number] DOB: May 14, 1936 DOA: 12/04/2015  Referring physician: ED physician PCP: [name], NP  Specialists:   Chief Complaint: Cough and dyspnea   HPI: Michelle Buck is a 80 y.o. female with PMH of insulin-dependent diabetes mellitus, hypertension, CAD, and COPD who presents to the ED with 1 week of worsening dyspnea and cough. Patient states that she had seen an outpatient provider earlier in the week and was given a short course of prednisone and azithromycin. She reports continued worsening despite this. She denies fevers, but endorses chills. She denies chest pains, palpitations, orthopnea, or worsening LE edema. Dyspnea is now present with minimal exertion, prompting her presentation to the ED.  In ED, patient was found to be afebrile, with poor O2 sat on room air initially, but vital signs otherwise stable. She was placed on supplemental oxygen with 2 L/m nasal cannula and O2 sats recovered. Chest x-ray was obtained and demonstrates a new density in the right lower chest consistent with pneumonia.  Patient was hospitalized within the last 3 months. Blood work was notable for a hypokalemia to 2.9 and a bump in the patient's serum creatinine from a baseline of 2.1, to 2.55 on admission. Blood cultures were obtained and the patient was given empiric doses of aztreonam and vancomycin in the emergency department and the hospitalists were called to admit.  Where does patient live?   At home     Can patient participate in ADLs?  Yes       Review of Systems:   General: CHills, but no fevers, sweats, weight change, poor appetite, or fatigue HEENT: no blurry vision, hearing changes or sore throat Pulm:  Dyspnea on exertion and non-productive cough, no wheeze CV: no chest pain or palpitations Abd: no nausea, vomiting, abdominal pain, diarrhea, or constipation GU: no dysuria, hematuria, increased urinary frequency, or urgency  Ext:  no leg edema Neuro: no focal weakness, numbness, or tingling, no vision change or hearing loss Skin: no rash, no wounds MSK: No muscle spasm, no deformity, no red, hot, or swollen joint Heme: No easy bruising or bleeding Travel history: No recent long distant travel    Allergy:  Allergies  Allergen Reactions  . Tramadol Other (See Comments)    Leg cramps   . Codeine Nausea And Vomiting    Patient states N/V with codeine  . Hydrocodone Nausea And Vomiting  . Oxycodone Other (See Comments)    Patient states she can tolerate oxycodone  . Tylenol [Acetaminophen] Hives  . Penicillins Rash    Patient states rash/itch with penicillin Has patient had a PCN reaction causing immediate rash, facial/tongue/throat swelling, SOB or lightheadedness with hypotension: Yes- broke me out and i was itching Has patient had a PCN reaction causing severe rash involving mucus membranes or skin necrosis: No Has patient had a PCN reaction that required hospitalization Yes- i was already in the hospital Has patient had a PCN reaction occurring within the last 10 years: No If all of the above answers are "NO    Past Medical History  Diagnosis Date  . Diabetes mellitus   . Hypertension   . Obese   . Hiatal hernia   . Scoliosis   . Arthritis   . Acute bronchitis   . COPD (chronic obstructive pulmonary disease) ([place])   . Unspecified hereditary and idiopathic peripheral neuropathy   . Secondary diabetes mellitus with renal manifestations, not stated as uncontrolled, or unspecified   . Chronic kidney  disease, stage II (mild)   . Chronic kidney disease, stage II (mild)   . Chronic kidney disease, unspecified ([place])   . Disorder of bone and cartilage, unspecified   . Complications affecting other specified body systems, hypertension   . Hypopotassemia   . Anxiety   . Congestive heart failure, unspecified   . GERD (gastroesophageal reflux disease)   . Lumbago   . Insomnia, unspecified   . Hyperlipidemia    . Unspecified glaucoma   . Diaphragmatic hernia without mention of obstruction or gangrene   . Osteoarthrosis, unspecified whether generalized or localized, unspecified site   . Degenerative arthritis of knee, bilateral     Past Surgical History  Procedure Laterality Date  . Abdominal hysterectomy    . Shoulder surgery    . Total hip arthroplasty      bilateral  . Knee arthroscopy      bilateral  . Colonoscopy  08/06/2011  . Foot surgery      Social History:  reports that she quit smoking about 18 years ago. Her smoking use included Cigarettes. She has never used smokeless tobacco. She reports that she does not drink alcohol or use illicit drugs.  Family History:  Family History  Problem Relation Age of Onset  . Diabetes Mother   . Heart disease Mother   . Heart disease Brother   . Diabetes Brother   . Kidney disease Father   . Scoliosis Sister   . Diabetes Brother   . Diabetes Brother   . Heart disease Brother   . Scoliosis Brother   . Stroke Brother   . Heart attack Brother      Prior to Admission medications   Medication Sig Start Date End Date Taking? Authorizing Provider  albuterol (PROVENTIL HFA;VENTOLIN HFA) 108 (90 BASE) MCG/ACT inhaler Inhale 1 puff into the lungs every 6 (six) hours as needed for wheezing or shortness of breath.   Yes Historical Provider, MD  AMITIZA 24 MCG capsule TAKE 1 CAPSULE BY MOUTH 2 TIMES A DAY WITH MEALS Patient taking differently: TAKE 24 MCG BY MOUTH 2 TIMES A DAY WITH MEALS 10/02/15  Yes [name], NP  aspirin EC 81 MG tablet Take 81 mg by mouth daily.   Yes Historical Provider, MD  budesonide (PULMICORT) 0.25 MG/2ML nebulizer solution Take 2 mLs (0.25 mg total) by nebulization 2 (two) times daily. 08/20/15  Yes [name], NP  cilostazol (PLETAL) 100 MG tablet TAKE 1 TABLET BY MOUTH EVERY DAY Patient taking differently: TAKE 100 MG BY MOUTH EVERY DAY 10/30/15  Yes [name], NP  cloNIDine (CATAPRES) 0.1 MG  tablet TAKE 1 TABLET BY MOUTH EVERY DAY Patient taking differently: TAKE 0.1 MG BY MOUTH EVERY DAY 02/07/15  Yes Mahima Pandey, MD  cycloSPORINE (RESTASIS) 0.05 % ophthalmic emulsion Place 1 drop into both eyes 2 (two) times daily. One drop once a day bilaterally for dry eyes   Yes Historical Provider, MD  DULoxetine (CYMBALTA) 30 MG capsule TAKE 1 CAPSULE BY MOUTH EVERY DAY TO HELP ANXIETY AND PAINS Patient taking differently: TAKE 30 MG BY MOUTH EVERY DAY TO HELP ANXIETY AND PAINS 10/30/15  Yes [name], NP  EASY TOUCH INSULIN SYRINGE 31G X 5/16" 0.5 ML MISC USE UP TO 2 TIMES A DAY AS DIRECTED 11/28/15  Yes [name], NP  furosemide (LASIX) 40 MG tablet Take 1 tablet (40 mg total) by mouth 2 (two) times daily. 10/29/15  Yes [name], NP  gabapentin (NEURONTIN) 100 MG capsule TAKE 2 CAPSULES BY MOUTH THREE TIMES A DAY Patient taking differently: TAKE 200 MG BY MOUTH THREE TIMES A DAY 10/02/15  Yes [name], NP  hydrALAZINE (APRESOLINE) 50 MG tablet TAKE 1 TABLET BY MOUTH TWICE DAILY Patient taking differently: TAKE 50 MG BY MOUTH TWICE DAILY 09/02/15  Yes [name], NP  HYDROcodone-acetaminophen (NORCO) 10-325 MG tablet Take 1 tablet by mouth every 6 (six) hours as needed for moderate pain. 10/29/15  Yes [name], NP  insulin NPH-regular Human (HUMULIN 70/30) (70-30) 100 UNIT/ML injection Inject 40 units subcutaneously with breakfast and 20 units with dinner to control blood sugar. 10/29/15  Yes [name], NP  ipratropium-albuterol (DUONEB) 0.5-2.5 (3) MG/3ML SOLN Take 3 mLs by nebulization every 4 (four) hours as needed. Patient taking differently: Take 3 mLs by nebulization every 4 (four) hours as needed (SOB, wheezing).  08/21/15  Yes Benjamin Cartner, PA-C  magnesium oxide (MAG-OX) 400 MG tablet Take 1 tablet (400 mg total) by mouth 2 (two) times daily. 11/06/15  Yes [name], NP  metoprolol succinate (TOPROL-XL) 25 MG 24 hr tablet  TAKE 1 TABLET BY MOUTH EVERY DAY WITH OR IMMEDIATELY FOLLOWING A MEAL FOR BLOOD PRESSURE Patient taking differently: TAKE 25 MG BY MOUTH EVERY DAY WITH OR IMMEDIATELY FOLLOWING A MEAL FOR BLOOD PRESSURE 07/24/15  Yes [name], NP  NEXIUM 40 MG capsule TAKE 1 CAPSULE BY MOUTH ONCE DAILY Patient taking differently: TAKE 40 MG BY MOUTH ONCE DAILY 10/30/15  Yes [name], NP  potassium chloride SA (K-DUR,KLOR-CON) 20 MEQ tablet TAKE 1 TABLET BY MOUTH EVERY DAY Patient taking differently: TAKE 20 MEQ BY MOUTH EVERY DAY 09/02/15  Yes [name], NP  predniSONE (DELTASONE) 20 MG tablet Take 2 tablets (40 mg total) by mouth daily with breakfast. 11/29/15  Yes Skeet Latch, MD  simvastatin (ZOCOR) 20 MG tablet TAKE 1 TABLET BY MOUTH EVERY DAY Patient taking differently: TAKE 20 MG BY MOUTH EVERY DAY 07/24/15  Yes [name], NP  sucralfate (CARAFATE) 1 g tablet TAKE 1 TABLET BY MOUTH TWICE DAILY ON EMPTY STOMACH Patient taking differently: TAKE 1 G BY MOUTH TWICE DAILY ON EMPTY STOMACH 11/28/15  Yes [name], NP  timolol (TIMOPTIC) 0.5 % ophthalmic solution Place 1 drop into both eyes 2 (two) times daily. One drop in bilaterally twice a day as needed   Yes Historical Provider, MD  Travoprost, BAK Free, (TRAVATAN) 0.004 % SOLN ophthalmic solution Place 1 drop into both eyes at bedtime. One drop bilaterally twice a day   Yes Historical Provider, MD  VOLTAREN 1 % GEL APPLY 4 GRAM TOPICALLY TWICE DAILY 10/02/15  Yes [name], NP  azithromycin (ZITHROMAX Z-PAK) 250 MG tablet Take 2 tablets today and 1 tablet for four days Patient not taking: Reported on 12/05/2015 11/29/15   Skeet Latch, MD    Physical Exam: Filed Vitals:   12/05/15 0055 12/05/15 0100 12/05/15 0122 12/05/15 0129  BP:  159/85    Pulse:  74    Temp:      TempSrc:      Resp:  19    SpO2: 85% 91% 97% 98%   General: Mild respiratory distress, dyspneic between 3-4 words HEENT:       Eyes:  PERRL, EOMI, no scleral icterus or conjunctival pallor.       ENT: No discharge from the ears or nose, no pharyngeal ulcers, petechiae or exudate.  Neck: No JVD, no bruit, no appreciable mass Heme: No cervical adenopathy, no pallor Cardiac: Rate ~60 and regular, unable to appreciate any r/m/g Pulm: Tachypneic, using neck muscles, good b/l air movement, coarse ronchi over b/l mid-low lung fields. Abd: Soft, nondistended, nontender, no rebound pain or gaurding, no mass or organomegaly, BS present. Ext: 1+ pretibial pitting edema b/l, trace pedal pulses, foot warm, well-perfused Musculoskeletal: No gross deformity, no red, hot, swollen joints   Skin: No rashes or wounds on exposed surfaces  Neuro: Alert, oriented X3, cranial nerves II-XII grossly intact. No focal findings Psych: Patient is not overtly psychotic, appropriate mood and affect.  Labs on Admission:  Basic Metabolic Panel:  Recent Labs Lab 12/04/15 2222  NA 141  K 2.9*  CL 102  CO2 27  GLUCOSE 164*  BUN 42*  CREATININE 2.55*  CALCIUM 9.3   Liver Function Tests: No results for input(s): AST, ALT, ALKPHOS, BILITOT, PROT, ALBUMIN in the last 168 hours. No results for input(s): LIPASE, AMYLASE in the last 168 hours. No results for input(s): AMMONIA in the last 168 hours. CBC:  Recent Labs Lab 12/04/15 2222  WBC 10.5  HGB 11.8*  HCT 37.5  MCV 78.3  PLT 330   Cardiac Enzymes: No results for input(s): CKTOTAL, CKMB, CKMBINDEX, TROPONINI in the last 168 hours.  BNP (last 3 results)  Recent Labs  08/12/15 0454 08/21/15 1743 09/10/15 1922  BNP 149.2* 24.6 29.8    ProBNP (last 3 results) No results for input(s): PROBNP in the last 8760 hours.  CBG: No results for input(s): GLUCAP in the last 168 hours.  Radiological Exams on Admission: Dg Chest 2 View  12/04/2015  CLINICAL DATA:  Cough and congestion for 2 weeks. Shortness of breath. EXAM: CHEST  2 VIEW COMPARISON:  09/10/2015 FINDINGS: Chronic  eventration along the anterior right hemidiaphragm. Mild enlargement of cardiac silhouette appears unchanged. Retrocardiac opacity is compatible with a hiatal hernia. There are linear densities in the right lower lung. No focal airspace disease in the left lung. Degenerative changes in the left shoulder joint. IMPRESSION: New densities in the right lower chest. Findings could represent focus of infection and/or atelectasis. Followup PA and lateral chest X-ray is recommended in 3-4 weeks following trial of antibiotic therapy to ensure resolution and exclude underlying malignancy. Electronically Signed   By: Markus Daft M.D.   On: 12/04/2015 22:11    EKG: Independently reviewed.  Abnormal findings:   Sinus, RAD, low-voltage, no significant change from prior   Assessment/Plan  1. HCAP  - Treating empirically with aztreonam and vancomycin while awaiting results of cultures microbiology w/u  - Will likely stop vancomycin if MRSA carrier screen returns neg - Blood cultures pending, sputum culture sent  - Check urine antigen for strep pneumo, legionella  - Continuous pulse oximetry  - Check procalcitonin, lactate    2. Chronic diastolic CHF  - TTE ([id number]) with EF 60-65%, not adequate for assess diastolic dysfunction, but on earlier study from 2013, grade II diastolic dysfunction documented  - Clinically, pt is euvolemic  - Will hold her home Lasix for now in setting of HCAP and risk of developing sepsis; tentatively plan to resume tonight if appropriate  - Continue metoprolol, ASA, statin, consider adding ACEi/ARB  - Daily wts, strict I/Os   3. COPD   - Not requiring supplemental O2 at home  - Current O2 requirement suspected secondary to HCAP, and pt can likely be weaned to rm air quickly  - Conitnue home  regimen of DuoNebs prn, Symbicort  - Holding steroids given active infection   4. Insulin-dependent DM with neuropathy - Holding home insulins, using ~ 60 units/day at home  - Will check  CBGs QID, start with Lantus 20 units qHS and SSI correctional  - Will likely need to increase basal, monitoring    5. ASCVD  - Initial trop reassuring, no acute ischemic change on EKG  - Continue ASA 81, statin, metoprolol  - Will continue home-dose cilostazol for PAD   6. Hypokalemia  - Check mag  - Replacing IV, PO  - Repeat chem panel in am     DVT ppx: SQ Heparin     Code Status: DNR Family Communication: None at bed side.          Disposition Plan: Admit to inpatient   Date of Service 12/05/2015    Vianne Bulls, MD [facility] Pager [phone]  If 7PM-7AM, please contact night-coverage [email or web address] Password [email or web address] 12/05/2015, 2:18 AM

## 2015-12-05 NOTE — ED Provider Notes (Signed)
CSN: [id number]     Arrival date & time 12/04/15  2103 History  By signing my name below, I, Soijett Blue, attest that this documentation has been prepared under the direction and in the presence of Carmin Muskrat, MD. Electronically Signed: Soijett Blue, ED Scribe. 12/05/2015. 12:07 AM.   Chief Complaint  Patient presents with  . Shortness of Breath  . Cough      The history is provided by the patient. No language interpreter was used.    Michelle Buck is a 80 y.o. female with a medical hx of COPD, DM, HTN, who presents to the Emergency Department complaining of productive cough onset 1 week. She notes that she was seen at a provider at [facility] with cold like symptoms and was given a breathing treatment and Rx steroids and abx which has not alleviated her symptoms.  Pt is having associated symptoms of congestion, nausea, CP due to cough, SOB, wheezing. She reports that she has tried using her nebulizer with albuterol breathing treatment x 4 times daily with no relief of her symptoms. She denies vomiting, fever, and any other symptoms. Denies dosage change in her lasix at this time. Pt was last hospitalized in September 2016 and she lives at home with family.   PCP: [name], NP  Past Medical History  Diagnosis Date  . Diabetes mellitus   . Hypertension   . Obese   . Hiatal hernia   . Scoliosis   . Arthritis   . Acute bronchitis   . COPD (chronic obstructive pulmonary disease) ([place])   . Unspecified hereditary and idiopathic peripheral neuropathy   . Secondary diabetes mellitus with renal manifestations, not stated as uncontrolled, or unspecified   . Chronic kidney disease, stage II (mild)   . Chronic kidney disease, stage II (mild)   . Chronic kidney disease, unspecified ([place])   . Disorder of bone and cartilage, unspecified   . Complications affecting other specified body systems, hypertension   . Hypopotassemia   . Anxiety   . Congestive heart failure,  unspecified   . GERD (gastroesophageal reflux disease)   . Lumbago   . Insomnia, unspecified   . Hyperlipidemia   . Unspecified glaucoma   . Diaphragmatic hernia without mention of obstruction or gangrene   . Osteoarthrosis, unspecified whether generalized or localized, unspecified site   . Degenerative arthritis of knee, bilateral    Past Surgical History  Procedure Laterality Date  . Abdominal hysterectomy    . Shoulder surgery    . Total hip arthroplasty      bilateral  . Knee arthroscopy      bilateral  . Colonoscopy  08/06/2011  . Foot surgery     Family History  Problem Relation Age of Onset  . Diabetes Mother   . Heart disease Mother   . Heart disease Brother   . Diabetes Brother   . Kidney disease Father   . Scoliosis Sister   . Diabetes Brother   . Diabetes Brother   . Heart disease Brother   . Scoliosis Brother   . Stroke Brother   . Heart attack Brother    Social History  Substance Use Topics  . Smoking status: Former Smoker    Types: Cigarettes    Quit date: 02/27/1997  . Smokeless tobacco: Never Used  . Alcohol Use: No   OB History    No data available     Review of Systems  Constitutional:       Per HPI,  otherwise negative  HENT:       Per HPI, otherwise negative  Respiratory: Positive for cough and shortness of breath.        Per HPI, otherwise negative  Cardiovascular:       Per HPI, otherwise negative  Gastrointestinal: Negative for vomiting.  Endocrine:       Negative aside from HPI  Genitourinary:       Neg aside from HPI   Musculoskeletal:       Per HPI, otherwise negative  Skin: Negative.   Neurological: Negative for syncope.      Allergies  Tramadol; Codeine; Hydrocodone; Oxycodone; Tylenol; and Penicillins  Home Medications   Prior to Admission medications   Medication Sig Start Date End Date Taking? Authorizing Provider  albuterol (PROVENTIL HFA;VENTOLIN HFA) 108 (90 BASE) MCG/ACT inhaler Inhale 1 puff into the lungs  every 6 (six) hours as needed for wheezing or shortness of breath.    Historical Provider, MD  AMITIZA 24 MCG capsule TAKE 1 CAPSULE BY MOUTH 2 TIMES A DAY WITH MEALS 10/02/15   [name], NP  aspirin EC 81 MG tablet Take 81 mg by mouth daily.    Historical Provider, MD  azithromycin (ZITHROMAX Z-PAK) 250 MG tablet Take 2 tablets today and 1 tablet for four days 11/29/15   Skeet Latch, MD  budesonide (PULMICORT) 0.25 MG/2ML nebulizer solution Take 2 mLs (0.25 mg total) by nebulization 2 (two) times daily. 08/20/15   [name], NP  cilostazol (PLETAL) 100 MG tablet TAKE 1 TABLET BY MOUTH EVERY DAY 10/30/15   [name], NP  cloNIDine (CATAPRES) 0.1 MG tablet TAKE 1 TABLET BY MOUTH EVERY DAY 02/07/15   Blanchie Serve, MD  cycloSPORINE (RESTASIS) 0.05 % ophthalmic emulsion Place 1 drop into both eyes 2 (two) times daily. One drop once a day bilaterally for dry eyes    Historical Provider, MD  DULoxetine (CYMBALTA) 30 MG capsule TAKE 1 CAPSULE BY MOUTH EVERY DAY TO HELP ANXIETY AND PAINS 10/30/15   [name], NP  EASY [facility] INSULIN SYRINGE 31G X 5/16" 0.5 ML MISC USE UP TO 2 TIMES A DAY AS DIRECTED 11/28/15   [name], NP  furosemide (LASIX) 40 MG tablet Take 1 tablet (40 mg total) by mouth 2 (two) times daily. 10/29/15   [name], NP  gabapentin (NEURONTIN) 100 MG capsule TAKE 2 CAPSULES BY MOUTH THREE TIMES A DAY 10/02/15   [name], NP  hydrALAZINE (APRESOLINE) 50 MG tablet TAKE 1 TABLET BY MOUTH TWICE DAILY 09/02/15   [name], NP  HYDROcodone-acetaminophen (NORCO) 10-325 MG tablet Take 1 tablet by mouth every 6 (six) hours as needed for moderate pain. 10/29/15   [name], NP  insulin NPH-regular Human (HUMULIN 70/30) (70-30) 100 UNIT/ML injection Inject 40 units subcutaneously with breakfast and 20 units with dinner to control blood sugar. 10/29/15   [name], NP  ipratropium-albuterol (DUONEB) 0.5-2.5 (3) MG/3ML SOLN  Take 3 mLs by nebulization every 4 (four) hours as needed. 08/21/15   Comer Locket, PA-C  magnesium oxide (MAG-OX) 400 MG tablet Take 1 tablet (400 mg total) by mouth 2 (two) times daily. 11/06/15   [name], NP  metoprolol succinate (TOPROL-XL) 25 MG 24 hr tablet TAKE 1 TABLET BY MOUTH EVERY DAY WITH OR IMMEDIATELY FOLLOWING A MEAL FOR BLOOD PRESSURE 07/24/15   [name], NP  NEXIUM 40 MG capsule TAKE 1 CAPSULE BY MOUTH ONCE DAILY  10/30/15   [name], NP  potassium chloride SA (K-DUR,KLOR-CON) 20 MEQ tablet TAKE 1 TABLET BY MOUTH EVERY DAY 09/02/15   [name], NP  predniSONE (DELTASONE) 20 MG tablet Take 2 tablets (40 mg total) by mouth daily with breakfast. 11/29/15   Skeet Latch, MD  simvastatin (ZOCOR) 20 MG tablet TAKE 1 TABLET BY MOUTH EVERY DAY 07/24/15   [name], NP  sucralfate (CARAFATE) 1 g tablet TAKE 1 TABLET BY MOUTH TWICE DAILY ON EMPTY STOMACH 11/28/15   [name], NP  timolol (TIMOPTIC) 0.5 % ophthalmic solution Place 1 drop into both eyes 2 (two) times daily. One drop in bilaterally twice a day as needed    Historical Provider, MD  Travoprost, BAK Free, (TRAVATAN) 0.004 % SOLN ophthalmic solution Place 1 drop into both eyes at bedtime. One drop bilaterally twice a day    Historical Provider, MD  VOLTAREN 1 % GEL APPLY 4 GRAM TOPICALLY TWICE DAILY 10/02/15   [name], NP   BP 129/67 mmHg  Pulse 68  Temp(Src) 97.9 F (36.6 C) (Oral)  Resp 18  SpO2 94% Physical Exam  Constitutional: She is oriented to person, place, and time. She appears well-developed and well-nourished. No distress.  HENT:  Head: Normocephalic and atraumatic.  Eyes: Conjunctivae and EOM are normal.  Cardiovascular: Normal rate and regular rhythm.   Pulmonary/Chest: No stridor. No respiratory distress. She has decreased breath sounds. She has wheezes.  Abdominal: She exhibits no distension.  Musculoskeletal: She exhibits no edema.   Neurological: She is alert and oriented to person, place, and time. No cranial nerve deficit.  Skin: Skin is warm and dry.  Psychiatric: She has a normal mood and affect.  Nursing note and vitals reviewed.   ED Course  Procedures (including critical care time) DIAGNOSTIC STUDIES: Oxygen Saturation is 94% on RA, adequate by my interpretation.    COORDINATION OF CARE: 12:07 AM Discussed treatment plan with pt at bedside which includes labs, EKG, CXR, and pt agreed to plan.    Labs Review Labs Reviewed  BASIC METABOLIC PANEL - Abnormal; Notable for the following:    Potassium 2.9 (*)    Glucose, Bld 164 (*)    BUN 42 (*)    Creatinine, Ser 2.55 (*)    GFR calc non Af Amer 17 (*)    GFR calc Af Amer 20 (*)    All other components within normal limits  CBC - Abnormal; Notable for the following:    Hemoglobin 11.8 (*)    MCH 24.6 (*)    RDW 16.6 (*)    All other components within normal limits  BRAIN NATRIURETIC PEPTIDE  I-STAT TROPOININ, ED    Imaging Review Dg Chest 2 View  12/04/2015  CLINICAL DATA:  Cough and congestion for 2 weeks. Shortness of breath. EXAM: CHEST  2 VIEW COMPARISON:  09/10/2015 FINDINGS: Chronic eventration along the anterior right hemidiaphragm. Mild enlargement of cardiac silhouette appears unchanged. Retrocardiac opacity is compatible with a hiatal hernia. There are linear densities in the right lower lung. No focal airspace disease in the left lung. Degenerative changes in the left shoulder joint. IMPRESSION: New densities in the right lower chest. Findings could represent focus of infection and/or atelectasis. Followup PA and lateral chest X-ray is recommended in 3-4 weeks following trial of antibiotic therapy to ensure resolution and exclude underlying malignancy. Electronically Signed   By: Markus Daft M.D.   On: 12/04/2015 22:11   I have  personally reviewed and evaluated these images and lab results as part of my medical decision-making.   EKG  Interpretation   Date/Time:  Wednesday December 04 2015 23:55:31 EST Ventricular Rate:  75 PR Interval:  135 QRS Duration: 98 QT Interval:  361 QTC Calculation: 403 R Axis:   99 Text Interpretation:  Sinus rhythm Right axis deviation Low voltage,  precordial leads Nonspecific repol abnormality, diffuse leads inferior  lateral depression c/w old Confirmed by Johnney Killian, MD, Jeannie Done [phone]) on  12/05/2015 12:05:59 AM      MDM   I personally performed the services described in this documentation, which was scribed in my presence. The recorded information has been reviewed and is accurate.    Elderly female with multiple medical issues, including CHF, CAD, COPD now presents with ongoing cough, congestion, no improvement with azithromycin, steroids. X-ray concerning for pneumonia. No evidence for sepsis. Patient received broad spectrum antibiotics, was admitted for further evaluation and management.  Carmin Muskrat, MD 12/05/15 [phone]

## 2015-12-05 NOTE — Progress Notes (Signed)
8:20a- CSW attempted to speak with patient. Nurse at bedside.  54:30a- CSW staffed with nurse. CSW spoke with patient at bedside. Patient was oriented and pleasant during the conversation. Patient stated she has encountered a lot of sickness throughout her life. Patient stated due to sickness, she has not worked much through life. Patient stated she resides with her brother, Benedetto Coons who cares for her in the home. Patient stated she has other brothers and sisters that come from time to time.Patient stated she does not remember his contact number.   Patient stated she has an aid from [place] that comes to the home Monday through Friday from 10:00 to 1:00p. Patient stated the aid assist her with bathing and dressing. Patient stated her brother cooks and prepares meals. Patient inquired about having a hospital bed in her home. Patient stated she has a doctor's appointment on January 10th and she will speak with her physician as well regarding the bed.   Patient had no other questions for CSW at this time.  Michelle Buck [id number] ED CSW 12/05/2015 11:16 AM

## 2015-12-06 DIAGNOSIS — J189 Pneumonia, unspecified organism: Secondary | ICD-10-CM

## 2015-12-06 LAB — BASIC METABOLIC PANEL
Anion gap: 8 (ref 5–15)
BUN: 38 mg/dL — AB (ref 6–20)
CALCIUM: 9 mg/dL (ref 8.9–10.3)
CO2: 28 mmol/L (ref 22–32)
Chloride: 106 mmol/L (ref 101–111)
Creatinine, Ser: 2.05 mg/dL — ABNORMAL HIGH (ref 0.44–1.00)
GFR calc Af Amer: 26 mL/min — ABNORMAL LOW (ref >60)
GFR, EST NON AFRICAN AMERICAN: 22 mL/min — AB (ref >60)
GLUCOSE: 171 mg/dL — AB (ref 65–99)
Potassium: 4.1 mmol/L (ref 3.5–5.1)
Sodium: 142 mmol/L (ref 135–145)

## 2015-12-06 LAB — LEGIONELLA ANTIGEN, URINE

## 2015-12-06 LAB — GLUCOSE, CAPILLARY
GLUCOSE-CAPILLARY: 123 mg/dL — AB (ref 65–99)
GLUCOSE-CAPILLARY: 172 mg/dL — AB (ref 65–99)
Glucose-Capillary: 166 mg/dL — ABNORMAL HIGH (ref 65–99)
Glucose-Capillary: 176 mg/dL — ABNORMAL HIGH (ref 65–99)

## 2015-12-06 LAB — HEMOGLOBIN A1C
HEMOGLOBIN A1C: 7.6 % — AB (ref 4.8–5.6)
MEAN PLASMA GLUCOSE: 171 mg/dL

## 2015-12-06 LAB — MRSA PCR SCREENING: MRSA BY PCR: NEGATIVE

## 2015-12-06 MED ORDER — LEVOFLOXACIN IN D5W 750 MG/150ML IV SOLN: 750.0000 mg | INTRAVENOUS | Status: DC

## 2015-12-06 MED ORDER — SENNOSIDES-DOCUSATE SODIUM 8.6-50 MG PO TABS
2.0000 | ORAL_TABLET | Freq: Every evening | ORAL | Status: DC | PRN
  Administered 2015-12-06: 2 via ORAL
  Filled 2015-12-06: qty 2

## 2015-12-06 MED ORDER — ALUM & MAG HYDROXIDE-SIMETH 200-200-20 MG/5ML PO SUSP
30.0000 mL | Freq: Four times a day (QID) | ORAL | Status: DC | PRN
  Administered 2015-12-06: 30 mL via ORAL
  Filled 2015-12-06: qty 30

## 2015-12-06 MED ORDER — LEVOFLOXACIN 750 MG PO TABS
750.0000 mg | ORAL_TABLET | ORAL | Status: DC
  Administered 2015-12-08: 750 mg via ORAL
  Filled 2015-12-06: qty 1

## 2015-12-06 MED ORDER — LEVOFLOXACIN IN D5W 750 MG/150ML IV SOLN
750.0000 mg | Freq: Once | INTRAVENOUS | Status: AC
  Administered 2015-12-06: 750 mg via INTRAVENOUS
  Filled 2015-12-06: qty 150

## 2015-12-06 NOTE — Progress Notes (Signed)
Physical Therapy Treatment [name] MRN: [id number] DOB: [birth year] Today's Date: 2015/12/21    History of Present Illness 80 y.o. female with a past medical history significant for COPD, CHF, diabetes mellitus, HTN, obesity, and hyperlipidemia, COPD exacerbation/acute on chronic diastolic heart failure admitted with HCAP.    PT Comments    Pt pleasant and cooperative.  Pt ambulated in hallway and did not require supplemental oxygen.  Follow Up Recommendations  No PT follow up     Equipment Recommendations  None recommended by PT    Recommendations for Other Services       Precautions / Restrictions Precautions Precautions: Fall    Mobility  Bed Mobility               General bed mobility comments: pt up in recliner  Transfers Overall transfer level: Needs assistance Equipment used: Rolling walker (2 wheeled) Transfers: Sit to/from Stand Sit to Stand: Min guard         General transfer comment: for safety  Ambulation/Gait Ambulation/Gait assistance: Supervision;Min guard Ambulation Distance (Feet): 140 Feet Assistive device: Rolling walker (2 wheeled) Gait Pattern/deviations: Step-through pattern;Trunk flexed;Decreased stride length     General Gait Details: min/guard in room without assistive device but improved steadiness with RW in hallway, SpO2 89-93% room air during gait, no dyspnea   Stairs            Wheelchair Mobility    Modified Rankin (Stroke Patients Only)       Balance                                    Cognition Arousal/Alertness: Awake/alert Behavior During Therapy: WFL for tasks assessed/performed Overall Cognitive Status: Within Functional Limits for tasks assessed                      Exercises      General Comments        Pertinent Vitals/Pain Pain Assessment: 0-10 Pain Score: 4  Pain Location: back, legs Pain Descriptors / Indicators: Aching Pain  Intervention(s): Limited activity within patient's tolerance;Monitored during session    Home Living                      Prior Function            PT Goals (current goals can now be found in the care plan section) Progress towards PT goals: Progressing toward goals    Frequency  Min 3X/week    PT Plan Current plan remains appropriate    Co-evaluation             End of Session Equipment Utilized During Treatment: Gait belt Activity Tolerance: Patient tolerated treatment well;No increased pain Patient left: with call bell/phone within reach;in chair     Time: [id number] PT Time Calculation (min) (ACUTE ONLY): 11 min  Charges:  $Gait Training: 8-22 mins                    G Codes:      Alexander Aument,KATHrine E 2015/12/21, 12:19 PM Carmelia Bake, PT, DPT December 21, 2015 Pager: [phone]

## 2015-12-06 NOTE — Care Management Note (Signed)
Case Management Note  Patient Details  Name: Michelle Buck MRN: [id number] Date of Birth: 1936-09-10  Subjective/Objective:                    Action/Plan:   Expected Discharge Date:   (UNKNOWN)               Expected Discharge Plan:  [place]  In-House Referral:     Discharge planning Services  CM Consult  Post Acute Care Choice:    Choice offered to:  Patient  DME Arranged:  Hospital bed DME Agency:  [place]:  RN, PT [facility] Agency:  Other - See comment  Status of Service:  In process, will continue to follow  Medicare Important Message Given:    Date Medicare IM Given:    Medicare IM give by:    Date Additional Medicare IM Given:    Additional Medicare Important Message give by:     If discussed at [place] of Stay Meetings, dates discussed:    Additional CommentsPurcell Mouton, RN 12/06/2015, 3:20 PM

## 2015-12-06 NOTE — Progress Notes (Signed)
Pt has [place] at home and her bother lives with her. There are no other HH needs at present time.

## 2015-12-06 NOTE — Progress Notes (Signed)
Initial Nutrition Assessment  DOCUMENTATION CODES:   Morbid obesity  INTERVENTION:  - RD will continue to monitor for needs  NUTRITION DIAGNOSIS:   Inadequate oral intake related to acute illness as evidenced by per patient/family report.  GOAL:   Patient will meet greater than or equal to 90% of their needs  MONITOR:   PO intake, Weight trends, Labs, I & O's  REASON FOR ASSESSMENT:   Consult Assessment of nutrition requirement/status  ASSESSMENT:   80 y.o. female with PMH of insulin-dependent diabetes mellitus, hypertension, CAD, and COPD who presents to the ED with 1 week of worsening dyspnea and cough. Patient states that she had seen an outpatient provider earlier in the week and was given a short course of prednisone and azithromycin. She reports continued worsening despite this. She denies fevers, but endorses chills. She denies chest pains, palpitations, orthopnea, or worsening LE edema. Dyspnea is now present with minimal exertion, prompting her presentation to the ED.  Pt seen for consult. BMI indicates morbid obesity. Pt ate 75% of breakfast this AM and states that she ate carrots, string beans, and fish for lunch but that after this meal she developed heart burn. Per notes, pt was experiencing nausea PTA. Pt states that this was ongoing x1 week PTA and that she experienced slight nausea this AM which has since resolved. She states that nauseous feeling caused her to vomit a few times PTA but she has not had any episodes of emesis since admission. Pt indicates that during 1 week PTA she was only able to eat a few bites throughout the day due to feeling nauseated and vomiting.   Physical assessment shows no signs of muscle or fat wasting, mild edema present. Pt states that her weight has been stable recently. Chart review indicates that pt has lost 4 lbs (2% body weight) in the past 1-1.5 months which is not significant for time frame; will continue to monitor weight trends  during admission.  Not meeting needs currently; will monitor intakes and need for intervention at follow-up. Medications reviewed. Labs reviewed; CBGs: 123-253 mg/dL, BUN/creatinine elevated but trending down, GFR: 26.   Diet Order:  Diet renal/carb modified with fluid restriction Diet-HS Snack?: Nothing; Room service appropriate?: Yes; Fluid consistency:: Thin; Fluid restriction:: 1200 mL Fluid  Skin:  Reviewed, no issues  Last BM:  PTA  Height:   Ht Readings from Last 1 Encounters:  12/05/15 5\' 2"  (1.575 m)    Weight:   Wt Readings from Last 1 Encounters:  12/06/15 224 lb 10.4 oz (101.9 kg)    Ideal Body Weight:  50 kg (kg)  BMI:  Body mass index is 41.08 kg/(m^2).  Estimated Nutritional Needs:   Kcal:  1400-1600  Protein:  75-85 grams  Fluid:  1.8-2 L/day  EDUCATION NEEDS:   No education needs identified at this time     Jarome Matin, RD, LDN Inpatient Clinical Dietitian Pager # [phone] After hours/weekend pager # [phone]

## 2015-12-06 NOTE — Consult Note (Signed)
   [facility] CM Inpatient Consult   12/06/2015  Michelle Buck 1936/03/21 [id number]   EPIC referral for [facility] services. Spoke with patient at bedside to explain and offer [place] [facility] program. She pleasantly declines [facility] RNCM follow up at this time stating that she would like to speak with her sister about it first. She endorses that she is already signed up "for so much" that she does not need the additional services. She is agreeable to EMMI pneumonia telephone calls however. Will request for her to be assigned to the EMMI pneumonia program. Will make inpatient RNCM aware that patient declined [facility] RNCM follow up at this time. Brochure and contact information was left at bedside.   Marthenia Rolling, MSN-Ed, RN,BSN [facility] Liaison [phone]

## 2015-12-06 NOTE — Evaluation (Signed)
Occupational Therapy Evaluation [name] MRN: [id number] DOB: 1935-12-19 Today's Date: 12/06/2015    History of Present Illness 80 y.o. female with a past medical history significant for COPD, CHF, diabetes mellitus, HTN, obesity, and hyperlipidemia, COPD exacerbation/acute on chronic diastolic heart failure admitted with HCAP.   Clinical Impression   Pt was admitted for the above. She is very motivated and at baseline for ADLs except for toilet transfers.  Will address this as well as UE strenghtening/ROM program for bil UEs    Follow Up Recommendations  Supervision/Assistance - 24 hour    Equipment Recommendations  None recommended by OT    Recommendations for Other Services       Precautions / Restrictions Precautions Precautions: Fall Precaution Comments: 2 falls in past year Restrictions Weight Bearing Restrictions: No      Mobility Bed Mobility         Supine to sit: Supervision     General bed mobility comments: HOB raised  Transfers     Transfers: Sit to/from Stand Sit to Stand: Min assist         General transfer comment: for safety    Balance                                            ADL Overall ADL's : At baseline (except toilet transfers)                                       General ADL Comments: pt currently needs min guard for transfers. She had assistance with adls prior to admission and is at baseline. Encouraged pt to move arms within pain tolerance for strength/ROM     Vision     Perception     Praxis      Pertinent Vitals/Pain Pain Score: 6  Pain Location: back, legs, shoulders Pain Descriptors / Indicators: Aching Pain Intervention(s): Limited activity within patient's tolerance;Monitored during session;Repositioned (wants to wait on pain medication; states it will ease off)     Hand Dominance     Extremity/Trunk Assessment Upper Extremity Assessment Upper  Extremity Assessment: RUE deficits/detail;LUE deficits/detail RUE Deficits / Details: can lift to 90 then gets painful LUE Deficits / Details: can only lift to 50; fell on this shoulder; painful           Communication Communication Communication: No difficulties   Cognition Arousal/Alertness: Awake/alert Behavior During Therapy: WFL for tasks assessed/performed Overall Cognitive Status: Within Functional Limits for tasks assessed                     General Comments       Exercises       Shoulder Instructions      Home Living Family/patient expects to be discharged to:: Private residence Living Arrangements: Other relatives (brother) Available Help at Discharge: Family;Personal care attendant Type of Home: Apartment             Bathroom Shower/Tub: Teacher, early years/pre: Handicapped height     Home Equipment: Environmental consultant - 4 wheels;Hand held shower head;Shower seat;Grab bars - toilet          Prior Functioning/Environment Level of Independence: Needs assistance        Comments: assistance with bathing/dressing. Gets to bathroom  on her own with rollator    OT Diagnosis: Acute pain;Generalized weakness   OT Problem List: Decreased strength;Decreased activity tolerance;Impaired balance (sitting and/or standing);Decreased range of motion;Pain   OT Treatment/Interventions: Self-care/ADL training;DME and/or AE instruction;Patient/family education;Balance training;Therapeutic exercise    OT Goals(Current goals can be found in the care plan section) Acute Rehab OT Goals Patient Stated Goal: to return home OT Goal Formulation: With patient Time For Goal Achievement: 12/13/15 Potential to Achieve Goals: Good  OT Frequency: Min 2X/week   Barriers to D/C:            Co-evaluation              End of Session    Activity Tolerance: Patient tolerated treatment well Patient left: in chair;with call bell/phone within reach;with  nursing/sitter in room   Time: [id number] OT Time Calculation (min): 20 min Charges:  OT General Charges $OT Visit: 1 Procedure OT Evaluation $OT Eval Moderate Complexity: 1 Procedure G-Codes:    Osten Janek 2015/12/18, 8:28 AM  Lesle Chris, OTR/L [phone] December 18, 2015

## 2015-12-06 NOTE — Progress Notes (Signed)
[facility] PROGRESS NOTE  Michelle Buck [id number] DOB: 1936-04-26 DOA: 12/04/2015 PCP: [name], NP  Summary 12/05/15: I have seen and examined Michelle Buck at bedside and reviewed her chart. She was admitted earlier this morning by Dr Myna Hidalgo. Please refer to his comprehensive H&P for details. Michelle Buck is a pleasant 80 year old female who came in with cough and dyspnea and was found to have right lower lobe healthcare associated pneumonia. She is on broad-spectrum antibiotics. Reports feeling better. Will continue management per Dr Myna Hidalgo. 12/06/15: Blood cultures show no growth, MRSA pcr negative. Bun/cr better. Will narrow antibiotics to Levaquin. Patient requests hospital bed at home and this has been ordered. Plan HCAP (healthcare-associated pneumonia)  Follow Septic work up  Change antibiotics to Levaquin adjusted for crcl per pharmacy Hypokalemia/CKD (chronic kidney disease)/CHF (congestive heart failure) ([place])  Patient more on the dry side  Replenish potassium and gently hydrate. Essential hypertension, benign/Diabetes mellitus with diabetic nephropathy, with long-term current use of insulin (HCC)  No acute changes  Continue current management Code Status: Full Code Family Communication: No family at bedside  Disposition Plan: Home eventually.   Consultants:  None  Procedures:  None  Antibiotics:  Levaquin>  HPI/Subjective: Feels better. Has central chest discomfort.  Objective: Filed Vitals:   12/06/15 1448 12/06/15 2102  BP: 118/62 119/64  Pulse: 57 74  Temp: 98.3 F (36.8 C) 97.9 F (36.6 C)  Resp: 16 16    Intake/Output Summary (Last 24 hours) at 12/06/15 2143 Last data filed at 12/06/15 1245  Gross per 24 hour  Intake   1050 ml  Output      0 ml  Net   1050 ml   Filed Weights   12/05/15 1300 12/06/15 0511  Weight: 103.329 kg (227 lb 12.8 oz) 101.9 kg (224 lb 10.4 oz)    Exam:   General:  Comfortable at  rest.  Cardiovascular: S1-S2 normal. No murmurs. Pulse regular.  Respiratory: Good air entry bilaterally. No rhonchi or rales.  Abdomen: Soft and nontender. Normal bowel sounds. No organomegaly.  Musculoskeletal: No pedal edema   Neurological: Intact  Data Reviewed: Basic Metabolic Panel:  Recent Labs Lab 12/04/15 2222 12/05/15 0247 12/06/15 0445  NA 141  --  142  K 2.9*  --  4.1  CL 102  --  106  CO2 27  --  28  GLUCOSE 164*  --  171*  BUN 42*  --  38*  CREATININE 2.55*  --  2.05*  CALCIUM 9.3  --  9.0  MG  --  1.9  --    Liver Function Tests: No results for input(s): AST, ALT, ALKPHOS, BILITOT, PROT, ALBUMIN in the last 168 hours. No results for input(s): LIPASE, AMYLASE in the last 168 hours. No results for input(s): AMMONIA in the last 168 hours. CBC:  Recent Labs Lab 12/04/15 2222  WBC 10.5  HGB 11.8*  HCT 37.5  MCV 78.3  PLT 330   Cardiac Enzymes: No results for input(s): CKTOTAL, CKMB, CKMBINDEX, TROPONINI in the last 168 hours. BNP (last 3 results)  Recent Labs  09/10/15 1922 12/05/15 0124 12/05/15 0247  BNP 29.8 23.6 20.3    ProBNP (last 3 results) No results for input(s): PROBNP in the last 8760 hours.  CBG:  Recent Labs Lab 12/05/15 2035 12/06/15 0752 12/06/15 1154 12/06/15 1637 12/06/15 2100  GLUCAP 172* 123* 166* 176* 172*    Recent Results (from the past 240 hour(s))  Culture, blood (routine x 2) Call MD  if unable to obtain prior to antibiotics being given     Status: None (Preliminary result)   Collection Time: 12/05/15  2:50 AM  Result Value Ref Range Status   Specimen Description BLOOD RIGHT HAND  Final   Special Requests BOTTLES DRAWN AEROBIC AND ANAEROBIC 5CC  Final   Culture   Final    NO GROWTH 1 DAY Performed at [facility]    Report Status PENDING  Incomplete  Culture, blood (routine x 2) Call MD if unable to obtain prior to antibiotics being given     Status: None (Preliminary result)   Collection  Time: 12/05/15  2:51 AM  Result Value Ref Range Status   Specimen Description BLOOD LEFT ANTECUBITAL  Final   Special Requests BOTTLES DRAWN AEROBIC AND ANAEROBIC 10CC  Final   Culture   Final    NO GROWTH 1 DAY Performed at [facility]    Report Status PENDING  Incomplete  MRSA PCR Screening     Status: None   Collection Time: 12/06/15  6:48 AM  Result Value Ref Range Status   MRSA by PCR NEGATIVE NEGATIVE Final    Comment:        The GeneXpert MRSA Assay (FDA approved for NASAL specimens only), is one component of a comprehensive MRSA colonization surveillance program. It is not intended to diagnose MRSA infection nor to guide or monitor treatment for MRSA infections.      Studies: Dg Chest 2 View  12/04/2015  CLINICAL DATA:  Cough and congestion for 2 weeks. Shortness of breath. EXAM: CHEST  2 VIEW COMPARISON:  09/10/2015 FINDINGS: Chronic eventration along the anterior right hemidiaphragm. Mild enlargement of cardiac silhouette appears unchanged. Retrocardiac opacity is compatible with a hiatal hernia. There are linear densities in the right lower lung. No focal airspace disease in the left lung. Degenerative changes in the left shoulder joint. IMPRESSION: New densities in the right lower chest. Findings could represent focus of infection and/or atelectasis. Followup PA and lateral chest X-ray is recommended in 3-4 weeks following trial of antibiotic therapy to ensure resolution and exclude underlying malignancy. Electronically Signed   By: Markus Daft M.D.   On: 12/04/2015 22:11    Scheduled Meds: . aspirin EC  81 mg Oral Daily  . budesonide  0.25 mg Nebulization BID  . cilostazol  100 mg Oral Daily  . cloNIDine  0.1 mg Oral Daily  . cycloSPORINE  1 drop Both Eyes BID  . dextromethorphan-guaiFENesin  1 tablet Oral BID  . DULoxetine  30 mg Oral Daily  . furosemide  40 mg Oral BID  . gabapentin  200 mg Oral TID  . heparin  5,000 Units Subcutaneous 3 times per day  .  hydrALAZINE  50 mg Oral BID  . insulin aspart  0-20 Units Subcutaneous TID WC  . insulin glargine  20 Units Subcutaneous QHS  . ipratropium-albuterol  3 mL Nebulization TID  . latanoprost  1 drop Both Eyes QHS  . [START ON 12/08/2015] levofloxacin  750 mg Oral Q48H  . lisinopril  2.5 mg Oral Daily  . metoprolol succinate  25 mg Oral Daily  . pantoprazole  40 mg Oral Daily  . potassium chloride  40 mEq Oral BID  . simvastatin  20 mg Oral q1800  . sodium chloride  3 mL Intravenous Q12H  . sucralfate  1 g Oral BID  . timolol  1 drop Both Eyes BID   Continuous Infusions:    Time spent:  25 minutes    Saketh Daubert  [facility] Pager [phone]. If 7PM-7AM, please contact night-coverage at [email or web address], password [email or web address] 12/06/2015, 9:43 PM  LOS: 1 day

## 2015-12-06 NOTE — Progress Notes (Signed)
Pharmacy Antibiotic Follow-up Note  Michelle Buck is a 80 y.o. year-old female admitted on 12/04/2015.  The patient is currently on day 2 of Vancomycin & Aztreonam for PNA.  She was initially started on broad-spectrum antibiotics due to hospital admission in Oct 2016.   Assessment/Plan: After discussion with Dr Sanjuana Letters the current antibiotics will be narrowed to Levaquin  Levaquin dose will be adjusted to 750mg  IV q48h Continue to monitor renal function and cx data  Temp (24hrs), Avg:97.9 F (36.6 C), Min:97.8 F (36.6 C), Max:98 F (36.7 C)   Recent Labs Lab 12/04/15 2222  WBC 10.5    Recent Labs Lab 12/04/15 2222 12/06/15 0445  CREATININE 2.55* 2.05*   Estimated Creatinine Clearance: 25.7 mL/min (by C-G formula based on Cr of 2.05).    Allergies  Allergen Reactions  . Tramadol Other (See Comments)    Leg cramps   . Codeine Nausea And Vomiting    Patient states N/V with codeine  . Hydrocodone Nausea And Vomiting  . Oxycodone Other (See Comments)    Patient states she can tolerate oxycodone  . Tylenol [Acetaminophen] Hives  . Penicillins Rash    Patient states rash/itch with penicillin Has patient had a PCN reaction causing immediate rash, facial/tongue/throat swelling, SOB or lightheadedness with hypotension: Yes- broke me out and i was itching Has patient had a PCN reaction causing severe rash involving mucus membranes or skin necrosis: No Has patient had a PCN reaction that required hospitalization Yes- i was already in the hospital Has patient had a PCN reaction occurring within the last 10 years: No If all of the above answers are "NO    Antimicrobials this admission: 12/05/2015 >> vanc >>1/6 12/05/2015 >> aztreo>>1/6  Levels/dose changes this admission:   Microbiology results: 1/5 BCx: NGTD 1/6 MRSA PCR: negative  Thank you for allowing pharmacy to be a part of this patient's care.  Netta Cedars, PharmD, BCPS Pager: [phone] 12/06/2015 1:01 PM

## 2015-12-06 NOTE — Progress Notes (Signed)
Pt's brother Benedetto Coons [phone] can be called for delivery of Hospital Bed.

## 2015-12-07 LAB — CBC
HEMATOCRIT: 34 % — AB (ref 36.0–46.0)
HEMOGLOBIN: 10.6 g/dL — AB (ref 12.0–15.0)
MCH: 24.8 pg — AB (ref 26.0–34.0)
MCHC: 31.2 g/dL (ref 30.0–36.0)
MCV: 79.6 fL (ref 78.0–100.0)
Platelets: 281 10*3/uL (ref 150–400)
RBC: 4.27 MIL/uL (ref 3.87–5.11)
RDW: 17 % — ABNORMAL HIGH (ref 11.5–15.5)
WBC: 8.9 10*3/uL (ref 4.0–10.5)

## 2015-12-07 LAB — PROCALCITONIN: Procalcitonin: <0.1 ng/mL

## 2015-12-07 LAB — BASIC METABOLIC PANEL
Anion gap: 8 (ref 5–15)
BUN: 41 mg/dL — AB (ref 6–20)
CHLORIDE: 103 mmol/L (ref 101–111)
CO2: 32 mmol/L (ref 22–32)
Calcium: 9.1 mg/dL (ref 8.9–10.3)
Creatinine, Ser: 1.98 mg/dL — ABNORMAL HIGH (ref 0.44–1.00)
GFR calc Af Amer: 27 mL/min — ABNORMAL LOW (ref >60)
GFR calc non Af Amer: 23 mL/min — ABNORMAL LOW (ref >60)
GLUCOSE: 134 mg/dL — AB (ref 65–99)
POTASSIUM: 4.7 mmol/L (ref 3.5–5.1)
Sodium: 143 mmol/L (ref 135–145)

## 2015-12-07 LAB — GLUCOSE, CAPILLARY
GLUCOSE-CAPILLARY: 181 mg/dL — AB (ref 65–99)
Glucose-Capillary: 124 mg/dL — ABNORMAL HIGH (ref 65–99)
Glucose-Capillary: 100 mg/dL — ABNORMAL HIGH (ref 65–99)

## 2015-12-07 MED ORDER — IPRATROPIUM-ALBUTEROL 0.5-2.5 (3) MG/3ML IN SOLN
3.0000 mL | Freq: Three times a day (TID) | RESPIRATORY_TRACT | Status: DC
  Administered 2015-12-07 – 2015-12-08 (×2): 3 mL via RESPIRATORY_TRACT
  Filled 2015-12-07 (×3): qty 3

## 2015-12-07 NOTE — Progress Notes (Signed)
[facility] PROGRESS NOTE  Michelle Buck [id number] DOB: 09/07/1936 DOA: 12/04/2015 PCP: [name], NP  Summary 12/05/15: I have seen and examined Ms Michelle Buck at bedside and reviewed her chart. She was admitted earlier this morning by Dr Myna Hidalgo. Please refer to his comprehensive H&P for details. Michelle Buck is a pleasant 80 year old female who came in with cough and dyspnea and was found to have right lower lobe healthcare associated pneumonia. She is on broad-spectrum antibiotics. Reports feeling better. Will continue management per Dr Myna Hidalgo. 12/06/15: Blood cultures show no growth, MRSA pcr negative. Bun/cr better. Will narrow antibiotics to Levaquin. Patient requests hospital bed at home and this has been ordered. 12/07/15: Septic workup remains unremarkable. Feels better. Will plan discharge home tomorrow if she continues to do well. Plan HCAP (healthcare-associated pneumonia)  Follow Septic work up  Continue Levaquin adjusted for crcl per pharmacy Hypokalemia/CKD (chronic kidney disease)/CHF (congestive heart failure) ([place])  Stable. Essential hypertension, benign/Diabetes mellitus with diabetic nephropathy, with long-term current use of insulin (HCC)  No acute changes  Continue current management Code Status: Full Code Family Communication: No family at bedside  Disposition Plan: Home eventually.   Consultants:  None  Procedures:  None  Antibiotics:  Levaquin>  HPI/Subjective: No complaints.  Objective: Filed Vitals:   12/07/15 0800 12/07/15 1345  BP:  124/80  Pulse: 87 69  Temp:  97.9 F (36.6 C)  Resp: 18 16    Intake/Output Summary (Last 24 hours) at 12/07/15 1642 Last data filed at 12/07/15 1307  Gross per 24 hour  Intake    360 ml  Output      0 ml  Net    360 ml   Filed Weights   12/05/15 1300 12/06/15 0511 12/07/15 0512  Weight: 103.329 kg (227 lb 12.8 oz) 101.9 kg (224 lb 10.4 oz) 103.8 kg (228 lb 13.4 oz)    Exam:   General:   Comfortable at rest.  Cardiovascular: S1-S2 normal. No murmurs. Pulse regular.  Respiratory: Good air entry bilaterally. No rhonchi or rales.  Abdomen: Soft and nontender. Normal bowel sounds. No organomegaly.  Musculoskeletal: No pedal edema   Neurological: Intact  Data Reviewed: Basic Metabolic Panel:  Recent Labs Lab 12/04/15 2222 12/05/15 0247 12/06/15 0445 12/07/15 0437  NA 141  --  142 143  K 2.9*  --  4.1 4.7  CL 102  --  106 103  CO2 27  --  28 32  GLUCOSE 164*  --  171* 134*  BUN 42*  --  38* 41*  CREATININE 2.55*  --  2.05* 1.98*  CALCIUM 9.3  --  9.0 9.1  MG  --  1.9  --   --    Liver Function Tests: No results for input(s): AST, ALT, ALKPHOS, BILITOT, PROT, ALBUMIN in the last 168 hours. No results for input(s): LIPASE, AMYLASE in the last 168 hours. No results for input(s): AMMONIA in the last 168 hours. CBC:  Recent Labs Lab 12/04/15 2222 12/07/15 0437  WBC 10.5 8.9  HGB 11.8* 10.6*  HCT 37.5 34.0*  MCV 78.3 79.6  PLT 330 281   Cardiac Enzymes: No results for input(s): CKTOTAL, CKMB, CKMBINDEX, TROPONINI in the last 168 hours. BNP (last 3 results)  Recent Labs  09/10/15 1922 12/05/15 0124 12/05/15 0247  BNP 29.8 23.6 20.3    ProBNP (last 3 results) No results for input(s): PROBNP in the last 8760 hours.  CBG:  Recent Labs Lab 12/06/15 1154 12/06/15 1637 12/06/15 2100 12/07/15  [id number] 12/07/15 1152  GLUCAP 166* 176* 172* 124* 181*    Recent Results (from the past 240 hour(s))  Culture, blood (routine x 2) Call MD if unable to obtain prior to antibiotics being given     Status: None (Preliminary result)   Collection Time: 12/05/15  2:50 AM  Result Value Ref Range Status   Specimen Description BLOOD RIGHT HAND  Final   Special Requests BOTTLES DRAWN AEROBIC AND ANAEROBIC 5CC  Final   Culture   Final    NO GROWTH 2 DAYS Performed at [facility]    Report Status PENDING  Incomplete  Culture, blood (routine x 2) Call MD  if unable to obtain prior to antibiotics being given     Status: None (Preliminary result)   Collection Time: 12/05/15  2:51 AM  Result Value Ref Range Status   Specimen Description BLOOD LEFT ANTECUBITAL  Final   Special Requests BOTTLES DRAWN AEROBIC AND ANAEROBIC 10CC  Final   Culture   Final    NO GROWTH 2 DAYS Performed at [facility]    Report Status PENDING  Incomplete  MRSA PCR Screening     Status: None   Collection Time: 12/06/15  6:48 AM  Result Value Ref Range Status   MRSA by PCR NEGATIVE NEGATIVE Final    Comment:        The GeneXpert MRSA Assay (FDA approved for NASAL specimens only), is one component of a comprehensive MRSA colonization surveillance program. It is not intended to diagnose MRSA infection nor to guide or monitor treatment for MRSA infections.      Studies: No results found.  Scheduled Meds: . aspirin EC  81 mg Oral Daily  . budesonide  0.25 mg Nebulization BID  . cilostazol  100 mg Oral Daily  . cloNIDine  0.1 mg Oral Daily  . cycloSPORINE  1 drop Both Eyes BID  . dextromethorphan-guaiFENesin  1 tablet Oral BID  . DULoxetine  30 mg Oral Daily  . furosemide  40 mg Oral BID  . gabapentin  200 mg Oral TID  . heparin  5,000 Units Subcutaneous 3 times per day  . hydrALAZINE  50 mg Oral BID  . insulin aspart  0-20 Units Subcutaneous TID WC  . insulin glargine  20 Units Subcutaneous QHS  . ipratropium-albuterol  3 mL Nebulization TID  . latanoprost  1 drop Both Eyes QHS  . [START ON 12/08/2015] levofloxacin  750 mg Oral Q48H  . lisinopril  2.5 mg Oral Daily  . metoprolol succinate  25 mg Oral Daily  . pantoprazole  40 mg Oral Daily  . potassium chloride  40 mEq Oral BID  . simvastatin  20 mg Oral q1800  . sodium chloride  3 mL Intravenous Q12H  . sucralfate  1 g Oral BID  . timolol  1 drop Both Eyes BID   Continuous Infusions:    Time spent: 15 minutes    Victoire Deans  [facility] Pager [phone]. If 7PM-7AM,  please contact night-coverage at [email or web address], password [facility] 12/07/2015, 4:42 PM  LOS: 2 days

## 2015-12-08 LAB — GLUCOSE, CAPILLARY
Glucose-Capillary: 211 mg/dL — ABNORMAL HIGH (ref 65–99)
Glucose-Capillary: 153 mg/dL — ABNORMAL HIGH (ref 65–99)
Glucose-Capillary: 147 mg/dL — ABNORMAL HIGH (ref 65–99)

## 2015-12-08 LAB — BASIC METABOLIC PANEL
Anion gap: 10 (ref 5–15)
BUN: 42 mg/dL — AB (ref 6–20)
CHLORIDE: 101 mmol/L (ref 101–111)
CO2: 29 mmol/L (ref 22–32)
Calcium: 9.4 mg/dL (ref 8.9–10.3)
Creatinine, Ser: 1.98 mg/dL — ABNORMAL HIGH (ref 0.44–1.00)
GFR calc Af Amer: 27 mL/min — ABNORMAL LOW (ref >60)
GFR calc non Af Amer: 23 mL/min — ABNORMAL LOW (ref >60)
GLUCOSE: 174 mg/dL — AB (ref 65–99)
POTASSIUM: 4.3 mmol/L (ref 3.5–5.1)
Sodium: 140 mmol/L (ref 135–145)

## 2015-12-08 LAB — CBC
HEMATOCRIT: 36.3 % (ref 36.0–46.0)
Hemoglobin: 11.3 g/dL — ABNORMAL LOW (ref 12.0–15.0)
MCH: 24.7 pg — AB (ref 26.0–34.0)
MCHC: 31.1 g/dL (ref 30.0–36.0)
MCV: 79.3 fL (ref 78.0–100.0)
Platelets: 310 10*3/uL (ref 150–400)
RBC: 4.58 MIL/uL (ref 3.87–5.11)
RDW: 17.1 % — AB (ref 11.5–15.5)
WBC: 9.3 10*3/uL (ref 4.0–10.5)

## 2015-12-08 LAB — TSH: TSH: 2.584 u[IU]/mL (ref 0.350–4.500)

## 2015-12-08 MED ORDER — LEVOFLOXACIN 750 MG PO TABS: 750.0000 mg | ORAL_TABLET | ORAL | Status: DC

## 2015-12-08 NOTE — Discharge Summary (Signed)
Michelle Buck, is a 80 y.o. female  DOB 12-Dec-1935  MRN [id number].  Admission date:  12/04/2015  Admitting Physician  Vianne Bulls, MD  Discharge Date:  12/08/2015   Primary MD  [name], NP  Recommendations for primary care physician for things to follow:  Monitor renal function and reassess risks/benefits of resuming ACE inhibitor.   Admission Diagnosis   Healthcare-associated pneumonia [J18.9] HCAP (healthcare-associated pneumonia) [J18.9]   Discharge Diagnosis  Healthcare-associated pneumonia [J18.9] HCAP (healthcare-associated pneumonia) [J18.9]   Active Problems:   Essential hypertension, benign   Diabetes mellitus with diabetic nephropathy, with long-term current use of insulin ([place])   CKD (chronic kidney disease)   CHF (congestive heart failure) ([place])     Summary Michelle Buck is a pleasant 80 year old female who came in with cough and dyspnea and was found to have right lower lobe healthcare associated pneumonia. She responded to broad-spectrum antibiotics and will DC home to complete course of Levaquin. Septic workup was unremarkable. She feels close to her baseline and will discharge today to follow with her PCP in the next 1-2 weeks. Please refer to the daily progress notes below for details of patient's hospital stay; Hospital Course  12/05/15: I have seen and examined Michelle Buck at bedside and reviewed her chart. She was admitted earlier this morning by Dr Myna Hidalgo. Please refer to his comprehensive H&P for details. Michelle Buck is a pleasant 80 year old female who came in with cough and dyspnea and was found to have right lower lobe healthcare associated pneumonia. She is on broad-spectrum antibiotics. Reports feeling better. Will continue management per Dr Myna Hidalgo. 12/06/15: Blood cultures show no  growth, MRSA pcr negative. Bun/cr better. Will narrow antibiotics to Levaquin. Patient requests hospital bed at home and this has been ordered. 12/07/15: Septic workup remains unremarkable. Feels better. Will plan discharge home tomorrow if she continues to do well. 12/08/15: Feels at her baseline and is easier to go home. No complaints.   Discharge Condition Stable.  Consults obtained  None  Follow UP     Discharge Instructions  and  Discharge Medications  Discharge Instructions    Diet - low sodium heart healthy    Complete by:  As directed      Diet Carb Modified    Complete by:  As directed      Discharge instructions    Complete by:  As directed   Please Follow up with your regular doctor in 1 week     Increase activity slowly    Complete by:  As directed             Medication List    STOP taking these medications        azithromycin 250 MG tablet  Commonly known as:  ZITHROMAX Z-PAK      TAKE these medications        albuterol 108 (90 Base) MCG/ACT inhaler  Commonly known as:  PROVENTIL HFA;VENTOLIN HFA  Inhale 1 puff into the lungs every 6 (six) hours as  needed for wheezing or shortness of breath.     AMITIZA 24 MCG capsule  Generic drug:  lubiprostone  TAKE 1 CAPSULE BY MOUTH 2 TIMES A DAY WITH MEALS     aspirin EC 81 MG tablet  Take 81 mg by mouth daily.     budesonide 0.25 MG/2ML nebulizer solution  Commonly known as:  PULMICORT  Take 2 mLs (0.25 mg total) by nebulization 2 (two) times daily.     cilostazol 100 MG tablet  Commonly known as:  PLETAL  TAKE 1 TABLET BY MOUTH EVERY DAY     cloNIDine 0.1 MG tablet  Commonly known as:  CATAPRES  TAKE 1 TABLET BY MOUTH EVERY DAY     cycloSPORINE 0.05 % ophthalmic emulsion  Commonly known as:  RESTASIS  Place 1 drop into both eyes 2 (two) times daily. One drop once a day bilaterally for dry eyes     DULoxetine 30 MG capsule  Commonly known as:  CYMBALTA  TAKE 1 CAPSULE BY MOUTH EVERY DAY TO HELP  ANXIETY AND PAINS     EASY TOUCH INSULIN SYRINGE 31G X 5/16" 0.5 ML Misc  Generic drug:  Insulin Syringe-Needle U-100  USE UP TO 2 TIMES A DAY AS DIRECTED     furosemide 40 MG tablet  Commonly known as:  LASIX  Take 1 tablet (40 mg total) by mouth 2 (two) times daily.     gabapentin 100 MG capsule  Commonly known as:  NEURONTIN  TAKE 2 CAPSULES BY MOUTH THREE TIMES A DAY     HUMULIN 70/30 (70-30) 100 UNIT/ML injection  Generic drug:  insulin NPH-regular Human  Inject 40 units subcutaneously with breakfast and 20 units with dinner to control blood sugar.     hydrALAZINE 50 MG tablet  Commonly known as:  APRESOLINE  TAKE 1 TABLET BY MOUTH TWICE DAILY     HYDROcodone-acetaminophen 10-325 MG tablet  Commonly known as:  NORCO  Take 1 tablet by mouth every 6 (six) hours as needed for moderate pain.     ipratropium-albuterol 0.5-2.5 (3) MG/3ML Soln  Commonly known as:  DUONEB  Take 3 mLs by nebulization every 4 (four) hours as needed.     levofloxacin 750 MG tablet  Commonly known as:  LEVAQUIN  Take 1 tablet (750 mg total) by mouth every other day.     magnesium oxide 400 MG tablet  Commonly known as:  MAG-OX  Take 1 tablet (400 mg total) by mouth 2 (two) times daily.     metoprolol succinate 25 MG 24 hr tablet  Commonly known as:  TOPROL-XL  TAKE 1 TABLET BY MOUTH EVERY DAY WITH OR IMMEDIATELY FOLLOWING A MEAL FOR BLOOD PRESSURE     NEXIUM 40 MG capsule  Generic drug:  esomeprazole  TAKE 1 CAPSULE BY MOUTH ONCE DAILY     potassium chloride SA 20 MEQ tablet  Commonly known as:  K-DUR,KLOR-CON  TAKE 1 TABLET BY MOUTH EVERY DAY     predniSONE 20 MG tablet  Commonly known as:  DELTASONE  Take 2 tablets (40 mg total) by mouth daily with breakfast.     simvastatin 20 MG tablet  Commonly known as:  ZOCOR  TAKE 1 TABLET BY MOUTH EVERY DAY     sucralfate 1 g tablet  Commonly known as:  CARAFATE  TAKE 1 TABLET BY MOUTH TWICE DAILY ON EMPTY STOMACH     timolol 0.5 %  ophthalmic solution  Commonly known as:  TIMOPTIC  Place  1 drop into both eyes 2 (two) times daily. One drop in bilaterally twice a day as needed     Travoprost (BAK Free) 0.004 % Soln ophthalmic solution  Commonly known as:  TRAVATAN  Place 1 drop into both eyes at bedtime. One drop bilaterally twice a day     VOLTAREN 1 % Gel  Generic drug:  diclofenac sodium  APPLY 4 GRAM TOPICALLY TWICE DAILY        Diet and Activity recommendation: See Discharge Instructions above  Major procedures and Radiology Reports - PLEASE review detailed and final reports for all details, in brief -    Dg Chest 2 View  12/04/2015  CLINICAL DATA:  Cough and congestion for 2 weeks. Shortness of breath. EXAM: CHEST  2 VIEW COMPARISON:  09/10/2015 FINDINGS: Chronic eventration along the anterior right hemidiaphragm. Mild enlargement of cardiac silhouette appears unchanged. Retrocardiac opacity is compatible with a hiatal hernia. There are linear densities in the right lower lung. No focal airspace disease in the left lung. Degenerative changes in the left shoulder joint. IMPRESSION: New densities in the right lower chest. Findings could represent focus of infection and/or atelectasis. Followup PA and lateral chest X-ray is recommended in 3-4 weeks following trial of antibiotic therapy to ensure resolution and exclude underlying malignancy. Electronically Signed   By: Markus Daft M.D.   On: 12/04/2015 22:11    Micro Results   Recent Results (from the past 240 hour(s))  Culture, blood (routine x 2) Call MD if unable to obtain prior to antibiotics being given     Status: None (Preliminary result)   Collection Time: 12/05/15  2:50 AM  Result Value Ref Range Status   Specimen Description BLOOD RIGHT HAND  Final   Special Requests BOTTLES DRAWN AEROBIC AND ANAEROBIC 5CC  Final   Culture   Final    NO GROWTH 3 DAYS Performed at [facility]    Report Status PENDING  Incomplete  Culture, blood (routine x 2)  Call MD if unable to obtain prior to antibiotics being given     Status: None (Preliminary result)   Collection Time: 12/05/15  2:51 AM  Result Value Ref Range Status   Specimen Description BLOOD LEFT ANTECUBITAL  Final   Special Requests BOTTLES DRAWN AEROBIC AND ANAEROBIC 10CC  Final   Culture   Final    NO GROWTH 3 DAYS Performed at [facility]    Report Status PENDING  Incomplete  MRSA PCR Screening     Status: None   Collection Time: 12/06/15  6:48 AM  Result Value Ref Range Status   MRSA by PCR NEGATIVE NEGATIVE Final    Comment:        The GeneXpert MRSA Assay (FDA approved for NASAL specimens only), is one component of a comprehensive MRSA colonization surveillance program. It is not intended to diagnose MRSA infection nor to guide or monitor treatment for MRSA infections.        Today   Subjective:   Michelle Buck today has no headache,no chest abdominal pain,no new weakness tingling or numbness, feels much better wants to go home today.   Objective:   Blood pressure 146/80, pulse 66, temperature 98.1 F (36.7 C), temperature source Oral, resp. rate 18, height 5\' 2"  (1.575 m), weight 101.47 kg (223 lb 11.2 oz), SpO2 93 %.   Intake/Output Summary (Last 24 hours) at 12/08/15 1203 Last data filed at 12/08/15 0900  Gross per 24 hour  Intake   1200  ml  Output      0 ml  Net   1200 ml    Exam Awake Alert, Oriented x 3, No new F.N deficits, Normal affect [place].AT,PERRAL Supple Neck,No JVD, No cervical lymphadenopathy appriciated.  Symmetrical Chest wall movement, Good air movement bilaterally, CTAB RRR,No Gallops,Rubs or new Murmurs, No Parasternal Heave +ve B.Sounds, Abd Soft, Non tender, No organomegaly appriciated, No rebound -guarding or rigidity. No Cyanosis, Clubbing or edema, No new Rash or bruise  Data Review   CBC w Diff: Lab Results  Component Value Date   WBC 9.3 12/08/2015   WBC 16.2* 08/20/2015   HGB 11.3* 12/08/2015   HCT 36.3  12/08/2015   HCT 36.6 08/20/2015   PLT 310 12/08/2015   PLT 321 08/20/2015   LYMPHOPCT 11* 08/12/2015   MONOPCT 6 08/12/2015   EOSPCT 1 08/12/2015   BASOPCT 0 08/12/2015    CMP: Lab Results  Component Value Date   NA 140 12/08/2015   NA 140 10/15/2015   K 4.3 12/08/2015   CL 101 12/08/2015   CO2 29 12/08/2015   BUN 42* 12/08/2015   BUN 27 10/15/2015   CREATININE 1.98* 12/08/2015   PROT 7.1 10/15/2015   PROT 6.7 09/11/2015   ALBUMIN 4.1 10/15/2015   ALBUMIN 3.0* 09/11/2015   BILITOT 0.2 10/15/2015   BILITOT 0.3 09/11/2015   ALKPHOS 104 10/15/2015   AST 15 10/15/2015   ALT 10 10/15/2015  .   Total Time in preparing paper work, data evaluation and todays exam - 25 minutes  Lyza Houseworth M.D on 12/08/2015 at 12:03 PM  [facility] Office  [phone]

## 2015-12-08 NOTE — Progress Notes (Signed)
Patient  is  A&O x4 and ambulatory. Discharge instruction reviewed, questions, concerns were denied.

## 2015-12-10 ENCOUNTER — Encounter: Payer: Self-pay | Admitting: Nurse Practitioner

## 2015-12-10 ENCOUNTER — Ambulatory Visit (INDEPENDENT_AMBULATORY_CARE_PROVIDER_SITE_OTHER): Payer: Medicare Other | Admitting: Nurse Practitioner

## 2015-12-10 VITALS — BP 124/68 | HR 74 | Temp 98.0°F | Ht 62.0 in | Wt 227.2 lb

## 2015-12-10 DIAGNOSIS — E1121 Type 2 diabetes mellitus with diabetic nephropathy: Secondary | ICD-10-CM | POA: Diagnosis not present

## 2015-12-10 DIAGNOSIS — J189 Pneumonia, unspecified organism: Secondary | ICD-10-CM

## 2015-12-10 DIAGNOSIS — J449 Chronic obstructive pulmonary disease, unspecified: Secondary | ICD-10-CM | POA: Diagnosis not present

## 2015-12-10 DIAGNOSIS — I10 Essential (primary) hypertension: Secondary | ICD-10-CM | POA: Diagnosis not present

## 2015-12-10 DIAGNOSIS — Z794 Long term (current) use of insulin: Secondary | ICD-10-CM | POA: Diagnosis not present

## 2015-12-10 DIAGNOSIS — I5032 Chronic diastolic (congestive) heart failure: Secondary | ICD-10-CM

## 2015-12-10 DIAGNOSIS — M545 Low back pain, unspecified: Secondary | ICD-10-CM

## 2015-12-10 DIAGNOSIS — G8929 Other chronic pain: Secondary | ICD-10-CM

## 2015-12-10 DIAGNOSIS — N182 Chronic kidney disease, stage 2 (mild): Secondary | ICD-10-CM

## 2015-12-10 LAB — CULTURE, BLOOD (ROUTINE X 2)
CULTURE: NO GROWTH
Culture: NO GROWTH

## 2015-12-10 MED ORDER — HYDROCODONE-ACETAMINOPHEN 10-325 MG PO TABS: 1.0000 | ORAL_TABLET | Freq: Four times a day (QID) | ORAL | Status: DC | PRN

## 2015-12-10 NOTE — Progress Notes (Signed)
Patient ID: Michelle Buck, female   DOB: [birth year], 80 y.o.   MRN: [id number]    PCP: [name], NP   Allergies  Allergen Reactions  . Tramadol Other (See Comments)    Leg cramps   . Codeine Nausea And Vomiting    Patient states N/V with codeine  . Hydrocodone Nausea And Vomiting  . Oxycodone Other (See Comments)    Patient states she can tolerate oxycodone  . Tylenol [Acetaminophen] Hives  . Penicillins Rash    Patient states rash/itch with penicillin Has patient had a PCN reaction causing immediate rash, facial/tongue/throat swelling, SOB or lightheadedness with hypotension: Yes- broke me out and i was itching Has patient had a PCN reaction causing severe rash involving mucus membranes or skin necrosis: No Has patient had a PCN reaction that required hospitalization Yes- i was already in the hospital Has patient had a PCN reaction occurring within the last 10 years: No If all of the above answers are "NO    Chief Complaint  Patient presents with  . Hospitalization Follow-up  . Medical Management of Chronic Issues     HPI: Patient is a 80 y.o. female seen in the office today for hospital follow up. Pt was admitted to the hospital due to right lower lobe healthcare associated pneumonia. Pt was hospitalized from 1/4-12/08/15. Pt came to hospital due to shortness of breath and cough.  She responded to broad-spectrum antibiotics and was discharged home to complete course of Levaquin. She did not pick up Levaquin yet, plans to get it today.  Reports feeling better, still has productive cough No shortness of breath. Still using re Pt with fluid restriction, staying to that.  Sometimes needing duonebs at night.  Needs pain medication refilled. Takes 2-3 pills a day.  Takes all medication prescribed, no sided effects  Pain all over, mostly legs and feet and hip.   Review of Systems:  Review of Systems  Constitutional: Negative for fever, chills, diaphoresis, appetite change  and unexpected weight change.  HENT: Negative for congestion, ear discharge and ear pain.   Eyes: Negative for visual disturbance.  Respiratory: Positive for cough and shortness of breath (baseline, worse with exertion). Negative for wheezing.   Cardiovascular: Negative for chest pain and palpitations.  Gastrointestinal: Negative for constipation.  Genitourinary: Negative for dysuria.  Musculoskeletal: Positive for back pain, arthralgias and neck pain.  Skin: Negative for color change, rash and wound.  Neurological: Positive for numbness (neuropathy to hands and legs). Negative for dizziness, facial asymmetry, light-headedness and headaches.  Hematological: Negative for adenopathy.  Psychiatric/Behavioral: Negative for behavioral problems, dysphoric mood and agitation.    Past Medical History  Diagnosis Date  . Diabetes mellitus   . Hypertension   . Obese   . Hiatal hernia   . Scoliosis   . Arthritis   . Acute bronchitis   . COPD (chronic obstructive pulmonary disease) ([place])   . Unspecified hereditary and idiopathic peripheral neuropathy   . Secondary diabetes mellitus with renal manifestations, not stated as uncontrolled, or unspecified   . Chronic kidney disease, stage II (mild)   . Chronic kidney disease, stage II (mild)   . Chronic kidney disease, unspecified ([place])   . Disorder of bone and cartilage, unspecified   . Complications affecting other specified body systems, hypertension   . Hypopotassemia   . Anxiety   . Congestive heart failure, unspecified   . GERD (gastroesophageal reflux disease)   . Lumbago   . Insomnia, unspecified   .  Hyperlipidemia   . Unspecified glaucoma   . Diaphragmatic hernia without mention of obstruction or gangrene   . Osteoarthrosis, unspecified whether generalized or localized, unspecified site   . Degenerative arthritis of knee, bilateral    Past Surgical History  Procedure Laterality Date  . Abdominal hysterectomy    . Shoulder surgery     . Total hip arthroplasty      bilateral  . Knee arthroscopy      bilateral  . Colonoscopy  08/06/2011  . Foot surgery     Social History:   reports that she quit smoking about 18 years ago. Her smoking use included Cigarettes. She has never used smokeless tobacco. She reports that she does not drink alcohol or use illicit drugs.  Family History  Problem Relation Age of Onset  . Diabetes Mother   . Heart disease Mother   . Heart disease Brother   . Diabetes Brother   . Kidney disease Father   . Scoliosis Sister   . Diabetes Brother   . Diabetes Brother   . Heart disease Brother   . Scoliosis Brother   . Stroke Brother   . Heart attack Brother     Medications: Patient's Medications  New Prescriptions   No medications on file  Previous Medications   ALBUTEROL (PROVENTIL HFA;VENTOLIN HFA) 108 (90 BASE) MCG/ACT INHALER    Inhale 1 puff into the lungs every 6 (six) hours as needed for wheezing or shortness of breath.   AMITIZA 24 MCG CAPSULE    TAKE 1 CAPSULE BY MOUTH 2 TIMES A DAY WITH MEALS   ASPIRIN EC 81 MG TABLET    Take 81 mg by mouth daily.   BUDESONIDE (PULMICORT) 0.25 MG/2ML NEBULIZER SOLUTION    Take 2 mLs (0.25 mg total) by nebulization 2 (two) times daily.   CILOSTAZOL (PLETAL) 100 MG TABLET    TAKE 1 TABLET BY MOUTH EVERY DAY   CLONIDINE (CATAPRES) 0.1 MG TABLET    TAKE 1 TABLET BY MOUTH EVERY DAY   CYCLOSPORINE (RESTASIS) 0.05 % OPHTHALMIC EMULSION    Place 1 drop into both eyes 2 (two) times daily. One drop once a day bilaterally for dry eyes   DULOXETINE (CYMBALTA) 30 MG CAPSULE    TAKE 1 CAPSULE BY MOUTH EVERY DAY TO HELP ANXIETY AND PAINS   EASY TOUCH INSULIN SYRINGE 31G X 5/16" 0.5 ML MISC    USE UP TO 2 TIMES A DAY AS DIRECTED   FUROSEMIDE (LASIX) 40 MG TABLET    Take 1 tablet (40 mg total) by mouth 2 (two) times daily.   GABAPENTIN (NEURONTIN) 100 MG CAPSULE    TAKE 2 CAPSULES BY MOUTH THREE TIMES A DAY   HYDRALAZINE (APRESOLINE) 50 MG TABLET    TAKE 1  TABLET BY MOUTH TWICE DAILY   HYDROCODONE-ACETAMINOPHEN (NORCO) 10-325 MG TABLET    Take 1 tablet by mouth every 6 (six) hours as needed for moderate pain.   INSULIN NPH-REGULAR HUMAN (HUMULIN 70/30) (70-30) 100 UNIT/ML INJECTION    Inject 40 units subcutaneously with breakfast and 20 units with dinner to control blood sugar.   IPRATROPIUM-ALBUTEROL (DUONEB) 0.5-2.5 (3) MG/3ML SOLN    Take 3 mLs by nebulization every 4 (four) hours as needed.   LEVOFLOXACIN (LEVAQUIN) 750 MG TABLET    Take 1 tablet (750 mg total) by mouth every other day.   MAGNESIUM OXIDE (MAG-OX) 400 MG TABLET    Take 1 tablet (400 mg total) by mouth 2 (two) times daily.  METOPROLOL SUCCINATE (TOPROL-XL) 25 MG 24 HR TABLET    TAKE 1 TABLET BY MOUTH EVERY DAY WITH OR IMMEDIATELY FOLLOWING A MEAL FOR BLOOD PRESSURE   NEXIUM 40 MG CAPSULE    TAKE 1 CAPSULE BY MOUTH ONCE DAILY   POTASSIUM CHLORIDE SA (K-DUR,KLOR-CON) 20 MEQ TABLET    TAKE 1 TABLET BY MOUTH EVERY DAY   PREDNISONE (DELTASONE) 20 MG TABLET    Take 2 tablets (40 mg total) by mouth daily with breakfast.   SIMVASTATIN (ZOCOR) 20 MG TABLET    TAKE 1 TABLET BY MOUTH EVERY DAY   SUCRALFATE (CARAFATE) 1 G TABLET    TAKE 1 TABLET BY MOUTH TWICE DAILY ON EMPTY STOMACH   TIMOLOL (TIMOPTIC) 0.5 % OPHTHALMIC SOLUTION    Place 1 drop into both eyes 2 (two) times daily. One drop in bilaterally twice a day as needed   TRAVOPROST, BAK FREE, (TRAVATAN) 0.004 % SOLN OPHTHALMIC SOLUTION    Place 1 drop into both eyes at bedtime. One drop bilaterally twice a day   VOLTAREN 1 % GEL    APPLY 4 GRAM TOPICALLY TWICE DAILY  Modified Medications   No medications on file  Discontinued Medications   No medications on file     Physical Exam:  Filed Vitals:   12/10/15 1305  BP: 124/68  Pulse: 74  Temp: 98 F (36.7 C)  TempSrc: Oral  Height: 5\' 2"  (1.575 m)  Weight: 227 lb 3.2 oz (103.057 kg)   Body mass index is 41.54 kg/(m^2).  Physical Exam  Constitutional: She is oriented to  person, place, and time. She appears well-developed and well-nourished. No distress.  Obese AA female, in NAD  HENT:  Head: Normocephalic and atraumatic.  Neck: Normal range of motion. Neck supple.  Cardiovascular: Normal rate, regular rhythm and normal heart sounds.   Pulmonary/Chest: Effort normal and breath sounds normal. No respiratory distress. She has no wheezes.  Abdominal: Soft. Bowel sounds are normal.  Musculoskeletal: She exhibits no edema.  Uses rolling walker   Lymphadenopathy:    She has no cervical adenopathy.  Neurological: She is alert and oriented to person, place, and time.  Skin: Skin is warm and dry. She is not diaphoretic.  Psychiatric: She has a normal mood and affect.    Labs reviewed: Basic Metabolic Panel:  Recent Labs  09/13/15 0511  10/29/15 1612  12/05/15 0247 12/06/15 0445 12/07/15 0437 12/08/15 0345 12/08/15 1433  NA 138  < >  --   < >  --  142 143 140  --   K 3.3*  < >  --   < >  --  4.1 4.7 4.3  --   CL 100*  < >  --   < >  --  106 103 101  --   CO2 30  < >  --   < >  --  28 32 29  --   GLUCOSE 124*  < >  --   < >  --  171* 134* 174*  --   BUN 24*  < >  --   < >  --  38* 41* 42*  --   CREATININE 2.03*  < >  --   < >  --  2.05* 1.98* 1.98*  --   CALCIUM 8.8*  < >  --   < >  --  9.0 9.1 9.4  --   MG 1.6*  --  1.5*  --  1.9  --   --   --   --  TSH  --   --   --   --   --   --   --   --  2.584  < > = values in this interval not displayed. Liver Function Tests:  Recent Labs  09/10/15 1922 09/11/15 0400 10/15/15 0807  AST 19 14* 15  ALT 10* 8* 10  ALKPHOS 96 84 104  BILITOT 0.6 0.3 0.2  PROT 7.6 6.7 7.1  ALBUMIN 3.5 3.0* 4.1   No results for input(s): LIPASE, AMYLASE in the last 8760 hours. No results for input(s): AMMONIA in the last 8760 hours. CBC:  Recent Labs  07/02/15 1026  08/12/15 0454  08/20/15 1141  12/04/15 2222 12/07/15 0437 12/08/15 0345  WBC 6.9  < > 9.8  < > 16.2*  < > 10.5 8.9 9.3  NEUTROABS 4.4  --  8.0*   --  12.3*  --   --   --   --   HGB  --   < > 10.2*  < >  --   < > 11.8* 10.6* 11.3*  HCT 33.3*  < > 32.6*  < > 36.6  < > 37.5 34.0* 36.3  MCV 83  < > 85.6  < > 85  < > 78.3 79.6 79.3  PLT 235  < > PLATELET CLUMPS NOTED ON SMEAR, UNABLE TO ESTIMATE  < > 321  < > 330 281 310  < > = values in this interval not displayed. Lipid Panel:  Recent Labs  02/19/15 1057 03/14/15 0806  CHOL 174 147  HDL 44 41  LDLCALC 75 62  TRIG 275* 219*  CHOLHDL 4.0 3.6   TSH:  Recent Labs  12/08/15 1433  TSH 2.584   A1C: Lab Results  Component Value Date   HGBA1C 7.6* 12/05/2015     Assessment/Plan 1. Chronic midline low back pain without sciatica -stable, refill provided for hydrocodone/acetaminophen.  - HYDROcodone-acetaminophen (NORCO) 10-325 MG tablet; Take 1 tablet by mouth every 6 (six) hours as needed for moderate pain.  Dispense: 90 tablet; Refill: 0  2. Healthcare-associated pneumonia -improving, to complete treatment of Levaquin -may use mucinex DM BID PRN -cont neb treatments as needed   3. Chronic obstructive pulmonary disease, unspecified COPD type ([place]) With recent pneumonia COPD stable at this time, no worsening shortness of breath, conts with cough but remains stable. To cont current regimen  4. Chronic diastolic congestive heart failure (HCC) -euvolemic, conts fluid restriction, following with cardiology and current medication regimen   5. Essential hypertension, benign -blood pressure stable, cont current medications  6. CKD (chronic kidney disease), stage 2 (mild) Renal function stable from hospitalization, avoid NSAIDs and dehydration.   7. Type 2 diabetes mellitus with diabetic nephropathy, with long-term current use of insulin (HCC) A1c slightly elevated from previous, will cont current regimen and monitor at this time. To bring blood sugar readings to follow up appt  Follow up in 6 weeks.   Carlos American. Harle Battiest  [facility] [phone] 8 am - 5 pm) [phone] (after hours)

## 2015-12-10 NOTE — Patient Instructions (Addendum)
Make sure to complete course of Levaquin May use mucinex DM twice daily as needed for cough and congestion

## 2015-12-13 ENCOUNTER — Telehealth: Payer: Self-pay | Admitting: *Deleted

## 2015-12-13 NOTE — Telephone Encounter (Signed)
Patient called and wanted to clarify how she takes her Lasix because her legs have been swelling. Medication list instructs patient to take Lasix twice daily. Informed patient and she agreed to take twice daily and if swelling didn't go down will call the office.

## 2015-12-16 DIAGNOSIS — H401131 Primary open-angle glaucoma, bilateral, mild stage: Secondary | ICD-10-CM | POA: Diagnosis not present

## 2015-12-17 ENCOUNTER — Telehealth (HOSPITAL_COMMUNITY): Payer: Self-pay

## 2015-12-17 NOTE — Telephone Encounter (Signed)
Encounter complete. 

## 2015-12-18 ENCOUNTER — Other Ambulatory Visit: Payer: Self-pay | Admitting: Nurse Practitioner

## 2015-12-19 ENCOUNTER — Ambulatory Visit (HOSPITAL_COMMUNITY)
Admission: RE | Admit: 2015-12-19 | Discharge: 2015-12-19 | Disposition: A | Discharge status: Home / Self Care | Payer: Medicare Other | Source: Ambulatory Visit | Attending: Cardiovascular Disease | Admitting: Cardiovascular Disease

## 2015-12-19 DIAGNOSIS — R5383 Other fatigue: Secondary | ICD-10-CM | POA: Insufficient documentation

## 2015-12-19 DIAGNOSIS — I10 Essential (primary) hypertension: Secondary | ICD-10-CM | POA: Insufficient documentation

## 2015-12-19 DIAGNOSIS — Z8249 Family history of ischemic heart disease and other diseases of the circulatory system: Secondary | ICD-10-CM | POA: Insufficient documentation

## 2015-12-19 DIAGNOSIS — R0609 Other forms of dyspnea: Secondary | ICD-10-CM | POA: Diagnosis not present

## 2015-12-19 DIAGNOSIS — Z6841 Body Mass Index (BMI) 40.0 and over, adult: Secondary | ICD-10-CM | POA: Diagnosis not present

## 2015-12-19 DIAGNOSIS — R079 Chest pain, unspecified: Secondary | ICD-10-CM | POA: Diagnosis not present

## 2015-12-19 DIAGNOSIS — Z87891 Personal history of nicotine dependence: Secondary | ICD-10-CM | POA: Diagnosis not present

## 2015-12-19 DIAGNOSIS — E119 Type 2 diabetes mellitus without complications: Secondary | ICD-10-CM | POA: Diagnosis not present

## 2015-12-19 DIAGNOSIS — E669 Obesity, unspecified: Secondary | ICD-10-CM | POA: Insufficient documentation

## 2015-12-19 MED ORDER — REGADENOSON 0.4 MG/5ML IV SOLN
0.4000 mg | Freq: Once | INTRAVENOUS | Status: AC
  Administered 2015-12-19: 0.4 mg via INTRAVENOUS

## 2015-12-19 MED ORDER — TECHNETIUM TC 99M SESTAMIBI GENERIC - CARDIOLITE
32.0000 | Freq: Once | INTRAVENOUS | Status: AC | PRN
  Administered 2015-12-19: 32 via INTRAVENOUS

## 2015-12-20 ENCOUNTER — Other Ambulatory Visit: Payer: Self-pay

## 2015-12-20 ENCOUNTER — Other Ambulatory Visit: Payer: Self-pay | Admitting: *Deleted

## 2015-12-20 ENCOUNTER — Ambulatory Visit (HOSPITAL_COMMUNITY)
Admission: RE | Admit: 2015-12-20 | Discharge: 2015-12-20 | Disposition: A | Discharge status: Home / Self Care | Payer: Medicare Other | Source: Ambulatory Visit | Attending: Cardiovascular Disease | Admitting: Cardiovascular Disease

## 2015-12-20 LAB — MYOCARDIAL PERFUSION IMAGING
LV dias vol: 85 mL
LV sys vol: 27 mL
Peak HR: 78 {beats}/min
Rest HR: 55 {beats}/min
SDS: 4
SRS: 0
SSS: 4
TID: 1.08

## 2015-12-20 MED ORDER — TECHNETIUM TC 99M SESTAMIBI GENERIC - CARDIOLITE
31.6000 | Freq: Once | INTRAVENOUS | Status: AC | PRN
  Administered 2015-12-20: 32 via INTRAVENOUS

## 2015-12-20 NOTE — Patient Outreach (Addendum)
[facility]) Care Management  12/20/2015  Michelle Buck 1936/11/03 [id number]  SUBJECTIVE:  Telephone call to patient regarding EMMI pneumonia RED referral. HIPAA compliant voice message left with call back phone number. Discussed EMMI stroke program with patient. Patient verbally agreed to receive follow up calls with [facility].   Patient states she is doing ok.  Patient states she has some shortness of breath but it is not as bad as it was.  Patient states she can tell its improving. Patient reports she took her last antibiotic pill on Saturday, 12/14/15.   Patient states she used her inhaler this morning. Patient reports she uses her inhaler approximately 2 times per day. Patient states she still has some wheezing. Patient states she continues to use her nebulizer every 6 hours.  Patient reports she saw her cardiologist on yesterday and today. States xray's were done and she received a shot. Patient unsure what shot was for. Patient states she saw her primary provider on 12/10/15 and has a follow up in February 2017. Patient states she does not have her appointment scheduled with the neurologist. Patient states her sister will make the appointment for her.  Patient states she is able to obtain all of her medications. Patient states her sister fixes her pill box every week. Patient states she has good support from her sister and brother. Patient states her brother lives with her. Patient states she has transportation to her doctor appointments.  Patient reports she uses a walker for ambulation.  RNCM advised patient to take her medication as prescribed by her doctor. RNCM advised patient to schedule her follow up appointment with neurologist and keep all other scheduled appointments with her doctors.  RNCM discussed with patient signs of pneumonia and advised patient to contact doctor for fever, shortness of breath, wheezing, cough. Patient verbalized understanding. Patient verbally agreed to  next telephone outreach with [facility].  ASSESSMENT:  EMMI pneumonia  program.   Admitted on 12/04/15 through 12/08/15.  Per EPIC patient was admitted to the hospital with cough and dyspnea and was found to have right lower lobe healthcare associated pneumonia.    PLAN: RNCM will follow up with patient within 1 week.   Quinn Plowman RN,BSN,CCM [facility] Telephonic  [phone]

## 2015-12-21 ENCOUNTER — Other Ambulatory Visit: Payer: Self-pay | Admitting: Internal Medicine

## 2015-12-23 ENCOUNTER — Telehealth: Payer: Self-pay | Admitting: *Deleted

## 2015-12-23 NOTE — Telephone Encounter (Signed)
-----   Message from Skeet Latch, MD sent at 12/22/2015  4:06 PM EST ----- Normal stress test.

## 2015-12-23 NOTE — Telephone Encounter (Signed)
Spoke to patient. Result given . Verbalized understanding  

## 2015-12-24 ENCOUNTER — Other Ambulatory Visit: Payer: Self-pay | Admitting: Nurse Practitioner

## 2015-12-24 ENCOUNTER — Other Ambulatory Visit: Payer: Self-pay

## 2015-12-24 VITALS — Ht 62.0 in | Wt 230.0 lb

## 2015-12-24 DIAGNOSIS — I509 Heart failure, unspecified: Secondary | ICD-10-CM

## 2015-12-24 DIAGNOSIS — J441 Chronic obstructive pulmonary disease with (acute) exacerbation: Secondary | ICD-10-CM

## 2015-12-24 DIAGNOSIS — Z79899 Other long term (current) drug therapy: Secondary | ICD-10-CM

## 2015-12-24 NOTE — Patient Outreach (Signed)
[facility]) Care Management  12/24/2015  Michelle Buck 1936/06/16 [id number]   Telephone call to patient regarding EMMI pneumonia referral follow up. HIPAA verified with patient. Discussed and offered [facility] care management services to patient.  Patient verbally agreed to have [facility] care management services.  Patient states she has support from her sister, Stoney Bang and brother Ambrose Mantle.  Patient states her sister [name] sets up her medications for her every Sunday for the week.  Patient states her brother Vonna Drafts lives with her. Patient states last follow up with her primary MD was 12/10/15.  States she has a follow scheduled with her primary MD in February 2017. Patient states she has not made an appointment with her neurologist as of yet. States her sister will have to do that.  Patient states she used her nebulizer this am. Patient states she had her throat was horse this morning and she had phlegm in there throat so she used her nebulizer.  Patient states she has some shortness of breath but not as bad.  Patient states she is on a lot of medications and she doesn't know what all of the medications are for.  Patient unwilling to answer any more questions by phone with this RNCM. Patient agrees to have [facility] nurse follow up with her at home.   ASSESSMENT:  EMMI pneumonia referral.  Patient  Hospitalized from 12/04/15 to 12/08/15 for healthcare associated pneumonia. Per EPIC patient has COPD and congestive heart failure. Patient is on fluid restriction Patient previous admission per EPIC 09/10/15 through 09/13/15 for Acute respiratory failure with hypoxia  Patient not wanting to answer anymore questions by telephone with RNCM.    PLAN: RNCM will refer patient to community case manager and pharmacy.   Quinn Plowman RN,BSN,CCM [facility] Telephonic  [phone]

## 2015-12-26 ENCOUNTER — Telehealth: Payer: Self-pay

## 2015-12-26 NOTE — Telephone Encounter (Signed)
Patient brought in a letter from [employer] stating that prior authorization was required for Nexium 40 mg. Prior authorization was initiated by calling [id number]. Patient ID# [id number].  Approved until 11-29-16. Approval letter will be faxed to office this afternoon.

## 2015-12-28 ENCOUNTER — Encounter (HOSPITAL_COMMUNITY): Payer: Self-pay | Admitting: *Deleted

## 2015-12-28 ENCOUNTER — Emergency Department (HOSPITAL_COMMUNITY): Payer: Medicare Other

## 2015-12-28 ENCOUNTER — Emergency Department (HOSPITAL_COMMUNITY)
Admission: EM | Admit: 2015-12-28 | Discharge: 2015-12-28 | Disposition: A | Discharge status: Home / Self Care | Payer: Medicare Other | Attending: Emergency Medicine | Admitting: Emergency Medicine

## 2015-12-28 DIAGNOSIS — G4700 Insomnia, unspecified: Secondary | ICD-10-CM | POA: Diagnosis not present

## 2015-12-28 DIAGNOSIS — M419 Scoliosis, unspecified: Secondary | ICD-10-CM | POA: Diagnosis not present

## 2015-12-28 DIAGNOSIS — I509 Heart failure, unspecified: Secondary | ICD-10-CM | POA: Insufficient documentation

## 2015-12-28 DIAGNOSIS — Z794 Long term (current) use of insulin: Secondary | ICD-10-CM | POA: Insufficient documentation

## 2015-12-28 DIAGNOSIS — E669 Obesity, unspecified: Secondary | ICD-10-CM | POA: Diagnosis not present

## 2015-12-28 DIAGNOSIS — E785 Hyperlipidemia, unspecified: Secondary | ICD-10-CM | POA: Diagnosis not present

## 2015-12-28 DIAGNOSIS — Z87891 Personal history of nicotine dependence: Secondary | ICD-10-CM | POA: Diagnosis not present

## 2015-12-28 DIAGNOSIS — R600 Localized edema: Secondary | ICD-10-CM | POA: Diagnosis not present

## 2015-12-28 DIAGNOSIS — Z880 Allergy status to penicillin: Secondary | ICD-10-CM | POA: Diagnosis not present

## 2015-12-28 DIAGNOSIS — Z79899 Other long term (current) drug therapy: Secondary | ICD-10-CM | POA: Insufficient documentation

## 2015-12-28 DIAGNOSIS — Z8719 Personal history of other diseases of the digestive system: Secondary | ICD-10-CM | POA: Diagnosis not present

## 2015-12-28 DIAGNOSIS — I129 Hypertensive chronic kidney disease with stage 1 through stage 4 chronic kidney disease, or unspecified chronic kidney disease: Secondary | ICD-10-CM | POA: Diagnosis not present

## 2015-12-28 DIAGNOSIS — Z7982 Long term (current) use of aspirin: Secondary | ICD-10-CM | POA: Insufficient documentation

## 2015-12-28 DIAGNOSIS — F419 Anxiety disorder, unspecified: Secondary | ICD-10-CM | POA: Diagnosis not present

## 2015-12-28 DIAGNOSIS — N182 Chronic kidney disease, stage 2 (mild): Secondary | ICD-10-CM | POA: Insufficient documentation

## 2015-12-28 DIAGNOSIS — J449 Chronic obstructive pulmonary disease, unspecified: Secondary | ICD-10-CM | POA: Diagnosis not present

## 2015-12-28 DIAGNOSIS — M1990 Unspecified osteoarthritis, unspecified site: Secondary | ICD-10-CM | POA: Diagnosis not present

## 2015-12-28 DIAGNOSIS — E119 Type 2 diabetes mellitus without complications: Secondary | ICD-10-CM | POA: Diagnosis not present

## 2015-12-28 DIAGNOSIS — R2243 Localized swelling, mass and lump, lower limb, bilateral: Secondary | ICD-10-CM | POA: Diagnosis not present

## 2015-12-28 DIAGNOSIS — E876 Hypokalemia: Secondary | ICD-10-CM | POA: Diagnosis not present

## 2015-12-28 DIAGNOSIS — R0602 Shortness of breath: Secondary | ICD-10-CM | POA: Diagnosis not present

## 2015-12-28 DIAGNOSIS — H409 Unspecified glaucoma: Secondary | ICD-10-CM | POA: Insufficient documentation

## 2015-12-28 LAB — COMPREHENSIVE METABOLIC PANEL
ALK PHOS: 105 U/L (ref 38–126)
ALT: 9 U/L — ABNORMAL LOW (ref 14–54)
ANION GAP: 13 (ref 5–15)
AST: 17 U/L (ref 15–41)
Albumin: 3.6 g/dL (ref 3.5–5.0)
BILIRUBIN TOTAL: 0.2 mg/dL — AB (ref 0.3–1.2)
BUN: 35 mg/dL — ABNORMAL HIGH (ref 6–20)
CALCIUM: 9.8 mg/dL (ref 8.9–10.3)
CO2: 28 mmol/L (ref 22–32)
Chloride: 103 mmol/L (ref 101–111)
Creatinine, Ser: 2.03 mg/dL — ABNORMAL HIGH (ref 0.44–1.00)
GFR calc non Af Amer: 22 mL/min — ABNORMAL LOW (ref >60)
GFR, EST AFRICAN AMERICAN: 26 mL/min — AB (ref >60)
Glucose, Bld: 99 mg/dL (ref 65–99)
Potassium: 4 mmol/L (ref 3.5–5.1)
Sodium: 144 mmol/L (ref 135–145)
TOTAL PROTEIN: 7.4 g/dL (ref 6.5–8.1)

## 2015-12-28 LAB — CBC WITH DIFFERENTIAL/PLATELET
BASOS ABS: 0 10*3/uL (ref 0.0–0.1)
BASOS PCT: 0 %
Eosinophils Absolute: 0.5 10*3/uL (ref 0.0–0.7)
Eosinophils Relative: 6 %
HEMATOCRIT: 33.6 % — AB (ref 36.0–46.0)
HEMOGLOBIN: 10.6 g/dL — AB (ref 12.0–15.0)
LYMPHS PCT: 27 %
Lymphs Abs: 2 10*3/uL (ref 0.7–4.0)
MCH: 24.7 pg — ABNORMAL LOW (ref 26.0–34.0)
MCHC: 31.5 g/dL (ref 30.0–36.0)
MCV: 78.1 fL (ref 78.0–100.0)
Monocytes Absolute: 0.5 10*3/uL (ref 0.1–1.0)
Monocytes Relative: 7 %
NEUTROS ABS: 4.5 10*3/uL (ref 1.7–7.7)
NEUTROS PCT: 60 %
Platelets: 268 10*3/uL (ref 150–400)
RBC: 4.3 MIL/uL (ref 3.87–5.11)
RDW: 16.9 % — AB (ref 11.5–15.5)
WBC: 7.6 10*3/uL (ref 4.0–10.5)

## 2015-12-28 LAB — URINALYSIS, ROUTINE W REFLEX MICROSCOPIC
Bilirubin Urine: NEGATIVE
Glucose, UA: NEGATIVE mg/dL
KETONES UR: NEGATIVE mg/dL
LEUKOCYTES UA: NEGATIVE
NITRITE: NEGATIVE
PH: 5 (ref 5.0–8.0)
Protein, ur: NEGATIVE mg/dL
SPECIFIC GRAVITY, URINE: 1.007 (ref 1.005–1.030)

## 2015-12-28 LAB — URINE MICROSCOPIC-ADD ON

## 2015-12-28 LAB — TROPONIN I: Troponin I: <0.03 ng/mL (ref <0.031)

## 2015-12-28 LAB — BRAIN NATRIURETIC PEPTIDE: B NATRIURETIC PEPTIDE 5: 25.5 pg/mL (ref 0.0–100.0)

## 2015-12-28 MED ORDER — FUROSEMIDE 10 MG/ML IJ SOLN
60.0000 mg | Freq: Once | INTRAMUSCULAR | Status: AC
  Administered 2015-12-28: 60 mg via INTRAVENOUS
  Filled 2015-12-28: qty 8

## 2015-12-28 NOTE — ED Notes (Signed)
Pt reports bilateral LE swelling onset Saturday.

## 2015-12-28 NOTE — ED Notes (Signed)
RN starting IV and will draw labs

## 2015-12-28 NOTE — ED Notes (Signed)
Pt reports BLE swelling with mild pain x 2 days.  Recently had a stress test done at [place].  Denies any hx of DVT.

## 2015-12-28 NOTE — ED Notes (Signed)
Rees at bedside and verbalizes to pt may take (1) hydrocodone-acetaminophen 10-325 tablet home medication; both Ralene Bathe and this staff member visualize pt taking medication at present time.

## 2015-12-28 NOTE — ED Notes (Signed)
Rees at bedside. 

## 2015-12-28 NOTE — ED Notes (Signed)
Unsuccessful attempt to draw blood.  Will notify Main Lab.

## 2015-12-28 NOTE — ED Notes (Addendum)
Right forearm 22 g removed; therapy completed.

## 2015-12-28 NOTE — ED Notes (Signed)
Attempt to start IV/blood draw twice. Staff overhead via radio made aware of IV/blood draw if available. IV consult placed.

## 2015-12-28 NOTE — ED Provider Notes (Signed)
CSN: [id number]     Arrival date & time 12/28/15  1148 History   First MD Initiated Contact with Patient 12/28/15 1222     Chief Complaint  Patient presents with  . Leg Swelling    The history is provided by the patient. No language interpreter was used.   Michelle Buck is a 80 y.o. female who presents to the Emergency Department complaining of leg swelling.  She reports one week of bilateral lower extremity swelling and pain and three days of cough productive of yellow sputum.  She has associated SOB.  She had indigestion yesterday, resolved after tums. No fevers, abdominal pain, diarrhea, dysuria, no change in urinary output.  Sxs are moderate, constant, worsening.  She had an outpatient stress test a week ago. She has a hx/o COPD and chronic kidney disease, takes lasix twice daily.     Past Medical History  Diagnosis Date  . Diabetes mellitus   . Hypertension   . Obese   . Hiatal hernia   . Scoliosis   . Arthritis   . Acute bronchitis   . COPD (chronic obstructive pulmonary disease) ([place])   . Unspecified hereditary and idiopathic peripheral neuropathy   . Secondary diabetes mellitus with renal manifestations, not stated as uncontrolled, or unspecified   . Chronic kidney disease, stage II (mild)   . Chronic kidney disease, stage II (mild)   . Chronic kidney disease, unspecified ([place])   . Disorder of bone and cartilage, unspecified   . Complications affecting other specified body systems, hypertension   . Hypopotassemia   . Anxiety   . Congestive heart failure, unspecified   . GERD (gastroesophageal reflux disease)   . Lumbago   . Insomnia, unspecified   . Hyperlipidemia   . Unspecified glaucoma   . Diaphragmatic hernia without mention of obstruction or gangrene   . Osteoarthrosis, unspecified whether generalized or localized, unspecified site   . Degenerative arthritis of knee, bilateral    Past Surgical History  Procedure Laterality Date  . Abdominal hysterectomy    .  Shoulder surgery    . Total hip arthroplasty      bilateral  . Knee arthroscopy      bilateral  . Colonoscopy  08/06/2011  . Foot surgery     Family History  Problem Relation Age of Onset  . Diabetes Mother   . Heart disease Mother   . Heart disease Brother   . Diabetes Brother   . Kidney disease Father   . Scoliosis Sister   . Diabetes Brother   . Diabetes Brother   . Heart disease Brother   . Scoliosis Brother   . Stroke Brother   . Heart attack Brother    Social History  Substance Use Topics  . Smoking status: Former Smoker    Types: Cigarettes    Quit date: 02/27/1997  . Smokeless tobacco: Never Used  . Alcohol Use: No   OB History    No data available     Review of Systems  All other systems reviewed and are negative.     Allergies  Tramadol; Codeine; Hydrocodone; Oxycodone; Tylenol; and Penicillins  Home Medications   Prior to Admission medications   Medication Sig Start Date End Date Taking? Authorizing Provider  AMITIZA 24 MCG capsule TAKE 1 CAPSULE BY MOUTH 2 TIMES A DAY WITH MEALS Patient taking differently: TAKE 24 MCG BY MOUTH 2 TIMES A DAY WITH MEALS 10/02/15  Yes [name], NP  aspirin EC 81 MG  tablet Take 81 mg by mouth daily.   Yes Historical Provider, MD  budesonide (PULMICORT) 0.25 MG/2ML nebulizer solution Take 2 mLs (0.25 mg total) by nebulization 2 (two) times daily. 08/20/15  Yes [name], NP  cilostazol (PLETAL) 100 MG tablet TAKE 1 TABLET BY MOUTH EVERY DAY Patient taking differently: TAKE 100 MG BY MOUTH EVERY DAY 10/30/15  Yes [name], NP  cloNIDine (CATAPRES) 0.1 MG tablet TAKE 1 TABLET BY MOUTH EVERY DAY Patient taking differently: TAKE 0.1 MG BY MOUTH EVERY DAY 02/07/15  Yes Mahima Pandey, MD  cycloSPORINE (RESTASIS) 0.05 % ophthalmic emulsion Place 1 drop into both eyes 2 (two) times daily. One drop once a day bilaterally for dry eyes   Yes Historical Provider, MD  DULoxetine (CYMBALTA) 30 MG capsule TAKE 1  CAPSULE BY MOUTH EVERY DAY TO HELP ANXIETY AND PAINS Patient taking differently: TAKE 30 MG BY MOUTH EVERY DAY TO HELP ANXIETY AND PAINS 10/30/15  Yes [name], NP  EASY TOUCH INSULIN SYRINGE 31G X 5/16" 0.5 ML MISC USE UP TO 2 TIMES A DAY AS DIRECTED 11/28/15  Yes [name], NP  furosemide (LASIX) 40 MG tablet Take 1 tablet (40 mg total) by mouth 2 (two) times daily. 10/29/15  Yes [name], NP  gabapentin (NEURONTIN) 100 MG capsule TAKE 2 CAPSULES BY MOUTH THREE TIMES A DAY Patient taking differently: TAKE 200 MG BY MOUTH THREE TIMES A DAY 10/02/15  Yes [name], NP  guaifenesin (ROBITUSSIN) 100 MG/5ML syrup Take 200 mg by mouth 3 (three) times daily as needed for cough.   Yes Historical Provider, MD  hydrALAZINE (APRESOLINE) 50 MG tablet TAKE 1 TABLET BY MOUTH TWICE DAILY Patient taking differently: TAKE 50 MG BY MOUTH TWICE DAILY 09/02/15  Yes [name], NP  HYDROcodone-acetaminophen (NORCO) 10-325 MG tablet Take 1 tablet by mouth every 6 (six) hours as needed for moderate pain. 12/10/15  Yes [name], NP  insulin NPH-regular Human (HUMULIN 70/30) (70-30) 100 UNIT/ML injection Inject 40 units subcutaneously with breakfast and 20 units with dinner to control blood sugar. 10/29/15  Yes [name], NP  ipratropium-albuterol (DUONEB) 0.5-2.5 (3) MG/3ML SOLN Take 3 mLs by nebulization every 4 (four) hours as needed. Patient taking differently: Take 3 mLs by nebulization every 4 (four) hours as needed (SOB, wheezing).  08/21/15  Yes Benjamin Cartner, PA-C  magnesium oxide (MAG-OX) 400 MG tablet Take 1 tablet (400 mg total) by mouth 2 (two) times daily. 11/06/15  Yes [name], NP  metoprolol succinate (TOPROL-XL) 25 MG 24 hr tablet TAKE 1 TABLET BY MOUTH EVERY DAY WITH OR IMMEDIATELY FOLLOWING A MEAL FOR BLOOD PRESSURE 12/25/15  Yes [name], NP  NEXIUM 40 MG capsule TAKE 1 CAPSULE BY MOUTH ONCE DAILY Patient taking differently: TAKE  40 MG BY MOUTH ONCE DAILY 10/30/15  Yes [name], NP  potassium chloride SA (K-DUR,KLOR-CON) 20 MEQ tablet TAKE 1 TABLET BY MOUTH EVERY DAY Patient taking differently: TAKE 20 MEQ BY MOUTH EVERY DAY 09/02/15  Yes [name], NP  sertraline (ZOLOFT) 50 MG tablet TAKE 1 TABLET BY MOUTH EVERY DAY 12/25/15  Yes [name], NP  simvastatin (ZOCOR) 20 MG tablet TAKE 1 TABLET BY MOUTH EVERY DAY 12/25/15  Yes [name], NP  sucralfate (CARAFATE) 1 g tablet TAKE 1 TABLET BY MOUTH TWICE DAILY ON EMPTY STOMACH Patient taking differently: TAKE 1 G BY MOUTH TWICE DAILY ON EMPTY  STOMACH 11/28/15  Yes [name], NP  timolol (TIMOPTIC) 0.5 % ophthalmic solution Place 1 drop into both eyes 2 (two) times daily.    Yes Historical Provider, MD  Travoprost, BAK Free, (TRAVATAN) 0.004 % SOLN ophthalmic solution Place 1 drop into both eyes at bedtime. One drop bilaterally twice a day   Yes Historical Provider, MD  VENTOLIN HFA 108 (90 Base) MCG/ACT inhaler INHALE 1 PUFF 4 TIMES A DAY FOR 5 DAYS, THEN USE EVERY 6 HOURS AS NEEDED Patient taking differently: INHALE 1 PUFF 4 TIMES A DAY FOR 5 DAYS, THEN USE EVERY 6 HOURS AS NEEDED for shortness of breath/wheezing 12/23/15  Yes [name], NP  VOLTAREN 1 % GEL APPLY 4 GRAM TOPICALLY TWICE DAILY Patient taking differently: APPLY 4 GRAM TOPICALLY TWICE DAILY as needed for pain 10/02/15  Yes [name], NP  levofloxacin (LEVAQUIN) 750 MG tablet Take 1 tablet (750 mg total) by mouth every other day. Patient not taking: Reported on 12/20/2015 12/08/15   Simbiso Ranga, MD   BP 126/44 mmHg  Pulse 60  Temp(Src) 97.4 F (36.3 C) (Oral)  Resp 16  SpO2 94% Physical Exam  Constitutional: She is oriented to person, place, and time. She appears well-developed and well-nourished.  HENT:  Head: Normocephalic and atraumatic.  Cardiovascular: Normal rate and regular rhythm.   No murmur heard. Pulmonary/Chest: Effort normal. No respiratory  distress.  Decreased air movement in right lung base  Abdominal: Soft. There is no tenderness. There is no rebound and no guarding.  Musculoskeletal: She exhibits no tenderness.  2-3+ pitting edema in BLE, chronic venous stasis changes to anterior RLE (pt states ongoing for about a year).  2+ DP pulses in BLE.  Neurological: She is alert and oriented to person, place, and time.  Skin: Skin is warm and dry.  Psychiatric: She has a normal mood and affect. Her behavior is normal.  Nursing note and vitals reviewed.   ED Course  Procedures (including critical care time)  Prior admission and outpatient stress test reviewed in EPIC.   Labs Review Labs Reviewed  COMPREHENSIVE METABOLIC PANEL - Abnormal; Notable for the following:    BUN 35 (*)    Creatinine, Ser 2.03 (*)    ALT 9 (*)    Total Bilirubin 0.2 (*)    GFR calc non Af Amer 22 (*)    GFR calc Af Amer 26 (*)    All other components within normal limits  CBC WITH DIFFERENTIAL/PLATELET - Abnormal; Notable for the following:    Hemoglobin 10.6 (*)    HCT 33.6 (*)    MCH 24.7 (*)    RDW 16.9 (*)    All other components within normal limits  URINALYSIS, ROUTINE W REFLEX MICROSCOPIC (NOT AT [facility]) - Abnormal; Notable for the following:    Hgb urine dipstick TRACE (*)    All other components within normal limits  URINE MICROSCOPIC-ADD ON - Abnormal; Notable for the following:    Squamous Epithelial / LPF 0-5 (*)    Bacteria, UA RARE (*)    All other components within normal limits  TROPONIN I  BRAIN NATRIURETIC PEPTIDE    Imaging Review Dg Chest 2 View  12/28/2015  CLINICAL DATA:  Shortness of breath for 1 week. Lower extremity swelling. Acute on chronic diastolic congestive heart failure. Acute respiratory failure with hypoxia. EXAM: CHEST  2 VIEW COMPARISON:  12/04/2015 FINDINGS: Mild to moderate cardiomegaly remains stable. Probable hiatal hernia again noted. There has been  interval resolution of opacity in the right  perihilar region since previous study, consistent with resolving atelectasis or infiltrate. No new areas of pulmonary opacity are seen. No pneumothorax or pleural effusion visualized. Thoracic spine degenerative changes again noted. IMPRESSION: Resolution of right perihilar atelectasis or infiltrate since previous study. No acute findings. Stable cardiomegaly and probable hiatal hernia. Electronically Signed   By: Earle Gell M.D.   On: 12/28/2015 14:46   I have personally reviewed and evaluated these images and lab results as part of my medical decision-making.   EKG Interpretation   Date/Time:  Saturday December 28 2015 12:56:56 EST Ventricular Rate:  59 PR Interval:  139 QRS Duration: 83 QT Interval:  402 QTC Calculation: 398 R Axis:   67 Text Interpretation:  Sinus rhythm Confirmed by Hazle Coca [id number]) on  12/28/2015 1:25:02 PM      MDM   Final diagnoses:  Bilateral edema of lower extremity    Pt here for evaluation of BLE edema for the last week, she does not weigh herself daily - unsure of weight change.  She also has a cough for three days, no hemoptysis.  Pt without respiratory distress on exam, no wheezes.  CXR without evidence of pna or pulmonary edema.  BMP with stable renal insuffiency compared to priors.  BNP wnl, recent outpatient stress test without any acute abnormalities.  Current clinical picture is not c/w COPD exacerbation, pna, CHF, PE, ARF.  Treating with lasix IV with continued lasix at home, compression stockings, PCP follow up, low sodium diet, close return precautions.      Quintella Reichert, MD 12/28/15 [phone]

## 2015-12-28 NOTE — Discharge Instructions (Signed)

## 2015-12-31 ENCOUNTER — Other Ambulatory Visit: Payer: Self-pay | Admitting: *Deleted

## 2016-01-01 ENCOUNTER — Ambulatory Visit (HOSPITAL_COMMUNITY)
Admit: 2016-01-01 | Discharge: 2016-01-01 | Disposition: A | Discharge status: Home / Self Care | Payer: Medicare Other | Attending: Internal Medicine | Admitting: Internal Medicine

## 2016-01-01 ENCOUNTER — Inpatient Hospital Stay (HOSPITAL_COMMUNITY): Payer: Medicare Other

## 2016-01-01 ENCOUNTER — Encounter (HOSPITAL_COMMUNITY): Payer: Self-pay

## 2016-01-01 ENCOUNTER — Emergency Department (HOSPITAL_COMMUNITY): Payer: Medicare Other

## 2016-01-01 ENCOUNTER — Inpatient Hospital Stay (HOSPITAL_COMMUNITY)
Admission: EM | Admit: 2016-01-01 | Discharge: 2016-01-03 | DRG: 291 | Disposition: A | Discharge status: Home Health | Payer: Medicare Other | Attending: Internal Medicine | Admitting: Internal Medicine

## 2016-01-01 DIAGNOSIS — R600 Localized edema: Secondary | ICD-10-CM | POA: Diagnosis present

## 2016-01-01 DIAGNOSIS — E876 Hypokalemia: Secondary | ICD-10-CM | POA: Diagnosis present

## 2016-01-01 DIAGNOSIS — E1122 Type 2 diabetes mellitus with diabetic chronic kidney disease: Secondary | ICD-10-CM | POA: Diagnosis present

## 2016-01-01 DIAGNOSIS — I129 Hypertensive chronic kidney disease with stage 1 through stage 4 chronic kidney disease, or unspecified chronic kidney disease: Secondary | ICD-10-CM | POA: Diagnosis present

## 2016-01-01 DIAGNOSIS — R0602 Shortness of breath: Secondary | ICD-10-CM | POA: Diagnosis not present

## 2016-01-01 DIAGNOSIS — R05 Cough: Secondary | ICD-10-CM | POA: Diagnosis present

## 2016-01-01 DIAGNOSIS — Z6841 Body Mass Index (BMI) 40.0 and over, adult: Secondary | ICD-10-CM | POA: Diagnosis not present

## 2016-01-01 DIAGNOSIS — I5033 Acute on chronic diastolic (congestive) heart failure: Secondary | ICD-10-CM | POA: Diagnosis not present

## 2016-01-01 DIAGNOSIS — Z5181 Encounter for therapeutic drug level monitoring: Secondary | ICD-10-CM

## 2016-01-01 DIAGNOSIS — Z888 Allergy status to other drugs, medicaments and biological substances status: Secondary | ICD-10-CM | POA: Diagnosis not present

## 2016-01-01 DIAGNOSIS — E1140 Type 2 diabetes mellitus with diabetic neuropathy, unspecified: Secondary | ICD-10-CM | POA: Diagnosis present

## 2016-01-01 DIAGNOSIS — E669 Obesity, unspecified: Secondary | ICD-10-CM | POA: Diagnosis present

## 2016-01-01 DIAGNOSIS — Z794 Long term (current) use of insulin: Secondary | ICD-10-CM

## 2016-01-01 DIAGNOSIS — N184 Chronic kidney disease, stage 4 (severe): Secondary | ICD-10-CM | POA: Diagnosis not present

## 2016-01-01 DIAGNOSIS — I10 Essential (primary) hypertension: Secondary | ICD-10-CM | POA: Diagnosis present

## 2016-01-01 DIAGNOSIS — J449 Chronic obstructive pulmonary disease, unspecified: Secondary | ICD-10-CM | POA: Diagnosis present

## 2016-01-01 DIAGNOSIS — M7989 Other specified soft tissue disorders: Secondary | ICD-10-CM

## 2016-01-01 DIAGNOSIS — I509 Heart failure, unspecified: Secondary | ICD-10-CM

## 2016-01-01 DIAGNOSIS — R079 Chest pain, unspecified: Secondary | ICD-10-CM | POA: Diagnosis not present

## 2016-01-01 DIAGNOSIS — G609 Hereditary and idiopathic neuropathy, unspecified: Secondary | ICD-10-CM | POA: Diagnosis present

## 2016-01-01 DIAGNOSIS — Z885 Allergy status to narcotic agent status: Secondary | ICD-10-CM | POA: Diagnosis not present

## 2016-01-01 DIAGNOSIS — Z7982 Long term (current) use of aspirin: Secondary | ICD-10-CM | POA: Diagnosis not present

## 2016-01-01 DIAGNOSIS — M419 Scoliosis, unspecified: Secondary | ICD-10-CM | POA: Diagnosis present

## 2016-01-01 DIAGNOSIS — Z66 Do not resuscitate: Secondary | ICD-10-CM | POA: Diagnosis present

## 2016-01-01 DIAGNOSIS — I2510 Atherosclerotic heart disease of native coronary artery without angina pectoris: Secondary | ICD-10-CM | POA: Diagnosis present

## 2016-01-01 DIAGNOSIS — J410 Simple chronic bronchitis: Secondary | ICD-10-CM

## 2016-01-01 DIAGNOSIS — Z880 Allergy status to penicillin: Secondary | ICD-10-CM | POA: Diagnosis not present

## 2016-01-01 DIAGNOSIS — E785 Hyperlipidemia, unspecified: Secondary | ICD-10-CM | POA: Diagnosis present

## 2016-01-01 DIAGNOSIS — K219 Gastro-esophageal reflux disease without esophagitis: Secondary | ICD-10-CM | POA: Diagnosis present

## 2016-01-01 DIAGNOSIS — I130 Hypertensive heart and chronic kidney disease with heart failure and stage 1 through stage 4 chronic kidney disease, or unspecified chronic kidney disease: Principal | ICD-10-CM | POA: Diagnosis present

## 2016-01-01 DIAGNOSIS — I5031 Acute diastolic (congestive) heart failure: Secondary | ICD-10-CM | POA: Diagnosis not present

## 2016-01-01 DIAGNOSIS — E1169 Type 2 diabetes mellitus with other specified complication: Secondary | ICD-10-CM

## 2016-01-01 HISTORY — DX: Other specified postprocedural states: Z98.890

## 2016-01-01 HISTORY — DX: Other specified postprocedural states: R11.2

## 2016-01-01 LAB — MAGNESIUM: Magnesium: 2.3 mg/dL (ref 1.7–2.4)

## 2016-01-01 LAB — COMPREHENSIVE METABOLIC PANEL
ALBUMIN: 3.7 g/dL (ref 3.5–5.0)
ALT: 10 U/L — AB (ref 14–54)
AST: 15 U/L (ref 15–41)
Alkaline Phosphatase: 110 U/L (ref 38–126)
Anion gap: 12 (ref 5–15)
BUN: 25 mg/dL — AB (ref 6–20)
CHLORIDE: 104 mmol/L (ref 101–111)
CO2: 24 mmol/L (ref 22–32)
CREATININE: 1.85 mg/dL — AB (ref 0.44–1.00)
Calcium: 9.5 mg/dL (ref 8.9–10.3)
GFR calc Af Amer: 29 mL/min — ABNORMAL LOW (ref >60)
GFR, EST NON AFRICAN AMERICAN: 25 mL/min — AB (ref >60)
GLUCOSE: 126 mg/dL — AB (ref 65–99)
POTASSIUM: 3.7 mmol/L (ref 3.5–5.1)
Sodium: 140 mmol/L (ref 135–145)
Total Bilirubin: 0.3 mg/dL (ref 0.3–1.2)
Total Protein: 7.3 g/dL (ref 6.5–8.1)

## 2016-01-01 LAB — CBC WITH DIFFERENTIAL/PLATELET
BASOS PCT: 0 %
Basophils Absolute: 0 10*3/uL (ref 0.0–0.1)
EOS PCT: 6 %
Eosinophils Absolute: 0.5 10*3/uL (ref 0.0–0.7)
HEMATOCRIT: 34.1 % — AB (ref 36.0–46.0)
Hemoglobin: 10.7 g/dL — ABNORMAL LOW (ref 12.0–15.0)
LYMPHS PCT: 30 %
Lymphs Abs: 2.4 10*3/uL (ref 0.7–4.0)
MCH: 24.7 pg — ABNORMAL LOW (ref 26.0–34.0)
MCHC: 31.4 g/dL (ref 30.0–36.0)
MCV: 78.6 fL (ref 78.0–100.0)
Monocytes Absolute: 0.7 10*3/uL (ref 0.1–1.0)
Monocytes Relative: 9 %
NEUTROS ABS: 4.4 10*3/uL (ref 1.7–7.7)
Neutrophils Relative %: 55 %
PLATELETS: 285 10*3/uL (ref 150–400)
RBC: 4.34 MIL/uL (ref 3.87–5.11)
RDW: 17.2 % — ABNORMAL HIGH (ref 11.5–15.5)
WBC: 8 10*3/uL (ref 4.0–10.5)

## 2016-01-01 LAB — CBC
HCT: 33.1 % — ABNORMAL LOW (ref 36.0–46.0)
Hemoglobin: 10.3 g/dL — ABNORMAL LOW (ref 12.0–15.0)
MCH: 24.5 pg — AB (ref 26.0–34.0)
MCHC: 31.1 g/dL (ref 30.0–36.0)
MCV: 78.8 fL (ref 78.0–100.0)
PLATELETS: 256 10*3/uL (ref 150–400)
RBC: 4.2 MIL/uL (ref 3.87–5.11)
RDW: 17.3 % — ABNORMAL HIGH (ref 11.5–15.5)
WBC: 7.1 10*3/uL (ref 4.0–10.5)

## 2016-01-01 LAB — CREATININE, SERUM
CREATININE: 1.83 mg/dL — AB (ref 0.44–1.00)
GFR, EST AFRICAN AMERICAN: 30 mL/min — AB (ref >60)
GFR, EST NON AFRICAN AMERICAN: 25 mL/min — AB (ref >60)

## 2016-01-01 LAB — TROPONIN I

## 2016-01-01 LAB — GLUCOSE, CAPILLARY
GLUCOSE-CAPILLARY: 201 mg/dL — AB (ref 65–99)
Glucose-Capillary: 72 mg/dL (ref 65–99)

## 2016-01-01 LAB — TSH: TSH: 2.213 u[IU]/mL (ref 0.350–4.500)

## 2016-01-01 LAB — I-STAT TROPONIN, ED: TROPONIN I, POC: 0.02 ng/mL (ref 0.00–0.08)

## 2016-01-01 LAB — BRAIN NATRIURETIC PEPTIDE: B Natriuretic Peptide: 41.6 pg/mL (ref 0.0–100.0)

## 2016-01-01 MED ORDER — LATANOPROST 0.005 % OP SOLN
1.0000 [drp] | Freq: Every day | OPHTHALMIC | Status: DC
  Administered 2016-01-01 – 2016-01-02 (×2): 1 [drp] via OPHTHALMIC
  Filled 2016-01-01: qty 2.5

## 2016-01-01 MED ORDER — METOPROLOL SUCCINATE ER 25 MG PO TB24
25.0000 mg | ORAL_TABLET | Freq: Every day | ORAL | Status: DC
  Administered 2016-01-01 – 2016-01-03 (×3): 25 mg via ORAL
  Filled 2016-01-01 (×3): qty 1

## 2016-01-01 MED ORDER — SUCRALFATE 1 G PO TABS
1.0000 g | ORAL_TABLET | Freq: Three times a day (TID) | ORAL | Status: DC
  Administered 2016-01-01 – 2016-01-03 (×8): 1 g via ORAL
  Filled 2016-01-01 (×11): qty 1

## 2016-01-01 MED ORDER — LUBIPROSTONE 8 MCG PO CAPS
8.0000 ug | ORAL_CAPSULE | Freq: Two times a day (BID) | ORAL | Status: DC
  Administered 2016-01-01 – 2016-01-03 (×4): 8 ug via ORAL
  Filled 2016-01-01 (×6): qty 1

## 2016-01-01 MED ORDER — HYDRALAZINE HCL 50 MG PO TABS
50.0000 mg | ORAL_TABLET | Freq: Two times a day (BID) | ORAL | Status: DC
  Administered 2016-01-01 – 2016-01-03 (×5): 50 mg via ORAL
  Filled 2016-01-01 (×5): qty 1

## 2016-01-01 MED ORDER — DULOXETINE HCL 20 MG PO CPEP
20.0000 mg | ORAL_CAPSULE | Freq: Every day | ORAL | Status: DC
  Administered 2016-01-01 – 2016-01-03 (×3): 20 mg via ORAL
  Filled 2016-01-01 (×3): qty 1

## 2016-01-01 MED ORDER — FUROSEMIDE 10 MG/ML IJ SOLN
40.0000 mg | Freq: Once | INTRAMUSCULAR | Status: AC
  Administered 2016-01-01: 40 mg via INTRAVENOUS
  Filled 2016-01-01: qty 4

## 2016-01-01 MED ORDER — PANTOPRAZOLE SODIUM 40 MG PO TBEC
40.0000 mg | DELAYED_RELEASE_TABLET | Freq: Every day | ORAL | Status: DC
  Administered 2016-01-01 – 2016-01-03 (×3): 40 mg via ORAL
  Filled 2016-01-01 (×3): qty 1

## 2016-01-01 MED ORDER — INSULIN ASPART PROT & ASPART (70-30 MIX) 100 UNIT/ML ~~LOC~~ SUSP
20.0000 [IU] | Freq: Two times a day (BID) | SUBCUTANEOUS | Status: DC
  Administered 2016-01-01 – 2016-01-03 (×4): 20 [IU] via SUBCUTANEOUS
  Filled 2016-01-01: qty 10

## 2016-01-01 MED ORDER — HEPARIN SODIUM (PORCINE) 5000 UNIT/ML IJ SOLN
5000.0000 [IU] | Freq: Three times a day (TID) | INTRAMUSCULAR | Status: DC
Start: 2016-01-01 — End: 2016-01-03
  Administered 2016-01-01 – 2016-01-03 (×6): 5000 [IU] via SUBCUTANEOUS
  Filled 2016-01-01 (×9): qty 1

## 2016-01-01 MED ORDER — HYDROCODONE-ACETAMINOPHEN 10-325 MG PO TABS
1.0000 | ORAL_TABLET | Freq: Four times a day (QID) | ORAL | Status: DC | PRN
  Administered 2016-01-01 – 2016-01-03 (×7): 1 via ORAL
  Filled 2016-01-01 (×7): qty 1

## 2016-01-01 MED ORDER — ASPIRIN EC 81 MG PO TBEC
81.0000 mg | DELAYED_RELEASE_TABLET | Freq: Every day | ORAL | Status: DC
  Administered 2016-01-01 – 2016-01-03 (×3): 81 mg via ORAL
  Filled 2016-01-01 (×4): qty 1

## 2016-01-01 MED ORDER — CILOSTAZOL 100 MG PO TABS
100.0000 mg | ORAL_TABLET | Freq: Every day | ORAL | Status: DC
  Administered 2016-01-01 – 2016-01-03 (×3): 100 mg via ORAL
  Filled 2016-01-01 (×3): qty 1

## 2016-01-01 MED ORDER — TIMOLOL MALEATE 0.5 % OP SOLN
1.0000 [drp] | Freq: Two times a day (BID) | OPHTHALMIC | Status: DC
  Administered 2016-01-01 – 2016-01-03 (×4): 1 [drp] via OPHTHALMIC
  Filled 2016-01-01: qty 5

## 2016-01-01 MED ORDER — BUDESONIDE 0.25 MG/2ML IN SUSP
0.2500 mg | Freq: Two times a day (BID) | RESPIRATORY_TRACT | Status: DC
  Administered 2016-01-01 – 2016-01-03 (×4): 0.25 mg via RESPIRATORY_TRACT
  Filled 2016-01-01 (×4): qty 2

## 2016-01-01 MED ORDER — IBUPROFEN 800 MG PO TABS
800.0000 mg | ORAL_TABLET | Freq: Once | ORAL | Status: AC
  Administered 2016-01-01: 800 mg via ORAL
  Filled 2016-01-01: qty 1

## 2016-01-01 MED ORDER — CYCLOSPORINE 0.05 % OP EMUL
1.0000 [drp] | Freq: Two times a day (BID) | OPHTHALMIC | Status: DC
  Administered 2016-01-01 – 2016-01-03 (×4): 1 [drp] via OPHTHALMIC
  Filled 2016-01-01 (×5): qty 1

## 2016-01-01 MED ORDER — SIMVASTATIN 20 MG PO TABS
20.0000 mg | ORAL_TABLET | Freq: Every day | ORAL | Status: DC
  Administered 2016-01-01 – 2016-01-03 (×3): 20 mg via ORAL
  Filled 2016-01-01 (×3): qty 1

## 2016-01-01 MED ORDER — FUROSEMIDE 10 MG/ML IJ SOLN
40.0000 mg | Freq: Two times a day (BID) | INTRAMUSCULAR | Status: DC
  Administered 2016-01-01 – 2016-01-02 (×3): 40 mg via INTRAVENOUS
  Filled 2016-01-01 (×3): qty 4

## 2016-01-01 MED ORDER — INSULIN ASPART 100 UNIT/ML ~~LOC~~ SOLN
0.0000 [IU] | Freq: Three times a day (TID) | SUBCUTANEOUS | Status: DC
  Administered 2016-01-01: 3 [IU] via SUBCUTANEOUS
  Administered 2016-01-02 – 2016-01-03 (×2): 1 [IU] via SUBCUTANEOUS

## 2016-01-01 NOTE — H&P (Signed)
[facility] History and Physical  Jerome Huskins [id number] DOB: [birth year] DOA: 01/01/2016  Referring physician: ER PCP: [name], NP   Chief Complaint: Shortness of breath   HPI:  80 year old female with a history of diabetes mellitus, hypertension, diastolic heart failure , with most recent echo on 08/13/15 with an EF of 60-65%, stress test on 12/19/15 with an EF of 69%, normal perfusion imaging, recently seen by Dr. Skeet Latch, who presents to the ER with worsening shortness of breath and bilateral lower extremity edema that has slowly been getting worse over the last 2-3 weeks. Symptoms are associated with a chronic productive cough. Patient has a rattle and a postnasal drip for the last 3 weeks. She she states that she's had these symptoms more or less since her recent admission for HCAP between 1/4-1/8, patient was in the ER on 1/28 with complaints of worsening bilateral lower extremity edema was given IV Lasix, advised to continue with Lasix and compression stockings, sodium restriction and discharged home, she returns today with worsening bilateral lower extremity edema despite being on 40 mg of Lasix BID, renal function is slightly better than her baseline of 2.0. ProBNP 41.6. Venous Doppler results pending       Review of Systems: negative for the following  Constitutional: Denies fever, chills, diaphoresis, appetite change and fatigue.  HEENT: Denies photophobia, eye pain, redness, hearing loss, ear pain, congestion, sore throat, rhinorrhea, sneezing, mouth sores, trouble swallowing, neck pain, neck stiffness and tinnitus.  Respiratory: Positive for shortness of breath, cough, postnasal drip.  Cardiovascular: Positive for chest pain and leg swelling.  Gastrointestinal: Denies nausea, vomiting, abdominal pain, diarrhea, constipation, blood in stool and abdominal distention.  Genitourinary: Denies dysuria, urgency, frequency, hematuria, flank pain and difficulty  urinating.  Musculoskeletal: Denies myalgias, back pain, joint swelling, arthralgias and gait problem.  Skin: Denies pallor, rash and wound.  Neurological: Denies dizziness, seizures, syncope, weakness, light-headedness, numbness and headaches.  Hematological: Denies adenopathy. Easy bruising, personal or family bleeding history  Psychiatric/Behavioral: Denies suicidal ideation, mood changes, confusion, nervousness, sleep disturbance and agitation       Past Medical History  Diagnosis Date  . Diabetes mellitus   . Hypertension   . Obese   . Hiatal hernia   . Scoliosis   . Arthritis   . Acute bronchitis   . COPD (chronic obstructive pulmonary disease) ([place])   . Unspecified hereditary and idiopathic peripheral neuropathy   . Secondary diabetes mellitus with renal manifestations, not stated as uncontrolled, or unspecified   . Chronic kidney disease, stage II (mild)   . Chronic kidney disease, stage II (mild)   . Chronic kidney disease, unspecified ([place])   . Disorder of bone and cartilage, unspecified   . Complications affecting other specified body systems, hypertension   . Hypopotassemia   . Anxiety   . Congestive heart failure, unspecified   . GERD (gastroesophageal reflux disease)   . Lumbago   . Insomnia, unspecified   . Hyperlipidemia   . Unspecified glaucoma   . Diaphragmatic hernia without mention of obstruction or gangrene   . Osteoarthrosis, unspecified whether generalized or localized, unspecified site   . Degenerative arthritis of knee, bilateral   . PONV (postoperative nausea and vomiting)      Past Surgical History  Procedure Laterality Date  . Abdominal hysterectomy    . Shoulder surgery    . Total hip arthroplasty      bilateral  . Knee arthroscopy      bilateral  .  Colonoscopy  08/06/2011  . Foot surgery        Social History:  reports that she quit smoking about 18 years ago. Her smoking use included Cigarettes. She has never used smokeless  tobacco. She reports that she does not drink alcohol or use illicit drugs.    Allergies  Allergen Reactions  . Tramadol Other (See Comments)    Leg cramps   . Codeine Nausea And Vomiting    Patient states N/V with codeine  . Hydrocodone Nausea And Vomiting  . Oxycodone Other (See Comments)    Patient states she can tolerate oxycodone  . Tylenol [Acetaminophen] Hives  . Penicillins Rash    Patient states rash/itch with penicillin Has patient had a PCN reaction causing immediate rash, facial/tongue/throat swelling, SOB or lightheadedness with hypotension: Yes- broke me out and i was itching Has patient had a PCN reaction causing severe rash involving mucus membranes or skin necrosis: No Has patient had a PCN reaction that required hospitalization Yes- i was already in the hospital Has patient had a PCN reaction occurring within the last 10 years: No If all of the above answers are "NO    Family History  Problem Relation Age of Onset  . Diabetes Mother   . Heart disease Mother   . Heart disease Brother   . Diabetes Brother   . Kidney disease Father   . Scoliosis Sister   . Diabetes Brother   . Diabetes Brother   . Heart disease Brother   . Scoliosis Brother   . Stroke Brother   . Heart attack Brother          Prior to Admission medications   Medication Sig Start Date End Date Taking? Authorizing Provider  AMITIZA 24 MCG capsule TAKE 1 CAPSULE BY MOUTH 2 TIMES A DAY WITH MEALS Patient taking differently: TAKE 24 MCG BY MOUTH 2 TIMES A DAY WITH MEALS 10/02/15  Yes [name], NP  aspirin EC 81 MG tablet Take 81 mg by mouth daily.   Yes Historical Provider, MD  budesonide (PULMICORT) 0.25 MG/2ML nebulizer solution Take 2 mLs (0.25 mg total) by nebulization 2 (two) times daily. 08/20/15  Yes [name], NP  cilostazol (PLETAL) 100 MG tablet TAKE 1 TABLET BY MOUTH EVERY DAY Patient taking differently: TAKE 100 MG BY MOUTH EVERY DAY 10/30/15  Yes [name],  NP  cloNIDine (CATAPRES) 0.1 MG tablet TAKE 1 TABLET BY MOUTH EVERY DAY Patient taking differently: TAKE 0.1 MG BY MOUTH EVERY DAY 02/07/15  Yes Mahima Pandey, MD  cycloSPORINE (RESTASIS) 0.05 % ophthalmic emulsion Place 1 drop into both eyes 2 (two) times daily. One drop once a day bilaterally for dry eyes   Yes Historical Provider, MD  DULoxetine (CYMBALTA) 30 MG capsule TAKE 1 CAPSULE BY MOUTH EVERY DAY TO HELP ANXIETY AND PAINS Patient taking differently: TAKE 30 MG BY MOUTH EVERY DAY TO HELP ANXIETY AND PAINS 10/30/15  Yes [name], NP  furosemide (LASIX) 40 MG tablet Take 1 tablet (40 mg total) by mouth 2 (two) times daily. 10/29/15  Yes [name], NP  gabapentin (NEURONTIN) 100 MG capsule TAKE 2 CAPSULES BY MOUTH THREE TIMES A DAY Patient taking differently: TAKE 200 MG BY MOUTH THREE TIMES A DAY 10/02/15  Yes [name], NP  guaifenesin (ROBITUSSIN) 100 MG/5ML syrup Take 200 mg by mouth 3 (three) times daily as needed for cough.   Yes Historical Provider, MD  hydrALAZINE (APRESOLINE) 50 MG tablet  TAKE 1 TABLET BY MOUTH TWICE DAILY Patient taking differently: TAKE 50 MG BY MOUTH TWICE DAILY 09/02/15  Yes [name], NP  HYDROcodone-acetaminophen (NORCO) 10-325 MG tablet Take 1 tablet by mouth every 6 (six) hours as needed for moderate pain. 12/10/15  Yes [name], NP  insulin NPH-regular Human (HUMULIN 70/30) (70-30) 100 UNIT/ML injection Inject 40 units subcutaneously with breakfast and 20 units with dinner to control blood sugar. 10/29/15  Yes [name], NP  ipratropium-albuterol (DUONEB) 0.5-2.5 (3) MG/3ML SOLN Take 3 mLs by nebulization every 4 (four) hours as needed. Patient taking differently: Take 3 mLs by nebulization every 4 (four) hours as needed (SOB, wheezing).  08/21/15  Yes Benjamin Cartner, PA-C  magnesium oxide (MAG-OX) 400 MG tablet Take 1 tablet (400 mg total) by mouth 2 (two) times daily. 11/06/15  Yes [name], NP   metoprolol succinate (TOPROL-XL) 25 MG 24 hr tablet TAKE 1 TABLET BY MOUTH EVERY DAY WITH OR IMMEDIATELY FOLLOWING A MEAL FOR BLOOD PRESSURE 12/25/15  Yes [name], NP  NEXIUM 40 MG capsule TAKE 1 CAPSULE BY MOUTH ONCE DAILY Patient taking differently: TAKE 40 MG BY MOUTH ONCE DAILY 10/30/15  Yes [name], NP  potassium chloride SA (K-DUR,KLOR-CON) 20 MEQ tablet TAKE 1 TABLET BY MOUTH EVERY DAY Patient taking differently: TAKE 20 MEQ BY MOUTH EVERY DAY 09/02/15  Yes [name], NP  sertraline (ZOLOFT) 50 MG tablet TAKE 1 TABLET BY MOUTH EVERY DAY 12/25/15  Yes [name], NP  simvastatin (ZOCOR) 20 MG tablet TAKE 1 TABLET BY MOUTH EVERY DAY 12/25/15  Yes [name], NP  sucralfate (CARAFATE) 1 g tablet TAKE 1 TABLET BY MOUTH TWICE DAILY ON EMPTY STOMACH Patient taking differently: TAKE 1 G BY MOUTH TWICE DAILY ON EMPTY STOMACH 11/28/15  Yes [name], NP  timolol (TIMOPTIC) 0.5 % ophthalmic solution Place 1 drop into both eyes 2 (two) times daily.    Yes Historical Provider, MD  Travoprost, BAK Free, (TRAVATAN) 0.004 % SOLN ophthalmic solution Place 1 drop into both eyes at bedtime. One drop bilaterally twice a day   Yes Historical Provider, MD  VENTOLIN HFA 108 (90 Base) MCG/ACT inhaler INHALE 1 PUFF 4 TIMES A DAY FOR 5 DAYS, THEN USE EVERY 6 HOURS AS NEEDED Patient taking differently: INHALE 1 PUFF 4 TIMES A DAY FOR 5 DAYS, THEN USE EVERY 6 HOURS AS NEEDED for shortness of breath/wheezing 12/23/15  Yes [name], NP  VOLTAREN 1 % GEL APPLY 4 GRAM TOPICALLY TWICE DAILY Patient taking differently: APPLY 4 GRAM TOPICALLY TWICE DAILY as needed for pain 10/02/15  Yes [name], NP  EASY TOUCH INSULIN SYRINGE 31G X 5/16" 0.5 ML MISC USE UP TO 2 TIMES A DAY AS DIRECTED 11/28/15   [name], NP  levofloxacin (LEVAQUIN) 750 MG tablet Take 1 tablet (750 mg total) by mouth every other day. Patient not taking: Reported on 12/20/2015 12/08/15    Nat Math, MD     Physical Exam: Filed Vitals:   01/01/16 0913 01/01/16 1124 01/01/16 1336 01/01/16 1417  BP: 164/62 133/72  140/70  Pulse: 59 58  61  Temp: 98.4 F (36.9 C) 98.3 F (36.8 C)  98.5 F (36.9 C)  TempSrc: Oral Oral  Oral  Resp: 16 19  18   Height:   5\' 2"  (1.575 m)   Weight:   106.3 kg (234 lb 5.6 oz)   SpO2: 95% 95%  95%     Constitutional: Vital signs reviewed. Patient is a well-developed and well-nourished in no acute distress and cooperative with exam. Alert and oriented x3.  Head: Normocephalic and atraumatic  Ear: TM normal bilaterally  Mouth: no erythema or exudates, MMM  Eyes: PERRL, EOMI, conjunctivae normal, No scleral icterus.  Neck: Supple, Trachea midline normal ROM, No JVD, mass, thyromegaly, or carotid bruit present.  Cardiovascular: RRR, S1 normal, S2 normal, no MRG, pulses symmetric and intact bilaterally  Pulmonary/Chest: CTAB, no wheezes, rales, or rhonchi  Abdominal: Soft. Non-tender, non-distended, bowel sounds are normal, no masses, organomegaly, or guarding present.  GU: no CVA tenderness Patient has +3 pitting edema at ankle. +1 to +2 pitting edema to mid thigh. Bilaterally. Ext: no edema and no cyanosis, pulses palpable bilaterally (DP and PT)  Hematology: no cervical, inginal, or axillary adenopathy.  Neurological: A&O x3, Strenght is normal and symmetric bilaterally, cranial nerve II-XII are grossly intact, no focal motor deficit, sensory intact to light touch bilaterally.  Skin: Warm, dry and intact. No rash, cyanosis, or clubbing.  Psychiatric: Normal mood and affect. speech and behavior is normal. Judgment and thought content normal. Cognition and memory are normal.      Data Review   Micro Results No results found for this or any previous visit (from the past 240 hour(s)).  Radiology Reports Dg Chest 2 View  01/01/2016  CLINICAL DATA:  Bilateral leg swelling with increasing cough, congestion and wheezing for several days.  History of congestive heart failure. EXAM: CHEST  2 VIEW COMPARISON:  12/28/2015 and 12/04/2015) FINDINGS: There is stable cardiomegaly and chronic vascular congestion. There is right hemidiaphragm eventration with minimal adjacent basilar atelectasis. No confluent airspace opacity, edema or significant pleural effusion identified. Asymmetric glenohumeral degenerative changes on the left appear chronic. IMPRESSION: Stable cardiomegaly and chronic vascular congestion. No acute chest findings. Electronically Signed   By: Richardean Sale M.D.   On: 01/01/2016 10:15   Dg Chest 2 View  12/28/2015  CLINICAL DATA:  Shortness of breath for 1 week. Lower extremity swelling. Acute on chronic diastolic congestive heart failure. Acute respiratory failure with hypoxia. EXAM: CHEST  2 VIEW COMPARISON:  12/04/2015 FINDINGS: Mild to moderate cardiomegaly remains stable. Probable hiatal hernia again noted. There has been interval resolution of opacity in the right perihilar region since previous study, consistent with resolving atelectasis or infiltrate. No new areas of pulmonary opacity are seen. No pneumothorax or pleural effusion visualized. Thoracic spine degenerative changes again noted. IMPRESSION: Resolution of right perihilar atelectasis or infiltrate since previous study. No acute findings. Stable cardiomegaly and probable hiatal hernia. Electronically Signed   By: Earle Gell M.D.   On: 12/28/2015 14:46   Dg Chest 2 View  12/04/2015  CLINICAL DATA:  Cough and congestion for 2 weeks. Shortness of breath. EXAM: CHEST  2 VIEW COMPARISON:  09/10/2015 FINDINGS: Chronic eventration along the anterior right hemidiaphragm. Mild enlargement of cardiac silhouette appears unchanged. Retrocardiac opacity is compatible with a hiatal hernia. There are linear densities in the right lower lung. No focal airspace disease in the left lung. Degenerative changes in the left shoulder joint. IMPRESSION: New densities in the right lower  chest. Findings could represent focus of infection and/or atelectasis. Followup PA and lateral chest X-ray is recommended in 3-4 weeks following trial of antibiotic therapy to ensure resolution and exclude underlying malignancy. Electronically Signed   By: Markus Daft M.D.   On: 12/04/2015 22:11     CBC  Recent Labs Lab  12/28/15 1424 01/01/16 1022 01/01/16 1246  WBC 7.6 8.0 7.1  HGB 10.6* 10.7* 10.3*  HCT 33.6* 34.1* 33.1*  PLT 268 285 256  MCV 78.1 78.6 78.8  MCH 24.7* 24.7* 24.5*  MCHC 31.5 31.4 31.1  RDW 16.9* 17.2* 17.3*  LYMPHSABS 2.0 2.4  --   MONOABS 0.5 0.7  --   EOSABS 0.5 0.5  --   BASOSABS 0.0 0.0  --     Chemistries   Recent Labs Lab 12/28/15 1424 01/01/16 1022 01/01/16 1246  NA 144 140  --   K 4.0 3.7  --   CL 103 104  --   CO2 28 24  --   GLUCOSE 99 126*  --   BUN 35* 25*  --   CREATININE 2.03* 1.85* 1.83*  CALCIUM 9.8 9.5  --   MG  --   --  2.3  AST 17 15  --   ALT 9* 10*  --   ALKPHOS 105 110  --   BILITOT 0.2* 0.3  --    ------------------------------------------------------------------------------------------------------------------ estimated creatinine clearance is 29.5 mL/min (by C-G formula based on Cr of 1.83). ------------------------------------------------------------------------------------------------------------------ No results for input(s): HGBA1C in the last 72 hours. ------------------------------------------------------------------------------------------------------------------ No results for input(s): CHOL, HDL, LDLCALC, TRIG, CHOLHDL, LDLDIRECT in the last 72 hours. ------------------------------------------------------------------------------------------------------------------  Recent Labs  01/01/16 1246  TSH 2.213   ------------------------------------------------------------------------------------------------------------------ No results for input(s): VITAMINB12, FOLATE, FERRITIN, TIBC, IRON, RETICCTPCT in the last 72  hours.  Coagulation profile No results for input(s): INR, PROTIME in the last 168 hours.  No results for input(s): DDIMER in the last 72 hours.  Cardiac Enzymes  Recent Labs Lab 12/28/15 1424 01/01/16 1246  TROPONINI <0.03 <0.03   ------------------------------------------------------------------------------------------------------------------ Invalid input(s): POCBNP   CBG: No results for input(s): GLUCAP in the last 168 hours.     EKG: Independently reviewed.   Assessment/Plan 1. Acute on chronic   diastolic CHF with bilateral lower extremity edema - TTE (08/13/15) with EF 60-65%, not adequate for assess diastolic dysfunction, but on earlier study from 2013, grade II diastolic dysfunction documented , repeat 2-D echo today showed the echo 65-70% Chest x-ray shows cardiomegaly with chronic pulmonary vascular congestion, no pneumonia Continue Lasix 40 mg IV BID,   - Continue metoprolol, ASA, statin,   - Daily wts, strict I/Os  TSH 2.2 Venous lower extremity Doppler to rule out DVT   2. COPD without exacerbation  - Not requiring supplemental O2 at home  95% on room air - Conitnue home regimen of DuoNebs prn, Symbicort     3. Insulin-dependent DM with neuropathy Continue insulin 70/30, 20 units twice a day with sliding scale insulin Most recent hemoglobin A1c 7.6 on 12/05/15  5. ASCVD  Cardiac enzymes negative, patient recently had a stress test that was negative - Continue ASA 81, statin, metoprolol  - Will continue home-dose cilostazol for PAD   6. Hypokalemia  - Check mag  - Replacing IV, PO  - Repeat chem panel in am   Anticipate patient will be able to discharge in one to 2 days   CODE STATUS-DO NOT RESUSCITATE based on prior admissions     Family Communication: bedside Disposition Plan: admit   Total time spent 55 minutes.Greater than 50% of this time was spent in counseling, explanation of diagnosis, planning of further management,  and coordination of care  [facility] Pager [phone]  If 7PM-7AM, please contact night-coverage [email or web address] Password [facility] 01/01/2016, 3:45 PM

## 2016-01-01 NOTE — ED Notes (Signed)
20 minute timer 13:22

## 2016-01-01 NOTE — Progress Notes (Signed)
  Echocardiogram 2D Echocardiogram has been performed.  Michelle Buck 01/01/2016, 2:19 PM

## 2016-01-01 NOTE — ED Provider Notes (Signed)
CSN: [id number]     Arrival date & time 01/01/16  0904 History   First MD Initiated Contact with Patient 01/01/16 [phone]     Chief Complaint  Patient presents with  . Leg Swelling     (Consider location/radiation/quality/duration/timing/severity/associated sxs/prior Treatment) HPI   Patient is a 80 year old female with history of hypertension diabetes obesity COPD and CK D presenting today with bilateral leg swelling. Patient is presented for the second time in the last 2 weeks for similar complaint. Patient has noted that for last week and half she's having increasing swelling. She was seen on Saturday for the same thing given 1 dose of IV Lasix and sent home on her home Lasix. She feels like she is been urinating less than usual. She feels mildly short of breath. She's noted the swelling has gone from just her ankles to her knees and now is midway up her thighs.  She is on 40 mg of Lasix twice a day.  Past Medical History  Diagnosis Date  . Diabetes mellitus   . Hypertension   . Obese   . Hiatal hernia   . Scoliosis   . Arthritis   . Acute bronchitis   . COPD (chronic obstructive pulmonary disease) ([place])   . Unspecified hereditary and idiopathic peripheral neuropathy   . Secondary diabetes mellitus with renal manifestations, not stated as uncontrolled, or unspecified   . Chronic kidney disease, stage II (mild)   . Chronic kidney disease, stage II (mild)   . Chronic kidney disease, unspecified ([place])   . Disorder of bone and cartilage, unspecified   . Complications affecting other specified body systems, hypertension   . Hypopotassemia   . Anxiety   . Congestive heart failure, unspecified   . GERD (gastroesophageal reflux disease)   . Lumbago   . Insomnia, unspecified   . Hyperlipidemia   . Unspecified glaucoma   . Diaphragmatic hernia without mention of obstruction or gangrene   . Osteoarthrosis, unspecified whether generalized or localized, unspecified site   . Degenerative  arthritis of knee, bilateral   . PONV (postoperative nausea and vomiting)    Past Surgical History  Procedure Laterality Date  . Abdominal hysterectomy    . Shoulder surgery    . Total hip arthroplasty      bilateral  . Knee arthroscopy      bilateral  . Colonoscopy  08/06/2011  . Foot surgery     Family History  Problem Relation Age of Onset  . Diabetes Mother   . Heart disease Mother   . Heart disease Brother   . Diabetes Brother   . Kidney disease Father   . Scoliosis Sister   . Diabetes Brother   . Diabetes Brother   . Heart disease Brother   . Scoliosis Brother   . Stroke Brother   . Heart attack Brother    Social History  Substance Use Topics  . Smoking status: Former Smoker    Types: Cigarettes    Quit date: 02/27/1997  . Smokeless tobacco: Never Used  . Alcohol Use: No   OB History    No data available     Review of Systems  Constitutional: Negative for activity change.  HENT: Negative for congestion.   Respiratory: Positive for shortness of breath.   Cardiovascular: Positive for chest pain and leg swelling.  Gastrointestinal: Negative for abdominal pain.  Genitourinary: Negative for dysuria.  Musculoskeletal: Negative for back pain.  Neurological: Negative for dizziness and syncope.  Psychiatric/Behavioral: Negative for  agitation.  All other systems reviewed and are negative.     Allergies  Tramadol; Codeine; Hydrocodone; Oxycodone; Tylenol; and Penicillins  Home Medications   Prior to Admission medications   Medication Sig Start Date End Date Taking? Authorizing Provider  AMITIZA 24 MCG capsule TAKE 1 CAPSULE BY MOUTH 2 TIMES A DAY WITH MEALS Patient taking differently: TAKE 24 MCG BY MOUTH 2 TIMES A DAY WITH MEALS 10/02/15  Yes [name], NP  aspirin EC 81 MG tablet Take 81 mg by mouth daily.   Yes Historical Provider, MD  budesonide (PULMICORT) 0.25 MG/2ML nebulizer solution Take 2 mLs (0.25 mg total) by nebulization 2 (two) times  daily. 08/20/15  Yes [name], NP  cilostazol (PLETAL) 100 MG tablet TAKE 1 TABLET BY MOUTH EVERY DAY Patient taking differently: TAKE 100 MG BY MOUTH EVERY DAY 10/30/15  Yes [name], NP  cloNIDine (CATAPRES) 0.1 MG tablet TAKE 1 TABLET BY MOUTH EVERY DAY Patient taking differently: TAKE 0.1 MG BY MOUTH EVERY DAY 02/07/15  Yes Mahima Pandey, MD  cycloSPORINE (RESTASIS) 0.05 % ophthalmic emulsion Place 1 drop into both eyes 2 (two) times daily. One drop once a day bilaterally for dry eyes   Yes Historical Provider, MD  DULoxetine (CYMBALTA) 30 MG capsule TAKE 1 CAPSULE BY MOUTH EVERY DAY TO HELP ANXIETY AND PAINS Patient taking differently: TAKE 30 MG BY MOUTH EVERY DAY TO HELP ANXIETY AND PAINS 10/30/15  Yes [name], NP  furosemide (LASIX) 40 MG tablet Take 1 tablet (40 mg total) by mouth 2 (two) times daily. 10/29/15  Yes [name], NP  gabapentin (NEURONTIN) 100 MG capsule TAKE 2 CAPSULES BY MOUTH THREE TIMES A DAY Patient taking differently: TAKE 200 MG BY MOUTH THREE TIMES A DAY 10/02/15  Yes [name], NP  guaifenesin (ROBITUSSIN) 100 MG/5ML syrup Take 200 mg by mouth 3 (three) times daily as needed for cough.   Yes Historical Provider, MD  hydrALAZINE (APRESOLINE) 50 MG tablet TAKE 1 TABLET BY MOUTH TWICE DAILY Patient taking differently: TAKE 50 MG BY MOUTH TWICE DAILY 09/02/15  Yes [name], NP  HYDROcodone-acetaminophen (NORCO) 10-325 MG tablet Take 1 tablet by mouth every 6 (six) hours as needed for moderate pain. 12/10/15  Yes [name], NP  insulin NPH-regular Human (HUMULIN 70/30) (70-30) 100 UNIT/ML injection Inject 40 units subcutaneously with breakfast and 20 units with dinner to control blood sugar. 10/29/15  Yes [name], NP  ipratropium-albuterol (DUONEB) 0.5-2.5 (3) MG/3ML SOLN Take 3 mLs by nebulization every 4 (four) hours as needed. Patient taking differently: Take 3 mLs by nebulization every 4 (four) hours as  needed (SOB, wheezing).  08/21/15  Yes Benjamin Cartner, PA-C  magnesium oxide (MAG-OX) 400 MG tablet Take 1 tablet (400 mg total) by mouth 2 (two) times daily. 11/06/15  Yes [name], NP  metoprolol succinate (TOPROL-XL) 25 MG 24 hr tablet TAKE 1 TABLET BY MOUTH EVERY DAY WITH OR IMMEDIATELY FOLLOWING A MEAL FOR BLOOD PRESSURE 12/25/15  Yes [name], NP  NEXIUM 40 MG capsule TAKE 1 CAPSULE BY MOUTH ONCE DAILY Patient taking differently: TAKE 40 MG BY MOUTH ONCE DAILY 10/30/15  Yes [name], NP  potassium chloride SA (K-DUR,KLOR-CON) 20 MEQ tablet TAKE 1 TABLET BY MOUTH EVERY DAY Patient taking differently: TAKE 20 MEQ BY MOUTH EVERY DAY 09/02/15  Yes [name], NP  sertraline (ZOLOFT) 50 MG tablet TAKE 1 TABLET BY MOUTH  EVERY DAY 12/25/15  Yes [name], NP  simvastatin (ZOCOR) 20 MG tablet TAKE 1 TABLET BY MOUTH EVERY DAY 12/25/15  Yes [name], NP  sucralfate (CARAFATE) 1 g tablet TAKE 1 TABLET BY MOUTH TWICE DAILY ON EMPTY STOMACH Patient taking differently: TAKE 1 G BY MOUTH TWICE DAILY ON EMPTY STOMACH 11/28/15  Yes [name], NP  timolol (TIMOPTIC) 0.5 % ophthalmic solution Place 1 drop into both eyes 2 (two) times daily.    Yes Historical Provider, MD  Travoprost, BAK Free, (TRAVATAN) 0.004 % SOLN ophthalmic solution Place 1 drop into both eyes at bedtime. One drop bilaterally twice a day   Yes Historical Provider, MD  VENTOLIN HFA 108 (90 Base) MCG/ACT inhaler INHALE 1 PUFF 4 TIMES A DAY FOR 5 DAYS, THEN USE EVERY 6 HOURS AS NEEDED Patient taking differently: INHALE 1 PUFF 4 TIMES A DAY FOR 5 DAYS, THEN USE EVERY 6 HOURS AS NEEDED for shortness of breath/wheezing 12/23/15  Yes [name], NP  VOLTAREN 1 % GEL APPLY 4 GRAM TOPICALLY TWICE DAILY Patient taking differently: APPLY 4 GRAM TOPICALLY TWICE DAILY as needed for pain 10/02/15  Yes [name], NP  EASY TOUCH INSULIN SYRINGE 31G X 5/16" 0.5 ML MISC USE UP TO 2 TIMES A  DAY AS DIRECTED 11/28/15   [name], NP  levofloxacin (LEVAQUIN) 750 MG tablet Take 1 tablet (750 mg total) by mouth every other day. Patient not taking: Reported on 12/20/2015 12/08/15   Simbiso Ranga, MD   BP 140/70 mmHg  Pulse 61  Temp(Src) 98.5 F (36.9 C) (Oral)  Resp 18  Ht 5\' 2"  (1.575 m)  Wt 234 lb 5.6 oz (106.3 kg)  BMI 42.85 kg/m2  SpO2 95% Physical Exam  Constitutional: She is oriented to person, place, and time. She appears well-developed and well-nourished.  Obese elderly African-American female.  HENT:  Head: Normocephalic and atraumatic.  Eyes: Conjunctivae are normal. Right eye exhibits no discharge.  Neck: Neck supple.  Cardiovascular: Normal rate, regular rhythm and normal heart sounds.   No murmur heard. Pulmonary/Chest: Effort normal and breath sounds normal. She has no wheezes.  Abdominal: Soft. She exhibits no distension. There is no tenderness.  Musculoskeletal: Normal range of motion.  Patient has +3 pitting edema at ankle. +1 to +2 pitting edema to mid thigh. Bilaterally.  Neurological: She is oriented to person, place, and time. No cranial nerve deficit.  Skin: Skin is warm and dry. No rash noted. She is not diaphoretic.  Psychiatric: She has a normal mood and affect. Her behavior is normal.  Nursing note and vitals reviewed.   ED Course  Procedures (including critical care time) Labs Review Labs Reviewed  CBC WITH DIFFERENTIAL/PLATELET - Abnormal; Notable for the following:    Hemoglobin 10.7 (*)    HCT 34.1 (*)    MCH 24.7 (*)    RDW 17.2 (*)    All other components within normal limits  COMPREHENSIVE METABOLIC PANEL - Abnormal; Notable for the following:    Glucose, Bld 126 (*)    BUN 25 (*)    Creatinine, Ser 1.85 (*)    ALT 10 (*)    GFR calc non Af Amer 25 (*)    GFR calc Af Amer 29 (*)    All other components within normal limits  CBC - Abnormal; Notable for the following:    Hemoglobin 10.3 (*)    HCT 33.1 (*)    MCH 24.5  (*)  RDW 17.3 (*)    All other components within normal limits  CREATININE, SERUM - Abnormal; Notable for the following:    Creatinine, Ser 1.83 (*)    GFR calc non Af Amer 25 (*)    GFR calc Af Amer 30 (*)    All other components within normal limits  BRAIN NATRIURETIC PEPTIDE  MAGNESIUM  TSH  TROPONIN I  HEMOGLOBIN A1C  TROPONIN I  TROPONIN I  I-STAT TROPOININ, ED    Imaging Review Dg Chest 2 View  01/01/2016  CLINICAL DATA:  Bilateral leg swelling with increasing cough, congestion and wheezing for several days. History of congestive heart failure. EXAM: CHEST  2 VIEW COMPARISON:  12/28/2015 and 12/04/2015) FINDINGS: There is stable cardiomegaly and chronic vascular congestion. There is right hemidiaphragm eventration with minimal adjacent basilar atelectasis. No confluent airspace opacity, edema or significant pleural effusion identified. Asymmetric glenohumeral degenerative changes on the left appear chronic. IMPRESSION: Stable cardiomegaly and chronic vascular congestion. No acute chest findings. Electronically Signed   By: Richardean Sale M.D.   On: 01/01/2016 10:15   I have personally reviewed and evaluated these images and lab results as part of my medical decision-making.   EKG Interpretation   Date/Time:  Wednesday January 01 2016 10:13:10 EST Ventricular Rate:  51 PR Interval:  131 QRS Duration: 88 QT Interval:  406 QTC Calculation: 374 R Axis:   57 Text Interpretation:  Sinus rhythm Multiple premature complexes, vent &  supraven Low voltage, precordial leads no acute ischemia Confirmed by  Gerald Leitz ([id number]) on 01/01/2016 10:15:11 AM      MDM   Final diagnoses:  Bilateral edema of lower extremity    She is a very pleasant obese 80 year old female with history of hypertension hyperlipidemia CK D COPD and CHF. She is presenting with increasing swelling. Patient has failed outpatient treatment of this complaint. Patient was given a single dose of IV Lasix  and has been on 40 mg of Lasix twice a day for the last week. She's noticed increased swelling and increased shortness of breath. She is swelling to mid thigh. I think patient will benefit from inpatient admission for IV diuretics.  Gwendola Hornaday Julio Alm, MD 01/01/16 [phone]

## 2016-01-01 NOTE — ED Notes (Signed)
She c/io persistent swelling in bilat. Legs and congested cough.  She denies fevers/night sweats and is in no distress.

## 2016-01-01 NOTE — Progress Notes (Signed)
Pt arrived to floor from ED on stretcher, stand and pivot to eob w/ stand by assist. VSS. Assessment as charted. Pt oriented to callbell and environment. POC discussed. Denies pain. Echo being done bedside. Will monitor.

## 2016-01-01 NOTE — Progress Notes (Signed)
*  PRELIMINARY RESULTS* Vascular Ultrasound Lower extremity venous duplex has been completed.  Preliminary findings: No evidence of DVT or baker's cyst.   Landry Mellow, RDMS, RVT  01/01/2016, 3:24 PM

## 2016-01-01 NOTE — Patient Outreach (Signed)
[facility]) Care Management  01/01/2016  Michelle Buck Jul 29, 1936 [id number]  Telephone Assessment  RN spoke with pt today and introduced the [facility] program and services and purpose for today's call. Pt verbalized an understanding and was receptive to the call. Discussed pt's ongoing medical issues related to HF. Pt states she recently had a ED visit related to swelling to her legs at [facility]. States her weight was 231 lbs on yesterday and improving today but unable to recite the reading. RN inquired further on her knowledge base and briefly educated pt on the HF zones. Educated pt the the GREEN/YELLOW/RED zone and what to do if acute symptoms occur. Discussed if she gains 3 lbs over night or 5 lbs in one week to contact her provider due to fluid retention. Verified pt is in the GREEN zone as her swelling is continue to improve. Pt reports she has involved services with a PCS worker who visits M-F to assist with her daily activities. Pt states due to other medical conditions she is unable to walk due to  "no disk" in her back. RN extended more involvement with community home visits to further educate her on her medical condition HF (pt receptive). Arranged a scheduled appointment for the initial home visit on 2/8. RN will provide education material and tools to assist pt in managing her care more independently.  [name], RN Care Management Coordinator [place] Office [phone]

## 2016-01-02 DIAGNOSIS — I509 Heart failure, unspecified: Secondary | ICD-10-CM

## 2016-01-02 DIAGNOSIS — I5031 Acute diastolic (congestive) heart failure: Secondary | ICD-10-CM

## 2016-01-02 LAB — BASIC METABOLIC PANEL
Anion gap: 11 (ref 5–15)
BUN: 26 mg/dL — AB (ref 6–20)
CALCIUM: 9.1 mg/dL (ref 8.9–10.3)
CO2: 29 mmol/L (ref 22–32)
Chloride: 102 mmol/L (ref 101–111)
Creatinine, Ser: 1.9 mg/dL — ABNORMAL HIGH (ref 0.44–1.00)
GFR calc Af Amer: 28 mL/min — ABNORMAL LOW (ref >60)
GFR, EST NON AFRICAN AMERICAN: 24 mL/min — AB (ref >60)
GLUCOSE: 139 mg/dL — AB (ref 65–99)
Potassium: 3.4 mmol/L — ABNORMAL LOW (ref 3.5–5.1)
Sodium: 142 mmol/L (ref 135–145)

## 2016-01-02 LAB — GLUCOSE, CAPILLARY
GLUCOSE-CAPILLARY: 120 mg/dL — AB (ref 65–99)
GLUCOSE-CAPILLARY: 113 mg/dL — AB (ref 65–99)
Glucose-Capillary: 126 mg/dL — ABNORMAL HIGH (ref 65–99)
Glucose-Capillary: 85 mg/dL (ref 65–99)

## 2016-01-02 LAB — HEMOGLOBIN A1C
Hgb A1c MFr Bld: 8 % — ABNORMAL HIGH (ref 4.8–5.6)
MEAN PLASMA GLUCOSE: 183 mg/dL

## 2016-01-02 LAB — TROPONIN I

## 2016-01-02 MED ORDER — POTASSIUM CHLORIDE CRYS ER 20 MEQ PO TBCR
40.0000 meq | EXTENDED_RELEASE_TABLET | Freq: Two times a day (BID) | ORAL | Status: DC
  Administered 2016-01-02 – 2016-01-03 (×3): 40 meq via ORAL
  Filled 2016-01-02 (×3): qty 2

## 2016-01-02 MED ORDER — POLYETHYLENE GLYCOL 3350 17 G PO PACK
17.0000 g | PACK | Freq: Every day | ORAL | Status: DC
  Administered 2016-01-02 – 2016-01-03 (×2): 17 g via ORAL
  Filled 2016-01-02 (×2): qty 1

## 2016-01-02 NOTE — Progress Notes (Signed)
Report received from C. Addison, RN. No change from initial pm assessment. Will continue to monitor and follow the POC.  

## 2016-01-02 NOTE — Evaluation (Signed)
Physical Therapy Evaluation [name] MRN: [id number] DOB: 07/13/1936 Today's Date: 01/02/2016   History of Present Illness  80 yo female admitted with HF, LE edema. hx of COPD,CHF, DM, HTN, obesity, COPD, arthritis, lumbago  Clinical Impression  On eval, pt was supervision level assist for mobility-walked ~50 feet with RW. Fatigues easily. Dyspnea 3/4. At this time, will recommend HHPT follow up if pt is agreeable. Recommend daily ambulation with nursing supervision    Follow Up Recommendations Home health PT;Supervision - Intermittent (if pt agreeable and depending on progress)    Equipment Recommendations  None recommended by PT    Recommendations for Other Services       Precautions / Restrictions Precautions Precautions: Fall Restrictions Weight Bearing Restrictions: No      Mobility  Bed Mobility Overal bed mobility: Needs Assistance Bed Mobility: Supine to Sit;Sit to Supine     Supine to sit: Supervision Sit to supine: Supervision   General bed mobility comments:  for safety. Increased time  Transfers Overall transfer level: Needs assistance Equipment used: Rolling walker (2 wheeled) Transfers: Sit to/from Stand Sit to Stand: Supervision         General transfer comment: for safety. increased time  Ambulation/Gait Ambulation/Gait assistance: Supervision Ambulation Distance (Feet): 50 Feet Assistive device: Rolling walker (2 wheeled) Gait Pattern/deviations: Step-through pattern;Trunk flexed     General Gait Details: slow gait speed with multiple standing rest break (forearms resting on walker handles). followed closely with recliner. dyspnea 3/4. pt fatigues easily.   Stairs            Wheelchair Mobility    Modified Rankin (Stroke Patients Only)       Balance Overall balance assessment: Needs assistance         Standing balance support: During functional activity Standing balance-Leahy Scale: Fair                                Pertinent Vitals/Pain Pain Assessment: Faces Faces Pain Scale: Hurts little more Pain Location: bil Le, hips, ankles Pain Descriptors / Indicators: Sore;Aching Pain Intervention(s): Monitored during session;Repositioned    Home Living Family/patient expects to be discharged to:: Private residence       Home Access: Ramped entrance     Home Layout: One level Home Equipment: Walker - 4 wheels;Hand held shower head;Shower seat;Grab bars - toilet      Prior Function Level of Independence: Needs assistance   Gait / Transfers Assistance Needed: uses rollator independently at home  ADL's / Homemaking Assistance Needed: aide comes to assist with bathing  Comments: assistance with bathing/dressing. Gets to bathroom on her own with rollator     Hand Dominance        Extremity/Trunk Assessment   Upper Extremity Assessment: Generalized weakness           Lower Extremity Assessment: Generalized weakness (noted external rotation both feet, R worse than L)      Cervical / Trunk Assessment: Kyphotic  Communication   Communication: No difficulties  Cognition Arousal/Alertness: Awake/alert Behavior During Therapy: WFL for tasks assessed/performed Overall Cognitive Status: Within Functional Limits for tasks assessed                      General Comments      Exercises        Assessment/Plan    PT Assessment Patient needs continued PT services  PT Diagnosis Difficulty walking;Generalized  weakness   PT Problem List Decreased activity tolerance;Decreased balance;Decreased mobility;Decreased strength;Obesity;Pain  PT Treatment Interventions DME instruction;Gait training;Therapeutic activities;Patient/family education;Functional mobility training;Balance training;Therapeutic exercise   PT Goals (Current goals can be found in the Care Plan section) Acute Rehab PT Goals Patient Stated Goal: to get some rest PT Goal Formulation:  With patient Time For Goal Achievement: 01/16/16 Potential to Achieve Goals: Good    Frequency Min 3X/week   Barriers to discharge        Co-evaluation               End of Session   Activity Tolerance: Patient limited by fatigue Patient left: in bed;with call bell/phone within reach;with bed alarm set           Time: 1020-1037 PT Time Calculation (min) (ACUTE ONLY): 17 min   Charges:   PT Evaluation $PT Eval Moderate Complexity: 1 Procedure     PT G Codes:        [name], MPT Pager: [phone]

## 2016-01-02 NOTE — Progress Notes (Signed)
[facility] PROGRESS NOTE  Michelle Buck [id number] DOB: 22-Mar-1936 DOA: 01/01/2016 PCP: [name], NP  Length of stay: 1   Assessment/Plan: Principal Problem:   Acute on chronic diastolic congestive heart failure (HCC) Active Problems:   Diabetes mellitus type 2 in obese ([place])   Essential hypertension, benign   Morbid obesity ([place])   Chronic kidney disease (CKD), stage IV (severe) (HCC)   COPD (chronic obstructive pulmonary disease) (HCC)   Leg edema   Acute CHF (congestive heart failure) ([place])   80 year old female with a history of diabetes mellitus, hypertension, diastolic heart failure , with most recent echo on 08/13/15 with an EF of 60-65%, stress test on 12/19/15 with an EF of 69%, normal perfusion imaging, recently seen by Dr. Skeet Latch, who presents to the ER with worsening shortness of breath and bilateral lower extremity edema that has slowly been getting worse over the last 2-3 weeks. Symptoms are associated with a chronic productive cough. Patient has a rattle and a postnasal drip for the last 3 weeks. She she states that she's had these symptoms more or less since her recent admission for HCAP between 1/4-1/8, patient was in the ER on 1/28 with complaints of worsening bilateral lower extremity edema was given IV Lasix, advised to continue with Lasix and compression stockings, sodium restriction and discharged home, she returns today with worsening bilateral lower extremity edema despite being on 40 mg of Lasix BID, renal function is slightly better than her baseline of 2.0. ProBNP 41.6. Venous Doppler results pending  Assessment and plan  1. Acute on chronic diastolic CHF with bilateral lower extremity edema  EF 65-70%  grade II diastolic dysfunction documented on 01/01/16 , Chest x-ray shows cardiomegaly with chronic pulmonary vascular congestion, no pneumonia increase to Lasix 60 mg IV BID, only 1300 UOP since admission - Continue metoprolol,  ASA, statin,  - Daily wts, strict I/Os  TSH 2.2 Venous lower extremity Doppler to rule out DVT, negative    2. COPD without exacerbation  - Not requiring supplemental O2 at home  95% on room air - Conitnue home regimen of DuoNebs prn, Symbicort    3. Insulin-dependent DM with neuropathy Continue insulin 70/30, 20 units twice a day with sliding scale insulin   hemoglobin A1c 8.0  5. ASCVD  Cardiac enzymes negative times 3, patient recently had a stress test that was negative - Continue ASA 81, statin, metoprolol  - Will continue home-dose cilostazol for PAD   6. Hypokalemia    mag 2.3 - Replacing IV, PO  - Repeat chem panel in am     DVT prophylaxsis heparin  Code Status:      Code Status Orders    DNR    Start     Ordered     Family Communication: Discussed in detail with the patient, all imaging results, lab results explained to the patient   Disposition Plan:  Dc tomorrow        Consultants:  None   Procedures:  None   Antibiotics: Anti-infectives    None         HPI/Subjective: Improved sob,cough , no cp  Objective: Filed Vitals:   01/01/16 1417 01/01/16 1955 01/01/16 2031 01/02/16 0658  BP: 140/70  188/82 162/65  Pulse: 61  65 70  Temp: 98.5 F (36.9 C)  97.9 F (36.6 C) 98.3 F (36.8 C)  TempSrc: Oral  Oral Oral  Resp: 18  18 18   Height:  Weight:    105.6 kg (232 lb 12.9 oz)  SpO2: 95% 96% 100% 98%    Intake/Output Summary (Last 24 hours) at 01/02/16 0743 Last data filed at 01/02/16 0100  Gross per 24 hour  Intake      0 ml  Output   1300 ml  Net  -1300 ml    Exam:  General: No acute respiratory distress Lungs: Clear to auscultation bilaterally without wheezes or crackles Cardiovascular: Regular rate and rhythm without murmur gallop or rub normal S1 and S2 Abdomen: Nontender, nondistended, soft, bowel sounds positive, no rebound, no ascites, no appreciable mass Extremities: 2 pitting edema to  mid thigh bilaterally    Data Review   Micro Results No results found for this or any previous visit (from the past 240 hour(s)).  Radiology Reports Dg Chest 2 View  01/01/2016  CLINICAL DATA:  Bilateral leg swelling with increasing cough, congestion and wheezing for several days. History of congestive heart failure. EXAM: CHEST  2 VIEW COMPARISON:  12/28/2015 and 12/04/2015) FINDINGS: There is stable cardiomegaly and chronic vascular congestion. There is right hemidiaphragm eventration with minimal adjacent basilar atelectasis. No confluent airspace opacity, edema or significant pleural effusion identified. Asymmetric glenohumeral degenerative changes on the left appear chronic. IMPRESSION: Stable cardiomegaly and chronic vascular congestion. No acute chest findings. Electronically Signed   By: Richardean Sale M.D.   On: 01/01/2016 10:15   Dg Chest 2 View  12/28/2015  CLINICAL DATA:  Shortness of breath for 1 week. Lower extremity swelling. Acute on chronic diastolic congestive heart failure. Acute respiratory failure with hypoxia. EXAM: CHEST  2 VIEW COMPARISON:  12/04/2015 FINDINGS: Mild to moderate cardiomegaly remains stable. Probable hiatal hernia again noted. There has been interval resolution of opacity in the right perihilar region since previous study, consistent with resolving atelectasis or infiltrate. No new areas of pulmonary opacity are seen. No pneumothorax or pleural effusion visualized. Thoracic spine degenerative changes again noted. IMPRESSION: Resolution of right perihilar atelectasis or infiltrate since previous study. No acute findings. Stable cardiomegaly and probable hiatal hernia. Electronically Signed   By: Earle Gell M.D.   On: 12/28/2015 14:46   Dg Chest 2 View  12/04/2015  CLINICAL DATA:  Cough and congestion for 2 weeks. Shortness of breath. EXAM: CHEST  2 VIEW COMPARISON:  09/10/2015 FINDINGS: Chronic eventration along the anterior right hemidiaphragm. Mild enlargement  of cardiac silhouette appears unchanged. Retrocardiac opacity is compatible with a hiatal hernia. There are linear densities in the right lower lung. No focal airspace disease in the left lung. Degenerative changes in the left shoulder joint. IMPRESSION: New densities in the right lower chest. Findings could represent focus of infection and/or atelectasis. Followup PA and lateral chest X-ray is recommended in 3-4 weeks following trial of antibiotic therapy to ensure resolution and exclude underlying malignancy. Electronically Signed   By: Markus Daft M.D.   On: 12/04/2015 22:11     CBC  Recent Labs Lab 12/28/15 1424 01/01/16 1022 01/01/16 1246  WBC 7.6 8.0 7.1  HGB 10.6* 10.7* 10.3*  HCT 33.6* 34.1* 33.1*  PLT 268 285 256  MCV 78.1 78.6 78.8  MCH 24.7* 24.7* 24.5*  MCHC 31.5 31.4 31.1  RDW 16.9* 17.2* 17.3*  LYMPHSABS 2.0 2.4  --   MONOABS 0.5 0.7  --   EOSABS 0.5 0.5  --   BASOSABS 0.0 0.0  --     Chemistries   Recent Labs Lab 12/28/15 1424 01/01/16 1022 01/01/16 1246 01/02/16 0146  NA  144 140  --  142  K 4.0 3.7  --  3.4*  CL 103 104  --  102  CO2 28 24  --  29  GLUCOSE 99 126*  --  139*  BUN 35* 25*  --  26*  CREATININE 2.03* 1.85* 1.83* 1.90*  CALCIUM 9.8 9.5  --  9.1  MG  --   --  2.3  --   AST 17 15  --   --   ALT 9* 10*  --   --   ALKPHOS 105 110  --   --   BILITOT 0.2* 0.3  --   --    ------------------------------------------------------------------------------------------------------------------ estimated creatinine clearance is 28.3 mL/min (by C-G formula based on Cr of 1.9). ------------------------------------------------------------------------------------------------------------------  Recent Labs  01/01/16 1246  HGBA1C 8.0*   ------------------------------------------------------------------------------------------------------------------ No results for input(s): CHOL, HDL, LDLCALC, TRIG, CHOLHDL, LDLDIRECT in the last 72  hours. ------------------------------------------------------------------------------------------------------------------  Recent Labs  01/01/16 1246  TSH 2.213   ------------------------------------------------------------------------------------------------------------------ No results for input(s): VITAMINB12, FOLATE, FERRITIN, TIBC, IRON, RETICCTPCT in the last 72 hours.  Coagulation profile No results for input(s): INR, PROTIME in the last 168 hours.  No results for input(s): DDIMER in the last 72 hours.  Cardiac Enzymes  Recent Labs Lab 01/01/16 1246 01/01/16 1834 01/02/16 0146  TROPONINI <0.03 <0.03 <0.03   ------------------------------------------------------------------------------------------------------------------ Invalid input(s): POCBNP   CBG:  Recent Labs Lab 01/01/16 1658 01/01/16 2249  GLUCAP 201* 72       Studies: Dg Chest 2 View  01/01/2016  CLINICAL DATA:  Bilateral leg swelling with increasing cough, congestion and wheezing for several days. History of congestive heart failure. EXAM: CHEST  2 VIEW COMPARISON:  12/28/2015 and 12/04/2015) FINDINGS: There is stable cardiomegaly and chronic vascular congestion. There is right hemidiaphragm eventration with minimal adjacent basilar atelectasis. No confluent airspace opacity, edema or significant pleural effusion identified. Asymmetric glenohumeral degenerative changes on the left appear chronic. IMPRESSION: Stable cardiomegaly and chronic vascular congestion. No acute chest findings. Electronically Signed   By: Richardean Sale M.D.   On: 01/01/2016 10:15      Lab Results  Component Value Date   HGBA1C 8.0* 01/01/2016   HGBA1C 7.6* 12/05/2015   HGBA1C 6.9* 10/15/2015   Lab Results  Component Value Date   MICROALBUR 3.08* 05/14/2010   LDLCALC 62 03/14/2015   CREATININE 1.90* 01/02/2016       Scheduled Meds: . aspirin EC  81 mg Oral Daily  . budesonide  0.25 mg Nebulization BID  .  cilostazol  100 mg Oral Daily  . cycloSPORINE  1 drop Both Eyes BID  . DULoxetine  20 mg Oral Daily  . furosemide  40 mg Intravenous BID  . heparin  5,000 Units Subcutaneous 3 times per day  . hydrALAZINE  50 mg Oral BID  . insulin aspart  0-9 Units Subcutaneous TID WC  . insulin aspart protamine- aspart  20 Units Subcutaneous BID WC  . latanoprost  1 drop Both Eyes QHS  . lubiprostone  8 mcg Oral BID WC  . metoprolol succinate  25 mg Oral Daily  . pantoprazole  40 mg Oral Daily  . simvastatin  20 mg Oral Daily  . sucralfate  1 g Oral TID WC & HS  . timolol  1 drop Both Eyes BID   Continuous Infusions:   Principal Problem:   Acute on chronic diastolic congestive heart failure (HCC) Active Problems:   Diabetes mellitus type 2 in obese [facility])   Essential  hypertension, benign   Morbid obesity (HCC)   Chronic kidney disease (CKD), stage IV (severe) (HCC)   COPD (chronic obstructive pulmonary disease) (HCC)   Leg edema   Acute CHF (congestive heart failure) ([place])    Time spent: 45 minutes   [facility] Pager [phone]. If 7PM-7AM, please contact night-coverage at [email or web address], password [facility] 01/02/2016, 7:43 AM  LOS: 1 day

## 2016-01-03 ENCOUNTER — Other Ambulatory Visit: Payer: Self-pay | Admitting: Pharmacist

## 2016-01-03 ENCOUNTER — Ambulatory Visit: Payer: Medicare Other | Admitting: Internal Medicine

## 2016-01-03 DIAGNOSIS — E669 Obesity, unspecified: Secondary | ICD-10-CM

## 2016-01-03 DIAGNOSIS — I10 Essential (primary) hypertension: Secondary | ICD-10-CM

## 2016-01-03 DIAGNOSIS — R600 Localized edema: Secondary | ICD-10-CM | POA: Insufficient documentation

## 2016-01-03 DIAGNOSIS — E119 Type 2 diabetes mellitus without complications: Secondary | ICD-10-CM

## 2016-01-03 LAB — COMPREHENSIVE METABOLIC PANEL
ALBUMIN: 3.4 g/dL — AB (ref 3.5–5.0)
ALT: 8 U/L — ABNORMAL LOW (ref 14–54)
ANION GAP: 10 (ref 5–15)
AST: 19 U/L (ref 15–41)
Alkaline Phosphatase: 99 U/L (ref 38–126)
BUN: 20 mg/dL (ref 6–20)
CO2: 29 mmol/L (ref 22–32)
Calcium: 8.9 mg/dL (ref 8.9–10.3)
Chloride: 102 mmol/L (ref 101–111)
Creatinine, Ser: 1.87 mg/dL — ABNORMAL HIGH (ref 0.44–1.00)
GFR calc Af Amer: 29 mL/min — ABNORMAL LOW (ref >60)
GFR calc non Af Amer: 25 mL/min — ABNORMAL LOW (ref >60)
GLUCOSE: 81 mg/dL (ref 65–99)
POTASSIUM: 3.9 mmol/L (ref 3.5–5.1)
SODIUM: 141 mmol/L (ref 135–145)
Total Bilirubin: 0.5 mg/dL (ref 0.3–1.2)
Total Protein: 6.9 g/dL (ref 6.5–8.1)

## 2016-01-03 LAB — CBC
HEMATOCRIT: 33.5 % — AB (ref 36.0–46.0)
HEMOGLOBIN: 10.4 g/dL — AB (ref 12.0–15.0)
MCH: 24.3 pg — AB (ref 26.0–34.0)
MCHC: 31 g/dL (ref 30.0–36.0)
MCV: 78.3 fL (ref 78.0–100.0)
Platelets: 278 10*3/uL (ref 150–400)
RBC: 4.28 MIL/uL (ref 3.87–5.11)
RDW: 17.4 % — ABNORMAL HIGH (ref 11.5–15.5)
WBC: 6.8 10*3/uL (ref 4.0–10.5)

## 2016-01-03 LAB — GLUCOSE, CAPILLARY
GLUCOSE-CAPILLARY: 123 mg/dL — AB (ref 65–99)
Glucose-Capillary: 108 mg/dL — ABNORMAL HIGH (ref 65–99)

## 2016-01-03 MED ORDER — POLYETHYLENE GLYCOL 3350 17 G PO PACK: 17.0000 g | PACK | Freq: Every day | ORAL | Status: DC

## 2016-01-03 MED ORDER — FUROSEMIDE 10 MG/ML IJ SOLN
80.0000 mg | Freq: Once | INTRAMUSCULAR | Status: AC
  Administered 2016-01-03: 80 mg via INTRAVENOUS
  Filled 2016-01-03: qty 8

## 2016-01-03 NOTE — Progress Notes (Signed)
I agree with the assessment given by the previous RN.

## 2016-01-03 NOTE — Discharge Summary (Signed)
Physician Discharge Summary  Michelle Buck MRN: [id number] DOB/AGE: [birth year] 80 y.o.  PCP: [name], NP   Admit date: 01/01/2016 Discharge date: 01/03/2016  Discharge Diagnoses:     Principal Problem:   Acute on chronic diastolic congestive heart failure (HCC) Active Problems:   Diabetes mellitus type 2 in obese [facility])   Essential hypertension, benign   Morbid obesity ([place])   Chronic kidney disease (CKD), stage IV (severe) (HCC)   COPD (chronic obstructive pulmonary disease) ([place])   Leg edema   Acute CHF (congestive heart failure) (HCC)   Bilateral edema of lower extremity    Follow-up recommendations Follow-up with PCP in 3-5 days , including all  additional recommended appointments as below Follow-up CBC, CMP in 3-5 days      Medication List    STOP taking these medications        cloNIDine 0.1 MG tablet  Commonly known as:  CATAPRES     levofloxacin 750 MG tablet  Commonly known as:  LEVAQUIN      TAKE these medications        AMITIZA 24 MCG capsule  Generic drug:  lubiprostone  TAKE 1 CAPSULE BY MOUTH 2 TIMES A DAY WITH MEALS     aspirin EC 81 MG tablet  Take 81 mg by mouth daily.     budesonide 0.25 MG/2ML nebulizer solution  Commonly known as:  PULMICORT  Take 2 mLs (0.25 mg total) by nebulization 2 (two) times daily.     cilostazol 100 MG tablet  Commonly known as:  PLETAL  TAKE 1 TABLET BY MOUTH EVERY DAY     cycloSPORINE 0.05 % ophthalmic emulsion  Commonly known as:  RESTASIS  Place 1 drop into both eyes 2 (two) times daily. One drop once a day bilaterally for dry eyes     DULoxetine 30 MG capsule  Commonly known as:  CYMBALTA  TAKE 1 CAPSULE BY MOUTH EVERY DAY TO HELP ANXIETY AND PAINS     EASY TOUCH INSULIN SYRINGE 31G X 5/16" 0.5 ML Misc  Generic drug:  Insulin Syringe-Needle U-100  USE UP TO 2 TIMES A DAY AS DIRECTED     furosemide 40 MG tablet  Commonly known as:  LASIX  Take 1 tablet (40 mg total) by mouth 2 (two) times  daily.     gabapentin 100 MG capsule  Commonly known as:  NEURONTIN  TAKE 2 CAPSULES BY MOUTH THREE TIMES A DAY     guaifenesin 100 MG/5ML syrup  Commonly known as:  ROBITUSSIN  Take 200 mg by mouth 3 (three) times daily as needed for cough.     HUMULIN 70/30 (70-30) 100 UNIT/ML injection  Generic drug:  insulin NPH-regular Human  Inject 40 units subcutaneously with breakfast and 20 units with dinner to control blood sugar.     hydrALAZINE 50 MG tablet  Commonly known as:  APRESOLINE  TAKE 1 TABLET BY MOUTH TWICE DAILY     HYDROcodone-acetaminophen 10-325 MG tablet  Commonly known as:  NORCO  Take 1 tablet by mouth every 6 (six) hours as needed for moderate pain.     ipratropium-albuterol 0.5-2.5 (3) MG/3ML Soln  Commonly known as:  DUONEB  Take 3 mLs by nebulization every 4 (four) hours as needed.     magnesium oxide 400 MG tablet  Commonly known as:  MAG-OX  Take 1 tablet (400 mg total) by mouth 2 (two) times daily.     metoprolol succinate 25 MG 24 hr tablet  Commonly known as:  TOPROL-XL  TAKE 1 TABLET BY MOUTH EVERY DAY WITH OR IMMEDIATELY FOLLOWING A MEAL FOR BLOOD PRESSURE     NEXIUM 40 MG capsule  Generic drug:  esomeprazole  TAKE 1 CAPSULE BY MOUTH ONCE DAILY     polyethylene glycol packet  Commonly known as:  MIRALAX / GLYCOLAX  Take 17 g by mouth daily.     potassium chloride SA 20 MEQ tablet  Commonly known as:  K-DUR,KLOR-CON  TAKE 1 TABLET BY MOUTH EVERY DAY     sertraline 50 MG tablet  Commonly known as:  ZOLOFT  TAKE 1 TABLET BY MOUTH EVERY DAY     simvastatin 20 MG tablet  Commonly known as:  ZOCOR  TAKE 1 TABLET BY MOUTH EVERY DAY     sucralfate 1 g tablet  Commonly known as:  CARAFATE  TAKE 1 TABLET BY MOUTH TWICE DAILY ON EMPTY STOMACH     timolol 0.5 % ophthalmic solution  Commonly known as:  TIMOPTIC  Place 1 drop into both eyes 2 (two) times daily.     Travoprost (BAK Free) 0.004 % Soln ophthalmic solution  Commonly known as:   TRAVATAN  Place 1 drop into both eyes at bedtime. One drop bilaterally twice a day     VENTOLIN HFA 108 (90 Base) MCG/ACT inhaler  Generic drug:  albuterol  INHALE 1 PUFF 4 TIMES A DAY FOR 5 DAYS, THEN USE EVERY 6 HOURS AS NEEDED     VOLTAREN 1 % Gel  Generic drug:  diclofenac sodium  APPLY 4 GRAM TOPICALLY TWICE DAILY         Discharge Condition: Stable   Discharge Instructions       Discharge Instructions    Amb Referral to HF Clinic    Complete by:  As directed      Diet - low sodium heart healthy    Complete by:  As directed      Diet - low sodium heart healthy    Complete by:  As directed      Increase activity slowly    Complete by:  As directed      Increase activity slowly    Complete by:  As directed                Disposition: 01-Home or Self Care   Consults: * None  Significant Diagnostic Studies:  Dg Chest 2 View  01/01/2016  CLINICAL DATA:  Bilateral leg swelling with increasing cough, congestion and wheezing for several days. History of congestive heart failure. EXAM: CHEST  2 VIEW COMPARISON:  12/28/2015 and 12/04/2015) FINDINGS: There is stable cardiomegaly and chronic vascular congestion. There is right hemidiaphragm eventration with minimal adjacent basilar atelectasis. No confluent airspace opacity, edema or significant pleural effusion identified. Asymmetric glenohumeral degenerative changes on the left appear chronic. IMPRESSION: Stable cardiomegaly and chronic vascular congestion. No acute chest findings. Electronically Signed   By: Richardean Sale M.D.   On: 01/01/2016 10:15   Dg Chest 2 View  12/28/2015  CLINICAL DATA:  Shortness of breath for 1 week. Lower extremity swelling. Acute on chronic diastolic congestive heart failure. Acute respiratory failure with hypoxia. EXAM: CHEST  2 VIEW COMPARISON:  12/04/2015 FINDINGS: Mild to moderate cardiomegaly remains stable. Probable hiatal hernia again noted. There has been interval resolution of  opacity in the right perihilar region since previous study, consistent with resolving atelectasis or infiltrate. No new areas of pulmonary opacity are seen. No pneumothorax or pleural effusion  visualized. Thoracic spine degenerative changes again noted. IMPRESSION: Resolution of right perihilar atelectasis or infiltrate since previous study. No acute findings. Stable cardiomegaly and probable hiatal hernia. Electronically Signed   By: Earle Gell M.D.   On: 12/28/2015 14:46   Dg Chest 2 View  12/04/2015  CLINICAL DATA:  Cough and congestion for 2 weeks. Shortness of breath. EXAM: CHEST  2 VIEW COMPARISON:  09/10/2015 FINDINGS: Chronic eventration along the anterior right hemidiaphragm. Mild enlargement of cardiac silhouette appears unchanged. Retrocardiac opacity is compatible with a hiatal hernia. There are linear densities in the right lower lung. No focal airspace disease in the left lung. Degenerative changes in the left shoulder joint. IMPRESSION: New densities in the right lower chest. Findings could represent focus of infection and/or atelectasis. Followup PA and lateral chest X-ray is recommended in 3-4 weeks following trial of antibiotic therapy to ensure resolution and exclude underlying malignancy. Electronically Signed   By: Markus Daft M.D.   On: 12/04/2015 22:11     2-D echo LV EF: 65% -  70%  ------------------------------------------------------------------- Indications:   Chest pain 786.51.  ------------------------------------------------------------------- History:  PMH: CKD. Congestive heart failure. Chronic obstructive pulmonary disease. Risk factors: Hypertension. Diabetes mellitus.  ------------------------------------------------------------------- Study Conclusions  - Procedure narrative: Transthoracic echocardiography. Image quality was suboptimal. The study was technically difficult, as a result of poor acoustic windows, poor sound wave  transmission, and body habitus. - Left ventricle: The cavity size was normal. Wall thickness was increased in a pattern of mild LVH. Systolic function was vigorous. The estimated ejection fraction was in the range of 65% to 70%. Wall motion was normal; there were no regional wall motion abnormalities. Doppler parameters are consistent with abnormal left ventricular relaxation (grade 1 diastolic dysfunction). - Mitral valve: There was mild regurgitation.  Impressions:  - Compared to the prior study, there has been no significant interval change.    Filed Weights   01/01/16 1336 01/02/16 0658 01/03/16 0601  Weight: 106.3 kg (234 lb 5.6 oz) 105.6 kg (232 lb 12.9 oz) 103.284 kg (227 lb 11.2 oz)     Microbiology: No results found for this or any previous visit (from the past 240 hour(s)).     Blood Culture    Component Value Date/Time   SDES URINE, CLEAN CATCH 12/05/2015 0414   SPECREQUEST NONE 12/05/2015 0414   CULT  12/05/2015 0251    NO GROWTH 5 DAYS Performed at [facility] 12/06/2015 FINAL 12/05/2015 0414      Labs: Results for orders placed or performed during the hospital encounter of 01/01/16 (from the past 48 hour(s))  CBC with Differential/Platelet     Status: Abnormal   Collection Time: 01/01/16 10:22 AM  Result Value Ref Range   WBC 8.0 4.0 - 10.5 K/uL   RBC 4.34 3.87 - 5.11 MIL/uL   Hemoglobin 10.7 (L) 12.0 - 15.0 g/dL   HCT 34.1 (L) 36.0 - 46.0 %   MCV 78.6 78.0 - 100.0 fL   MCH 24.7 (L) 26.0 - 34.0 pg   MCHC 31.4 30.0 - 36.0 g/dL   RDW 17.2 (H) 11.5 - 15.5 %   Platelets 285 150 - 400 K/uL   Neutrophils Relative % 55 %   Neutro Abs 4.4 1.7 - 7.7 K/uL   Lymphocytes Relative 30 %   Lymphs Abs 2.4 0.7 - 4.0 K/uL   Monocytes Relative 9 %   Monocytes Absolute 0.7 0.1 - 1.0 K/uL   Eosinophils Relative 6 %  Eosinophils Absolute 0.5 0.0 - 0.7 K/uL   Basophils Relative 0 %   Basophils Absolute 0.0 0.0 - 0.1 K/uL   Comprehensive metabolic panel     Status: Abnormal   Collection Time: 01/01/16 10:22 AM  Result Value Ref Range   Sodium 140 135 - 145 mmol/L   Potassium 3.7 3.5 - 5.1 mmol/L   Chloride 104 101 - 111 mmol/L   CO2 24 22 - 32 mmol/L   Glucose, Bld 126 (H) 65 - 99 mg/dL   BUN 25 (H) 6 - 20 mg/dL   Creatinine, Ser 1.85 (H) 0.44 - 1.00 mg/dL   Calcium 9.5 8.9 - 10.3 mg/dL   Total Protein 7.3 6.5 - 8.1 g/dL   Albumin 3.7 3.5 - 5.0 g/dL   AST 15 15 - 41 U/L   ALT 10 (L) 14 - 54 U/L   Alkaline Phosphatase 110 38 - 126 U/L   Total Bilirubin 0.3 0.3 - 1.2 mg/dL   GFR calc non Af Amer 25 (L) >60 mL/min   GFR calc Af Amer 29 (L) >60 mL/min    Comment: (NOTE) The eGFR has been calculated using the CKD EPI equation. This calculation has not been validated in all clinical situations. eGFR's persistently <60 mL/min signify possible Chronic Kidney Disease.    Anion gap 12 5 - 15  Brain natriuretic peptide     Status: None   Collection Time: 01/01/16 10:23 AM  Result Value Ref Range   B Natriuretic Peptide 41.6 0.0 - 100.0 pg/mL  I-stat troponin, ED     Status: None   Collection Time: 01/01/16 10:36 AM  Result Value Ref Range   Troponin i, poc 0.02 0.00 - 0.08 ng/mL   Comment 3            Comment: Due to the release kinetics of cTnI, a negative result within the first hours of the onset of symptoms does not rule out myocardial infarction with certainty. If myocardial infarction is still suspected, repeat the test at appropriate intervals.   Hemoglobin A1c     Status: Abnormal   Collection Time: 01/01/16 12:46 PM  Result Value Ref Range   Hgb A1c MFr Bld 8.0 (H) 4.8 - 5.6 %    Comment: (NOTE)         Pre-diabetes: 5.7 - 6.4         Diabetes: >6.4         Glycemic control for adults with diabetes: <7.0    Mean Plasma Glucose 183 mg/dL    Comment: (NOTE) Performed At: [facility] [place], [place] [id number] Lindon Romp MD [id number]   Magnesium      Status: None   Collection Time: 01/01/16 12:46 PM  Result Value Ref Range   Magnesium 2.3 1.7 - 2.4 mg/dL  TSH     Status: None   Collection Time: 01/01/16 12:46 PM  Result Value Ref Range   TSH 2.213 0.350 - 4.500 uIU/mL  Troponin I     Status: None   Collection Time: 01/01/16 12:46 PM  Result Value Ref Range   Troponin I <0.03 <0.031 ng/mL    Comment:        NO INDICATION OF MYOCARDIAL INJURY.   CBC     Status: Abnormal   Collection Time: 01/01/16 12:46 PM  Result Value Ref Range   WBC 7.1 4.0 - 10.5 K/uL   RBC 4.20 3.87 - 5.11 MIL/uL   Hemoglobin 10.3 (L) 12.0 -  15.0 g/dL   HCT 33.1 (L) 36.0 - 46.0 %   MCV 78.8 78.0 - 100.0 fL   MCH 24.5 (L) 26.0 - 34.0 pg   MCHC 31.1 30.0 - 36.0 g/dL   RDW 17.3 (H) 11.5 - 15.5 %   Platelets 256 150 - 400 K/uL  Creatinine, serum     Status: Abnormal   Collection Time: 01/01/16 12:46 PM  Result Value Ref Range   Creatinine, Ser 1.83 (H) 0.44 - 1.00 mg/dL   GFR calc non Af Amer 25 (L) >60 mL/min   GFR calc Af Amer 30 (L) >60 mL/min    Comment: (NOTE) The eGFR has been calculated using the CKD EPI equation. This calculation has not been validated in all clinical situations. eGFR's persistently <60 mL/min signify possible Chronic Kidney Disease.   Glucose, capillary     Status: Abnormal   Collection Time: 01/01/16  4:58 PM  Result Value Ref Range   Glucose-Capillary 201 (H) 65 - 99 mg/dL  Troponin I     Status: None   Collection Time: 01/01/16  6:34 PM  Result Value Ref Range   Troponin I <0.03 <0.031 ng/mL    Comment:        NO INDICATION OF MYOCARDIAL INJURY.   Glucose, capillary     Status: None   Collection Time: 01/01/16 10:49 PM  Result Value Ref Range   Glucose-Capillary 72 65 - 99 mg/dL  Troponin I     Status: None   Collection Time: 01/02/16  1:46 AM  Result Value Ref Range   Troponin I <0.03 <0.031 ng/mL    Comment:        NO INDICATION OF MYOCARDIAL INJURY.   Basic metabolic panel     Status: Abnormal    Collection Time: 01/02/16  1:46 AM  Result Value Ref Range   Sodium 142 135 - 145 mmol/L   Potassium 3.4 (L) 3.5 - 5.1 mmol/L   Chloride 102 101 - 111 mmol/L   CO2 29 22 - 32 mmol/L   Glucose, Bld 139 (H) 65 - 99 mg/dL   BUN 26 (H) 6 - 20 mg/dL   Creatinine, Ser 1.90 (H) 0.44 - 1.00 mg/dL   Calcium 9.1 8.9 - 10.3 mg/dL   GFR calc non Af Amer 24 (L) >60 mL/min   GFR calc Af Amer 28 (L) >60 mL/min    Comment: (NOTE) The eGFR has been calculated using the CKD EPI equation. This calculation has not been validated in all clinical situations. eGFR's persistently <60 mL/min signify possible Chronic Kidney Disease.    Anion gap 11 5 - 15  Glucose, capillary     Status: Abnormal   Collection Time: 01/02/16  7:34 AM  Result Value Ref Range   Glucose-Capillary 126 (H) 65 - 99 mg/dL  Glucose, capillary     Status: None   Collection Time: 01/02/16 11:48 AM  Result Value Ref Range   Glucose-Capillary 85 65 - 99 mg/dL  Glucose, capillary     Status: Abnormal   Collection Time: 01/02/16  4:39 PM  Result Value Ref Range   Glucose-Capillary 120 (H) 65 - 99 mg/dL  Glucose, capillary     Status: Abnormal   Collection Time: 01/02/16  9:13 PM  Result Value Ref Range   Glucose-Capillary 113 (H) 65 - 99 mg/dL  Comprehensive metabolic panel     Status: Abnormal   Collection Time: 01/03/16  5:11 AM  Result Value Ref Range   Sodium 141 135 -  145 mmol/L   Potassium 3.9 3.5 - 5.1 mmol/L   Chloride 102 101 - 111 mmol/L   CO2 29 22 - 32 mmol/L   Glucose, Bld 81 65 - 99 mg/dL   BUN 20 6 - 20 mg/dL   Creatinine, Ser 1.87 (H) 0.44 - 1.00 mg/dL   Calcium 8.9 8.9 - 10.3 mg/dL   Total Protein 6.9 6.5 - 8.1 g/dL   Albumin 3.4 (L) 3.5 - 5.0 g/dL   AST 19 15 - 41 U/L   ALT 8 (L) 14 - 54 U/L   Alkaline Phosphatase 99 38 - 126 U/L   Total Bilirubin 0.5 0.3 - 1.2 mg/dL   GFR calc non Af Amer 25 (L) >60 mL/min   GFR calc Af Amer 29 (L) >60 mL/min    Comment: (NOTE) The eGFR has been calculated using the  CKD EPI equation. This calculation has not been validated in all clinical situations. eGFR's persistently <60 mL/min signify possible Chronic Kidney Disease.    Anion gap 10 5 - 15  CBC     Status: Abnormal   Collection Time: 01/03/16  5:11 AM  Result Value Ref Range   WBC 6.8 4.0 - 10.5 K/uL   RBC 4.28 3.87 - 5.11 MIL/uL   Hemoglobin 10.4 (L) 12.0 - 15.0 g/dL   HCT 33.5 (L) 36.0 - 46.0 %   MCV 78.3 78.0 - 100.0 fL   MCH 24.3 (L) 26.0 - 34.0 pg   MCHC 31.0 30.0 - 36.0 g/dL   RDW 17.4 (H) 11.5 - 15.5 %   Platelets 278 150 - 400 K/uL  Glucose, capillary     Status: Abnormal   Collection Time: 01/03/16  7:39 AM  Result Value Ref Range   Glucose-Capillary 108 (H) 65 - 99 mg/dL     Lipid Panel     Component Value Date/Time   CHOL 147 03/14/2015 0806   CHOL 144 05/14/2010 2347   TRIG 219* 03/14/2015 0806   HDL 41 03/14/2015 0806   HDL 48 05/14/2010 2347   CHOLHDL 3.6 03/14/2015 0806   CHOLHDL 3.0 Ratio 05/14/2010 2347   VLDL 40 05/14/2010 2347   LDLCALC 62 03/14/2015 0806   LDLCALC 56 05/14/2010 2347     Lab Results  Component Value Date   HGBA1C 8.0* 01/01/2016   HGBA1C 7.6* 12/05/2015   HGBA1C 6.9* 10/15/2015     Lab Results  Component Value Date   MICROALBUR 3.08* 05/14/2010   LDLCALC 62 03/14/2015   CREATININE 1.87* 01/03/2016     80 year old female with a history of diabetes mellitus, hypertension, diastolic heart failure , with most recent echo on 08/13/15 with an EF of 60-65%, stress test on 12/19/15 with an EF of 69%, normal perfusion imaging, recently seen by Dr. Skeet Latch, who presents to the ER with worsening shortness of breath and bilateral lower extremity edema that has slowly been getting worse over the last 2-3 weeks. Symptoms are associated with a chronic productive cough. Patient has a rattle and a postnasal drip for the last 3 weeks. She she states that she's had these symptoms more or less since her recent admission for HCAP between 1/4-1/8,  patient was in the ER on 1/28 with complaints of worsening bilateral lower extremity edema was given IV Lasix, advised to continue with Lasix and compression stockings, sodium restriction and discharged home, she returns today with worsening bilateral lower extremity edema despite being on 40 mg of Lasix BID, renal function is slightly better than her  baseline of 2.0. ProBNP 41.6. Venous Doppler negative for DVT.   Assessment and plan  1. Acute on chronic diastolic CHF with bilateral lower extremity edema EF 33-12% grade II diastolic dysfunction documented on 01/01/16 , Chest x-ray shows cardiomegaly with chronic pulmonary vascular congestion, no pneumonia Patient diuresed with Lasix 60 mg IV BID, improved edema and improved shortness of breath on the day of discharge - Continue metoprolol, ASA, statin,  TSH 2.2 Venous lower extremity Doppler to rule out DVT, negative  Continue Lasix 40 mg po bid  2. COPD without exacerbation  - Not requiring supplemental O2 at home  95% on room air - Conitnue home regimen of DuoNebs prn, Symbicort    3. Insulin-dependent DM with neuropathy Placed on insulin 70/30, 20 units twice a day with sliding scale insulin  hemoglobin A1c 8.0 Resume home dose of insulin  5. ASCVD  Cardiac enzymes negative times 3, patient recently had a stress test that was negative - Continue ASA 81, statin, metoprolol  - Will continue home-dose cilostazol for PAD   6. Hypokalemia   mag 2.3 Repleted Follow in the outpatient setting     Discharge Exam:    Blood pressure 139/54, pulse 74, temperature 98.5 F (36.9 C), temperature source Oral, resp. rate 20, height _0  (1.575 m), weight 103.284 kg (227 lb 11.2 oz), SpO2 94 %.  General: No acute respiratory distress Lungs: Clear to auscultation bilaterally without wheezes or crackles Cardiovascular: Regular rate and rhythm without murmur gallop or rub normal S1 and S2 Abdomen: Nontender,  nondistended, soft, bowel sounds positive, no rebound, no ascites, no appreciable mass Extremities: 2 pitting edema to mid thigh bilaterally    Follow-up Information    Follow up with Dewaine Oats, JESSICA K, NP. Schedule an appointment as soon as possible for a visit in 1 week.   Specialty:  Geriatric Medicine   Contact information:   [place] [place] [address] [phone]       Signed: Reyne Dumas 01/03/2016, 10:06 AM        Time spent >45 mins

## 2016-01-03 NOTE — Patient Outreach (Signed)
[facility]) Care Management  01/03/2016  Michelle Buck 1936-03-16 [id number]   Michelle Buck is a 80yo who was referred to [facility] for medication review.  Per referral, "Patient states she is on a lot of medications and she doesn't know what all of the mediations are for."  I planned to reach out to patient today to review her medications.  Chart reviewed and noted that patient is currently inpatient at [facility].  Will plan outreach call to patient once she is discharged from the hospital.     [name], Pharm.D. Pharmacy Resident [place] [phone]

## 2016-01-05 DIAGNOSIS — I129 Hypertensive chronic kidney disease with stage 1 through stage 4 chronic kidney disease, or unspecified chronic kidney disease: Secondary | ICD-10-CM | POA: Diagnosis not present

## 2016-01-05 DIAGNOSIS — I2510 Atherosclerotic heart disease of native coronary artery without angina pectoris: Secondary | ICD-10-CM | POA: Diagnosis not present

## 2016-01-05 DIAGNOSIS — E785 Hyperlipidemia, unspecified: Secondary | ICD-10-CM | POA: Diagnosis not present

## 2016-01-05 DIAGNOSIS — E1142 Type 2 diabetes mellitus with diabetic polyneuropathy: Secondary | ICD-10-CM | POA: Diagnosis not present

## 2016-01-05 DIAGNOSIS — F419 Anxiety disorder, unspecified: Secondary | ICD-10-CM | POA: Diagnosis not present

## 2016-01-05 DIAGNOSIS — N184 Chronic kidney disease, stage 4 (severe): Secondary | ICD-10-CM | POA: Diagnosis not present

## 2016-01-05 DIAGNOSIS — M170 Bilateral primary osteoarthritis of knee: Secondary | ICD-10-CM | POA: Diagnosis not present

## 2016-01-05 DIAGNOSIS — Z87891 Personal history of nicotine dependence: Secondary | ICD-10-CM | POA: Diagnosis not present

## 2016-01-05 DIAGNOSIS — Z7982 Long term (current) use of aspirin: Secondary | ICD-10-CM | POA: Diagnosis not present

## 2016-01-05 DIAGNOSIS — I5033 Acute on chronic diastolic (congestive) heart failure: Secondary | ICD-10-CM | POA: Diagnosis not present

## 2016-01-05 DIAGNOSIS — J449 Chronic obstructive pulmonary disease, unspecified: Secondary | ICD-10-CM | POA: Diagnosis not present

## 2016-01-05 DIAGNOSIS — K219 Gastro-esophageal reflux disease without esophagitis: Secondary | ICD-10-CM | POA: Diagnosis not present

## 2016-01-05 DIAGNOSIS — E1122 Type 2 diabetes mellitus with diabetic chronic kidney disease: Secondary | ICD-10-CM | POA: Diagnosis not present

## 2016-01-06 ENCOUNTER — Other Ambulatory Visit: Payer: Self-pay | Admitting: *Deleted

## 2016-01-06 ENCOUNTER — Other Ambulatory Visit: Payer: Self-pay | Admitting: Pharmacist

## 2016-01-06 NOTE — Patient Outreach (Signed)
[facility]) Care Management  01/06/2016  Nadelyn Chrisp [birth year] [id number]   Rilynne Michener is a 80yo who was referred to [facility] for medication review/education.  I made initial outreach call to patient today to review her medications.  Patient reports her sister manages her medications and fills a weekly pill box for patient every Sunday.  Patient requests a home visit to review medications.  Initial home visit scheduled for Wednesday, 01/08/16.     [name], Pharm.D. Pharmacy Resident [place] [phone]

## 2016-01-06 NOTE — Patient Outreach (Signed)
[facility]) Care Management  01/06/2016  Zelma Zahler [birth year] [id number]  Transition of care (week 1)  RN attempted to reach pt today unsuccessful due to a recent hospital admission on 2/3. RN will continue to follow up however RN has a schedule initial home visit on Wednesday 2/8. Will follow up accordingly.  [name], RN Care Management Coordinator [place] Main Office [phone]

## 2016-01-07 ENCOUNTER — Encounter: Payer: Self-pay | Admitting: Nurse Practitioner

## 2016-01-07 ENCOUNTER — Ambulatory Visit (INDEPENDENT_AMBULATORY_CARE_PROVIDER_SITE_OTHER): Payer: Medicare Other | Admitting: Nurse Practitioner

## 2016-01-07 VITALS — BP 142/88 | HR 68 | Temp 98.0°F | Resp 20 | Ht 62.0 in | Wt 232.8 lb

## 2016-01-07 DIAGNOSIS — Z794 Long term (current) use of insulin: Secondary | ICD-10-CM | POA: Diagnosis not present

## 2016-01-07 DIAGNOSIS — N184 Chronic kidney disease, stage 4 (severe): Secondary | ICD-10-CM | POA: Diagnosis not present

## 2016-01-07 DIAGNOSIS — J410 Simple chronic bronchitis: Secondary | ICD-10-CM

## 2016-01-07 DIAGNOSIS — E1121 Type 2 diabetes mellitus with diabetic nephropathy: Secondary | ICD-10-CM | POA: Diagnosis not present

## 2016-01-07 DIAGNOSIS — I2510 Atherosclerotic heart disease of native coronary artery without angina pectoris: Secondary | ICD-10-CM | POA: Diagnosis not present

## 2016-01-07 DIAGNOSIS — I5033 Acute on chronic diastolic (congestive) heart failure: Secondary | ICD-10-CM | POA: Diagnosis not present

## 2016-01-07 DIAGNOSIS — J449 Chronic obstructive pulmonary disease, unspecified: Secondary | ICD-10-CM | POA: Diagnosis not present

## 2016-01-07 DIAGNOSIS — R600 Localized edema: Secondary | ICD-10-CM | POA: Diagnosis not present

## 2016-01-07 DIAGNOSIS — M1990 Unspecified osteoarthritis, unspecified site: Secondary | ICD-10-CM

## 2016-01-07 DIAGNOSIS — E1122 Type 2 diabetes mellitus with diabetic chronic kidney disease: Secondary | ICD-10-CM | POA: Diagnosis not present

## 2016-01-07 DIAGNOSIS — I129 Hypertensive chronic kidney disease with stage 1 through stage 4 chronic kidney disease, or unspecified chronic kidney disease: Secondary | ICD-10-CM | POA: Diagnosis not present

## 2016-01-07 MED ORDER — DICLOFENAC SODIUM 1 % TD GEL: TRANSDERMAL | Status: DC

## 2016-01-07 NOTE — Progress Notes (Signed)
Patient ID: Michelle Buck, female   DOB: [birth year], 80 y.o.   MRN: [id number]    PCP: [name], NP  Advanced Directive information Does patient have an advance directive?: Yes, Type of Advance Directive: [place], Does patient want to make changes to advanced directive?: No - Patient declined  Allergies  Allergen Reactions  . Tramadol Other (See Comments)    Leg cramps   . Codeine Nausea And Vomiting    Patient states N/V with codeine  . Hydrocodone Nausea And Vomiting  . Oxycodone Other (See Comments)    Patient states she can tolerate oxycodone  . Tylenol [Acetaminophen] Hives  . Penicillins Rash    Patient states rash/itch with penicillin Has patient had a PCN reaction causing immediate rash, facial/tongue/throat swelling, SOB or lightheadedness with hypotension: Yes- broke me out and i was itching Has patient had a PCN reaction causing severe rash involving mucus membranes or skin necrosis: No Has patient had a PCN reaction that required hospitalization Yes- i was already in the hospital Has patient had a PCN reaction occurring within the last 10 years: No If all of the above answers are "NO    Chief Complaint  Patient presents with  . Follow-up    Hospital follow -up B/P readings in chart form home      HPI: Patient is a 80 y.o. female seen in the office today for hospital follow up and weight gain. Pt with history of diabetes mellitus, hypertension, diastolic heart failure , stress test on 12/19/15 with an EF of 69%, normal perfusion imaging, pt recently hospitalized due acute CHF, presented to hospital due to to worsening shortness of breath and bilateral lower extremity edema that had slowly been getting worse over the last 2-3 weeks.Patient diuresed with Lasix 60 mg IV BID, improved edema and improved shortness of breath on the day of discharge. Pt was discharged on metoprolol, ASA, statin and lasix 40 mg twice daily.  Home health nursing  following pt and reported a 2 lb weight gain from yesterday to today.  Reports breathing is okay but she conts to wheeze. Does not know what nebulizers she is on. No worsening shortness of breath but has some shortness of breath Reports legs are swollen some but not has swollen as they have been.   Blood sugars ranging from 109-220 fasting   Unsure of medications she is actually taken Review of Systems:  Review of Systems  Constitutional: Negative for fever, chills, diaphoresis, appetite change and unexpected weight change.  HENT: Negative for congestion, ear discharge and ear pain.   Eyes: Negative for visual disturbance.  Respiratory: Positive for cough and shortness of breath (baseline, worse with exertion). Negative for wheezing.   Cardiovascular: Positive for leg swelling. Negative for chest pain and palpitations.  Gastrointestinal: Negative for constipation.  Genitourinary: Negative for dysuria.  Musculoskeletal: Positive for back pain, arthralgias and neck pain.  Skin: Negative for color change, rash and wound.  Neurological: Positive for numbness (neuropathy to hands and legs). Negative for dizziness, facial asymmetry, light-headedness and headaches.  Hematological: Negative for adenopathy.  Psychiatric/Behavioral: Negative for behavioral problems, dysphoric mood and agitation.    Past Medical History  Diagnosis Date  . Diabetes mellitus   . Hypertension   . Obese   . Hiatal hernia   . Scoliosis   . Arthritis   . Acute bronchitis   . COPD (chronic obstructive pulmonary disease) ([place])   . Unspecified hereditary and idiopathic peripheral neuropathy   .  Secondary diabetes mellitus with renal manifestations, not stated as uncontrolled, or unspecified   . Chronic kidney disease, stage II (mild)   . Chronic kidney disease, stage II (mild)   . Chronic kidney disease, unspecified ([place])   . Disorder of bone and cartilage, unspecified   . Complications affecting other specified  body systems, hypertension   . Hypopotassemia   . Anxiety   . Congestive heart failure, unspecified   . GERD (gastroesophageal reflux disease)   . Lumbago   . Insomnia, unspecified   . Hyperlipidemia   . Unspecified glaucoma   . Diaphragmatic hernia without mention of obstruction or gangrene   . Osteoarthrosis, unspecified whether generalized or localized, unspecified site   . Degenerative arthritis of knee, bilateral   . PONV (postoperative nausea and vomiting)    Past Surgical History  Procedure Laterality Date  . Abdominal hysterectomy    . Shoulder surgery    . Total hip arthroplasty      bilateral  . Knee arthroscopy      bilateral  . Colonoscopy  08/06/2011  . Foot surgery     Social History:   reports that she quit smoking about 18 years ago. Her smoking use included Cigarettes. She has never used smokeless tobacco. She reports that she does not drink alcohol or use illicit drugs.  Family History  Problem Relation Age of Onset  . Diabetes Mother   . Heart disease Mother   . Heart disease Brother   . Diabetes Brother   . Kidney disease Father   . Scoliosis Sister   . Diabetes Brother   . Diabetes Brother   . Heart disease Brother   . Scoliosis Brother   . Stroke Brother   . Heart attack Brother     Medications: Patient's Medications  New Prescriptions   No medications on file  Previous Medications   AMITIZA 24 MCG CAPSULE    TAKE 1 CAPSULE BY MOUTH 2 TIMES A DAY WITH MEALS   ASPIRIN EC 81 MG TABLET    Take 81 mg by mouth daily.   BUDESONIDE (PULMICORT) 0.25 MG/2ML NEBULIZER SOLUTION    Take 2 mLs (0.25 mg total) by nebulization 2 (two) times daily.   CILOSTAZOL (PLETAL) 100 MG TABLET    TAKE 1 TABLET BY MOUTH EVERY DAY   CLONIDINE (CATAPRES) 0.1 MG TABLET    Take 0.1 mg by mouth daily.   CYCLOSPORINE (RESTASIS) 0.05 % OPHTHALMIC EMULSION    Place 1 drop into both eyes 2 (two) times daily. One drop once a day bilaterally for dry eyes   DULOXETINE (CYMBALTA)  30 MG CAPSULE    TAKE 1 CAPSULE BY MOUTH EVERY DAY TO HELP ANXIETY AND PAINS   EASY TOUCH INSULIN SYRINGE 31G X 5/16" 0.5 ML MISC    USE UP TO 2 TIMES A DAY AS DIRECTED   FUROSEMIDE (LASIX) 40 MG TABLET    Take 1 tablet (40 mg total) by mouth 2 (two) times daily.   GABAPENTIN (NEURONTIN) 100 MG CAPSULE    TAKE 2 CAPSULES BY MOUTH THREE TIMES A DAY   GUAIFENESIN (ROBITUSSIN) 100 MG/5ML SYRUP    Take 200 mg by mouth 3 (three) times daily as needed for cough.   HYDRALAZINE (APRESOLINE) 50 MG TABLET    TAKE 1 TABLET BY MOUTH TWICE DAILY   HYDROCODONE-ACETAMINOPHEN (NORCO) 10-325 MG TABLET    Take 1 tablet by mouth every 6 (six) hours as needed for moderate pain.   INSULIN NPH-REGULAR HUMAN (HUMULIN  70/30) (70-30) 100 UNIT/ML INJECTION    Inject 40 units subcutaneously with breakfast and 20 units with dinner to control blood sugar.   IPRATROPIUM-ALBUTEROL (DUONEB) 0.5-2.5 (3) MG/3ML SOLN    Take 3 mLs by nebulization every 4 (four) hours as needed.   MAGNESIUM OXIDE (MAG-OX) 400 MG TABLET    Take 1 tablet (400 mg total) by mouth 2 (two) times daily.   METOPROLOL SUCCINATE (TOPROL-XL) 25 MG 24 HR TABLET    TAKE 1 TABLET BY MOUTH EVERY DAY WITH OR IMMEDIATELY FOLLOWING A MEAL FOR BLOOD PRESSURE   NEXIUM 40 MG CAPSULE    TAKE 1 CAPSULE BY MOUTH ONCE DAILY   POLYETHYLENE GLYCOL (MIRALAX / GLYCOLAX) PACKET    Take 17 g by mouth daily.   POTASSIUM CHLORIDE SA (K-DUR,KLOR-CON) 20 MEQ TABLET    TAKE 1 TABLET BY MOUTH EVERY DAY   SERTRALINE (ZOLOFT) 50 MG TABLET    TAKE 1 TABLET BY MOUTH EVERY DAY   SIMVASTATIN (ZOCOR) 20 MG TABLET    TAKE 1 TABLET BY MOUTH EVERY DAY   SUCRALFATE (CARAFATE) 1 G TABLET    TAKE 1 TABLET BY MOUTH TWICE DAILY ON EMPTY STOMACH   TIMOLOL (TIMOPTIC) 0.5 % OPHTHALMIC SOLUTION    Place 1 drop into both eyes 2 (two) times daily.    TORSEMIDE (DEMADEX) 20 MG TABLET    Take 20 mg by mouth daily.   TRAVOPROST, BAK FREE, (TRAVATAN) 0.004 % SOLN OPHTHALMIC SOLUTION    Place 1 drop into both  eyes at bedtime. One drop bilaterally twice a day   VENTOLIN HFA 108 (90 BASE) MCG/ACT INHALER    INHALE 1 PUFF 4 TIMES A DAY FOR 5 DAYS, THEN USE EVERY 6 HOURS AS NEEDED   VOLTAREN 1 % GEL    APPLY 4 GRAM TOPICALLY TWICE DAILY  Modified Medications   No medications on file  Discontinued Medications   No medications on file     Physical Exam:  Filed Vitals:   01/07/16 1550  BP: 142/88  Pulse: 68  Temp: 98 F (36.7 C)  TempSrc: Oral  Resp: 20  Height: 5\' 2"  (1.575 m)  Weight: 232 lb 12.8 oz (105.597 kg)  SpO2: 93%   Body mass index is 42.57 kg/(m^2).  Physical Exam  Constitutional: She is oriented to person, place, and time. She appears well-developed and well-nourished. No distress.  Obese AA female, in NAD  HENT:  Head: Normocephalic and atraumatic.  Neck: Normal range of motion. Neck supple.  Cardiovascular: Normal rate, regular rhythm and normal heart sounds.   Pulmonary/Chest: Effort normal and breath sounds normal. No respiratory distress. She has no wheezes.  Abdominal: Soft. Bowel sounds are normal.  Musculoskeletal: She exhibits edema (2+bilaterally).  Uses rolling walker   Lymphadenopathy:    She has no cervical adenopathy.  Neurological: She is alert and oriented to person, place, and time.  Skin: Skin is warm and dry. She is not diaphoretic.  Psychiatric: She has a normal mood and affect.    Labs reviewed: Basic Metabolic Panel:  Recent Labs  10/29/15 1612  12/05/15 0247  12/08/15 1433  01/01/16 1022 01/01/16 1246 01/02/16 0146 01/03/16 0511  NA  --   < >  --   < >  --   < > 140  --  142 141  K  --   < >  --   < >  --   < > 3.7  --  3.4* 3.9  CL  --   < >  --   < >  --   < >  104  --  102 102  CO2  --   < >  --   < >  --   < > 24  --  29 29  GLUCOSE  --   < >  --   < >  --   < > 126*  --  139* 81  BUN  --   < >  --   < >  --   < > 25*  --  26* 20  CREATININE  --   < >  --   < >  --   < > 1.85* 1.83* 1.90* 1.87*  CALCIUM  --   < >  --   < >  --    < > 9.5  --  9.1 8.9  MG 1.5*  --  1.9  --   --   --   --  2.3  --   --   TSH  --   --   --   --  2.584  --   --  2.213  --   --   < > = values in this interval not displayed. Liver Function Tests:  Recent Labs  12/28/15 1424 01/01/16 1022 01/03/16 0511  AST 17 15 19   ALT 9* 10* 8*  ALKPHOS 105 110 99  BILITOT 0.2* 0.3 0.5  PROT 7.4 7.3 6.9  ALBUMIN 3.6 3.7 3.4*   No results for input(s): LIPASE, AMYLASE in the last 8760 hours. No results for input(s): AMMONIA in the last 8760 hours. CBC:  Recent Labs  08/20/15 1141  12/28/15 1424 01/01/16 1022 01/01/16 1246 01/03/16 0511  WBC 16.2*  < > 7.6 8.0 7.1 6.8  NEUTROABS 12.3*  --  4.5 4.4  --   --   HGB  --   < > 10.6* 10.7* 10.3* 10.4*  HCT 36.6  < > 33.6* 34.1* 33.1* 33.5*  MCV 85  < > 78.1 78.6 78.8 78.3  PLT 321  < > 268 285 256 278  < > = values in this interval not displayed. Lipid Panel:  Recent Labs  02/19/15 1057 03/14/15 0806  CHOL 174 147  HDL 44 41  LDLCALC 75 62  TRIG 275* 219*  CHOLHDL 4.0 3.6   TSH:  Recent Labs  12/08/15 1433 01/01/16 1246  TSH 2.584 2.213   A1C: Lab Results  Component Value Date   HGBA1C 8.0* 01/01/2016     Assessment/Plan 1. Acute on chronic diastolic congestive heart failure (HCC) Weight gain noted and recent hospitalization for CHF exacerbation will increase lasix to 60 mg BID for 2 days then to resume 40 mg BID dosing -low sodium diet.  - Basic metabolic panel - CBC with Differential - DG Chest 2 View  2. Simple chronic bronchitis ([place]) -to notify of nebs she is taking May use  ipratropium/albuterol neb every 6 hours as needed shortness of breath or wheezing.   3. Chronic kidney disease (CKD), stage IV (severe) (HCC) Will follow up renal function  4. Bilateral edema of lower extremity Elevation of LE, low sodium diet, may use compression hose, will increase diuretic for 2 days as well  5. Type 2 diabetes mellitus with diabetic nephropathy, with long-term  current use of insulin (HCC) A1c of 8 during hospitalization. Blood sugars variable, will cont current regimen for now and monitor.   6. Arthritis Ongoing arthritis, conts norco and voltaren gel.  - diclofenac sodium (VOLTAREN) 1 % GEL; APPLY 4 GRAM TOPICALLY TWICE DAILY  as needed for pain  Dispense: 100 g; Refill: 6  Following with home health and [facility]. To follow up in 3 weeks in office.   Carlos American. Harle Battiest  [facility] [phone] 8 am - 5 pm) [phone] (after hours)

## 2016-01-07 NOTE — Patient Instructions (Addendum)
Please call and review medication with one of our CMAs so we can confirm medication and nebulizers    To use ipratropium/albuterol neb every 6 hours as needed shortness of breath or wheezing.   Increase lasix to 60 mg twice daily for 2 days then resume 40 mg twice daily   To follow up with Cardiologist   Cancel all current appts with Janett Billow and follow up in 3 weeks

## 2016-01-08 ENCOUNTER — Encounter: Payer: Self-pay | Admitting: Pharmacist

## 2016-01-08 ENCOUNTER — Encounter: Payer: Self-pay | Admitting: *Deleted

## 2016-01-08 ENCOUNTER — Other Ambulatory Visit: Payer: Self-pay | Admitting: *Deleted

## 2016-01-08 ENCOUNTER — Telehealth: Payer: Self-pay | Admitting: *Deleted

## 2016-01-08 ENCOUNTER — Other Ambulatory Visit: Payer: Self-pay | Admitting: Pharmacist

## 2016-01-08 DIAGNOSIS — I5033 Acute on chronic diastolic (congestive) heart failure: Secondary | ICD-10-CM | POA: Diagnosis not present

## 2016-01-08 DIAGNOSIS — E1122 Type 2 diabetes mellitus with diabetic chronic kidney disease: Secondary | ICD-10-CM | POA: Diagnosis not present

## 2016-01-08 DIAGNOSIS — J449 Chronic obstructive pulmonary disease, unspecified: Secondary | ICD-10-CM | POA: Diagnosis not present

## 2016-01-08 DIAGNOSIS — I2510 Atherosclerotic heart disease of native coronary artery without angina pectoris: Secondary | ICD-10-CM | POA: Diagnosis not present

## 2016-01-08 DIAGNOSIS — J441 Chronic obstructive pulmonary disease with (acute) exacerbation: Secondary | ICD-10-CM

## 2016-01-08 DIAGNOSIS — I129 Hypertensive chronic kidney disease with stage 1 through stage 4 chronic kidney disease, or unspecified chronic kidney disease: Secondary | ICD-10-CM | POA: Diagnosis not present

## 2016-01-08 DIAGNOSIS — N184 Chronic kidney disease, stage 4 (severe): Secondary | ICD-10-CM | POA: Diagnosis not present

## 2016-01-08 LAB — CBC WITH DIFFERENTIAL/PLATELET
BASOS ABS: 0 10*3/uL (ref 0.0–0.2)
BASOS: 0 %
EOS (ABSOLUTE): 0.3 10*3/uL (ref 0.0–0.4)
Eos: 4 %
HEMATOCRIT: 32.8 % — AB (ref 34.0–46.6)
HEMOGLOBIN: 10.6 g/dL — AB (ref 11.1–15.9)
IMMATURE GRANS (ABS): 0 10*3/uL (ref 0.0–0.1)
Immature Granulocytes: 0 %
LYMPHS ABS: 2.3 10*3/uL (ref 0.7–3.1)
LYMPHS: 30 %
MCH: 24.6 pg — AB (ref 26.6–33.0)
MCHC: 32.3 g/dL (ref 31.5–35.7)
MCV: 76 fL — AB (ref 79–97)
MONOCYTES: 11 %
Monocytes Absolute: 0.9 10*3/uL (ref 0.1–0.9)
NEUTROS ABS: 4 10*3/uL (ref 1.4–7.0)
Neutrophils: 55 %
Platelets: 321 10*3/uL (ref 150–379)
RBC: 4.31 x10E6/uL (ref 3.77–5.28)
RDW: 17.9 % — ABNORMAL HIGH (ref 12.3–15.4)
WBC: 7.5 10*3/uL (ref 3.4–10.8)

## 2016-01-08 LAB — BASIC METABOLIC PANEL
BUN / CREAT RATIO: 12 (ref 11–26)
BUN: 21 mg/dL (ref 8–27)
CALCIUM: 9.3 mg/dL (ref 8.7–10.3)
CHLORIDE: 95 mmol/L — AB (ref 96–106)
CO2: 27 mmol/L (ref 18–29)
Creatinine, Ser: 1.77 mg/dL — ABNORMAL HIGH (ref 0.57–1.00)
GFR, EST AFRICAN AMERICAN: 31 mL/min/{1.73_m2} — AB (ref >59)
GFR, EST NON AFRICAN AMERICAN: 27 mL/min/{1.73_m2} — AB (ref >59)
Glucose: 112 mg/dL — ABNORMAL HIGH (ref 65–99)
POTASSIUM: 5.2 mmol/L (ref 3.5–5.2)
Sodium: 139 mmol/L (ref 134–144)

## 2016-01-08 MED ORDER — BUDESONIDE 0.25 MG/2ML IN SUSP: RESPIRATORY_TRACT | Status: DC

## 2016-01-08 MED ORDER — IPRATROPIUM-ALBUTEROL 0.5-2.5 (3) MG/3ML IN SOLN: RESPIRATORY_TRACT | Status: DC

## 2016-01-08 NOTE — Telephone Encounter (Signed)
Please call pt and family member back and notify that budesonide (pulmicort) should be taken twice daily routinely and that duoneb can be taken every 6 hours as needed for wheezing/shortness of breath.  Budesonide is her maintenance  medication and it is important to take this twice daily schedule to prevent worsening of her COPD. Also if needed duoneb can be taken for wheezing/shortness of breath as needed throughout the day/night

## 2016-01-08 NOTE — Telephone Encounter (Signed)
Rachel with [facility] called with update on patient's respiratory medications. Stated that she is taking the Budesonide Neb Sol only as needed and the DuoNeb once daily or as needed. Medication list updated.

## 2016-01-08 NOTE — Patient Outreach (Signed)
[facility]) Care Management  [place]   01/08/2016  Michelle Buck 1936-03-06 [id number]  Subjective: Michelle Buck is a 79yo who was referred to [place] for medication review/education.  I made initial home visit today to review medications with patient.  Visit was a joint visit with [facility] CMRN Michelle Buck.  Patient's brother was also present during the visit.  Patient fills all prescriptions through [facility] and has her medications delivered to her home.  She reports her sister assists her in filling a weekly pill box every Sunday.  Patient reports compliance with medications.    Objective:   Current Medications: Current Outpatient Prescriptions  Medication Sig Dispense Refill  . AMITIZA 24 MCG capsule TAKE 1 CAPSULE BY MOUTH 2 TIMES A DAY WITH MEALS (Patient taking differently: TAKE 24 MCG BY MOUTH 2 TIMES A DAY WITH MEALS) 60 capsule 5  . aspirin EC 81 MG tablet Take 81 mg by mouth daily.    . budesonide (PULMICORT) 0.25 MG/2ML nebulizer solution Take 2 mLs (0.25 mg total) by nebulization 2 (two) times daily. 60 mL 1  . cilostazol (PLETAL) 100 MG tablet TAKE 1 TABLET BY MOUTH EVERY DAY (Patient taking differently: TAKE 100 MG BY MOUTH EVERY DAY) 30 tablet 5  . cloNIDine (CATAPRES) 0.1 MG tablet Take 0.1 mg by mouth daily.    . cycloSPORINE (RESTASIS) 0.05 % ophthalmic emulsion Place 1 drop into both eyes 2 (two) times daily. One drop once a day bilaterally for dry eyes    . diclofenac sodium (VOLTAREN) 1 % GEL APPLY 4 GRAM TOPICALLY TWICE DAILY as needed for pain 100 g 6  . DULoxetine (CYMBALTA) 30 MG capsule TAKE 1 CAPSULE BY MOUTH EVERY DAY TO HELP ANXIETY AND PAINS (Patient taking differently: TAKE 30 MG BY MOUTH EVERY DAY TO HELP ANXIETY AND PAINS) 30 capsule 5  . EASY TOUCH INSULIN SYRINGE 31G X 5/16" 0.5 ML MISC USE UP TO 2 TIMES A DAY AS DIRECTED 100 each 2  . furosemide (LASIX) 40 MG tablet Take 1 tablet (40 mg total) by mouth 2  (two) times daily. 60 tablet 1  . gabapentin (NEURONTIN) 100 MG capsule TAKE 2 CAPSULES BY MOUTH THREE TIMES A DAY (Patient taking differently: TAKE 200 MG BY MOUTH THREE TIMES A DAY) 180 capsule 5  . guaifenesin (ROBITUSSIN) 100 MG/5ML syrup Take 200 mg by mouth 3 (three) times daily as needed for cough.    . hydrALAZINE (APRESOLINE) 50 MG tablet TAKE 1 TABLET BY MOUTH TWICE DAILY (Patient taking differently: TAKE 50 MG BY MOUTH TWICE DAILY) 60 tablet 5  . HYDROcodone-acetaminophen (NORCO) 10-325 MG tablet Take 1 tablet by mouth every 6 (six) hours as needed for moderate pain. 90 tablet 0  . insulin NPH-regular Human (HUMULIN 70/30) (70-30) 100 UNIT/ML injection Inject 40 units subcutaneously with breakfast and 20 units with dinner to control blood sugar.    . ipratropium-albuterol (DUONEB) 0.5-2.5 (3) MG/3ML SOLN Take 3 mLs by nebulization every 4 (four) hours as needed. (Patient taking differently: Take 3 mLs by nebulization every 4 (four) hours as needed (SOB, wheezing). ) 360 mL 3  . magnesium oxide (MAG-OX) 400 MG tablet Take 1 tablet (400 mg total) by mouth 2 (two) times daily. 60 tablet 2  . metoprolol succinate (TOPROL-XL) 25 MG 24 hr tablet TAKE 1 TABLET BY MOUTH EVERY DAY WITH OR IMMEDIATELY FOLLOWING A MEAL FOR BLOOD PRESSURE 30 tablet 3  . NEXIUM 40 MG capsule TAKE 1  CAPSULE BY MOUTH ONCE DAILY (Patient taking differently: TAKE 40 MG BY MOUTH ONCE DAILY) 30 capsule 5  . polyethylene glycol (MIRALAX / GLYCOLAX) packet Take 17 g by mouth daily. 14 each 0  . potassium chloride SA (K-DUR,KLOR-CON) 20 MEQ tablet TAKE 1 TABLET BY MOUTH EVERY DAY (Patient taking differently: TAKE 20 MEQ BY MOUTH EVERY DAY) 30 tablet 5  . simvastatin (ZOCOR) 20 MG tablet TAKE 1 TABLET BY MOUTH EVERY DAY 30 tablet 3  . sucralfate (CARAFATE) 1 g tablet TAKE 1 TABLET BY MOUTH TWICE DAILY ON EMPTY STOMACH (Patient taking differently: TAKE 1 G BY MOUTH TWICE DAILY ON EMPTY STOMACH) 60 tablet 2  . timolol (TIMOPTIC) 0.5  % ophthalmic solution Place 1 drop into both eyes 2 (two) times daily.     Marland Kitchen torsemide (DEMADEX) 20 MG tablet Take 20 mg by mouth daily.    . Travoprost, BAK Free, (TRAVATAN) 0.004 % SOLN ophthalmic solution Place 1 drop into both eyes at bedtime. One drop bilaterally twice a day    . VENTOLIN HFA 108 (90 Base) MCG/ACT inhaler INHALE 1 PUFF 4 TIMES A DAY FOR 5 DAYS, THEN USE EVERY 6 HOURS AS NEEDED (Patient taking differently: INHALE 1 PUFF 4 TIMES A DAY FOR 5 DAYS, THEN USE EVERY 6 HOURS AS NEEDED for shortness of breath/wheezing) 18 Inhaler 5  . sertraline (ZOLOFT) 50 MG tablet TAKE 1 TABLET BY MOUTH EVERY DAY (Patient not taking: Reported on 01/08/2016) 30 tablet 3   No current facility-administered medications for this visit.    Functional Status: In your present state of health, do you have any difficulty performing the following activities: 01/01/2016 12/31/2015  Hearing? Y (No Data)  Vision? N N  Difficulty concentrating or making decisions? N N  Walking or climbing stairs? Y Y  Dressing or bathing? N -  Doing errands, shopping? Michelle Buck    Fall/Depression Screening: PHQ 2/9 Scores 01/08/2016 12/31/2015 12/10/2015 05/30/2015 10/10/2014 05/23/2013  PHQ - 2 Score 0 0 0 0 0 0    Assessment: 1.  Medication review:   Drugs sorted by system:  Neurologic/Psychologic: duloxetine  Cardiovascular: aspirin, cilostazol, clonidine, furosemide, hydralazine, metoprolol succinate, potassium, simvastatin, torsemide  Pulmonary/Allergy: albuterol HFA, budesonide nebulizer solution, guaifenesin, ipratropium-albuterol nebulizer solution  Gastrointestinal: esomeprazole, lubiprostone, polyethylene glycol, sucralafate  Endocrine: Novolin 70/30  Renal: none  Topical: Restasis, Voltaren gel, timolol, travoprost  Pain: gabapentin, hydrocodone-acetaminophen  Vitamins/Minerals: magnesium  Infectious Diseases: none  Miscellaneous: none   Duplications in therapy: Patient's medication list currently  includes both torsemide and furosemide and she reports taking both - will send patient's PCP a message to request clarification as to whether patient should be on two loop diuetics.   Gaps in therapy: ACE inhibitor or ARB indicated for DM and eGFR < 60 Medications to avoid in the elderly: esomeprazole (risk of Clostridium difficile infection and bone loss and fractures) Drug interactions: none noted Other issues noted:  - Patient is on cilostazol and has diagnosis of heart failure. Use of cilostazol is contraindicated in patients with heart failure of any severity.  - Patient has allergy listed to hydrocodone (nausea and vomiting).  She is currently prescribed hydrocodone/acetaminophen 10-'325mg'$  every 6 hours as needed.  Patient reports she takes the medication as needed and denies any adverse effects.     2.  Medication adherence: Patient reports compliance with medications.  Her sister assists patient by filling a weekly pill box.  Reviewed all of patient's medications and discussed purpose and proper use.  Patient voices understanding of all medications and what they are for.  Reviewed patient's nebulizer medications.  Patient had visit with her PCP on 01/07/16 and during visit patient could not recall which nebulizer medications she was on.  PCP asked for a return call from the patient to review which medications she was taking with the nebulizer.  Patient reports she has only been using budesonide as needed and reports using albuterol-ipratropium nebulizer daily or as needed.  I called patient's PCP office to inform them of this information.  I reviewed with patient that budesonide is prescribed to be taken twice daily and that albuterol-ipratropium can be taken every 4 hours as needed.  I reviewed purpose, proper use, and adverse effects of the medications including difference between controller and rescue medications and importance of rinsing mouth out after using budesonide.  Patient voiced  understanding.     Plan: 1.  Medication review:   - Patient is currently prescribed two loop diuretics (torsemide and furosemide).  Will send patient's PCP a message to please confirm whether patient should be on both.  - Patient is not currently on an ACE inhibitor or ARB.  Patient has indication for ACE inhibitor or ARB due to diabetes and eGFR <60 mg/dL.   - Patient is on cilostazol and has diagnosis of heart failure.  Cilostazol is contraindicated in patients with heart failure of any severity.  Please weigh risks versus benefits of continued use.   Will route note to patient's PCP with this information.    2.  Medication adherence:  I reviewed purpose and proper use of all medications with patient and patient reports compliance.  I counseled patient to continue to take all medications as prescribed including budesonide twice daily even if her breathing is good.  Patient voices understanding.  Patient reports she now has an understanding of all medications and denies any further pharmacy needs at this time.  I will close pharmacy program.  I have alerted [facility] CMRN Michelle Buck.  In addition I provided patient with my phone number should she have any further questions or concerns regarding her medications.     [name], Pharm.D. Pharmacy Resident [place] [phone]

## 2016-01-08 NOTE — Patient Outreach (Signed)
[facility]) Care Management   01/08/2016  Michelle Buck 10-13-1936 [id number]  Michelle Buck is an 80 y.o. female Pt remain receptive to [facility] enrollment Subjective:  HF:  Pt not aware of HF and how to monitoring this medication condition however reports she has been weighing and tries to document all readings. Pt receptive to education material concerning her medical conditions and appreciative of the teachings provided today. After education pt states she is in the GREEN zone however has some ongoing swelling to her lower feet and legs due to other medical condition she is unable to elevate her lower legs and must sleep in a particular position that is comfortable during the night which is limited. Pt declined compression stocking when discussed and offered but understands the purpose of the stockings and how they are used. Pt verified her recent follow up was 2/7 with her provider with no major changes.  DM/COPD: Pt also lacking knowledge concerning other medical condition. Pt receptive to printed information of her other medical condition however reports these medical conditional are managed well with good BP and her CBG readings are good as well. Pt express her gratitude for the teachings on her medication conditions and will continue to manage her medical issues as she can with her family and brother's assistance. Pt reports her usage of her nebulizer and inhalers to the visiting pharmacy Michelle Buck, [facility] during today's home visit.  MEDICAL APPOINTMENTS: Pt states she has attend all medication appointments "so far" since her recent discharge but has other appointments. States she has had so many calls for appointments it's hard to keep up with them all.  COMMUNITY RESOURCES: Pt discussed all involved services and other services being uses with [employer] workers. Objective:   Review of Systems  Constitutional: Negative.   HENT: Negative.   Eyes: Negative.   Respiratory: Positive for  wheezing.        Audible slight wheeze.  Cardiovascular: Negative.   Gastrointestinal: Negative.   Genitourinary: Negative.   Musculoskeletal:       Pt off and on with chronic hip/leg pain  Skin: Negative.   Neurological: Negative.   Endo/Heme/Allergies: Negative.   Psychiatric/Behavioral: Negative.     Physical Exam  Constitutional: She is oriented to person, place, and time. She appears well-developed and well-nourished.  HENT:  Right Ear: External ear normal.  Left Ear: External ear normal.  Eyes: EOM are normal.  Neck: Normal range of motion.  Cardiovascular: Normal heart sounds.   Respiratory: Effort normal and breath sounds normal.  GI: Soft. Bowel sounds are normal.  Musculoskeletal: Normal range of motion.  Neurological: She is alert and oriented to person, place, and time.  Skin: Skin is warm and dry.  Psychiatric: She has a normal mood and affect. Her behavior is normal. Judgment and thought content normal.    Current Medications:   Current Outpatient Prescriptions  Medication Sig Dispense Refill  . AMITIZA 24 MCG capsule TAKE 1 CAPSULE BY MOUTH 2 TIMES A DAY WITH MEALS (Patient taking differently: TAKE 24 MCG BY MOUTH 2 TIMES A DAY WITH MEALS) 60 capsule 5  . aspirin EC 81 MG tablet Take 81 mg by mouth daily.    . budesonide (PULMICORT) 0.25 MG/2ML nebulizer solution Take one ampule in Neb as needed 60 mL 1  . cilostazol (PLETAL) 100 MG tablet TAKE 1 TABLET BY MOUTH EVERY DAY (Patient taking differently: TAKE 100 MG BY MOUTH EVERY DAY) 30 tablet 5  . cloNIDine (CATAPRES) 0.1 MG  tablet Take 0.1 mg by mouth daily.    . cycloSPORINE (RESTASIS) 0.05 % ophthalmic emulsion Place 1 drop into both eyes 2 (two) times daily. One drop once a day bilaterally for dry eyes    . diclofenac sodium (VOLTAREN) 1 % GEL APPLY 4 GRAM TOPICALLY TWICE DAILY as needed for pain 100 g 6  . DULoxetine (CYMBALTA) 30 MG capsule TAKE 1 CAPSULE BY MOUTH EVERY DAY TO HELP ANXIETY AND PAINS (Patient  taking differently: TAKE 30 MG BY MOUTH EVERY DAY TO HELP ANXIETY AND PAINS) 30 capsule 5  . EASY TOUCH INSULIN SYRINGE 31G X 5/16" 0.5 ML MISC USE UP TO 2 TIMES A DAY AS DIRECTED 100 each 2  . furosemide (LASIX) 40 MG tablet Take 1 tablet (40 mg total) by mouth 2 (two) times daily. 60 tablet 1  . gabapentin (NEURONTIN) 100 MG capsule TAKE 2 CAPSULES BY MOUTH THREE TIMES A DAY (Patient taking differently: TAKE 200 MG BY MOUTH THREE TIMES A DAY) 180 capsule 5  . guaifenesin (ROBITUSSIN) 100 MG/5ML syrup Take 200 mg by mouth 3 (three) times daily as needed for cough.    . hydrALAZINE (APRESOLINE) 50 MG tablet TAKE 1 TABLET BY MOUTH TWICE DAILY (Patient taking differently: TAKE 50 MG BY MOUTH TWICE DAILY) 60 tablet 5  . HYDROcodone-acetaminophen (NORCO) 10-325 MG tablet Take 1 tablet by mouth every 6 (six) hours as needed for moderate pain. 90 tablet 0  . insulin NPH-regular Human (HUMULIN 70/30) (70-30) 100 UNIT/ML injection Inject 40 units subcutaneously with breakfast and 20 units with dinner to control blood sugar.    . ipratropium-albuterol (DUONEB) 0.5-2.5 (3) MG/3ML SOLN Take 15mls by nebulization once daily or as needed 360 mL 3  . magnesium oxide (MAG-OX) 400 MG tablet Take 1 tablet (400 mg total) by mouth 2 (two) times daily. 60 tablet 2  . metoprolol succinate (TOPROL-XL) 25 MG 24 hr tablet TAKE 1 TABLET BY MOUTH EVERY DAY WITH OR IMMEDIATELY FOLLOWING A MEAL FOR BLOOD PRESSURE 30 tablet 3  . NEXIUM 40 MG capsule TAKE 1 CAPSULE BY MOUTH ONCE DAILY (Patient taking differently: TAKE 40 MG BY MOUTH ONCE DAILY) 30 capsule 5  . polyethylene glycol (MIRALAX / GLYCOLAX) packet Take 17 g by mouth daily. 14 each 0  . potassium chloride SA (K-DUR,KLOR-CON) 20 MEQ tablet TAKE 1 TABLET BY MOUTH EVERY DAY (Patient taking differently: TAKE 20 MEQ BY MOUTH EVERY DAY) 30 tablet 5  . sertraline (ZOLOFT) 50 MG tablet TAKE 1 TABLET BY MOUTH EVERY DAY (Patient not taking: Reported on 01/08/2016) 30 tablet 3  .  simvastatin (ZOCOR) 20 MG tablet TAKE 1 TABLET BY MOUTH EVERY DAY 30 tablet 3  . sucralfate (CARAFATE) 1 g tablet TAKE 1 TABLET BY MOUTH TWICE DAILY ON EMPTY STOMACH (Patient taking differently: TAKE 1 G BY MOUTH TWICE DAILY ON EMPTY STOMACH) 60 tablet 2  . timolol (TIMOPTIC) 0.5 % ophthalmic solution Place 1 drop into both eyes 2 (two) times daily.     Marland Kitchen torsemide (DEMADEX) 20 MG tablet Take 20 mg by mouth daily.    . Travoprost, BAK Free, (TRAVATAN) 0.004 % SOLN ophthalmic solution Place 1 drop into both eyes at bedtime. One drop bilaterally twice a day    . VENTOLIN HFA 108 (90 Base) MCG/ACT inhaler INHALE 1 PUFF 4 TIMES A DAY FOR 5 DAYS, THEN USE EVERY 6 HOURS AS NEEDED (Patient taking differently: INHALE 1 PUFF 4 TIMES A DAY FOR 5 DAYS, THEN USE EVERY 6 HOURS  AS NEEDED for shortness of breath/wheezing) 18 Inhaler 5   No current facility-administered medications for this visit.    Functional Status:   In your present state of health, do you have any difficulty performing the following activities: 01/01/2016 12/31/2015  Hearing? Y (No Data)  Vision? N N  Difficulty concentrating or making decisions? N N  Walking or climbing stairs? Y Y  Dressing or bathing? N -  Doing errands, shopping? Tempie Donning    Fall/Depression Screening:    PHQ 2/9 Scores 01/08/2016 12/31/2015 12/10/2015 05/30/2015 10/10/2014 05/23/2013  PHQ - 2 Score 0 0 0 0 0 0   Filed Vitals:   01/08/16 1503  BP: 118/62  Pulse: 69  Resp: 20    Assessment:   Reintroduced [facility] program and services  Case management related to HF DM/COPD Adherence related to medical appointments since recent discharge  Involved Caregivers and community resources.  Plan:  Physical assessment completed with noted issues. Will obtain a signed consent for [facility] services and enroll pt into the HF program for community home visits. Will educate pt on HF to increase her knowledge base and how to manage her medical condition better with early detection to  prevent possible admissions. Will educate on HF zones and verified pt is in the [place] however pt has history of ongoing swelling to her lower feet and legs current at 1-2+. Encouraged elevation and offered compression stockings. Pt unable to elevate within experiencing pain and refused compression stockings. Will review the printed material delivered to day via EMMI information and calendar. Will encouraged pt to use the available tools for daily monitoring and to document all readings accordingly.  Will inquired on DM and COPD management during today's home visits. Will again provide information on both of these medical condition and encouraged pt with ongoing monitoring tools with daily accuchecks and diabetic supplies and COPD action zone and what to do if acute symptoms should occur. Education also provided and discussed for pt's knowledge base.  Michelle Buck verified the correct usage of her inhalers to avoid future wheezing or distress breathing with her nebulizer use  and inhalers.  Will verified pt continues to attend all medical appointments and continue to encouraged adherence to avoid possible "SHOW FEES" or problems that may occur.  Will verified involved caregivers with [place] for PT/OT and [facility] pharmacy.  Visiting today Pharmacy [facility], OT and PT will be visiting on Friday this week. Will discuss other community resource that may be able to assist pt further such as Mobile meals (declined), Silver Sneakers at the local gyms and MCD services with [employer] (currently verified with 10 hrs weekly), MCD transportation services ect.. Plan of care discussed alone with goals and expected outcomes if pt remains adherent with the plan of care (pt receptive). Will scheduled next month's home visit and re-evaluate pt's progress and adjust plan of care accordingly. Will update pt's primary of [facility] involvement and send noted for today.  [name], RN Care Management Coordinator [place] Office [phone]

## 2016-01-09 ENCOUNTER — Other Ambulatory Visit: Payer: Self-pay | Admitting: Nurse Practitioner

## 2016-01-09 ENCOUNTER — Telehealth: Payer: Self-pay

## 2016-01-09 MED ORDER — BUDESONIDE 0.25 MG/2ML IN SUSP: RESPIRATORY_TRACT | Status: DC

## 2016-01-09 MED ORDER — IPRATROPIUM-ALBUTEROL 0.5-2.5 (3) MG/3ML IN SOLN: RESPIRATORY_TRACT | Status: DC

## 2016-01-09 NOTE — Telephone Encounter (Signed)
I will have our CMAs tol call the patient and have her take torsemide only (to stop lasix) and to stop pletal.      Due to Cr of 1.77 will not start ACE/ARB at this time but will monitor for this.    Thank you    Janett Billow     Discussed with patient, patient verbalized understanding medication changes. Medication list was already updated by Janett Billow.

## 2016-01-09 NOTE — Telephone Encounter (Signed)
Patient and Apolonio Schneiders with [facility] notified and agreed. Medication list updated. Rachel with [facility] also stated that patient is taking 2 fluid pills, Torsemide and Furosemide and not sure if she should be taking both of these. Please Advise.

## 2016-01-10 ENCOUNTER — Other Ambulatory Visit: Payer: Self-pay | Admitting: *Deleted

## 2016-01-10 DIAGNOSIS — M545 Low back pain, unspecified: Secondary | ICD-10-CM

## 2016-01-10 DIAGNOSIS — J449 Chronic obstructive pulmonary disease, unspecified: Secondary | ICD-10-CM | POA: Diagnosis not present

## 2016-01-10 DIAGNOSIS — E1122 Type 2 diabetes mellitus with diabetic chronic kidney disease: Secondary | ICD-10-CM | POA: Diagnosis not present

## 2016-01-10 DIAGNOSIS — I129 Hypertensive chronic kidney disease with stage 1 through stage 4 chronic kidney disease, or unspecified chronic kidney disease: Secondary | ICD-10-CM | POA: Diagnosis not present

## 2016-01-10 DIAGNOSIS — G8929 Other chronic pain: Secondary | ICD-10-CM

## 2016-01-10 DIAGNOSIS — N184 Chronic kidney disease, stage 4 (severe): Secondary | ICD-10-CM | POA: Diagnosis not present

## 2016-01-10 DIAGNOSIS — I5033 Acute on chronic diastolic (congestive) heart failure: Secondary | ICD-10-CM | POA: Diagnosis not present

## 2016-01-10 DIAGNOSIS — I2510 Atherosclerotic heart disease of native coronary artery without angina pectoris: Secondary | ICD-10-CM | POA: Diagnosis not present

## 2016-01-10 MED ORDER — HYDROCODONE-ACETAMINOPHEN 10-325 MG PO TABS: 1.0000 | ORAL_TABLET | Freq: Four times a day (QID) | ORAL | Status: DC | PRN

## 2016-01-10 NOTE — Telephone Encounter (Signed)
Patient requested and caregiver will pick up

## 2016-01-13 ENCOUNTER — Telehealth: Payer: Self-pay

## 2016-01-13 ENCOUNTER — Encounter: Payer: Self-pay | Admitting: Nurse Practitioner

## 2016-01-13 DIAGNOSIS — E1122 Type 2 diabetes mellitus with diabetic chronic kidney disease: Secondary | ICD-10-CM | POA: Diagnosis not present

## 2016-01-13 DIAGNOSIS — N184 Chronic kidney disease, stage 4 (severe): Secondary | ICD-10-CM | POA: Diagnosis not present

## 2016-01-13 DIAGNOSIS — I129 Hypertensive chronic kidney disease with stage 1 through stage 4 chronic kidney disease, or unspecified chronic kidney disease: Secondary | ICD-10-CM | POA: Diagnosis not present

## 2016-01-13 DIAGNOSIS — I5033 Acute on chronic diastolic (congestive) heart failure: Secondary | ICD-10-CM | POA: Diagnosis not present

## 2016-01-13 DIAGNOSIS — I2510 Atherosclerotic heart disease of native coronary artery without angina pectoris: Secondary | ICD-10-CM | POA: Diagnosis not present

## 2016-01-13 DIAGNOSIS — J449 Chronic obstructive pulmonary disease, unspecified: Secondary | ICD-10-CM | POA: Diagnosis not present

## 2016-01-13 NOTE — Telephone Encounter (Signed)
Spoke with patient per Jessica's message. She will try to get someone to take her tomorrow to get chest x-ray

## 2016-01-13 NOTE — Telephone Encounter (Signed)
-----   Message from [name], NP sent at 01/13/2016  9:09 AM EST ----- Please call pt and follow up, she was supposed to get chest xray after visit last week and has not gotten this.   ----- Message -----    From: SYSTEM    Sent: 01/12/2016  12:05 AM      To: [name], NP

## 2016-01-13 NOTE — Progress Notes (Signed)
Spoke with patient about getting her chest x-ray per Jessica's message. She will check on it today for someone to take her tomorrow.

## 2016-01-14 ENCOUNTER — Ambulatory Visit: Payer: Medicare Other | Admitting: Nurse Practitioner

## 2016-01-14 DIAGNOSIS — I509 Heart failure, unspecified: Secondary | ICD-10-CM | POA: Diagnosis not present

## 2016-01-14 DIAGNOSIS — I5033 Acute on chronic diastolic (congestive) heart failure: Secondary | ICD-10-CM | POA: Diagnosis not present

## 2016-01-14 DIAGNOSIS — J441 Chronic obstructive pulmonary disease with (acute) exacerbation: Secondary | ICD-10-CM | POA: Diagnosis not present

## 2016-01-14 DIAGNOSIS — J449 Chronic obstructive pulmonary disease, unspecified: Secondary | ICD-10-CM | POA: Diagnosis not present

## 2016-01-15 ENCOUNTER — Ambulatory Visit
Admission: RE | Admit: 2016-01-15 | Discharge: 2016-01-15 | Disposition: A | Discharge status: Home / Self Care | Payer: Medicare Other | Source: Ambulatory Visit | Attending: Nurse Practitioner | Admitting: Nurse Practitioner

## 2016-01-15 ENCOUNTER — Other Ambulatory Visit: Payer: Self-pay | Admitting: Nurse Practitioner

## 2016-01-15 DIAGNOSIS — R0789 Other chest pain: Secondary | ICD-10-CM | POA: Diagnosis not present

## 2016-01-15 DIAGNOSIS — I129 Hypertensive chronic kidney disease with stage 1 through stage 4 chronic kidney disease, or unspecified chronic kidney disease: Secondary | ICD-10-CM | POA: Diagnosis not present

## 2016-01-15 DIAGNOSIS — R0602 Shortness of breath: Secondary | ICD-10-CM | POA: Diagnosis not present

## 2016-01-15 DIAGNOSIS — N184 Chronic kidney disease, stage 4 (severe): Secondary | ICD-10-CM | POA: Diagnosis not present

## 2016-01-15 DIAGNOSIS — I2510 Atherosclerotic heart disease of native coronary artery without angina pectoris: Secondary | ICD-10-CM | POA: Diagnosis not present

## 2016-01-15 DIAGNOSIS — J449 Chronic obstructive pulmonary disease, unspecified: Secondary | ICD-10-CM | POA: Diagnosis not present

## 2016-01-15 DIAGNOSIS — E1122 Type 2 diabetes mellitus with diabetic chronic kidney disease: Secondary | ICD-10-CM | POA: Diagnosis not present

## 2016-01-15 DIAGNOSIS — I5033 Acute on chronic diastolic (congestive) heart failure: Secondary | ICD-10-CM | POA: Diagnosis not present

## 2016-01-16 DIAGNOSIS — I2510 Atherosclerotic heart disease of native coronary artery without angina pectoris: Secondary | ICD-10-CM | POA: Diagnosis not present

## 2016-01-16 DIAGNOSIS — N184 Chronic kidney disease, stage 4 (severe): Secondary | ICD-10-CM | POA: Diagnosis not present

## 2016-01-16 DIAGNOSIS — E1122 Type 2 diabetes mellitus with diabetic chronic kidney disease: Secondary | ICD-10-CM | POA: Diagnosis not present

## 2016-01-16 DIAGNOSIS — J449 Chronic obstructive pulmonary disease, unspecified: Secondary | ICD-10-CM | POA: Diagnosis not present

## 2016-01-16 DIAGNOSIS — I5033 Acute on chronic diastolic (congestive) heart failure: Secondary | ICD-10-CM | POA: Diagnosis not present

## 2016-01-16 DIAGNOSIS — I129 Hypertensive chronic kidney disease with stage 1 through stage 4 chronic kidney disease, or unspecified chronic kidney disease: Secondary | ICD-10-CM | POA: Diagnosis not present

## 2016-01-20 ENCOUNTER — Other Ambulatory Visit: Payer: Self-pay | Admitting: *Deleted

## 2016-01-20 DIAGNOSIS — J449 Chronic obstructive pulmonary disease, unspecified: Secondary | ICD-10-CM | POA: Diagnosis not present

## 2016-01-20 DIAGNOSIS — I129 Hypertensive chronic kidney disease with stage 1 through stage 4 chronic kidney disease, or unspecified chronic kidney disease: Secondary | ICD-10-CM | POA: Diagnosis not present

## 2016-01-20 DIAGNOSIS — I5033 Acute on chronic diastolic (congestive) heart failure: Secondary | ICD-10-CM | POA: Diagnosis not present

## 2016-01-20 DIAGNOSIS — N184 Chronic kidney disease, stage 4 (severe): Secondary | ICD-10-CM | POA: Diagnosis not present

## 2016-01-20 DIAGNOSIS — I2510 Atherosclerotic heart disease of native coronary artery without angina pectoris: Secondary | ICD-10-CM | POA: Diagnosis not present

## 2016-01-20 DIAGNOSIS — E1122 Type 2 diabetes mellitus with diabetic chronic kidney disease: Secondary | ICD-10-CM | POA: Diagnosis not present

## 2016-01-20 NOTE — Patient Outreach (Signed)
[facility]) Care Management  01/20/2016  Michelle Buck 31-Aug-1936 [id number]  Telephone Assessment  RN spoke with pt today and inquired on her ongoing HF management. Pt reports she is doing well however has some ongoing history of swelling ot her legs and ankles but this swelling is much better at night prior to the morning. Pt reports her weights over the last week 5 days ago at 225 lbs and yesterday 226 lbs and today 227 lbs with no SOB or reported issues other then her ongoing swelling. Pt denies any admission or emergency visits to any facility since RN's last home visit and able to recite the educations discussed earlier in this month on the last home visit related to HF zone. Other then the history of swelling pt reports she is in the GREEN zone. RN will follow up accordingly with pt and verified the next home visit.  [name], RN Care Management Coordinator [place] Office [phone]

## 2016-01-21 ENCOUNTER — Other Ambulatory Visit: Payer: Self-pay | Admitting: Internal Medicine

## 2016-01-21 ENCOUNTER — Ambulatory Visit: Payer: Self-pay | Admitting: Nurse Practitioner

## 2016-01-21 ENCOUNTER — Other Ambulatory Visit: Payer: Self-pay | Admitting: Nurse Practitioner

## 2016-01-21 DIAGNOSIS — I129 Hypertensive chronic kidney disease with stage 1 through stage 4 chronic kidney disease, or unspecified chronic kidney disease: Secondary | ICD-10-CM | POA: Diagnosis not present

## 2016-01-21 DIAGNOSIS — I5033 Acute on chronic diastolic (congestive) heart failure: Secondary | ICD-10-CM | POA: Diagnosis not present

## 2016-01-21 DIAGNOSIS — E1122 Type 2 diabetes mellitus with diabetic chronic kidney disease: Secondary | ICD-10-CM | POA: Diagnosis not present

## 2016-01-21 DIAGNOSIS — J449 Chronic obstructive pulmonary disease, unspecified: Secondary | ICD-10-CM | POA: Diagnosis not present

## 2016-01-21 DIAGNOSIS — I2510 Atherosclerotic heart disease of native coronary artery without angina pectoris: Secondary | ICD-10-CM | POA: Diagnosis not present

## 2016-01-21 DIAGNOSIS — N184 Chronic kidney disease, stage 4 (severe): Secondary | ICD-10-CM | POA: Diagnosis not present

## 2016-01-22 ENCOUNTER — Telehealth: Payer: Self-pay

## 2016-01-22 DIAGNOSIS — J449 Chronic obstructive pulmonary disease, unspecified: Secondary | ICD-10-CM | POA: Diagnosis not present

## 2016-01-22 DIAGNOSIS — I5033 Acute on chronic diastolic (congestive) heart failure: Secondary | ICD-10-CM | POA: Diagnosis not present

## 2016-01-22 DIAGNOSIS — I2510 Atherosclerotic heart disease of native coronary artery without angina pectoris: Secondary | ICD-10-CM | POA: Diagnosis not present

## 2016-01-22 DIAGNOSIS — E1122 Type 2 diabetes mellitus with diabetic chronic kidney disease: Secondary | ICD-10-CM | POA: Diagnosis not present

## 2016-01-22 DIAGNOSIS — I129 Hypertensive chronic kidney disease with stage 1 through stage 4 chronic kidney disease, or unspecified chronic kidney disease: Secondary | ICD-10-CM | POA: Diagnosis not present

## 2016-01-22 DIAGNOSIS — N184 Chronic kidney disease, stage 4 (severe): Secondary | ICD-10-CM | POA: Diagnosis not present

## 2016-01-22 NOTE — Telephone Encounter (Signed)
Received fax from [facility] [phone] and Humulin 70/30 was APPROVED 11/29/15-11/29/16

## 2016-01-22 NOTE — Telephone Encounter (Signed)
Manual fax was received from [place] to initiate PA through [email or web address], PA was initiated and sent electronically for review. We are awaiting response from the insurance company, that can take 24-72 hours for review.

## 2016-01-23 DIAGNOSIS — I5033 Acute on chronic diastolic (congestive) heart failure: Secondary | ICD-10-CM | POA: Diagnosis not present

## 2016-01-23 DIAGNOSIS — I129 Hypertensive chronic kidney disease with stage 1 through stage 4 chronic kidney disease, or unspecified chronic kidney disease: Secondary | ICD-10-CM | POA: Diagnosis not present

## 2016-01-23 DIAGNOSIS — J449 Chronic obstructive pulmonary disease, unspecified: Secondary | ICD-10-CM | POA: Diagnosis not present

## 2016-01-23 DIAGNOSIS — E1122 Type 2 diabetes mellitus with diabetic chronic kidney disease: Secondary | ICD-10-CM | POA: Diagnosis not present

## 2016-01-23 DIAGNOSIS — N184 Chronic kidney disease, stage 4 (severe): Secondary | ICD-10-CM | POA: Diagnosis not present

## 2016-01-23 DIAGNOSIS — I2510 Atherosclerotic heart disease of native coronary artery without angina pectoris: Secondary | ICD-10-CM | POA: Diagnosis not present

## 2016-01-27 DIAGNOSIS — E1122 Type 2 diabetes mellitus with diabetic chronic kidney disease: Secondary | ICD-10-CM | POA: Diagnosis not present

## 2016-01-27 DIAGNOSIS — N184 Chronic kidney disease, stage 4 (severe): Secondary | ICD-10-CM | POA: Diagnosis not present

## 2016-01-27 DIAGNOSIS — I129 Hypertensive chronic kidney disease with stage 1 through stage 4 chronic kidney disease, or unspecified chronic kidney disease: Secondary | ICD-10-CM | POA: Diagnosis not present

## 2016-01-27 DIAGNOSIS — I5033 Acute on chronic diastolic (congestive) heart failure: Secondary | ICD-10-CM | POA: Diagnosis not present

## 2016-01-27 DIAGNOSIS — J449 Chronic obstructive pulmonary disease, unspecified: Secondary | ICD-10-CM | POA: Diagnosis not present

## 2016-01-27 DIAGNOSIS — I2510 Atherosclerotic heart disease of native coronary artery without angina pectoris: Secondary | ICD-10-CM | POA: Diagnosis not present

## 2016-01-28 ENCOUNTER — Telehealth: Payer: Self-pay | Admitting: *Deleted

## 2016-01-28 ENCOUNTER — Ambulatory Visit (INDEPENDENT_AMBULATORY_CARE_PROVIDER_SITE_OTHER): Payer: Medicare Other | Admitting: Nurse Practitioner

## 2016-01-28 VITALS — BP 142/74 | HR 61 | Temp 97.6°F | Resp 20 | Ht 62.0 in | Wt 230.4 lb

## 2016-01-28 DIAGNOSIS — I5032 Chronic diastolic (congestive) heart failure: Secondary | ICD-10-CM

## 2016-01-28 DIAGNOSIS — I10 Essential (primary) hypertension: Secondary | ICD-10-CM

## 2016-01-28 DIAGNOSIS — E1121 Type 2 diabetes mellitus with diabetic nephropathy: Secondary | ICD-10-CM | POA: Diagnosis not present

## 2016-01-28 DIAGNOSIS — J410 Simple chronic bronchitis: Secondary | ICD-10-CM

## 2016-01-28 DIAGNOSIS — N183 Chronic kidney disease, stage 3 unspecified: Secondary | ICD-10-CM

## 2016-01-28 DIAGNOSIS — M545 Low back pain: Secondary | ICD-10-CM | POA: Diagnosis not present

## 2016-01-28 DIAGNOSIS — E1122 Type 2 diabetes mellitus with diabetic chronic kidney disease: Secondary | ICD-10-CM

## 2016-01-28 DIAGNOSIS — G8929 Other chronic pain: Secondary | ICD-10-CM | POA: Diagnosis not present

## 2016-01-28 DIAGNOSIS — Z794 Long term (current) use of insulin: Secondary | ICD-10-CM

## 2016-01-28 MED ORDER — HYDROCODONE-ACETAMINOPHEN 10-325 MG PO TABS: 0.5000 | ORAL_TABLET | Freq: Three times a day (TID) | ORAL | Status: DC

## 2016-01-28 MED ORDER — HYDROCODONE BITARTRATE ER 40 MG PO T24A: 40.0000 mg | EXTENDED_RELEASE_TABLET | Freq: Every day | ORAL | Status: DC

## 2016-01-28 NOTE — Patient Instructions (Addendum)
To start hysingla ER 40 mg DAILY -- this is LONG acting hydrocodone  Once you start this stop hydrocodone/apap and take 1/2 tablet ONLY for severe breakthrough pain  Needs visit in 4 weeks with Dr Eulas Post for follow up on pain management   Increase insulin to 40 units in the morning and 25** units at dinner   Cont to take blood sugars  Follow up with [facility] in 4 weeks for diabetic management.

## 2016-01-28 NOTE — Telephone Encounter (Signed)
Patient called and stated that the pain medication prescribed, Hysingla costs $530.00 and she cannot afford this. Would like something different.  Please Advise.

## 2016-01-28 NOTE — Progress Notes (Signed)
Patient ID: Michelle Buck, female   DOB: November 03, 1936, 80 y.o.   MRN: [id number]    PCP: [name], NP  Advanced Directive information    Allergies  Allergen Reactions  . Tramadol Other (See Comments)    Leg cramps   . Codeine Nausea And Vomiting    Patient states N/V with codeine  . Hydrocodone Nausea And Vomiting  . Oxycodone Other (See Comments)    Patient states she can tolerate oxycodone  . Tylenol [Acetaminophen] Hives  . Penicillins Rash    Patient states rash/itch with penicillin Has patient had a PCN reaction causing immediate rash, facial/tongue/throat swelling, SOB or lightheadedness with hypotension: Yes- broke me out and i was itching Has patient had a PCN reaction causing severe rash involving mucus membranes or skin necrosis: No Has patient had a PCN reaction that required hospitalization Yes- i was already in the hospital Has patient had a PCN reaction occurring within the last 10 years: No If all of the above answers are "NO    Chief Complaint  Patient presents with  . Medical Management of Chronic Issues     HPI: Patient is a 80 y.o. female seen in the office today to follow up on chronic conditions. Pt with hx of CHF, COPD, DM, OA, chronic pain.  Recently with multiple hospitalization. Followed with [facility] pharm D and nursing.  Following with nursing due to CHF. Weights have been stable, some variation noted. No shortness of breath, chest discomfort, cough or congestion. No increase in LE edema.  recommendations made by pharm D to stop cilostazol due to CHF- agreed with this and it was stopped- no increase in symptoms noted.  Esomeprazole was flagged due to avoiding in the elderly however has been attempted off medication without success due to increase GERD.  Pt was also on furosemide and torsemide. furosemide was stopped. No increase in shortness of breath or chest pain.  No increase in swelling. Working well with therapy.  Increase endurance.   Blood  sugars remain elevated. Fasting 146-246.  Increase pain to left hip- does not wish to go to another orthopedic because she is not going to have surgery. Taking hydrocodone for this. Takes pain medication all the time. Pain is not well managed on current regimen. Hurts also in her low back, legs.  Currently taking hydrocodone 10/325 three times daily and still in pain. Takes all that is prescribed. Does not have side effects from medication.   Review of Systems:  Review of Systems  Constitutional: Negative for fever, chills, diaphoresis, appetite change and unexpected weight change.  HENT: Negative for congestion, ear discharge and ear pain.   Eyes: Negative for visual disturbance.  Respiratory: Negative for cough, shortness of breath and wheezing.   Cardiovascular: Negative for chest pain, palpitations and leg swelling.  Gastrointestinal: Negative for constipation.  Genitourinary: Negative for dysuria.  Musculoskeletal: Positive for back pain, arthralgias and neck pain.  Skin: Negative for color change, rash and wound.  Neurological: Positive for numbness (neuropathy to hands and legs). Negative for dizziness, facial asymmetry, light-headedness and headaches.  Hematological: Negative for adenopathy.  Psychiatric/Behavioral: Negative for behavioral problems, dysphoric mood and agitation.    Past Medical History  Diagnosis Date  . Diabetes mellitus   . Hypertension   . Obese   . Hiatal hernia   . Scoliosis   . Arthritis   . Acute bronchitis   . COPD (chronic obstructive pulmonary disease) ([place])   . Unspecified hereditary and idiopathic peripheral neuropathy   .  Secondary diabetes mellitus with renal manifestations, not stated as uncontrolled, or unspecified   . Chronic kidney disease, stage II (mild)   . Chronic kidney disease, stage II (mild)   . Chronic kidney disease, unspecified ([place])   . Disorder of bone and cartilage, unspecified   . Complications affecting other specified  body systems, hypertension   . Hypopotassemia   . Anxiety   . Congestive heart failure, unspecified   . GERD (gastroesophageal reflux disease)   . Lumbago   . Insomnia, unspecified   . Hyperlipidemia   . Unspecified glaucoma   . Diaphragmatic hernia without mention of obstruction or gangrene   . Osteoarthrosis, unspecified whether generalized or localized, unspecified site   . Degenerative arthritis of knee, bilateral   . PONV (postoperative nausea and vomiting)    Past Surgical History  Procedure Laterality Date  . Abdominal hysterectomy    . Shoulder surgery    . Total hip arthroplasty      bilateral  . Knee arthroscopy      bilateral  . Colonoscopy  08/06/2011  . Foot surgery     Social History:   reports that she quit smoking about 18 years ago. Her smoking use included Cigarettes. She has never used smokeless tobacco. She reports that she does not drink alcohol or use illicit drugs.  Family History  Problem Relation Age of Onset  . Diabetes Mother   . Heart disease Mother   . Heart disease Brother   . Diabetes Brother   . Kidney disease Father   . Scoliosis Sister   . Diabetes Brother   . Diabetes Brother   . Heart disease Brother   . Scoliosis Brother   . Stroke Brother   . Heart attack Brother     Medications: Patient's Medications  New Prescriptions   No medications on file  Previous Medications   AMITIZA 24 MCG CAPSULE    TAKE 1 CAPSULE BY MOUTH 2 TIMES A DAY WITH MEALS   ASPIRIN EC 81 MG TABLET    Take 81 mg by mouth daily.   BUDESONIDE (PULMICORT) 0.25 MG/2ML NEBULIZER SOLUTION    Take one ampule in Neb twice daily routinely   CHOLECALCIFEROL (VITAMIN D3) 2000 UNITS TABS    Take 2,000 Units by mouth daily.   CLONIDINE (CATAPRES) 0.1 MG TABLET    TAKE 1 TABLET BY MOUTH EVERY DAY   CYCLOSPORINE (RESTASIS) 0.05 % OPHTHALMIC EMULSION    Place 1 drop into both eyes 2 (two) times daily. One drop once a day bilaterally for dry eyes   DICLOFENAC SODIUM  (VOLTAREN) 1 % GEL    APPLY 4 GRAM TOPICALLY TWICE DAILY as needed for pain   DULOXETINE (CYMBALTA) 30 MG CAPSULE    TAKE 1 CAPSULE BY MOUTH EVERY DAY TO HELP ANXIETY AND PAINS   EASY TOUCH INSULIN SYRINGE 31G X 5/16" 0.5 ML MISC    USE UP TO 2 TIMES A DAY AS DIRECTED   ENALAPRIL (VASOTEC) 10 MG TABLET    TAKE 1 TABLET BY MOUTH ONCE DAILY FOR BLOOD PRESSURE AND STRENGTHEN HEART   GABAPENTIN (NEURONTIN) 100 MG CAPSULE    TAKE 2 CAPSULES BY MOUTH THREE TIMES A DAY   GUAIFENESIN (ROBITUSSIN) 100 MG/5ML SYRUP    Take 200 mg by mouth 3 (three) times daily as needed for cough.   HYDRALAZINE (APRESOLINE) 50 MG TABLET    TAKE 1 TABLET BY MOUTH TWICE DAILY   HYDROCODONE-ACETAMINOPHEN (NORCO) 10-325 MG TABLET    Take  1 tablet by mouth every 6 (six) hours as needed for moderate pain.   INSULIN NPH-REGULAR HUMAN (HUMULIN 70/30) (70-30) 100 UNIT/ML INJECTION    Inject 40 units subcutaneously with breakfast and 20 units with dinner to control blood sugar.   IPRATROPIUM-ALBUTEROL (DUONEB) 0.5-2.5 (3) MG/3ML SOLN    Take 62mls by nebulization every 6 hours as needed for SOB/Wheezing   LATANOPROST (XALATAN) 0.005 % OPHTHALMIC SOLUTION    Place 1 drop into both eyes at bedtime.   MAGNESIUM OXIDE (MAG-OX) 400 MG TABLET    Take 1 tablet (400 mg total) by mouth 2 (two) times daily.   METOPROLOL SUCCINATE (TOPROL-XL) 25 MG 24 HR TABLET    TAKE 1 TABLET BY MOUTH EVERY DAY WITH OR IMMEDIATELY FOLLOWING A MEAL FOR BLOOD PRESSURE   NEXIUM 40 MG CAPSULE    TAKE 1 CAPSULE BY MOUTH ONCE DAILY   POLYETHYLENE GLYCOL (MIRALAX / GLYCOLAX) PACKET    DISSOLVE 17 GRAMS (1 PACKET) INTO 8 OUNCES OF LIQUID AND DRINK EVERY DAY   POTASSIUM CHLORIDE SA (K-DUR,KLOR-CON) 20 MEQ TABLET    TAKE 1 TABLET BY MOUTH EVERY DAY   SIMVASTATIN (ZOCOR) 20 MG TABLET    TAKE 1 TABLET BY MOUTH EVERY DAY   SUCRALFATE (CARAFATE) 1 G TABLET    TAKE 1 TABLET BY MOUTH TWICE DAILY ON EMPTY STOMACH   TIMOLOL (TIMOPTIC) 0.5 % OPHTHALMIC SOLUTION    Place 1 drop  into both eyes 2 (two) times daily.    TORSEMIDE (DEMADEX) 20 MG TABLET    Take 1 tablet by mouth daily for edema.   VENTOLIN HFA 108 (90 BASE) MCG/ACT INHALER    INHALE 1 PUFF 4 TIMES A DAY FOR 5 DAYS, THEN USE EVERY 6 HOURS AS NEEDED  Modified Medications   No medications on file  Discontinued Medications   CLONIDINE (CATAPRES) 0.1 MG TABLET    Take 0.1 mg by mouth daily.   TORSEMIDE (DEMADEX) 20 MG TABLET    Take 20 mg by mouth daily.   TRAVOPROST, BAK FREE, (TRAVATAN) 0.004 % SOLN OPHTHALMIC SOLUTION    Place 1 drop into both eyes at bedtime. One drop bilaterally twice a day     Physical Exam:  Filed Vitals:   01/28/16 0937  BP: 142/74  Pulse: 61  Temp: 97.6 F (36.4 C)  TempSrc: Oral  Resp: 20  Height: 5\' 2"  (1.575 m)  Weight: 230 lb 6.4 oz (104.509 kg)  SpO2: 94%   Body mass index is 42.13 kg/(m^2).  Physical Exam  Constitutional: She is oriented to person, place, and time. She appears well-developed and well-nourished. No distress.  Obese AA female, in NAD  HENT:  Head: Normocephalic and atraumatic.  Neck: Normal range of motion. Neck supple.  Cardiovascular: Normal rate, regular rhythm and normal heart sounds.   Pulmonary/Chest: Effort normal and breath sounds normal. No respiratory distress. She has no wheezes.  Abdominal: Soft. Bowel sounds are normal.  Musculoskeletal: She exhibits edema (1+ bilaterally) and tenderness.  Uses rolling walker   Lymphadenopathy:    She has no cervical adenopathy.  Neurological: She is alert and oriented to person, place, and time.  Skin: Skin is warm and dry. She is not diaphoretic.  Psychiatric: She has a normal mood and affect.    Labs reviewed: Basic Metabolic Panel:  Recent Labs  10/29/15 1612  12/05/15 0247  12/08/15 1433  01/01/16 1246 01/02/16 0146 01/03/16 0511 01/07/16 1643  NA  --   < >  --   < >  --   < >  --  142 141 139  K  --   < >  --   < >  --   < >  --  3.4* 3.9 5.2  CL  --   < >  --   < >  --   < >   --  102 102 95*  CO2  --   < >  --   < >  --   < >  --  29 29 27   GLUCOSE  --   < >  --   < >  --   < >  --  139* 81 112*  BUN  --   < >  --   < >  --   < >  --  26* 20 21  CREATININE  --   < >  --   < >  --   < > 1.83* 1.90* 1.87* 1.77*  CALCIUM  --   < >  --   < >  --   < >  --  9.1 8.9 9.3  MG 1.5*  --  1.9  --   --   --  2.3  --   --   --   TSH  --   --   --   --  2.584  --  2.213  --   --   --   < > = values in this interval not displayed. Liver Function Tests:  Recent Labs  12/28/15 1424 01/01/16 1022 01/03/16 0511  AST 17 15 19   ALT 9* 10* 8*  ALKPHOS 105 110 99  BILITOT 0.2* 0.3 0.5  PROT 7.4 7.3 6.9  ALBUMIN 3.6 3.7 3.4*   No results for input(s): LIPASE, AMYLASE in the last 8760 hours. No results for input(s): AMMONIA in the last 8760 hours. CBC:  Recent Labs  12/28/15 1424 01/01/16 1022 01/01/16 1246 01/03/16 0511 01/07/16 1643  WBC 7.6 8.0 7.1 6.8 7.5  NEUTROABS 4.5 4.4  --   --  4.0  HGB 10.6* 10.7* 10.3* 10.4*  --   HCT 33.6* 34.1* 33.1* 33.5* 32.8*  MCV 78.1 78.6 78.8 78.3 76*  PLT 268 285 256 278 321   Lipid Panel:  Recent Labs  02/19/15 1057 03/14/15 0806  CHOL 174 147  HDL 44 41  LDLCALC 75 62  TRIG 275* 219*  CHOLHDL 4.0 3.6   TSH:  Recent Labs  12/08/15 1433 01/01/16 1246  TSH 2.584 2.213   A1C: Lab Results  Component Value Date   HGBA1C 8.0* 01/01/2016     Assessment/Plan 1. Chronic midline low back pain without sciatica -worsening pain to back, hip and legs.  - does not with to follow up with orthopedic.  Current RX for hydrocodone-apap 10-325 mg q 6 hours but taking 3 times daily. -reports around the clock pain, will start Hys[place] 40 mg once daily and to STOP hydrocodone until she is having severe breakthrough pain.  - HYDROcodone-acetaminophen (NORCO) 10-325 MG tablet; Take 0.5 tablets by mouth every 8 (eight) hours as needed for SEVERE pain Dispense: 30 tablet; Refill: 0  2. Type 2 diabetes mellitus with diabetic  nephropathy, with long-term current use of insulin (HCC) -blood sugars not well controlled. Will increase dinner time dose of humulin 70/30 to 25 units at this time. To cont 40 units at breakfast. Pt to follow up with [facility] pharm D in 4 weeks. To bring blood sugar log. Discussed diet.   3. Simple chronic bronchitis (HCC) COPD  remains stable, taking pulmicort twice daily as prescribed.   4. Chronic diastolic congestive heart failure (HCC) Appears euvolemic, following [facility] for ongoing management. Weights stable, conts on demadex, Toprol and vasotec  5. Essential hypertension, benign Blood pressure stable, conts on vasotec, clonidine, hydralazine and Toprol XL   6. CKD stage 3 due to type 2 diabetes mellitus ([place]) BUN/Cr stable on recent labs, will cont to monitor   Shewanda Sharpe K. Harle Battiest  [facility] [phone] 8 am - 5 pm) [phone] (after hours)

## 2016-01-29 DIAGNOSIS — N184 Chronic kidney disease, stage 4 (severe): Secondary | ICD-10-CM | POA: Diagnosis not present

## 2016-01-29 DIAGNOSIS — I2510 Atherosclerotic heart disease of native coronary artery without angina pectoris: Secondary | ICD-10-CM | POA: Diagnosis not present

## 2016-01-29 DIAGNOSIS — I129 Hypertensive chronic kidney disease with stage 1 through stage 4 chronic kidney disease, or unspecified chronic kidney disease: Secondary | ICD-10-CM | POA: Diagnosis not present

## 2016-01-29 DIAGNOSIS — E1122 Type 2 diabetes mellitus with diabetic chronic kidney disease: Secondary | ICD-10-CM | POA: Diagnosis not present

## 2016-01-29 DIAGNOSIS — J449 Chronic obstructive pulmonary disease, unspecified: Secondary | ICD-10-CM | POA: Diagnosis not present

## 2016-01-29 DIAGNOSIS — I5033 Acute on chronic diastolic (congestive) heart failure: Secondary | ICD-10-CM | POA: Diagnosis not present

## 2016-01-30 NOTE — Telephone Encounter (Signed)
LMOM to return call.

## 2016-01-30 NOTE — Telephone Encounter (Signed)
Okay DC off list and lets try fentanyl patch 25 mcg to apply patch to skin and change every 3 days, remove old patch when applying new one. To keep follow up appt to follow up new medication. Pt has multiple intolerances to pain medications make sure she has not tired this medication in the past

## 2016-01-31 DIAGNOSIS — J449 Chronic obstructive pulmonary disease, unspecified: Secondary | ICD-10-CM | POA: Diagnosis not present

## 2016-01-31 DIAGNOSIS — I2510 Atherosclerotic heart disease of native coronary artery without angina pectoris: Secondary | ICD-10-CM | POA: Diagnosis not present

## 2016-01-31 DIAGNOSIS — I5033 Acute on chronic diastolic (congestive) heart failure: Secondary | ICD-10-CM | POA: Diagnosis not present

## 2016-01-31 DIAGNOSIS — N184 Chronic kidney disease, stage 4 (severe): Secondary | ICD-10-CM | POA: Diagnosis not present

## 2016-01-31 DIAGNOSIS — E1122 Type 2 diabetes mellitus with diabetic chronic kidney disease: Secondary | ICD-10-CM | POA: Diagnosis not present

## 2016-01-31 DIAGNOSIS — I129 Hypertensive chronic kidney disease with stage 1 through stage 4 chronic kidney disease, or unspecified chronic kidney disease: Secondary | ICD-10-CM | POA: Diagnosis not present

## 2016-01-31 MED ORDER — FENTANYL 25 MCG/HR TD PT72: 25.0000 ug | MEDICATED_PATCH | TRANSDERMAL | Status: DC

## 2016-01-31 NOTE — Telephone Encounter (Signed)
Patient notified and Rx printed. Medication list updated. [place] to pick up Rx.

## 2016-02-03 ENCOUNTER — Other Ambulatory Visit: Payer: Self-pay | Admitting: Nurse Practitioner

## 2016-02-05 ENCOUNTER — Other Ambulatory Visit: Payer: Self-pay | Admitting: *Deleted

## 2016-02-05 DIAGNOSIS — I129 Hypertensive chronic kidney disease with stage 1 through stage 4 chronic kidney disease, or unspecified chronic kidney disease: Secondary | ICD-10-CM | POA: Diagnosis not present

## 2016-02-05 DIAGNOSIS — E1122 Type 2 diabetes mellitus with diabetic chronic kidney disease: Secondary | ICD-10-CM | POA: Diagnosis not present

## 2016-02-05 DIAGNOSIS — J449 Chronic obstructive pulmonary disease, unspecified: Secondary | ICD-10-CM | POA: Diagnosis not present

## 2016-02-05 DIAGNOSIS — I5033 Acute on chronic diastolic (congestive) heart failure: Secondary | ICD-10-CM | POA: Diagnosis not present

## 2016-02-05 DIAGNOSIS — N184 Chronic kidney disease, stage 4 (severe): Secondary | ICD-10-CM | POA: Diagnosis not present

## 2016-02-05 DIAGNOSIS — I2510 Atherosclerotic heart disease of native coronary artery without angina pectoris: Secondary | ICD-10-CM | POA: Diagnosis not present

## 2016-02-05 NOTE — Patient Outreach (Signed)
[facility]) Care Management   02/05/2016  Michelle Buck 24-Jan-1936 [id number]   Michelle Buck is an 80 y.o. female  Subjective:  HF: Pt reports she has tried to weigh everyday and document all her weights and reported today at 228 lbs, yesterday at 226 lbs and last week at 227 lbs. Pt denies any SOB or swelling otday however has a history of swelling. Pt does not like the compression stockings and can usually reduce her swelling with elevating her lower legs. Pt continued to need review HF symptoms however able to recognize swelling and aware of when to consult her provider related to swelling.  DM: Pt reports she is managing her diabetes with good reading with an morning read of 118 (controlled). APPOINTMENTS: pt reports she has attending all medical appointments with no delays. Pt reports all other pending appointments and states she has transportation to all her appointments. NUTRITION: Pt reports she tries to eat healthy with a low sodium diet however continues to need portion size control.  EDEMA: Pt reports history of swelling to her lower legs however improved from last home visit. Pt states she usually elevates her legs to reduce her swelling with good results but can not wear the compression stockings.  Objective:   Review of Systems  Constitutional: Negative.   HENT: Negative.   Eyes: Negative.   Respiratory: Negative.   Cardiovascular: Negative.   Gastrointestinal: Negative.   Genitourinary: Negative.   Musculoskeletal: Negative.   Skin: Negative.   Neurological: Negative.   Endo/Heme/Allergies: Negative.   Psychiatric/Behavioral: Negative.     Physical Exam  Constitutional: She is oriented to person, place, and time. She appears well-developed and well-nourished.  HENT:  Right Ear: External ear normal.  Left Ear: External ear normal.  Eyes: EOM are normal.  Neck: Normal range of motion.  Cardiovascular: Normal heart sounds.   Respiratory: Effort  normal and breath sounds normal.  GI: Soft. Bowel sounds are normal.  Musculoskeletal: Normal range of motion.  Neurological: She is alert and oriented to person, place, and time.  Skin: Skin is warm and dry.  Psychiatric: She has a normal mood and affect. Her behavior is normal. Judgment and thought content normal.    Current Medications:   Current Outpatient Prescriptions  Medication Sig Dispense Refill  . AMITIZA 24 MCG capsule TAKE 1 CAPSULE BY MOUTH 2 TIMES A DAY WITH MEALS (Patient taking differently: TAKE 24 MCG BY MOUTH 2 TIMES A DAY WITH MEALS) 60 capsule 5  . aspirin EC 81 MG tablet Take 81 mg by mouth daily.    . budesonide (PULMICORT) 0.25 MG/2ML nebulizer solution Take one ampule in Neb twice daily routinely 60 mL 1  . Cholecalciferol (VITAMIN D3) 2000 units TABS Take 2,000 Units by mouth daily.    . cloNIDine (CATAPRES) 0.1 MG tablet TAKE 1 TABLET BY MOUTH EVERY DAY 30 tablet 4  . cycloSPORINE (RESTASIS) 0.05 % ophthalmic emulsion Place 1 drop into both eyes 2 (two) times daily. One drop once a day bilaterally for dry eyes    . diclofenac sodium (VOLTAREN) 1 % GEL APPLY 4 GRAM TOPICALLY TWICE DAILY as needed for pain 100 g 6  . DULoxetine (CYMBALTA) 30 MG capsule TAKE 1 CAPSULE BY MOUTH EVERY DAY TO HELP ANXIETY AND PAINS (Patient taking differently: TAKE 30 MG BY MOUTH EVERY DAY TO HELP ANXIETY AND PAINS) 30 capsule 5  . EASY TOUCH INSULIN SYRINGE 31G X 5/16" 0.5 ML MISC USE UP TO 2 TIMES A  DAY AS DIRECTED 100 each 2  . enalapril (VASOTEC) 10 MG tablet TAKE 1 TABLET BY MOUTH ONCE DAILY FOR BLOOD PRESSURE AND STRENGTHEN HEART 30 tablet 3  . fentaNYL (DURAGESIC) 25 MCG/HR patch Place 1 patch (25 mcg total) onto the skin every 3 (three) days. For pain. Remove old patch. 10 patch 0  . gabapentin (NEURONTIN) 100 MG capsule TAKE 2 CAPSULES BY MOUTH THREE TIMES A DAY (Patient taking differently: TAKE 200 MG BY MOUTH THREE TIMES A DAY) 180 capsule 5  . guaifenesin (ROBITUSSIN) 100  MG/5ML syrup Take 200 mg by mouth 3 (three) times daily as needed for cough.    . hydrALAZINE (APRESOLINE) 50 MG tablet TAKE 1 TABLET BY MOUTH TWICE DAILY (Patient taking differently: TAKE 50 MG BY MOUTH TWICE DAILY) 60 tablet 5  . HYDROcodone-acetaminophen (NORCO) 10-325 MG tablet Take 0.5 tablets by mouth every 8 (eight) hours. 30 tablet 0  . insulin NPH-regular Human (HUMULIN 70/30) (70-30) 100 UNIT/ML injection Inject 40 units subcutaneously with breakfast and 25 units with dinner to control blood sugar.    . ipratropium-albuterol (DUONEB) 0.5-2.5 (3) MG/3ML SOLN Take 3mls by nebulization every 6 hours as needed for SOB/Wheezing 360 mL 3  . latanoprost (XALATAN) 0.005 % ophthalmic solution Place 1 drop into both eyes at bedtime.    . magnesium oxide (MAG-OX) 400 MG tablet Take 1 tablet (400 mg total) by mouth 2 (two) times daily. 60 tablet 2  . metoprolol succinate (TOPROL-XL) 25 MG 24 hr tablet TAKE 1 TABLET BY MOUTH EVERY DAY WITH OR IMMEDIATELY FOLLOWING A MEAL FOR BLOOD PRESSURE 30 tablet 3  . NEXIUM 40 MG capsule TAKE 1 CAPSULE BY MOUTH ONCE DAILY (Patient taking differently: TAKE 40 MG BY MOUTH ONCE DAILY) 30 capsule 5  . polyethylene glycol (MIRALAX / GLYCOLAX) packet DISSOLVE 17 GRAMS (1 PACKET) INTO 8 OUNCES OF LIQUID AND DRINK EVERY DAY 30 each 2  . potassium chloride SA (K-DUR,KLOR-CON) 20 MEQ tablet TAKE 1 TABLET BY MOUTH EVERY DAY (Patient taking differently: TAKE 20 MEQ BY MOUTH EVERY DAY) 30 tablet 5  . simvastatin (ZOCOR) 20 MG tablet TAKE 1 TABLET BY MOUTH EVERY DAY 30 tablet 3  . sucralfate (CARAFATE) 1 g tablet TAKE 1 TABLET BY MOUTH TWICE DAILY ON EMPTY STOMACH (Patient taking differently: TAKE 1 G BY MOUTH TWICE DAILY ON EMPTY STOMACH) 60 tablet 2  . timolol (TIMOPTIC) 0.5 % ophthalmic solution Place 1 drop into both eyes 2 (two) times daily.     Marland Kitchen torsemide (DEMADEX) 20 MG tablet Take 1 tablet by mouth daily for edema. 90 tablet 2  . VENTOLIN HFA 108 (90 Base) MCG/ACT inhaler  INHALE 1 PUFF 4 TIMES A DAY FOR 5 DAYS, THEN USE EVERY 6 HOURS AS NEEDED (Patient taking differently: INHALE 1 PUFF 4 TIMES A DAY FOR 5 DAYS, THEN USE EVERY 6 HOURS AS NEEDED for shortness of breath/wheezing) 18 Inhaler 5   No current facility-administered medications for this visit.    Functional Status:   In your present state of health, do you have any difficulty performing the following activities: 01/01/2016 12/31/2015  Hearing? Y (No Data)  Vision? N N  Difficulty concentrating or making decisions? N N  Walking or climbing stairs? Y Y  Dressing or bathing? N -  Doing errands, shopping? Tempie Donning    Fall/Depression Screening:    PHQ 2/9 Scores 01/08/2016 12/31/2015 12/10/2015 05/30/2015 10/10/2014 05/23/2013  PHQ - 2 Score 0 0 0 0 0 0   Filed  Vitals:   02/05/16 1514  BP: 120/62  Pulse: 62  Resp: 20    Assessment:   Ongoing case management related to HF Follow up on schedule medical appointments Nutritional education related to salt smart diet History of bilateral edema  Plan:  Will completed a physical assessment and inquire on pt's ongoing management of care. Will verify pt's documentation related to documenting all weights in monitorng for fluid retention. Will review HF symptoms as pt need review on when to contact her provider with 3 lbs over night or 5 lbs within one week. Will continue to encouraged ongoing management of care and encouraged pt to document all weights for providers to view. Will encouraged pt to review the provided HF information to prevent acute symptoms from occurring.  Will verify pt has attended all medical appointments and has sufficient transportation with no delays. Will continue to encouraged adherence. Will review pt's dietary habits and strongly encourage pt on portions size. Offered educational information on portion sized for pt's viewing however pt opt to received an oral discussed as discribe by this RN. Verified pt is aware food items that affect her HF  and attempts to avoid. Will continue to encourage a salt smart diet to avoid fluid retention. Will verified any ongoing swelling and continue to encouraged pt to elevate her lower legs to prevent swelling and fluid retention. Compressions were discussed however pt adamant about not wearing stockings or stocks to her lower legs and feet. Will continue to encourage pt to elevate her lower legs to prevent or reduce any ongoing swelling.  Plan of care discussed and adjustment to goals to allow pt's adherence. Will re-evaluate on next home visit. Will schedule next home visit and follow up accordingly.  [name], RN Care Management Coordinator [place] Office [phone]

## 2016-02-06 ENCOUNTER — Encounter: Payer: Self-pay | Admitting: Internal Medicine

## 2016-02-07 ENCOUNTER — Other Ambulatory Visit: Payer: Self-pay

## 2016-02-07 DIAGNOSIS — G8929 Other chronic pain: Secondary | ICD-10-CM

## 2016-02-07 DIAGNOSIS — M545 Low back pain: Principal | ICD-10-CM

## 2016-02-07 MED ORDER — HYDROCODONE-ACETAMINOPHEN 10-325 MG PO TABS: 0.5000 | ORAL_TABLET | Freq: Three times a day (TID) | ORAL | Status: DC

## 2016-02-11 ENCOUNTER — Other Ambulatory Visit: Payer: Self-pay | Admitting: Nurse Practitioner

## 2016-02-11 ENCOUNTER — Telehealth: Payer: Self-pay

## 2016-02-11 NOTE — Telephone Encounter (Signed)
Paperwork received from [facility] for Shoulder elbow wrist hand orthosis.  I called patient to confirm that she requested this equipment. Patient confirmed that she did request due to ongoing shoulder pain. Paperwork placed in file folder for pending appointment on 02-26-16 with Dr.Carter

## 2016-02-12 ENCOUNTER — Other Ambulatory Visit: Payer: Self-pay | Admitting: Nurse Practitioner

## 2016-02-12 DIAGNOSIS — I2510 Atherosclerotic heart disease of native coronary artery without angina pectoris: Secondary | ICD-10-CM | POA: Diagnosis not present

## 2016-02-12 DIAGNOSIS — E1122 Type 2 diabetes mellitus with diabetic chronic kidney disease: Secondary | ICD-10-CM | POA: Diagnosis not present

## 2016-02-12 DIAGNOSIS — N184 Chronic kidney disease, stage 4 (severe): Secondary | ICD-10-CM | POA: Diagnosis not present

## 2016-02-12 DIAGNOSIS — I5033 Acute on chronic diastolic (congestive) heart failure: Secondary | ICD-10-CM | POA: Diagnosis not present

## 2016-02-12 DIAGNOSIS — J449 Chronic obstructive pulmonary disease, unspecified: Secondary | ICD-10-CM | POA: Diagnosis not present

## 2016-02-12 DIAGNOSIS — I129 Hypertensive chronic kidney disease with stage 1 through stage 4 chronic kidney disease, or unspecified chronic kidney disease: Secondary | ICD-10-CM | POA: Diagnosis not present

## 2016-02-17 ENCOUNTER — Ambulatory Visit: Payer: Self-pay | Admitting: Pharmacotherapy

## 2016-02-19 DIAGNOSIS — I2510 Atherosclerotic heart disease of native coronary artery without angina pectoris: Secondary | ICD-10-CM | POA: Diagnosis not present

## 2016-02-19 DIAGNOSIS — J449 Chronic obstructive pulmonary disease, unspecified: Secondary | ICD-10-CM | POA: Diagnosis not present

## 2016-02-19 DIAGNOSIS — I5033 Acute on chronic diastolic (congestive) heart failure: Secondary | ICD-10-CM | POA: Diagnosis not present

## 2016-02-19 DIAGNOSIS — N184 Chronic kidney disease, stage 4 (severe): Secondary | ICD-10-CM | POA: Diagnosis not present

## 2016-02-19 DIAGNOSIS — I129 Hypertensive chronic kidney disease with stage 1 through stage 4 chronic kidney disease, or unspecified chronic kidney disease: Secondary | ICD-10-CM | POA: Diagnosis not present

## 2016-02-19 DIAGNOSIS — E1122 Type 2 diabetes mellitus with diabetic chronic kidney disease: Secondary | ICD-10-CM | POA: Diagnosis not present

## 2016-02-24 ENCOUNTER — Encounter: Payer: Self-pay | Admitting: Pharmacotherapy

## 2016-02-24 ENCOUNTER — Ambulatory Visit (INDEPENDENT_AMBULATORY_CARE_PROVIDER_SITE_OTHER): Payer: Medicare Other | Admitting: Pharmacotherapy

## 2016-02-24 VITALS — BP 116/66 | HR 58 | Temp 98.1°F | Resp 20 | Ht 62.0 in | Wt 225.8 lb

## 2016-02-24 DIAGNOSIS — I10 Essential (primary) hypertension: Secondary | ICD-10-CM

## 2016-02-24 DIAGNOSIS — N183 Chronic kidney disease, stage 3 unspecified: Secondary | ICD-10-CM

## 2016-02-24 DIAGNOSIS — Z794 Long term (current) use of insulin: Secondary | ICD-10-CM

## 2016-02-24 DIAGNOSIS — E1121 Type 2 diabetes mellitus with diabetic nephropathy: Secondary | ICD-10-CM | POA: Diagnosis not present

## 2016-02-24 DIAGNOSIS — E1122 Type 2 diabetes mellitus with diabetic chronic kidney disease: Secondary | ICD-10-CM | POA: Diagnosis not present

## 2016-02-24 MED ORDER — FENTANYL 25 MCG/HR TD PT72: 25.0000 ug | MEDICATED_PATCH | TRANSDERMAL | Status: DC

## 2016-02-24 NOTE — Patient Instructions (Signed)
Start checking blood sugar twice a day.

## 2016-02-24 NOTE — Progress Notes (Signed)
Subjective:    Michelle Buck is a 80 y.o.African American female who presents for follow-up of Type 2 diabetes mellitus.   Most recent A1C was 8.0%.  Was 6.9%. Has been in hospital recently with cardiac issues. (acute on chronic CHF)  Only SMBG in the mornings.  Logbook shows BG:  90-177m/dl No hypoglycemia.  Continues to complain of DOE. Has peripheral edema. Pain in both feet - right >left. Complains of joint pain all over.  She ran out of her fentanyl patch this weekend.  She ran out of her hydrocodone/APAP.  Was taking a whole pill rather than the 1/2 pill prescribed. Needs a referral to another podiatrist.  She tends to overcorrect when she feels low. Making healthy food choices. Trying to do walking with the home health nurse and with her sister.  Uses a rolling/seated walker for assist.  Denies problems with vision.  Needs eye exam. Nocturia every hour.   Review of Systems A comprehensive review of systems was negative except for: Constitutional: positive for fatigue Eyes: positive for poor vision Cardiovascular: positive for dyspnea and lower extremity edema Genitourinary: positive for nocturia Musculoskeletal: positive for arthralgias and stiff joints    Objective:    BP 116/66 mmHg  Pulse 58  Temp(Src) 98.1 F (36.7 C) (Oral)  Resp 20  Ht 5' 2"$  (1.575 m)  Wt 225 lb 12.8 oz (102.422 kg)  BMI 41.29 kg/m2  SpO2 93%  General:  alert, cooperative and mild distress  Oropharynx: normal findings: lips normal without lesions and gums healthy   Eyes:  negative findings: lids and lashes normal and conjunctivae and sclerae normal   Ears:  external ears normal        Lung: clear to auscultation bilaterally  Heart:  regular rate and rhythm     Extremities: edema bilaeral and left foot is flat, no arch noted  Skin: dry     Neuro: mental status, speech normal, alert and oriented x3 and uses rolling / seated walker for assist with walking.   Lab Review GLUCOSE  (mg/dL)  Date Value  01/07/2016 112*  10/15/2015 148*  08/27/2015 121*   GLUCOSE, BLD (mg/dL)  Date Value  01/03/2016 81  01/02/2016 139*  01/01/2016 126*   CO2 (mmol/L)  Date Value  01/07/2016 27  01/03/2016 29  01/02/2016 29   BUN (mg/dL)  Date Value  01/07/2016 21  01/03/2016 20  01/02/2016 26*  01/01/2016 25*  10/15/2015 27  08/27/2015 43*   CREATININE, SER (mg/dL)  Date Value  01/07/2016 1.77*  01/03/2016 1.87*  01/02/2016 1.90*       Assessment:    Diabetes Mellitus type II, under fair control. Last A1C was above goal <7%. However, she was under physical stress and illness. BP at goal <140/90 Complaining of pain - out of her fentanyl patches. CKD stable   Plan:    1.  Rx changes: none   Her logbook indicates reasonable control of her diabetes at this time. 2.  Continue 70/30 insulin 40 units with breakfast, 25 units with supper. 3.  Asked her to start SMBG twice daily to better establish BG patterns. 4.  Counseled on foot care. 5.  Counseled on nutrition goals. 6.  Counseled on benefit of routine exercise.  Goal is 30-45 minutes 5 x week. 7.  BP at goal <140/90 8.  Chronic pain - in pain today.  Is out of her fentanyl patch.  Fentanyl 271m every 3 days RX given by Dr. ReMariea Clonts Explained appropriate  use of Hydrocodone for breakthrough pain. 9.  CKD - stable. 10.  RTC in 3 months.

## 2016-02-26 ENCOUNTER — Ambulatory Visit (INDEPENDENT_AMBULATORY_CARE_PROVIDER_SITE_OTHER): Payer: Medicare Other | Admitting: Internal Medicine

## 2016-02-26 ENCOUNTER — Encounter: Payer: Self-pay | Admitting: Internal Medicine

## 2016-02-26 VITALS — BP 138/70 | HR 72 | Temp 98.5°F | Resp 20 | Ht 62.0 in | Wt 221.8 lb

## 2016-02-26 DIAGNOSIS — M48061 Spinal stenosis, lumbar region without neurogenic claudication: Secondary | ICD-10-CM

## 2016-02-26 DIAGNOSIS — E1121 Type 2 diabetes mellitus with diabetic nephropathy: Secondary | ICD-10-CM | POA: Diagnosis not present

## 2016-02-26 DIAGNOSIS — J449 Chronic obstructive pulmonary disease, unspecified: Secondary | ICD-10-CM

## 2016-02-26 DIAGNOSIS — I10 Essential (primary) hypertension: Secondary | ICD-10-CM

## 2016-02-26 DIAGNOSIS — I5032 Chronic diastolic (congestive) heart failure: Secondary | ICD-10-CM

## 2016-02-26 DIAGNOSIS — G8929 Other chronic pain: Secondary | ICD-10-CM | POA: Diagnosis not present

## 2016-02-26 DIAGNOSIS — E1122 Type 2 diabetes mellitus with diabetic chronic kidney disease: Secondary | ICD-10-CM | POA: Diagnosis not present

## 2016-02-26 DIAGNOSIS — Z794 Long term (current) use of insulin: Secondary | ICD-10-CM | POA: Diagnosis not present

## 2016-02-26 DIAGNOSIS — R600 Localized edema: Secondary | ICD-10-CM

## 2016-02-26 DIAGNOSIS — E1165 Type 2 diabetes mellitus with hyperglycemia: Secondary | ICD-10-CM

## 2016-02-26 DIAGNOSIS — M4806 Spinal stenosis, lumbar region: Secondary | ICD-10-CM | POA: Diagnosis not present

## 2016-02-26 MED ORDER — FENTANYL 50 MCG/HR TD PT72: 50.0000 ug | MEDICATED_PATCH | TRANSDERMAL | Status: DC

## 2016-02-26 NOTE — Patient Instructions (Signed)
Increase fentanyl patch to 47mcg daily  Continue other medications as ordered  Follow up in 1 month for pain

## 2016-02-26 NOTE — Progress Notes (Signed)
Patient ID: Michelle Buck, female   DOB: 30-Nov-1936, 80 y.o.   MRN: [id number]    Location:    [facility]   Place of Service:   OFFICE  Chief Complaint  Patient presents with  . Medical Management of Chronic Issues    4 week follow-up for pain management and complete form from [facility], Labs printed    HPI:  80 yo female seen today for f/u  Multiple joint pain - she reports diffuse arthralgias but especially in legs b/l. Pain is shooting. She also c/o pain in left shoulder that began after fall in bathroom. She hit her shoulder against faucet and bruised it. She does not feel she needs orthotic and does not feel it will help. She takes norco, fentanyl patch and uses voltaren gel. Also takes cymbalta and gabapentin  HTN - BP stable on metoprolol, hydralazine, enalapril, clonidine  hyperlidemia - stable on simvastatin  Hx CHF - stable on statin, diuretic. Takes asa daily  COPD - stable on duoneb and pulmicort. Uses HFA prn  DM with neuropathy - A1c 8% . Insulin dependent (40 units in AM and 25 at PM). Takes gabapentin. BS 140-150s and occasionally in 200s. She has a low BS reaction at times. Followed by diabetic educator  Edema  - stable on demadex and lasix with Kdur  Glaucoma - uses eye gtts. Followed by Dr Katy Fitch  CKD/AOCD - stable  GERD/constipation - stable on nexium. She has emesis with amitiza and miralax  Past Medical History  Diagnosis Date  . Diabetes mellitus   . Hypertension   . Obese   . Hiatal hernia   . Scoliosis   . Arthritis   . Acute bronchitis   . COPD (chronic obstructive pulmonary disease) ([place])   . Unspecified hereditary and idiopathic peripheral neuropathy   . Secondary diabetes mellitus with renal manifestations, not stated as uncontrolled, or unspecified   . Chronic kidney disease, stage II (mild)   . Chronic kidney disease, stage II (mild)   . Chronic kidney disease, unspecified ([place])   . Disorder of bone and cartilage, unspecified   . Complications  affecting other specified body systems, hypertension   . Hypopotassemia   . Anxiety   . Congestive heart failure, unspecified   . GERD (gastroesophageal reflux disease)   . Lumbago   . Insomnia, unspecified   . Hyperlipidemia   . Unspecified glaucoma   . Diaphragmatic hernia without mention of obstruction or gangrene   . Osteoarthrosis, unspecified whether generalized or localized, unspecified site   . Degenerative arthritis of knee, bilateral   . PONV (postoperative nausea and vomiting)     Past Surgical History  Procedure Laterality Date  . Abdominal hysterectomy    . Shoulder surgery    . Total hip arthroplasty      bilateral  . Knee arthroscopy      bilateral  . Colonoscopy  08/06/2011  . Foot surgery      Patient Care Team: [name], NP as PCP - General (Nurse Practitioner) Tobi Bastos, RN as [place] Management Cyndia Skeeters, [facility] as [place] Management (Pharmacist)  Social History   Social History  . Marital Status: Widowed    Spouse Name: N/A  . Number of Children: N/A  . Years of Education: N/A   Occupational History  . Retired    Social History Main Topics  . Smoking status: Former Smoker    Types: Cigarettes    Quit date:  02/27/1997  . Smokeless tobacco: Never Used  . Alcohol Use: No  . Drug Use: No  . Sexual Activity: No   Other Topics Concern  . Not on file   Social History Narrative     reports that she quit smoking about 19 years ago. Her smoking use included Cigarettes. She has never used smokeless tobacco. She reports that she does not drink alcohol or use illicit drugs.  Allergies  Allergen Reactions  . Tramadol Other (See Comments)    Leg cramps   . Codeine Nausea And Vomiting    Patient states N/V with codeine  . Hydrocodone Nausea And Vomiting  . Oxycodone Other (See Comments)    Patient states she can tolerate oxycodone  . Tylenol [Acetaminophen] Hives  . Penicillins  Rash    Patient states rash/itch with penicillin Has patient had a PCN reaction causing immediate rash, facial/tongue/throat swelling, SOB or lightheadedness with hypotension: Yes- broke me out and i was itching Has patient had a PCN reaction causing severe rash involving mucus membranes or skin necrosis: No Has patient had a PCN reaction that required hospitalization Yes- i was already in the hospital Has patient had a PCN reaction occurring within the last 10 years: No If all of the above answers are "NO    Medications: Patient's Medications  New Prescriptions   No medications on file  Previous Medications   AMITIZA 24 MCG CAPSULE    TAKE 1 CAPSULE BY MOUTH 2 TIMES A DAY WITH MEALS   ASPIRIN EC 81 MG TABLET    Take 81 mg by mouth daily.   BUDESONIDE (PULMICORT) 0.25 MG/2ML NEBULIZER SOLUTION    Take one ampule in Neb twice daily routinely   CHOLECALCIFEROL (VITAMIN D3) 2000 UNITS TABS    Take 2,000 Units by mouth daily.   CLONIDINE (CATAPRES) 0.1 MG TABLET    TAKE 1 TABLET BY MOUTH EVERY DAY   CYCLOSPORINE (RESTASIS) 0.05 % OPHTHALMIC EMULSION    Place 1 drop into both eyes 2 (two) times daily. One drop once a day bilaterally for dry eyes   DICLOFENAC SODIUM (VOLTAREN) 1 % GEL    APPLY 4 GRAM TOPICALLY TWICE DAILY as needed for pain   DULOXETINE (CYMBALTA) 30 MG CAPSULE    TAKE 1 CAPSULE BY MOUTH EVERY DAY TO HELP ANXIETY AND PAINS   EASY TOUCH INSULIN SYRINGE 31G X 5/16" 0.5 ML MISC    USE UP TO 2 TIMES A DAY AS DIRECTED   ENALAPRIL (VASOTEC) 10 MG TABLET    TAKE 1 TABLET BY MOUTH ONCE DAILY FOR BLOOD PRESSURE AND STRENGTHEN HEART   FENTANYL (DURAGESIC) 25 MCG/HR PATCH    Place 1 patch (25 mcg total) onto the skin every 3 (three) days. For pain. Remove old patch.   FUROSEMIDE (LASIX) 40 MG TABLET    TAKE 1 TABLET BY MOUTH TWICE A DAY   GABAPENTIN (NEURONTIN) 100 MG CAPSULE    TAKE 2 CAPSULES BY MOUTH THREE TIMES A DAY   GUAIFENESIN (ROBITUSSIN) 100 MG/5ML SYRUP    Take 200 mg by mouth 3  (three) times daily as needed for cough.   HYDRALAZINE (APRESOLINE) 50 MG TABLET    TAKE 1 TABLET BY MOUTH TWICE DAILY   HYDROCODONE-ACETAMINOPHEN (NORCO) 10-325 MG TABLET    Take 0.5 tablets by mouth every 8 (eight) hours.   INSULIN NPH-REGULAR HUMAN (HUMULIN 70/30) (70-30) 100 UNIT/ML INJECTION    Inject 40 units subcutaneously with breakfast and 25 units with dinner to control blood  sugar.   IPRATROPIUM-ALBUTEROL (DUONEB) 0.5-2.5 (3) MG/3ML SOLN    Take by nebulization every 6 hours as needed for SOB/Wheezing   LATANOPROST (XALATAN) 0.005 % OPHTHALMIC SOLUTION    Place 1 drop into both eyes at bedtime.   MAGNESIUM OXIDE (MAG-OX) 400 MG TABLET    Take 1 tablet (400 mg total) by mouth 2 (two) times daily.   METOPROLOL SUCCINATE (TOPROL-XL) 25 MG 24 HR TABLET    TAKE 1 TABLET BY MOUTH EVERY DAY WITH OR IMMEDIATELY FOLLOWING A MEAL FOR BLOOD PRESSURE   NEXIUM 40 MG CAPSULE    TAKE 1 CAPSULE BY MOUTH ONCE DAILY   POLYETHYLENE GLYCOL (MIRALAX / GLYCOLAX) PACKET    DISSOLVE 17 GRAMS (1 PACKET) INTO 8 OUNCES OF LIQUID AND DRINK EVERY DAY   POTASSIUM CHLORIDE SA (K-DUR,KLOR-CON) 20 MEQ TABLET    TAKE 1 TABLET BY MOUTH EVERY DAY   SIMVASTATIN (ZOCOR) 20 MG TABLET    TAKE 1 TABLET BY MOUTH EVERY DAY   SUCRALFATE (CARAFATE) 1 G TABLET    TAKE 1 TABLET BY MOUTH TWICE DAILY ON EMPTY STOMACH   TIMOLOL (TIMOPTIC) 0.5 % OPHTHALMIC SOLUTION    INSTILL 1 DROP INTO EACH EYE TWICE A DAY   TORSEMIDE (DEMADEX) 20 MG TABLET    Take 1 tablet by mouth daily for edema.   VENTOLIN HFA 108 (90 BASE) MCG/ACT INHALER    INHALE 1 PUFF 4 TIMES A DAY FOR 5 DAYS, THEN USE EVERY 6 HOURS AS NEEDED  Modified Medications   No medications on file  Discontinued Medications   No medications on file    Review of Systems  Musculoskeletal: Positive for back pain, arthralgias and gait problem.  Neurological: Positive for numbness.  All other systems reviewed and are negative.   Filed Vitals:   02/26/16 1528  BP: 138/70    Pulse: 72  Temp: 98.5 F (36.9 C)  TempSrc: Oral  Resp: 20  Height: 5\' 2"  (1.575 m)  Weight: 221 lb 12.8 oz (100.608 kg)  SpO2: 92%   Body mass index is 40.56 kg/(m^2).  Physical Exam  Constitutional: She is oriented to person, place, and time. She appears well-developed and well-nourished.  HENT:  Mouth/Throat: Oropharynx is clear and moist. No oropharyngeal exudate.  Eyes: Pupils are equal, round, and reactive to light. No scleral icterus.  Neck: Neck supple. Carotid bruit is not present. No tracheal deviation present. No thyromegaly present.  Cardiovascular: Normal rate, regular rhythm, normal heart sounds and intact distal pulses.  Exam reveals no gallop and no friction rub.   No murmur heard. +1 pitting LE edema b/l. no calf TTP.   Pulmonary/Chest: Effort normal and breath sounds normal. No stridor. No respiratory distress. She has no wheezes. She has no rales.  Abdominal: Soft. Bowel sounds are normal. She exhibits no distension and no mass. There is no hepatomegaly. There is tenderness (epigastric TTP). There is no rebound and no guarding.  Musculoskeletal: She exhibits edema and tenderness.  Uses rolling walker  Lymphadenopathy:    She has no cervical adenopathy.  Neurological: She is alert and oriented to person, place, and time.  Skin: Skin is warm and dry. No rash noted.  Psychiatric: She has a normal mood and affect. Her behavior is normal. Thought content normal.     Labs reviewed: Office Visit on 01/07/2016  Component Date Value Ref Range Status  . Glucose 01/07/2016 112* 65 - 99 mg/dL Final  . BUN 03/06/2016 21  8 - 27 mg/dL  Final  . Creatinine, Ser 01/07/2016 1.77* 0.57 - 1.00 mg/dL Final  . GFR calc non Af Amer 01/07/2016 27* >59 mL/min/1.73 Final  . GFR calc Af Amer 01/07/2016 31* >59 mL/min/1.73 Final  . BUN/Creatinine Ratio 01/07/2016 12  11 - 26 Final  . Sodium 01/07/2016 139  134 - 144 mmol/L Final  . Potassium 01/07/2016 5.2  3.5 - 5.2 mmol/L Final   . Chloride 01/07/2016 95* 96 - 106 mmol/L Final  . CO2 01/07/2016 27  18 - 29 mmol/L Final  . Calcium 01/07/2016 9.3  8.7 - 10.3 mg/dL Final  . WBC 23/65/4038 7.5  3.4 - 10.8 x10E3/uL Final  . RBC 01/07/2016 4.31  3.77 - 5.28 x10E6/uL Final  . Hemoglobin 01/07/2016 10.6* 11.1 - 15.9 g/dL Final  . Hematocrit 87/83/2800 32.8* 34.0 - 46.6 % Final  . MCV 01/07/2016 76* 79 - 97 fL Final  . MCH 01/07/2016 24.6* 26.6 - 33.0 pg Final  . MCHC 01/07/2016 32.3  31.5 - 35.7 g/dL Final  . RDW 43/98/2676 17.9* 12.3 - 15.4 % Final  . Platelets 01/07/2016 321  150 - 379 x10E3/uL Final  . Neutrophils 01/07/2016 55   Final  . Lymphs 01/07/2016 30   Final  . Monocytes 01/07/2016 11   Final  . Eos 01/07/2016 4   Final  . Basos 01/07/2016 0   Final  . Neutrophils Absolute 01/07/2016 4.0  1.4 - 7.0 x10E3/uL Final  . Lymphocytes Absolute 01/07/2016 2.3  0.7 - 3.1 x10E3/uL Final  . Monocytes Absolute 01/07/2016 0.9  0.1 - 0.9 x10E3/uL Final  . EOS (ABSOLUTE) 01/07/2016 0.3  0.0 - 0.4 x10E3/uL Final  . Basophils Absolute 01/07/2016 0.0  0.0 - 0.2 x10E3/uL Final  . Immature Granulocytes 01/07/2016 0   Final  . Immature Grans (Abs) 01/07/2016 0.0  0.0 - 0.1 x10E3/uL Final  Admission on 01/01/2016, Discharged on 01/03/2016  Component Date Value Ref Range Status  . WBC 01/01/2016 8.0  4.0 - 10.5 K/uL Final  . RBC 01/01/2016 4.34  3.87 - 5.11 MIL/uL Final  . Hemoglobin 01/01/2016 10.7* 12.0 - 15.0 g/dL Final  . HCT 90/74/2504 34.1* 36.0 - 46.0 % Final  . MCV 01/01/2016 78.6  78.0 - 100.0 fL Final  . MCH 01/01/2016 24.7* 26.0 - 34.0 pg Final  . MCHC 01/01/2016 31.4  30.0 - 36.0 g/dL Final  . RDW 30/43/9353 17.2* 11.5 - 15.5 % Final  . Platelets 01/01/2016 285  150 - 400 K/uL Final  . Neutrophils Relative % 01/01/2016 55   Final  . Neutro Abs 01/01/2016 4.4  1.7 - 7.7 K/uL Final  . Lymphocytes Relative 01/01/2016 30   Final  . Lymphs Abs 01/01/2016 2.4  0.7 - 4.0 K/uL Final  . Monocytes Relative 01/01/2016  9   Final  . Monocytes Absolute 01/01/2016 0.7  0.1 - 1.0 K/uL Final  . Eosinophils Relative 01/01/2016 6   Final  . Eosinophils Absolute 01/01/2016 0.5  0.0 - 0.7 K/uL Final  . Basophils Relative 01/01/2016 0   Final  . Basophils Absolute 01/01/2016 0.0  0.0 - 0.1 K/uL Final  . Sodium 01/01/2016 140  135 - 145 mmol/L Final  . Potassium 01/01/2016 3.7  3.5 - 5.1 mmol/L Final  . Chloride 01/01/2016 104  101 - 111 mmol/L Final  . CO2 01/01/2016 24  22 - 32 mmol/L Final  . Glucose, Bld 01/01/2016 126* 65 - 99 mg/dL Final  . BUN 86/77/3905 25* 6 - 20 mg/dL Final  .  Creatinine, Ser 01/01/2016 1.85* 0.44 - 1.00 mg/dL Final  . Calcium 01/01/2016 9.5  8.9 - 10.3 mg/dL Final  . Total Protein 01/01/2016 7.3  6.5 - 8.1 g/dL Final  . Albumin 01/01/2016 3.7  3.5 - 5.0 g/dL Final  . AST 01/01/2016 15  15 - 41 U/L Final  . ALT 01/01/2016 10* 14 - 54 U/L Final  . Alkaline Phosphatase 01/01/2016 110  38 - 126 U/L Final  . Total Bilirubin 01/01/2016 0.3  0.3 - 1.2 mg/dL Final  . GFR calc non Af Amer 01/01/2016 25* >60 mL/min Final  . GFR calc Af Amer 01/01/2016 29* >60 mL/min Final   Comment: (NOTE) The eGFR has been calculated using the CKD EPI equation. This calculation has not been validated in all clinical situations. eGFR's persistently <60 mL/min signify possible Chronic Kidney Disease.   . Anion gap 01/01/2016 12  5 - 15 Final  . Troponin i, poc 01/01/2016 0.02  0.00 - 0.08 ng/mL Final  . Comment 3 01/01/2016          Final   Comment: Due to the release kinetics of cTnI, a negative result within the first hours of the onset of symptoms does not rule out myocardial infarction with certainty. If myocardial infarction is still suspected, repeat the test at appropriate intervals.   . B Natriuretic Peptide 01/01/2016 41.6  0.0 - 100.0 pg/mL Final  . Hgb A1c MFr Bld 01/01/2016 8.0* 4.8 - 5.6 % Final   Comment: (NOTE)         Pre-diabetes: 5.7 - 6.4         Diabetes: >6.4         Glycemic  control for adults with diabetes: <7.0   . Mean Plasma Glucose 01/01/2016 183   Final   Comment: (NOTE) Performed At: [facility] [place], [place] [id number] Lindon Romp MD [id number]   . Magnesium 01/01/2016 2.3  1.7 - 2.4 mg/dL Final  . TSH 01/01/2016 2.213  0.350 - 4.500 uIU/mL Final  . Troponin I 01/01/2016 <0.03  <0.031 ng/mL Final   Comment:        NO INDICATION OF MYOCARDIAL INJURY.   . Troponin I 01/01/2016 <0.03  <0.031 ng/mL Final   Comment:        NO INDICATION OF MYOCARDIAL INJURY.   . Troponin I 01/02/2016 <0.03  <0.031 ng/mL Final   Comment:        NO INDICATION OF MYOCARDIAL INJURY.   . WBC 01/01/2016 7.1  4.0 - 10.5 K/uL Final  . RBC 01/01/2016 4.20  3.87 - 5.11 MIL/uL Final  . Hemoglobin 01/01/2016 10.3* 12.0 - 15.0 g/dL Final  . HCT 01/01/2016 33.1* 36.0 - 46.0 % Final  . MCV 01/01/2016 78.8  78.0 - 100.0 fL Final  . MCH 01/01/2016 24.5* 26.0 - 34.0 pg Final  . MCHC 01/01/2016 31.1  30.0 - 36.0 g/dL Final  . RDW 01/01/2016 17.3* 11.5 - 15.5 % Final  . Platelets 01/01/2016 256  150 - 400 K/uL Final  . Creatinine, Ser 01/01/2016 1.83* 0.44 - 1.00 mg/dL Final  . GFR calc non Af Amer 01/01/2016 25* >60 mL/min Final  . GFR calc Af Amer 01/01/2016 30* >60 mL/min Final   Comment: (NOTE) The eGFR has been calculated using the CKD EPI equation. This calculation has not been validated in all clinical situations. eGFR's persistently <60 mL/min signify possible Chronic Kidney Disease.   . Sodium 01/02/2016 142  135 - 145 mmol/L  Final  . Potassium 01/02/2016 3.4* 3.5 - 5.1 mmol/L Final  . Chloride 01/02/2016 102  101 - 111 mmol/L Final  . CO2 01/02/2016 29  22 - 32 mmol/L Final  . Glucose, Bld 01/02/2016 139* 65 - 99 mg/dL Final  . BUN 01/02/2016 26* 6 - 20 mg/dL Final  . Creatinine, Ser 01/02/2016 1.90* 0.44 - 1.00 mg/dL Final  . Calcium 01/02/2016 9.1  8.9 - 10.3 mg/dL Final  . GFR calc non Af Amer 01/02/2016 24* >60 mL/min  Final  . GFR calc Af Amer 01/02/2016 28* >60 mL/min Final   Comment: (NOTE) The eGFR has been calculated using the CKD EPI equation. This calculation has not been validated in all clinical situations. eGFR's persistently <60 mL/min signify possible Chronic Kidney Disease.   . Anion gap 01/02/2016 11  5 - 15 Final  . Glucose-Capillary 01/01/2016 201* 65 - 99 mg/dL Final  . Glucose-Capillary 01/01/2016 72  65 - 99 mg/dL Final  . Glucose-Capillary 01/02/2016 126* 65 - 99 mg/dL Final  . Glucose-Capillary 01/02/2016 85  65 - 99 mg/dL Final  . Glucose-Capillary 01/02/2016 120* 65 - 99 mg/dL Final  . Sodium 01/03/2016 141  135 - 145 mmol/L Final  . Potassium 01/03/2016 3.9  3.5 - 5.1 mmol/L Final  . Chloride 01/03/2016 102  101 - 111 mmol/L Final  . CO2 01/03/2016 29  22 - 32 mmol/L Final  . Glucose, Bld 01/03/2016 81  65 - 99 mg/dL Final  . BUN 01/03/2016 20  6 - 20 mg/dL Final  . Creatinine, Ser 01/03/2016 1.87* 0.44 - 1.00 mg/dL Final  . Calcium 01/03/2016 8.9  8.9 - 10.3 mg/dL Final  . Total Protein 01/03/2016 6.9  6.5 - 8.1 g/dL Final  . Albumin 01/03/2016 3.4* 3.5 - 5.0 g/dL Final  . AST 01/03/2016 19  15 - 41 U/L Final  . ALT 01/03/2016 8* 14 - 54 U/L Final  . Alkaline Phosphatase 01/03/2016 99  38 - 126 U/L Final  . Total Bilirubin 01/03/2016 0.5  0.3 - 1.2 mg/dL Final  . GFR calc non Af Amer 01/03/2016 25* >60 mL/min Final  . GFR calc Af Amer 01/03/2016 29* >60 mL/min Final   Comment: (NOTE) The eGFR has been calculated using the CKD EPI equation. This calculation has not been validated in all clinical situations. eGFR's persistently <60 mL/min signify possible Chronic Kidney Disease.   . Anion gap 01/03/2016 10  5 - 15 Final  . WBC 01/03/2016 6.8  4.0 - 10.5 K/uL Final  . RBC 01/03/2016 4.28  3.87 - 5.11 MIL/uL Final  . Hemoglobin 01/03/2016 10.4* 12.0 - 15.0 g/dL Final  . HCT 01/03/2016 33.5* 36.0 - 46.0 % Final  . MCV 01/03/2016 78.3  78.0 - 100.0 fL Final  . MCH  01/03/2016 24.3* 26.0 - 34.0 pg Final  . MCHC 01/03/2016 31.0  30.0 - 36.0 g/dL Final  . RDW 01/03/2016 17.4* 11.5 - 15.5 % Final  . Platelets 01/03/2016 278  150 - 400 K/uL Final  . Glucose-Capillary 01/02/2016 113* 65 - 99 mg/dL Final  . Glucose-Capillary 01/03/2016 108* 65 - 99 mg/dL Final  . Glucose-Capillary 01/03/2016 123* 65 - 99 mg/dL Final  Admission on 12/28/2015, Discharged on 12/28/2015  Component Date Value Ref Range Status  . Sodium 12/28/2015 144  135 - 145 mmol/L Final  . Potassium 12/28/2015 4.0  3.5 - 5.1 mmol/L Final  . Chloride 12/28/2015 103  101 - 111 mmol/L Final  . CO2 12/28/2015 28  22 - 32 mmol/L Final  . Glucose, Bld 12/28/2015 99  65 - 99 mg/dL Final  . BUN 12/28/2015 35* 6 - 20 mg/dL Final  . Creatinine, Ser 12/28/2015 2.03* 0.44 - 1.00 mg/dL Final  . Calcium 12/28/2015 9.8  8.9 - 10.3 mg/dL Final  . Total Protein 12/28/2015 7.4  6.5 - 8.1 g/dL Final  . Albumin 12/28/2015 3.6  3.5 - 5.0 g/dL Final  . AST 12/28/2015 17  15 - 41 U/L Final  . ALT 12/28/2015 9* 14 - 54 U/L Final  . Alkaline Phosphatase 12/28/2015 105  38 - 126 U/L Final  . Total Bilirubin 12/28/2015 0.2* 0.3 - 1.2 mg/dL Final  . GFR calc non Af Amer 12/28/2015 22* >60 mL/min Final  . GFR calc Af Amer 12/28/2015 26* >60 mL/min Final   Comment: (NOTE) The eGFR has been calculated using the CKD EPI equation. This calculation has not been validated in all clinical situations. eGFR's persistently <60 mL/min signify possible Chronic Kidney Disease.   . Anion gap 12/28/2015 13  5 - 15 Final  . WBC 12/28/2015 7.6  4.0 - 10.5 K/uL Final  . RBC 12/28/2015 4.30  3.87 - 5.11 MIL/uL Final  . Hemoglobin 12/28/2015 10.6* 12.0 - 15.0 g/dL Final  . HCT 12/28/2015 33.6* 36.0 - 46.0 % Final  . MCV 12/28/2015 78.1  78.0 - 100.0 fL Final  . MCH 12/28/2015 24.7* 26.0 - 34.0 pg Final  . MCHC 12/28/2015 31.5  30.0 - 36.0 g/dL Final  . RDW 12/28/2015 16.9* 11.5 - 15.5 % Final  . Platelets 12/28/2015 268  150 -  400 K/uL Final  . Neutrophils Relative % 12/28/2015 60   Final  . Neutro Abs 12/28/2015 4.5  1.7 - 7.7 K/uL Final  . Lymphocytes Relative 12/28/2015 27   Final  . Lymphs Abs 12/28/2015 2.0  0.7 - 4.0 K/uL Final  . Monocytes Relative 12/28/2015 7   Final  . Monocytes Absolute 12/28/2015 0.5  0.1 - 1.0 K/uL Final  . Eosinophils Relative 12/28/2015 6   Final  . Eosinophils Absolute 12/28/2015 0.5  0.0 - 0.7 K/uL Final  . Basophils Relative 12/28/2015 0   Final  . Basophils Absolute 12/28/2015 0.0  0.0 - 0.1 K/uL Final  . Color, Urine 12/28/2015 YELLOW  YELLOW Final  . APPearance 12/28/2015 CLEAR  CLEAR Final  . Specific Gravity, Urine 12/28/2015 1.007  1.005 - 1.030 Final  . pH 12/28/2015 5.0  5.0 - 8.0 Final  . Glucose, UA 12/28/2015 NEGATIVE  NEGATIVE mg/dL Final  . Hgb urine dipstick 12/28/2015 TRACE* NEGATIVE Final  . Bilirubin Urine 12/28/2015 NEGATIVE  NEGATIVE Final  . Ketones, ur 12/28/2015 NEGATIVE  NEGATIVE mg/dL Final  . Protein, ur 12/28/2015 NEGATIVE  NEGATIVE mg/dL Final  . Nitrite 12/28/2015 NEGATIVE  NEGATIVE Final  . Leukocytes, UA 12/28/2015 NEGATIVE  NEGATIVE Final  . Troponin I 12/28/2015 <0.03  <0.031 ng/mL Final   Comment:        NO INDICATION OF MYOCARDIAL INJURY.   . B Natriuretic Peptide 12/28/2015 25.5  0.0 - 100.0 pg/mL Final  . Squamous Epithelial / LPF 12/28/2015 0-5* NONE SEEN Final  . WBC, UA 12/28/2015 0-5  0 - 5 WBC/hpf Final  . RBC / HPF 12/28/2015 0-5  0 - 5 RBC/hpf Final  . Bacteria, UA 12/28/2015 RARE* NONE SEEN Final  Hospital Outpatient Visit on 12/19/2015  Component Date Value Ref Range Status  . Rest HR 12/20/2015 55   Final  . Rest BP  12/20/2015 147/74   Final  . Peak HR 12/20/2015 78   Final  . Peak BP 12/20/2015 142/71   Final  . LV sys vol 12/20/2015 27   Final  . TID 12/20/2015 1.08   Final  . LV dias vol 12/20/2015 85   Final  . SSS 12/20/2015 4   Final  . SRS 12/20/2015 0   Final  . SDS 12/20/2015 4   Final  Admission on  12/04/2015, Discharged on 12/08/2015  Component Date Value Ref Range Status  . Sodium 12/04/2015 141  135 - 145 mmol/L Final  . Potassium 12/04/2015 2.9* 3.5 - 5.1 mmol/L Final  . Chloride 12/04/2015 102  101 - 111 mmol/L Final  . CO2 12/04/2015 27  22 - 32 mmol/L Final  . Glucose, Bld 12/04/2015 164* 65 - 99 mg/dL Final  . BUN 12/04/2015 42* 6 - 20 mg/dL Final  . Creatinine, Ser 12/04/2015 2.55* 0.44 - 1.00 mg/dL Final  . Calcium 12/04/2015 9.3  8.9 - 10.3 mg/dL Final  . GFR calc non Af Amer 12/04/2015 17* >60 mL/min Final  . GFR calc Af Amer 12/04/2015 20* >60 mL/min Final   Comment: (NOTE) The eGFR has been calculated using the CKD EPI equation. This calculation has not been validated in all clinical situations. eGFR's persistently <60 mL/min signify possible Chronic Kidney Disease.   . Anion gap 12/04/2015 12  5 - 15 Final  . WBC 12/04/2015 10.5  4.0 - 10.5 K/uL Final  . RBC 12/04/2015 4.79  3.87 - 5.11 MIL/uL Final  . Hemoglobin 12/04/2015 11.8* 12.0 - 15.0 g/dL Final  . HCT 12/04/2015 37.5  36.0 - 46.0 % Final  . MCV 12/04/2015 78.3  78.0 - 100.0 fL Final  . MCH 12/04/2015 24.6* 26.0 - 34.0 pg Final  . MCHC 12/04/2015 31.5  30.0 - 36.0 g/dL Final  . RDW 12/04/2015 16.6* 11.5 - 15.5 % Final  . Platelets 12/04/2015 330  150 - 400 K/uL Final  . Troponin i, poc 12/04/2015 0.02  0.00 - 0.08 ng/mL Final  . Comment 3 12/04/2015          Final   Comment: Due to the release kinetics of cTnI, a negative result within the first hours of the onset of symptoms does not rule out myocardial infarction with certainty. If myocardial infarction is still suspected, repeat the test at appropriate intervals.   . B Natriuretic Peptide 12/05/2015 23.6  0.0 - 100.0 pg/mL Final  . Specimen Description 12/05/2015 BLOOD RIGHT HAND   Final  . Special Requests 12/05/2015 BOTTLES DRAWN AEROBIC AND ANAEROBIC 5CC   Final  . Culture 12/05/2015    Final                   Value:NO GROWTH 5  DAYS Performed at [facility]   . Report Status 12/05/2015 12/10/2015 FINAL   Final  . Specimen Description 12/05/2015 BLOOD LEFT ANTECUBITAL   Final  . Special Requests 12/05/2015 BOTTLES DRAWN AEROBIC AND ANAEROBIC 10CC   Final  . Culture 12/05/2015    Final                   Value:NO GROWTH 5 DAYS Performed at [facility]   . Report Status 12/05/2015 12/10/2015 FINAL   Final  . HIV Screen 4th Generation wRfx 12/05/2015 Non Reactive  Non Reactive Final   Comment: (NOTE) Performed At: [facility] [place], [place] [id number] Lindon Romp MD  IO:[id number]   . Strep Pneumo Urinary Antigen 12/05/2015 NEGATIVE  NEGATIVE Final   Comment:        Infection due to S. pneumoniae cannot be absolutely ruled out since the antigen present may be below the detection limit of the test. Performed at [facility]   . Magnesium 12/05/2015 1.9  1.7 - 2.4 mg/dL Final  . B Natriuretic Peptide 12/05/2015 20.3  0.0 - 100.0 pg/mL Final  . Specimen Description 12/05/2015 URINE, CLEAN CATCH   Final  . Special Requests 12/05/2015 NONE   Final  . Legionella Antigen, Urine 12/05/2015    Final                   Value:Negative for Legionella pneumophila serogroup 1                                                              Legionella pneumophila serogroup 1 antigen can be detected in urine within 2 to 3 days of infection and may persist even after treatment. This  assay does not detect other Legionella species or serogroups. Performed at [facility]   . Report Status 12/05/2015 12/06/2015 FINAL   Final  . Hgb A1c MFr Bld 12/05/2015 7.6* 4.8 - 5.6 % Final   Comment: (NOTE)         Pre-diabetes: 5.7 - 6.4         Diabetes: >6.4         Glycemic control for adults with diabetes: <7.0   . Mean Plasma Glucose 12/05/2015 171   Final   Comment: (NOTE) Performed At: [facility] [place], [place] [id number] Lindon Romp MD  HO:[id number]   . Procalcitonin 12/05/2015 <0.10   Final   Comment:        Interpretation: PCT (Procalcitonin) <= 0.5 ng/mL: Systemic infection (sepsis) is not likely. Local bacterial infection is possible. (NOTE)         ICU PCT Algorithm               Non ICU PCT Algorithm    ----------------------------     ------------------------------         PCT < 0.25 ng/mL                 PCT < 0.1 ng/mL     Stopping of antibiotics            Stopping of antibiotics       strongly encouraged.               strongly encouraged.    ----------------------------     ------------------------------       PCT level decrease by               PCT < 0.25 ng/mL       >= 80% from peak PCT       OR PCT 0.25 - 0.5 ng/mL          Stopping of antibiotics                                             encouraged.  Stopping of antibiotics           encouraged.    ----------------------------     ------------------------------       PCT level decrease by              PCT >= 0.25 ng/mL       < 80% from peak PCT        AND PCT >= 0.5 ng/mL            Continuin                          g antibiotics                                              encouraged.       Continuing antibiotics            encouraged.    ----------------------------     ------------------------------     PCT level increase compared          PCT > 0.5 ng/mL         with peak PCT AND          PCT >= 0.5 ng/mL             Escalation of antibiotics                                          strongly encouraged.      Escalation of antibiotics        strongly encouraged.   . Glucose-Capillary 12/05/2015 253* 65 - 99 mg/dL Final  . Glucose-Capillary 12/05/2015 249* 65 - 99 mg/dL Final  . Sodium 12/06/2015 142  135 - 145 mmol/L Final  . Potassium 12/06/2015 4.1  3.5 - 5.1 mmol/L Final   Comment: RESULT REPEATED AND VERIFIED DELTA CHECK NOTED NO VISIBLE HEMOLYSIS   . Chloride 12/06/2015 106  101 - 111 mmol/L Final  . CO2 12/06/2015 28  22  - 32 mmol/L Final  . Glucose, Bld 12/06/2015 171* 65 - 99 mg/dL Final  . BUN 12/06/2015 38* 6 - 20 mg/dL Final  . Creatinine, Ser 12/06/2015 2.05* 0.44 - 1.00 mg/dL Final  . Calcium 12/06/2015 9.0  8.9 - 10.3 mg/dL Final  . GFR calc non Af Amer 12/06/2015 22* >60 mL/min Final  . GFR calc Af Amer 12/06/2015 26* >60 mL/min Final   Comment: (NOTE) The eGFR has been calculated using the CKD EPI equation. This calculation has not been validated in all clinical situations. eGFR's persistently <60 mL/min signify possible Chronic Kidney Disease.   . Anion gap 12/06/2015 8  5 - 15 Final  . Glucose-Capillary 12/05/2015 217* 65 - 99 mg/dL Final  . Glucose-Capillary 12/05/2015 172* 65 - 99 mg/dL Final  . MRSA by PCR 12/06/2015 NEGATIVE  NEGATIVE Final   Comment:        The GeneXpert MRSA Assay (FDA approved for NASAL specimens only), is one component of a comprehensive MRSA colonization surveillance program. It is not intended to diagnose MRSA infection nor to guide or monitor treatment for MRSA infections.   . Glucose-Capillary 12/06/2015 123* 65 - 99 mg/dL Final  . Glucose-Capillary 12/06/2015 166* 65 - 99 mg/dL Final  . Glucose-Capillary  12/06/2015 176* 65 - 99 mg/dL Final  . Sodium 12/07/2015 143  135 - 145 mmol/L Final  . Potassium 12/07/2015 4.7  3.5 - 5.1 mmol/L Final  . Chloride 12/07/2015 103  101 - 111 mmol/L Final  . CO2 12/07/2015 32  22 - 32 mmol/L Final  . Glucose, Bld 12/07/2015 134* 65 - 99 mg/dL Final  . BUN 12/07/2015 41* 6 - 20 mg/dL Final  . Creatinine, Ser 12/07/2015 1.98* 0.44 - 1.00 mg/dL Final  . Calcium 12/07/2015 9.1  8.9 - 10.3 mg/dL Final  . GFR calc non Af Amer 12/07/2015 23* >60 mL/min Final  . GFR calc Af Amer 12/07/2015 27* >60 mL/min Final   Comment: (NOTE) The eGFR has been calculated using the CKD EPI equation. This calculation has not been validated in all clinical situations. eGFR's persistently <60 mL/min signify possible Chronic  Kidney Disease.   . Anion gap 12/07/2015 8  5 - 15 Final  . Procalcitonin 12/07/2015 <0.10   Final   Comment:        Interpretation: PCT (Procalcitonin) <= 0.5 ng/mL: Systemic infection (sepsis) is not likely. Local bacterial infection is possible. (NOTE)         ICU PCT Algorithm               Non ICU PCT Algorithm    ----------------------------     ------------------------------         PCT < 0.25 ng/mL                 PCT < 0.1 ng/mL     Stopping of antibiotics            Stopping of antibiotics       strongly encouraged.               strongly encouraged.    ----------------------------     ------------------------------       PCT level decrease by               PCT < 0.25 ng/mL       >= 80% from peak PCT       OR PCT 0.25 - 0.5 ng/mL          Stopping of antibiotics                                             encouraged.     Stopping of antibiotics           encouraged.    ----------------------------     ------------------------------       PCT level decrease by              PCT >= 0.25 ng/mL       < 80% from peak PCT        AND PCT >= 0.5 ng/mL            Continuin                          g antibiotics                                              encouraged.       Continuing  antibiotics            encouraged.    ----------------------------     ------------------------------     PCT level increase compared          PCT > 0.5 ng/mL         with peak PCT AND          PCT >= 0.5 ng/mL             Escalation of antibiotics                                          strongly encouraged.      Escalation of antibiotics        strongly encouraged.   . WBC 12/07/2015 8.9  4.0 - 10.5 K/uL Final  . RBC 12/07/2015 4.27  3.87 - 5.11 MIL/uL Final  . Hemoglobin 12/07/2015 10.6* 12.0 - 15.0 g/dL Final  . HCT 90/98/3861 34.0* 36.0 - 46.0 % Final  . MCV 12/07/2015 79.6  78.0 - 100.0 fL Final  . MCH 12/07/2015 24.8* 26.0 - 34.0 pg Final  . MCHC 12/07/2015 31.2  30.0 - 36.0 g/dL Final   . RDW 79/99/9514 17.0* 11.5 - 15.5 % Final  . Platelets 12/07/2015 281  150 - 400 K/uL Final  . Glucose-Capillary 12/06/2015 172* 65 - 99 mg/dL Final  . Glucose-Capillary 12/07/2015 124* 65 - 99 mg/dL Final  . Glucose-Capillary 12/07/2015 181* 65 - 99 mg/dL Final  . Sodium 58/93/5825 140  135 - 145 mmol/L Final  . Potassium 12/08/2015 4.3  3.5 - 5.1 mmol/L Final  . Chloride 12/08/2015 101  101 - 111 mmol/L Final  . CO2 12/08/2015 29  22 - 32 mmol/L Final  . Glucose, Bld 12/08/2015 174* 65 - 99 mg/dL Final  . BUN 14/99/4303 42* 6 - 20 mg/dL Final  . Creatinine, Ser 12/08/2015 1.98* 0.44 - 1.00 mg/dL Final  . Calcium 55/84/4168 9.4  8.9 - 10.3 mg/dL Final  . GFR calc non Af Amer 12/08/2015 23* >60 mL/min Final  . GFR calc Af Amer 12/08/2015 27* >60 mL/min Final   Comment: (NOTE) The eGFR has been calculated using the CKD EPI equation. This calculation has not been validated in all clinical situations. eGFR's persistently <60 mL/min signify possible Chronic Kidney Disease.   . Anion gap 12/08/2015 10  5 - 15 Final  . WBC 12/08/2015 9.3  4.0 - 10.5 K/uL Final  . RBC 12/08/2015 4.58  3.87 - 5.11 MIL/uL Final  . Hemoglobin 12/08/2015 11.3* 12.0 - 15.0 g/dL Final  . HCT 35/87/7876 36.3  36.0 - 46.0 % Final  . MCV 12/08/2015 79.3  78.0 - 100.0 fL Final  . MCH 12/08/2015 24.7* 26.0 - 34.0 pg Final  . MCHC 12/08/2015 31.1  30.0 - 36.0 g/dL Final  . RDW 08/72/3753 17.1* 11.5 - 15.5 % Final  . Platelets 12/08/2015 310  150 - 400 K/uL Final  . Glucose-Capillary 12/07/2015 100* 65 - 99 mg/dL Final  . Glucose-Capillary 12/07/2015 211* 65 - 99 mg/dL Final  . Glucose-Capillary 12/08/2015 153* 65 - 99 mg/dL Final  . TSH 86/55/5767 2.584  0.350 - 4.500 uIU/mL Final  . Glucose-Capillary 12/08/2015 147* 65 - 99 mg/dL Final    No results found.   Assessment/Plan   ICD-9-CM ICD-10-CM   1. Spinal stenosis of lumbar region 724.02 M48.06   2. Chronic pain  338.29 G89.29   3. Essential  hypertension, benign 401.1 I10   4. Type 2 diabetes mellitus with diabetic nephropathy, with long-term current use of insulin (HCC) 250.40 E11.21    583.81 Z79.4    V58.67    5. Bilateral edema of lower extremity 782.3 R60.0   6. Chronic diastolic congestive heart failure (HCC) 428.32 I50.32    428.0    7. Chronic obstructive pulmonary disease, unspecified COPD type ([place]) 496 J44.9   8. Uncontrolled type 2 diabetes mellitus with chronic kidney disease, with long-term current use of insulin, unspecified CKD stage (HCC) 250.42 E11.22    585.9 Z79.4    V58.67 E11.65    Increase fentanyl patch to 84mg daily  Continue other medications as ordered  Follow up in 1 month for pain  Maksim Peregoy S. CPerlie Gold PCarolina Mountain [facility] [address]GS[address] [place] [address]([phone] (Monday-Friday 8 AM - 5 PM) ([phone] 5 PM and follow prompts

## 2016-02-26 NOTE — Addendum Note (Signed)
Addended by: Leonie Green on: 02/26/2016 04:45 PM   Modules accepted: Orders

## 2016-02-27 LAB — COMPREHENSIVE METABOLIC PANEL
ALT: 7 IU/L (ref 0–32)
AST: 20 IU/L (ref 0–40)
Albumin/Globulin Ratio: 1.5 (ref 1.2–2.2)
Albumin: 4.1 g/dL (ref 3.5–4.8)
Alkaline Phosphatase: 105 IU/L (ref 39–117)
BUN/Creatinine Ratio: 13 (ref 11–26)
BUN: 27 mg/dL (ref 8–27)
Bilirubin Total: <0.2 mg/dL (ref 0.0–1.2)
CO2: 21 mmol/L (ref 18–29)
Calcium: 9.5 mg/dL (ref 8.7–10.3)
Chloride: 105 mmol/L (ref 96–106)
Creatinine, Ser: 2.02 mg/dL — ABNORMAL HIGH (ref 0.57–1.00)
GFR calc Af Amer: 27 mL/min/{1.73_m2} — ABNORMAL LOW (ref >59)
GFR calc non Af Amer: 23 mL/min/{1.73_m2} — ABNORMAL LOW (ref >59)
Globulin, Total: 2.7 g/dL (ref 1.5–4.5)
Glucose: 132 mg/dL — ABNORMAL HIGH (ref 65–99)
Potassium: 3.4 mmol/L — ABNORMAL LOW (ref 3.5–5.2)
Sodium: 143 mmol/L (ref 134–144)
Total Protein: 6.8 g/dL (ref 6.0–8.5)

## 2016-02-27 LAB — MICROALBUMIN / CREATININE URINE RATIO
Creatinine, Urine: 56.9 mg/dL
MICROALB/CREAT RATIO: <5.3 mg/g creat (ref 0.0–30.0)
Microalbumin, Urine: <3 ug/mL

## 2016-02-27 LAB — HEMOGLOBIN A1C
Est. average glucose Bld gHb Est-mCnc: 169 mg/dL
Hgb A1c MFr Bld: 7.5 % — ABNORMAL HIGH (ref 4.8–5.6)

## 2016-03-06 ENCOUNTER — Other Ambulatory Visit: Payer: Self-pay | Admitting: *Deleted

## 2016-03-06 DIAGNOSIS — M545 Low back pain, unspecified: Secondary | ICD-10-CM

## 2016-03-06 DIAGNOSIS — G8929 Other chronic pain: Secondary | ICD-10-CM

## 2016-03-06 MED ORDER — HYDROCODONE-ACETAMINOPHEN 10-325 MG PO TABS: 0.5000 | ORAL_TABLET | Freq: Three times a day (TID) | ORAL | Status: DC

## 2016-03-09 DIAGNOSIS — H401131 Primary open-angle glaucoma, bilateral, mild stage: Secondary | ICD-10-CM | POA: Diagnosis not present

## 2016-03-11 ENCOUNTER — Ambulatory Visit: Payer: Self-pay | Admitting: *Deleted

## 2016-03-12 ENCOUNTER — Other Ambulatory Visit: Payer: Self-pay | Admitting: *Deleted

## 2016-03-12 NOTE — Patient Outreach (Signed)
[facility]) Care Management   03/12/2016  Vannah Battie 1936-01-03 [id number]  Lenora Huebert is an 80 y.o. female  Subjective:  HF: Pt reports she continues to document all her readings for weight, BP and her CBG. Number looks good with no issues reported. Pt reviewed the HF zones and confirms she is in the GREEN zone with no problems and aware when to contact her provider with issues. Pt remains hesitate with remembering her HF medication (will review). NUTRITIONAL: States she has changes her eating habits and STOP all together drinking sodas. States she can "tell the difference" and encouraging other family members to do the same.  EDEMA: Pt states the swelling to her lower legs have reduce "alot" only a small amount since she has stop drinking soda. Pt will continue to elevate her legs as needed. Note pt can not tolerate the compression stockings. GI: Pt reports history of constipation and what has and has not worked in the past (provider aware). Pt states she take epsom salt that helps and her last successful bowel movement was yesterday. Pt states it had been 4 days since her last bowel movement. Pt is aware that she needs to go more often however this has been her history of bowel movements. Pt has opt for one addition home visit from this RN verse a referral to a health coach based upon her successful meeting most of her goals.   Objective:   Review of Systems  Constitutional: Negative.   HENT: Negative.   Eyes: Negative.   Respiratory: Negative.   Cardiovascular: Negative.   Gastrointestinal: Negative.   Genitourinary: Negative.   Musculoskeletal: Negative.   Skin: Negative.   Neurological: Negative.   Endo/Heme/Allergies: Negative.   Psychiatric/Behavioral: Negative.     Physical Exam  Constitutional: She is oriented to person, place, and time. She appears well-developed and well-nourished.  HENT:  Right Ear: External ear normal.  Left Ear: External ear  normal.  Eyes: EOM are normal.  Neck: Normal range of motion.  Cardiovascular: Normal heart sounds.   Respiratory: Effort normal and breath sounds normal.  GI: Soft.  Musculoskeletal: Normal range of motion.  Neurological: She is alert and oriented to person, place, and time.  Skin: Skin is warm and dry.  Psychiatric: She has a normal mood and affect. Her behavior is normal. Judgment and thought content normal.    Encounter Medications:   Outpatient Encounter Prescriptions as of 03/12/2016  Medication Sig Note  . AMITIZA 24 MCG capsule TAKE 1 CAPSULE BY MOUTH 2 TIMES A DAY WITH MEALS (Patient taking differently: TAKE 24 MCG BY MOUTH 2 TIMES A DAY WITH MEALS)   . aspirin EC 81 MG tablet Take 81 mg by mouth daily.   . budesonide (PULMICORT) 0.25 MG/2ML nebulizer solution Take one ampule in Neb twice daily routinely   . Cholecalciferol (VITAMIN D3) 2000 units TABS Take 2,000 Units by mouth daily.   . cloNIDine (CATAPRES) 0.1 MG tablet TAKE 1 TABLET BY MOUTH EVERY DAY   . cycloSPORINE (RESTASIS) 0.05 % ophthalmic emulsion Place 1 drop into both eyes 2 (two) times daily. One drop once a day bilaterally for dry eyes   . diclofenac sodium (VOLTAREN) 1 % GEL APPLY 4 GRAM TOPICALLY TWICE DAILY as needed for pain   . DULoxetine (CYMBALTA) 30 MG capsule TAKE 1 CAPSULE BY MOUTH EVERY DAY TO HELP ANXIETY AND PAINS (Patient taking differently: TAKE 30 MG BY MOUTH EVERY DAY TO HELP ANXIETY AND PAINS)   .  EASY TOUCH INSULIN SYRINGE 31G X 5/16" 0.5 ML MISC USE UP TO 2 TIMES A DAY AS DIRECTED   . enalapril (VASOTEC) 10 MG tablet TAKE 1 TABLET BY MOUTH ONCE DAILY FOR BLOOD PRESSURE AND STRENGTHEN HEART   . fentaNYL (DURAGESIC - DOSED MCG/HR) 50 MCG/HR Place 1 patch (50 mcg total) onto the skin every 3 (three) days. For pain. Remove old patch.   . furosemide (LASIX) 40 MG tablet TAKE 1 TABLET BY MOUTH TWICE A DAY   . gabapentin (NEURONTIN) 100 MG capsule TAKE 2 CAPSULES BY MOUTH THREE TIMES A DAY (Patient  taking differently: TAKE 200 MG BY MOUTH THREE TIMES A DAY)   . guaifenesin (ROBITUSSIN) 100 MG/5ML syrup Take 200 mg by mouth 3 (three) times daily as needed for cough. 12/28/2015: Diabetic cough syrup  . hydrALAZINE (APRESOLINE) 50 MG tablet TAKE 1 TABLET BY MOUTH TWICE DAILY   . HYDROcodone-acetaminophen (NORCO) 10-325 MG tablet Take 0.5 tablets by mouth every 8 (eight) hours.   . insulin NPH-regular Human (HUMULIN 70/30) (70-30) 100 UNIT/ML injection Inject 40 units subcutaneously with breakfast and 25 units with dinner to control blood sugar. 01/01/2016: Had both doses last night  . ipratropium-albuterol (DUONEB) 0.5-2.5 (3) MG/3ML SOLN Take 30mls by nebulization every 6 hours as needed for SOB/Wheezing   . latanoprost (XALATAN) 0.005 % ophthalmic solution Place 1 drop into both eyes at bedtime.   . magnesium oxide (MAG-OX) 400 MG tablet Take 1 tablet (400 mg total) by mouth 2 (two) times daily.   . metoprolol succinate (TOPROL-XL) 25 MG 24 hr tablet TAKE 1 TABLET BY MOUTH EVERY DAY WITH OR IMMEDIATELY FOLLOWING A MEAL FOR BLOOD PRESSURE   . NEXIUM 40 MG capsule TAKE 1 CAPSULE BY MOUTH ONCE DAILY (Patient taking differently: TAKE 40 MG BY MOUTH ONCE DAILY)   . potassium chloride SA (K-DUR,KLOR-CON) 20 MEQ tablet TAKE 1 TABLET BY MOUTH EVERY DAY   . simvastatin (ZOCOR) 20 MG tablet TAKE 1 TABLET BY MOUTH EVERY DAY   . sucralfate (CARAFATE) 1 g tablet TAKE 1 TABLET BY MOUTH TWICE DAILY ON EMPTY STOMACH   . timolol (TIMOPTIC) 0.5 % ophthalmic solution INSTILL 1 DROP INTO EACH EYE TWICE A DAY   . torsemide (DEMADEX) 20 MG tablet Take 1 tablet by mouth daily for edema.   . VENTOLIN HFA 108 (90 Base) MCG/ACT inhaler INHALE 1 PUFF 4 TIMES A DAY FOR 5 DAYS, THEN USE EVERY 6 HOURS AS NEEDED (Patient taking differently: INHALE 1 PUFF 4 TIMES A DAY FOR 5 DAYS, THEN USE EVERY 6 HOURS AS NEEDED for shortness of breath/wheezing)   . polyethylene glycol (MIRALAX / GLYCOLAX) packet DISSOLVE 17 GRAMS (1 PACKET) INTO  8 OUNCES OF LIQUID AND DRINK EVERY DAY (Patient not taking: Reported on 03/12/2016)    No facility-administered encounter medications on file as of 03/12/2016.    Functional Status:   In your present state of health, do you have any difficulty performing the following activities: 01/01/2016 12/31/2015  Hearing? Y (No Data)  Vision? N N  Difficulty concentrating or making decisions? N N  Walking or climbing stairs? Y Y  Dressing or bathing? N -  Doing errands, shopping? Tempie Donning    Fall/Depression Screening:    PHQ 2/9 Scores 03/12/2016 01/08/2016 12/31/2015 12/10/2015 05/30/2015 10/10/2014 05/23/2013  PHQ - 2 Score 0 0 0 0 0 0 0  BP 118/62 mmHg  Pulse 63  Resp 20  Wt 220 lb (99.791 kg)  SpO2 96%  Assessment:  Ongoing case management related to HF Follow up on health eating habits related to reduced sodium Follow up swelling related to lower extremities GI issues related to constipation Plan:  Will completed a physical assessment with no findings on today's assessment. Will verify pt remains in the GREEN zone with no symptoms encountered over the last month. Will allow pt to express her increase knowledge base on the HF zones via teach back method. Reviewed the calendar tools with the 3 HF zones. Pt feels she is able to recognize with she needs to call her provider.  Will verify pt has converted to a more healthy eating habits with reduce sodium intake resulting in minimal swelling ot her lower legs. Will continue to encouraged pt to elevate her lower legs to reduce ongoing swelling. Will discuss bowel regimen with OTC medication that has been successful in the past with this pt. Will strongly encourage pt to take a daily medication helps with a productive bowel movement. Will discuss what will happen if pt has delayed bowels over a course of several days as far as the risk of perforation or straining of her bowels ( pt with understanding). Other OTC medication dicussed however pt indicates EPSOM  works the best.  Plan of care discussed along with adjusted goals to allow pt adherence over the next month. Pt doing very well as RN offered a Engineer, maintenance but pt prefers one additional home visit from this RN prior to possible discharge next month. Will schedule accordingly and update provider per policy. RN will continue to encourage pt on adherence in managing her medical condition.   [name], RN Care Management Coordinator [place] [facility] [phone]

## 2016-03-17 ENCOUNTER — Other Ambulatory Visit: Payer: Self-pay | Admitting: Nurse Practitioner

## 2016-03-25 ENCOUNTER — Encounter: Payer: Self-pay | Admitting: Pharmacotherapy

## 2016-03-26 DIAGNOSIS — I129 Hypertensive chronic kidney disease with stage 1 through stage 4 chronic kidney disease, or unspecified chronic kidney disease: Secondary | ICD-10-CM | POA: Diagnosis not present

## 2016-03-26 DIAGNOSIS — N140 Analgesic nephropathy: Secondary | ICD-10-CM | POA: Diagnosis not present

## 2016-03-26 DIAGNOSIS — N183 Chronic kidney disease, stage 3 (moderate): Secondary | ICD-10-CM | POA: Diagnosis not present

## 2016-03-31 ENCOUNTER — Other Ambulatory Visit: Payer: Self-pay

## 2016-03-31 MED ORDER — FENTANYL 50 MCG/HR TD PT72: 50.0000 ug | MEDICATED_PATCH | TRANSDERMAL | Status: DC

## 2016-03-31 NOTE — Telephone Encounter (Signed)
I initially called patient to find out if she used [facility] for her diabetic supplies. Patient stated that she did.   She also stated that she recently moved on 03/13/2016 and that she had lost her medications during the move. She stated that she needed Hydrocodone/APAP and Fentanyl patches. Since the hydrocodone/apap was filled on 03/06/2016, I told her that we could not give her another Rx for that until 04/05/16. The fentanyl was last filled on 02/26/16, so i did print a Rx for fentanyl. Patient has been notified to pick it up at the office.

## 2016-04-01 ENCOUNTER — Telehealth: Payer: Self-pay

## 2016-04-01 NOTE — Telephone Encounter (Signed)
Form was placed in Dr. Cyndi Lennert folder for review and signing. The form should be faxed back to the number listed at top of form.

## 2016-04-01 NOTE — Telephone Encounter (Signed)
Received a fax from [facility] that requested a prescription for alcohol pads for use with diabetic testing supplies be sent to them. I called the patient to verify that she actually used this mail order company and she stated that she did use it for all of her supplies.

## 2016-04-08 ENCOUNTER — Other Ambulatory Visit: Payer: Self-pay | Admitting: *Deleted

## 2016-04-08 ENCOUNTER — Encounter: Payer: Self-pay | Admitting: *Deleted

## 2016-04-08 NOTE — Patient Outreach (Addendum)
[facility]) Care Management   04/08/2016  Michelle Buck Jun 11, 1936 [id number]  Michelle Buck is an 80 y.o. female  Subjective:  HF: Pt reports she continue to weight daily and document all readings with no signs or symptoms last RN last home visit. Pt able to resite some symptoms and aware of what to do if encountered. Pt states she feels comfortable with managing her HF at this time. Pt denies any other resources needed at this time. SWELLING: Pt reports some swelling however this is her history to her lower legs but will continue to elevate as pt refused to wear the compression stocking but have them if needed. Pt sleep in her recliner and spends most of the day again in her recliner and can elevate her lower legs if needed.  APPOINTMENTS: Pt reports she has been attending all her schedule medical appointments with no sufficient transportation (pt's brother assist with her transportation).  MEDICATION: Pt reports she has been taking her medication as prescribed with no problems. States her needed refills she will obtain on a provider's visit this Friday. Pt indicates she has enough of those medications until that time.  No other request or inquires at this time as pt states she believes she is able to manage her ongoing health independently concerning her HF with no problems at this time. Pt grateful for the [facility] services and aware if [facility] is needed in the future to contact us directly.   Objective:   Review of Systems  All other systems reviewed and are negative.   Physical Exam  Constitutional: She is oriented to person, place, and time. She appears well-developed and well-nourished.  HENT:  Right Ear: External ear normal.  Left Ear: External ear normal.  Eyes: EOM are normal.  Neck: Normal range of motion.  Cardiovascular: Normal heart sounds.   Respiratory: Effort normal and breath sounds normal.  GI: Soft. Bowel sounds are normal.  Musculoskeletal: Normal range of  motion.  Neurological: She is alert and oriented to person, place, and time.  Skin: Skin is warm and dry.  Psychiatric: She has a normal mood and affect. Her behavior is normal. Judgment and thought content normal.    Encounter Medications:   Outpatient Encounter Prescriptions as of 04/08/2016  Medication Sig Note  . AMITIZA 24 MCG capsule TAKE 1 CAPSULE BY MOUTH 2 TIMES A DAY WITH MEALS   . aspirin EC 81 MG tablet Take 81 mg by mouth daily.   . budesonide (PULMICORT) 0.25 MG/2ML nebulizer solution Take one ampule in Neb twice daily routinely   . Cholecalciferol (VITAMIN D3) 2000 units TABS Take 2,000 Units by mouth daily.   . cloNIDine (CATAPRES) 0.1 MG tablet TAKE 1 TABLET BY MOUTH EVERY DAY   . cycloSPORINE (RESTASIS) 0.05 % ophthalmic emulsion Place 1 drop into both eyes 2 (two) times daily. One drop once a day bilaterally for dry eyes   . diclofenac sodium (VOLTAREN) 1 % GEL APPLY 4 GRAM TOPICALLY TWICE DAILY as needed for pain   . DULoxetine (CYMBALTA) 30 MG capsule TAKE 1 CAPSULE BY MOUTH EVERY DAY TO HELP ANXIETY AND PAINS (Patient taking differently: TAKE 30 MG BY MOUTH EVERY DAY TO HELP ANXIETY AND PAINS)   . EASY TOUCH INSULIN SYRINGE 31G X 5/16" 0.5 ML MISC USE UP TO 2 TIMES A DAY AS DIRECTED   . enalapril (VASOTEC) 10 MG tablet TAKE 1 TABLET BY MOUTH ONCE DAILY FOR BLOOD PRESSURE AND STRENGTHEN HEART   . fentaNYL (DURAGESIC -  DOSED MCG/HR) 50 MCG/HR Place 1 patch (50 mcg total) onto the skin every 3 (three) days. For pain. Remove old patch.   . furosemide (LASIX) 40 MG tablet TAKE 1 TABLET BY MOUTH TWICE A DAY   . gabapentin (NEURONTIN) 100 MG capsule TAKE 2 CAPSULES BY MOUTH THREE TIMES A DAY   . guaifenesin (ROBITUSSIN) 100 MG/5ML syrup Take 200 mg by mouth 3 (three) times daily as needed for cough. 12/28/2015: Diabetic cough syrup  . hydrALAZINE (APRESOLINE) 50 MG tablet TAKE 1 TABLET BY MOUTH TWICE DAILY   . HYDROcodone-acetaminophen (NORCO) 10-325 MG tablet Take 0.5 tablets by  mouth every 8 (eight) hours.   . insulin NPH-regular Human (HUMULIN 70/30) (70-30) 100 UNIT/ML injection Inject 40 units subcutaneously with breakfast and 25 units with dinner to control blood sugar. 01/01/2016: Had both doses last night  . ipratropium-albuterol (DUONEB) 0.5-2.5 (3) MG/3ML SOLN Take 46ms by nebulization every 6 hours as needed for SOB/Wheezing   . latanoprost (XALATAN) 0.005 % ophthalmic solution Place 1 drop into both eyes at bedtime.   . magnesium oxide (MAG-OX) 400 MG tablet Take 1 tablet (400 mg total) by mouth 2 (two) times daily.   . metoprolol succinate (TOPROL-XL) 25 MG 24 hr tablet TAKE 1 TABLET BY MOUTH EVERY DAY WITH OR IMMEDIATELY FOLLOWING A MEAL FOR BLOOD PRESSURE   . NEXIUM 40 MG capsule TAKE 1 CAPSULE BY MOUTH ONCE DAILY (Patient taking differently: TAKE 40 MG BY MOUTH ONCE DAILY)   . polyethylene glycol (MIRALAX / GLYCOLAX) packet DISSOLVE 17 GRAMS (1 PACKET) INTO 8 OUNCES OF LIQUID AND DRINK EVERY DAY   . potassium chloride SA (K-DUR,KLOR-CON) 20 MEQ tablet TAKE 1 TABLET BY MOUTH EVERY DAY   . simvastatin (ZOCOR) 20 MG tablet TAKE 1 TABLET BY MOUTH EVERY DAY   . sucralfate (CARAFATE) 1 g tablet TAKE 1 TABLET BY MOUTH TWICE DAILY ON EMPTY STOMACH   . timolol (TIMOPTIC) 0.5 % ophthalmic solution INSTILL 1 DROP INTO EACH EYE TWICE A DAY   . torsemide (DEMADEX) 20 MG tablet Take 1 tablet by mouth daily for edema.   . VENTOLIN HFA 108 (90 Base) MCG/ACT inhaler INHALE 1 PUFF 4 TIMES A DAY FOR 5 DAYS, THEN USE EVERY 6 HOURS AS NEEDED (Patient taking differently: INHALE 1 PUFF 4 TIMES A DAY FOR 5 DAYS, THEN USE EVERY 6 HOURS AS NEEDED for shortness of breath/wheezing)    No facility-administered encounter medications on file as of 04/08/2016.    Functional Status:   In your present state of health, do you have any difficulty performing the following activities: 04/08/2016 01/01/2016  Hearing? N Y  Vision? N N  Difficulty concentrating or making decisions? N N  Walking or  climbing stairs? Y Y  Dressing or bathing? Y N  Doing errands, shopping? YTempie Donning Preparing Food and eating ? Y -  Using the Toilet? N -  In the past six months, have you accidently leaked urine? N -  Do you have problems with loss of bowel control? N -  Managing your Medications? N -  Managing your Finances? N -  Housekeeping or managing your Housekeeping? Y -   BP 111/62 mmHg  Pulse 64  Resp 20  Ht 1.575 m (_0 )  Wt 220 lb (99.791 kg)  BMI 40.23 kg/m2  SpO2 95% Fall/Depression Screening:    PHQ 2/9 Scores 04/08/2016 03/12/2016 01/08/2016 12/31/2015 12/10/2015 05/30/2015 10/10/2014  PHQ - 2 Score 0 0 0 0 0 0 0  Assessment:   Follow up case management issues related to HF Follow up bilateral swelling related to lower legs Follow up adherence related to medical appointments Follow up on adherence related to medications.  Plan:  Will completed a physical assessment and verified pt continue to manage her HF with no encountered problems or symptoms. Will review and verified pt's understanding of the HF zones and what to do if acute symptoms should occur. Will inquired if pt needs additional resources or education material that has not been covered over the last few months.  Will verify pt has not gained 3 lbs overnight or 5 lbs within one week based upon the HF criteria. Note weights documented indicate today at 220 lbs, yesterday 220 lbs and one week weigh reported at 221 lbs.  Will discussed elevate her lower legs to prevent ongoing swelling. Discussed compression stockings as pt verified she has TEDs if needed.  Will verified pt has been attended all her medical appointments with no delays with her brother transporting her to and from her providers appointments. Will encouraged pt to use [facility] if needed for future transportation listed in the [facility] calendar book if needed for contacts. Will verify pt has enough medications and does not need any refills at this time. Will continued to encourage  adherence with her daily medications.  Will discussed the plan of care and verified all goals have been met at this time. Based upon pt's progress will discharged via [facility] services today as pt receptive to this discharge. Note pt has opt to decline a Engineer, maintenance and has again met all goals at this time. Pt aware to contact [facility] if services are needed in the future. No additional inquires or request at this time. Will alert pt's primary provider of today's home visit discharge.  [name], RN Care Management Coordinator [place] Office [phone]

## 2016-04-10 ENCOUNTER — Ambulatory Visit (INDEPENDENT_AMBULATORY_CARE_PROVIDER_SITE_OTHER): Payer: Medicare Other | Admitting: Internal Medicine

## 2016-04-10 ENCOUNTER — Encounter: Payer: Self-pay | Admitting: Internal Medicine

## 2016-04-10 VITALS — BP 137/60 | HR 57 | Temp 98.1°F | Resp 20 | Ht 62.0 in | Wt 219.4 lb

## 2016-04-10 DIAGNOSIS — M48061 Spinal stenosis, lumbar region without neurogenic claudication: Secondary | ICD-10-CM

## 2016-04-10 DIAGNOSIS — M4806 Spinal stenosis, lumbar region: Secondary | ICD-10-CM | POA: Diagnosis not present

## 2016-04-10 DIAGNOSIS — M25562 Pain in left knee: Secondary | ICD-10-CM

## 2016-04-10 DIAGNOSIS — M25561 Pain in right knee: Secondary | ICD-10-CM

## 2016-04-10 DIAGNOSIS — G8929 Other chronic pain: Secondary | ICD-10-CM

## 2016-04-10 DIAGNOSIS — M545 Low back pain, unspecified: Secondary | ICD-10-CM

## 2016-04-10 MED ORDER — METHYLPREDNISOLONE ACETATE 40 MG/ML IJ SUSP
40.0000 mg | Freq: Once | INTRAMUSCULAR | Status: AC
  Administered 2016-04-10: 40 mg via INTRAMUSCULAR

## 2016-04-10 MED ORDER — HYDROCODONE-ACETAMINOPHEN 10-325 MG PO TABS: 1.0000 | ORAL_TABLET | Freq: Two times a day (BID) | ORAL | Status: DC

## 2016-04-10 MED ORDER — METHYLPREDNISOLONE ACETATE 40 MG/ML IJ SUSP: 40.0000 mg | Freq: Once | INTRAMUSCULAR | Status: DC

## 2016-04-10 NOTE — Addendum Note (Signed)
Addended by: Despina Hidden on: 04/10/2016 01:44 PM   Modules accepted: Orders, Medications

## 2016-04-10 NOTE — Progress Notes (Signed)
Patient ID: Wayne Stefanick, female   DOB: [birth year], 80 y.o.   MRN: [id number]    Location:    [facility]   Place of Service:   OFFICE  Chief Complaint  Patient presents with  . Medical Management of Chronic Issues - pain f/u    HPI:  80 yo female seen today for f/u chronic pain. Fentanyl patch increased to 82mcg/h last month. She has noticed more itching and increased nausea on higher dose of patch. She stopped using it. She also has norco, voltaren gel, cymbalta and gabapentin. She ran out of norco several days ago as she takes 1 tab instead 1/2 tab BID. She had back injections in the past without relief. A1c 7.5%  Multiple joint pain - she reports diffuse arthralgias but especially in legs b/l. Pain is shooting. She also c/o pain in left shoulder that began after fall in bathroom. She hit her shoulder against faucet and bruised it. She does not feel she needs orthotic and does not feel it will help. She uses a rolling walker with seat and brakes  She has transportation issues. Her brother, who had a stroke, brings her to local appts and cannot drive further. She is not interested in riding [facility]  Past Medical History  Diagnosis Date  . Diabetes mellitus   . Hypertension   . Obese   . Hiatal hernia   . Scoliosis   . Arthritis   . Acute bronchitis   . COPD (chronic obstructive pulmonary disease) ([place])   . Unspecified hereditary and idiopathic peripheral neuropathy   . Secondary diabetes mellitus with renal manifestations, not stated as uncontrolled, or unspecified   . Chronic kidney disease, stage II (mild)   . Chronic kidney disease, stage II (mild)   . Chronic kidney disease, unspecified ([place])   . Disorder of bone and cartilage, unspecified   . Complications affecting other specified body systems, hypertension   . Hypopotassemia   . Anxiety   . Congestive heart failure, unspecified   . GERD (gastroesophageal reflux disease)   . Lumbago   . Insomnia, unspecified   . Hyperlipidemia     . Unspecified glaucoma   . Diaphragmatic hernia without mention of obstruction or gangrene   . Osteoarthrosis, unspecified whether generalized or localized, unspecified site   . Degenerative arthritis of knee, bilateral   . PONV (postoperative nausea and vomiting)     Past Surgical History  Procedure Laterality Date  . Abdominal hysterectomy    . Shoulder surgery    . Total hip arthroplasty      bilateral  . Knee arthroscopy      bilateral  . Colonoscopy  08/06/2011  . Foot surgery      Patient Care Team: [name], NP as PCP - General (Nurse Practitioner)  Social History   Social History  . Marital Status: Widowed    Spouse Name: N/A  . Number of Children: N/A  . Years of Education: N/A   Occupational History  . Retired    Social History Main Topics  . Smoking status: Former Smoker    Types: Cigarettes    Quit date: 02/27/1997  . Smokeless tobacco: Never Used  . Alcohol Use: No  . Drug Use: No  . Sexual Activity: No   Other Topics Concern  . Not on file   Social History Narrative     reports that she quit smoking about 19 years ago. Her smoking use included Cigarettes. She has never used smokeless tobacco. She  reports that she does not drink alcohol or use illicit drugs.  Allergies  Allergen Reactions  . Tramadol Other (See Comments)    Leg cramps   . Codeine Nausea And Vomiting    Patient states N/V with codeine  . Hydrocodone Nausea And Vomiting  . Oxycodone Other (See Comments)    Patient states she can tolerate oxycodone  . Tylenol [Acetaminophen] Hives  . Penicillins Rash    Patient states rash/itch with penicillin Has patient had a PCN reaction causing immediate rash, facial/tongue/throat swelling, SOB or lightheadedness with hypotension: Yes- broke me out and i was itching Has patient had a PCN reaction causing severe rash involving mucus membranes or skin necrosis: No Has patient had a PCN reaction that required hospitalization Yes- i  was already in the hospital Has patient had a PCN reaction occurring within the last 10 years: No If all of the above answers are "NO    Medications: Patient's Medications  New Prescriptions   No medications on file  Previous Medications   AMITIZA 24 MCG CAPSULE    TAKE 1 CAPSULE BY MOUTH 2 TIMES A DAY WITH MEALS   ASPIRIN EC 81 MG TABLET    Take 81 mg by mouth daily.   BUDESONIDE (PULMICORT) 0.25 MG/2ML NEBULIZER SOLUTION    Take one ampule in Neb twice daily routinely   CHOLECALCIFEROL (VITAMIN D3) 2000 UNITS TABS    Take 2,000 Units by mouth daily.   CLONIDINE (CATAPRES) 0.1 MG TABLET    TAKE 1 TABLET BY MOUTH EVERY DAY   CYCLOSPORINE (RESTASIS) 0.05 % OPHTHALMIC EMULSION    Place 1 drop into both eyes 2 (two) times daily. One drop once a day bilaterally for dry eyes   DICLOFENAC SODIUM (VOLTAREN) 1 % GEL    APPLY 4 GRAM TOPICALLY TWICE DAILY as needed for pain   DULOXETINE (CYMBALTA) 30 MG CAPSULE    TAKE 1 CAPSULE BY MOUTH EVERY DAY TO HELP ANXIETY AND PAINS   EASY TOUCH INSULIN SYRINGE 31G X 5/16" 0.5 ML MISC    USE UP TO 2 TIMES A DAY AS DIRECTED   ENALAPRIL (VASOTEC) 10 MG TABLET    TAKE 1 TABLET BY MOUTH ONCE DAILY FOR BLOOD PRESSURE AND STRENGTHEN HEART   FENTANYL (DURAGESIC - DOSED MCG/HR) 50 MCG/HR    Place 1 patch (50 mcg total) onto the skin every 3 (three) days. For pain. Remove old patch.   GABAPENTIN (NEURONTIN) 100 MG CAPSULE    TAKE 2 CAPSULES BY MOUTH THREE TIMES A DAY   GUAIFENESIN (ROBITUSSIN) 100 MG/5ML SYRUP    Take 200 mg by mouth 3 (three) times daily as needed for cough.   HYDRALAZINE (APRESOLINE) 50 MG TABLET    TAKE 1 TABLET BY MOUTH TWICE DAILY   HYDROCODONE-ACETAMINOPHEN (NORCO) 10-325 MG TABLET    Take 0.5 tablets by mouth every 8 (eight) hours.   INSULIN NPH-REGULAR HUMAN (HUMULIN 70/30) (70-30) 100 UNIT/ML INJECTION    Inject 40 units subcutaneously with breakfast and 25 units with dinner to control blood sugar.   IPRATROPIUM-ALBUTEROL (DUONEB) 0.5-2.5 (3)  MG/3ML SOLN    Take 80mls by nebulization every 6 hours as needed for SOB/Wheezing   MAGNESIUM OXIDE (MAG-OX) 400 MG TABLET    Take 1 tablet (400 mg total) by mouth 2 (two) times daily.   METOPROLOL SUCCINATE (TOPROL-XL) 25 MG 24 HR TABLET    TAKE 1 TABLET BY MOUTH EVERY DAY WITH OR IMMEDIATELY FOLLOWING A MEAL FOR BLOOD PRESSURE   NEXIUM  40 MG CAPSULE    TAKE 1 CAPSULE BY MOUTH ONCE DAILY   POLYETHYLENE GLYCOL (MIRALAX / GLYCOLAX) PACKET    DISSOLVE 17 GRAMS (1 PACKET) INTO 8 OUNCES OF LIQUID AND DRINK EVERY DAY   POTASSIUM CHLORIDE SA (K-DUR,KLOR-CON) 20 MEQ TABLET    TAKE 1 TABLET BY MOUTH EVERY DAY   SIMVASTATIN (ZOCOR) 20 MG TABLET    TAKE 1 TABLET BY MOUTH EVERY DAY   SUCRALFATE (CARAFATE) 1 G TABLET    TAKE 1 TABLET BY MOUTH TWICE DAILY ON EMPTY STOMACH   TIMOLOL (TIMOPTIC) 0.5 % OPHTHALMIC SOLUTION    INSTILL 1 DROP INTO EACH EYE TWICE A DAY   TORSEMIDE (DEMADEX) 20 MG TABLET    Take 1 tablet by mouth daily for edema.   VENTOLIN HFA 108 (90 BASE) MCG/ACT INHALER    INHALE 1 PUFF 4 TIMES A DAY FOR 5 DAYS, THEN USE EVERY 6 HOURS AS NEEDED  Modified Medications   No medications on file  Discontinued Medications   FUROSEMIDE (LASIX) 40 MG TABLET    TAKE 1 TABLET BY MOUTH TWICE A DAY   LATANOPROST (XALATAN) 0.005 % OPHTHALMIC SOLUTION    Place 1 drop into both eyes at bedtime.    Review of Systems  Gastrointestinal: Positive for nausea.  Musculoskeletal: Positive for back pain, joint swelling, arthralgias and gait problem (uses rolling walker with seat and brakes).  Psychiatric/Behavioral: Positive for sleep disturbance.  All other systems reviewed and are negative.   Filed Vitals:   04/10/16 0806  BP: 137/60  Pulse: 57  Temp: 98.1 F (36.7 C)  TempSrc: Oral  Resp: 20  Height: 5\' 2"  (1.575 m)  Weight: 219 lb 6.4 oz (99.519 kg)  SpO2: 93%   Body mass index is 40.12 kg/(m^2).  Physical Exam  Constitutional: She is oriented to person, place, and time. She appears well-developed  and well-nourished.  Musculoskeletal: She exhibits edema (b/l knee) and tenderness.  Neurological: She is alert and oriented to person, place, and time.  Psychiatric: She has a normal mood and affect. Her behavior is normal. Thought content normal.     Labs reviewed: Office Visit on 02/24/2016  Component Date Value Ref Range Status  . Hgb A1c MFr Bld 02/26/2016 7.5* 4.8 - 5.6 % Final   Comment:          Pre-diabetes: 5.7 - 6.4          Diabetes: >6.4          Glycemic control for adults with diabetes: <7.0   . Est. average glucose Bld gHb Est-m* 02/26/2016 169   Final  . Glucose 02/26/2016 132* 65 - 99 mg/dL Final  . BUN 02/26/2016 27  8 - 27 mg/dL Final  . Creatinine, Ser 02/26/2016 2.02* 0.57 - 1.00 mg/dL Final  . GFR calc non Af Amer 02/26/2016 23* >59 mL/min/1.73 Final  . GFR calc Af Amer 02/26/2016 27* >59 mL/min/1.73 Final  . BUN/Creatinine Ratio 02/26/2016 13  11 - 26 Final   Comment: **Effective March 02, 2016 BUN/Creatinine Ratio**   reference interval will be changing to:              Age                  Female          Female      0 days   -  7 days          9 - 25  9 - 26      8 days   - 30 days          8 - 32        10 - 33      1 month  -  6 months       11 - 57        11 - 54      7 months -  1 year         20 - 71        20 - 71      2 years  -  5 years        23 - 46        19 - 37      6 years  - 12 years        35 - 11        13 - 55     13 years  - 46 years        54 - 36        10 - 48     18 years  - 66 years         9 - 55         9 - 42                >59 years        10 - 24        23 - 34   . Sodium 02/26/2016 143  134 - 144 mmol/L Final  . Potassium 02/26/2016 3.4* 3.5 - 5.2 mmol/L Final  . Chloride 02/26/2016 105  96 - 106 mmol/L Final  . CO2 02/26/2016 21  18 - 29 mmol/L Final  . Calcium 02/26/2016 9.5  8.7 - 10.3 mg/dL Final  . Total Protein 02/26/2016 6.8  6.0 - 8.5 g/dL Final  . Albumin 02/26/2016 4.1  3.5 - 4.8 g/dL Final  . Globulin,  Total 02/26/2016 2.7  1.5 - 4.5 g/dL Final  . Albumin/Globulin Ratio 02/26/2016 1.5  1.2 - 2.2 Final                 **Please note reference interval change**  . Bilirubin Total 02/26/2016 <0.2  0.0 - 1.2 mg/dL Final  . Alkaline Phosphatase 02/26/2016 105  39 - 117 IU/L Final  . AST 02/26/2016 20  0 - 40 IU/L Final  . ALT 02/26/2016 7  0 - 32 IU/L Final  . Creatinine, Urine 02/26/2016 56.9  Not Estab. mg/dL Final  . Microalbum.,U,Random 02/26/2016 <3.0  Not Estab. ug/mL Final   **Verified by repeat analysis**  . MICROALB/CREAT RATIO 02/26/2016 <5.3  0.0 - 30.0 mg/g creat Final    No results found.   Assessment/Plan   ICD-9-CM ICD-10-CM   1. Chronic midline low back pain without sciatica 724.2 M54.5 HYDROcodone-acetaminophen (NORCO) 10-325 MG tablet   338.29 G89.29   2. Spinal stenosis of lumbar region 724.02 M48.06 Ambulatory referral to Pain Clinic  3. Chronic pain 338.29 G89.29 Ambulatory referral to Pain Clinic  4. Knee pain, bilateral 719.46 M25.561 Ambulatory referral to Orthopedic Surgery    M25.562    s/p b/l TKR    Refer to pain specialist  Ortho referral to determine if knee hardware needs to be replaced. Previous Ortho Dr has retired  Depo medrol 40 mg injection given today  Increase norco to 1 tab po BID  STOP fentanyl patch due to side  effects  Cont other meds as ordered  Follow up in 1 month for pain  Louiza Moor S. Perlie Gold  [facility] [address] [place], [place] [address] [phone] Cell (Monday-Friday 8 AM - 5 PM) [phone] After 5 PM and follow prompts

## 2016-04-10 NOTE — Patient Instructions (Addendum)
Will call with pain referral and Ortho referral appt  Increase norco to 1 tablet 2 times daily  STOP pain patch  Recommend she gets SCAT application  Depo medrol injection given today  Continue other medications as ordered  Follow up in 1 mont for pain

## 2016-04-14 ENCOUNTER — Other Ambulatory Visit: Payer: Self-pay | Admitting: Nurse Practitioner

## 2016-04-14 ENCOUNTER — Other Ambulatory Visit: Payer: Self-pay | Admitting: Pharmacotherapy

## 2016-04-21 DIAGNOSIS — M170 Bilateral primary osteoarthritis of knee: Secondary | ICD-10-CM | POA: Diagnosis not present

## 2016-04-21 DIAGNOSIS — M160 Bilateral primary osteoarthritis of hip: Secondary | ICD-10-CM | POA: Diagnosis not present

## 2016-04-30 ENCOUNTER — Other Ambulatory Visit: Payer: Self-pay | Admitting: Internal Medicine

## 2016-05-08 ENCOUNTER — Other Ambulatory Visit: Payer: Self-pay | Admitting: Internal Medicine

## 2016-05-08 ENCOUNTER — Other Ambulatory Visit: Payer: Self-pay

## 2016-05-08 DIAGNOSIS — G8929 Other chronic pain: Secondary | ICD-10-CM

## 2016-05-08 DIAGNOSIS — M545 Low back pain: Principal | ICD-10-CM

## 2016-05-08 MED ORDER — HYDROCODONE-ACETAMINOPHEN 10-325 MG PO TABS: 1.0000 | ORAL_TABLET | Freq: Two times a day (BID) | ORAL | Status: DC

## 2016-05-08 NOTE — Telephone Encounter (Signed)
Spoke with patient and advised rx ready for pick-up and it will be at the front desk.  

## 2016-05-13 ENCOUNTER — Other Ambulatory Visit: Payer: Self-pay | Admitting: Internal Medicine

## 2016-05-15 ENCOUNTER — Ambulatory Visit (INDEPENDENT_AMBULATORY_CARE_PROVIDER_SITE_OTHER): Payer: Medicare Other | Admitting: Internal Medicine

## 2016-05-15 ENCOUNTER — Encounter: Payer: Self-pay | Admitting: Internal Medicine

## 2016-05-15 VITALS — BP 124/70 | HR 67 | Temp 98.4°F | Ht 62.0 in | Wt 219.6 lb

## 2016-05-15 DIAGNOSIS — J441 Chronic obstructive pulmonary disease with (acute) exacerbation: Secondary | ICD-10-CM | POA: Diagnosis not present

## 2016-05-15 DIAGNOSIS — M545 Low back pain: Secondary | ICD-10-CM

## 2016-05-15 DIAGNOSIS — G8929 Other chronic pain: Secondary | ICD-10-CM | POA: Diagnosis not present

## 2016-05-15 MED ORDER — HYDROCODONE-ACETAMINOPHEN 10-325 MG PO TABS: 1.0000 | ORAL_TABLET | Freq: Three times a day (TID) | ORAL | Status: DC

## 2016-05-15 MED ORDER — LEVOFLOXACIN 500 MG PO TABS: 500.0000 mg | ORAL_TABLET | Freq: Every day | ORAL | Status: DC

## 2016-05-15 MED ORDER — BUDESONIDE 0.25 MG/2ML IN SUSP: RESPIRATORY_TRACT | Status: DC

## 2016-05-15 NOTE — Patient Instructions (Addendum)
Continue nebulizer treatments as ordered  Take all of levaquin as ordered x 10 days.  Take probiotic daily (like yogurt or activia or culturelle) to keep colon healthy while on antibiotic (levaquin)  Continue other medications as ordered  Follow up with Orthopedic as scheduled  Go to the ER if symptoms worse or persist  Follow up in 2 mos for routine visit

## 2016-05-15 NOTE — Progress Notes (Signed)
Patient ID: Michelle Buck, female   DOB: 14-Oct-1936, 80 y.o.   MRN: [id number]    Location:  [facility] Place of Service: OFFICE  Chief Complaint  Patient presents with  . Follow-up    1 month follow up for pain    HPI:  80 yo female seen today for f/u pain. She is scheduled to see Ortho next week. Pain is improved at  6/10 on scale with BID norco 10/325 but she feels she needs a dose in the middle of the day.   She c/o 3 week hx URI sx's - productive cough with interruption of sleep, CP, SOB, wheezing, HA, dizziness, b/l ear itching, sinus pressure No fever but has chills occasionally. No nausea. Reduced appetite. No sore throat. Tried OTC Robitussin without relief. No relief with honey lemon mixture. SOB not relieved with HFA.  Health maintenance - she refuses tdap and zostavax  Past Medical History  Diagnosis Date  . Diabetes mellitus   . Hypertension   . Obese   . Hiatal hernia   . Scoliosis   . Arthritis   . Acute bronchitis   . COPD (chronic obstructive pulmonary disease) ([place])   . Unspecified hereditary and idiopathic peripheral neuropathy   . Secondary diabetes mellitus with renal manifestations, not stated as uncontrolled, or unspecified   . Chronic kidney disease, stage II (mild)   . Chronic kidney disease, stage II (mild)   . Chronic kidney disease, unspecified ([place])   . Disorder of bone and cartilage, unspecified   . Complications affecting other specified body systems, hypertension   . Hypopotassemia   . Anxiety   . Congestive heart failure, unspecified   . GERD (gastroesophageal reflux disease)   . Lumbago   . Insomnia, unspecified   . Hyperlipidemia   . Unspecified glaucoma   . Diaphragmatic hernia without mention of obstruction or gangrene   . Osteoarthrosis, unspecified whether generalized or localized, unspecified site   . Degenerative arthritis of knee, bilateral   . PONV (postoperative nausea and vomiting)     Past Surgical History  Procedure Laterality  Date  . Abdominal hysterectomy    . Shoulder surgery    . Total hip arthroplasty      bilateral  . Knee arthroscopy      bilateral  . Colonoscopy  08/06/2011  . Foot surgery      Patient Care Team: [name], NP as PCP - General (Nurse Practitioner)  Social History   Social History  . Marital Status: Widowed    Spouse Name: N/A  . Number of Children: N/A  . Years of Education: N/A   Occupational History  . Retired    Social History Main Topics  . Smoking status: Former Smoker    Types: Cigarettes    Quit date: 02/27/1997  . Smokeless tobacco: Never Used  . Alcohol Use: No  . Drug Use: No  . Sexual Activity: No   Other Topics Concern  . Not on file   Social History Narrative     reports that she quit smoking about 19 years ago. Her smoking use included Cigarettes. She has never used smokeless tobacco. She reports that she does not drink alcohol or use illicit drugs.  Family History  Problem Relation Age of Onset  . Diabetes Mother   . Heart disease Mother   . Heart disease Brother   . Diabetes Brother   . Kidney disease Father   . Scoliosis Sister   . Diabetes Brother   .  Diabetes Brother   . Heart disease Brother   . Scoliosis Brother   . Stroke Brother   . Heart attack Brother    Family Status  Relation Status Death Age  . Mother Deceased   . Brother Deceased   . Father Deceased   . Sister Alive   . Brother Alive   . Brother Alive   . Brother Alive   . Brother Alive   . Son Deceased      Allergies  Allergen Reactions  . Tramadol Other (See Comments)    Leg cramps   . Codeine Nausea And Vomiting    Patient states N/V with codeine  . Hydrocodone Nausea And Vomiting  . Oxycodone Other (See Comments)    Patient states she can tolerate oxycodone  . Tylenol [Acetaminophen] Hives  . Penicillins Rash    Patient states rash/itch with penicillin Has patient had a PCN reaction causing immediate rash, facial/tongue/throat swelling, SOB or  lightheadedness with hypotension: Yes- broke me out and i was itching Has patient had a PCN reaction causing severe rash involving mucus membranes or skin necrosis: No Has patient had a PCN reaction that required hospitalization Yes- i was already in the hospital Has patient had a PCN reaction occurring within the last 10 years: No If all of the above answers are "NO    Medications: Patient's Medications  New Prescriptions   No medications on file  Previous Medications   ALBUTEROL (PROVENTIL HFA;VENTOLIN HFA) 108 (90 BASE) MCG/ACT INHALER    Inhale 2 puffs into the lungs every 4 (four) hours as needed for wheezing or shortness of breath.   AMITIZA 24 MCG CAPSULE    TAKE 1 CAPSULE BY MOUTH 2 TIMES A DAY WITH MEALS   ASPIRIN EC 81 MG TABLET    Take 81 mg by mouth daily.   BUDESONIDE (PULMICORT) 0.25 MG/2ML NEBULIZER SOLUTION    Take one ampule in Neb twice daily routinely   CHOLECALCIFEROL (VITAMIN D3) 2000 UNITS TABS    Take 2,000 Units by mouth daily.   CILOSTAZOL (PLETAL) 100 MG TABLET    Take 100 mg by mouth daily.   CLONIDINE (CATAPRES) 0.1 MG TABLET    TAKE 1 TABLET BY MOUTH EVERY DAY   CYCLOSPORINE (RESTASIS) 0.05 % OPHTHALMIC EMULSION    Place 1 drop into both eyes 2 (two) times daily. One drop once a day bilaterally for dry eyes   DICLOFENAC SODIUM (VOLTAREN) 1 % GEL    APPLY 4 GRAM TOPICALLY TWICE DAILY as needed for pain   DULOXETINE (CYMBALTA) 30 MG CAPSULE    TAKE 1 CAPSULE BY MOUTH EVERY DAY TO HELP ANXIETY AND PAINS   EASY TOUCH INSULIN SYRINGE 31G X 5/16" 0.5 ML MISC    USE UP TO 2 TIMES A DAY AS DIRECTED   ENALAPRIL (VASOTEC) 10 MG TABLET    TAKE 1 TABLET BY MOUTH ONCE DAILY FOR BLOOD PRESSURE AND STRENGTHEN HEART   GABAPENTIN (NEURONTIN) 100 MG CAPSULE    TAKE 2 CAPSULES BY MOUTH THREE TIMES A DAY   GUAIFENESIN (ROBITUSSIN) 100 MG/5ML SYRUP    Take 200 mg by mouth 3 (three) times daily as needed for cough.   HUMULIN 70/30 (70-30) 100 UNIT/ML INJECTION    INJECT 40 UNITS  SUBCUTANEOUSLY WITH BREAKFAST AND 20 UNITS SUBCUTANEOUSLY WITH DINNER TO CONTROL BLOOD SUGAR.   HYDRALAZINE (APRESOLINE) 50 MG TABLET    TAKE 1 TABLET BY MOUTH TWICE DAILY   HYDROCODONE-ACETAMINOPHEN (NORCO) 10-325 MG TABLET  Take 1 tablet by mouth 2 (two) times daily.   INSULIN NPH-REGULAR HUMAN (HUMULIN 70/30) (70-30) 100 UNIT/ML INJECTION    Inject 40 units subcutaneously with breakfast and 25 units with dinner to control blood sugar.   IPRATROPIUM-ALBUTEROL (DUONEB) 0.5-2.5 (3) MG/3ML SOLN    Take 61mls by nebulization every 6 hours as needed for SOB/Wheezing   MAGNESIUM OXIDE (MAG-OX) 400 MG TABLET    Take 1 tablet (400 mg total) by mouth 2 (two) times daily.   METOPROLOL SUCCINATE (TOPROL-XL) 25 MG 24 HR TABLET    TAKE 1 TABLET BY MOUTH EVERY DAY WITH OR IMMEDIATELY FOLLOWING A MEAL FOR BLOOD PRESSURE   NEXIUM 40 MG CAPSULE    TAKE 1 CAPSULE BY MOUTH ONCE DAILY   POLYETHYLENE GLYCOL (MIRALAX / GLYCOLAX) PACKET    DISSOLVE 17 GRAMS (1 PACKET) INTO 8 OUNCES OF LIQUID AND DRINK EVERY DAY   POTASSIUM CHLORIDE SA (K-DUR,KLOR-CON) 20 MEQ TABLET    TAKE 1 TABLET BY MOUTH EVERY DAY   SERTRALINE (ZOLOFT) 50 MG TABLET    TAKE 1 TABLET BY MOUTH EVERY DAY   SIMVASTATIN (ZOCOR) 20 MG TABLET    TAKE 1 TABLET BY MOUTH EVERY DAY   SUCRALFATE (CARAFATE) 1 G TABLET    TAKE 1 TABLET BY MOUTH TWICE DAILY ON EMPTY STOMACH.   TIMOLOL (TIMOPTIC) 0.5 % OPHTHALMIC SOLUTION    INSTILL 1 DROP INTO EACH EYE TWICE A DAY   TORSEMIDE (DEMADEX) 20 MG TABLET    Take 1 tablet by mouth daily for edema.   TRAVATAN Z 0.004 % SOLN OPHTHALMIC SOLUTION    1 drop daily.  Modified Medications   No medications on file  Discontinued Medications   CILOSTAZOL (PLETAL) 100 MG TABLET    TAKE 1 TABLET BY MOUTH EVERY DAY   VENTOLIN HFA 108 (90 BASE) MCG/ACT INHALER    INHALE 1 PUFF 4 TIMES A DAY FOR 5 DAYS, THEN USE EVERY 6 HOURS AS NEEDED   VENTOLIN HFA 108 (90 BASE) MCG/ACT INHALER    INHALE 1 PUFF 4 TIMES A DAY FOR 5 DAYS, THEN USE  EVERY 6 HOURS AS NEEDED    Review of Systems  HENT: Positive for sinus pressure.   Respiratory: Positive for cough, shortness of breath and wheezing.   Musculoskeletal: Positive for arthralgias.  Neurological: Positive for dizziness and headaches.  Psychiatric/Behavioral: Positive for sleep disturbance.  All other systems reviewed and are negative.   Filed Vitals:   05/15/16 1314  BP: 124/70  Pulse: 67  Temp: 98.4 F (36.9 C)  TempSrc: Oral  Height: 5\' 2"  (1.575 m)  Weight: 219 lb 9.6 oz (99.61 kg)  SpO2: 90%   Body mass index is 40.16 kg/(m^2).  Physical Exam  Constitutional: She is oriented to person, place, and time. She appears well-developed and well-nourished.  HENT:  Mouth/Throat: Oropharynx is clear and moist. No oropharyngeal exudate.  TMs appear nml. Maxillary sinus TTP with boggy tissue texture changes  Eyes: Pupils are equal, round, and reactive to light. No scleral icterus.  Neck: Neck supple. Carotid bruit is not present. No tracheal deviation present. No thyromegaly present.  Cardiovascular: Normal rate, regular rhythm, normal heart sounds and intact distal pulses.  Exam reveals no gallop and no friction rub.   No murmur heard. +1 pitting LE edema b/l. no calf TTP.   Pulmonary/Chest: Effort normal. No stridor. No respiratory distress. She has wheezes (b/l end expiratory withprolonged expiratory phase). She has no rales. She exhibits tenderness.  Poor inspiratory  effort  Abdominal: Soft. Bowel sounds are normal. She exhibits no distension and no mass. There is no hepatomegaly. There is tenderness (epigastric TTP). There is no rebound and no guarding.  Musculoskeletal: She exhibits edema and tenderness.  Uses rolling walker  Lymphadenopathy:    She has no cervical adenopathy.  Neurological: She is alert and oriented to person, place, and time.  Skin: Skin is warm and dry. No rash noted.  Psychiatric: She has a normal mood and affect. Her behavior is normal.  Thought content normal.     Labs reviewed: Office Visit on 02/24/2016  Component Date Value Ref Range Status  . Hgb A1c MFr Bld 02/26/2016 7.5* 4.8 - 5.6 % Final   Comment:          Pre-diabetes: 5.7 - 6.4          Diabetes: >6.4          Glycemic control for adults with diabetes: <7.0   . Est. average glucose Bld gHb Est-m* 02/26/2016 169   Final  . Glucose 02/26/2016 132* 65 - 99 mg/dL Final  . BUN 02/26/2016 27  8 - 27 mg/dL Final  . Creatinine, Ser 02/26/2016 2.02* 0.57 - 1.00 mg/dL Final  . GFR calc non Af Amer 02/26/2016 23* >59 mL/min/1.73 Final  . GFR calc Af Amer 02/26/2016 27* >59 mL/min/1.73 Final  . BUN/Creatinine Ratio 02/26/2016 13  11 - 26 Final   Comment: **Effective March 02, 2016 BUN/Creatinine Ratio**   reference interval will be changing to:              Age                  Female          Female      0 days   -  7 days          9 - 25         9 - 26      8 days   - 30 days          8 - 8        10 - 33      1 month  -  6 months       11 - 63        11 - 66      7 months -  1 year         20 - 74        20 - 43      2 years  -  5 years        5 - 81        19 - 28      6 years  - 67 years        26 - 78        13 - 79     13 years  - 59 years        36 - 66        10 - 69     18 years  - 88 years         2 - 46         9 - 72                >59 years        10 - 22        12 - 11   .  Sodium 02/26/2016 143  134 - 144 mmol/L Final  . Potassium 02/26/2016 3.4* 3.5 - 5.2 mmol/L Final  . Chloride 02/26/2016 105  96 - 106 mmol/L Final  . CO2 02/26/2016 21  18 - 29 mmol/L Final  . Calcium 02/26/2016 9.5  8.7 - 10.3 mg/dL Final  . Total Protein 02/26/2016 6.8  6.0 - 8.5 g/dL Final  . Albumin 02/26/2016 4.1  3.5 - 4.8 g/dL Final  . Globulin, Total 02/26/2016 2.7  1.5 - 4.5 g/dL Final  . Albumin/Globulin Ratio 02/26/2016 1.5  1.2 - 2.2 Final                 **Please note reference interval change**  . Bilirubin Total 02/26/2016 <0.2  0.0 - 1.2 mg/dL Final  .  Alkaline Phosphatase 02/26/2016 105  39 - 117 IU/L Final  . AST 02/26/2016 20  0 - 40 IU/L Final  . ALT 02/26/2016 7  0 - 32 IU/L Final  . Creatinine, Urine 02/26/2016 56.9  Not Estab. mg/dL Final  . Microalbum.,U,Random 02/26/2016 <3.0  Not Estab. ug/mL Final   **Verified by repeat analysis**  . MICROALB/CREAT RATIO 02/26/2016 <5.3  0.0 - 30.0 mg/g creat Final    No results found.   Assessment/Plan   ICD-9-CM ICD-10-CM   1. COPD exacerbation (HCC) 491.21 J44.1 levofloxacin (LEVAQUIN) 500 MG tablet  2. Chronic pain 338.29 G89.29   3. Chronic midline low back pain without sciatica 724.2 M54.5 HYDROcodone-acetaminophen (NORCO) 10-325 MG tablet   338.29 G89.29    Continue nebulizer treatments as ordered  Take all of levaquin as ordered x 10 days.  Take probiotic daily (like yogurt or activia or culturelle) to keep colon healthy while on antibiotic (levaquin)  Continue other medications as ordered  Follow up with Orthopedic as scheduled  Go to the ER if symptoms worse or persist  Follow up in 2 mos for routine visit  Kaneshia Cater S. Perlie Gold  [facility] [address] [place], [place] [address] [phone] Cell (Monday-Friday 8 AM - 5 PM) [phone] After 5 PM and follow prompts

## 2016-05-21 ENCOUNTER — Emergency Department (HOSPITAL_COMMUNITY): Payer: Medicare Other

## 2016-05-21 ENCOUNTER — Inpatient Hospital Stay (HOSPITAL_COMMUNITY)
Admission: EM | Admit: 2016-05-21 | Discharge: 2016-05-25 | DRG: 190 | Disposition: A | Discharge status: Home Health | Payer: Medicare Other | Attending: Internal Medicine | Admitting: Internal Medicine

## 2016-05-21 ENCOUNTER — Other Ambulatory Visit: Payer: Self-pay

## 2016-05-21 ENCOUNTER — Encounter (HOSPITAL_COMMUNITY): Payer: Self-pay

## 2016-05-21 DIAGNOSIS — R0602 Shortness of breath: Secondary | ICD-10-CM | POA: Diagnosis not present

## 2016-05-21 DIAGNOSIS — D72829 Elevated white blood cell count, unspecified: Secondary | ICD-10-CM | POA: Diagnosis present

## 2016-05-21 DIAGNOSIS — M545 Low back pain, unspecified: Secondary | ICD-10-CM | POA: Diagnosis present

## 2016-05-21 DIAGNOSIS — M419 Scoliosis, unspecified: Secondary | ICD-10-CM | POA: Diagnosis present

## 2016-05-21 DIAGNOSIS — N289 Disorder of kidney and ureter, unspecified: Secondary | ICD-10-CM | POA: Diagnosis not present

## 2016-05-21 DIAGNOSIS — E785 Hyperlipidemia, unspecified: Secondary | ICD-10-CM | POA: Diagnosis present

## 2016-05-21 DIAGNOSIS — J9620 Acute and chronic respiratory failure, unspecified whether with hypoxia or hypercapnia: Secondary | ICD-10-CM | POA: Diagnosis present

## 2016-05-21 DIAGNOSIS — Z888 Allergy status to other drugs, medicaments and biological substances status: Secondary | ICD-10-CM

## 2016-05-21 DIAGNOSIS — K219 Gastro-esophageal reflux disease without esophagitis: Secondary | ICD-10-CM | POA: Diagnosis present

## 2016-05-21 DIAGNOSIS — Z6841 Body Mass Index (BMI) 40.0 and over, adult: Secondary | ICD-10-CM

## 2016-05-21 DIAGNOSIS — N184 Chronic kidney disease, stage 4 (severe): Secondary | ICD-10-CM | POA: Diagnosis not present

## 2016-05-21 DIAGNOSIS — E1121 Type 2 diabetes mellitus with diabetic nephropathy: Secondary | ICD-10-CM | POA: Diagnosis present

## 2016-05-21 DIAGNOSIS — Z87891 Personal history of nicotine dependence: Secondary | ICD-10-CM | POA: Diagnosis not present

## 2016-05-21 DIAGNOSIS — F329 Major depressive disorder, single episode, unspecified: Secondary | ICD-10-CM | POA: Diagnosis present

## 2016-05-21 DIAGNOSIS — E669 Obesity, unspecified: Secondary | ICD-10-CM

## 2016-05-21 DIAGNOSIS — E1142 Type 2 diabetes mellitus with diabetic polyneuropathy: Secondary | ICD-10-CM | POA: Diagnosis present

## 2016-05-21 DIAGNOSIS — K449 Diaphragmatic hernia without obstruction or gangrene: Secondary | ICD-10-CM | POA: Diagnosis present

## 2016-05-21 DIAGNOSIS — J441 Chronic obstructive pulmonary disease with (acute) exacerbation: Secondary | ICD-10-CM | POA: Diagnosis not present

## 2016-05-21 DIAGNOSIS — I10 Essential (primary) hypertension: Secondary | ICD-10-CM | POA: Diagnosis present

## 2016-05-21 DIAGNOSIS — I739 Peripheral vascular disease, unspecified: Secondary | ICD-10-CM | POA: Diagnosis present

## 2016-05-21 DIAGNOSIS — M170 Bilateral primary osteoarthritis of knee: Secondary | ICD-10-CM | POA: Diagnosis present

## 2016-05-21 DIAGNOSIS — Z96643 Presence of artificial hip joint, bilateral: Secondary | ICD-10-CM | POA: Diagnosis present

## 2016-05-21 DIAGNOSIS — Z96653 Presence of artificial knee joint, bilateral: Secondary | ICD-10-CM | POA: Diagnosis present

## 2016-05-21 DIAGNOSIS — E1169 Type 2 diabetes mellitus with other specified complication: Secondary | ICD-10-CM

## 2016-05-21 DIAGNOSIS — I2510 Atherosclerotic heart disease of native coronary artery without angina pectoris: Secondary | ICD-10-CM | POA: Diagnosis present

## 2016-05-21 DIAGNOSIS — E1151 Type 2 diabetes mellitus with diabetic peripheral angiopathy without gangrene: Secondary | ICD-10-CM | POA: Diagnosis present

## 2016-05-21 DIAGNOSIS — Z794 Long term (current) use of insulin: Secondary | ICD-10-CM

## 2016-05-21 DIAGNOSIS — M25559 Pain in unspecified hip: Secondary | ICD-10-CM | POA: Diagnosis present

## 2016-05-21 DIAGNOSIS — Z8249 Family history of ischemic heart disease and other diseases of the circulatory system: Secondary | ICD-10-CM

## 2016-05-21 DIAGNOSIS — J9621 Acute and chronic respiratory failure with hypoxia: Secondary | ICD-10-CM | POA: Diagnosis present

## 2016-05-21 DIAGNOSIS — R05 Cough: Secondary | ICD-10-CM | POA: Diagnosis not present

## 2016-05-21 DIAGNOSIS — Z823 Family history of stroke: Secondary | ICD-10-CM

## 2016-05-21 DIAGNOSIS — Z79899 Other long term (current) drug therapy: Secondary | ICD-10-CM

## 2016-05-21 DIAGNOSIS — H409 Unspecified glaucoma: Secondary | ICD-10-CM | POA: Diagnosis present

## 2016-05-21 DIAGNOSIS — E1143 Type 2 diabetes mellitus with diabetic autonomic (poly)neuropathy: Secondary | ICD-10-CM | POA: Diagnosis present

## 2016-05-21 DIAGNOSIS — F419 Anxiety disorder, unspecified: Secondary | ICD-10-CM | POA: Diagnosis present

## 2016-05-21 DIAGNOSIS — T380X5A Adverse effect of glucocorticoids and synthetic analogues, initial encounter: Secondary | ICD-10-CM | POA: Diagnosis present

## 2016-05-21 DIAGNOSIS — I5033 Acute on chronic diastolic (congestive) heart failure: Secondary | ICD-10-CM | POA: Diagnosis not present

## 2016-05-21 DIAGNOSIS — Z833 Family history of diabetes mellitus: Secondary | ICD-10-CM

## 2016-05-21 DIAGNOSIS — Z880 Allergy status to penicillin: Secondary | ICD-10-CM

## 2016-05-21 DIAGNOSIS — Z885 Allergy status to narcotic agent status: Secondary | ICD-10-CM

## 2016-05-21 DIAGNOSIS — Z7982 Long term (current) use of aspirin: Secondary | ICD-10-CM

## 2016-05-21 DIAGNOSIS — H9192 Unspecified hearing loss, left ear: Secondary | ICD-10-CM | POA: Diagnosis present

## 2016-05-21 DIAGNOSIS — E1122 Type 2 diabetes mellitus with diabetic chronic kidney disease: Secondary | ICD-10-CM | POA: Diagnosis present

## 2016-05-21 DIAGNOSIS — R079 Chest pain, unspecified: Secondary | ICD-10-CM | POA: Diagnosis not present

## 2016-05-21 DIAGNOSIS — Z841 Family history of disorders of kidney and ureter: Secondary | ICD-10-CM

## 2016-05-21 DIAGNOSIS — D638 Anemia in other chronic diseases classified elsewhere: Secondary | ICD-10-CM | POA: Diagnosis present

## 2016-05-21 DIAGNOSIS — I130 Hypertensive heart and chronic kidney disease with heart failure and stage 1 through stage 4 chronic kidney disease, or unspecified chronic kidney disease: Secondary | ICD-10-CM | POA: Diagnosis present

## 2016-05-21 DIAGNOSIS — Z7951 Long term (current) use of inhaled steroids: Secondary | ICD-10-CM

## 2016-05-21 LAB — I-STAT CHEM 8, ED
BUN: 27 mg/dL — AB (ref 6–20)
CALCIUM ION: 1.14 mmol/L (ref 1.13–1.30)
CHLORIDE: 105 mmol/L (ref 101–111)
CREATININE: 2.3 mg/dL — AB (ref 0.44–1.00)
Glucose, Bld: 126 mg/dL — ABNORMAL HIGH (ref 65–99)
HEMATOCRIT: 36 % (ref 36.0–46.0)
Hemoglobin: 12.2 g/dL (ref 12.0–15.0)
Potassium: 3.6 mmol/L (ref 3.5–5.1)
Sodium: 144 mmol/L (ref 135–145)
TCO2: 27 mmol/L (ref 0–100)

## 2016-05-21 LAB — I-STAT TROPONIN, ED: Troponin i, poc: 0.02 ng/mL (ref 0.00–0.08)

## 2016-05-21 LAB — CBC WITH DIFFERENTIAL/PLATELET
Basophils Absolute: 0 10*3/uL (ref 0.0–0.1)
Basophils Relative: 1 %
Eosinophils Absolute: 0.7 10*3/uL (ref 0.0–0.7)
Eosinophils Relative: 9 %
HEMATOCRIT: 33.6 % — AB (ref 36.0–46.0)
HEMOGLOBIN: 10.8 g/dL — AB (ref 12.0–15.0)
LYMPHS ABS: 2.1 10*3/uL (ref 0.7–4.0)
Lymphocytes Relative: 26 %
MCH: 26 pg (ref 26.0–34.0)
MCHC: 32.1 g/dL (ref 30.0–36.0)
MCV: 81 fL (ref 78.0–100.0)
MONOS PCT: 8 %
Monocytes Absolute: 0.7 10*3/uL (ref 0.1–1.0)
NEUTROS ABS: 4.5 10*3/uL (ref 1.7–7.7)
NEUTROS PCT: 56 %
Platelets: 255 10*3/uL (ref 150–400)
RBC: 4.15 MIL/uL (ref 3.87–5.11)
RDW: 16.7 % — ABNORMAL HIGH (ref 11.5–15.5)
WBC: 8.1 10*3/uL (ref 4.0–10.5)

## 2016-05-21 LAB — BRAIN NATRIURETIC PEPTIDE: B Natriuretic Peptide: 41.5 pg/mL (ref 0.0–100.0)

## 2016-05-21 MED ORDER — ALBUTEROL SULFATE (2.5 MG/3ML) 0.083% IN NEBU
2.5000 mg | INHALATION_SOLUTION | RESPIRATORY_TRACT | Status: DC | PRN
Start: 2016-05-21 — End: 2016-05-21
  Administered 2016-05-21: 2.5 mg via RESPIRATORY_TRACT

## 2016-05-21 MED ORDER — HYDRALAZINE HCL 50 MG PO TABS
50.0000 mg | ORAL_TABLET | Freq: Two times a day (BID) | ORAL | Status: DC
  Administered 2016-05-21 – 2016-05-25 (×8): 50 mg via ORAL
  Filled 2016-05-21 (×8): qty 1

## 2016-05-21 MED ORDER — HYDROCODONE-ACETAMINOPHEN 10-325 MG PO TABS
1.0000 | ORAL_TABLET | Freq: Three times a day (TID) | ORAL | Status: DC | PRN
  Administered 2016-05-22 – 2016-05-25 (×10): 1 via ORAL
  Filled 2016-05-21 (×11): qty 1

## 2016-05-21 MED ORDER — POTASSIUM CHLORIDE CRYS ER 20 MEQ PO TBCR
20.0000 meq | EXTENDED_RELEASE_TABLET | Freq: Every day | ORAL | Status: DC
  Administered 2016-05-22 – 2016-05-25 (×4): 20 meq via ORAL
  Filled 2016-05-21 (×4): qty 1

## 2016-05-21 MED ORDER — HYDROCODONE-ACETAMINOPHEN 5-325 MG PO TABS
1.0000 | ORAL_TABLET | Freq: Once | ORAL | Status: AC
  Administered 2016-05-21: 1 via ORAL
  Filled 2016-05-21: qty 1

## 2016-05-21 MED ORDER — DULOXETINE HCL 30 MG PO CPEP
30.0000 mg | ORAL_CAPSULE | Freq: Every day | ORAL | Status: DC
  Administered 2016-05-22 – 2016-05-25 (×4): 30 mg via ORAL
  Filled 2016-05-21 (×4): qty 1

## 2016-05-21 MED ORDER — LUBIPROSTONE 24 MCG PO CAPS
24.0000 ug | ORAL_CAPSULE | Freq: Two times a day (BID) | ORAL | Status: DC
  Administered 2016-05-22 – 2016-05-25 (×7): 24 ug via ORAL
  Filled 2016-05-21 (×9): qty 1

## 2016-05-21 MED ORDER — DOXYCYCLINE HYCLATE 100 MG PO TABS
100.0000 mg | ORAL_TABLET | Freq: Two times a day (BID) | ORAL | Status: DC
  Administered 2016-05-21 – 2016-05-25 (×8): 100 mg via ORAL
  Filled 2016-05-21 (×8): qty 1

## 2016-05-21 MED ORDER — ENOXAPARIN SODIUM 30 MG/0.3ML ~~LOC~~ SOLN
30.0000 mg | SUBCUTANEOUS | Status: DC
  Administered 2016-05-22 – 2016-05-25 (×4): 30 mg via SUBCUTANEOUS
  Filled 2016-05-21 (×4): qty 0.3

## 2016-05-21 MED ORDER — ALUM & MAG HYDROXIDE-SIMETH 200-200-20 MG/5ML PO SUSP
30.0000 mL | Freq: Once | ORAL | Status: AC
Start: 2016-05-21 — End: 2016-05-21
  Administered 2016-05-21: 30 mL via ORAL
  Filled 2016-05-21: qty 30

## 2016-05-21 MED ORDER — IPRATROPIUM-ALBUTEROL 0.5-2.5 (3) MG/3ML IN SOLN
3.0000 mL | RESPIRATORY_TRACT | Status: DC
  Administered 2016-05-21: 3 mL via RESPIRATORY_TRACT
  Filled 2016-05-21: qty 3

## 2016-05-21 MED ORDER — PANTOPRAZOLE SODIUM 40 MG PO TBEC
40.0000 mg | DELAYED_RELEASE_TABLET | Freq: Every day | ORAL | Status: DC
  Administered 2016-05-22 – 2016-05-25 (×4): 40 mg via ORAL
  Filled 2016-05-21 (×4): qty 1

## 2016-05-21 MED ORDER — SERTRALINE HCL 50 MG PO TABS
50.0000 mg | ORAL_TABLET | Freq: Every day | ORAL | Status: DC
  Administered 2016-05-22 – 2016-05-25 (×4): 50 mg via ORAL
  Filled 2016-05-21 (×4): qty 1

## 2016-05-21 MED ORDER — CILOSTAZOL 100 MG PO TABS
100.0000 mg | ORAL_TABLET | Freq: Every day | ORAL | Status: DC
  Administered 2016-05-22 – 2016-05-25 (×4): 100 mg via ORAL
  Filled 2016-05-21 (×4): qty 1

## 2016-05-21 MED ORDER — SUCRALFATE 1 G PO TABS
1.0000 g | ORAL_TABLET | Freq: Two times a day (BID) | ORAL | Status: DC
  Administered 2016-05-21 – 2016-05-25 (×8): 1 g via ORAL
  Filled 2016-05-21 (×8): qty 1

## 2016-05-21 MED ORDER — IPRATROPIUM-ALBUTEROL 0.5-2.5 (3) MG/3ML IN SOLN
3.0000 mL | Freq: Four times a day (QID) | RESPIRATORY_TRACT | Status: DC
  Administered 2016-05-22 – 2016-05-24 (×9): 3 mL via RESPIRATORY_TRACT
  Filled 2016-05-21 (×9): qty 3

## 2016-05-21 MED ORDER — GUAIFENESIN 100 MG/5ML PO SYRP
200.0000 mg | ORAL_SOLUTION | Freq: Three times a day (TID) | ORAL | Status: DC | PRN
  Filled 2016-05-21: qty 10

## 2016-05-21 MED ORDER — GABAPENTIN 100 MG PO CAPS
200.0000 mg | ORAL_CAPSULE | Freq: Three times a day (TID) | ORAL | Status: DC
Start: 2016-05-21 — End: 2016-05-25
  Administered 2016-05-21 – 2016-05-25 (×11): 200 mg via ORAL
  Filled 2016-05-21 (×11): qty 2

## 2016-05-21 MED ORDER — SIMVASTATIN 20 MG PO TABS
20.0000 mg | ORAL_TABLET | Freq: Every day | ORAL | Status: DC
  Administered 2016-05-22 – 2016-05-25 (×4): 20 mg via ORAL
  Filled 2016-05-21: qty 1
  Filled 2016-05-21 (×2): qty 2
  Filled 2016-05-21: qty 1
  Filled 2016-05-21: qty 2
  Filled 2016-05-21: qty 1
  Filled 2016-05-21: qty 2
  Filled 2016-05-21: qty 1

## 2016-05-21 MED ORDER — SODIUM CHLORIDE 0.9% FLUSH
3.0000 mL | Freq: Two times a day (BID) | INTRAVENOUS | Status: DC
  Administered 2016-05-21 – 2016-05-25 (×8): 3 mL via INTRAVENOUS

## 2016-05-21 MED ORDER — ALBUTEROL SULFATE (2.5 MG/3ML) 0.083% IN NEBU
2.5000 mg | INHALATION_SOLUTION | RESPIRATORY_TRACT | Status: DC | PRN
Start: 2016-05-21 — End: 2016-05-25
  Administered 2016-05-23: 2.5 mg via RESPIRATORY_TRACT
  Filled 2016-05-21 (×2): qty 3

## 2016-05-21 MED ORDER — CYCLOSPORINE 0.05 % OP EMUL
1.0000 [drp] | Freq: Two times a day (BID) | OPHTHALMIC | Status: DC
  Administered 2016-05-21 – 2016-05-25 (×7): 1 [drp] via OPHTHALMIC
  Filled 2016-05-21 (×9): qty 1

## 2016-05-21 MED ORDER — INSULIN ASPART PROT & ASPART (70-30 MIX) 100 UNIT/ML ~~LOC~~ SUSP
25.0000 [IU] | Freq: Every day | SUBCUTANEOUS | Status: DC
  Administered 2016-05-22 – 2016-05-24 (×3): 25 [IU] via SUBCUTANEOUS
  Filled 2016-05-21: qty 10

## 2016-05-21 MED ORDER — TIMOLOL MALEATE 0.5 % OP SOLN
1.0000 [drp] | Freq: Two times a day (BID) | OPHTHALMIC | Status: DC
  Administered 2016-05-22 – 2016-05-25 (×4): 1 [drp] via OPHTHALMIC
  Filled 2016-05-21: qty 5

## 2016-05-21 MED ORDER — METOPROLOL SUCCINATE ER 25 MG PO TB24
25.0000 mg | ORAL_TABLET | Freq: Every day | ORAL | Status: DC
  Administered 2016-05-22 – 2016-05-25 (×4): 25 mg via ORAL
  Filled 2016-05-21 (×4): qty 1

## 2016-05-21 MED ORDER — INSULIN ASPART PROT & ASPART (70-30 MIX) 100 UNIT/ML ~~LOC~~ SUSP
40.0000 [IU] | Freq: Every day | SUBCUTANEOUS | Status: DC
  Administered 2016-05-22 – 2016-05-24 (×3): 40 [IU] via SUBCUTANEOUS
  Filled 2016-05-21: qty 10

## 2016-05-21 MED ORDER — PREDNISONE 20 MG PO TABS
60.0000 mg | ORAL_TABLET | Freq: Once | ORAL | Status: AC
  Administered 2016-05-21: 60 mg via ORAL
  Filled 2016-05-21: qty 3

## 2016-05-21 MED ORDER — SENNA 8.6 MG PO TABS
1.0000 | ORAL_TABLET | Freq: Two times a day (BID) | ORAL | Status: DC
  Administered 2016-05-21 – 2016-05-25 (×8): 8.6 mg via ORAL
  Filled 2016-05-21 (×8): qty 1

## 2016-05-21 MED ORDER — ALBUTEROL (5 MG/ML) CONTINUOUS INHALATION SOLN
10.0000 mg/h | INHALATION_SOLUTION | Freq: Once | RESPIRATORY_TRACT | Status: DC
  Filled 2016-05-21: qty 20

## 2016-05-21 MED ORDER — CLONIDINE HCL 0.1 MG PO TABS
0.1000 mg | ORAL_TABLET | Freq: Every day | ORAL | Status: DC
  Administered 2016-05-22 – 2016-05-25 (×4): 0.1 mg via ORAL
  Filled 2016-05-21 (×4): qty 1

## 2016-05-21 MED ORDER — ASPIRIN EC 81 MG PO TBEC
81.0000 mg | DELAYED_RELEASE_TABLET | Freq: Every day | ORAL | Status: DC
  Administered 2016-05-22 – 2016-05-25 (×4): 81 mg via ORAL
  Filled 2016-05-21 (×4): qty 1

## 2016-05-21 MED ORDER — ALBUTEROL (5 MG/ML) CONTINUOUS INHALATION SOLN
10.0000 mg/h | INHALATION_SOLUTION | Freq: Once | RESPIRATORY_TRACT | Status: AC
  Administered 2016-05-21: 10 mg/h via RESPIRATORY_TRACT
  Filled 2016-05-21: qty 20

## 2016-05-21 MED ORDER — LATANOPROST 0.005 % OP SOLN
1.0000 [drp] | Freq: Every day | OPHTHALMIC | Status: DC
  Administered 2016-05-21 – 2016-05-24 (×4): 1 [drp] via OPHTHALMIC
  Filled 2016-05-21: qty 2.5

## 2016-05-21 MED ORDER — POLYETHYLENE GLYCOL 3350 17 G PO PACK: 17.0000 g | PACK | Freq: Every day | ORAL | Status: DC | PRN

## 2016-05-21 MED ORDER — ALBUTEROL SULFATE (2.5 MG/3ML) 0.083% IN NEBU
2.5000 mg | INHALATION_SOLUTION | RESPIRATORY_TRACT | Status: DC
  Filled 2016-05-21: qty 3

## 2016-05-21 MED ORDER — METHYLPREDNISOLONE SODIUM SUCC 125 MG IJ SOLR
60.0000 mg | Freq: Two times a day (BID) | INTRAMUSCULAR | Status: DC
  Administered 2016-05-21 – 2016-05-24 (×6): 60 mg via INTRAVENOUS
  Filled 2016-05-21 (×7): qty 2

## 2016-05-21 MED ORDER — DOCUSATE SODIUM 100 MG PO CAPS
100.0000 mg | ORAL_CAPSULE | Freq: Two times a day (BID) | ORAL | Status: DC
  Administered 2016-05-21 – 2016-05-25 (×8): 100 mg via ORAL
  Filled 2016-05-21 (×8): qty 1

## 2016-05-21 NOTE — ED Notes (Signed)
RESPIRATORY NOTIFIED OF BREATHING Bent.

## 2016-05-21 NOTE — H&P (Signed)
History and Physical    Michelle Buck [id number] DOB: [birth year] DOA: 05/21/2016  PCP: [name], NP Patient coming from: home  Chief Complaint: SOB/wheezing  HPI: Michelle Buck is a 80 y.o. female with medical history significant of DM, HTN, CKD, and COPD (and multiple other medical problems) presenting with "wheezing carrying on" for about a month.  "I tried to work on it myself but that didn't get it."  Has been trying lemon and honey; Robitussin; cough drops.  Has also tried breathing treatments - this would help for short periods of time but it would start back.  It has gotten worse and she had to sit up all night last night because she couldn't catch her breath.  Using treatments BID and inhaler about 3 times during the day.  Last neb about 530pm.  Cough productive of yellowish sputum.  No fevers.  +nasal congestion.  No sick contacts.  Generalized aches and pains which are chronic and unchanged.  Chronic L-sided hearing loss.  Mild headache intermittently.  Has hiatal hernia and feels bloated.  Occasional constipation.    ED Course: Continuous neb x 2; PO Prednisone  Review of Systems: As per HPI otherwise 10 point review of systems reviewed and negative.   Ambulatory Status:  Walks with walker or uses Hover-round, does not ambulate well; has aide who comes in to help at home  Past Medical History  Diagnosis Date  . Diabetes mellitus   . Hypertension   . Obese   . Hiatal hernia   . Scoliosis   . Arthritis   . Acute bronchitis   . COPD (chronic obstructive pulmonary disease) ([place])     present for several years, diagnosed in the last few years  . Unspecified hereditary and idiopathic peripheral neuropathy   . Secondary diabetes mellitus with renal manifestations, not stated as uncontrolled, or unspecified   . Chronic kidney disease, stage II (mild)   . Disorder of bone and cartilage, unspecified   . Complications affecting other specified body systems, hypertension     . Hypopotassemia   . Anxiety   . Congestive heart failure, unspecified     stress test in March at [facility]  . GERD (gastroesophageal reflux disease)   . Lumbago   . Insomnia, unspecified   . Hyperlipidemia   . Unspecified glaucoma   . Diaphragmatic hernia without mention of obstruction or gangrene   . Osteoarthrosis, unspecified whether generalized or localized, unspecified site   . Degenerative arthritis of knee, bilateral   . PONV (postoperative nausea and vomiting)     Past Surgical History  Procedure Laterality Date  . Abdominal hysterectomy    . Shoulder surgery    . Total hip arthroplasty      bilateral  . Knee arthroscopy      bilateral  . Colonoscopy  08/06/2011  . Foot surgery      Social History   Social History  . Marital Status: Widowed    Spouse Name: N/A  . Number of Children: N/A  . Years of Education: N/A   Occupational History  . Retired    Social History Main Topics  . Smoking status: Former Smoker    Types: Cigarettes    Quit date: 02/27/1997  . Smokeless tobacco: Never Used     Comment: remote history - stopped 19 years ago; 20 pack year history  . Alcohol Use: No  . Drug Use: No  . Sexual Activity: No   Other Topics Concern  .  Not on file   Social History Narrative    Allergies  Allergen Reactions  . Tramadol Other (See Comments)    Leg cramps   . Codeine Nausea And Vomiting    Patient states N/V with codeine  . Hydrocodone Nausea And Vomiting  . Oxycodone Other (See Comments)    Patient states she can tolerate oxycodone  . Tylenol [Acetaminophen] Hives  . Penicillins Rash    Patient states rash/itch with penicillin Has patient had a PCN reaction causing immediate rash, facial/tongue/throat swelling, SOB or lightheadedness with hypotension: Yes- broke me out and i was itching Has patient had a PCN reaction causing severe rash involving mucus membranes or skin necrosis: No Has patient had a PCN reaction that required  hospitalization Yes- i was already in the hospital Has patient had a PCN reaction occurring within the last 10 years: No If all of the above answers are "NO    Family History  Problem Relation Age of Onset  . Diabetes Mother   . Heart disease Mother   . Heart disease Brother   . Diabetes Brother   . Kidney disease Father   . Scoliosis Sister   . Diabetes Brother   . Diabetes Brother   . Heart disease Brother   . Scoliosis Brother   . Stroke Brother   . Heart attack Brother     Prior to Admission medications   Medication Sig Start Date End Date Taking? Authorizing Provider  albuterol (PROVENTIL HFA;VENTOLIN HFA) 108 (90 Base) MCG/ACT inhaler Inhale 2 puffs into the lungs every 4 (four) hours as needed for wheezing or shortness of breath.   Yes Historical Provider, MD  AMITIZA 24 MCG capsule TAKE 1 CAPSULE BY MOUTH 2 TIMES A DAY WITH MEALS 03/18/16  Yes [name], NP  aspirin EC 81 MG tablet Take 81 mg by mouth daily.   Yes Historical Provider, MD  budesonide (PULMICORT) 0.25 MG/2ML nebulizer solution Take one ampule in Neb twice daily routinely 05/15/16  Yes Gildardo Cranker, DO  Cholecalciferol (VITAMIN D3) 2000 units TABS Take 2,000 Units by mouth daily.   Yes Historical Provider, MD  cilostazol (PLETAL) 100 MG tablet Take 100 mg by mouth daily.   Yes Historical Provider, MD  cloNIDine (CATAPRES) 0.1 MG tablet TAKE 1 TABLET BY MOUTH EVERY DAY 01/22/16  Yes [name], NP  cycloSPORINE (RESTASIS) 0.05 % ophthalmic emulsion Place 1 drop into both eyes 2 (two) times daily. One drop once a day bilaterally for dry eyes   Yes Historical Provider, MD  diclofenac sodium (VOLTAREN) 1 % GEL APPLY 4 GRAM TOPICALLY TWICE DAILY as needed for pain 01/07/16  Yes [name], NP  DULoxetine (CYMBALTA) 30 MG capsule TAKE 1 CAPSULE BY MOUTH EVERY DAY TO HELP ANXIETY AND PAINS 04/15/16  Yes [name], NP  EASY TOUCH INSULIN SYRINGE 31G X 5/16" 0.5 ML MISC USE UP TO 2 TIMES A DAY AS  DIRECTED 04/15/16  Yes [name], NP  enalapril (VASOTEC) 10 MG tablet TAKE 1 TABLET BY MOUTH ONCE DAILY FOR BLOOD PRESSURE AND STRENGTHEN HEART 04/15/16  Yes [name], NP  gabapentin (NEURONTIN) 100 MG capsule TAKE 2 CAPSULES BY MOUTH THREE TIMES A DAY Patient taking differently: TAKE 200mg   BY MOUTH THREE TIMES A DAY 03/18/16  Yes [name], NP  guaifenesin (ROBITUSSIN) 100 MG/5ML syrup Take 200 mg by mouth 3 (three) times daily as needed for cough.   Yes Historical Provider, MD  HUMULIN  70/30 (70-30) 100 UNIT/ML injection INJECT 40 UNITS SUBCUTANEOUSLY WITH BREAKFAST AND 20 UNITS SUBCUTANEOUSLY WITH DINNER TO CONTROL BLOOD SUGAR. Patient taking differently: INJECT 40 UNITS SUBCUTANEOUSLY WITH BREAKFAST AND 25 UNITS SUBCUTANEOUSLY WITH DINNER TO CONTROL BLOOD SUGAR. 04/15/16  Yes Tivis Ringer, RPH-CPP  hydrALAZINE (APRESOLINE) 50 MG tablet TAKE 1 TABLET BY MOUTH TWICE DAILY 02/12/16  Yes [name], NP  HYDROcodone-acetaminophen (NORCO) 10-325 MG tablet Take 1 tablet by mouth 3 (three) times daily. Patient taking differently: Take 1 tablet by mouth every 8 (eight) hours as needed for moderate pain or severe pain.  05/15/16  Yes Monica Carter, DO  ipratropium-albuterol (DUONEB) 0.5-2.5 (3) MG/3ML SOLN Take 66mls by nebulization every 6 hours as needed for SOB/Wheezing 01/09/16  Yes [name], NP  levofloxacin (LEVAQUIN) 500 MG tablet Take 1 tablet (500 mg total) by mouth daily. 05/15/16  Yes Gildardo Cranker, DO  metoprolol succinate (TOPROL-XL) 25 MG 24 hr tablet TAKE 1 TABLET BY MOUTH EVERY DAY WITH OR IMMEDIATELY FOLLOWING A MEAL FOR BLOOD PRESSURE 04/15/16  Yes [name], NP  NEXIUM 40 MG capsule TAKE 1 CAPSULE BY MOUTH ONCE DAILY 04/15/16  Yes [name], NP  polyethylene glycol (MIRALAX / GLYCOLAX) packet DISSOLVE 17 GRAMS (1 PACKET) INTO 8 OUNCES OF LIQUID AND DRINK EVERY DAY Patient taking differently: DISSOLVE 17 GRAMS (1 PACKET) INTO 8 OUNCES OF  LIQUID AND DRINK daily as needed for constipation 05/14/16  Yes Gildardo Cranker, DO  potassium chloride SA (K-DUR,KLOR-CON) 20 MEQ tablet TAKE 1 TABLET BY MOUTH EVERY DAY 02/12/16  Yes [name], NP  sertraline (ZOLOFT) 50 MG tablet TAKE 1 TABLET BY MOUTH EVERY DAY 05/14/16  Yes Gildardo Cranker, DO  simvastatin (ZOCOR) 20 MG tablet TAKE 1 TABLET BY MOUTH EVERY DAY 04/15/16  Yes [name], NP  sucralfate (CARAFATE) 1 g tablet TAKE 1 TABLET BY MOUTH TWICE DAILY ON EMPTY STOMACH. 05/14/16  Yes Gildardo Cranker, DO  timolol (TIMOPTIC) 0.5 % ophthalmic solution INSTILL 1 DROP INTO EACH EYE TWICE A DAY 02/12/16  Yes [name], NP  torsemide (DEMADEX) 20 MG tablet Take 1 tablet by mouth daily for edema. 01/22/16  Yes [name], NP  TRAVATAN Z 0.004 % SOLN ophthalmic solution Place 1 drop into both eyes at bedtime.  04/09/16  Yes Historical Provider, MD  magnesium oxide (MAG-OX) 400 MG tablet Take 1 tablet (400 mg total) by mouth 2 (two) times daily. Patient not taking: Reported on 05/21/2016 11/06/15   [name], NP    Physical Exam: Filed Vitals:   05/21/16 1822 05/21/16 2000 05/21/16 2045 05/21/16 2052  BP:  142/64 140/61   Pulse:  62 66   Temp:   98.6 F (37 C)   TempSrc:   Oral   Resp:  17 19   Height:   5\' 2"  (1.575 m)   Weight:   98.6 kg (217 lb 6 oz)   SpO2: 88% 97% 99% 96%     General: Audible wheezing throughout history/physical, patient in mild distress Eyes:  PERRL, EOMI, normal lids, iris ENT:  grossly normal hearing, lips & tongue, mmm Neck:  no LAD, masses or thyromegaly Cardiovascular:  RRR, no m/r/g. Trace-1+ LE edema.  Respiratory: Increased WOB, poor air movement but there is movement throughout both lung fields, audible wheezing from the doorway/hall Abdomen:  soft, ntnd, NABS Skin:  no rash or induration seen on limited exam Musculoskeletal:  grossly normal tone BUE/BLE, good ROM, no bony  abnormality Psychiatric:  grossly normal mood and affect,  speech fluent and appropriate, AOx3 Neurologic:  CN 2-12 grossly intact, moves all extremities in coordinated fashion, sensation intact  Labs on Admission: I have personally reviewed following labs and imaging studies  CBC:  Recent Labs Lab 05/21/16 1622 05/21/16 1627  WBC 8.1  --   NEUTROABS 4.5  --   HGB 10.8* 12.2  HCT 33.6* 36.0  MCV 81.0  --   PLT 255  --    Basic Metabolic Panel:  Recent Labs Lab 05/21/16 1627  NA 144  K 3.6  CL 105  GLUCOSE 126*  BUN 27*  CREATININE 2.30*   GFR: Estimated Creatinine Clearance: 22.5 mL/min (by C-G formula based on Cr of 2.3). Liver Function Tests: No results for input(s): AST, ALT, ALKPHOS, BILITOT, PROT, ALBUMIN in the last 168 hours. No results for input(s): LIPASE, AMYLASE in the last 168 hours. No results for input(s): AMMONIA in the last 168 hours. Coagulation Profile: No results for input(s): INR, PROTIME in the last 168 hours. Cardiac Enzymes: No results for input(s): CKTOTAL, CKMB, CKMBINDEX, TROPONINI in the last 168 hours. BNP (last 3 results) No results for input(s): PROBNP in the last 8760 hours. HbA1C: No results for input(s): HGBA1C in the last 72 hours. CBG: No results for input(s): GLUCAP in the last 168 hours. Lipid Profile: No results for input(s): CHOL, HDL, LDLCALC, TRIG, CHOLHDL, LDLDIRECT in the last 72 hours. Thyroid Function Tests: No results for input(s): TSH, T4TOTAL, FREET4, T3FREE, THYROIDAB in the last 72 hours. Anemia Panel: No results for input(s): VITAMINB12, FOLATE, FERRITIN, TIBC, IRON, RETICCTPCT in the last 72 hours. Urine analysis:    Component Value Date/Time   COLORURINE YELLOW 12/28/2015 [place] 12/28/2015 1305   LABSPEC 1.007 12/28/2015 1305   PHURINE 5.0 12/28/2015 1305   GLUCOSEU NEGATIVE 12/28/2015 1305   HGBUR TRACE* 12/28/2015 1305   BILIRUBINUR NEGATIVE 12/28/2015 1305   KETONESUR NEGATIVE 12/28/2015 1305   PROTEINUR NEGATIVE 12/28/2015 1305    UROBILINOGEN 0.2 02/14/2008 0947   NITRITE NEGATIVE 12/28/2015 1305   LEUKOCYTESUR NEGATIVE 12/28/2015 1305    Creatinine Clearance: Estimated Creatinine Clearance: 22.5 mL/min (by C-G formula based on Cr of 2.3).  Sepsis Labs: @LABRCNTIP (procalcitonin:4,lacticidven:4) )No results found for this or any previous visit (from the past 240 hour(s)).   Radiological Exams on Admission: Dg Chest 2 View  05/21/2016  CLINICAL DATA:  Productive cough, SOB, and chest pain x1 month. Hx of HTN, diabetes, CHF. Former smoker x19 years ago. EXAM: CHEST  2 VIEW COMPARISON:  Multiple prior studies including 01/15/2016 FINDINGS: Heart is upper normal in size. There is focal eventration of the right hemidiaphragm, stable in appearance. Hiatal hernia is present. Mild bronchitic changes are noted. There are no focal consolidations or pleural effusions. No pulmonary edema. Mid thoracic spondylosis is noted. IMPRESSION: No evidence for acute cardiopulmonary abnormality. Electronically Signed   By: Nolon Nations M.D.   On: 05/21/2016 13:08    EKG: Independently reviewed. NSR, unremarkable EKG, NSCSLT  Assessment/Plan Principal Problem:   Acute on chronic respiratory failure (HCC) Active Problems:   Acute on chronic diastolic congestive heart failure (HCC)   Diabetes mellitus type 2 in obese [facility])   Essential hypertension, benign   Peripheral autonomic neuropathy due to diabetes mellitus ([place])   Morbid obesity (HCC)   Chronic kidney disease (CKD), stage IV (severe) (HCC)   Diabetes mellitus with diabetic nephropathy, with long-term current use of insulin ([place])   Lumbago  COPD exacerbation (HCC)   PAD (peripheral artery disease) (HCC)   Glaucoma    1. Acute on chronic respiratory failure - Patient with underlying COPD, presenting with apparent and concerning wheezing.  Given continuous nebs x 2 in ER and ordered Solumedrol and doxycycline (meets COPD GOLD criteria for abx given SOB and changed in sputum  volume/purulence and recently completed Levaquin course).  Will admit to observation status on telemetry floor for now, but if patient does not show improvement in next 24-48 hours she may need to transition to admit status.  Continue Solumedrol 60 mg IV q12 hours for now.  Duonebs ordered q4h with scheduled albuterol nebs q2h and then q1h prn.  Continue home guaifenesin for cough.  Sputum culture ordered.  COPD GOLD protocol implemented.  2. Has h/o diastolic heart failure -  Current indication is that this is COPD-related rather than CHF-related.  If new concerns arise, may need further consideration of this issue.  3.  DM - On 70/30 insulin at home, will continue and provide SSI coverage.  4. HTN - continue home medications.  5. CKD - Needs renal adjustment of medications.  6. Continue other home medications as per med rec note.   DVT prophylaxis: Lovenox Code Status: full - per discussion with patient/family  Family Communication: brother and sister present for entire H&P and in agreement with plan  Disposition Plan: home when clinically improved  Consults called: none  Admission status: observation - med surg    Karmen Bongo MD [facility]  If 7PM-7AM, please contact night-coverage [email or web address] Password [email or web address]  05/22/2016, 1:52 AM

## 2016-05-21 NOTE — ED Notes (Signed)
Pt with cough x 1 month.  Was seen by primary MD x 1 week ago and given antibiotics.  Pt has 2 meds left.  Not getting any better.  Denies fever.  Cough is productive.

## 2016-05-21 NOTE — ED Provider Notes (Signed)
CSN: [id number]     Arrival date & time 05/21/16  1122 History   First MD Initiated Contact with Patient 05/21/16 1459     Chief Complaint  Patient presents with  . Cough     (Consider location/radiation/quality/duration/timing/severity/associated sxs/prior Treatment) HPI   80 year old obese female with history of hypertension, diabetes, COPD, CHF presenting for evaluation of a cough.  Pt report gradual onset of persistent productive cough with yellow sputum, posttussive emesis, wheezing and increase SOB especially when laying flat for over a month.  She is having difficulty sleeping at night due to her cough.  She has tried using her nebs machine both in the morning and before bed without relief.  She denies fever but endorse chills.  She report generalized fatigue from coughing.  She was seen by her PCP last week and was placed on antibiotic, which she took for the past 5 days without any improvement. She is on enalapril, but has been on the medication for a while.  She denies increase fluid gain, stating she weigh herself nearly daily.  Pt denies diarrhea, abd pain, back pain, dysuria, or rash.  She is not on home O2.  She's a former smoker, but has quit 19 yrs ago.  Denies alcohol abuse.    Past Medical History  Diagnosis Date  . Diabetes mellitus   . Hypertension   . Obese   . Hiatal hernia   . Scoliosis   . Arthritis   . Acute bronchitis   . COPD (chronic obstructive pulmonary disease) ([place])   . Unspecified hereditary and idiopathic peripheral neuropathy   . Secondary diabetes mellitus with renal manifestations, not stated as uncontrolled, or unspecified   . Chronic kidney disease, stage II (mild)   . Chronic kidney disease, stage II (mild)   . Chronic kidney disease, unspecified ([place])   . Disorder of bone and cartilage, unspecified   . Complications affecting other specified body systems, hypertension   . Hypopotassemia   . Anxiety   . Congestive heart failure, unspecified    . GERD (gastroesophageal reflux disease)   . Lumbago   . Insomnia, unspecified   . Hyperlipidemia   . Unspecified glaucoma   . Diaphragmatic hernia without mention of obstruction or gangrene   . Osteoarthrosis, unspecified whether generalized or localized, unspecified site   . Degenerative arthritis of knee, bilateral   . PONV (postoperative nausea and vomiting)    Past Surgical History  Procedure Laterality Date  . Abdominal hysterectomy    . Shoulder surgery    . Total hip arthroplasty      bilateral  . Knee arthroscopy      bilateral  . Colonoscopy  08/06/2011  . Foot surgery     Family History  Problem Relation Age of Onset  . Diabetes Mother   . Heart disease Mother   . Heart disease Brother   . Diabetes Brother   . Kidney disease Father   . Scoliosis Sister   . Diabetes Brother   . Diabetes Brother   . Heart disease Brother   . Scoliosis Brother   . Stroke Brother   . Heart attack Brother    Social History  Substance Use Topics  . Smoking status: Former Smoker    Types: Cigarettes    Quit date: 02/27/1997  . Smokeless tobacco: Never Used  . Alcohol Use: No   OB History    No data available     Review of Systems  All other systems reviewed and  are negative.     Allergies  Tramadol; Codeine; Hydrocodone; Oxycodone; Tylenol; and Penicillins  Home Medications   Prior to Admission medications   Medication Sig Start Date End Date Taking? Authorizing Provider  albuterol (PROVENTIL HFA;VENTOLIN HFA) 108 (90 Base) MCG/ACT inhaler Inhale 2 puffs into the lungs every 4 (four) hours as needed for wheezing or shortness of breath.    Historical Provider, MD  AMITIZA 24 MCG capsule TAKE 1 CAPSULE BY MOUTH 2 TIMES A DAY WITH MEALS 03/18/16   [name], NP  aspirin EC 81 MG tablet Take 81 mg by mouth daily.    Historical Provider, MD  budesonide (PULMICORT) 0.25 MG/2ML nebulizer solution Take one ampule in Neb twice daily routinely 05/15/16   Gildardo Cranker,  DO  Cholecalciferol (VITAMIN D3) 2000 units TABS Take 2,000 Units by mouth daily.    Historical Provider, MD  cilostazol (PLETAL) 100 MG tablet Take 100 mg by mouth daily.    Historical Provider, MD  cloNIDine (CATAPRES) 0.1 MG tablet TAKE 1 TABLET BY MOUTH EVERY DAY 01/22/16   [name], NP  cycloSPORINE (RESTASIS) 0.05 % ophthalmic emulsion Place 1 drop into both eyes 2 (two) times daily. One drop once a day bilaterally for dry eyes    Historical Provider, MD  diclofenac sodium (VOLTAREN) 1 % GEL APPLY 4 GRAM TOPICALLY TWICE DAILY as needed for pain 01/07/16   [name], NP  DULoxetine (CYMBALTA) 30 MG capsule TAKE 1 CAPSULE BY MOUTH EVERY DAY TO HELP ANXIETY AND PAINS 04/15/16   [name], NP  EASY [facility] INSULIN SYRINGE 31G X 5/16" 0.5 ML MISC USE UP TO 2 TIMES A DAY AS DIRECTED 04/15/16   [name], NP  enalapril (VASOTEC) 10 MG tablet TAKE 1 TABLET BY MOUTH ONCE DAILY FOR BLOOD PRESSURE AND STRENGTHEN HEART 04/15/16   [name], NP  gabapentin (NEURONTIN) 100 MG capsule TAKE 2 CAPSULES BY MOUTH THREE TIMES A DAY 03/18/16   [name], NP  guaifenesin (ROBITUSSIN) 100 MG/5ML syrup Take 200 mg by mouth 3 (three) times daily as needed for cough.    Historical Provider, MD  HUMULIN 70/30 (70-30) 100 UNIT/ML injection INJECT 40 UNITS SUBCUTANEOUSLY WITH BREAKFAST AND 20 UNITS SUBCUTANEOUSLY WITH DINNER TO CONTROL BLOOD SUGAR. 04/15/16   Tivis Ringer, RPH-CPP  hydrALAZINE (APRESOLINE) 50 MG tablet TAKE 1 TABLET BY MOUTH TWICE DAILY 02/12/16   [name], NP  HYDROcodone-acetaminophen (NORCO) 10-325 MG tablet Take 1 tablet by mouth 3 (three) times daily. 05/15/16   Gildardo Cranker, DO  insulin NPH-regular Human (HUMULIN 70/30) (70-30) 100 UNIT/ML injection Inject 40 units subcutaneously with breakfast and 25 units with dinner to control blood sugar. 10/29/15   [name], NP  ipratropium-albuterol (DUONEB) 0.5-2.5 (3) MG/3ML SOLN Take 25mls by  nebulization every 6 hours as needed for SOB/Wheezing 01/09/16   [name], NP  levofloxacin (LEVAQUIN) 500 MG tablet Take 1 tablet (500 mg total) by mouth daily. 05/15/16   Gildardo Cranker, DO  magnesium oxide (MAG-OX) 400 MG tablet Take 1 tablet (400 mg total) by mouth 2 (two) times daily. 11/06/15   [name], NP  metoprolol succinate (TOPROL-XL) 25 MG 24 hr tablet TAKE 1 TABLET BY MOUTH EVERY DAY WITH OR IMMEDIATELY FOLLOWING A MEAL FOR BLOOD PRESSURE 04/15/16   [name], NP  NEXIUM 40 MG capsule TAKE 1 CAPSULE BY MOUTH ONCE DAILY 04/15/16   [name], NP  polyethylene glycol [facility] /  GLYCOLAX) packet DISSOLVE 17 GRAMS (1 PACKET) INTO 8 OUNCES OF LIQUID AND DRINK EVERY DAY 05/14/16   Gildardo Cranker, DO  potassium chloride SA (K-DUR,KLOR-CON) 20 MEQ tablet TAKE 1 TABLET BY MOUTH EVERY DAY 02/12/16   [name], NP  sertraline (ZOLOFT) 50 MG tablet TAKE 1 TABLET BY MOUTH EVERY DAY 05/14/16   Gildardo Cranker, DO  simvastatin (ZOCOR) 20 MG tablet TAKE 1 TABLET BY MOUTH EVERY DAY 04/15/16   [name], NP  sucralfate (CARAFATE) 1 g tablet TAKE 1 TABLET BY MOUTH TWICE DAILY ON EMPTY STOMACH. 05/14/16   Gildardo Cranker, DO  timolol (TIMOPTIC) 0.5 % ophthalmic solution INSTILL 1 DROP INTO EACH EYE TWICE A DAY 02/12/16   [name], NP  torsemide (DEMADEX) 20 MG tablet Take 1 tablet by mouth daily for edema. 01/22/16   [name], NP  TRAVATAN Z 0.004 % SOLN ophthalmic solution 1 drop daily. 04/09/16   Historical Provider, MD   BP 104/62 mmHg  Pulse 64  Temp(Src) 98.6 F (37 C) (Oral)  Resp 18  SpO2 96% Physical Exam  Constitutional: She is oriented to person, place, and time. She appears well-developed and well-nourished. No distress.  Obese African-American female, nontoxic appearance, having persistent cough but does not appears to be in any respiratory distress.  HENT:  Head: Atraumatic.  Mouth/Throat: Oropharynx is clear and moist.  Eyes:  Conjunctivae are normal.  Neck: Neck supple. No JVD present.  Cardiovascular: Normal rate and regular rhythm.  Exam reveals no gallop and no friction rub.   No murmur heard. Pulmonary/Chest: Effort normal. She has wheezes. She has no rales.  Scattered rhonchi is without wheezes and persistent cough or breathing. No rales  Abdominal: Soft. There is no tenderness.  Musculoskeletal: She exhibits edema (trace pitting edema to BLE.).  Lymphadenopathy:    She has no cervical adenopathy.  Neurological: She is alert and oriented to person, place, and time.  Skin: No rash noted.  Psychiatric: She has a normal mood and affect.  Nursing note and vitals reviewed.   ED Course  Procedures (including critical care time) Labs Review Labs Reviewed  CBC WITH DIFFERENTIAL/PLATELET - Abnormal; Notable for the following:    Hemoglobin 10.8 (*)    HCT 33.6 (*)    RDW 16.7 (*)    All other components within normal limits  I-STAT CHEM 8, ED - Abnormal; Notable for the following:    BUN 27 (*)    Creatinine, Ser 2.30 (*)    Glucose, Bld 126 (*)    All other components within normal limits  BRAIN NATRIURETIC PEPTIDE  I-STAT TROPOININ, ED    Imaging Review Dg Chest 2 View  05/21/2016  CLINICAL DATA:  Productive cough, SOB, and chest pain x1 month. Hx of HTN, diabetes, CHF. Former smoker x19 years ago. EXAM: CHEST  2 VIEW COMPARISON:  Multiple prior studies including 01/15/2016 FINDINGS: Heart is upper normal in size. There is focal eventration of the right hemidiaphragm, stable in appearance. Hiatal hernia is present. Mild bronchitic changes are noted. There are no focal consolidations or pleural effusions. No pulmonary edema. Mid thoracic spondylosis is noted. IMPRESSION: No evidence for acute cardiopulmonary abnormality. Electronically Signed   By: Nolon Nations M.D.   On: 05/21/2016 13:08   I have personally reviewed and evaluated these images and lab results as part of my medical  decision-making.   EKG Interpretation   Date/Time:  Thursday May 21 2016 17:05:47 EDT Ventricular Rate:  61 PR  Interval:    QRS Duration: 87 QT Interval:  392 QTC Calculation: 395 R Axis:   86 Text Interpretation:  Sinus rhythm Borderline right axis deviation no  significant change since Feb 2017 Confirmed by Regenia Skeeter MD, SCOTT [phone])  on 05/21/2016 5:08:07 PM      MDM   Final diagnoses:  COPD exacerbation (HCC)  Renal insufficiency    BP 127/55 mmHg  Pulse 67  Temp(Src) 98.6 F (37 C) (Oral)  Resp 20  SpO2 88%   3:43 PM Patient here with persistent cough for more than a month, increasing wheezing, and having symptoms suggestive of COPD exacerbation versus CHF exacerbation. Doubt PE given the duration of symptoms. Doubt ACS. She is currently on ACE inhibitor but since her cough is productive I am less concerned for an Ace-induced cough. Given her age, workup initiated.  Care discussed with Dr. Regenia Skeeter.   6:40 PM After receiving continuous nebs patient report mild improvement of her symptoms however when ambulating her O2 sats drops down to 88% and patient was symptomatic. No evidence of overt CHF. Chest x-ray is unremarkable. Evidence of renal insufficiency which has gotten progressively worse as compared to prior values. This is likely due to medication. EKG without acute ischemic changes. Plan to have patient admitted for COPD exacerbation.  7:20 PM Appreciated consultation from [facility] Dr. Heinz Knuckles who agrees to admit pt to obs, med surg floor under her care for treatment of COPD exacerbation.    Domenic Moras, PA-C 05/21/16 1921  Sherwood Gambler, MD 05/22/16 [phone]

## 2016-05-21 NOTE — Progress Notes (Signed)
Pt seen in room 1332, found lying in bed, HR70, RR18, Spo2 96% on 1L [place].  Audible wheezes noted bilaterally in addition to upper airway wheezes.  No stridor present.  Pt able to vocalize well and states is not having any trouble breathing at this time.  No respiratory distress or increase wob noted.  Pt was given Duoneb/albuterol neb, instead of CAT as pt is not on a telemetry floor.  Pt tolerated well without incident.  Pt was instructed on proper use of nebulizer treatments.  Respiratory protocol assessment done per policy, with score of 10.  Duoneb nebulizers ordered qid and q2 prn for sob.  Treatments not scheduled at night per pt request.  Pt states she prefers to call at night if tx needed.  Pt was advised that RT is available all night and encouraged her to call should she need assistance.

## 2016-05-22 DIAGNOSIS — Z96643 Presence of artificial hip joint, bilateral: Secondary | ICD-10-CM | POA: Diagnosis present

## 2016-05-22 DIAGNOSIS — Z87891 Personal history of nicotine dependence: Secondary | ICD-10-CM | POA: Diagnosis not present

## 2016-05-22 DIAGNOSIS — Z6841 Body Mass Index (BMI) 40.0 and over, adult: Secondary | ICD-10-CM | POA: Diagnosis not present

## 2016-05-22 DIAGNOSIS — I10 Essential (primary) hypertension: Secondary | ICD-10-CM

## 2016-05-22 DIAGNOSIS — H409 Unspecified glaucoma: Secondary | ICD-10-CM | POA: Diagnosis present

## 2016-05-22 DIAGNOSIS — I739 Peripheral vascular disease, unspecified: Secondary | ICD-10-CM

## 2016-05-22 DIAGNOSIS — E1122 Type 2 diabetes mellitus with diabetic chronic kidney disease: Secondary | ICD-10-CM | POA: Diagnosis not present

## 2016-05-22 DIAGNOSIS — M25559 Pain in unspecified hip: Secondary | ICD-10-CM | POA: Diagnosis present

## 2016-05-22 DIAGNOSIS — Z833 Family history of diabetes mellitus: Secondary | ICD-10-CM | POA: Diagnosis not present

## 2016-05-22 DIAGNOSIS — I5033 Acute on chronic diastolic (congestive) heart failure: Secondary | ICD-10-CM | POA: Diagnosis not present

## 2016-05-22 DIAGNOSIS — Z79899 Other long term (current) drug therapy: Secondary | ICD-10-CM | POA: Diagnosis not present

## 2016-05-22 DIAGNOSIS — M170 Bilateral primary osteoarthritis of knee: Secondary | ICD-10-CM | POA: Diagnosis present

## 2016-05-22 DIAGNOSIS — K449 Diaphragmatic hernia without obstruction or gangrene: Secondary | ICD-10-CM | POA: Diagnosis present

## 2016-05-22 DIAGNOSIS — J9620 Acute and chronic respiratory failure, unspecified whether with hypoxia or hypercapnia: Secondary | ICD-10-CM | POA: Diagnosis not present

## 2016-05-22 DIAGNOSIS — I130 Hypertensive heart and chronic kidney disease with heart failure and stage 1 through stage 4 chronic kidney disease, or unspecified chronic kidney disease: Secondary | ICD-10-CM | POA: Diagnosis not present

## 2016-05-22 DIAGNOSIS — D638 Anemia in other chronic diseases classified elsewhere: Secondary | ICD-10-CM | POA: Diagnosis present

## 2016-05-22 DIAGNOSIS — Z794 Long term (current) use of insulin: Secondary | ICD-10-CM | POA: Diagnosis not present

## 2016-05-22 DIAGNOSIS — T380X5A Adverse effect of glucocorticoids and synthetic analogues, initial encounter: Secondary | ICD-10-CM | POA: Diagnosis present

## 2016-05-22 DIAGNOSIS — E1121 Type 2 diabetes mellitus with diabetic nephropathy: Secondary | ICD-10-CM | POA: Diagnosis present

## 2016-05-22 DIAGNOSIS — Z8249 Family history of ischemic heart disease and other diseases of the circulatory system: Secondary | ICD-10-CM | POA: Diagnosis not present

## 2016-05-22 DIAGNOSIS — R0602 Shortness of breath: Secondary | ICD-10-CM | POA: Diagnosis not present

## 2016-05-22 DIAGNOSIS — E1143 Type 2 diabetes mellitus with diabetic autonomic (poly)neuropathy: Secondary | ICD-10-CM | POA: Diagnosis present

## 2016-05-22 DIAGNOSIS — Z885 Allergy status to narcotic agent status: Secondary | ICD-10-CM | POA: Diagnosis not present

## 2016-05-22 DIAGNOSIS — F329 Major depressive disorder, single episode, unspecified: Secondary | ICD-10-CM | POA: Diagnosis present

## 2016-05-22 DIAGNOSIS — K219 Gastro-esophageal reflux disease without esophagitis: Secondary | ICD-10-CM | POA: Diagnosis present

## 2016-05-22 DIAGNOSIS — J441 Chronic obstructive pulmonary disease with (acute) exacerbation: Secondary | ICD-10-CM | POA: Diagnosis not present

## 2016-05-22 DIAGNOSIS — J9621 Acute and chronic respiratory failure with hypoxia: Secondary | ICD-10-CM | POA: Diagnosis present

## 2016-05-22 DIAGNOSIS — H9192 Unspecified hearing loss, left ear: Secondary | ICD-10-CM | POA: Diagnosis present

## 2016-05-22 DIAGNOSIS — D72829 Elevated white blood cell count, unspecified: Secondary | ICD-10-CM | POA: Diagnosis not present

## 2016-05-22 DIAGNOSIS — M545 Low back pain: Secondary | ICD-10-CM | POA: Diagnosis present

## 2016-05-22 DIAGNOSIS — Z880 Allergy status to penicillin: Secondary | ICD-10-CM | POA: Diagnosis not present

## 2016-05-22 DIAGNOSIS — Z888 Allergy status to other drugs, medicaments and biological substances status: Secondary | ICD-10-CM | POA: Diagnosis not present

## 2016-05-22 DIAGNOSIS — F419 Anxiety disorder, unspecified: Secondary | ICD-10-CM | POA: Diagnosis present

## 2016-05-22 DIAGNOSIS — E1151 Type 2 diabetes mellitus with diabetic peripheral angiopathy without gangrene: Secondary | ICD-10-CM | POA: Diagnosis present

## 2016-05-22 DIAGNOSIS — Z96653 Presence of artificial knee joint, bilateral: Secondary | ICD-10-CM | POA: Diagnosis present

## 2016-05-22 DIAGNOSIS — Z823 Family history of stroke: Secondary | ICD-10-CM | POA: Diagnosis not present

## 2016-05-22 DIAGNOSIS — E785 Hyperlipidemia, unspecified: Secondary | ICD-10-CM | POA: Diagnosis present

## 2016-05-22 DIAGNOSIS — M419 Scoliosis, unspecified: Secondary | ICD-10-CM | POA: Diagnosis present

## 2016-05-22 DIAGNOSIS — I2510 Atherosclerotic heart disease of native coronary artery without angina pectoris: Secondary | ICD-10-CM | POA: Diagnosis present

## 2016-05-22 DIAGNOSIS — E1142 Type 2 diabetes mellitus with diabetic polyneuropathy: Secondary | ICD-10-CM | POA: Diagnosis present

## 2016-05-22 DIAGNOSIS — Z841 Family history of disorders of kidney and ureter: Secondary | ICD-10-CM | POA: Diagnosis not present

## 2016-05-22 DIAGNOSIS — Z7951 Long term (current) use of inhaled steroids: Secondary | ICD-10-CM | POA: Diagnosis not present

## 2016-05-22 DIAGNOSIS — N184 Chronic kidney disease, stage 4 (severe): Secondary | ICD-10-CM | POA: Diagnosis not present

## 2016-05-22 DIAGNOSIS — E119 Type 2 diabetes mellitus without complications: Secondary | ICD-10-CM

## 2016-05-22 DIAGNOSIS — Z7982 Long term (current) use of aspirin: Secondary | ICD-10-CM | POA: Diagnosis not present

## 2016-05-22 LAB — GLUCOSE, CAPILLARY
GLUCOSE-CAPILLARY: 183 mg/dL — AB (ref 65–99)
GLUCOSE-CAPILLARY: 123 mg/dL — AB (ref 65–99)
GLUCOSE-CAPILLARY: 71 mg/dL (ref 65–99)
Glucose-Capillary: 247 mg/dL — ABNORMAL HIGH (ref 65–99)

## 2016-05-22 LAB — CBC
HCT: 33 % — ABNORMAL LOW (ref 36.0–46.0)
Hemoglobin: 10.5 g/dL — ABNORMAL LOW (ref 12.0–15.0)
MCH: 26 pg (ref 26.0–34.0)
MCHC: 31.8 g/dL (ref 30.0–36.0)
MCV: 81.7 fL (ref 78.0–100.0)
PLATELETS: 266 10*3/uL (ref 150–400)
RBC: 4.04 MIL/uL (ref 3.87–5.11)
RDW: 16.5 % — AB (ref 11.5–15.5)
WBC: 6.6 10*3/uL (ref 4.0–10.5)

## 2016-05-22 LAB — BASIC METABOLIC PANEL
Anion gap: 11 (ref 5–15)
BUN: 27 mg/dL — AB (ref 6–20)
CALCIUM: 8.5 mg/dL — AB (ref 8.9–10.3)
CO2: 24 mmol/L (ref 22–32)
CREATININE: 2.32 mg/dL — AB (ref 0.44–1.00)
Chloride: 104 mmol/L (ref 101–111)
GFR calc non Af Amer: 19 mL/min — ABNORMAL LOW (ref >60)
GFR, EST AFRICAN AMERICAN: 22 mL/min — AB (ref >60)
Glucose, Bld: 260 mg/dL — ABNORMAL HIGH (ref 65–99)
Potassium: 4 mmol/L (ref 3.5–5.1)
Sodium: 139 mmol/L (ref 135–145)

## 2016-05-22 MED ORDER — INSULIN ASPART 100 UNIT/ML ~~LOC~~ SOLN
0.0000 [IU] | Freq: Three times a day (TID) | SUBCUTANEOUS | Status: DC
  Administered 2016-05-22: 2 [IU] via SUBCUTANEOUS
  Administered 2016-05-22 (×2): 1 [IU] via SUBCUTANEOUS
  Administered 2016-05-23 (×2): 2 [IU] via SUBCUTANEOUS
  Administered 2016-05-24 (×2): 1 [IU] via SUBCUTANEOUS
  Administered 2016-05-25: 2 [IU] via SUBCUTANEOUS

## 2016-05-22 NOTE — Progress Notes (Signed)
Nutrition Brief Note  RD consulted for assessment  Wt Readings from Last 15 Encounters:  05/21/16 217 lb 6 oz (98.6 kg)  05/15/16 219 lb 9.6 oz (99.61 kg)  04/10/16 219 lb 6.4 oz (99.519 kg)  04/08/16 220 lb (99.791 kg)  03/12/16 220 lb (99.791 kg)  02/26/16 221 lb 12.8 oz (100.608 kg)  02/24/16 225 lb 12.8 oz (102.422 kg)  02/05/16 228 lb (103.42 kg)  01/28/16 230 lb 6.4 oz (104.509 kg)  01/08/16 229 lb (103.874 kg)  01/07/16 232 lb 12.8 oz (105.597 kg)  01/03/16 227 lb 11.2 oz (103.284 kg)  12/24/15 230 lb (104.327 kg)  12/19/15 225 lb (102.059 kg)  12/10/15 227 lb 3.2 oz (103.057 kg)   Spoke with Ms. Harada at bedside. She had a tray during my visit that she had consumed ~50% of and states "I will probably finish it." Pt endorses intentional wt loss - I have been walking more and trying to eat less. Normally does an egg, bacon, and grits for breakfast. Seems to be meeting needs at home. Endorses good appetite, "not a big eater."  Body mass index is 39.75 kg/(m^2). Patient meets criteria for obese class II based on current BMI.   Current diet order is heart healthy/carb mod, patient is consuming approximately 25-100% of meals at this time. Labs and medications reviewed.   No nutrition interventions warranted at this time. If nutrition issues arise, please consult RD.   Satira Anis. Chasiti Waddington, MS, RD LDN Inpatient Clinical Dietitian Pager [phone]

## 2016-05-22 NOTE — Evaluation (Signed)
Physical Therapy Evaluation [name] MRN: [id number] DOB: [birth year] Today's Date: 05/22/2016   History of Present Illness  80 yo female admitted with acute respiratory failure. Hx of HTN, DM, COPD, CHF, obesity, scoliosis, lumbago.   Clinical Impression  On eval, pt was Min guard assist for mobility-walked ~60 feet with RW. Pt tolerated activity fairly well. O2 sats 94% on RA at rest after ambulation. Discussed d/c plan-pt states she plans to return home. She is agreeable to HHPT follow up. Recommend nursing reassess ambulatory sats prior to discharge.     Follow Up Recommendations Home health PT;Supervision - Intermittent    Equipment Recommendations  None recommended by PT    Recommendations for Other Services       Precautions / Restrictions Precautions Precautions: Fall Restrictions Weight Bearing Restrictions: No      Mobility  Bed Mobility               General bed mobility comments: sitting EOB  Transfers Overall transfer level: Needs assistance Equipment used: Rolling walker (2 wheeled) Transfers: Sit to/from Stand Sit to Stand: Supervision         General transfer comment: for safety, hand placement.   Ambulation/Gait Ambulation/Gait assistance: Min guard Ambulation Distance (Feet): 60 Feet Assistive device: Rolling walker (2 wheeled) Gait Pattern/deviations: Step-through pattern;Decreased stride length;Trunk flexed     General Gait Details: 3 brief standing rest breaks needed. slow gait speed.   Stairs            Wheelchair Mobility    Modified Rankin (Stroke Patients Only)       Balance                                             Pertinent Vitals/Pain Pain Assessment: Faces Faces Pain Scale: Hurts little more Pain Location: back, LEs when ambulating Pain Descriptors / Indicators: Aching Pain Intervention(s): Limited activity within patient's tolerance;Repositioned    Home Living  Family/patient expects to be discharged to:: Private residence Living Arrangements: Alone Available Help at Discharge: Family;Personal care attendant (3 hours/day) Type of Home: Apartment Home Access: Ramped entrance     Home Layout: One level Home Equipment: Walker - 4 wheels;Hand held shower head;Shower seat;Grab bars - toilet      Prior Function Level of Independence: Needs assistance   Gait / Transfers Assistance Needed: uses rollator independently at home  ADL's / Homemaking Assistance Needed: aide comes to assist with bathing/getting into/out of shower        Hand Dominance        Extremity/Trunk Assessment   Upper Extremity Assessment: Overall WFL for tasks assessed           Lower Extremity Assessment: Generalized weakness      Cervical / Trunk Assessment: Normal  Communication   Communication: No difficulties  Cognition Arousal/Alertness: Awake/alert Behavior During Therapy: WFL for tasks assessed/performed Overall Cognitive Status: Within Functional Limits for tasks assessed                      General Comments      Exercises        Assessment/Plan    PT Assessment Patient needs continued PT services  PT Diagnosis Difficulty walking;Generalized weakness   PT Problem List Decreased mobility;Decreased activity tolerance  PT Treatment Interventions DME instruction;Gait training;Functional mobility training;Patient/family education;Therapeutic exercise  PT Goals (Current goals can be found in the Care Plan section) Acute Rehab PT Goals Patient Stated Goal: home soon PT Goal Formulation: With patient Time For Goal Achievement: 06/05/16 Potential to Achieve Goals: Good    Frequency Min 3X/week   Barriers to discharge        Co-evaluation               End of Session   Activity Tolerance: Patient tolerated treatment well Patient left: in bed;with bed alarm set (sitting EOB)      Functional Assessment Tool Used:  clinical judgement Functional Limitation: Mobility: Walking and moving around Mobility: Walking and Moving Around Current Status [id number]): At least 1 percent but less than 20 percent impaired, limited or restricted Mobility: Walking and Moving Around Goal Status [phone]): At least 1 percent but less than 20 percent impaired, limited or restricted    Time: [id number] PT Time Calculation (min) (ACUTE ONLY): 10 min   Charges:   PT Evaluation $PT Eval Low Complexity: 1 Procedure     PT G Codes:   PT G-Codes **NOT FOR INPATIENT CLASS** Functional Assessment Tool Used: clinical judgement Functional Limitation: Mobility: Walking and moving around Mobility: Walking and Moving Around Current Status [id number]): At least 1 percent but less than 20 percent impaired, limited or restricted Mobility: Walking and Moving Around Goal Status [phone]): At least 1 percent but less than 20 percent impaired, limited or restricted    [name], MPT Pager: [phone]

## 2016-05-22 NOTE — Progress Notes (Signed)
PROGRESS NOTE    Michelle Buck  [id number] DOB: [birth year] DOA: 05/21/2016 PCP: [name], NP   Brief Narrative:  HPI on 05/21/2016 by Dr. Karmen Bongo Michelle Buck is a 80 y.o. female with medical history significant of DM, HTN, CKD, and COPD (and multiple other medical problems) presenting with "wheezing carrying on" for about a month. "I tried to work on it myself but that didn't get it." Has been trying lemon and honey; Robitussin; cough drops. Has also tried breathing treatments - this would help for short periods of time but it would start back. It has gotten worse and she had to sit up all night last night because she couldn't catch her breath. Using treatments BID and inhaler about 3 times during the day. Last neb about 530pm. Cough productive of yellowish sputum. No fevers. +nasal congestion. No sick contacts. Generalized aches and pains which are chronic and unchanged. Chronic L-sided hearing loss. Mild headache intermittently. Has hiatal hernia and feels bloated. Occasional constipation.  Assessment & Plan   Acute hypoxic respiratory failure/COPD exacerbation -Upon admission, SPO2 is 88% -Chest x-ray showed no evidence of acute cardiopulmonary normality -Continue Solu-Medrol, doxycycline, nebs -Will attempt a wean off of oxygen, as patient does not use oxygen at home  Chronic Diastolic heart failure -Currently appears to be euvolemic -BNP 41 -Continue to monitor daily weights, intake and output -Echocardiogram 01/01/2016 showed an EF of Q000111Q, grade 1 diastolic dysfunction  Diabetes mellitus, type II -Continue 7030, insulin sliding-scale, CBG monitoring  Essential hypertension -Continue metoprolol, clonidine, hydralazine  Chronic kidney disease, stage IV -Baseline creatinine approximately 1.9, currently 2.3 -Continue to monitor BMP  Depression -Continue Zoloft, Cymbalta  PAD -Continue Pletal, aspirin  ASCVD -Continue metoprolol, aspirin,  statin  Anemia of chronic disease -Baseline hemoglobin 1011, currently 10.5 -continue monitor CBC  DVT Prophylaxis  Lovenox  Code Status: Full  Family Communication: None at bedside  Disposition Plan: Admitted for observation, likely discharge in 24-48hours  Consultants None  Procedures  None  Antibiotics   Anti-infectives    Start     Dose/Rate Route Frequency Ordered Stop   05/21/16 2200  doxycycline (VIBRA-TABS) tablet 100 mg     100 mg Oral Every 12 hours 05/21/16 2008        Subjective:   Michelle Buck seen and examined today.  Patient states she is feeling better. Feels her breathing has improved.  Denies chest pain, abdominal pain, nausea, vomiting, constipation, diarrhea.    Objective:   Filed Vitals:   05/22/16 0850 05/22/16 1018 05/22/16 1224 05/22/16 1236  BP:  146/58 145/74   Pulse:  58 66   Temp:   97.9 F (36.6 C)   TempSrc:   Oral   Resp:  20 16   Height:      Weight:      SpO2: 96%  97% 94%    Intake/Output Summary (Last 24 hours) at 05/22/16 1419 Last data filed at 05/22/16 1225  Gross per 24 hour  Intake    640 ml  Output      0 ml  Net    640 ml   Filed Weights   05/21/16 2045  Weight: 98.6 kg (217 lb 6 oz)    Exam  General: Well developed, well nourished, NAD, appears stated age  HEENT: NCAT, mucous membranes moist.   Cardiovascular: S1 S2 auscultated, no murmurs, RRR  Respiratory: Expiratory wheezing   Abdomen: Soft, obese, nontender, nondistended, + bowel sounds  Extremities: warm dry without cyanosis  clubbing. Trace LE edema  Neuro: AAOx3, nonfocal  Psych: Normal affect and demeanor    Data Reviewed: I have personally reviewed following labs and imaging studies  CBC:  Recent Labs Lab 05/21/16 1622 05/21/16 1627 05/22/16 0344  WBC 8.1  --  6.6  NEUTROABS 4.5  --   --   HGB 10.8* 12.2 10.5*  HCT 33.6* 36.0 33.0*  MCV 81.0  --  81.7  PLT 255  --  123456   Basic Metabolic Panel:  Recent Labs Lab  05/21/16 1627 05/22/16 0344  NA 144 139  K 3.6 4.0  CL 105 104  CO2  --  24  GLUCOSE 126* 260*  BUN 27* 27*  CREATININE 2.30* 2.32*  CALCIUM  --  8.5*   GFR: Estimated Creatinine Clearance: 22.3 mL/min (by C-G formula based on Cr of 2.32). Liver Function Tests: No results for input(s): AST, ALT, ALKPHOS, BILITOT, PROT, ALBUMIN in the last 168 hours. No results for input(s): LIPASE, AMYLASE in the last 168 hours. No results for input(s): AMMONIA in the last 168 hours. Coagulation Profile: No results for input(s): INR, PROTIME in the last 168 hours. Cardiac Enzymes: No results for input(s): CKTOTAL, CKMB, CKMBINDEX, TROPONINI in the last 168 hours. BNP (last 3 results) No results for input(s): PROBNP in the last 8760 hours. HbA1C: No results for input(s): HGBA1C in the last 72 hours. CBG:  Recent Labs Lab 05/22/16 0805 05/22/16 1141  GLUCAP 247* 183*   Lipid Profile: No results for input(s): CHOL, HDL, LDLCALC, TRIG, CHOLHDL, LDLDIRECT in the last 72 hours. Thyroid Function Tests: No results for input(s): TSH, T4TOTAL, FREET4, T3FREE, THYROIDAB in the last 72 hours. Anemia Panel: No results for input(s): VITAMINB12, FOLATE, FERRITIN, TIBC, IRON, RETICCTPCT in the last 72 hours. Urine analysis:    Component Value Date/Time   COLORURINE YELLOW 12/28/2015 [place] 12/28/2015 1305   LABSPEC 1.007 12/28/2015 1305   PHURINE 5.0 12/28/2015 1305   GLUCOSEU NEGATIVE 12/28/2015 1305   HGBUR TRACE* 12/28/2015 1305   BILIRUBINUR NEGATIVE 12/28/2015 1305   KETONESUR NEGATIVE 12/28/2015 1305   PROTEINUR NEGATIVE 12/28/2015 1305   UROBILINOGEN 0.2 02/14/2008 0947   NITRITE NEGATIVE 12/28/2015 1305   LEUKOCYTESUR NEGATIVE 12/28/2015 1305   Sepsis Labs: @LABRCNTIP (procalcitonin:4,lacticidven:4)  )No results found for this or any previous visit (from the past 240 hour(s)).    Radiology Studies: Dg Chest 2 View  05/21/2016  CLINICAL DATA:  Productive cough,  SOB, and chest pain x1 month. Hx of HTN, diabetes, CHF. Former smoker x19 years ago. EXAM: CHEST  2 VIEW COMPARISON:  Multiple prior studies including 01/15/2016 FINDINGS: Heart is upper normal in size. There is focal eventration of the right hemidiaphragm, stable in appearance. Hiatal hernia is present. Mild bronchitic changes are noted. There are no focal consolidations or pleural effusions. No pulmonary edema. Mid thoracic spondylosis is noted. IMPRESSION: No evidence for acute cardiopulmonary abnormality. Electronically Signed   By: Nolon Nations M.D.   On: 05/21/2016 13:08     Scheduled Meds: . albuterol  10 mg/hr Nebulization Once  . aspirin EC  81 mg Oral Daily  . cilostazol  100 mg Oral Daily  . cloNIDine  0.1 mg Oral Daily  . cycloSPORINE  1 drop Both Eyes BID  . docusate sodium  100 mg Oral BID  . doxycycline  100 mg Oral Q12H  . DULoxetine  30 mg Oral Daily  . enoxaparin (LOVENOX) injection  30 mg Subcutaneous Q24H  . gabapentin  200 mg Oral TID  . hydrALAZINE  50 mg Oral BID  . insulin aspart  0-9 Units Subcutaneous TID WC  . insulin aspart protamine- aspart  25 Units Subcutaneous Q supper  . insulin aspart protamine- aspart  40 Units Subcutaneous Q breakfast  . ipratropium-albuterol  3 mL Nebulization QID  . latanoprost  1 drop Both Eyes QHS  . lubiprostone  24 mcg Oral BID WC  . methylPREDNISolone (SOLU-MEDROL) injection  60 mg Intravenous Q12H  . metoprolol succinate  25 mg Oral Daily  . pantoprazole  40 mg Oral Daily  . potassium chloride SA  20 mEq Oral Daily  . senna  1 tablet Oral BID  . sertraline  50 mg Oral Daily  . simvastatin  20 mg Oral Daily  . sodium chloride flush  3 mL Intravenous Q12H  . sucralfate  1 g Oral BID  . timolol  1 drop Both Eyes BID   Continuous Infusions:      Time Spent in minutes   30 minutes  Kellin Bartling D.O. on 05/22/2016 at 2:19 PM  Between 7am to 7pm - Pager - [phone]  After 7pm go to [email or web address] - password  [email or web address]  And look for the night coverage person covering for me after hours  [facility] Office  [phone]

## 2016-05-22 NOTE — Progress Notes (Signed)
Inpatient Diabetes Program Recommendations  AACE/ADA: New Consensus Statement on Inpatient Glycemic Control (2015)  Target Ranges:  Prepandial:   less than 140 mg/dL      Peak postprandial:   less than 180 mg/dL (1-2 hours)      Critically ill patients:  140 - 180 mg/dL   Lab Results  Component Value Date   GLUCAP 183* 05/22/2016   HGBA1C 7.5* 02/26/2016    Review of Glycemic Control  Results for Michelle Buck, Michelle Buck (MRN [id number]) as of 05/22/2016 12:17  Ref. Range 05/22/2016 08:05 05/22/2016 11:41  Glucose-Capillary Latest Ref Range: 65-99 mg/dL 247 (H) 183 (H)    Diabetes history: Type 2 Outpatient Diabetes medications: Novolog mix 70/30 40 units qam, 25 units qpm   * steroids 60mg  q12 hours Current orders for Inpatient glycemic control: Novolog mix 70/30 40 units qam, 25 units qpm, Novolog 0-9 units tid with meals  Inpatient Diabetes Program Recommendations:  Consider increasing Novolog correction to moderate correction scale while patient is on steroids- (taper Novolog correction if steroids are decreased)  Gentry Fitz, RN, BA, MHA, CDE Diabetes Coordinator Inpatient Diabetes Program  [phone] (Team Pager) [phone] ([place]) 05/22/2016 12:25 PM

## 2016-05-23 DIAGNOSIS — E1121 Type 2 diabetes mellitus with diabetic nephropathy: Secondary | ICD-10-CM

## 2016-05-23 LAB — CBC
HCT: 31 % — ABNORMAL LOW (ref 36.0–46.0)
Hemoglobin: 10 g/dL — ABNORMAL LOW (ref 12.0–15.0)
MCH: 25.4 pg — ABNORMAL LOW (ref 26.0–34.0)
MCHC: 32.3 g/dL (ref 30.0–36.0)
MCV: 78.7 fL (ref 78.0–100.0)
PLATELETS: 254 10*3/uL (ref 150–400)
RBC: 3.94 MIL/uL (ref 3.87–5.11)
RDW: 16.1 % — AB (ref 11.5–15.5)
WBC: 11.4 10*3/uL — AB (ref 4.0–10.5)

## 2016-05-23 LAB — BASIC METABOLIC PANEL
ANION GAP: 7 (ref 5–15)
BUN: 30 mg/dL — ABNORMAL HIGH (ref 6–20)
CALCIUM: 8.9 mg/dL (ref 8.9–10.3)
CO2: 26 mmol/L (ref 22–32)
Chloride: 105 mmol/L (ref 101–111)
Creatinine, Ser: 2.1 mg/dL — ABNORMAL HIGH (ref 0.44–1.00)
GFR, EST AFRICAN AMERICAN: 25 mL/min — AB (ref >60)
GFR, EST NON AFRICAN AMERICAN: 22 mL/min — AB (ref >60)
GLUCOSE: 176 mg/dL — AB (ref 65–99)
Potassium: 3.9 mmol/L (ref 3.5–5.1)
SODIUM: 138 mmol/L (ref 135–145)

## 2016-05-23 LAB — GLUCOSE, CAPILLARY
GLUCOSE-CAPILLARY: 184 mg/dL — AB (ref 65–99)
GLUCOSE-CAPILLARY: 107 mg/dL — AB (ref 65–99)
Glucose-Capillary: 191 mg/dL — ABNORMAL HIGH (ref 65–99)
Glucose-Capillary: 127 mg/dL — ABNORMAL HIGH (ref 65–99)

## 2016-05-23 MED ORDER — ACETAMINOPHEN 325 MG PO TABS: 650.0000 mg | ORAL_TABLET | Freq: Once | ORAL | Status: DC

## 2016-05-23 MED ORDER — DM-GUAIFENESIN ER 30-600 MG PO TB12
2.0000 | ORAL_TABLET | Freq: Two times a day (BID) | ORAL | Status: DC
  Administered 2016-05-23 – 2016-05-25 (×5): 2 via ORAL
  Filled 2016-05-23 (×5): qty 2

## 2016-05-23 MED ORDER — GUAIFENESIN 100 MG/5ML PO SOLN
200.0000 mg | Freq: Three times a day (TID) | ORAL | Status: DC | PRN
Start: 2016-05-23 — End: 2016-05-25
  Administered 2016-05-23 – 2016-05-25 (×6): 200 mg via ORAL
  Filled 2016-05-23 (×7): qty 10

## 2016-05-23 NOTE — Progress Notes (Addendum)
PROGRESS NOTE    Lexia Balmes  [id number] DOB: [birth year] DOA: 05/21/2016 PCP: [name], NP   Brief Narrative:  HPI on 05/21/2016 by Dr. Karmen Bongo Chadsity Sowder is a 80 y.o. female with medical history significant of DM, HTN, CKD, and COPD (and multiple other medical problems) presenting with "wheezing carrying on" for about a month. "I tried to work on it myself but that didn't get it." Has been trying lemon and honey; Robitussin; cough drops. Has also tried breathing treatments - this would help for short periods of time but it would start back. It has gotten worse and she had to sit up all night last night because she couldn't catch her breath. Using treatments BID and inhaler about 3 times during the day. Last neb about 530pm. Cough productive of yellowish sputum. No fevers. +nasal congestion. No sick contacts. Generalized aches and pains which are chronic and unchanged. Chronic L-sided hearing loss. Mild headache intermittently. Has hiatal hernia and feels bloated. Occasional constipation.  Assessment & Plan   Acute hypoxic respiratory failure/COPD exacerbation -Upon admission, SPO2 is 88% -Chest x-ray showed no evidence of acute cardiopulmonary normality -Continue Solu-Medrol, doxycycline, nebs -Will monitor O2 sats on and off of oxygen at rest and with ambulation -PT consulted, recommended home health  Leukocytosis  -Secondary to steroids -Continue to monitor CBC  Chronic Diastolic heart failure -Currently appears to be euvolemic -BNP 41 -Continue to monitor daily weights, intake and output -Echocardiogram 01/01/2016 showed an EF of Q000111Q, grade 1 diastolic dysfunction  Diabetes mellitus, type II -Continue 70/30, insulin sliding-scale, CBG monitoring  Essential hypertension -Continue metoprolol, clonidine, hydralazine  Chronic kidney disease, stage IV -Baseline creatinine approximately 1.9, currently 2.1 -Continue to monitor  BMP  Depression -Continue Zoloft, Cymbalta  PAD -Continue Pletal, aspirin  ASCVD -Continue metoprolol, aspirin, statin  Anemia of chronic disease -Baseline hemoglobin 10-11, currently 10 -continue monitor CBC  DVT Prophylaxis  Lovenox  Code Status: Full  Family Communication: None at bedside  Disposition Plan: Admitted, likely discharge within 24-48 hours  Consultants None  Procedures  None  Antibiotics   Anti-infectives    Start     Dose/Rate Route Frequency Ordered Stop   05/21/16 2200  doxycycline (VIBRA-TABS) tablet 100 mg     100 mg Oral Every 12 hours 05/21/16 2008        Subjective:   Odette Wolfarth seen and examined today.  Patient states she is feeling better, but continues to have cough and some wheezing.  States she had a coughing episode overnight. Denies chest pain, abdominal pain, nausea, vomiting, constipation, diarrhea.    Objective:   Filed Vitals:   05/22/16 2240 05/23/16 0643 05/23/16 0854 05/23/16 0858  BP:  127/62    Pulse:  66    Temp:  97.9 F (36.6 C)    TempSrc:  Oral    Resp:  18    Height:      Weight:      SpO2: 93% 98% 92% 92%    Intake/Output Summary (Last 24 hours) at 05/23/16 1024 Last data filed at 05/22/16 1225  Gross per 24 hour  Intake    400 ml  Output      0 ml  Net    400 ml   Filed Weights   05/21/16 2045  Weight: 98.6 kg (217 lb 6 oz)    Exam  General: Well developed, well nourished, NAD  HEENT: NCAT, mucous membranes moist.   Cardiovascular: S1 S2 auscultated, no murmurs, RRR  Respiratory: Expiratory wheezing, better air movement today  Abdomen: Soft, obese, nontender, nondistended, + bowel sounds  Extremities: warm dry without cyanosis clubbing. Trace LE edema  Neuro: AAOx3, nonfocal  Psych: Normal affect and demeanor, pleasant   Data Reviewed: I have personally reviewed following labs and imaging studies  CBC:  Recent Labs Lab 05/21/16 1622 05/21/16 1627 05/22/16 0344  05/23/16 0346  WBC 8.1  --  6.6 11.4*  NEUTROABS 4.5  --   --   --   HGB 10.8* 12.2 10.5* 10.0*  HCT 33.6* 36.0 33.0* 31.0*  MCV 81.0  --  81.7 78.7  PLT 255  --  266 0000000   Basic Metabolic Panel:  Recent Labs Lab 05/21/16 1627 05/22/16 0344 05/23/16 0346  NA 144 139 138  K 3.6 4.0 3.9  CL 105 104 105  CO2  --  24 26  GLUCOSE 126* 260* 176*  BUN 27* 27* 30*  CREATININE 2.30* 2.32* 2.10*  CALCIUM  --  8.5* 8.9   GFR: Estimated Creatinine Clearance: 24.6 mL/min (by C-G formula based on Cr of 2.1). Liver Function Tests: No results for input(s): AST, ALT, ALKPHOS, BILITOT, PROT, ALBUMIN in the last 168 hours. No results for input(s): LIPASE, AMYLASE in the last 168 hours. No results for input(s): AMMONIA in the last 168 hours. Coagulation Profile: No results for input(s): INR, PROTIME in the last 168 hours. Cardiac Enzymes: No results for input(s): CKTOTAL, CKMB, CKMBINDEX, TROPONINI in the last 168 hours. BNP (last 3 results) No results for input(s): PROBNP in the last 8760 hours. HbA1C: No results for input(s): HGBA1C in the last 72 hours. CBG:  Recent Labs Lab 05/22/16 0805 05/22/16 1141 05/22/16 1647 05/22/16 2110 05/23/16 0758  GLUCAP 247* 183* 123* 71 191*   Lipid Profile: No results for input(s): CHOL, HDL, LDLCALC, TRIG, CHOLHDL, LDLDIRECT in the last 72 hours. Thyroid Function Tests: No results for input(s): TSH, T4TOTAL, FREET4, T3FREE, THYROIDAB in the last 72 hours. Anemia Panel: No results for input(s): VITAMINB12, FOLATE, FERRITIN, TIBC, IRON, RETICCTPCT in the last 72 hours. Urine analysis:    Component Value Date/Time   COLORURINE YELLOW 12/28/2015 [place] 12/28/2015 1305   LABSPEC 1.007 12/28/2015 1305   PHURINE 5.0 12/28/2015 1305   GLUCOSEU NEGATIVE 12/28/2015 1305   HGBUR TRACE* 12/28/2015 1305   BILIRUBINUR NEGATIVE 12/28/2015 1305   KETONESUR NEGATIVE 12/28/2015 1305   PROTEINUR NEGATIVE 12/28/2015 1305    UROBILINOGEN 0.2 02/14/2008 0947   NITRITE NEGATIVE 12/28/2015 1305   LEUKOCYTESUR NEGATIVE 12/28/2015 1305   Sepsis Labs: @LABRCNTIP (procalcitonin:4,lacticidven:4)  )No results found for this or any previous visit (from the past 240 hour(s)).    Radiology Studies: Dg Chest 2 View  05/21/2016  CLINICAL DATA:  Productive cough, SOB, and chest pain x1 month. Hx of HTN, diabetes, CHF. Former smoker x19 years ago. EXAM: CHEST  2 VIEW COMPARISON:  Multiple prior studies including 01/15/2016 FINDINGS: Heart is upper normal in size. There is focal eventration of the right hemidiaphragm, stable in appearance. Hiatal hernia is present. Mild bronchitic changes are noted. There are no focal consolidations or pleural effusions. No pulmonary edema. Mid thoracic spondylosis is noted. IMPRESSION: No evidence for acute cardiopulmonary abnormality. Electronically Signed   By: Nolon Nations M.D.   On: 05/21/2016 13:08     Scheduled Meds: . acetaminophen  650 mg Oral Once  . albuterol  10 mg/hr Nebulization Once  . aspirin EC  81 mg Oral Daily  . cilostazol  100  mg Oral Daily  . cloNIDine  0.1 mg Oral Daily  . cycloSPORINE  1 drop Both Eyes BID  . dextromethorphan-guaiFENesin  2 tablet Oral BID  . docusate sodium  100 mg Oral BID  . doxycycline  100 mg Oral Q12H  . DULoxetine  30 mg Oral Daily  . enoxaparin (LOVENOX) injection  30 mg Subcutaneous Q24H  . gabapentin  200 mg Oral TID  . hydrALAZINE  50 mg Oral BID  . insulin aspart  0-9 Units Subcutaneous TID WC  . insulin aspart protamine- aspart  25 Units Subcutaneous Q supper  . insulin aspart protamine- aspart  40 Units Subcutaneous Q breakfast  . ipratropium-albuterol  3 mL Nebulization QID  . latanoprost  1 drop Both Eyes QHS  . lubiprostone  24 mcg Oral BID WC  . methylPREDNISolone (SOLU-MEDROL) injection  60 mg Intravenous Q12H  . metoprolol succinate  25 mg Oral Daily  . pantoprazole  40 mg Oral Daily  . potassium chloride SA  20 mEq  Oral Daily  . senna  1 tablet Oral BID  . sertraline  50 mg Oral Daily  . simvastatin  20 mg Oral Daily  . sodium chloride flush  3 mL Intravenous Q12H  . sucralfate  1 g Oral BID  . timolol  1 drop Both Eyes BID   Continuous Infusions:    LOS: 1 day   Time Spent in minutes   30 minutes  Dayne Chait D.O. on 05/23/2016 at 10:24 AM  Between 7am to 7pm - Pager - [phone]  After 7pm go to [email or web address] - password [email or web address]  And look for the night coverage person covering for me after hours  [facility] Office  [phone]

## 2016-05-24 DIAGNOSIS — I130 Hypertensive heart and chronic kidney disease with heart failure and stage 1 through stage 4 chronic kidney disease, or unspecified chronic kidney disease: Secondary | ICD-10-CM

## 2016-05-24 DIAGNOSIS — E1151 Type 2 diabetes mellitus with diabetic peripheral angiopathy without gangrene: Secondary | ICD-10-CM

## 2016-05-24 DIAGNOSIS — J9620 Acute and chronic respiratory failure, unspecified whether with hypoxia or hypercapnia: Secondary | ICD-10-CM

## 2016-05-24 DIAGNOSIS — J441 Chronic obstructive pulmonary disease with (acute) exacerbation: Principal | ICD-10-CM

## 2016-05-24 DIAGNOSIS — Z79899 Other long term (current) drug therapy: Secondary | ICD-10-CM

## 2016-05-24 DIAGNOSIS — Z794 Long term (current) use of insulin: Secondary | ICD-10-CM

## 2016-05-24 DIAGNOSIS — E1142 Type 2 diabetes mellitus with diabetic polyneuropathy: Secondary | ICD-10-CM

## 2016-05-24 DIAGNOSIS — D72829 Elevated white blood cell count, unspecified: Secondary | ICD-10-CM

## 2016-05-24 DIAGNOSIS — N184 Chronic kidney disease, stage 4 (severe): Secondary | ICD-10-CM

## 2016-05-24 DIAGNOSIS — D631 Anemia in chronic kidney disease: Secondary | ICD-10-CM

## 2016-05-24 DIAGNOSIS — M25559 Pain in unspecified hip: Secondary | ICD-10-CM

## 2016-05-24 DIAGNOSIS — Z7982 Long term (current) use of aspirin: Secondary | ICD-10-CM

## 2016-05-24 DIAGNOSIS — E1122 Type 2 diabetes mellitus with diabetic chronic kidney disease: Secondary | ICD-10-CM

## 2016-05-24 DIAGNOSIS — M545 Low back pain: Secondary | ICD-10-CM

## 2016-05-24 DIAGNOSIS — I5032 Chronic diastolic (congestive) heart failure: Secondary | ICD-10-CM

## 2016-05-24 DIAGNOSIS — I2510 Atherosclerotic heart disease of native coronary artery without angina pectoris: Secondary | ICD-10-CM

## 2016-05-24 DIAGNOSIS — F329 Major depressive disorder, single episode, unspecified: Secondary | ICD-10-CM

## 2016-05-24 LAB — BASIC METABOLIC PANEL
Anion gap: 7 (ref 5–15)
BUN: 35 mg/dL — AB (ref 6–20)
CALCIUM: 8.7 mg/dL — AB (ref 8.9–10.3)
CHLORIDE: 107 mmol/L (ref 101–111)
CO2: 25 mmol/L (ref 22–32)
CREATININE: 2.28 mg/dL — AB (ref 0.44–1.00)
GFR calc non Af Amer: 20 mL/min — ABNORMAL LOW (ref >60)
GFR, EST AFRICAN AMERICAN: 23 mL/min — AB (ref >60)
Glucose, Bld: 140 mg/dL — ABNORMAL HIGH (ref 65–99)
Potassium: 3.6 mmol/L (ref 3.5–5.1)
SODIUM: 139 mmol/L (ref 135–145)

## 2016-05-24 LAB — GLUCOSE, CAPILLARY
GLUCOSE-CAPILLARY: 74 mg/dL (ref 65–99)
Glucose-Capillary: 131 mg/dL — ABNORMAL HIGH (ref 65–99)
Glucose-Capillary: 138 mg/dL — ABNORMAL HIGH (ref 65–99)
Glucose-Capillary: 93 mg/dL (ref 65–99)

## 2016-05-24 LAB — CBC
HCT: 31.3 % — ABNORMAL LOW (ref 36.0–46.0)
HEMOGLOBIN: 10 g/dL — AB (ref 12.0–15.0)
MCH: 26 pg (ref 26.0–34.0)
MCHC: 31.9 g/dL (ref 30.0–36.0)
MCV: 81.5 fL (ref 78.0–100.0)
Platelets: 254 10*3/uL (ref 150–400)
RBC: 3.84 MIL/uL — ABNORMAL LOW (ref 3.87–5.11)
RDW: 16.6 % — AB (ref 11.5–15.5)
WBC: 10.1 10*3/uL (ref 4.0–10.5)

## 2016-05-24 MED ORDER — IPRATROPIUM-ALBUTEROL 0.5-2.5 (3) MG/3ML IN SOLN
3.0000 mL | Freq: Three times a day (TID) | RESPIRATORY_TRACT | Status: DC
  Administered 2016-05-24 – 2016-05-25 (×3): 3 mL via RESPIRATORY_TRACT
  Filled 2016-05-24 (×3): qty 3

## 2016-05-24 MED ORDER — DICLOFENAC SODIUM 1 % TD GEL
2.0000 g | Freq: Four times a day (QID) | TRANSDERMAL | Status: DC
  Administered 2016-05-24 – 2016-05-25 (×3): 2 g via TOPICAL
  Filled 2016-05-24: qty 100

## 2016-05-24 MED ORDER — BUDESONIDE 0.25 MG/2ML IN SUSP
0.2500 mg | Freq: Two times a day (BID) | RESPIRATORY_TRACT | Status: DC
  Administered 2016-05-24 – 2016-05-25 (×2): 0.25 mg via RESPIRATORY_TRACT
  Filled 2016-05-24: qty 2

## 2016-05-24 MED ORDER — PREDNISONE 20 MG PO TABS
40.0000 mg | ORAL_TABLET | Freq: Every day | ORAL | Status: DC
  Administered 2016-05-25: 40 mg via ORAL
  Filled 2016-05-24: qty 2

## 2016-05-24 NOTE — Progress Notes (Signed)
SATURATION QUALIFICATIONS: (This note is used to comply with regulatory documentation for home oxygen)  Patient Saturations on Room Air at Rest = 95%  Patient Saturations on Room Air while Ambulating = 92-95%  Patient Saturations on n/a Liters of oxygen while Ambulating = n/a  Please briefly explain why patient needs home oxygen: patient not ambulated with oxygen

## 2016-05-24 NOTE — Progress Notes (Signed)
PROGRESS NOTE    Michelle Buck  [id number] DOB: [birth year] DOA: 05/21/2016 PCP: [name], NP    Brief Narrative:  HPI on 05/21/2016 by Dr. Karmen Bongo Michelle Buck is a 80 y.o. female with medical history significant of DM, HTN, CKD, and COPD (and multiple other medical problems) presenting with "wheezing carrying on" for about a month. "I tried to work on it myself but that didn't get it." Has been trying lemon and honey; Robitussin; cough drops. Has also tried breathing treatments - this would help for short periods of time but it would start back. It has gotten worse and she had to sit up all night last night because she couldn't catch her breath. Using treatments BID and inhaler about 3 times during the day. Last neb about 530pm. Cough productive of yellowish sputum. No fevers. +nasal congestion. No sick contacts. Generalized aches and pains which are chronic and unchanged. Chronic L-sided hearing loss. Mild headache intermittently. Has hiatal hernia and feels bloated. Occasional constipation.  Interim History Patient slowly improving.  Today she reports feeling better but with continued cough. She is also still on oxygen.  She was not started on her home steroid inhaler.  She is not sure that she feels better now on lower dose steroids.   Assessment & Plan:   Acute on chronic respiratory failure 2/2 COPD exacerbation - Oxygen to be weaned off today - On prednisone, day 4.  Will finish 5 day course tomorrow - On Doxycycline Day 4.  She will likely need to finish 5 day course at home - Restart home pulmicort - Continue duonebs - Likely discharge tomorrow - She has pleuritic chest pain with coughing, voltaren gel trial - Continue cough syrup  Leukocytosis - No acute signs of infection, CXR without acute infiltrate, afebrile - Likely related to steroids  chronic diastolic congestive heart failure  - Euvolemic - Daily weights - Strict I/O    Diabetes  mellitus type 2 in obese with peripheral neuropathy - cointinue 70/30 40 in the AM and 25 in the PM, SSI    Essential hypertension, benign - BP today is 155/78 today, mildly high.  Monitor closely, consider increasing hydralazine - Continue home regimen of metoprolol, clonidine, hydralazine    Chronic kidney disease (CKD), stage IV (severe) (HCC) - Making urine - Baseline 1.8 to 2, she is around that number today - Continue to monitor  Lumbago and hip pain - Worsened with waling today - Pain control with hydrocone    PAD (peripheral artery disease) (HCC) - Continue pletal and aspirin  Depression - Continue zoloft and cymbalta - ? Concern re serotonin syndrome.  Monitor  AOCD - At baseline, monitor  ASCVD - Continue metoprolol, aspirin, statin   DVT prophylaxis: Lovenox Code Status: Full Family Communication: Non at bedside Disposition Plan: Discharge tomorrow   Consultants:   DM Coordinator  Procedures:  None  Antimicrobials:   Doxycycline    Subjective: Patient is improving, close to baseline, still coughing.  Continues with 1LNC in place.  She does not use this at home.   Objective: Filed Vitals:   05/23/16 2003 05/23/16 2158 05/24/16 0455 05/24/16 0918  BP: 152/76  155/78   Pulse: 64 80 67   Temp: 98.6 F (37 C)  97.9 F (36.6 C)   TempSrc: Oral  Oral   Resp: 20 18 14    Height:      Weight:      SpO2: 97%  97% 100%   No intake  or output data in the 24 hours ending 05/24/16 1015 Filed Weights   05/21/16 2045  Weight: 217 lb 6 oz (98.6 kg)    Examination:  General exam: Appears calm and comfortable, Lug[place] in place Respiratory system: Mostly clear to auscultation, with decreased breath sounds throughout, occasional wheezing, cough with deep breathing.   Cardiovascular system: S1 & S2 heard, RR, NR.  Gastrointestinal system: Abdomen is nondistended, soft and nontender.  Normal bowel sounds heard. Central nervous system: Alert and oriented. No  focal neurological deficits. Skin: No lesions or ulcers noted Psychiatry: Judgement and insight appear normal. Mood & affect appropriate.     Data Reviewed:  CBC:  Recent Labs Lab 05/21/16 1622 05/21/16 1627 05/22/16 0344 05/23/16 0346 05/24/16 0422  WBC 8.1  --  6.6 11.4* 10.1  NEUTROABS 4.5  --   --   --   --   HGB 10.8* 12.2 10.5* 10.0* 10.0*  HCT 33.6* 36.0 33.0* 31.0* 31.3*  MCV 81.0  --  81.7 78.7 81.5  PLT 255  --  266 254 0000000   Basic Metabolic Panel:  Recent Labs Lab 05/21/16 1627 05/22/16 0344 05/23/16 0346 05/24/16 0422  NA 144 139 138 139  K 3.6 4.0 3.9 3.6  CL 105 104 105 107  CO2  --  24 26 25   GLUCOSE 126* 260* 176* 140*  BUN 27* 27* 30* 35*  CREATININE 2.30* 2.32* 2.10* 2.28*  CALCIUM  --  8.5* 8.9 8.7*   CBG:  Recent Labs Lab 05/23/16 0758 05/23/16 1131 05/23/16 1559 05/23/16 2137 05/24/16 0740  GLUCAP 191* 184* 107* 127* 131*      Radiology Studies: No results found.    Scheduled Meds: . acetaminophen  650 mg Oral Once  . albuterol  10 mg/hr Nebulization Once  . aspirin EC  81 mg Oral Daily  . cilostazol  100 mg Oral Daily  . cloNIDine  0.1 mg Oral Daily  . cycloSPORINE  1 drop Both Eyes BID  . dextromethorphan-guaiFENesin  2 tablet Oral BID  . docusate sodium  100 mg Oral BID  . doxycycline  100 mg Oral Q12H  . DULoxetine  30 mg Oral Daily  . enoxaparin (LOVENOX) injection  30 mg Subcutaneous Q24H  . gabapentin  200 mg Oral TID  . hydrALAZINE  50 mg Oral BID  . insulin aspart  0-9 Units Subcutaneous TID WC  . insulin aspart protamine- aspart  25 Units Subcutaneous Q supper  . insulin aspart protamine- aspart  40 Units Subcutaneous Q breakfast  . ipratropium-albuterol  3 mL Nebulization TID  . latanoprost  1 drop Both Eyes QHS  . lubiprostone  24 mcg Oral BID WC  . metoprolol succinate  25 mg Oral Daily  . pantoprazole  40 mg Oral Daily  . potassium chloride SA  20 mEq Oral Daily  . [START ON 05/25/2016] predniSONE  40  mg Oral Q breakfast  . senna  1 tablet Oral BID  . sertraline  50 mg Oral Daily  . simvastatin  20 mg Oral Daily  . sodium chloride flush  3 mL Intravenous Q12H  . sucralfate  1 g Oral BID  . timolol  1 drop Both Eyes BID   Continuous Infusions:    LOS: 2 days    Time spent: 25 minutes    Gilles Chiquito, MD [facility] Pager [phone]  If 7PM-7AM, please contact night-coverage [email or web address] Password [facility] 05/24/2016, 10:15 AM

## 2016-05-25 DIAGNOSIS — E669 Obesity, unspecified: Secondary | ICD-10-CM

## 2016-05-25 LAB — GLUCOSE, CAPILLARY
Glucose-Capillary: 70 mg/dL (ref 65–99)
Glucose-Capillary: 151 mg/dL — ABNORMAL HIGH (ref 65–99)

## 2016-05-25 MED ORDER — DOXYCYCLINE HYCLATE 100 MG PO TABS: 100.0000 mg | ORAL_TABLET | Freq: Two times a day (BID) | ORAL | Status: DC

## 2016-05-25 MED ORDER — DM-GUAIFENESIN ER 30-600 MG PO TB12: 2.0000 | ORAL_TABLET | Freq: Two times a day (BID) | ORAL | Status: DC

## 2016-05-25 MED ORDER — PREDNISONE 20 MG PO TABS: 40.0000 mg | ORAL_TABLET | Freq: Every day | ORAL | Status: DC

## 2016-05-25 NOTE — Discharge Summary (Signed)
Physician Discharge Summary  Michelle Buck [id number] DOB: 1936-06-15 DOA: 05/21/2016  PCP: Michelle Chandler, NP  Admit date: 05/21/2016 Discharge date: 05/25/2016  Admitted From: Home  Disposition:  Home with [facility]  Recommendations for Outpatient Follow-up:  1. Follow up with PCP in 1 weeks 2. Please obtain BMP/CBC in one week if considered appropriate   Home Health: YES - PT Equipment/Devices: NONE  Discharge Condition: Stable CODE STATUS: Full Diet recommendation: DM, low carbohydrate   Brief Summary: [name] is a 80yo woman who was initially admitted with wheezing for about a month, cough productive of yellowish sputum, congestion and SOB.  She was using her inhalers frequently and would only get short term relief.  She was admitted and given nebulizers, steroids and started on doxycycline and she slowly improved.  She had a difficult time getting off of oxygen, but on day of discharge, she ambulated without oxygen and maintained her O2 saturation > 92%, so she did not qualify for home oxygen.  PT evaluated her and recommended home health PT.  Her main issue on discharge was a continuous cough which was sometimes productive.  With Mucinex, she was starting to bring up some sputum.  She was due to have 1 more dose of doxycycline and 3 more days of steroids at home (extended steroid course to 7 days, given her continued severe coughing/bronchitis symptoms).     Discharge Diagnoses:   Acute on chronic respiratory failure due to COPD exacerbation: Oxygen saturations were down in to the high 80s on admission.  She was placed on oxygen and given nebulizer therapy, doxycycline and steroids.  She slowly improved. She continued to have subjective SOB when walking and coughing and so was left on [facility] for much of her stay.  On day of discharge, she tolerated walking without oxygen very well.  She was sent home to complete a 5 day course of doxycycline and a 7 day course of steroids.   She was also advised to take Mucinex-DM to help with her congestion and cough. Her home medications for COPD were unchanged.    Diabetes mellitus type 2 in obese: Sugars relatively well controlled on home insulin doses while in hospital. She was sent home on home regimen of medications.      Essential hypertension, benign: Discharged on home medications.   Chronic kidney disease (CKD), stage IV (severe): Cr ranges from 1.8 - 2.0 on review.  Her Cr remained between 2.0 and 2.2 while in house.  This is likely her baseline.  Continued control of BP and DM is warranted to prevent further worsening.   Lumbago: This issue somewhat limited her ambulation.  She was continued on her home dose of hydrocodone with good results.    Depression: She reported taking both duloxetine and sertraline.  She is on low doses of both, but this would be a concern for serotonin syndrome.  Follow up outpatient.  Perhaps she is doing a cross taper at this time.    Discharge Instructions     Medication List    TAKE these medications        albuterol 108 (90 Base) MCG/ACT inhaler  Commonly known as:  PROVENTIL HFA;VENTOLIN HFA  Inhale 2 puffs into the lungs every 4 (four) hours as needed for wheezing or shortness of breath.     AMITIZA 24 MCG capsule  Generic drug:  lubiprostone  TAKE 1 CAPSULE BY MOUTH 2 TIMES A DAY WITH MEALS     aspirin EC  81 MG tablet  Take 81 mg by mouth daily.     budesonide 0.25 MG/2ML nebulizer solution  Commonly known as:  PULMICORT  Take one ampule in Neb twice daily routinely     cilostazol 100 MG tablet  Commonly known as:  PLETAL  Take 100 mg by mouth daily.     cloNIDine 0.1 MG tablet  Commonly known as:  CATAPRES  TAKE 1 TABLET BY MOUTH EVERY DAY     cycloSPORINE 0.05 % ophthalmic emulsion  Commonly known as:  RESTASIS  Place 1 drop into both eyes 2 (two) times daily. One drop once a day bilaterally for dry eyes     dextromethorphan-guaiFENesin 30-600 MG 12hr tablet   Commonly known as:  MUCINEX DM  Take 2 tablets by mouth 2 (two) times daily.     diclofenac sodium 1 % Gel  Commonly known as:  VOLTAREN  APPLY 4 GRAM TOPICALLY TWICE DAILY as needed for pain     doxycycline 100 MG tablet  Commonly known as:  VIBRA-TABS  Take 1 tablet (100 mg total) by mouth 2 (two) times daily.     DULoxetine 30 MG capsule  Commonly known as:  CYMBALTA  TAKE 1 CAPSULE BY MOUTH EVERY DAY TO HELP ANXIETY AND PAINS     EASY TOUCH INSULIN SYRINGE 31G X 5/16" 0.5 ML Misc  Generic drug:  Insulin Syringe-Needle U-100  USE UP TO 2 TIMES A DAY AS DIRECTED     enalapril 10 MG tablet  Commonly known as:  VASOTEC  TAKE 1 TABLET BY MOUTH ONCE DAILY FOR BLOOD PRESSURE AND STRENGTHEN HEART     gabapentin 100 MG capsule  Commonly known as:  NEURONTIN  TAKE 2 CAPSULES BY MOUTH THREE TIMES A DAY     guaifenesin 100 MG/5ML syrup  Commonly known as:  ROBITUSSIN  Take 200 mg by mouth 3 (three) times daily as needed for cough.     HUMULIN 70/30 (70-30) 100 UNIT/ML injection  Generic drug:  insulin NPH-regular Human  INJECT 40 UNITS SUBCUTANEOUSLY WITH BREAKFAST AND 20 UNITS SUBCUTANEOUSLY WITH DINNER TO CONTROL BLOOD SUGAR.     hydrALAZINE 50 MG tablet  Commonly known as:  APRESOLINE  TAKE 1 TABLET BY MOUTH TWICE DAILY     HYDROcodone-acetaminophen 10-325 MG tablet  Commonly known as:  NORCO  Take 1 tablet by mouth 3 (three) times daily.     ipratropium-albuterol 0.5-2.5 (3) MG/3ML Soln  Commonly known as:  DUONEB  Take 58mls by nebulization every 6 hours as needed for SOB/Wheezing     metoprolol succinate 25 MG 24 hr tablet  Commonly known as:  TOPROL-XL  TAKE 1 TABLET BY MOUTH EVERY DAY WITH OR IMMEDIATELY FOLLOWING A MEAL FOR BLOOD PRESSURE     NEXIUM 40 MG capsule  Generic drug:  esomeprazole  TAKE 1 CAPSULE BY MOUTH ONCE DAILY     polyethylene glycol packet  Commonly known as:  MIRALAX / GLYCOLAX  DISSOLVE 17 GRAMS (1 PACKET) INTO 8 OUNCES OF LIQUID AND  DRINK EVERY DAY     potassium chloride SA 20 MEQ tablet  Commonly known as:  K-DUR,KLOR-CON  TAKE 1 TABLET BY MOUTH EVERY DAY     predniSONE 20 MG tablet  Commonly known as:  DELTASONE  Take 2 tablets (40 mg total) by mouth daily with breakfast.     sertraline 50 MG tablet  Commonly known as:  ZOLOFT  TAKE 1 TABLET BY MOUTH EVERY DAY     simvastatin  20 MG tablet  Commonly known as:  ZOCOR  TAKE 1 TABLET BY MOUTH EVERY DAY     sucralfate 1 g tablet  Commonly known as:  CARAFATE  TAKE 1 TABLET BY MOUTH TWICE DAILY ON EMPTY STOMACH.     timolol 0.5 % ophthalmic solution  Commonly known as:  TIMOPTIC  INSTILL 1 DROP INTO EACH EYE TWICE A DAY     torsemide 20 MG tablet  Commonly known as:  DEMADEX  Take 1 tablet by mouth daily for edema.     TRAVATAN Z 0.004 % Soln ophthalmic solution  Generic drug:  Travoprost (BAK Free)  Place 1 drop into both eyes at bedtime.     Vitamin D3 2000 units Tabs  Take 2,000 Units by mouth daily.           Follow-up Information    Follow up with Dewaine Oats, JESSICA K, NP. Schedule an appointment as soon as possible for a visit in 1 week.   Specialty:  Geriatric Medicine   Contact information:   [address] [place] [address] [phone]      Allergies  Allergen Reactions  . Tramadol Other (See Comments)    Leg cramps   . Codeine Nausea And Vomiting    Patient states N/V with codeine  . Hydrocodone Nausea And Vomiting  . Oxycodone Other (See Comments)    Patient states she can tolerate oxycodone  . Tylenol [Acetaminophen] Hives  . Penicillins Rash    Patient states rash/itch with penicillin Has patient had a PCN reaction causing immediate rash, facial/tongue/throat swelling, SOB or lightheadedness with hypotension: Yes- broke me out and i was itching Has patient had a PCN reaction causing severe rash involving mucus membranes or skin necrosis: No Has patient had a PCN reaction that required hospitalization Yes- i was  already in the hospital Has patient had a PCN reaction occurring within the last 10 years: No If all of the above answers are "NO    Consultations:  PT   Procedures/Studies: Dg Chest 2 View  05/21/2016  CLINICAL DATA:  Productive cough, SOB, and chest pain x1 month. Hx of HTN, diabetes, CHF. Former smoker x19 years ago. EXAM: CHEST  2 VIEW COMPARISON:  Multiple prior studies including 01/15/2016 FINDINGS: Heart is upper normal in size. There is focal eventration of the right hemidiaphragm, stable in appearance. Hiatal hernia is present. Mild bronchitic changes are noted. There are no focal consolidations or pleural effusions. No pulmonary edema. Mid thoracic spondylosis is noted. IMPRESSION: No evidence for acute cardiopulmonary abnormality. Electronically Signed   By: Nolon Nations M.D.   On: 05/21/2016 13:08     Subjective:   Discharge Exam: Filed Vitals:   05/24/16 2015 05/25/16 0444  BP: 149/72 114/70  Pulse: 66 53  Temp: 98.6 F (37 C) 98.5 F (36.9 C)  Resp: 18 14   Filed Vitals:   05/24/16 2015 05/24/16 2111 05/25/16 0444 05/25/16 0724  BP: 149/72  114/70   Pulse: 66  53   Temp: 98.6 F (37 C)  98.5 F (36.9 C)   TempSrc: Oral  Oral   Resp: 18  14   Height:      Weight:   221 lb 12.5 oz (100.6 kg)   SpO2: 92% 95% 94% 92%     General exam: Appears calm and comfortable, [place] in place, 1L HENT: MMM, clear oropharynx, coughing up white sputum.  Respiratory system: Mostly clear to auscultation, with decreased breath sounds throughout, some rhonchi  in bases. Otherwise no wheezing Cardiovascular system: S1 & S2 heard, RR, NR.  Gastrointestinal system: Abdomen is nondistended, soft and nontender. Normal bowel sounds heard. Neuro: Alert and oriented. No focal neurological deficits. Skin: No lesions or ulcers noted Psychiatry: Judgement and insight appear normal. Mood & affect appropriate.     The results of significant diagnostics from this hospitalization  (including imaging, microbiology, ancillary and laboratory) are listed below for reference.     Microbiology: No results found for this or any previous visit (from the past 240 hour(s)).   Labs: BNP (last 3 results)  Recent Labs  12/28/15 1424 01/01/16 1023 05/21/16 1623  BNP 25.5 41.6 [id number]   Basic Metabolic Panel:  Recent Labs Lab 05/21/16 1627 05/22/16 0344 05/23/16 0346 05/24/16 0422  NA 144 139 138 139  K 3.6 4.0 3.9 3.6  CL 105 104 105 107  CO2  --  24 26 25   GLUCOSE 126* 260* 176* 140*  BUN 27* 27* 30* 35*  CREATININE 2.30* 2.32* 2.10* 2.28*  CALCIUM  --  8.5* 8.9 8.7*   CBC:  Recent Labs Lab 05/21/16 1622 05/21/16 1627 05/22/16 0344 05/23/16 0346 05/24/16 0422  WBC 8.1  --  6.6 11.4* 10.1  NEUTROABS 4.5  --   --   --   --   HGB 10.8* 12.2 10.5* 10.0* 10.0*  HCT 33.6* 36.0 33.0* 31.0* 31.3*  MCV 81.0  --  81.7 78.7 81.5  PLT 255  --  266 254 254   Cardiac Enzymes: No results for input(s): CKTOTAL, CKMB, CKMBINDEX, TROPONINI in the last 168 hours. BNP: Invalid input(s): POCBNP CBG:  Recent Labs Lab 05/24/16 0740 05/24/16 1206 05/24/16 1641 05/24/16 2154 05/25/16 0723  GLUCAP 131* 138* 74 93 70   Urinalysis    Component Value Date/Time   COLORURINE YELLOW 12/28/2015 [place] 12/28/2015 1305   LABSPEC 1.007 12/28/2015 1305   PHURINE 5.0 12/28/2015 1305   GLUCOSEU NEGATIVE 12/28/2015 1305   HGBUR TRACE* 12/28/2015 [place] 12/28/2015 1305   [place] 12/28/2015 [place] 12/28/2015 1305   UROBILINOGEN 0.2 02/14/2008 0947   NITRITE NEGATIVE 12/28/2015 1305   LEUKOCYTESUR NEGATIVE 12/28/2015 1305     Time coordinating discharge: 40 minutes  SIGNED:   Gilles Chiquito, MD  [facility] 05/25/2016, 11:08 AM Pager [phone]  If 7PM-7AM, please contact night-coverage [email or web address] Password [email or web address]

## 2016-05-25 NOTE — Care Management Important Message (Signed)
Important Message  Patient Details  Name: Michelle Buck MRN: [id number] Date of Birth: May 16, 1936   Medicare Important Message Given:  Yes    Camillo Flaming 05/25/2016, 11:29 AMImportant Message  Patient Details  Name: Michelle Buck MRN: [id number] Date of Birth: 1936/08/01   Medicare Important Message Given:  Yes    Camillo Flaming 05/25/2016, 11:29 AM

## 2016-05-25 NOTE — Progress Notes (Signed)
Pt discharged home with brother in stable condition. Discharge instructions given. Scripts sent to pharmacy of choice.  Pt verbalized understanding.

## 2016-05-25 NOTE — Progress Notes (Signed)
SATURATION QUALIFICATIONS: (This note is used to comply with regulatory documentation for home oxygen)  Patient Saturations on Room Air at Rest = 96%  Patient Saturations on Room Air while Ambulating = 92%  Patient Saturations on 0 Liters of oxygen while Ambulating = 96%  Please briefly explain why patient needs home oxygen:

## 2016-05-25 NOTE — Progress Notes (Addendum)
PROGRESS NOTE    Michelle Buck  [id number] DOB: 1936-09-21 DOA: 05/21/2016 PCP: [name], NP    Brief Narrative:  HPI on 05/21/2016 by Dr. Karmen Bongo Michelle Buck is a 80 y.o. female with medical history significant of DM, HTN, CKD, and COPD (and multiple other medical problems) presenting with "wheezing carrying on" for about a month. "I tried to work on it myself but that didn't get it." Has been trying lemon and honey; Robitussin; cough drops. Has also tried breathing treatments - this would help for short periods of time but it would start back. It has gotten worse and she had to sit up all night last night because she couldn't catch her breath. Using treatments BID and inhaler about 3 times during the day. Last neb about 530pm. Cough productive of yellowish sputum. No fevers. +nasal congestion. No sick contacts. Generalized aches and pains which are chronic and unchanged. Chronic L-sided hearing loss. Mild headache intermittently. Has hiatal hernia and feels bloated. Occasional constipation.  Interim History Patient slowly improving.  Has had issues with coughing and continued SOB with walking.  She was placed back on Oxygen overnight for SOB and desaturation to 87% at 3pm on 6/25.    Assessment & Plan:   Acute on chronic respiratory failure 2/2 COPD exacerbation - Noted to maintain O2 during walking yesterday, however, became mildly hypoxic at rest (with coughing?) yesterday and was placed on 1 LNC.  She was ambulated again by nursing and saturation stayed > 92%.  Discussed with CM and she does not qualify for home O2. - On prednisone, day 5.  Given persistent symptoms, will extend to 7 day course - On Doxycycline Day 5.  Complete course this evening.  - Home pulmicort restarted yesterday - Continue duonebs - She has pleuritic chest pain with coughing, voltaren gel trial yesterday, she reports no great improvement today.  - Continue cough syrup - Main issue  keeping her in house is a persistent cough and some mild hypoxia (< 88%) requiring a small amount of Oxygen.  She is likely ready for discharge with appropriate support at home.   Leukocytosis - No acute signs of infection, CXR without acute infiltrate, afebrile - Likely related to steroids  chronic diastolic congestive heart failure  - Euvolemic - Daily weights - Strict I/O    Diabetes mellitus type 2 in obese with peripheral neuropathy - cointinue 70/30 40 in the AM and 25 in the PM, SSI - CBGs 70s-130s    Essential hypertension, benign - BP improved today to 114/70  - Continue home regimen of metoprolol, clonidine, hydralazine    Chronic kidney disease (CKD), stage IV (severe) (HCC) - Baseline 1.8 to 2, which is close to where she has been in house.  - Continue to monitor  Lumbago and hip pain - Worsened with walking.  She reports that the pain makes her more SOB than her cough.  - Pain control with hydrocone    PAD (peripheral artery disease) (HCC) - Continue pletal and aspirin  Depression - Continue zoloft and cymbalta - ? Concern re serotonin syndrome.  Monitor  AOCD - At baseline, monitor  ASCVD - Continue metoprolol, aspirin, statin   DVT prophylaxis: Lovenox Code Status: Full Family Communication: Non at bedside Disposition Plan: Discharge today with home health PT   Consultants:   DM Coordinator  Procedures:  None  Antimicrobials:   Doxycycline Day 5/5   Subjective: Patient is improving.  Coughing and with some mild hypoxia with  coughing.  Will discuss with CM about O2 needs at home.   Objective: Filed Vitals:   05/24/16 1523 05/24/16 2015 05/24/16 2111 05/25/16 0444  BP:  149/72  114/70  Pulse:  66  53  Temp:  98.6 F (37 C)  98.5 F (36.9 C)  TempSrc:  Oral  Oral  Resp:  18  14  Height:      Weight:    221 lb 12.5 oz (100.6 kg)  SpO2: 87% 92% 95% 94%    Intake/Output Summary (Last 24 hours) at 05/25/16 0717 Last data filed at  05/24/16 1700  Gross per 24 hour  Intake    480 ml  Output      0 ml  Net    480 ml   Filed Weights   05/21/16 2045 05/25/16 0444  Weight: 217 lb 6 oz (98.6 kg) 221 lb 12.5 oz (100.6 kg)    Examination:  General exam: Appears calm and comfortable, [place] in place, 1L HENT: MMM, clear oropharynx, coughing up white sputum.  Respiratory system: Mostly clear to auscultation, with decreased breath sounds throughout, some rhonchi in bases.  Otherwise no wheezing Cardiovascular system: S1 & S2 heard, RR, NR.  Gastrointestinal system: Abdomen is nondistended, soft and nontender.  Normal bowel sounds heard. Neuro: Alert and oriented. No focal neurological deficits. Skin: No lesions or ulcers noted Psychiatry: Judgement and insight appear normal. Mood & affect appropriate.     Data Reviewed:  CBC:  Recent Labs Lab 05/21/16 1622 05/21/16 1627 05/22/16 0344 05/23/16 0346 05/24/16 0422  WBC 8.1  --  6.6 11.4* 10.1  NEUTROABS 4.5  --   --   --   --   HGB 10.8* 12.2 10.5* 10.0* 10.0*  HCT 33.6* 36.0 33.0* 31.0* 31.3*  MCV 81.0  --  81.7 78.7 81.5  PLT 255  --  266 254 0000000   Basic Metabolic Panel:  Recent Labs Lab 05/21/16 1627 05/22/16 0344 05/23/16 0346 05/24/16 0422  NA 144 139 138 139  K 3.6 4.0 3.9 3.6  CL 105 104 105 107  CO2  --  24 26 25   GLUCOSE 126* 260* 176* 140*  BUN 27* 27* 30* 35*  CREATININE 2.30* 2.32* 2.10* 2.28*  CALCIUM  --  8.5* 8.9 8.7*   CBG:  Recent Labs Lab 05/23/16 2137 05/24/16 0740 05/24/16 1206 05/24/16 1641 05/24/16 2154  GLUCAP 127* 131* 138* 74 93      Radiology Studies: No results found.    Scheduled Meds: . acetaminophen  650 mg Oral Once  . albuterol  10 mg/hr Nebulization Once  . aspirin EC  81 mg Oral Daily  . budesonide (PULMICORT) nebulizer solution  0.25 mg Nebulization BID  . cilostazol  100 mg Oral Daily  . cloNIDine  0.1 mg Oral Daily  . cycloSPORINE  1 drop Both Eyes BID  . dextromethorphan-guaiFENesin  2  tablet Oral BID  . diclofenac sodium  2 g Topical QID  . docusate sodium  100 mg Oral BID  . doxycycline  100 mg Oral Q12H  . DULoxetine  30 mg Oral Daily  . enoxaparin (LOVENOX) injection  30 mg Subcutaneous Q24H  . gabapentin  200 mg Oral TID  . hydrALAZINE  50 mg Oral BID  . insulin aspart  0-9 Units Subcutaneous TID WC  . insulin aspart protamine- aspart  25 Units Subcutaneous Q supper  . insulin aspart protamine- aspart  40 Units Subcutaneous Q breakfast  . ipratropium-albuterol  3 mL Nebulization  TID  . latanoprost  1 drop Both Eyes QHS  . lubiprostone  24 mcg Oral BID WC  . metoprolol succinate  25 mg Oral Daily  . pantoprazole  40 mg Oral Daily  . potassium chloride SA  20 mEq Oral Daily  . predniSONE  40 mg Oral Q breakfast  . senna  1 tablet Oral BID  . sertraline  50 mg Oral Daily  . simvastatin  20 mg Oral Daily  . sodium chloride flush  3 mL Intravenous Q12H  . sucralfate  1 g Oral BID  . timolol  1 drop Both Eyes BID   Continuous Infusions:    LOS: 3 days    Time spent: 25 minutes    Gilles Chiquito, MD [facility] Pager [phone]  If 7PM-7AM, please contact night-coverage [email or web address] Password [facility] 05/25/2016, 7:17 AM

## 2016-05-25 NOTE — Discharge Instructions (Addendum)
Michelle Buck - -  You were diagnosed with a COPD exacerbation and you were treated with steroids and antibiotics.  You will likely continue to have a cough for a couple of weeks.  You will also be prescribed a cough medication which you can take twice a day.    Your new medications are   Doxycycline twice a day.  Take for 2 more doses at home.  Prednisone 40mg  daily.  Take for 3 more days Robitussin oral tablets, take as needed twice a day.   Please call and make an appointment with your PCP in 1 week to see how you are doing.   You will have a physical therapist come to your home to help you get some more strength.  Please schedule with them as soon as possible.   You had some shortness of breath while in the hospital, but were checked multiple times and you do not require oxygen at home.    Follow with Primary MD Dewaine Oats, JESSICA K, NP in 7 days   Get CBC, CMP, 2 view Chest X ray checked at your appointment if appropriate.     Activity: As tolerated    Disposition Home    Diet:   Low carb   On your next visit with your primary care physician please Get Medicines reviewed and adjusted.   Please request your Primary physician to go over all Hospital Tests and Procedure/Radiological results at the follow up, please get all Hospital records sent to your Primary physician by signing hospital release before you go home.   If you experience worsening of your admission symptoms, develop shortness of breath, life threatening emergency, suicidal or homicidal thoughts you must seek medical attention immediately by calling 911 or calling your MD immediately  if symptoms less severe.  You Must read complete instructions/literature along with all the possible adverse reactions/side effects for all the Medicines you take and that have been prescribed to you. Take any new Medicines after you have completely understood and accpet all the possible adverse reactions/side effects.   Do not drive  when taking Pain medications.   Do not take more than prescribed Pain, Sleep and Anxiety Medications  Special Instructions: If you have smoked or chewed Tobacco  in the last 2 yrs please stop smoking, stop any regular Alcohol  and or any Recreational drug use.  Wear Seat belts while driving.   Please note  You were cared for by a hospitalist during your hospital stay. If you have any questions about your discharge medications or the care you received while you were in the hospital after you are discharged, you can call the unit and asked to speak with the hospitalist on call if the hospitalist that took care of you is not available. Once you are discharged, your primary care physician will handle any further medical issues. Please note that NO REFILLS for any discharge medications will be authorized once you are discharged, as it is imperative that you return to your primary care physician (or establish a relationship with a primary care physician if you do not have one) for your aftercare needs so that they can reassess your need for medications and monitor your lab values.

## 2016-05-25 NOTE — Care Management Note (Signed)
Case Management Note  Patient Details  Name: Michelle Buck MRN: [id number] Date of Birth: 1936-04-15  Subjective/Objective:     80 yo admitted with Acute on Chronic Resp failure               Action/Plan: From home alone. Pt has private duty aides from [employer]. PT recommendations discussed with pt and pt offered choice for [facility] services.  [facility] chosen for HHPT. [facility] rep contacted for referral. Pt does not use 02 at home and does not currently qualify for home 02 per nursing staff.  No other CM needs communicated.  Expected Discharge Date:                  Expected Discharge Plan:     In-House Referral:     Discharge planning Services     Post Acute Care Choice:    Choice offered to:     DME Arranged:    DME Agency:     HH Arranged:    HH Agency:     Status of Service:     If discussed at [employer], dates discussed:    Additional CommentsLynnell Catalan, RN 05/25/2016, 9:35 AM  [phone]

## 2016-05-27 DIAGNOSIS — E1151 Type 2 diabetes mellitus with diabetic peripheral angiopathy without gangrene: Secondary | ICD-10-CM | POA: Diagnosis not present

## 2016-05-27 DIAGNOSIS — E1142 Type 2 diabetes mellitus with diabetic polyneuropathy: Secondary | ICD-10-CM | POA: Diagnosis not present

## 2016-05-27 DIAGNOSIS — J441 Chronic obstructive pulmonary disease with (acute) exacerbation: Secondary | ICD-10-CM | POA: Diagnosis not present

## 2016-05-27 DIAGNOSIS — Z794 Long term (current) use of insulin: Secondary | ICD-10-CM | POA: Diagnosis not present

## 2016-05-27 DIAGNOSIS — K219 Gastro-esophageal reflux disease without esophagitis: Secondary | ICD-10-CM | POA: Diagnosis not present

## 2016-05-27 DIAGNOSIS — E1122 Type 2 diabetes mellitus with diabetic chronic kidney disease: Secondary | ICD-10-CM | POA: Diagnosis not present

## 2016-05-27 DIAGNOSIS — Z7982 Long term (current) use of aspirin: Secondary | ICD-10-CM | POA: Diagnosis not present

## 2016-05-27 DIAGNOSIS — I130 Hypertensive heart and chronic kidney disease with heart failure and stage 1 through stage 4 chronic kidney disease, or unspecified chronic kidney disease: Secondary | ICD-10-CM | POA: Diagnosis not present

## 2016-05-27 DIAGNOSIS — E785 Hyperlipidemia, unspecified: Secondary | ICD-10-CM | POA: Diagnosis not present

## 2016-05-27 DIAGNOSIS — I5033 Acute on chronic diastolic (congestive) heart failure: Secondary | ICD-10-CM | POA: Diagnosis not present

## 2016-05-27 DIAGNOSIS — N184 Chronic kidney disease, stage 4 (severe): Secondary | ICD-10-CM | POA: Diagnosis not present

## 2016-05-27 DIAGNOSIS — F419 Anxiety disorder, unspecified: Secondary | ICD-10-CM | POA: Diagnosis not present

## 2016-05-27 DIAGNOSIS — M170 Bilateral primary osteoarthritis of knee: Secondary | ICD-10-CM | POA: Diagnosis not present

## 2016-05-29 ENCOUNTER — Telehealth: Payer: Self-pay

## 2016-05-29 ENCOUNTER — Other Ambulatory Visit: Payer: Self-pay

## 2016-05-29 DIAGNOSIS — I5033 Acute on chronic diastolic (congestive) heart failure: Secondary | ICD-10-CM | POA: Diagnosis not present

## 2016-05-29 DIAGNOSIS — M170 Bilateral primary osteoarthritis of knee: Secondary | ICD-10-CM | POA: Diagnosis not present

## 2016-05-29 DIAGNOSIS — N184 Chronic kidney disease, stage 4 (severe): Secondary | ICD-10-CM | POA: Diagnosis not present

## 2016-05-29 DIAGNOSIS — I130 Hypertensive heart and chronic kidney disease with heart failure and stage 1 through stage 4 chronic kidney disease, or unspecified chronic kidney disease: Secondary | ICD-10-CM | POA: Diagnosis not present

## 2016-05-29 DIAGNOSIS — E1122 Type 2 diabetes mellitus with diabetic chronic kidney disease: Secondary | ICD-10-CM | POA: Diagnosis not present

## 2016-05-29 DIAGNOSIS — J441 Chronic obstructive pulmonary disease with (acute) exacerbation: Secondary | ICD-10-CM | POA: Diagnosis not present

## 2016-05-29 MED ORDER — BUDESONIDE 0.25 MG/2ML IN SUSP: RESPIRATORY_TRACT | Status: DC

## 2016-05-29 NOTE — Telephone Encounter (Signed)
[facility] pharmacy called, they received the prescription for Pulmicort, but because of Medicare they need diagnosis on it, can't take verbal need a new Rx. Also, if it's for 30 day supply need to change the amount from 60 ml to 120 ml.

## 2016-06-01 ENCOUNTER — Ambulatory Visit: Payer: Self-pay | Admitting: Pharmacotherapy

## 2016-06-01 DIAGNOSIS — N184 Chronic kidney disease, stage 4 (severe): Secondary | ICD-10-CM | POA: Diagnosis not present

## 2016-06-01 DIAGNOSIS — I5033 Acute on chronic diastolic (congestive) heart failure: Secondary | ICD-10-CM | POA: Diagnosis not present

## 2016-06-01 DIAGNOSIS — M170 Bilateral primary osteoarthritis of knee: Secondary | ICD-10-CM | POA: Diagnosis not present

## 2016-06-01 DIAGNOSIS — E1122 Type 2 diabetes mellitus with diabetic chronic kidney disease: Secondary | ICD-10-CM | POA: Diagnosis not present

## 2016-06-01 DIAGNOSIS — J441 Chronic obstructive pulmonary disease with (acute) exacerbation: Secondary | ICD-10-CM | POA: Diagnosis not present

## 2016-06-01 DIAGNOSIS — I130 Hypertensive heart and chronic kidney disease with heart failure and stage 1 through stage 4 chronic kidney disease, or unspecified chronic kidney disease: Secondary | ICD-10-CM | POA: Diagnosis not present

## 2016-06-03 DIAGNOSIS — I130 Hypertensive heart and chronic kidney disease with heart failure and stage 1 through stage 4 chronic kidney disease, or unspecified chronic kidney disease: Secondary | ICD-10-CM | POA: Diagnosis not present

## 2016-06-03 DIAGNOSIS — N184 Chronic kidney disease, stage 4 (severe): Secondary | ICD-10-CM | POA: Diagnosis not present

## 2016-06-03 DIAGNOSIS — M170 Bilateral primary osteoarthritis of knee: Secondary | ICD-10-CM | POA: Diagnosis not present

## 2016-06-03 DIAGNOSIS — E1122 Type 2 diabetes mellitus with diabetic chronic kidney disease: Secondary | ICD-10-CM | POA: Diagnosis not present

## 2016-06-03 DIAGNOSIS — I5033 Acute on chronic diastolic (congestive) heart failure: Secondary | ICD-10-CM | POA: Diagnosis not present

## 2016-06-03 DIAGNOSIS — J441 Chronic obstructive pulmonary disease with (acute) exacerbation: Secondary | ICD-10-CM | POA: Diagnosis not present

## 2016-06-04 DIAGNOSIS — M170 Bilateral primary osteoarthritis of knee: Secondary | ICD-10-CM | POA: Diagnosis not present

## 2016-06-04 DIAGNOSIS — I5033 Acute on chronic diastolic (congestive) heart failure: Secondary | ICD-10-CM | POA: Diagnosis not present

## 2016-06-04 DIAGNOSIS — I130 Hypertensive heart and chronic kidney disease with heart failure and stage 1 through stage 4 chronic kidney disease, or unspecified chronic kidney disease: Secondary | ICD-10-CM | POA: Diagnosis not present

## 2016-06-04 DIAGNOSIS — J441 Chronic obstructive pulmonary disease with (acute) exacerbation: Secondary | ICD-10-CM | POA: Diagnosis not present

## 2016-06-04 DIAGNOSIS — N184 Chronic kidney disease, stage 4 (severe): Secondary | ICD-10-CM | POA: Diagnosis not present

## 2016-06-04 DIAGNOSIS — E1122 Type 2 diabetes mellitus with diabetic chronic kidney disease: Secondary | ICD-10-CM | POA: Diagnosis not present

## 2016-06-08 ENCOUNTER — Ambulatory Visit (INDEPENDENT_AMBULATORY_CARE_PROVIDER_SITE_OTHER): Payer: Medicare Other | Admitting: Pharmacotherapy

## 2016-06-08 ENCOUNTER — Encounter: Payer: Self-pay | Admitting: Pharmacotherapy

## 2016-06-08 ENCOUNTER — Other Ambulatory Visit: Payer: Self-pay

## 2016-06-08 VITALS — BP 110/72 | HR 60 | Temp 98.2°F | Resp 20 | Ht 62.0 in | Wt 219.6 lb

## 2016-06-08 DIAGNOSIS — M170 Bilateral primary osteoarthritis of knee: Secondary | ICD-10-CM | POA: Diagnosis not present

## 2016-06-08 DIAGNOSIS — E1122 Type 2 diabetes mellitus with diabetic chronic kidney disease: Secondary | ICD-10-CM | POA: Diagnosis not present

## 2016-06-08 DIAGNOSIS — M1990 Unspecified osteoarthritis, unspecified site: Secondary | ICD-10-CM | POA: Diagnosis not present

## 2016-06-08 DIAGNOSIS — I130 Hypertensive heart and chronic kidney disease with heart failure and stage 1 through stage 4 chronic kidney disease, or unspecified chronic kidney disease: Secondary | ICD-10-CM | POA: Diagnosis not present

## 2016-06-08 DIAGNOSIS — E1121 Type 2 diabetes mellitus with diabetic nephropathy: Secondary | ICD-10-CM | POA: Diagnosis not present

## 2016-06-08 DIAGNOSIS — I5033 Acute on chronic diastolic (congestive) heart failure: Secondary | ICD-10-CM | POA: Diagnosis not present

## 2016-06-08 DIAGNOSIS — Z794 Long term (current) use of insulin: Secondary | ICD-10-CM

## 2016-06-08 DIAGNOSIS — J441 Chronic obstructive pulmonary disease with (acute) exacerbation: Secondary | ICD-10-CM | POA: Diagnosis not present

## 2016-06-08 DIAGNOSIS — I10 Essential (primary) hypertension: Secondary | ICD-10-CM

## 2016-06-08 DIAGNOSIS — N184 Chronic kidney disease, stage 4 (severe): Secondary | ICD-10-CM | POA: Diagnosis not present

## 2016-06-08 LAB — HEMOGLOBIN A1C
Hgb A1c MFr Bld: 7 % — ABNORMAL HIGH (ref <5.7)
Mean Plasma Glucose: 154 mg/dL

## 2016-06-08 LAB — COMPLETE METABOLIC PANEL WITH GFR
ALT: 7 U/L (ref 6–29)
AST: 14 U/L (ref 10–35)
Albumin: 3.5 g/dL — ABNORMAL LOW (ref 3.6–5.1)
Alkaline Phosphatase: 78 U/L (ref 33–130)
BUN: 22 mg/dL (ref 7–25)
CO2: 28 mmol/L (ref 20–31)
Calcium: 8.3 mg/dL — ABNORMAL LOW (ref 8.6–10.4)
Chloride: 104 mmol/L (ref 98–110)
Creat: 2.07 mg/dL — ABNORMAL HIGH (ref 0.60–0.93)
GFR, Est African American: 26 mL/min — ABNORMAL LOW (ref >=60)
GFR, Est Non African American: 22 mL/min — ABNORMAL LOW (ref >=60)
Glucose, Bld: 71 mg/dL (ref 65–99)
Potassium: 3.6 mmol/L (ref 3.5–5.3)
Sodium: 143 mmol/L (ref 135–146)
Total Bilirubin: 0.3 mg/dL (ref 0.2–1.2)
Total Protein: 6.2 g/dL (ref 6.1–8.1)

## 2016-06-08 LAB — CBC WITH DIFFERENTIAL/PLATELET
Basophils Absolute: 0 cells/uL (ref 0–200)
Basophils Relative: 0 %
Eosinophils Absolute: 350 cells/uL (ref 15–500)
Eosinophils Relative: 5 %
HCT: 31.7 % — ABNORMAL LOW (ref 35.0–45.0)
Hemoglobin: 10.2 g/dL — ABNORMAL LOW (ref 11.7–15.5)
Lymphocytes Relative: 23 %
Lymphs Abs: 1610 cells/uL (ref 850–3900)
MCH: 26.2 pg — ABNORMAL LOW (ref 27.0–33.0)
MCHC: 32.2 g/dL (ref 32.0–36.0)
MCV: 81.5 fL (ref 80.0–100.0)
MPV: 10 fL (ref 7.5–12.5)
Monocytes Absolute: 420 cells/uL (ref 200–950)
Monocytes Relative: 6 %
Neutro Abs: 4620 cells/uL (ref 1500–7800)
Neutrophils Relative %: 66 %
Platelets: 222 10*3/uL (ref 140–400)
RBC: 3.89 MIL/uL (ref 3.80–5.10)
RDW: 16.6 % — ABNORMAL HIGH (ref 11.0–15.0)
WBC: 7 10*3/uL (ref 3.8–10.8)

## 2016-06-08 MED ORDER — DICLOFENAC SODIUM 1 % TD GEL: TRANSDERMAL | Status: DC

## 2016-06-08 MED ORDER — DICLOFENAC SODIUM 1 % TD GEL
TRANSDERMAL | Status: DC
Start: 2016-06-08 — End: 2016-06-08

## 2016-06-08 NOTE — Addendum Note (Signed)
Addended by: Marisa Cyphers C on: 06/08/2016 10:27 AM   Modules accepted: Orders, SmartSet

## 2016-06-08 NOTE — Progress Notes (Signed)
  Subjective:    Michelle Buck is a 80 y.o.African American female who presents for follow-up of Type 2 diabetes mellitus.   She was in [facility] 05/21/16 - 05/25/16 for COPD exacerbation She was discharged on a steroid dose pack.  Last A1C was 7.5% on 02/26/16  Home BG values: 112-162 fasting Home BP:  99/58, 114/55 Her home weight is averaging 220lb  She is complaining of generalized pain. Her current insulin dose is 40 units in the morning and 25 units at night.  She is eating "pretty good" No routine exercise. Denies problems with vision Her feet hurt - especially her right great toe. She does have peripheral edema. Nocturia every hour during the night. No dysuria. Staying well hydrated.   Review of Systems A comprehensive review of systems was negative except for: Constitutional: positive for fatigue and generalized pain Respiratory: positive for chronic bronchitis and dyspnea on exertion Cardiovascular: positive for dyspnea and lower extremity edema    Objective:    BP 110/72 mmHg  Pulse 60  Temp(Src) 98.2 F (36.8 C) (Oral)  Resp 20  Ht 5' 2"$  (1.575 m)  Wt 219 lb 9.6 oz (99.61 kg)  BMI 40.16 kg/m2  SpO2 93%  General:  alert, cooperative and mild distress  Oropharynx: normal findings: lips normal without lesions and gums healthy   Eyes:  negative findings: lids and lashes normal and conjunctivae and sclerae normal   Ears:  external ears normal        Lung: wheezes bilaterally  Heart:  regular rate and rhythm     Extremities: edema bilateral lower exremities  Skin: dry     Neuro: mental status, speech normal, alert and oriented x3   Lab Review GLUCOSE (mg/dL)  Date Value  02/26/2016 132*  01/07/2016 112*  10/15/2015 148*   GLUCOSE, BLD (mg/dL)  Date Value  05/24/2016 140*  05/23/2016 176*  05/22/2016 260*   CO2 (mmol/L)  Date Value  05/24/2016 25  05/23/2016 26  05/22/2016 24   BUN (mg/dL)  Date Value  05/24/2016 35*  05/23/2016 30*   05/22/2016 27*  02/26/2016 27  01/07/2016 21  10/15/2015 27   CREATININE, SER (mg/dL)  Date Value  05/24/2016 2.28*  05/23/2016 2.10*  05/22/2016 2.32*     Labs today:  A1C, CMP, CBC   Assessment:    Diabetes Mellitus type II, under fair control. Need updated A1C and PM BG checks. BP at goal <140/90 Recent COPD exacerbation resolved.   Plan:    1.  Rx changes: none 2.  Will continue 70/30 insulin 40 units AM and 25 units PM pending labs. 3.  Advised her to start SMBG twice daily. 4.  Counseled on nutrition goals. 5.  Counseled on importance of routine exercise.  Goal is 30-45 minutes 5 x week. 6.  Counseled on foot care. 7.  BP at goal <140/90.

## 2016-06-08 NOTE — Patient Instructions (Signed)
Try to check blood sugar in the afternoon as well as the morning.

## 2016-06-09 ENCOUNTER — Telehealth: Payer: Self-pay | Admitting: *Deleted

## 2016-06-09 ENCOUNTER — Other Ambulatory Visit: Payer: Self-pay | Admitting: Nurse Practitioner

## 2016-06-09 ENCOUNTER — Other Ambulatory Visit: Payer: Self-pay | Admitting: Internal Medicine

## 2016-06-09 DIAGNOSIS — I130 Hypertensive heart and chronic kidney disease with heart failure and stage 1 through stage 4 chronic kidney disease, or unspecified chronic kidney disease: Secondary | ICD-10-CM | POA: Diagnosis not present

## 2016-06-09 DIAGNOSIS — I5033 Acute on chronic diastolic (congestive) heart failure: Secondary | ICD-10-CM | POA: Diagnosis not present

## 2016-06-09 DIAGNOSIS — E1122 Type 2 diabetes mellitus with diabetic chronic kidney disease: Secondary | ICD-10-CM | POA: Diagnosis not present

## 2016-06-09 DIAGNOSIS — J441 Chronic obstructive pulmonary disease with (acute) exacerbation: Secondary | ICD-10-CM | POA: Diagnosis not present

## 2016-06-09 DIAGNOSIS — N184 Chronic kidney disease, stage 4 (severe): Secondary | ICD-10-CM | POA: Diagnosis not present

## 2016-06-09 DIAGNOSIS — M170 Bilateral primary osteoarthritis of knee: Secondary | ICD-10-CM | POA: Diagnosis not present

## 2016-06-09 NOTE — Telephone Encounter (Signed)
Appointment scheduled and Belenda Cruise notified and agreed and will contact patient.

## 2016-06-09 NOTE — Telephone Encounter (Signed)
Michelle Buck with [facility] called and stated that she saw patient today and her pulse is 50, patient got up and ambulated and it went up to 60 and when she sat back down it went back down to 55. Also her SPO2 starts out at 85 and then creeps up to around 91 then comes back down to around 88. Please Advise.

## 2016-06-09 NOTE — Telephone Encounter (Signed)
It sounds like she needs to be evaluated in the office BEFORE her August visit. She was recently d/c'd from hospital with pulmonary issue that req'd [place] O[facility].

## 2016-06-11 DIAGNOSIS — I130 Hypertensive heart and chronic kidney disease with heart failure and stage 1 through stage 4 chronic kidney disease, or unspecified chronic kidney disease: Secondary | ICD-10-CM | POA: Diagnosis not present

## 2016-06-11 DIAGNOSIS — E1122 Type 2 diabetes mellitus with diabetic chronic kidney disease: Secondary | ICD-10-CM | POA: Diagnosis not present

## 2016-06-11 DIAGNOSIS — Z794 Long term (current) use of insulin: Secondary | ICD-10-CM | POA: Diagnosis not present

## 2016-06-11 DIAGNOSIS — N184 Chronic kidney disease, stage 4 (severe): Secondary | ICD-10-CM | POA: Diagnosis not present

## 2016-06-11 DIAGNOSIS — I5033 Acute on chronic diastolic (congestive) heart failure: Secondary | ICD-10-CM | POA: Diagnosis not present

## 2016-06-11 DIAGNOSIS — E119 Type 2 diabetes mellitus without complications: Secondary | ICD-10-CM | POA: Diagnosis not present

## 2016-06-11 DIAGNOSIS — J441 Chronic obstructive pulmonary disease with (acute) exacerbation: Secondary | ICD-10-CM | POA: Diagnosis not present

## 2016-06-11 DIAGNOSIS — M170 Bilateral primary osteoarthritis of knee: Secondary | ICD-10-CM | POA: Diagnosis not present

## 2016-06-11 DIAGNOSIS — H401131 Primary open-angle glaucoma, bilateral, mild stage: Secondary | ICD-10-CM | POA: Diagnosis not present

## 2016-06-11 DIAGNOSIS — Z961 Presence of intraocular lens: Secondary | ICD-10-CM | POA: Diagnosis not present

## 2016-06-11 LAB — HM DIABETES EYE EXAM

## 2016-06-12 ENCOUNTER — Ambulatory Visit (INDEPENDENT_AMBULATORY_CARE_PROVIDER_SITE_OTHER): Payer: Medicare Other | Admitting: Internal Medicine

## 2016-06-12 ENCOUNTER — Encounter: Payer: Self-pay | Admitting: *Deleted

## 2016-06-12 ENCOUNTER — Encounter: Payer: Self-pay | Admitting: Internal Medicine

## 2016-06-12 ENCOUNTER — Ambulatory Visit
Admission: RE | Admit: 2016-06-12 | Discharge: 2016-06-12 | Disposition: A | Discharge status: Home / Self Care | Payer: Medicare Other | Source: Ambulatory Visit | Attending: Internal Medicine | Admitting: Internal Medicine

## 2016-06-12 VITALS — BP 128/66 | HR 85 | Temp 98.4°F | Ht 62.0 in | Wt 221.2 lb

## 2016-06-12 DIAGNOSIS — Z794 Long term (current) use of insulin: Secondary | ICD-10-CM | POA: Diagnosis not present

## 2016-06-12 DIAGNOSIS — E1121 Type 2 diabetes mellitus with diabetic nephropathy: Secondary | ICD-10-CM

## 2016-06-12 DIAGNOSIS — N184 Chronic kidney disease, stage 4 (severe): Secondary | ICD-10-CM | POA: Diagnosis not present

## 2016-06-12 DIAGNOSIS — I5032 Chronic diastolic (congestive) heart failure: Secondary | ICD-10-CM | POA: Diagnosis not present

## 2016-06-12 DIAGNOSIS — M79672 Pain in left foot: Secondary | ICD-10-CM

## 2016-06-12 DIAGNOSIS — M7989 Other specified soft tissue disorders: Secondary | ICD-10-CM | POA: Diagnosis not present

## 2016-06-12 DIAGNOSIS — M170 Bilateral primary osteoarthritis of knee: Secondary | ICD-10-CM | POA: Diagnosis not present

## 2016-06-12 DIAGNOSIS — J441 Chronic obstructive pulmonary disease with (acute) exacerbation: Secondary | ICD-10-CM | POA: Diagnosis not present

## 2016-06-12 DIAGNOSIS — J449 Chronic obstructive pulmonary disease, unspecified: Secondary | ICD-10-CM | POA: Diagnosis not present

## 2016-06-12 DIAGNOSIS — I5033 Acute on chronic diastolic (congestive) heart failure: Secondary | ICD-10-CM | POA: Diagnosis not present

## 2016-06-12 DIAGNOSIS — I130 Hypertensive heart and chronic kidney disease with heart failure and stage 1 through stage 4 chronic kidney disease, or unspecified chronic kidney disease: Secondary | ICD-10-CM | POA: Diagnosis not present

## 2016-06-12 DIAGNOSIS — M25572 Pain in left ankle and joints of left foot: Secondary | ICD-10-CM | POA: Diagnosis not present

## 2016-06-12 DIAGNOSIS — E1122 Type 2 diabetes mellitus with diabetic chronic kidney disease: Secondary | ICD-10-CM | POA: Diagnosis not present

## 2016-06-12 IMAGING — CR DG CHEST 2V
2 series · 2 of 2 positions shown · non-contrast
Comparison: 12/28/2015 and 12/04/2015)

CLINICAL DATA: Bilateral leg swelling with increasing cough,
congestion and wheezing for several days. History of congestive
heart failure.

EXAM:
CHEST  2 VIEW

[x chest ap]
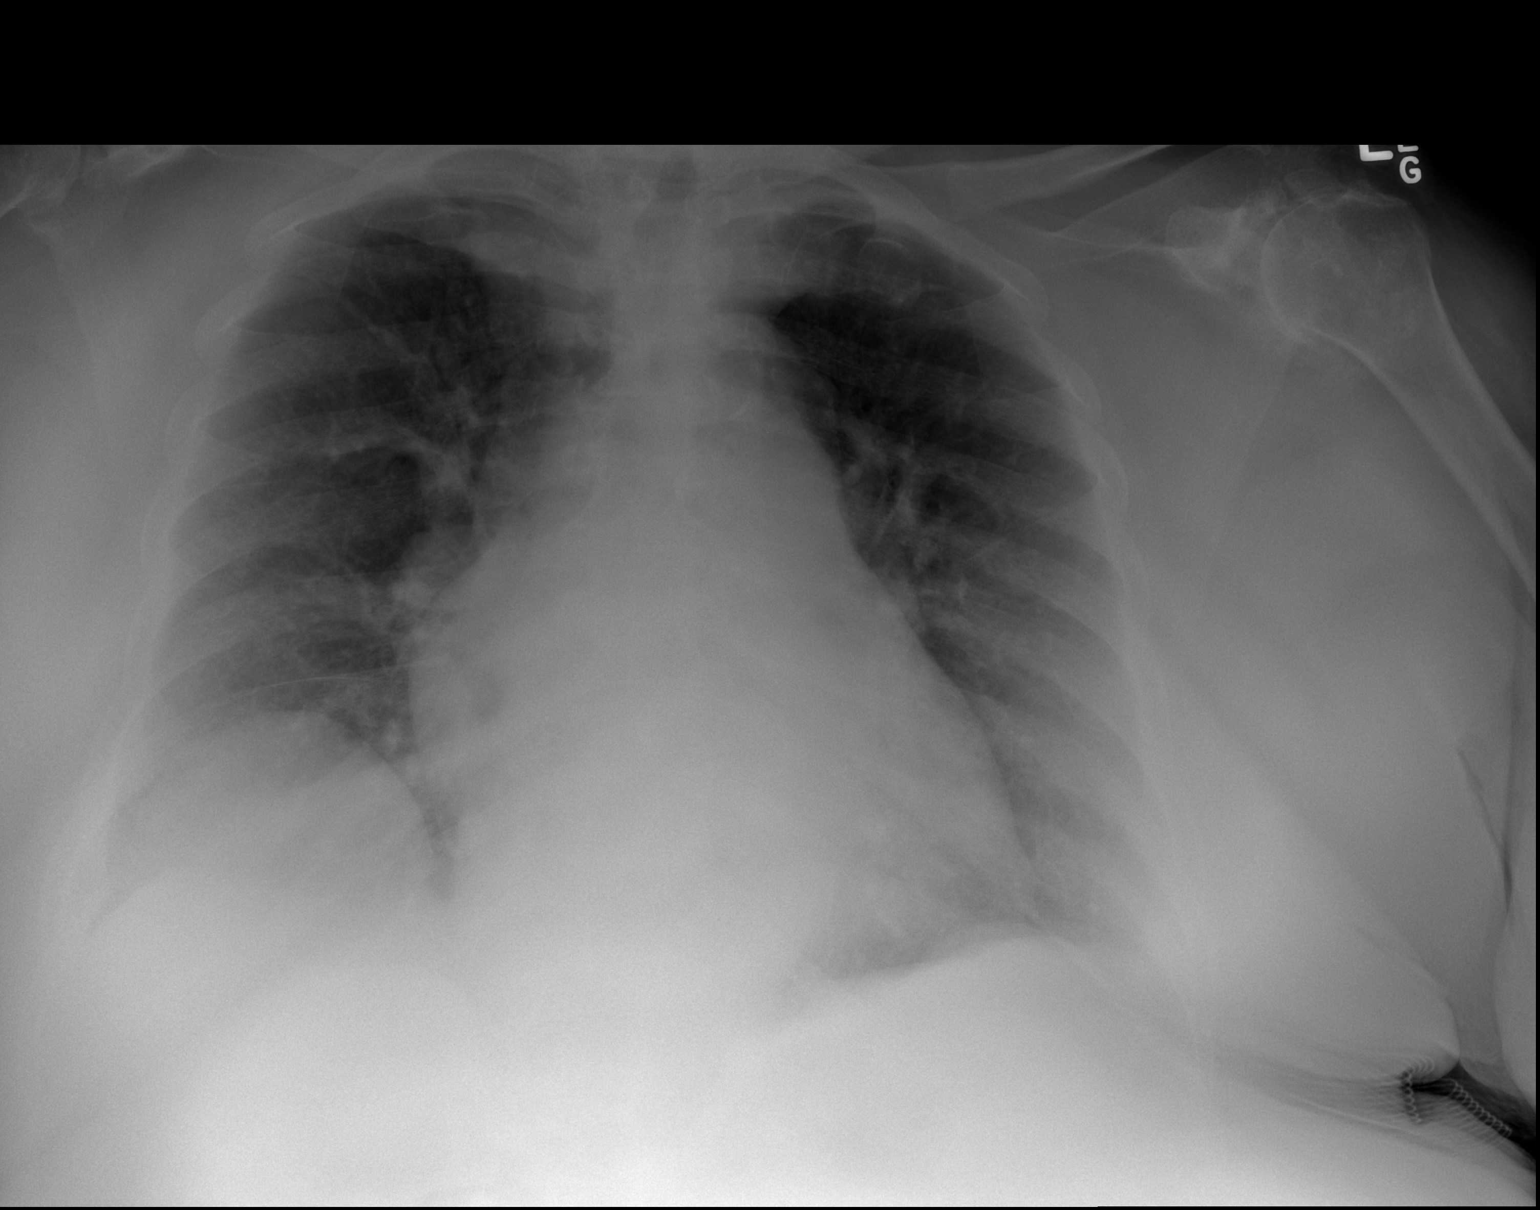

[w chest lat]
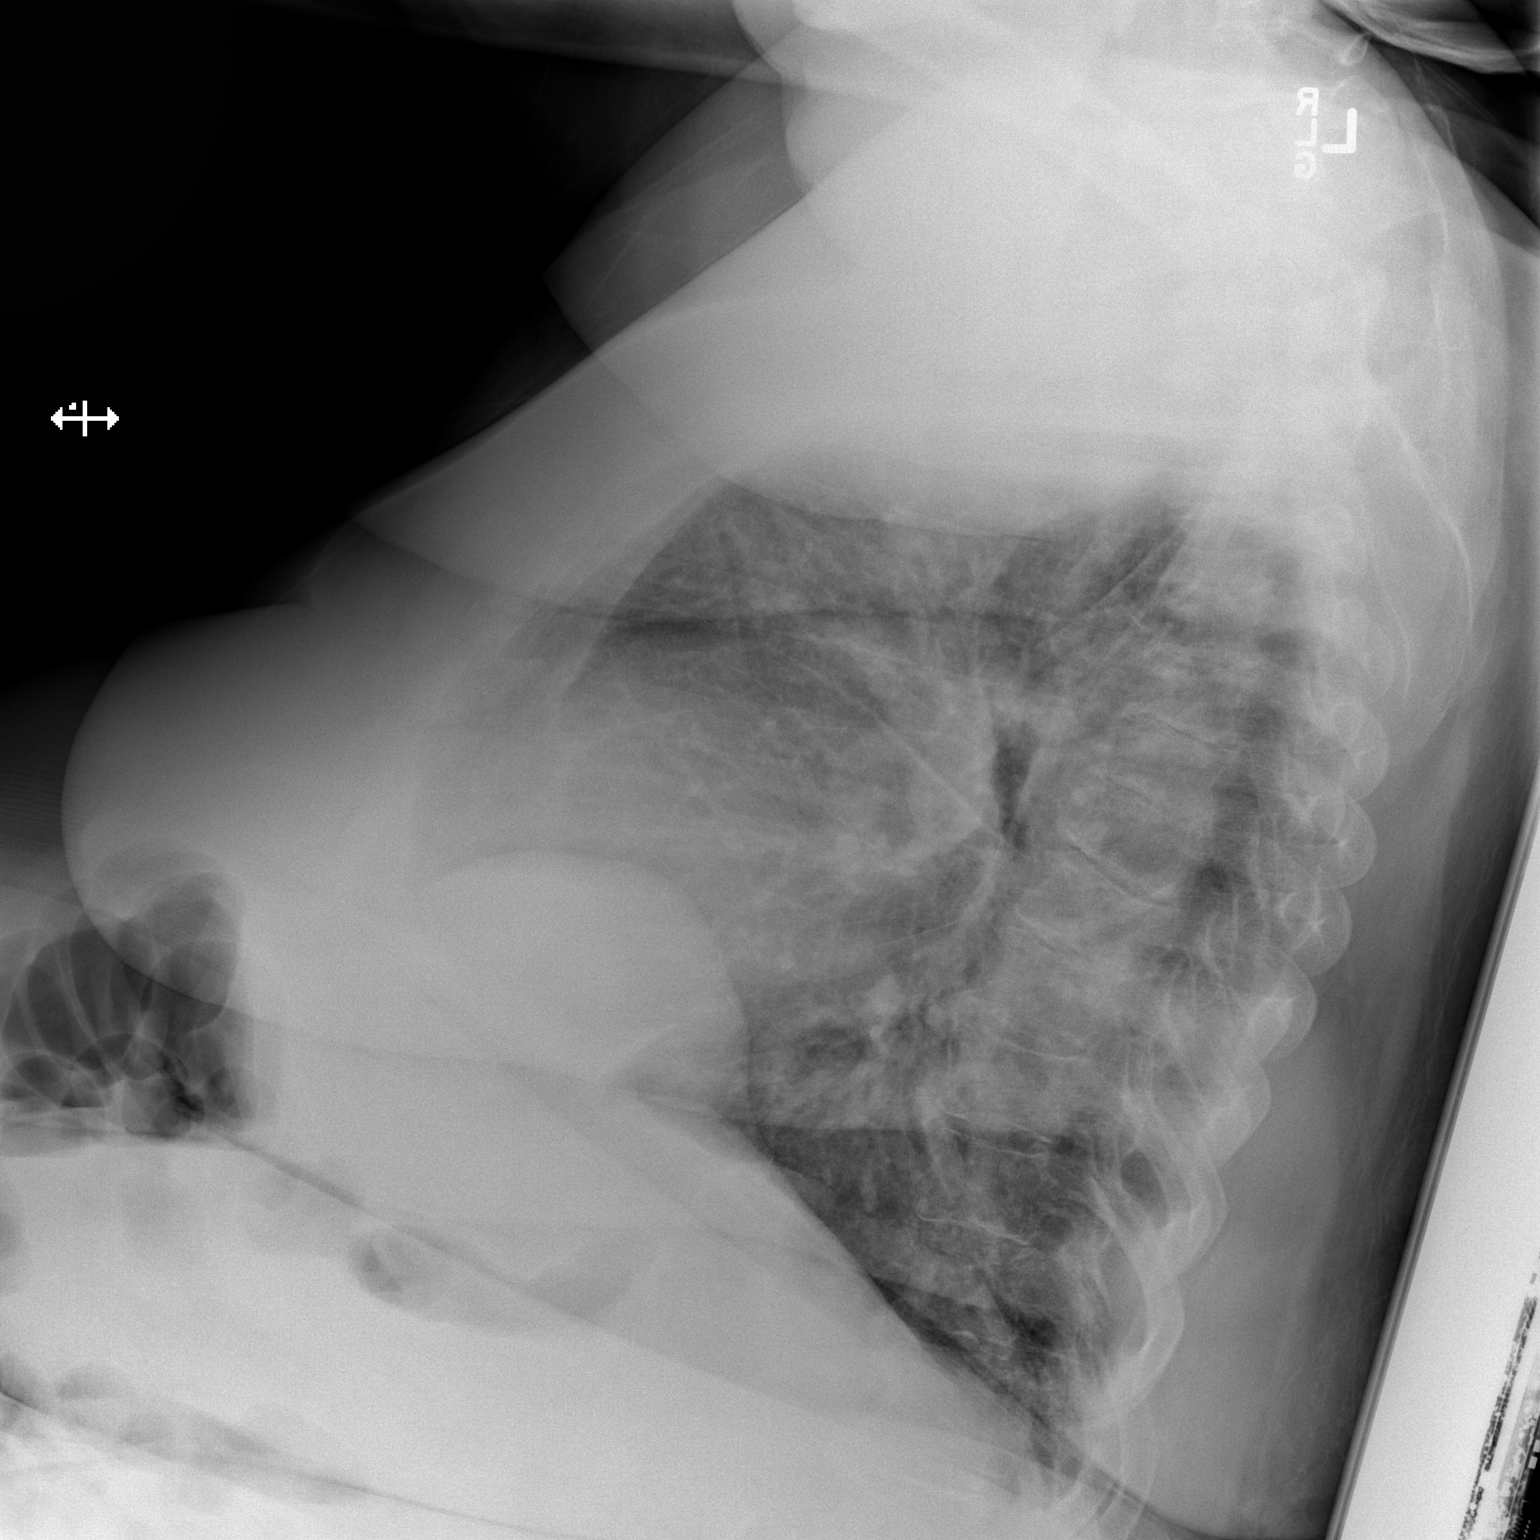

[2 of 2 positions shown; findings below may reference images not displayed]

FINDINGS: There is stable cardiomegaly and chronic vascular congestion. There
is right hemidiaphragm eventration with minimal adjacent basilar
atelectasis. No confluent airspace opacity, edema or significant
pleural effusion identified. Asymmetric glenohumeral degenerative
changes on the left appear chronic.
IMPRESSION: Stable cardiomegaly and chronic vascular congestion. No acute chest
findings.

## 2016-06-12 NOTE — Progress Notes (Signed)
Patient ID: Michelle Buck, female   DOB: 1935-12-30, 80 y.o.   MRN: [id number]    Location:  [facility] Place of Service: OFFICE  Chief Complaint  Patient presents with  . Acute Visit    Pulmonary Issues also coughing  . pain    Experiencing pain with left foot since yesterday. No known injury.    HPI:  80 yo female seen today for persistent cough. She was d/c'd from hospital last month with acute/chronic respiratory failure due to COPD exacerbation. She did not qualify for home O2 at d/c although she used O2 in hospital. [facility] RN performed O2 sats with readings 85%-->91%-->88% on RA at rest. Pulse also down to 50-->60 with ambulation-->55 at rest. She reports persistent cough worse at night. She has taken 3 bottles of robitussin without relief. She also takes pulmicort BID and prn duonebs (which she uses BID). She uses HFA TID and mucinex BID. She completed prednisone. She has never seen a pulmonologist  Multiple joint pain - she reports diffuse arthralgias but especially in legs b/l. Pain is shooting. She also c/o pain in left shoulder that began after fall in bathroom. She hit her shoulder against faucet and bruised it. She c/o severe pain in left medial foot and unable bare weight on it. She had similar pain in right foot that req'd surgical correction. She does not feel she needs orthotic and does not feel it will help. She takes norco, fentanyl patch and uses voltaren gel. Also takes cymbalta and gabapentin  HTN - BP stable on metoprolol, hydralazine, enalapril, clonidine  Hx CHF - stable on statin, diuretic. Takes asa daily  COPD - currently on duoneb, pulmicort, mucinex dm. Uses HFA TID.   DM with neuropathy - A1c 7% . Insulin dependent (40 units in AM and 25 at PM). Takes gabapentin. BS fluctuating. She has a low BS reaction at times. Followed by diabetic educator  Edema  - stable on demadex and lasix with Kdur  CKD/AOCD - stable  GERD/constipation - stable on nexium. She has emesis with  amitiza and miralax  Past Medical History  Diagnosis Date  . Diabetes mellitus   . Hypertension   . Obese   . Hiatal hernia   . Scoliosis   . Arthritis   . Acute bronchitis   . COPD (chronic obstructive pulmonary disease) ([place])     present for several years, diagnosed in the last few years  . Unspecified hereditary and idiopathic peripheral neuropathy   . Secondary diabetes mellitus with renal manifestations, not stated as uncontrolled, or unspecified   . Chronic kidney disease, stage II (mild)   . Disorder of bone and cartilage, unspecified   . Complications affecting other specified body systems, hypertension   . Hypopotassemia   . Anxiety   . Congestive heart failure, unspecified     stress test in March at [facility]  . GERD (gastroesophageal reflux disease)   . Lumbago   . Insomnia, unspecified   . Hyperlipidemia   . Unspecified glaucoma   . Diaphragmatic hernia without mention of obstruction or gangrene   . Osteoarthrosis, unspecified whether generalized or localized, unspecified site   . Degenerative arthritis of knee, bilateral   . PONV (postoperative nausea and vomiting)     Past Surgical History  Procedure Laterality Date  . Abdominal hysterectomy    . Shoulder surgery    . Total hip arthroplasty      bilateral  . Knee arthroscopy      bilateral  .  Colonoscopy  08/06/2011  . Foot surgery      Patient Care Team: [name], NP as PCP - General (Nurse Practitioner)  Social History   Social History  . Marital Status: Widowed    Spouse Name: N/A  . Number of Children: N/A  . Years of Education: N/A   Occupational History  . Retired    Social History Main Topics  . Smoking status: Former Smoker    Types: Cigarettes    Quit date: 02/27/1997  . Smokeless tobacco: Never Used     Comment: remote history - stopped 19 years ago; 20 pack year history  . Alcohol Use: No  . Drug Use: No  . Sexual Activity: No   Other Topics Concern  . Not  on file   Social History Narrative     reports that she quit smoking about 19 years ago. Her smoking use included Cigarettes. She has never used smokeless tobacco. She reports that she does not drink alcohol or use illicit drugs.  Family History  Problem Relation Age of Onset  . Diabetes Mother   . Heart disease Mother   . Heart disease Brother   . Diabetes Brother   . Kidney disease Father   . Scoliosis Sister   . Diabetes Brother   . Diabetes Brother   . Heart disease Brother   . Scoliosis Brother   . Stroke Brother   . Heart attack Brother    Family Status  Relation Status Death Age  . Mother Deceased   . Brother Deceased   . Father Deceased   . Sister Alive   . Brother Alive   . Brother Alive   . Brother Alive   . Brother Alive   . Son Deceased      Allergies  Allergen Reactions  . Tramadol Other (See Comments)    Leg cramps   . Codeine Nausea And Vomiting    Patient states N/V with codeine  . Hydrocodone Nausea And Vomiting  . Oxycodone Other (See Comments)    Patient states she can tolerate oxycodone  . Tylenol [Acetaminophen] Hives  . Penicillins Rash    Patient states rash/itch with penicillin Has patient had a PCN reaction causing immediate rash, facial/tongue/throat swelling, SOB or lightheadedness with hypotension: Yes- broke me out and i was itching Has patient had a PCN reaction causing severe rash involving mucus membranes or skin necrosis: No Has patient had a PCN reaction that required hospitalization Yes- i was already in the hospital Has patient had a PCN reaction occurring within the last 10 years: No If all of the above answers are "NO    Medications: Patient's Medications  New Prescriptions   No medications on file  Previous Medications   ALBUTEROL (PROVENTIL HFA;VENTOLIN HFA) 108 (90 BASE) MCG/ACT INHALER    Inhale 2 puffs into the lungs every 4 (four) hours as needed for wheezing or shortness of breath.   AMITIZA 24 MCG CAPSULE     TAKE 1 CAPSULE BY MOUTH 2 TIMES A DAY WITH MEALS   ASPIRIN EC 81 MG TABLET    Take 81 mg by mouth daily.   BUDESONIDE (PULMICORT) 0.25 MG/2ML NEBULIZER SOLUTION    Take one ampule in Neb twice daily routinely for COPD  J44.1   CHOLECALCIFEROL (VITAMIN D3) 2000 UNITS TABS    Take 2,000 Units by mouth daily.   CILOSTAZOL (PLETAL) 100 MG TABLET    Take 100 mg by mouth daily.   CLONIDINE (CATAPRES) 0.1  MG TABLET    TAKE 1 TABLET BY MOUTH EVERY DAY   CYCLOSPORINE (RESTASIS) 0.05 % OPHTHALMIC EMULSION    Place 1 drop into both eyes 2 (two) times daily. One drop once a day bilaterally for dry eyes   DEXTROMETHORPHAN-GUAIFENESIN (MUCINEX DM) 30-600 MG 12HR TABLET    Take 2 tablets by mouth 2 (two) times daily.   DICLOFENAC SODIUM (VOLTAREN) 1 % GEL    APPLY 4 GRAM TOPICALLY TWICE DAILY as needed for pain   DULOXETINE (CYMBALTA) 30 MG CAPSULE    TAKE 1 CAPSULE BY MOUTH EVERY DAY TO HELP ANXIETY AND PAINS   EASY TOUCH INSULIN SYRINGE 31G X 5/16" 0.5 ML MISC    USE UP TO 2 TIMES A DAY AS DIRECTED   ENALAPRIL (VASOTEC) 10 MG TABLET    TAKE 1 TABLET BY MOUTH ONCE DAILY FOR BLOOD PRESSURE AND STRENGTHEN HEART   GABAPENTIN (NEURONTIN) 100 MG CAPSULE    TAKE 2 CAPSULES BY MOUTH THREE TIMES A DAY   GUAIFENESIN (ROBITUSSIN) 100 MG/5ML SYRUP    Take 200 mg by mouth 3 (three) times daily as needed for cough.   HYDRALAZINE (APRESOLINE) 50 MG TABLET    TAKE 1 TABLET BY MOUTH TWICE DAILY   HYDROCODONE-ACETAMINOPHEN (NORCO) 10-325 MG TABLET    Take 1 tablet by mouth 3 (three) times daily.   INSULIN NPH-REGULAR HUMAN (NOVOLIN 70/30) (70-30) 100 UNIT/ML INJECTION    Inject 40 units subcutaneous with breakfast and then take 25 units with dinner to control blood sugar.   IPRATROPIUM-ALBUTEROL (DUONEB) 0.5-2.5 (3) MG/3ML SOLN    Take 18ms by nebulization every 6 hours as needed for SOB/Wheezing   METOPROLOL SUCCINATE (TOPROL-XL) 25 MG 24 HR TABLET    TAKE 1 TABLET BY MOUTH EVERY DAY WITH OR IMMEDIATELY FOLLOWING A MEAL FOR  BLOOD PRESSURE   NEXIUM 40 MG CAPSULE    TAKE 1 CAPSULE BY MOUTH ONCE DAILY   POLYETHYLENE GLYCOL (MIRALAX / GLYCOLAX) PACKET    DISSOLVE 17 GRAMS (1 PACKET) INTO 8 OUNCES OF LIQUID AND DRINK EVERY DAY   POTASSIUM CHLORIDE SA (K-DUR,KLOR-CON) 20 MEQ TABLET    TAKE 1 TABLET BY MOUTH EVERY DAY   PREDNISONE (DELTASONE) 20 MG TABLET    Take 2 tablets (40 mg total) by mouth daily with breakfast.   SERTRALINE (ZOLOFT) 50 MG TABLET    TAKE 1 TABLET BY MOUTH EVERY DAY.   SIMVASTATIN (ZOCOR) 20 MG TABLET    TAKE 1 TABLET BY MOUTH EVERY DAY   SUCRALFATE (CARAFATE) 1 G TABLET    TAKE 1 TABLET BY MOUTH TWICE DAILY ON EMPTY STOMACH.   TIMOLOL (TIMOPTIC) 0.5 % OPHTHALMIC SOLUTION    INSTILL 1 DROP INTO EACH EYE TWICE A DAY   TORSEMIDE (DEMADEX) 20 MG TABLET    Take 1 tablet by mouth daily for edema.   TRAVATAN Z 0.004 % SOLN OPHTHALMIC SOLUTION    Place 1 drop into both eyes at bedtime.   Modified Medications   No medications on file  Discontinued Medications   DOXYCYCLINE (VIBRA-TABS) 100 MG TABLET    Take 1 tablet (100 mg total) by mouth 2 (two) times daily.   HUMULIN 70/30 (70-30) 100 UNIT/ML INJECTION    INJECT 40 UNITS SUBCUTANEOUSLY WITH BREAKFAST AND 20 UNITS SUBCUTANEOUSLY WITH DINNER TO CONTROL BLOOD SUGAR.   LEVOFLOXACIN (LEVAQUIN) 500 MG TABLET    Take 500 mg by mouth daily. Reported on 06/12/2016    Review of Systems  Constitutional: Positive for activity change  and fatigue.  Eyes: Positive for visual disturbance.  Respiratory: Positive for cough and shortness of breath.   Musculoskeletal: Positive for joint swelling, arthralgias and gait problem.  Neurological: Positive for weakness and numbness.  All other systems reviewed and are negative.   Filed Vitals:   06/12/16 0954  BP: 128/66  Pulse: 85  Temp: 98.4 F (36.9 C)  TempSrc: Oral  Height: '5\' 2"'$  (1.575 m)  Weight: 221 lb 3.2 oz (100.336 kg)   Body mass index is 40.45 kg/(m^2).  Physical Exam  Constitutional: She is oriented  to person, place, and time. She appears well-developed and well-nourished.  HENT:  Mouth/Throat: Oropharynx is clear and moist. No oropharyngeal exudate.  Eyes: Pupils are equal, round, and reactive to light. No scleral icterus.  Neck: Neck supple. Carotid bruit is not present. No tracheal deviation present. No thyromegaly present.  Cardiovascular: Normal rate, regular rhythm and intact distal pulses.  Exam reveals no gallop and no friction rub.   Murmur (1/6 SEM) heard. +1 pitting LE edema b/l. no calf TTP.   Pulmonary/Chest: No stridor. No respiratory distress. She has decreased breath sounds (b/l at base). She has wheezes (end expiratory). She has no rales.  Poor inspiratory effort  Abdominal: Soft. Bowel sounds are normal. There is no hepatomegaly.  Musculoskeletal: She exhibits edema and tenderness.  Uses rolling walker; left ankle and foot swelling with reduced ROM and TTP; antalgic gait; increased warmth to touch  Lymphadenopathy:    She has no cervical adenopathy.  Neurological: She is alert and oriented to person, place, and time.  Skin: Skin is warm and dry. No rash noted.  Psychiatric: She has a normal mood and affect. Her behavior is normal. Thought content normal.     Labs reviewed: Office Visit on 06/08/2016  Component Date Value Ref Range Status  . Hgb A1c MFr Bld 06/08/2016 7.0* <5.7 % Final   Comment:   For someone without known diabetes, a hemoglobin A1c value of 6.5% or greater indicates that they may have diabetes and this should be confirmed with a follow-up test.   For someone with known diabetes, a value <7% indicates that their diabetes is well controlled and a value greater than or equal to 7% indicates suboptimal control. A1c targets should be individualized based on duration of diabetes, age, comorbid conditions, and other considerations.   Currently, no consensus exists for use of hemoglobin A1c for diagnosis of diabetes for children.     . Mean  Plasma Glucose 06/08/2016 154   Final  . Sodium 06/08/2016 143  135 - 146 mmol/L Final  . Potassium 06/08/2016 3.6  3.5 - 5.3 mmol/L Final  . Chloride 06/08/2016 104  98 - 110 mmol/L Final  . CO2 06/08/2016 28  20 - 31 mmol/L Final  . Glucose, Bld 06/08/2016 71  65 - 99 mg/dL Final  . BUN 06/08/2016 22  7 - 25 mg/dL Final  . Creat 06/08/2016 2.07* 0.60 - 0.93 mg/dL Final   Comment:   For patients > or = 78 years of age: The upper reference limit for Creatinine is approximately 13% higher for people identified as African-American.     . Total Bilirubin 06/08/2016 0.3  0.2 - 1.2 mg/dL Final  . Alkaline Phosphatase 06/08/2016 78  33 - 130 U/L Final  . AST 06/08/2016 14  10 - 35 U/L Final  . ALT 06/08/2016 7  6 - 29 U/L Final  . Total Protein 06/08/2016 6.2  6.1 - 8.1 g/dL Final  .  Albumin 06/08/2016 3.5* 3.6 - 5.1 g/dL Final  . Calcium 06/08/2016 8.3* 8.6 - 10.4 mg/dL Final  . GFR, Est African American 06/08/2016 26* >=60 mL/min Final  . GFR, Est Non African American 06/08/2016 22* >=60 mL/min Final  . WBC 06/08/2016 7.0  3.8 - 10.8 K/uL Final  . RBC 06/08/2016 3.89  3.80 - 5.10 MIL/uL Final  . Hemoglobin 06/08/2016 10.2* 11.7 - 15.5 g/dL Final  . HCT 06/08/2016 31.7* 35.0 - 45.0 % Final  . MCV 06/08/2016 81.5  80.0 - 100.0 fL Final  . MCH 06/08/2016 26.2* 27.0 - 33.0 pg Final  . MCHC 06/08/2016 32.2  32.0 - 36.0 g/dL Final  . RDW 06/08/2016 16.6* 11.0 - 15.0 % Final  . Platelets 06/08/2016 222  140 - 400 K/uL Final  . MPV 06/08/2016 10.0  7.5 - 12.5 fL Final  . Neutro Abs 06/08/2016 4620  1500 - 7800 cells/uL Final  . Lymphs Abs 06/08/2016 1610  850 - 3900 cells/uL Final  . Monocytes Absolute 06/08/2016 420  200 - 950 cells/uL Final  . Eosinophils Absolute 06/08/2016 350  15 - 500 cells/uL Final  . Basophils Absolute 06/08/2016 0  0 - 200 cells/uL Final  . Neutrophils Relative % 06/08/2016 66   Final  . Lymphocytes Relative 06/08/2016 23   Final  . Monocytes Relative 06/08/2016  6   Final  . Eosinophils Relative 06/08/2016 5   Final  . Basophils Relative 06/08/2016 0   Final  . Smear Review 06/08/2016 Criteria for review not met   Final   ** Please note change in unit of measure and reference range(s). **  Admission on 05/21/2016, Discharged on 05/25/2016  Component Date Value Ref Range Status  . WBC 05/21/2016 8.1  4.0 - 10.5 K/uL Final  . RBC 05/21/2016 4.15  3.87 - 5.11 MIL/uL Final  . Hemoglobin 05/21/2016 10.8* 12.0 - 15.0 g/dL Final  . HCT 05/21/2016 33.6* 36.0 - 46.0 % Final  . MCV 05/21/2016 81.0  78.0 - 100.0 fL Final  . MCH 05/21/2016 26.0  26.0 - 34.0 pg Final  . MCHC 05/21/2016 32.1  30.0 - 36.0 g/dL Final  . RDW 05/21/2016 16.7* 11.5 - 15.5 % Final  . Platelets 05/21/2016 255  150 - 400 K/uL Final  . Neutrophils Relative % 05/21/2016 56   Final  . Neutro Abs 05/21/2016 4.5  1.7 - 7.7 K/uL Final  . Lymphocytes Relative 05/21/2016 26   Final  . Lymphs Abs 05/21/2016 2.1  0.7 - 4.0 K/uL Final  . Monocytes Relative 05/21/2016 8   Final  . Monocytes Absolute 05/21/2016 0.7  0.1 - 1.0 K/uL Final  . Eosinophils Relative 05/21/2016 9   Final  . Eosinophils Absolute 05/21/2016 0.7  0.0 - 0.7 K/uL Final  . Basophils Relative 05/21/2016 1   Final  . Basophils Absolute 05/21/2016 0.0  0.0 - 0.1 K/uL Final  . Sodium 05/21/2016 144  135 - 145 mmol/L Final  . Potassium 05/21/2016 3.6  3.5 - 5.1 mmol/L Final  . Chloride 05/21/2016 105  101 - 111 mmol/L Final  . BUN 05/21/2016 27* 6 - 20 mg/dL Final  . Creatinine, Ser 05/21/2016 2.30* 0.44 - 1.00 mg/dL Final  . Glucose, Bld 05/21/2016 126* 65 - 99 mg/dL Final  . Calcium, Ion 05/21/2016 1.14  1.13 - 1.30 mmol/L Final  . TCO2 05/21/2016 27  0 - 100 mmol/L Final  . Hemoglobin 05/21/2016 12.2  12.0 - 15.0 g/dL Final  . HCT 05/21/2016  36.0  36.0 - 46.0 % Final  . B Natriuretic Peptide 05/21/2016 41.5  0.0 - 100.0 pg/mL Final  . Troponin i, poc 05/21/2016 0.02  0.00 - 0.08 ng/mL Final  . Comment 3 05/21/2016           Final   Comment: Due to the release kinetics of cTnI, a negative result within the first hours of the onset of symptoms does not rule out myocardial infarction with certainty. If myocardial infarction is still suspected, repeat the test at appropriate intervals.   . Sodium 05/22/2016 139  135 - 145 mmol/L Final  . Potassium 05/22/2016 4.0  3.5 - 5.1 mmol/L Final  . Chloride 05/22/2016 104  101 - 111 mmol/L Final  . CO2 05/22/2016 24  22 - 32 mmol/L Final  . Glucose, Bld 05/22/2016 260* 65 - 99 mg/dL Final  . BUN 05/22/2016 27* 6 - 20 mg/dL Final  . Creatinine, Ser 05/22/2016 2.32* 0.44 - 1.00 mg/dL Final  . Calcium 05/22/2016 8.5* 8.9 - 10.3 mg/dL Final  . GFR calc non Af Amer 05/22/2016 19* >60 mL/min Final  . GFR calc Af Amer 05/22/2016 22* >60 mL/min Final   Comment: (NOTE) The eGFR has been calculated using the CKD EPI equation. This calculation has not been validated in all clinical situations. eGFR's persistently <60 mL/min signify possible Chronic Kidney Disease.   . Anion gap 05/22/2016 11  5 - 15 Final  . WBC 05/22/2016 6.6  4.0 - 10.5 K/uL Final  . RBC 05/22/2016 4.04  3.87 - 5.11 MIL/uL Final  . Hemoglobin 05/22/2016 10.5* 12.0 - 15.0 g/dL Final  . HCT 05/22/2016 33.0* 36.0 - 46.0 % Final  . MCV 05/22/2016 81.7  78.0 - 100.0 fL Final  . MCH 05/22/2016 26.0  26.0 - 34.0 pg Final  . MCHC 05/22/2016 31.8  30.0 - 36.0 g/dL Final  . RDW 05/22/2016 16.5* 11.5 - 15.5 % Final  . Platelets 05/22/2016 266  150 - 400 K/uL Final  . Glucose-Capillary 05/22/2016 247* 65 - 99 mg/dL Final  . Comment 1 05/22/2016 Notify RN   Final  . Glucose-Capillary 05/22/2016 183* 65 - 99 mg/dL Final  . Comment 1 05/22/2016 Notify RN   Final  . Comment 2 05/22/2016 Document in Chart   Final  . Glucose-Capillary 05/22/2016 123* 65 - 99 mg/dL Final  . Comment 1 05/22/2016 Notify RN   Final  . Comment 2 05/22/2016 Document in Chart   Final  . WBC 05/23/2016 11.4* 4.0 - 10.5 K/uL Final  . RBC  05/23/2016 3.94  3.87 - 5.11 MIL/uL Final  . Hemoglobin 05/23/2016 10.0* 12.0 - 15.0 g/dL Final  . HCT 05/23/2016 31.0* 36.0 - 46.0 % Final  . MCV 05/23/2016 78.7  78.0 - 100.0 fL Final  . MCH 05/23/2016 25.4* 26.0 - 34.0 pg Final  . MCHC 05/23/2016 32.3  30.0 - 36.0 g/dL Final  . RDW 05/23/2016 16.1* 11.5 - 15.5 % Final  . Platelets 05/23/2016 254  150 - 400 K/uL Final  . Sodium 05/23/2016 138  135 - 145 mmol/L Final  . Potassium 05/23/2016 3.9  3.5 - 5.1 mmol/L Final  . Chloride 05/23/2016 105  101 - 111 mmol/L Final  . CO2 05/23/2016 26  22 - 32 mmol/L Final  . Glucose, Bld 05/23/2016 176* 65 - 99 mg/dL Final  . BUN 05/23/2016 30* 6 - 20 mg/dL Final  . Creatinine, Ser 05/23/2016 2.10* 0.44 - 1.00 mg/dL Final  . Calcium 05/23/2016 8.9  8.9 - 10.3 mg/dL Final  . GFR calc non Af Amer 05/23/2016 22* >60 mL/min Final  . GFR calc Af Amer 05/23/2016 25* >60 mL/min Final   Comment: (NOTE) The eGFR has been calculated using the CKD EPI equation. This calculation has not been validated in all clinical situations. eGFR's persistently <60 mL/min signify possible Chronic Kidney Disease.   . Anion gap 05/23/2016 7  5 - 15 Final  . Glucose-Capillary 05/22/2016 71  65 - 99 mg/dL Final  . Comment 1 05/22/2016 Notify RN   Final  . Glucose-Capillary 05/23/2016 191* 65 - 99 mg/dL Final  . Glucose-Capillary 05/23/2016 184* 65 - 99 mg/dL Final  . Glucose-Capillary 05/23/2016 107* 65 - 99 mg/dL Final  . WBC 05/24/2016 10.1  4.0 - 10.5 K/uL Final  . RBC 05/24/2016 3.84* 3.87 - 5.11 MIL/uL Final  . Hemoglobin 05/24/2016 10.0* 12.0 - 15.0 g/dL Final  . HCT 05/24/2016 31.3* 36.0 - 46.0 % Final  . MCV 05/24/2016 81.5  78.0 - 100.0 fL Final  . MCH 05/24/2016 26.0  26.0 - 34.0 pg Final  . MCHC 05/24/2016 31.9  30.0 - 36.0 g/dL Final  . RDW 05/24/2016 16.6* 11.5 - 15.5 % Final  . Platelets 05/24/2016 254  150 - 400 K/uL Final  . Sodium 05/24/2016 139  135 - 145 mmol/L Final  . Potassium 05/24/2016 3.6   3.5 - 5.1 mmol/L Final  . Chloride 05/24/2016 107  101 - 111 mmol/L Final  . CO2 05/24/2016 25  22 - 32 mmol/L Final  . Glucose, Bld 05/24/2016 140* 65 - 99 mg/dL Final  . BUN 05/24/2016 35* 6 - 20 mg/dL Final  . Creatinine, Ser 05/24/2016 2.28* 0.44 - 1.00 mg/dL Final  . Calcium 05/24/2016 8.7* 8.9 - 10.3 mg/dL Final  . GFR calc non Af Amer 05/24/2016 20* >60 mL/min Final  . GFR calc Af Amer 05/24/2016 23* >60 mL/min Final   Comment: (NOTE) The eGFR has been calculated using the CKD EPI equation. This calculation has not been validated in all clinical situations. eGFR's persistently <60 mL/min signify possible Chronic Kidney Disease.   . Anion gap 05/24/2016 7  5 - 15 Final  . Glucose-Capillary 05/23/2016 127* 65 - 99 mg/dL Final  . Glucose-Capillary 05/24/2016 131* 65 - 99 mg/dL Final  . Glucose-Capillary 05/24/2016 138* 65 - 99 mg/dL Final  . Glucose-Capillary 05/24/2016 74  65 - 99 mg/dL Final  . Glucose-Capillary 05/24/2016 93  65 - 99 mg/dL Final  . Comment 1 05/24/2016 Notify RN   Final  . Glucose-Capillary 05/25/2016 70  65 - 99 mg/dL Final  . Glucose-Capillary 05/25/2016 151* 65 - 99 mg/dL Final    Dg Chest 2 View  05/21/2016  CLINICAL DATA:  Productive cough, SOB, and chest pain x1 month. Hx of HTN, diabetes, CHF. Former smoker x19 years ago. EXAM: CHEST  2 VIEW COMPARISON:  Multiple prior studies including 01/15/2016 FINDINGS: Heart is upper normal in size. There is focal eventration of the right hemidiaphragm, stable in appearance. Hiatal hernia is present. Mild bronchitic changes are noted. There are no focal consolidations or pleural effusions. No pulmonary edema. Mid thoracic spondylosis is noted. IMPRESSION: No evidence for acute cardiopulmonary abnormality. Electronically Signed   By: Nolon Nations M.D.   On: 05/21/2016 13:08     Assessment/Plan   ICD-9-CM ICD-10-CM   1. Chronic obstructive pulmonary disease, unspecified COPD type ([place]) - uncontrolled 496 J44.9  Ambulatory referral to Pulmonology  2. Chronic diastolic congestive heart failure (  [place]) 428.32 I50.32    428.0    3. Left foot pain 729.5 M79.672 DG Foot Complete Left     DG Ankle Complete Left  4. Type 2 diabetes mellitus with diabetic nephropathy, with long-term current use of insulin ([facility]) 250.40 E11.21    583.81 Z79.4    V58.67     Ambulatory pulse ox on RA in the office with ambulation and at rest did not fall below 89%. HR remained above 60. She has never seen pulmonologist and will refer her for further eval. She does not qualify for home O2 at this time  Need to get left foot and ankle xray and will call with results. May need podiatry eval vs Ortho  Continue other medications as ordered  Follow up as scheduled for routine visit next month  Manvir Prabhu S. Perlie Gold  [facility] [address] [place], [place] [address] [phone] Cell (Monday-Friday 8 AM - 5 PM) [phone] After 5 PM and follow prompts

## 2016-06-12 NOTE — Patient Instructions (Signed)
Need to get left foot and ankle xray and will call with results  Continue other medications as ordered  Will call with pulmonary referral   Follow up as scheduled for routine visit next month

## 2016-06-15 ENCOUNTER — Other Ambulatory Visit: Payer: Self-pay

## 2016-06-15 DIAGNOSIS — M79672 Pain in left foot: Secondary | ICD-10-CM

## 2016-06-17 DIAGNOSIS — N184 Chronic kidney disease, stage 4 (severe): Secondary | ICD-10-CM | POA: Diagnosis not present

## 2016-06-17 DIAGNOSIS — I5033 Acute on chronic diastolic (congestive) heart failure: Secondary | ICD-10-CM | POA: Diagnosis not present

## 2016-06-17 DIAGNOSIS — J441 Chronic obstructive pulmonary disease with (acute) exacerbation: Secondary | ICD-10-CM | POA: Diagnosis not present

## 2016-06-17 DIAGNOSIS — I130 Hypertensive heart and chronic kidney disease with heart failure and stage 1 through stage 4 chronic kidney disease, or unspecified chronic kidney disease: Secondary | ICD-10-CM | POA: Diagnosis not present

## 2016-06-17 DIAGNOSIS — M170 Bilateral primary osteoarthritis of knee: Secondary | ICD-10-CM | POA: Diagnosis not present

## 2016-06-17 DIAGNOSIS — E1122 Type 2 diabetes mellitus with diabetic chronic kidney disease: Secondary | ICD-10-CM | POA: Diagnosis not present

## 2016-06-22 DIAGNOSIS — N184 Chronic kidney disease, stage 4 (severe): Secondary | ICD-10-CM | POA: Diagnosis not present

## 2016-06-22 DIAGNOSIS — E1122 Type 2 diabetes mellitus with diabetic chronic kidney disease: Secondary | ICD-10-CM | POA: Diagnosis not present

## 2016-06-22 DIAGNOSIS — I5033 Acute on chronic diastolic (congestive) heart failure: Secondary | ICD-10-CM | POA: Diagnosis not present

## 2016-06-22 DIAGNOSIS — I130 Hypertensive heart and chronic kidney disease with heart failure and stage 1 through stage 4 chronic kidney disease, or unspecified chronic kidney disease: Secondary | ICD-10-CM | POA: Diagnosis not present

## 2016-06-22 DIAGNOSIS — J441 Chronic obstructive pulmonary disease with (acute) exacerbation: Secondary | ICD-10-CM | POA: Diagnosis not present

## 2016-06-22 DIAGNOSIS — M170 Bilateral primary osteoarthritis of knee: Secondary | ICD-10-CM | POA: Diagnosis not present

## 2016-06-23 ENCOUNTER — Ambulatory Visit (INDEPENDENT_AMBULATORY_CARE_PROVIDER_SITE_OTHER): Payer: Medicare Other | Admitting: Internal Medicine

## 2016-06-23 ENCOUNTER — Encounter: Payer: Self-pay | Admitting: Internal Medicine

## 2016-06-23 VITALS — BP 126/64 | HR 62 | Ht 62.0 in | Wt 202.0 lb

## 2016-06-23 DIAGNOSIS — J449 Chronic obstructive pulmonary disease, unspecified: Secondary | ICD-10-CM | POA: Diagnosis not present

## 2016-06-23 DIAGNOSIS — R5383 Other fatigue: Secondary | ICD-10-CM | POA: Insufficient documentation

## 2016-06-23 NOTE — Patient Instructions (Signed)
ICD-9-CM ICD-10-CM   1. Chronic obstructive pulmonary disease, unspecified COPD type ([place]) 496 J44.9   2. Other fatigue 780.79 R53.83    Continue your nebulizers Test ono on room air - if abnormal we can try oxygen for you at home - will call with results Flu shot in fall  Followup - do spirometry with June Leap pre and post in 3 months  - return to see Dr Chase Caller in 3 months

## 2016-06-23 NOTE — Progress Notes (Signed)
Subjective:     Patient ID: Michelle Buck, female   DOB: 1936/07/07, 80 y.o.   MRN: [id number]  PCP [name], NP  PCP .pvp  HPI  IOV 06/23/2016  Chief Complaint  Patient presents with  . Pulmonary Consult    referred by Dr Gildardo Cranker to evaluate for COPD - c/o increased SOB, prod cough with small amounts of yellow mucus x1 month.     80 year old obese female physically deconditioned from multiple spinal and joint issues. She has been referred for COPD. She carries a diagnosis of COPD. Her smoking history has been elicited again. She started smoking at age 83 and quit smoking at age 67 having smoked half pack per day. There for 15 pack smoking history. She reports insidious onset of shortness of breath and cough for the last 1 year. She is an extremely poor historian and questions her to be asked in multiple different ways. Symptoms are moderate in intensity to severe. They are progressive. She rates her shortness of breath on a scale of 4 out of 5. Rates her cough as 3 out of 5. Up until a month ago the sputum was mild and was clear but in the last 1 month after her discharge from her COPD exacerbation in June 2017 sputum has a yellow tint to it. She rates her fatigue is 6 out of 5. She is so fatigued that she does not want him walking desaturation test. In addition she feels she cannot do pulmonary rehabilitation because of her joint issues. She had COPD exacerbation diagnosis 05/21/2016 discharge date. She was hospitalized for 2 days. At that time she was considered hypoxemic at admission but discharged without oxygen. At this point she does not want herself tested for oxygen with exertion.   She takes care of her 78 year old who lives with her. This person who works for her. In addition she gets a visit with her. She's been using a walker for the last 2 years because of her joint issues.   CT angiogram 02/29/2012: Personally visualized. Has scoliosis. Has large hiatal hernia. Has  right-sided atelectasis  Chest x-ray 05/21/2016: Personally visualized. She has menstruation of right side of the diaphragm. She has hiatal hernia.  Nuclear medicine cardiac stress test 12/20/2015: Normal with ejection fraction 65%  HIV test 12/05/2015: Nonreactive  Echocardiogram 01/01/2016: Ejection fraction 65%. Diastolic dysfunction grade 1. Pulmonary artery systolic pressure 13  Blood work 06/08/2016: Creatinine 2.07 mg percent with a reduced GFR of 20. Hemoglobin 10.0 g percent and stable.   Pulmonary function test: Noticed that it's never been done.   has a past medical history of Acute bronchitis; Anxiety; Arthritis; Chronic kidney disease, stage II (mild); Complications affecting other specified body systems, hypertension; Congestive heart failure, unspecified; COPD (chronic obstructive pulmonary disease) ([place]); Degenerative arthritis of knee, bilateral; Diabetes mellitus; Diaphragmatic hernia without mention of obstruction or gangrene; Disorder of bone and cartilage, unspecified; GERD (gastroesophageal reflux disease); Hiatal hernia; Hyperlipidemia; Hypertension; Hypopotassemia; Insomnia, unspecified; Lumbago; Obese; Osteoarthrosis, unspecified whether generalized or localized, unspecified site; PONV (postoperative nausea and vomiting); Scoliosis; Secondary diabetes mellitus with renal manifestations, not stated as uncontrolled, or unspecified; Unspecified glaucoma; and Unspecified hereditary and idiopathic peripheral neuropathy.   reports that she quit smoking about 19 years ago. Her smoking use included Cigarettes. She has a 1.50 pack-year smoking history. She has never used smokeless tobacco.  Past Surgical History:  Procedure Laterality Date  . ABDOMINAL HYSTERECTOMY    . COLONOSCOPY  08/06/2011  . FOOT SURGERY    .  KNEE ARTHROSCOPY     bilateral  . SHOULDER SURGERY    . TOTAL HIP ARTHROPLASTY     bilateral    Allergies  Allergen Reactions  . Tramadol Other (See Comments)     Leg cramps   . Codeine Nausea And Vomiting    Patient states N/V with codeine  . Hydrocodone Nausea And Vomiting  . Oxycodone Other (See Comments)    Patient states she can tolerate oxycodone  . Tylenol [Acetaminophen] Hives  . Penicillins Rash    Patient states rash/itch with penicillin Has patient had a PCN reaction causing immediate rash, facial/tongue/throat swelling, SOB or lightheadedness with hypotension: Yes- broke me out and i was itching Has patient had a PCN reaction causing severe rash involving mucus membranes or skin necrosis: No Has patient had a PCN reaction that required hospitalization Yes- i was already in the hospital Has patient had a PCN reaction occurring within the last 10 years: No If all of the above answers are "NO    Immunization History  Administered Date(s) Administered  . Influenza Split 09/20/2012  . Influenza,inj,Quad PF,36+ Mos 10/24/2013, 10/10/2014, 08/20/2015  . Pneumococcal Conjugate-13 02/19/2015  . Pneumococcal Polysaccharide-23 12/01/2011    Family History  Problem Relation Age of Onset  . Diabetes Mother   . Heart disease Mother   . Heart disease Brother   . Diabetes Brother   . Kidney disease Father   . Scoliosis Sister   . Diabetes Brother   . Diabetes Brother   . Heart disease Brother   . Scoliosis Brother   . Stroke Brother   . Heart attack Brother      Current Outpatient Prescriptions:  .  albuterol (PROVENTIL HFA;VENTOLIN HFA) 108 (90 Base) MCG/ACT inhaler, Inhale 2 puffs into the lungs every 4 (four) hours as needed for wheezing or shortness of breath., Disp: , Rfl:  .  AMITIZA 24 MCG capsule, TAKE 1 CAPSULE BY MOUTH 2 TIMES A DAY WITH MEALS, Disp: 60 capsule, Rfl: 4 .  aspirin EC 81 MG tablet, Take 81 mg by mouth daily., Disp: , Rfl:  .  budesonide (PULMICORT) 0.25 MG/2ML nebulizer solution, Take one ampule in Neb twice daily routinely for COPD  J44.1, Disp: 120 mL, Rfl: 6 .  Cholecalciferol (VITAMIN D3) 2000 units  TABS, Take 2,000 Units by mouth daily., Disp: , Rfl:  .  cilostazol (PLETAL) 100 MG tablet, Take 100 mg by mouth daily., Disp: , Rfl:  .  cloNIDine (CATAPRES) 0.1 MG tablet, TAKE 1 TABLET BY MOUTH EVERY DAY, Disp: 30 tablet, Rfl: 3 .  cycloSPORINE (RESTASIS) 0.05 % ophthalmic emulsion, Place 1 drop into both eyes 2 (two) times daily. One drop once a day bilaterally for dry eyes, Disp: , Rfl:  .  dextromethorphan-guaiFENesin (MUCINEX DM) 30-600 MG 12hr tablet, Take 2 tablets by mouth 2 (two) times daily., Disp: 14 tablet, Rfl: 0 .  diclofenac sodium (VOLTAREN) 1 % GEL, APPLY 4 GRAM TOPICALLY TWICE DAILY as needed for pain, Disp: 200 g, Rfl: 6 .  DULoxetine (CYMBALTA) 30 MG capsule, TAKE 1 CAPSULE BY MOUTH EVERY DAY TO HELP ANXIETY AND PAINS, Disp: 30 capsule, Rfl: 5 .  EASY TOUCH INSULIN SYRINGE 31G X 5/16" 0.5 ML MISC, USE UP TO 2 TIMES A DAY AS DIRECTED, Disp: 100 each, Rfl: 1 .  enalapril (VASOTEC) 10 MG tablet, TAKE 1 TABLET BY MOUTH ONCE DAILY FOR BLOOD PRESSURE AND STRENGTHEN HEART, Disp: 30 tablet, Rfl: 5 .  gabapentin (NEURONTIN) 100 MG capsule, TAKE  2 CAPSULES BY MOUTH THREE TIMES A DAY, Disp: 180 capsule, Rfl: 4 .  guaifenesin (ROBITUSSIN) 100 MG/5ML syrup, Take 200 mg by mouth 3 (three) times daily as needed for cough., Disp: , Rfl:  .  hydrALAZINE (APRESOLINE) 50 MG tablet, TAKE 1 TABLET BY MOUTH TWICE DAILY, Disp: 60 tablet, Rfl: 4 .  HYDROcodone-acetaminophen (NORCO) 10-325 MG tablet, Take 1 tablet by mouth 3 (three) times daily., Disp: 90 tablet, Rfl: 0 .  insulin NPH-regular Human (NOVOLIN 70/30) (70-30) 100 UNIT/ML injection, Inject 40 units subcutaneous with breakfast and then take 25 units with dinner to control blood sugar., Disp: , Rfl:  .  ipratropium-albuterol (DUONEB) 0.5-2.5 (3) MG/3ML SOLN, Take 57mls by nebulization every 6 hours as needed for SOB/Wheezing, Disp: 360 mL, Rfl: 3 .  metoprolol succinate (TOPROL-XL) 25 MG 24 hr tablet, TAKE 1 TABLET BY MOUTH EVERY DAY WITH OR  IMMEDIATELY FOLLOWING A MEAL FOR BLOOD PRESSURE, Disp: 30 tablet, Rfl: 5 .  NEXIUM 40 MG capsule, TAKE 1 CAPSULE BY MOUTH ONCE DAILY, Disp: 30 capsule, Rfl: 5 .  polyethylene glycol (MIRALAX / GLYCOLAX) packet, DISSOLVE 17 GRAMS (1 PACKET) INTO 8 OUNCES OF LIQUID AND DRINK EVERY DAY, Disp: 30 each, Rfl: 1 .  potassium chloride SA (K-DUR,KLOR-CON) 20 MEQ tablet, TAKE 1 TABLET BY MOUTH EVERY DAY, Disp: 30 tablet, Rfl: 4 .  sertraline (ZOLOFT) 50 MG tablet, TAKE 1 TABLET BY MOUTH EVERY DAY., Disp: 30 tablet, Rfl: 0 .  simvastatin (ZOCOR) 20 MG tablet, TAKE 1 TABLET BY MOUTH EVERY DAY, Disp: 30 tablet, Rfl: 5 .  sucralfate (CARAFATE) 1 g tablet, TAKE 1 TABLET BY MOUTH TWICE DAILY ON EMPTY STOMACH., Disp: 60 tablet, Rfl: 3 .  timolol (TIMOPTIC) 0.5 % ophthalmic solution, INSTILL 1 DROP INTO EACH EYE TWICE A DAY, Disp: 5 mL, Rfl: 4 .  torsemide (DEMADEX) 20 MG tablet, Take 1 tablet by mouth daily for edema., Disp: 90 tablet, Rfl: 2 .  TRAVATAN Z 0.004 % SOLN ophthalmic solution, Place 1 drop into both eyes at bedtime. , Disp: , Rfl:     Review of Systems  Constitutional: Negative for fever and unexpected weight change.  HENT: Negative for congestion, dental problem, ear pain, nosebleeds, postnasal drip, rhinorrhea, sinus pressure, sneezing, sore throat and trouble swallowing.   Eyes: Negative for redness and itching.  Respiratory: Positive for cough and shortness of breath. Negative for chest tightness and wheezing.   Cardiovascular: Negative for palpitations and leg swelling.  Gastrointestinal: Negative for nausea and vomiting.  Genitourinary: Negative for dysuria.  Musculoskeletal: Negative for joint swelling.  Skin: Negative for rash.  Neurological: Negative for headaches.  Hematological: Does not bruise/bleed easily.  Psychiatric/Behavioral: Negative for dysphoric mood. The patient is not nervous/anxious.        Objective:   Physical Exam  Constitutional: She is oriented to person,  place, and time. She appears well-nourished. No distress.  Body mass index is 36.95 kg/m.   HENT:  Head: Normocephalic and atraumatic.  Right Ear: External ear normal.  Left Ear: External ear normal.  Mouth/Throat: Oropharynx is clear and moist. No oropharyngeal exudate.  Eyes: Conjunctivae and EOM are normal. Pupils are equal, round, and reactive to light. Right eye exhibits no discharge. Left eye exhibits no discharge. No scleral icterus.  Neck: Normal range of motion. Neck supple. No JVD present. No tracheal deviation present. No thyromegaly present.  Cardiovascular: Normal rate, regular rhythm, normal heart sounds and intact distal pulses.  Exam reveals no gallop and no friction  rub.   No murmur heard. Pulmonary/Chest: Effort normal and breath sounds normal. No respiratory distress. She has no wheezes. She has no rales. She exhibits no tenderness.  Abdominal: Soft. Bowel sounds are normal. She exhibits no distension and no mass. There is no tenderness. There is no rebound and no guarding.  Obese abdomen  Musculoskeletal: Normal range of motion. She exhibits edema. She exhibits no tenderness.  Walks with a walker. Looks physically deconditioned.  Lymphadenopathy:    She has no cervical adenopathy.  Neurological: She is alert and oriented to person, place, and time. She has normal reflexes. No cranial nerve deficit. She exhibits normal muscle tone. Coordination normal.  Skin: Skin is warm and dry. No rash noted. She is not diaphoretic. No erythema. No pallor.  Psychiatric: Judgment normal.  Poor historian  Vitals reviewed.  Vitals:   06/23/16 1506  BP: 126/64  Pulse: 62  SpO2: 94%  Weight: 202 lb (91.6 kg)  Height: 5\' 2"  (1.575 m)       Assessment:       ICD-9-CM ICD-10-CM   1. Chronic obstructive pulmonary disease, unspecified COPD type ([place]) 496 J44.9 Pulse oximetry, overnight     Pulmonary function test  2. Other fatigue 780.79 R53.83 Pulse oximetry, overnight      Pulmonary function test       Plan:     She is at risk for COPD. But this is never been proven based on Pulmicort function testing. prior CT scan of the chest does not show emphysema. Currently she is on treatment as though she is a COPD patient. She has a lot of fatigue associated with her other medical problems. At this point in time pulmonary rehabilitation will significantly benefit her but she is unable to do because of her joint pains and lack of motivation. Therefore we'll test her for overnight oxygen desaturation and if this is positive can try nocturnal oxygen to see if it'll help her daytime symptoms although the evidence on this is plus minus. She is agreeable with this plan. She does not want walking desaturation test tested.  Also do spirometry in 3 months when she comes back for follow-up. Till then she'll continue her nebulizers   Dr. Brand Males, M.D., [facility].C.P Pulmonary and Critical Care Medicine Staff Physician [facility] Pager: [phone], If no answer or between  15:00h - 7:00h: call [phone]  06/23/2016 3:38 PM

## 2016-06-24 DIAGNOSIS — N184 Chronic kidney disease, stage 4 (severe): Secondary | ICD-10-CM | POA: Diagnosis not present

## 2016-06-24 DIAGNOSIS — M170 Bilateral primary osteoarthritis of knee: Secondary | ICD-10-CM | POA: Diagnosis not present

## 2016-06-24 DIAGNOSIS — I130 Hypertensive heart and chronic kidney disease with heart failure and stage 1 through stage 4 chronic kidney disease, or unspecified chronic kidney disease: Secondary | ICD-10-CM | POA: Diagnosis not present

## 2016-06-24 DIAGNOSIS — I5033 Acute on chronic diastolic (congestive) heart failure: Secondary | ICD-10-CM | POA: Diagnosis not present

## 2016-06-24 DIAGNOSIS — E1122 Type 2 diabetes mellitus with diabetic chronic kidney disease: Secondary | ICD-10-CM | POA: Diagnosis not present

## 2016-06-24 DIAGNOSIS — J441 Chronic obstructive pulmonary disease with (acute) exacerbation: Secondary | ICD-10-CM | POA: Diagnosis not present

## 2016-06-26 DIAGNOSIS — M170 Bilateral primary osteoarthritis of knee: Secondary | ICD-10-CM | POA: Diagnosis not present

## 2016-06-26 DIAGNOSIS — N184 Chronic kidney disease, stage 4 (severe): Secondary | ICD-10-CM | POA: Diagnosis not present

## 2016-06-26 DIAGNOSIS — I5033 Acute on chronic diastolic (congestive) heart failure: Secondary | ICD-10-CM | POA: Diagnosis not present

## 2016-06-26 DIAGNOSIS — E1122 Type 2 diabetes mellitus with diabetic chronic kidney disease: Secondary | ICD-10-CM | POA: Diagnosis not present

## 2016-06-26 DIAGNOSIS — J441 Chronic obstructive pulmonary disease with (acute) exacerbation: Secondary | ICD-10-CM | POA: Diagnosis not present

## 2016-06-26 DIAGNOSIS — I130 Hypertensive heart and chronic kidney disease with heart failure and stage 1 through stage 4 chronic kidney disease, or unspecified chronic kidney disease: Secondary | ICD-10-CM | POA: Diagnosis not present

## 2016-06-29 DIAGNOSIS — M170 Bilateral primary osteoarthritis of knee: Secondary | ICD-10-CM | POA: Diagnosis not present

## 2016-06-29 DIAGNOSIS — I5033 Acute on chronic diastolic (congestive) heart failure: Secondary | ICD-10-CM | POA: Diagnosis not present

## 2016-06-29 DIAGNOSIS — I130 Hypertensive heart and chronic kidney disease with heart failure and stage 1 through stage 4 chronic kidney disease, or unspecified chronic kidney disease: Secondary | ICD-10-CM | POA: Diagnosis not present

## 2016-06-29 DIAGNOSIS — E1122 Type 2 diabetes mellitus with diabetic chronic kidney disease: Secondary | ICD-10-CM | POA: Diagnosis not present

## 2016-06-29 DIAGNOSIS — N184 Chronic kidney disease, stage 4 (severe): Secondary | ICD-10-CM | POA: Diagnosis not present

## 2016-06-29 DIAGNOSIS — J441 Chronic obstructive pulmonary disease with (acute) exacerbation: Secondary | ICD-10-CM | POA: Diagnosis not present

## 2016-07-06 ENCOUNTER — Ambulatory Visit: Payer: Medicare Other | Admitting: Podiatry

## 2016-07-07 ENCOUNTER — Other Ambulatory Visit: Payer: Self-pay | Admitting: Nurse Practitioner

## 2016-07-10 ENCOUNTER — Ambulatory Visit (INDEPENDENT_AMBULATORY_CARE_PROVIDER_SITE_OTHER): Payer: Medicare Other | Admitting: Internal Medicine

## 2016-07-10 ENCOUNTER — Encounter: Payer: Self-pay | Admitting: Internal Medicine

## 2016-07-10 VITALS — BP 128/70 | HR 60 | Temp 97.8°F | Ht 62.0 in | Wt 216.6 lb

## 2016-07-10 DIAGNOSIS — M79672 Pain in left foot: Secondary | ICD-10-CM | POA: Diagnosis not present

## 2016-07-10 DIAGNOSIS — I5032 Chronic diastolic (congestive) heart failure: Secondary | ICD-10-CM

## 2016-07-10 DIAGNOSIS — R600 Localized edema: Secondary | ICD-10-CM

## 2016-07-10 DIAGNOSIS — I10 Essential (primary) hypertension: Secondary | ICD-10-CM | POA: Diagnosis not present

## 2016-07-10 DIAGNOSIS — L723 Sebaceous cyst: Secondary | ICD-10-CM | POA: Diagnosis not present

## 2016-07-10 DIAGNOSIS — J449 Chronic obstructive pulmonary disease, unspecified: Secondary | ICD-10-CM | POA: Diagnosis not present

## 2016-07-10 DIAGNOSIS — G8929 Other chronic pain: Secondary | ICD-10-CM

## 2016-07-10 DIAGNOSIS — A499 Bacterial infection, unspecified: Secondary | ICD-10-CM

## 2016-07-10 DIAGNOSIS — E1121 Type 2 diabetes mellitus with diabetic nephropathy: Secondary | ICD-10-CM

## 2016-07-10 DIAGNOSIS — B9689 Other specified bacterial agents as the cause of diseases classified elsewhere: Secondary | ICD-10-CM

## 2016-07-10 DIAGNOSIS — M545 Low back pain, unspecified: Secondary | ICD-10-CM

## 2016-07-10 DIAGNOSIS — Z794 Long term (current) use of insulin: Secondary | ICD-10-CM | POA: Diagnosis not present

## 2016-07-10 DIAGNOSIS — N760 Acute vaginitis: Secondary | ICD-10-CM | POA: Diagnosis not present

## 2016-07-10 MED ORDER — HYDROCODONE-ACETAMINOPHEN 10-325 MG PO TABS: 1.0000 | ORAL_TABLET | Freq: Three times a day (TID) | ORAL | 0 refills | Status: DC

## 2016-07-10 MED ORDER — METRONIDAZOLE 500 MG PO TABS: 500.0000 mg | ORAL_TABLET | Freq: Two times a day (BID) | ORAL | 0 refills | Status: AC

## 2016-07-10 NOTE — Patient Instructions (Signed)
Take metronidazole 500mg  2 times daily x 7 days for vaginal infection  Continue other medications as ordered  Follow up with specialists as scheduled  Follow up with Tivis Ringer as scheduled  Follow up in 2 mos for routine visit

## 2016-07-10 NOTE — Progress Notes (Signed)
Patient ID: Michelle Buck, female   DOB: 1936/07/28, 80 y.o.   MRN: [id number]    Location:  [facility] Place of Service: OFFICE  Chief Complaint  Patient presents with  . Medical Management of Chronic Issues    2 months follow up    HPI:  80 yo female seen today for f/u. She did not get to the podiatrist yet due to transportation issues. She did see the pulmonologist and has been set up for overnight pulse oximetry test and PFTs. She will f/u in Oct 2017. No new pulm meds Rx.  Multiple joint pain - she reports diffuse arthralgias but especially in legs b/l. Pain is shooting. She also c/o pain in left shoulder that began after fall in bathroom. She hit her shoulder against faucet and bruised it. She c/o severe pain in left medial foot and unable bare weight on it. She had similar pain in right foot that req'd surgical correction. She does not feel she needs orthotic and does not feel it will help. She takes norco and uses voltaren gel. Also takes cymbalta and gabapentin  HTN - BP stable on metoprolol, hydralazine, enalapril, clonidine  Hx CHF - stable on statin, diuretic. Takes asa daily  COPD - currently on duoneb, pulmicort, mucinex dm. Uses HFA TID.   DM with neuropathy - A1c 7% . Insulin dependent (40 units in AM and 25 at PM). Takes gabapentin. BS fluctuating. She has a low BS reaction at times. Followed by diabetic educator  Edema  - stable on demadex and lasix with Kdur  CKD/AOCD - stable  GERD/constipation - stable on nexium. She has emesis with amitiza and miralax  Past Medical History:  Diagnosis Date  . Acute bronchitis   . Anxiety   . Arthritis   . Chronic kidney disease, stage II (mild)   . Complications affecting other specified body systems, hypertension   . Congestive heart failure, unspecified    stress test in March at [facility]  . COPD (chronic obstructive pulmonary disease) ([place])    present for several years, diagnosed in the last few years  . Degenerative  arthritis of knee, bilateral   . Diabetes mellitus   . Diaphragmatic hernia without mention of obstruction or gangrene   . Disorder of bone and cartilage, unspecified   . GERD (gastroesophageal reflux disease)   . Hiatal hernia   . Hyperlipidemia   . Hypertension   . Hypopotassemia   . Insomnia, unspecified   . Lumbago   . Obese   . Osteoarthrosis, unspecified whether generalized or localized, unspecified site   . PONV (postoperative nausea and vomiting)   . Scoliosis   . Secondary diabetes mellitus with renal manifestations, not stated as uncontrolled, or unspecified   . Unspecified glaucoma   . Unspecified hereditary and idiopathic peripheral neuropathy     Past Surgical History:  Procedure Laterality Date  . ABDOMINAL HYSTERECTOMY    . COLONOSCOPY  08/06/2011  . FOOT SURGERY    . KNEE ARTHROSCOPY     bilateral  . SHOULDER SURGERY    . TOTAL HIP ARTHROPLASTY     bilateral    Patient Care Team: [name], NP as PCP - General (Nurse Practitioner) [name], MD as Consulting Physician (Ophthalmology)  Social History   Social History  . Marital status: Widowed    Spouse name: N/A  . Number of children: N/A  . Years of education: N/A   Occupational History  . Retired [employer]   Social History  Main Topics  . Smoking status: Former Smoker    Packs/day: 0.50    Years: 3.00    Types: Cigarettes    Quit date: 02/27/1997  . Smokeless tobacco: Never Used     Comment: remote history - stopped 19 years ago; 20 pack year history  . Alcohol use No  . Drug use: No  . Sexual activity: No   Other Topics Concern  . Not on file   Social History Narrative   Widowed   No children   Never held a steady job     reports that she quit smoking about 19 years ago. Her smoking use included Cigarettes. She has a 1.50 pack-year smoking history. She has never used smokeless tobacco. She reports that she does not drink alcohol or use drugs.  Family History  Problem  Relation Age of Onset  . Diabetes Mother   . Heart disease Mother   . Heart disease Brother   . Diabetes Brother   . Kidney disease Father   . Scoliosis Sister   . Diabetes Brother   . Diabetes Brother   . Heart disease Brother   . Scoliosis Brother   . Stroke Brother   . Heart attack Brother    Family Status  Relation Status  . Mother Deceased  . Brother Deceased  . Father Deceased  . Sister Alive  . Brother Alive  . Brother Alive  . Brother Alive  . Brother Alive  . Son Deceased     Allergies  Allergen Reactions  . Tramadol Other (See Comments)    Leg cramps   . Codeine Nausea And Vomiting    Patient states N/V with codeine  . Hydrocodone Nausea And Vomiting  . Oxycodone Other (See Comments)    Patient states she can tolerate oxycodone  . Tylenol [Acetaminophen] Hives  . Penicillins Rash    Patient states rash/itch with penicillin Has patient had a PCN reaction causing immediate rash, facial/tongue/throat swelling, SOB or lightheadedness with hypotension: Yes- broke me out and i was itching Has patient had a PCN reaction causing severe rash involving mucus membranes or skin necrosis: No Has patient had a PCN reaction that required hospitalization Yes- i was already in the hospital Has patient had a PCN reaction occurring within the last 10 years: No If all of the above answers are "NO    Medications: Patient's Medications  New Prescriptions   No medications on file  Previous Medications   ALBUTEROL (PROVENTIL HFA;VENTOLIN HFA) 108 (90 BASE) MCG/ACT INHALER    Inhale 2 puffs into the lungs every 4 (four) hours as needed for wheezing or shortness of breath.   AMITIZA 24 MCG CAPSULE    TAKE 1 CAPSULE BY MOUTH 2 TIMES A DAY WITH MEALS   ASPIRIN EC 81 MG TABLET    Take 81 mg by mouth daily.   BUDESONIDE (PULMICORT) 0.25 MG/2ML NEBULIZER SOLUTION    Take one ampule in Neb twice daily routinely for COPD  J44.1   CHOLECALCIFEROL (VITAMIN D3) 2000 UNITS TABS    Take  2,000 Units by mouth daily.   CILOSTAZOL (PLETAL) 100 MG TABLET    Take 100 mg by mouth daily.   CLONIDINE (CATAPRES) 0.1 MG TABLET    TAKE 1 TABLET BY MOUTH EVERY DAY   CYCLOSPORINE (RESTASIS) 0.05 % OPHTHALMIC EMULSION    Place 1 drop into both eyes 2 (two) times daily. One drop once a day bilaterally for dry eyes   DEXTROMETHORPHAN-GUAIFENESIN (MUCINEX DM) 30-600  MG 12HR TABLET    Take 2 tablets by mouth 2 (two) times daily.   DICLOFENAC SODIUM (VOLTAREN) 1 % GEL    APPLY 4 GRAM TOPICALLY TWICE DAILY as needed for pain   DULOXETINE (CYMBALTA) 30 MG CAPSULE    TAKE 1 CAPSULE BY MOUTH EVERY DAY TO HELP ANXIETY AND PAINS   EASY TOUCH INSULIN SYRINGE 31G X 5/16" 0.5 ML MISC    USE UP TO 2 TIMES A DAY AS DIRECTED   ENALAPRIL (VASOTEC) 10 MG TABLET    TAKE 1 TABLET BY MOUTH ONCE DAILY FOR BLOOD PRESSURE AND STRENGTHEN HEART   GABAPENTIN (NEURONTIN) 100 MG CAPSULE    TAKE 2 CAPSULES BY MOUTH THREE TIMES A DAY   GUAIFENESIN (ROBITUSSIN) 100 MG/5ML SYRUP    Take 200 mg by mouth 3 (three) times daily as needed for cough.   HYDRALAZINE (APRESOLINE) 50 MG TABLET    TAKE 1 TABLET BY MOUTH TWICE DAILY   HYDROCODONE-ACETAMINOPHEN (NORCO) 10-325 MG TABLET    Take 1 tablet by mouth 3 (three) times daily.   INSULIN NPH-REGULAR HUMAN (NOVOLIN 70/30) (70-30) 100 UNIT/ML INJECTION    Inject 40 units subcutaneous with breakfast and then take 25 units with dinner to control blood sugar.   IPRATROPIUM-ALBUTEROL (DUONEB) 0.5-2.5 (3) MG/3ML SOLN    Take 58ms by nebulization every 6 hours as needed for SOB/Wheezing   METOPROLOL SUCCINATE (TOPROL-XL) 25 MG 24 HR TABLET    TAKE 1 TABLET BY MOUTH EVERY DAY WITH OR IMMEDIATELY FOLLOWING A MEAL FOR BLOOD PRESSURE   NEXIUM 40 MG CAPSULE    TAKE 1 CAPSULE BY MOUTH ONCE DAILY   POLYETHYLENE GLYCOL (MIRALAX / GLYCOLAX) PACKET    DISSOLVE 17 GRAMS (1 PACKET) INTO 8 OUNCES OF LIQUID AND DRINK EVERY DAY   POTASSIUM CHLORIDE SA (K-DUR,KLOR-CON) 20 MEQ TABLET    TAKE 1 TABLET BY MOUTH  EVERY DAY   SERTRALINE (ZOLOFT) 50 MG TABLET    TAKE 1 TABLET BY MOUTH EVERY DAY.   SIMVASTATIN (ZOCOR) 20 MG TABLET    TAKE 1 TABLET BY MOUTH EVERY DAY   SUCRALFATE (CARAFATE) 1 G TABLET    TAKE 1 TABLET BY MOUTH TWICE DAILY ON EMPTY STOMACH.   TIMOLOL (TIMOPTIC) 0.5 % OPHTHALMIC SOLUTION    INSTILL 1 DROP INTO EACH EYE TWICE A DAY   TORSEMIDE (DEMADEX) 20 MG TABLET    Take 1 tablet by mouth daily for edema.   TRAVATAN Z 0.004 % SOLN OPHTHALMIC SOLUTION    Place 1 drop into both eyes at bedtime.   Modified Medications   No medications on file  Discontinued Medications   No medications on file    Review of Systems  Constitutional: Positive for activity change and fatigue.  Eyes: Positive for visual disturbance.  Respiratory: Positive for cough and shortness of breath.   Genitourinary: Positive for genital sores and vaginal discharge.  Musculoskeletal: Positive for arthralgias, gait problem and joint swelling.  Neurological: Positive for weakness and numbness.  All other systems reviewed and are negative.   Vitals:   07/10/16 1528  BP: 128/70  Pulse: 60  Temp: 97.8 F (36.6 C)  TempSrc: Oral  SpO2: 90%  Weight: 216 lb 9.6 oz (98.2 kg)  Height: '5\' 2"'$  (1.575 m)   Body mass index is 39.62 kg/m.  Physical Exam  Constitutional: She is oriented to person, place, and time. She appears well-developed and well-nourished.  HENT:  Mouth/Throat: Oropharynx is clear and moist. No oropharyngeal exudate.  Eyes:  Pupils are equal, round, and reactive to light. No scleral icterus.  Neck: Neck supple. Carotid bruit is not present. No tracheal deviation present. No thyromegaly present.  Cardiovascular: Normal rate, regular rhythm and intact distal pulses.  Exam reveals no gallop and no friction rub.   Murmur (1/6 SEM) heard. +1 pitting LE edema b/l. no calf TTP.   Pulmonary/Chest: No stridor. No respiratory distress. She has decreased breath sounds (b/l at base). She has wheezes (end  expiratory). She has no rales.  Poor inspiratory effort  Abdominal: Soft. Bowel sounds are normal. There is no hepatomegaly.  Genitourinary: There is lesion (b/l sebaceous cyst) on the right labia. There is lesion on the left labia. No erythema in the vagina. Vaginal discharge (clear voluminous d/c, fishy odor) found.  Musculoskeletal: She exhibits edema and tenderness.  Uses rolling walker; left ankle and foot swelling with reduced ROM and TTP; antalgic gait; increased warmth to touch  Lymphadenopathy:    She has no cervical adenopathy.  Neurological: She is alert and oriented to person, place, and time.  Skin: Skin is warm and dry. No rash noted.  Psychiatric: She has a normal mood and affect. Her behavior is normal. Thought content normal.     Labs reviewed: Abstract on 06/12/2016  Component Date Value Ref Range Status  . HM Diabetic Eye Exam 06/11/2016 No Retinopathy  No Retinopathy Final  Office Visit on 06/08/2016  Component Date Value Ref Range Status  . Hgb A1c MFr Bld 06/08/2016 7.0* <5.7 % Final   Comment:   For someone without known diabetes, a hemoglobin A1c value of 6.5% or greater indicates that they may have diabetes and this should be confirmed with a follow-up test.   For someone with known diabetes, a value <7% indicates that their diabetes is well controlled and a value greater than or equal to 7% indicates suboptimal control. A1c targets should be individualized based on duration of diabetes, age, comorbid conditions, and other considerations.   Currently, no consensus exists for use of hemoglobin A1c for diagnosis of diabetes for children.     . Mean Plasma Glucose 06/08/2016 154  mg/dL Final  . Sodium 06/08/2016 143  135 - 146 mmol/L Final  . Potassium 06/08/2016 3.6  3.5 - 5.3 mmol/L Final  . Chloride 06/08/2016 104  98 - 110 mmol/L Final  . CO2 06/08/2016 28  20 - 31 mmol/L Final  . Glucose, Bld 06/08/2016 71  65 - 99 mg/dL Final  . BUN 06/08/2016 22   7 - 25 mg/dL Final  . Creat 06/08/2016 2.07* 0.60 - 0.93 mg/dL Final   Comment:   For patients > or = 78 years of age: The upper reference limit for Creatinine is approximately 13% higher for people identified as African-American.     . Total Bilirubin 06/08/2016 0.3  0.2 - 1.2 mg/dL Final  . Alkaline Phosphatase 06/08/2016 78  33 - 130 U/L Final  . AST 06/08/2016 14  10 - 35 U/L Final  . ALT 06/08/2016 7  6 - 29 U/L Final  . Total Protein 06/08/2016 6.2  6.1 - 8.1 g/dL Final  . Albumin 06/08/2016 3.5* 3.6 - 5.1 g/dL Final  . Calcium 06/08/2016 8.3* 8.6 - 10.4 mg/dL Final  . GFR, Est African American 06/08/2016 26* >=60 mL/min Final  . GFR, Est Non African American 06/08/2016 22* >=60 mL/min Final  . WBC 06/08/2016 7.0  3.8 - 10.8 K/uL Final  . RBC 06/08/2016 3.89  3.80 - 5.10 MIL/uL  Final  . Hemoglobin 06/08/2016 10.2* 11.7 - 15.5 g/dL Final  . HCT 06/08/2016 31.7* 35.0 - 45.0 % Final  . MCV 06/08/2016 81.5  80.0 - 100.0 fL Final  . MCH 06/08/2016 26.2* 27.0 - 33.0 pg Final  . MCHC 06/08/2016 32.2  32.0 - 36.0 g/dL Final  . RDW 06/08/2016 16.6* 11.0 - 15.0 % Final  . Platelets 06/08/2016 222  140 - 400 K/uL Final  . MPV 06/08/2016 10.0  7.5 - 12.5 fL Final  . Neutro Abs 06/08/2016 4620  1,500 - 7,800 cells/uL Final  . Lymphs Abs 06/08/2016 1610  850 - 3,900 cells/uL Final  . Monocytes Absolute 06/08/2016 420  200 - 950 cells/uL Final  . Eosinophils Absolute 06/08/2016 350  15 - 500 cells/uL Final  . Basophils Absolute 06/08/2016 0  0 - 200 cells/uL Final  . Neutrophils Relative % 06/08/2016 66  % Final  . Lymphocytes Relative 06/08/2016 23  % Final  . Monocytes Relative 06/08/2016 6  % Final  . Eosinophils Relative 06/08/2016 5  % Final  . Basophils Relative 06/08/2016 0  % Final  . Smear Review 06/08/2016 Criteria for review not met   Final  Admission on 05/21/2016, Discharged on 05/25/2016  Component Date Value Ref Range Status  . WBC 05/21/2016 8.1  4.0 - 10.5 K/uL Final    . RBC 05/21/2016 4.15  3.87 - 5.11 MIL/uL Final  . Hemoglobin 05/21/2016 10.8* 12.0 - 15.0 g/dL Final  . HCT 05/21/2016 33.6* 36.0 - 46.0 % Final  . MCV 05/21/2016 81.0  78.0 - 100.0 fL Final  . MCH 05/21/2016 26.0  26.0 - 34.0 pg Final  . MCHC 05/21/2016 32.1  30.0 - 36.0 g/dL Final  . RDW 05/21/2016 16.7* 11.5 - 15.5 % Final  . Platelets 05/21/2016 255  150 - 400 K/uL Final  . Neutrophils Relative % 05/21/2016 56  % Final  . Neutro Abs 05/21/2016 4.5  1.7 - 7.7 K/uL Final  . Lymphocytes Relative 05/21/2016 26  % Final  . Lymphs Abs 05/21/2016 2.1  0.7 - 4.0 K/uL Final  . Monocytes Relative 05/21/2016 8  % Final  . Monocytes Absolute 05/21/2016 0.7  0.1 - 1.0 K/uL Final  . Eosinophils Relative 05/21/2016 9  % Final  . Eosinophils Absolute 05/21/2016 0.7  0.0 - 0.7 K/uL Final  . Basophils Relative 05/21/2016 1  % Final  . Basophils Absolute 05/21/2016 0.0  0.0 - 0.1 K/uL Final  . Sodium 05/21/2016 144  135 - 145 mmol/L Final  . Potassium 05/21/2016 3.6  3.5 - 5.1 mmol/L Final  . Chloride 05/21/2016 105  101 - 111 mmol/L Final  . BUN 05/21/2016 27* 6 - 20 mg/dL Final  . Creatinine, Ser 05/21/2016 2.30* 0.44 - 1.00 mg/dL Final  . Glucose, Bld 05/21/2016 126* 65 - 99 mg/dL Final  . Calcium, Ion 05/21/2016 1.14  1.13 - 1.30 mmol/L Final  . TCO2 05/21/2016 27  0 - 100 mmol/L Final  . Hemoglobin 05/21/2016 12.2  12.0 - 15.0 g/dL Final  . HCT 05/21/2016 36.0  36.0 - 46.0 % Final  . B Natriuretic Peptide 05/21/2016 41.5  0.0 - 100.0 pg/mL Final  . Troponin i, poc 05/21/2016 0.02  0.00 - 0.08 ng/mL Final  . Comment 3 05/21/2016          Final   Comment: Due to the release kinetics of cTnI, a negative result within the first hours of the onset of symptoms does not rule out myocardial  infarction with certainty. If myocardial infarction is still suspected, repeat the test at appropriate intervals.   . Sodium 05/22/2016 139  135 - 145 mmol/L Final  . Potassium 05/22/2016 4.0  3.5 - 5.1  mmol/L Final  . Chloride 05/22/2016 104  101 - 111 mmol/L Final  . CO2 05/22/2016 24  22 - 32 mmol/L Final  . Glucose, Bld 05/22/2016 260* 65 - 99 mg/dL Final  . BUN 05/22/2016 27* 6 - 20 mg/dL Final  . Creatinine, Ser 05/22/2016 2.32* 0.44 - 1.00 mg/dL Final  . Calcium 05/22/2016 8.5* 8.9 - 10.3 mg/dL Final  . GFR calc non Af Amer 05/22/2016 19* >60 mL/min Final  . GFR calc Af Amer 05/22/2016 22* >60 mL/min Final   Comment: (NOTE) The eGFR has been calculated using the CKD EPI equation. This calculation has not been validated in all clinical situations. eGFR's persistently <60 mL/min signify possible Chronic Kidney Disease.   . Anion gap 05/22/2016 11  5 - 15 Final  . WBC 05/22/2016 6.6  4.0 - 10.5 K/uL Final  . RBC 05/22/2016 4.04  3.87 - 5.11 MIL/uL Final  . Hemoglobin 05/22/2016 10.5* 12.0 - 15.0 g/dL Final  . HCT 05/22/2016 33.0* 36.0 - 46.0 % Final  . MCV 05/22/2016 81.7  78.0 - 100.0 fL Final  . MCH 05/22/2016 26.0  26.0 - 34.0 pg Final  . MCHC 05/22/2016 31.8  30.0 - 36.0 g/dL Final  . RDW 05/22/2016 16.5* 11.5 - 15.5 % Final  . Platelets 05/22/2016 266  150 - 400 K/uL Final  . Glucose-Capillary 05/22/2016 247* 65 - 99 mg/dL Final  . Comment 1 05/22/2016 Notify RN   Final  . Glucose-Capillary 05/22/2016 183* 65 - 99 mg/dL Final  . Comment 1 05/22/2016 Notify RN   Final  . Comment 2 05/22/2016 Document in Chart   Final  . Glucose-Capillary 05/22/2016 123* 65 - 99 mg/dL Final  . Comment 1 05/22/2016 Notify RN   Final  . Comment 2 05/22/2016 Document in Chart   Final  . WBC 05/23/2016 11.4* 4.0 - 10.5 K/uL Final  . RBC 05/23/2016 3.94  3.87 - 5.11 MIL/uL Final  . Hemoglobin 05/23/2016 10.0* 12.0 - 15.0 g/dL Final  . HCT 05/23/2016 31.0* 36.0 - 46.0 % Final  . MCV 05/23/2016 78.7  78.0 - 100.0 fL Final  . MCH 05/23/2016 25.4* 26.0 - 34.0 pg Final  . MCHC 05/23/2016 32.3  30.0 - 36.0 g/dL Final  . RDW 05/23/2016 16.1* 11.5 - 15.5 % Final  . Platelets 05/23/2016 254  150 -  400 K/uL Final  . Sodium 05/23/2016 138  135 - 145 mmol/L Final  . Potassium 05/23/2016 3.9  3.5 - 5.1 mmol/L Final  . Chloride 05/23/2016 105  101 - 111 mmol/L Final  . CO2 05/23/2016 26  22 - 32 mmol/L Final  . Glucose, Bld 05/23/2016 176* 65 - 99 mg/dL Final  . BUN 05/23/2016 30* 6 - 20 mg/dL Final  . Creatinine, Ser 05/23/2016 2.10* 0.44 - 1.00 mg/dL Final  . Calcium 05/23/2016 8.9  8.9 - 10.3 mg/dL Final  . GFR calc non Af Amer 05/23/2016 22* >60 mL/min Final  . GFR calc Af Amer 05/23/2016 25* >60 mL/min Final   Comment: (NOTE) The eGFR has been calculated using the CKD EPI equation. This calculation has not been validated in all clinical situations. eGFR's persistently <60 mL/min signify possible Chronic Kidney Disease.   . Anion gap 05/23/2016 7  5 - 15 Final  .  Glucose-Capillary 05/22/2016 71  65 - 99 mg/dL Final  . Comment 1 05/22/2016 Notify RN   Final  . Glucose-Capillary 05/23/2016 191* 65 - 99 mg/dL Final  . Glucose-Capillary 05/23/2016 184* 65 - 99 mg/dL Final  . Glucose-Capillary 05/23/2016 107* 65 - 99 mg/dL Final  . WBC 05/24/2016 10.1  4.0 - 10.5 K/uL Final  . RBC 05/24/2016 3.84* 3.87 - 5.11 MIL/uL Final  . Hemoglobin 05/24/2016 10.0* 12.0 - 15.0 g/dL Final  . HCT 05/24/2016 31.3* 36.0 - 46.0 % Final  . MCV 05/24/2016 81.5  78.0 - 100.0 fL Final  . MCH 05/24/2016 26.0  26.0 - 34.0 pg Final  . MCHC 05/24/2016 31.9  30.0 - 36.0 g/dL Final  . RDW 05/24/2016 16.6* 11.5 - 15.5 % Final  . Platelets 05/24/2016 254  150 - 400 K/uL Final  . Sodium 05/24/2016 139  135 - 145 mmol/L Final  . Potassium 05/24/2016 3.6  3.5 - 5.1 mmol/L Final  . Chloride 05/24/2016 107  101 - 111 mmol/L Final  . CO2 05/24/2016 25  22 - 32 mmol/L Final  . Glucose, Bld 05/24/2016 140* 65 - 99 mg/dL Final  . BUN 05/24/2016 35* 6 - 20 mg/dL Final  . Creatinine, Ser 05/24/2016 2.28* 0.44 - 1.00 mg/dL Final  . Calcium 05/24/2016 8.7* 8.9 - 10.3 mg/dL Final  . GFR calc non Af Amer 05/24/2016 20*  >60 mL/min Final  . GFR calc Af Amer 05/24/2016 23* >60 mL/min Final   Comment: (NOTE) The eGFR has been calculated using the CKD EPI equation. This calculation has not been validated in all clinical situations. eGFR's persistently <60 mL/min signify possible Chronic Kidney Disease.   . Anion gap 05/24/2016 7  5 - 15 Final  . Glucose-Capillary 05/23/2016 127* 65 - 99 mg/dL Final  . Glucose-Capillary 05/24/2016 131* 65 - 99 mg/dL Final  . Glucose-Capillary 05/24/2016 138* 65 - 99 mg/dL Final  . Glucose-Capillary 05/24/2016 74  65 - 99 mg/dL Final  . Glucose-Capillary 05/24/2016 93  65 - 99 mg/dL Final  . Comment 1 05/24/2016 Notify RN   Final  . Glucose-Capillary 05/25/2016 70  65 - 99 mg/dL Final  . Glucose-Capillary 05/25/2016 151* 65 - 99 mg/dL Final    Dg Ankle Complete Left  Result Date: 06/12/2016 CLINICAL DATA:  Left foot and ankle pain with swelling x 2 days, patient is diabetic, there is NKI EXAM: LEFT ANKLE COMPLETE - 3+ VIEW COMPARISON:  None. FINDINGS: No fracture. The ankle mortise is normally spaced and aligned. There is subchondral cystic change along the medial tailor dome. The bones are demineralized. There is no bone resorption to suggest osteomyelitis. There is diffuse surrounding soft tissue swelling. IMPRESSION: 1. No fracture or acute bony abnormality. No evidence of osteomyelitis. Electronically Signed   By: Lajean Manes M.D.   On: 06/12/2016 13:11   Dg Foot Complete Left  Result Date: 06/12/2016 CLINICAL DATA:  Left foot and ankle pain with swelling x 2 days, patient is diabetic, there is NKI EXAM: LEFT FOOT - COMPLETE 3+ VIEW COMPARISON:  None. FINDINGS: No fracture.  No dislocation. Bones are demineralized. There is no bone resorption to suggest osteomyelitis. There is mild diffuse soft tissue swelling. IMPRESSION: No fracture or dislocation.  No evidence of osteomyelitis. Electronically Signed   By: Lajean Manes M.D.   On: 06/12/2016 13:10      Assessment/Plan   ICD-9-CM ICD-10-CM   1. Bacterial vaginosis 616.10 N76.0 metroNIDAZOLE (FLAGYL) 500 MG tablet   041.9  A49.9   2. Chronic midline low back pain without sciatica 724.2 M54.5 HYDROcodone-acetaminophen (NORCO) 10-325 MG tablet   338.29 G89.29   3. Chronic obstructive pulmonary disease, unspecified COPD type ([place]) 496 J44.9   4. Type 2 diabetes mellitus with diabetic nephropathy, with long-term current use of insulin (H[facility]) 250.40 E11.21    583.81 Z79.4    V58.67    5. Left foot pain 729.5 M79.672   6. Essential hypertension, benign 401.1 I10   7. Chronic diastolic congestive heart failure ([facility]) 428.32 I50.32    428.0    8. Bilateral edema of lower extremity 782.3 R60.0   9. Sebaceous cyst 706.2 L72.3    Take metronidazole '500mg'$  2 times daily x 7 days for vaginal infection  Continue other medications as ordered  Follow up with specialists as scheduled  Follow up with Tivis Ringer as scheduled  Follow up in 2 mos for routine visit  Michelle Buck  [facility] [address] [place], [place] [address] [phone] Cell (Monday-Friday 8 AM - 5 PM) [phone] After 5 PM and follow prompts

## 2016-07-14 ENCOUNTER — Encounter: Payer: Self-pay | Admitting: Internal Medicine

## 2016-07-14 DIAGNOSIS — J449 Chronic obstructive pulmonary disease, unspecified: Secondary | ICD-10-CM | POA: Diagnosis not present

## 2016-07-14 DIAGNOSIS — R5383 Other fatigue: Secondary | ICD-10-CM | POA: Diagnosis not present

## 2016-07-20 ENCOUNTER — Telehealth: Payer: Self-pay | Admitting: Internal Medicine

## 2016-07-20 ENCOUNTER — Encounter: Payer: Self-pay | Admitting: Sports Medicine

## 2016-07-20 ENCOUNTER — Ambulatory Visit (INDEPENDENT_AMBULATORY_CARE_PROVIDER_SITE_OTHER): Payer: Medicare Other | Admitting: Sports Medicine

## 2016-07-20 DIAGNOSIS — E1149 Type 2 diabetes mellitus with other diabetic neurological complication: Secondary | ICD-10-CM

## 2016-07-20 DIAGNOSIS — M2141 Flat foot [pes planus] (acquired), right foot: Secondary | ICD-10-CM | POA: Diagnosis not present

## 2016-07-20 DIAGNOSIS — M2142 Flat foot [pes planus] (acquired), left foot: Secondary | ICD-10-CM | POA: Diagnosis not present

## 2016-07-20 DIAGNOSIS — I739 Peripheral vascular disease, unspecified: Secondary | ICD-10-CM

## 2016-07-20 DIAGNOSIS — J9601 Acute respiratory failure with hypoxia: Secondary | ICD-10-CM

## 2016-07-20 DIAGNOSIS — J410 Simple chronic bronchitis: Secondary | ICD-10-CM

## 2016-07-20 DIAGNOSIS — M19079 Primary osteoarthritis, unspecified ankle and foot: Secondary | ICD-10-CM | POA: Diagnosis not present

## 2016-07-20 MED ORDER — DICLOFENAC SODIUM 75 MG PO TBEC: 75.0000 mg | DELAYED_RELEASE_TABLET | Freq: Two times a day (BID) | ORAL | 0 refills | Status: DC

## 2016-07-20 NOTE — Progress Notes (Signed)
Subjective: Michelle Buck is a 80 y.o. female patient who presents to office for evaluation of Left>right foot/ankle pain. Patient complains of progressive pain especially over the last few months but last week got really bad where it felt like it was hard to walk. Soaked with Alcohol which helped. Patient denies any other pedal complaints. Admits to history of ankle surgery on right years ago. Denies recent injury/trip/fall/sprain/any causative factors.   Patient Active Problem List   Diagnosis Date Noted  . Other fatigue 06/23/2016  . COPD exacerbation ([place]) 05/21/2016  . Acute on chronic respiratory failure ([place]) 05/21/2016  . PAD (peripheral artery disease) ([place]) 05/21/2016  . Glaucoma 05/21/2016  . Bilateral edema of lower extremity   . Leg edema 01/01/2016  . Acute CHF (congestive heart failure) ([place]) 01/01/2016  . CKD stage 3 due to type 2 diabetes mellitus ([place])   . Healthcare-associated pneumonia   . CHF (congestive heart failure) ([place]) 10/29/2015  . Chronic obstructive pulmonary disease ([place]) 10/29/2015  . Lumbago   . CKD (chronic kidney disease)   . Cellulitis 09/11/2015  . Hypomagnesemia 09/11/2015  . Chronic kidney disease (CKD), stage IV (severe) ([place]) 09/11/2015  . Diabetes mellitus with diabetic nephropathy, with long-term current use of insulin ([place]) 09/11/2015  . Anemia of chronic kidney failure 09/11/2015  . COPD with acute exacerbation ([place]) 09/11/2015  . Morbid obesity ([place]) 08/14/2015  . Acute respiratory failure with hypoxia ([place]) 08/12/2015  . Peripheral autonomic neuropathy due to diabetes mellitus ([place]) 10/24/2013  . Essential hypertension, benign 02/27/2013  . Acute on chronic diastolic congestive heart failure ([place]) 03/02/2012  . Diabetes mellitus type 2 in obese ([place]) 03/02/2012    Current Outpatient Prescriptions on File Prior to Visit  Medication Sig Dispense Refill  . albuterol (PROVENTIL HFA;VENTOLIN HFA) 108 (90 Base) MCG/ACT inhaler Inhale 2  puffs into the lungs every 4 (four) hours as needed for wheezing or shortness of breath.    . AMITIZA 24 MCG capsule TAKE 1 CAPSULE BY MOUTH 2 TIMES A DAY WITH MEALS 60 capsule 4  . aspirin EC 81 MG tablet Take 81 mg by mouth daily.    . budesonide (PULMICORT) 0.25 MG/2ML nebulizer solution Take one ampule in Neb twice daily routinely for COPD  J44.1 120 mL 6  . Cholecalciferol (VITAMIN D3) 2000 units TABS Take 2,000 Units by mouth daily.    . cilostazol (PLETAL) 100 MG tablet Take 100 mg by mouth daily.    . cloNIDine (CATAPRES) 0.1 MG tablet TAKE 1 TABLET BY MOUTH EVERY DAY 30 tablet 3  . cycloSPORINE (RESTASIS) 0.05 % ophthalmic emulsion Place 1 drop into both eyes 2 (two) times daily. One drop once a day bilaterally for dry eyes    . dextromethorphan-guaiFENesin (MUCINEX DM) 30-600 MG 12hr tablet Take 2 tablets by mouth 2 (two) times daily. 14 tablet 0  . diclofenac sodium (VOLTAREN) 1 % GEL APPLY 4 GRAM TOPICALLY TWICE DAILY as needed for pain 200 g 6  . DULoxetine (CYMBALTA) 30 MG capsule TAKE 1 CAPSULE BY MOUTH EVERY DAY TO HELP ANXIETY AND PAINS 30 capsule 5  . EASY TOUCH INSULIN SYRINGE 31G X 5/16" 0.5 ML MISC USE UP TO 2 TIMES A DAY AS DIRECTED 100 each 1  . enalapril (VASOTEC) 10 MG tablet TAKE 1 TABLET BY MOUTH ONCE DAILY FOR BLOOD PRESSURE AND STRENGTHEN HEART 30 tablet 5  . gabapentin (NEURONTIN) 100 MG capsule TAKE 2 CAPSULES BY MOUTH THREE TIMES A DAY 180 capsule 4  . guaifenesin (  ROBITUSSIN) 100 MG/5ML syrup Take 200 mg by mouth 3 (three) times daily as needed for cough.    . hydrALAZINE (APRESOLINE) 50 MG tablet TAKE 1 TABLET BY MOUTH TWICE DAILY 60 tablet 3  . HYDROcodone-acetaminophen (NORCO) 10-325 MG tablet Take 1 tablet by mouth 3 (three) times daily. 90 tablet 0  . insulin NPH-regular Human (NOVOLIN 70/30) (70-30) 100 UNIT/ML injection Inject 40 units subcutaneous with breakfast and then take 25 units with dinner to control blood sugar.    . ipratropium-albuterol (DUONEB)  0.5-2.5 (3) MG/3ML SOLN Take 54mls by nebulization every 6 hours as needed for SOB/Wheezing 360 mL 3  . metoprolol succinate (TOPROL-XL) 25 MG 24 hr tablet TAKE 1 TABLET BY MOUTH EVERY DAY WITH OR IMMEDIATELY FOLLOWING A MEAL FOR BLOOD PRESSURE 30 tablet 5  . NEXIUM 40 MG capsule TAKE 1 CAPSULE BY MOUTH ONCE DAILY 30 capsule 5  . polyethylene glycol (MIRALAX / GLYCOLAX) packet DISSOLVE 17 GRAMS (1 PACKET) INTO 8 OUNCES OF LIQUID AND DRINK EVERY DAY 30 each 1  . potassium chloride SA (K-DUR,KLOR-CON) 20 MEQ tablet TAKE 1 TABLET BY MOUTH EVERY DAY 30 tablet 3  . sertraline (ZOLOFT) 50 MG tablet TAKE 1 TABLET BY MOUTH EVERY DAY. 30 tablet 3  . simvastatin (ZOCOR) 20 MG tablet TAKE 1 TABLET BY MOUTH EVERY DAY 30 tablet 5  . sucralfate (CARAFATE) 1 g tablet TAKE 1 TABLET BY MOUTH TWICE DAILY ON EMPTY STOMACH. 60 tablet 3  . timolol (TIMOPTIC) 0.5 % ophthalmic solution INSTILL 1 DROP INTO EACH EYE TWICE A DAY 5 mL 4  . torsemide (DEMADEX) 20 MG tablet Take 1 tablet by mouth daily for edema. 90 tablet 2  . TRAVATAN Z 0.004 % SOLN ophthalmic solution Place 1 drop into both eyes at bedtime.      No current facility-administered medications on file prior to visit.     Allergies  Allergen Reactions  . Tramadol Other (See Comments)    Leg cramps   . Codeine Nausea And Vomiting    Patient states N/V with codeine  . Hydrocodone Nausea And Vomiting  . Oxycodone Other (See Comments)    Patient states she can tolerate oxycodone  . Tylenol [Acetaminophen] Hives  . Penicillins Rash    Patient states rash/itch with penicillin Has patient had a PCN reaction causing immediate rash, facial/tongue/throat swelling, SOB or lightheadedness with hypotension: Yes- broke me out and i was itching Has patient had a PCN reaction causing severe rash involving mucus membranes or skin necrosis: No Has patient had a PCN reaction that required hospitalization Yes- i was already in the hospital Has patient had a PCN reaction  occurring within the last 10 years: No If all of the above answers are "NO    Objective:  General: Alert and oriented x3 in no acute distress, Chronic Dyspnea on Exertion, Rolling walker  Dermatology: Well healed scar at right ankle, No open lesions bilateral lower extremities, no webspace macerations, no ecchymosis bilateral, all nails x 10 are short and thick but well manicured.  Vascular: Dorsalis Pedis and Posterior Tibial pedal pulses difficult to palpate due to 1+ edema with trophic skin changes, Capillary Fill Time 3 seconds,(-) pedal hair growth bilateral, Temperature gradient within normal limits.  Neurology: Johney Maine sensation intact via light touch bilateral, Protective sensation intact with Thornell Mule Monofilament to all pedal sites, vibratory diminished bilateral, (- )Tinels sign bilateral.   Musculoskeletal: General pain all over on both feet and ankles and anterior shins bilateral. Limited range  of motion diffusely bilateral, Severe planus. Strength within normal limits in all groups bilateral.   Assessment and Plan: Problem List Items Addressed This Visit    None    Visit Diagnoses    Osteoarthritis of ankle and foot, unspecified laterality    -  Primary   Relevant Medications   diclofenac (VOLTAREN) 75 MG EC tablet   Pes planus of both feet       Relevant Medications   diclofenac (VOLTAREN) 75 MG EC tablet   PVD (peripheral vascular disease) (HCC)       Relevant Medications   diclofenac (VOLTAREN) 75 MG EC tablet   Type II diabetes mellitus with neurological manifestations (HCC)          -Complete examination performed -Previous Xrays reviewed -Discussed treatement options for likely arthritic vs pain secondary to edema -Rx Diclofenac to take when can't apply topical -Recommend to use Voltaren topical as instructed -Recommend Epsom salt soaks with assistance from a family member as needed -Recommend to follow up with PCP/Cardiologist in re: Compression  stockings -Recommend good supportive shoes and walker in gait -Patient to return to office as needed or sooner if condition worsens.  Landis Martins, DPM

## 2016-07-20 NOTE — Telephone Encounter (Signed)
Overnight oxygen desaturation study done on room air 07/15/2016 shows the longest continuous. With pulse ox less than 88% was 2 hours and 17 minutes and 20 seconds. Oxygen desaturation index was 42.6. Less than or equal to 88% was 92.7% of sleep time at 10 hours and 38 minutes.  Plan - Start 2 L oxygen at night - Refer sleep specialist within our practice  Dr. Brand Males, M.D., [facility].C.P Pulmonary and Critical Care Medicine Staff Physician [facility] Pager: [phone], If no answer or between  15:00h - 7:00h: call [phone]  07/20/2016 5:36 PM

## 2016-07-21 NOTE — Telephone Encounter (Signed)
Called and spoke with pt and she is aware of results of sleep study.  Order placed to get pt set up on oxygen.  appt scheduled with RA on 10-4 at 1030 and the pt is aware.

## 2016-08-04 ENCOUNTER — Other Ambulatory Visit: Payer: Self-pay | Admitting: Sports Medicine

## 2016-08-04 ENCOUNTER — Other Ambulatory Visit: Payer: Self-pay | Admitting: Nurse Practitioner

## 2016-08-04 DIAGNOSIS — M19079 Primary osteoarthritis, unspecified ankle and foot: Secondary | ICD-10-CM

## 2016-08-04 DIAGNOSIS — M2142 Flat foot [pes planus] (acquired), left foot: Secondary | ICD-10-CM

## 2016-08-04 DIAGNOSIS — M2141 Flat foot [pes planus] (acquired), right foot: Secondary | ICD-10-CM

## 2016-08-04 DIAGNOSIS — I739 Peripheral vascular disease, unspecified: Secondary | ICD-10-CM

## 2016-08-12 ENCOUNTER — Other Ambulatory Visit: Payer: Self-pay | Admitting: *Deleted

## 2016-08-12 DIAGNOSIS — G8929 Other chronic pain: Secondary | ICD-10-CM

## 2016-08-12 DIAGNOSIS — M545 Low back pain: Principal | ICD-10-CM

## 2016-08-12 MED ORDER — HYDROCODONE-ACETAMINOPHEN 10-325 MG PO TABS: 1.0000 | ORAL_TABLET | Freq: Three times a day (TID) | ORAL | 0 refills | Status: DC

## 2016-08-12 NOTE — Telephone Encounter (Signed)
Patient requested and will pick up 

## 2016-08-28 ENCOUNTER — Telehealth: Payer: Self-pay | Admitting: *Deleted

## 2016-08-28 NOTE — Telephone Encounter (Signed)
Michelle Buck - PAP request 30 day rx of Diclofenac 75mg  #30. I reviewed LOV and Dr. Cannon Kettle wanted to see pt back if problems. I spoke with [name] - PAP and informed that we would not order for 30 days, Dr. Cannon Kettle wanted to see pt if continued problems.

## 2016-09-01 ENCOUNTER — Other Ambulatory Visit: Payer: Self-pay | Admitting: Sports Medicine

## 2016-09-01 ENCOUNTER — Other Ambulatory Visit: Payer: Self-pay | Admitting: Internal Medicine

## 2016-09-01 ENCOUNTER — Other Ambulatory Visit: Payer: Self-pay | Admitting: Pharmacotherapy

## 2016-09-01 ENCOUNTER — Institutional Professional Consult (permissible substitution): Payer: Self-pay | Admitting: Pulmonary Disease

## 2016-09-01 DIAGNOSIS — M19079 Primary osteoarthritis, unspecified ankle and foot: Secondary | ICD-10-CM

## 2016-09-01 DIAGNOSIS — I739 Peripheral vascular disease, unspecified: Secondary | ICD-10-CM

## 2016-09-01 DIAGNOSIS — M2142 Flat foot [pes planus] (acquired), left foot: Secondary | ICD-10-CM

## 2016-09-01 DIAGNOSIS — M2141 Flat foot [pes planus] (acquired), right foot: Secondary | ICD-10-CM

## 2016-09-02 ENCOUNTER — Encounter: Payer: Self-pay | Admitting: Pulmonary Disease

## 2016-09-02 ENCOUNTER — Ambulatory Visit (INDEPENDENT_AMBULATORY_CARE_PROVIDER_SITE_OTHER): Payer: Medicare Other | Admitting: Pulmonary Disease

## 2016-09-02 VITALS — BP 170/70 | HR 58 | Ht 62.0 in | Wt 221.0 lb

## 2016-09-02 DIAGNOSIS — J441 Chronic obstructive pulmonary disease with (acute) exacerbation: Secondary | ICD-10-CM | POA: Diagnosis not present

## 2016-09-02 DIAGNOSIS — I5032 Chronic diastolic (congestive) heart failure: Secondary | ICD-10-CM

## 2016-09-02 DIAGNOSIS — J449 Chronic obstructive pulmonary disease, unspecified: Secondary | ICD-10-CM | POA: Diagnosis not present

## 2016-09-02 DIAGNOSIS — Z23 Encounter for immunization: Secondary | ICD-10-CM

## 2016-09-02 DIAGNOSIS — G4733 Obstructive sleep apnea (adult) (pediatric): Secondary | ICD-10-CM

## 2016-09-02 NOTE — Telephone Encounter (Signed)
Pt needs an appt prior to future refills. 

## 2016-09-02 NOTE — Assessment & Plan Note (Signed)
Given excessive daytime somnolence, narrow pharyngeal exam, witnessed apneas & loud snoring, obstructive sleep apnea is very likely & an overnight polysomnogram will be scheduled as a split study. The pathophysiology of obstructive sleep apnea , it's cardiovascular consequences & modes of treatment including CPAP were discused with the patient in detail & they evidenced understanding.  Nocturnal oximetry shows a sawtooth pattern of desaturation which is very typical of OSA with some sustained desaturations present V during REM sleep suggesting hypoventilation. This would be very typical of overlap syndrome with COPD and OSA

## 2016-09-02 NOTE — Progress Notes (Signed)
Subjective:    Patient ID: Michelle Buck, female    DOB: 1936-10-08, 80 y.o.   MRN: [id number]  HPI  Chief Complaint  Patient presents with  . Sleep Consult    Referred by Dr. Chase Caller; snoring; witnessed apneas; ES: 74    80 year old diabetic, hypertensive, CK D presents for evaluation of sleep-disordered breathing. She smoked for about 20 pack years and quit at age 18 and carries a diagnosis of COPD. She has been unable to be formally tested for pulmonary function due to transportation issues. She is very deconditioned due to multiple spine and joint issues and uses an ambulatory walker. She saw my partner Dr. Chase Caller in 05/2016 for COPD issues, primary function test was set up which was never performed. She had an overnight oximetry which showed sawtooth desaturations for the entire duration of sleep about 10 hours and some sustained desaturations (presumably suggesting hypoventilation during REM sleep ) Hence she was referred for sleep evaluation. She now has nocturnal oxygen and reports compliance  She reports excessive daytime somnolence and frequent daytime naps. She is often falling asleep at family occasions and sometimes even while speaking to family members ! Epworth sleepiness score is listed as 24  Bedtime is around 11 PM, sleep latency is minimal, she sleeps on her back with 4 pillows, reports frequent nocturnal awakenings and non-refreshing sleep and gets out of bed as early as 4 AM. She takes her medication including her insulin shots and then naps in a recliner for a few more hours. She gets out of bed around 9 AM and again has frequent naps during the daytime. Loud snoring has been noted by family members. Since no bed partner history is available we do not know about witnessed apneas but she does report snoring occasionally waking herself up from sleep  There is no history suggestive of cataplexy, sleep paralysis or parasomnias  She reports chronic bipedal edema and is not  sure whether she takes Lasix or not. She does have a fentanyl patch as ordered for chronic pain but does not seem to take this on a daily basis.  Significant tests/ events  Spirometry 08/2016 shows moderate restriction with a ratio of 80, FVC 59%, FEV1 62% Echo 02/2012 shows grade 2 diastolic dysfunction   Past Medical History:  Diagnosis Date  . Acute bronchitis   . Anxiety   . Arthritis   . Chronic kidney disease, stage II (mild)   . Complications affecting other specified body systems, hypertension   . Congestive heart failure, unspecified    stress test in March at [facility]  . COPD (chronic obstructive pulmonary disease) ([place])    present for several years, diagnosed in the last few years  . Degenerative arthritis of knee, bilateral   . Diabetes mellitus   . Diaphragmatic hernia without mention of obstruction or gangrene   . Disorder of bone and cartilage, unspecified   . GERD (gastroesophageal reflux disease)   . Hiatal hernia   . Hyperlipidemia   . Hypertension   . Hypopotassemia   . Insomnia, unspecified   . Lumbago   . Obese   . Osteoarthrosis, unspecified whether generalized or localized, unspecified site   . PONV (postoperative nausea and vomiting)   . Scoliosis   . Secondary diabetes mellitus with renal manifestations, not stated as uncontrolled, or unspecified(249.40)   . Unspecified glaucoma(365.9)   . Unspecified hereditary and idiopathic peripheral neuropathy     Past Surgical History:  Procedure Laterality Date  . ABDOMINAL  HYSTERECTOMY    . COLONOSCOPY  08/06/2011  . FOOT SURGERY    . KNEE ARTHROSCOPY     bilateral  . SHOULDER SURGERY    . TOTAL HIP ARTHROPLASTY     bilateral     Allergies  Allergen Reactions  . Tramadol Other (See Comments)    Leg cramps   . Codeine Nausea And Vomiting    Patient states N/V with codeine  . Hydrocodone Nausea And Vomiting  . Oxycodone Other (See Comments)    Patient states she can tolerate oxycodone    . Tylenol [Acetaminophen] Hives  . Penicillins Rash    Patient states rash/itch with penicillin Has patient had a PCN reaction causing immediate rash, facial/tongue/throat swelling, SOB or lightheadedness with hypotension: Yes- broke me out and i was itching Has patient had a PCN reaction causing severe rash involving mucus membranes or skin necrosis: No Has patient had a PCN reaction that required hospitalization Yes- i was already in the hospital Has patient had a PCN reaction occurring within the last 10 years: No If all of the above answers are "NO     Social History   Social History  . Marital status: Widowed    Spouse name: N/A  . Number of children: N/A  . Years of education: N/A   Occupational History  . Retired Unemployed   Social History Main Topics  . Smoking status: Former Smoker    Packs/day: 0.50    Years: 3.00    Types: Cigarettes    Quit date: 02/27/1997  . Smokeless tobacco: Never Used     Comment: remote history - stopped 19 years ago; 20 pack year history  . Alcohol use No  . Drug use: No  . Sexual activity: No   Other Topics Concern  . Not on file   Social History Narrative   Widowed   No children   Never held a steady job     Family History  Problem Relation Age of Onset  . Diabetes Mother   . Heart disease Mother   . Heart disease Brother   . Diabetes Brother   . Kidney disease Father   . Scoliosis Sister   . Diabetes Brother   . Diabetes Brother   . Heart disease Brother   . Scoliosis Brother   . Stroke Brother   . Heart attack Brother      Review of Systems  Constitutional: Negative for chills, fever and unexpected weight change.  HENT: Negative for congestion, dental problem, ear pain, nosebleeds, postnasal drip, rhinorrhea, sinus pressure, sneezing, sore throat, trouble swallowing and voice change.   Eyes: Negative for visual disturbance.  Respiratory: Negative for cough, choking and shortness of breath.   Cardiovascular:  Negative for chest pain and leg swelling.  Gastrointestinal: Negative for abdominal pain, diarrhea and vomiting.  Genitourinary: Negative for difficulty urinating.  Musculoskeletal: Negative for arthralgias.  Skin: Negative for rash.  Neurological: Negative for tremors, syncope and headaches.  Hematological: Does not bruise/bleed easily.       Objective:   Physical Exam  Gen. Pleasant, obese, in no distress, normal affect ENT - no lesions, no post nasal drip, class 3 airway Neck: No JVD, no thyromegaly, no carotid bruits Lungs: no use of accessory muscles, no dullness to percussion, decreased without rales or rhonchi  Cardiovascular: Rhythm regular, heart sounds  normal, no murmurs or gallops, 2+ peripheral edema Abdomen: soft and non-tender, no hepatosplenomegaly, BS normal. Musculoskeletal: No deformities, no cyanosis or clubbing Neuro:  alert, non focal, no tremors       Assessment & Plan:

## 2016-09-02 NOTE — Assessment & Plan Note (Signed)
Spirometry shows more restriction rather than airway obstruction hence I will not escalate bronchodilator therapy.

## 2016-09-02 NOTE — Assessment & Plan Note (Signed)
She will resume her Lasix

## 2016-09-02 NOTE — Patient Instructions (Addendum)
Sleep study will be scheduled- based on this we will get you a CPAP machine if required Continue using her oxygen during sleep until then

## 2016-09-07 ENCOUNTER — Ambulatory Visit (HOSPITAL_BASED_OUTPATIENT_CLINIC_OR_DEPARTMENT_OTHER): Payer: Medicare Other | Attending: Pulmonary Disease | Admitting: Pulmonary Disease

## 2016-09-07 DIAGNOSIS — G4733 Obstructive sleep apnea (adult) (pediatric): Secondary | ICD-10-CM | POA: Diagnosis not present

## 2016-09-09 ENCOUNTER — Telehealth: Payer: Self-pay | Admitting: Pulmonary Disease

## 2016-09-09 DIAGNOSIS — G4733 Obstructive sleep apnea (adult) (pediatric): Secondary | ICD-10-CM

## 2016-09-09 NOTE — Procedures (Signed)
Patient Name: Michelle Buck, Michelle Buck Date: 09/07/2016 Gender: Female D.O.B: [birth year] Age (years): 52 Referring Provider: Kara Mead MD, ABSM Height (inches): 62 Interpreting Physician: Kara Mead MD, ABSM Weight (lbs): 220 RPSGT: Madelon Lips BMI: 40 MRN: [id number] Neck Size: 17.00   CLINICAL INFORMATION Sleep Study Type: Split Night CPAP Indication for sleep study: OSA Epworth Sleepiness Score: 15   SLEEP STUDY TECHNIQUE As per the AASM Manual for the Scoring of Sleep and Associated Events v2.3 (April 2016) with a hypopnea requiring 4% desaturations. The channels recorded and monitored were frontal, central and occipital EEG, electrooculogram (EOG), submentalis EMG (chin), nasal and oral airflow, thoracic and abdominal wall motion, anterior tibialis EMG, snore microphone, electrocardiogram, and pulse oximetry. Continuous positive airway pressure (CPAP) was initiated when the patient met split night criteria and was titrated according to treat sleep-disordered breathing.   RESPIRATORY PARAMETERS Diagnostic Total AHI (/hr): 66.5 RDI (/hr): 67.2 OA Index (/hr): 66.1 CA Index (/hr): 0.0 REM AHI (/hr): N/A NREM AHI (/hr): 66.5 Supine AHI (/hr): N/A Non-supine AHI (/hr): 66.49 Min O2 Sat (%): 86.00 Mean O2 (%): 95.30 Time below 88% (min): 0.2   Titration Optimal Pressure (cm): 16 AHI at Optimal Pressure (/hr): 11.8 Min O2 at Optimal Pressure (%): 87.0 Supine % at Optimal (%): 0 Sleep % at Optimal (%): 100     SLEEP ARCHITECTURE The recording time for the entire night was 399.9 minutes. During a baseline period of 193.1 minutes, the patient slept for 168.8 minutes in REM and nonREM, yielding a sleep efficiency of 87.4%. Sleep onset after lights out was 1.3 minutes with a REM latency of N/A minutes. The patient spent 27.26% of the night in stage N1 sleep, 72.45% in stage N2 sleep, 0.30% in stage N3 and 0.00% in REM. During the titration period of 201.9 minutes, the patient slept for  126.0 minutes in REM and nonREM, yielding a sleep efficiency of 62.4%. Sleep onset after CPAP initiation was 5.8 minutes with a REM latency of 150.5 minutes. The patient spent 21.43% of the night in stage N1 sleep, 41.67% in stage N2 sleep, 0.79% in stage N3 and 36.11% in REM.   CARDIAC DATA The 2 lead EKG demonstrated sinus rhythm. The mean heart rate was 58.20 beats per minute. Other EKG findings include: PVCs.   LEG MOVEMENT DATA The total Periodic Limb Movements of Sleep (PLMS) were 0. The PLMS index was 0.00 .   IMPRESSIONS - Severe obstructive sleep apnea occurred during the diagnostic portion of the study (AHI = 66.5/hour). A sub- optimal PAP pressure was selected for this patient ( 16 cm of water).  Residual AHI of 12/h notde at this level - No significant central sleep apnea occurred during the diagnostic portion of the study (CAI = 0.0/hour). - Severe oxygen desaturation was noted during the diagnostic portion of the study (Min O2 = 86.00%). - The patient snored with Loud snoring volume during the diagnostic portion of the study. - EKG findings include PVCs. - Clinically significant periodic limb movements did not occur during sleep.   DIAGNOSIS - Obstructive Sleep Apnea (327.23 [G47.33 ICD-10])    RECOMMENDATIONS - Trial of CPAP therapy on 16 cm H2O with a Large size Resmed Full Face Mask AirFit F20 mask and heated humidification. - Avoid alcohol, sedatives and other CNS depressants that may worsen sleep apnea and disrupt normal sleep architecture. - Sleep hygiene should be reviewed to assess factors that may improve sleep quality. - Weight management and regular exercise should be initiated or  continued. - Return to Sleep Center for re-evaluation after 4 weeks of therapy   Kara Mead MD Board Certified in [place]

## 2016-09-09 NOTE — Telephone Encounter (Signed)
Pl send RX for  CPAP therapy on 16 cm H2O with a Large size Resmed Full Face Mask AirFit F20 mask and heated humidification. DL in 4 wks OV in 6 wks

## 2016-09-14 ENCOUNTER — Ambulatory Visit (INDEPENDENT_AMBULATORY_CARE_PROVIDER_SITE_OTHER): Payer: Medicare Other | Admitting: Pharmacotherapy

## 2016-09-14 ENCOUNTER — Encounter: Payer: Self-pay | Admitting: Pharmacotherapy

## 2016-09-14 ENCOUNTER — Ambulatory Visit
Admission: RE | Admit: 2016-09-14 | Discharge: 2016-09-14 | Disposition: A | Discharge status: Home / Self Care | Payer: Medicare Other | Source: Ambulatory Visit | Attending: Pharmacotherapy | Admitting: Pharmacotherapy

## 2016-09-14 VITALS — BP 120/58 | HR 63 | Temp 98.0°F | Resp 20 | Ht 62.0 in | Wt 221.0 lb

## 2016-09-14 DIAGNOSIS — Z794 Long term (current) use of insulin: Secondary | ICD-10-CM

## 2016-09-14 DIAGNOSIS — N140 Analgesic nephropathy: Secondary | ICD-10-CM | POA: Diagnosis not present

## 2016-09-14 DIAGNOSIS — I10 Essential (primary) hypertension: Secondary | ICD-10-CM

## 2016-09-14 DIAGNOSIS — I129 Hypertensive chronic kidney disease with stage 1 through stage 4 chronic kidney disease, or unspecified chronic kidney disease: Secondary | ICD-10-CM | POA: Diagnosis not present

## 2016-09-14 DIAGNOSIS — E1121 Type 2 diabetes mellitus with diabetic nephropathy: Secondary | ICD-10-CM

## 2016-09-14 DIAGNOSIS — N183 Chronic kidney disease, stage 3 (moderate): Secondary | ICD-10-CM | POA: Diagnosis not present

## 2016-09-14 DIAGNOSIS — R05 Cough: Secondary | ICD-10-CM | POA: Diagnosis not present

## 2016-09-14 DIAGNOSIS — R0602 Shortness of breath: Secondary | ICD-10-CM | POA: Diagnosis not present

## 2016-09-14 DIAGNOSIS — J441 Chronic obstructive pulmonary disease with (acute) exacerbation: Secondary | ICD-10-CM

## 2016-09-14 LAB — COMPLETE METABOLIC PANEL WITH GFR
ALT: 6 U/L (ref 6–29)
AST: 13 U/L (ref 10–35)
Albumin: 3.7 g/dL (ref 3.6–5.1)
Alkaline Phosphatase: 95 U/L (ref 33–130)
BUN: 36 mg/dL — ABNORMAL HIGH (ref 7–25)
CO2: 26 mmol/L (ref 20–31)
Calcium: 9.1 mg/dL (ref 8.6–10.4)
Chloride: 104 mmol/L (ref 98–110)
Creat: 2.8 mg/dL — ABNORMAL HIGH (ref 0.60–0.93)
GFR, Est African American: 18 mL/min — ABNORMAL LOW (ref >=60)
GFR, Est Non African American: 16 mL/min — ABNORMAL LOW (ref >=60)
Glucose, Bld: 143 mg/dL — ABNORMAL HIGH (ref 65–99)
Potassium: 3.9 mmol/L (ref 3.5–5.3)
Sodium: 141 mmol/L (ref 135–146)
Total Bilirubin: 0.3 mg/dL (ref 0.2–1.2)
Total Protein: 6.8 g/dL (ref 6.1–8.1)

## 2016-09-14 LAB — HEMOGLOBIN A1C
Hgb A1c MFr Bld: 6.5 % — ABNORMAL HIGH (ref <5.7)
Mean Plasma Glucose: 140 mg/dL

## 2016-09-14 LAB — CBC WITH DIFFERENTIAL/PLATELET
Basophils Absolute: 0 cells/uL (ref 0–200)
Basophils Relative: 0 %
Eosinophils Absolute: 490 cells/uL (ref 15–500)
Eosinophils Relative: 7 %
HCT: 31.5 % — ABNORMAL LOW (ref 35.0–45.0)
Hemoglobin: 9.9 g/dL — ABNORMAL LOW (ref 11.7–15.5)
Lymphocytes Relative: 32 %
Lymphs Abs: 2240 cells/uL (ref 850–3900)
MCH: 25.6 pg — ABNORMAL LOW (ref 27.0–33.0)
MCHC: 31.4 g/dL — ABNORMAL LOW (ref 32.0–36.0)
MCV: 81.4 fL (ref 80.0–100.0)
MPV: 9.9 fL (ref 7.5–12.5)
Monocytes Absolute: 490 cells/uL (ref 200–950)
Monocytes Relative: 7 %
Neutro Abs: 3780 cells/uL (ref 1500–7800)
Neutrophils Relative %: 54 %
Platelets: 272 10*3/uL (ref 140–400)
RBC: 3.87 MIL/uL (ref 3.80–5.10)
RDW: 17 % — ABNORMAL HIGH (ref 11.0–15.0)
WBC: 7 10*3/uL (ref 3.8–10.8)

## 2016-09-14 MED ORDER — IPRATROPIUM-ALBUTEROL 0.5-2.5 (3) MG/3ML IN SOLN: 3.0000 mL | Freq: Four times a day (QID) | RESPIRATORY_TRACT | Status: DC

## 2016-09-14 NOTE — Addendum Note (Signed)
Addended by: Elijah Birk L on: 09/14/2016 11:41 AM   Modules accepted: Orders

## 2016-09-14 NOTE — Progress Notes (Signed)
  Subjective:    Michelle Buck is a 79 y.o.African American female who presents for follow-up of Type 2 diabetes mellitus.  Last A1C was 7.0% (06/08/16) She is currently on 70/30 - 40 units breakfast, 25 units supper.  Has a cold.  Chest and sides are sore from coughing. Has a headache. Wears supplemental oxygen at home. She is not wearing her CPAP machine. Has been feeling bad for about a week.  No fever, but feels cold.  She did not bring her meter. Says her morning BG 100-110mg /dl Not checking BG in the afternoon.  She is trying to make healthy food choices. No routine exercise.  "I walk a little bit" Having some blurry vision.  Sees floaters. Has peripheral edema. Pain in her feet.  No evidence of sores. Nocturia 3 times per night. No dysuria. Staying well hydrated with water.    Review of Systems A comprehensive review of systems was negative except for: Constitutional: positive for fatigue Eyes: positive for visual disturbance and blurry vision Respiratory: positive for cough Cardiovascular: positive for dyspnea and lower extremity edema Genitourinary: positive for nocturia Musculoskeletal: positive for stiff joints    Objective:    BP (!) 120/58   Pulse 63   Temp 98 F (36.7 C) (Oral)   Resp 20   Ht 5\' 2"  (1.575 m)   Wt 221 lb (100.2 kg)   BMI 40.42 kg/m   General:  alert, fatigued and mild distress  Oropharynx: normal findings: lips normal without lesions and gums healthy   Eyes:  negative findings: lids and lashes normal and conjunctivae and sclerae normal   Ears:  external ears normal        Lung: wheezes bilaterally  Heart:  regular rate and rhythm     Extremities: edema bilateral LE  Skin: dry     Neuro: mental status, speech normal, alert and oriented x3 and gait and station normal   Lab Review Glucose, Bld (mg/dL)  Date Value  06/08/2016 71  05/24/2016 140 (H)  05/23/2016 176 (H)   CO2 (mmol/L)  Date Value  06/08/2016 28  05/24/2016  25  05/23/2016 26   BUN (mg/dL)  Date Value  06/08/2016 22  05/24/2016 35 (H)  05/23/2016 30 (H)  02/26/2016 27  01/07/2016 21  10/15/2015 27   Creat (mg/dL)  Date Value  06/08/2016 2.07 (H)   Creatinine, Ser (mg/dL)  Date Value  05/24/2016 2.28 (H)  05/23/2016 2.10 (H)  05/22/2016 2.32 (H)   A1C, CMP, CBC    Assessment:    Diabetes Mellitus type II, under fair control. Will check A1C today prior to making any changes to insulin dose. BP at goal <140/90 COPD - feels bad, wheezes bilaterally, examined by Dr Mariea Clonts.   Plan:    1.  Rx changes: DuoNeb treatment today in office.  Sending for CXR per Dr Mariea Clonts to R/O pneumonia.  2.  Continue 70/30 insulin 40 units breakfast, 25 units supper pending A1C results. 3.  BP at goal <140/90 4.  Counseled on nutrition goals. 5.  Exercise goal is 30-45 minutes 5 x week. 6.  Counseled on importance of wearing her CPAP machine every night.

## 2016-09-14 NOTE — Telephone Encounter (Signed)
Rx sent to CPAP.  Attempted to contact patient, left message for her to call back.

## 2016-09-14 NOTE — Patient Instructions (Addendum)
Go to [facility] for a chest X ray  Continue same dose insulin for now.

## 2016-09-16 ENCOUNTER — Other Ambulatory Visit: Payer: Self-pay | Admitting: Sports Medicine

## 2016-09-16 DIAGNOSIS — M19079 Primary osteoarthritis, unspecified ankle and foot: Secondary | ICD-10-CM

## 2016-09-16 DIAGNOSIS — M2141 Flat foot [pes planus] (acquired), right foot: Secondary | ICD-10-CM

## 2016-09-16 DIAGNOSIS — I739 Peripheral vascular disease, unspecified: Secondary | ICD-10-CM

## 2016-09-16 DIAGNOSIS — M2142 Flat foot [pes planus] (acquired), left foot: Secondary | ICD-10-CM

## 2016-09-18 ENCOUNTER — Ambulatory Visit (INDEPENDENT_AMBULATORY_CARE_PROVIDER_SITE_OTHER): Payer: Medicare Other | Admitting: Internal Medicine

## 2016-09-18 ENCOUNTER — Encounter: Payer: Self-pay | Admitting: Internal Medicine

## 2016-09-18 VITALS — BP 128/78 | HR 61 | Temp 97.6°F | Ht 62.0 in | Wt 221.4 lb

## 2016-09-18 DIAGNOSIS — R600 Localized edema: Secondary | ICD-10-CM

## 2016-09-18 DIAGNOSIS — G8929 Other chronic pain: Secondary | ICD-10-CM | POA: Diagnosis not present

## 2016-09-18 DIAGNOSIS — M545 Low back pain: Secondary | ICD-10-CM

## 2016-09-18 DIAGNOSIS — I5032 Chronic diastolic (congestive) heart failure: Secondary | ICD-10-CM

## 2016-09-18 DIAGNOSIS — J449 Chronic obstructive pulmonary disease, unspecified: Secondary | ICD-10-CM | POA: Diagnosis not present

## 2016-09-18 DIAGNOSIS — I10 Essential (primary) hypertension: Secondary | ICD-10-CM

## 2016-09-18 DIAGNOSIS — H6692 Otitis media, unspecified, left ear: Secondary | ICD-10-CM | POA: Diagnosis not present

## 2016-09-18 DIAGNOSIS — Z794 Long term (current) use of insulin: Secondary | ICD-10-CM

## 2016-09-18 DIAGNOSIS — N184 Chronic kidney disease, stage 4 (severe): Secondary | ICD-10-CM | POA: Diagnosis not present

## 2016-09-18 DIAGNOSIS — E1121 Type 2 diabetes mellitus with diabetic nephropathy: Secondary | ICD-10-CM

## 2016-09-18 MED ORDER — CEFUROXIME AXETIL 250 MG PO TABS: 250.0000 mg | ORAL_TABLET | Freq: Every day | ORAL | 0 refills | Status: DC

## 2016-09-18 MED ORDER — HYDROCODONE-ACETAMINOPHEN 10-325 MG PO TABS: 1.0000 | ORAL_TABLET | Freq: Four times a day (QID) | ORAL | 0 refills | Status: DC

## 2016-09-18 NOTE — Progress Notes (Signed)
Patient ID: Michelle Buck, female   DOB: 24-Dec-1935, 80 y.o.   MRN: [id number]    Location:  [facility] Place of Service: OFFICE  Chief Complaint  Patient presents with  . Medical Management of Chronic Issues    2 months routine visit  . Dizziness    head hurts     HPI:  80 yo female seen today for f/u. She has dizziness x 2 weeks. She has associated frontal HA, cough, rhinorrhea when wearing [place] O2. No sinus pressure. No sore throat. She saw pulmonary and was placed on [place] O2 due to hypoxia and sleep study ordered. Sleep study revealed severe obstructive sleep apnea and she will need CPAP with O2. She was supposed to have fitting this AM but appt changed to next week.   Multiple joint pain - she reports diffuse arthralgias but especially in legs b/l. Pain is shooting. She also c/o pain in left shoulder that began after fall in  Some time ago. She hit her shoulder against faucet and bruised it. She c/o severe pain in left medial foot and unable bare weight on it. She had similar pain in right foot that req'd surgical correction. She does not feel she needs orthotic and does not feel it will help. She takes norco and uses voltaren gel. Also takes cymbalta and gabapentin. She has fallen since last OV - fall witnessed. She was attempting to climb into vehicle when it occurred. No head trauma nor other injury per pt. She did not seek medical attention  HTN - BP stable on metoprolol, hydralazine. Enalapril and clonidine stopped by nephrology  Hx CHF - stable on statin, diuretic. Takes asa daily  COPD - currently on duoneb, pulmicort, mucinex dm. Uses HFA TID. Followed by pulmonary  DM with neuropathy - A1c 6.5% . Insulin dependent (40 units in AM and 25 at PM). Takes gabapentin. BS fluctuating. She has a low BS reaction at times. BS 90-100s. Followed by diabetic educator  Edema  - stable on demadex and lasix with Kdur  CKD/AOCD - stage 4. Cr 2.8. Hgb 9.9. Followed by nephrology Dr [name] at  [facility]  GERD/constipation - stable on nexium. She has emesis with amitiza and miralax but still takes them  Past Medical History:  Diagnosis Date  . Acute bronchitis   . Anxiety   . Arthritis   . Chronic kidney disease, stage II (mild)   . Complications affecting other specified body systems, hypertension   . Congestive heart failure, unspecified    stress test in March at [facility]  . COPD (chronic obstructive pulmonary disease) ([place])    present for several years, diagnosed in the last few years  . Degenerative arthritis of knee, bilateral   . Diabetes mellitus   . Diaphragmatic hernia without mention of obstruction or gangrene   . Disorder of bone and cartilage, unspecified   . GERD (gastroesophageal reflux disease)   . Hiatal hernia   . Hyperlipidemia   . Hypertension   . Hypopotassemia   . Insomnia, unspecified   . Lumbago   . Obese   . Osteoarthrosis, unspecified whether generalized or localized, unspecified site   . PONV (postoperative nausea and vomiting)   . Scoliosis   . Secondary diabetes mellitus with renal manifestations, not stated as uncontrolled, or unspecified(249.40)   . Unspecified glaucoma(365.9)   . Unspecified hereditary and idiopathic peripheral neuropathy     Past Surgical History:  Procedure Laterality Date  . ABDOMINAL HYSTERECTOMY    .  COLONOSCOPY  08/06/2011  . FOOT SURGERY    . KNEE ARTHROSCOPY     bilateral  . SHOULDER SURGERY    . TOTAL HIP ARTHROPLASTY     bilateral    Patient Care Team: [name], NP as PCP - General (Nurse Practitioner) [name], MD as Consulting Physician (Ophthalmology)  Social History   Social History  . Marital status: Widowed    Spouse name: N/A  . Number of children: N/A  . Years of education: N/A   Occupational History  . Retired Unemployed   Social History Main Topics  . Smoking status: Former Smoker    Packs/day: 0.50    Years: 3.00    Types: Cigarettes      Quit date: 02/27/1997  . Smokeless tobacco: Never Used     Comment: remote history - stopped 19 years ago; 20 pack year history  . Alcohol use No  . Drug use: No  . Sexual activity: No   Other Topics Concern  . Not on file   Social History Narrative   Widowed   No children   Never held a steady job     reports that she quit smoking about 19 years ago. Her smoking use included Cigarettes. She has a 1.50 pack-year smoking history. She has never used smokeless tobacco. She reports that she does not drink alcohol or use drugs.  Family History  Problem Relation Age of Onset  . Diabetes Mother   . Heart disease Mother   . Heart disease Brother   . Diabetes Brother   . Kidney disease Father   . Scoliosis Sister   . Diabetes Brother   . Diabetes Brother   . Heart disease Brother   . Scoliosis Brother   . Stroke Brother   . Heart attack Brother    Family Status  Relation Status  . Mother Deceased  . Brother Deceased  . Father Deceased  . Sister Alive  . Brother Alive  . Brother Alive  . Brother Alive  . Brother Alive  . Son Deceased     Allergies  Allergen Reactions  . Tramadol Other (See Comments)    Leg cramps   . Codeine Nausea And Vomiting    Patient states N/V with codeine  . Hydrocodone Nausea And Vomiting  . Oxycodone Other (See Comments)    Patient states she can tolerate oxycodone  . Tylenol [Acetaminophen] Hives  . Penicillins Rash    Patient states rash/itch with penicillin Has patient had a PCN reaction causing immediate rash, facial/tongue/throat swelling, SOB or lightheadedness with hypotension: Yes- broke me out and i was itching Has patient had a PCN reaction causing severe rash involving mucus membranes or skin necrosis: No Has patient had a PCN reaction that required hospitalization Yes- i was already in the hospital Has patient had a PCN reaction occurring within the last 10 years: No If all of the above answers are "NO     Medications: Patient's Medications  New Prescriptions   No medications on file  Previous Medications   ALBUTEROL (PROVENTIL HFA;VENTOLIN HFA) 108 (90 BASE) MCG/ACT INHALER    Inhale 2 puffs into the lungs every 4 (four) hours as needed for wheezing or shortness of breath.   AMITIZA 24 MCG CAPSULE    TAKE 1 CAPSULE BY MOUTH TWICE DAILY WITH MEALS   ASPIRIN EC 81 MG TABLET    Take 81 mg by mouth daily.   BUDESONIDE (PULMICORT) 0.25 MG/2ML NEBULIZER SOLUTION  Take one ampule in Neb twice daily routinely for COPD  J44.1   CHOLECALCIFEROL (VITAMIN D3) 2000 UNITS TABS    Take 2,000 Units by mouth daily.   CILOSTAZOL (PLETAL) 100 MG TABLET    Take 100 mg by mouth daily.   CLONIDINE (CATAPRES) 0.1 MG TABLET    TAKE 1 TABLET BY MOUTH EVERY DAY   CYCLOSPORINE (RESTASIS) 0.05 % OPHTHALMIC EMULSION    Place 1 drop into both eyes 2 (two) times daily. One drop once a day bilaterally for dry eyes   DEXTROMETHORPHAN-GUAIFENESIN (MUCINEX DM) 30-600 MG 12HR TABLET    Take 2 tablets by mouth 2 (two) times daily.   DICLOFENAC (VOLTAREN) 75 MG EC TABLET    TAKE 1 TABLET BY MOUTH 2 TIMES A DAY   DICLOFENAC SODIUM (VOLTAREN) 1 % GEL    APPLY 4 GRAM TOPICALLY TWICE DAILY as needed for pain   DULOXETINE (CYMBALTA) 30 MG CAPSULE    TAKE 1 CAPSULE BY MOUTH EVERY DAY TO HELP ANXIETY AND PAINS   EASY TOUCH INSULIN SYRINGE 31G X 5/16" 0.5 ML MISC    USE UP TO 2 TIMES A DAY AS DIRECTED.   ENALAPRIL (VASOTEC) 10 MG TABLET    TAKE 1 TABLET BY MOUTH ONCE DAILY FOR BLOOD PRESSURE AND STRENGTHEN HEART   FENTANYL (DURAGESIC - DOSED MCG/HR) 25 MCG/HR PATCH       FUROSEMIDE (LASIX) 40 MG TABLET       GABAPENTIN (NEURONTIN) 100 MG CAPSULE    TAKE 2 CAPSULES BY MOUTH THREE TIMES A DAY   GUAIFENESIN (ROBITUSSIN) 100 MG/5ML SYRUP    Take 200 mg by mouth 3 (three) times daily as needed for cough.   HYDRALAZINE (APRESOLINE) 50 MG TABLET    TAKE 1 TABLET BY MOUTH TWICE DAILY   HYDROCODONE-ACETAMINOPHEN (NORCO) 10-325 MG TABLET     Take 1 tablet by mouth 3 (three) times daily.   INSULIN NPH-REGULAR HUMAN (NOVOLIN 70/30) (70-30) 100 UNIT/ML INJECTION    Inject 40 units subcutaneous with breakfast and then take 25 units with dinner to control blood sugar.   IPRATROPIUM-ALBUTEROL (DUONEB) 0.5-2.5 (3) MG/3ML SOLN    Take 46ms by nebulization every 6 hours as needed for SOB/Wheezing   METOPROLOL SUCCINATE (TOPROL-XL) 25 MG 24 HR TABLET    TAKE 1 TABLET BY MOUTH EVERY DAY WITH OR IMMEDIATELY FOLLOWING A MEAL FOR BLOOD PRESSURE   NEXIUM 40 MG CAPSULE    TAKE 1 CAPSULE BY MOUTH ONCE DAILY   POLYETHYLENE GLYCOL (MIRALAX / GLYCOLAX) PACKET    DISSOLVE 17 GRAMS (1 PACKET) INTO 8 OUNCES OF LIQUID AND DRINK EVERY DAY.   POTASSIUM CHLORIDE SA (K-DUR,KLOR-CON) 20 MEQ TABLET    TAKE 1 TABLET BY MOUTH EVERY DAY   SERTRALINE (ZOLOFT) 50 MG TABLET    TAKE 1 TABLET BY MOUTH EVERY DAY.   SIMVASTATIN (ZOCOR) 20 MG TABLET    TAKE 1 TABLET BY MOUTH EVERY DAY   SUCRALFATE (CARAFATE) 1 G TABLET    TAKE 1 TABLET BY MOUTH TWICE DAILY ON EMPTY STOMACH.   TIMOLOL (TIMOPTIC) 0.5 % OPHTHALMIC SOLUTION    INSTILL 1 DROP INTO EACH EYE TWICE A DAY   TORSEMIDE (DEMADEX) 20 MG TABLET    Take 1 tablet by mouth daily for edema.   TRAVATAN Z 0.004 % SOLN OPHTHALMIC SOLUTION    Place 1 drop into both eyes at bedtime.   Modified Medications   No medications on file  Discontinued Medications   No medications on file  Review of Systems  HENT: Positive for congestion and rhinorrhea.   Respiratory: Positive for cough and shortness of breath.   Cardiovascular: Positive for leg swelling.  Musculoskeletal: Positive for arthralgias.  Neurological: Positive for dizziness.  All other systems reviewed and are negative.   Vitals:   09/18/16 1116  BP: 128/78  Pulse: 61  Temp: 97.6 F (36.4 C)  TempSrc: Oral  SpO2: (!) 89%  Weight: 221 lb 6.4 oz (100.4 kg)  Height: '5\' 2"'$  (1.575 m)   Body mass index is 40.49 kg/m.  Physical Exam  Constitutional: She is  oriented to person, place, and time. She appears well-developed and well-nourished.  HENT:  Mouth/Throat: Oropharynx is clear and moist. No oropharyngeal exudate.  Right TM dull but intact and no redness. Left TM dull, bulging and red but intact  Eyes: Pupils are equal, round, and reactive to light. No scleral icterus.  Neck: Neck supple. Carotid bruit is not present. No tracheal deviation present. No thyromegaly present.  Cardiovascular: Normal rate, regular rhythm and intact distal pulses.  Exam reveals no gallop and no friction rub.   Murmur (1/6 SEM) heard. +1 pitting LE edema b/l. no calf TTP.   Pulmonary/Chest: No stridor. No respiratory distress. She has decreased breath sounds (b/l at base). She has no wheezes. She has no rales.  Poor inspiratory effort  Abdominal: Soft. Bowel sounds are normal. She exhibits no distension and no mass. There is no hepatomegaly. There is no tenderness. There is no rebound and no guarding.  obese  Musculoskeletal: She exhibits edema and tenderness.  Uses rolling walker; left ankle and foot swelling with reduced ROM and TTP; antalgic gait; increased warmth to touch  Lymphadenopathy:    She has no cervical adenopathy.  Neurological: She is alert and oriented to person, place, and time.  Skin: Skin is warm and dry. No rash noted.  Psychiatric: She has a normal mood and affect. Her behavior is normal. Thought content normal.     Labs reviewed: Office Visit on 09/14/2016  Component Date Value Ref Range Status  . Hgb A1c MFr Bld 09/14/2016 6.5* <5.7 % Final   Comment:   For someone without known diabetes, a hemoglobin A1c value of 6.5% or greater indicates that they may have diabetes and this should be confirmed with a follow-up test.   For someone with known diabetes, a value <7% indicates that their diabetes is well controlled and a value greater than or equal to 7% indicates suboptimal control. A1c targets should be individualized based on  duration of diabetes, age, comorbid conditions, and other considerations.   Currently, no consensus exists for use of hemoglobin A1c for diagnosis of diabetes for children.     . Mean Plasma Glucose 09/14/2016 140  mg/dL Final  . Sodium 09/14/2016 141  135 - 146 mmol/L Final  . Potassium 09/14/2016 3.9  3.5 - 5.3 mmol/L Final  . Chloride 09/14/2016 104  98 - 110 mmol/L Final  . CO2 09/14/2016 26  20 - 31 mmol/L Final  . Glucose, Bld 09/14/2016 143* 65 - 99 mg/dL Final  . BUN 09/14/2016 36* 7 - 25 mg/dL Final  . Creat 09/14/2016 2.80* 0.60 - 0.93 mg/dL Final   Comment:   For patients > or = 78 years of age: The upper reference limit for Creatinine is approximately 13% higher for people identified as African-American.     . Total Bilirubin 09/14/2016 0.3  0.2 - 1.2 mg/dL Final  . Alkaline Phosphatase 09/14/2016 95  33 - 130 U/L Final  . AST 09/14/2016 13  10 - 35 U/L Final  . ALT 09/14/2016 6  6 - 29 U/L Final  . Total Protein 09/14/2016 6.8  6.1 - 8.1 g/dL Final  . Albumin 09/14/2016 3.7  3.6 - 5.1 g/dL Final  . Calcium 09/14/2016 9.1  8.6 - 10.4 mg/dL Final  . GFR, Est African American 09/14/2016 18* >=60 mL/min Final  . GFR, Est Non African American 09/14/2016 16* >=60 mL/min Final  . WBC 09/14/2016 7.0  3.8 - 10.8 K/uL Final  . RBC 09/14/2016 3.87  3.80 - 5.10 MIL/uL Final  . Hemoglobin 09/14/2016 9.9* 11.7 - 15.5 g/dL Final  . HCT 09/14/2016 31.5* 35.0 - 45.0 % Final  . MCV 09/14/2016 81.4  80.0 - 100.0 fL Final  . MCH 09/14/2016 25.6* 27.0 - 33.0 pg Final  . MCHC 09/14/2016 31.4* 32.0 - 36.0 g/dL Final  . RDW 09/14/2016 17.0* 11.0 - 15.0 % Final  . Platelets 09/14/2016 272  140 - 400 K/uL Final  . MPV 09/14/2016 9.9  7.5 - 12.5 fL Final  . Neutro Abs 09/14/2016 3780  1,500 - 7,800 cells/uL Final  . Lymphs Abs 09/14/2016 2240  850 - 3,900 cells/uL Final  . Monocytes Absolute 09/14/2016 490  200 - 950 cells/uL Final  . Eosinophils Absolute 09/14/2016 490  15 - 500  cells/uL Final  . Basophils Absolute 09/14/2016 0  0 - 200 cells/uL Final  . Neutrophils Relative % 09/14/2016 54  % Final  . Lymphocytes Relative 09/14/2016 32  % Final  . Monocytes Relative 09/14/2016 7  % Final  . Eosinophils Relative 09/14/2016 7  % Final  . Basophils Relative 09/14/2016 0  % Final  . Smear Review 09/14/2016 Criteria for review not met   Final    Dg Chest 2 View  Result Date: 09/14/2016 CLINICAL DATA:  Cough, shortness of breath EXAM: CHEST  2 VIEW COMPARISON:  Chest radiograph 05/21/2016 FINDINGS: Heart remains enlarged with calcifications seen within the aortic arch. There is shallow lung inflation with central pulmonary vascular congestion. No overt pulmonary edema. No pneumothorax or sizable pleural effusion. No focal airspace consolidation. IMPRESSION: Cardiomegaly and aortic atherosclerosis. Central pulmonary vascular congestion without overt pulmonary edema. Electronically Signed   By: Ulyses Jarred M.D.   On: 09/14/2016 14:34     Assessment/Plan   ICD-9-CM ICD-10-CM   1. Left otitis media, unspecified otitis media type 382.9 H66.92   2. Chronic midline low back pain without sciatica 724.2 M54.5 HYDROcodone-acetaminophen (NORCO) 10-325 MG tablet   338.29 G89.29   3. Essential hypertension, benign 401.1 I10   4. Type 2 diabetes mellitus with diabetic nephropathy, with long-term current use of insulin (HCC) 250.40 E11.21    583.81 Z79.4    V58.67    5. Chronic obstructive pulmonary disease, unspecified COPD type ([place]) 496 J44.9   6. Bilateral edema of lower extremity 782.3 R60.0   7. Chronic kidney disease (CKD), stage IV (severe) (HCC) 585.4 N18.4   8. Chronic diastolic congestive heart failure (HCC) 428.32 I50.32    428.0     Take antibiotic daily x 10 days for ear infection  Eat yogurt daily while on antibiotic for colon health  Continue other medications as ordered  Follow up with specialists as scheduled  Follow up in 3 mos for routine visit.   Keep appt with [facility] [place], [place] [address] [phone] Cell (Monday-Friday 8 AM - 5 PM) [phone] After 5 PM and follow prompts

## 2016-09-18 NOTE — Progress Notes (Signed)
Her renal function is significantly worse - does she have a nephrologist?

## 2016-09-18 NOTE — Progress Notes (Signed)
Results of the CXR ordered Monday

## 2016-09-18 NOTE — Patient Instructions (Signed)
Take antibiotic daily x 10 days for ear infection  Eat yogurt daily while on antibiotic for colon health  Continue other medications as ordered  Follow up with specialists as scheduled  Follow up in 3 mos for routine visit.  Keep appt with Tivis Ringer

## 2016-09-23 ENCOUNTER — Ambulatory Visit (INDEPENDENT_AMBULATORY_CARE_PROVIDER_SITE_OTHER): Payer: Medicare Other | Admitting: Internal Medicine

## 2016-09-23 ENCOUNTER — Other Ambulatory Visit: Payer: Self-pay | Admitting: Pharmacotherapy

## 2016-09-23 ENCOUNTER — Encounter: Payer: Self-pay | Admitting: Internal Medicine

## 2016-09-23 VITALS — BP 144/86 | HR 69 | Ht 62.0 in | Wt 220.0 lb

## 2016-09-23 DIAGNOSIS — R0689 Other abnormalities of breathing: Secondary | ICD-10-CM

## 2016-09-23 DIAGNOSIS — J449 Chronic obstructive pulmonary disease, unspecified: Secondary | ICD-10-CM

## 2016-09-23 DIAGNOSIS — G4733 Obstructive sleep apnea (adult) (pediatric): Secondary | ICD-10-CM

## 2016-09-23 DIAGNOSIS — R5383 Other fatigue: Secondary | ICD-10-CM

## 2016-09-23 DIAGNOSIS — R0600 Dyspnea, unspecified: Secondary | ICD-10-CM

## 2016-09-23 DIAGNOSIS — R0602 Shortness of breath: Secondary | ICD-10-CM | POA: Insufficient documentation

## 2016-09-23 LAB — PULMONARY FUNCTION TEST
FEF 25-75 POST: 0.83 L/s
FEF 25-75 PRE: 0.71 L/s
FEF2575-%Change-Post: 18 %
FEF2575-%PRED-POST: 67 %
FEF2575-%PRED-PRE: 57 %
FEV1-%CHANGE-POST: 5 %
FEV1-%PRED-POST: 66 %
FEV1-%PRED-PRE: 62 %
FEV1-POST: 0.95 L
FEV1-Pre: 0.9 L
FEV1FVC-%Change-Post: 6 %
FEV1FVC-%PRED-PRE: 100 %
FEV6-%CHANGE-POST: 0 %
FEV6-%PRED-POST: 65 %
FEV6-%Pred-Pre: 65 %
FEV6-POST: 1.17 L
FEV6-Pre: 1.17 L
FEV6FVC-%CHANGE-POST: 0 %
FEV6FVC-%PRED-POST: 105 %
FEV6FVC-%Pred-Pre: 104 %
FVC-%Change-Post: 0 %
FVC-%Pred-Post: 62 %
FVC-%Pred-Pre: 62 %
FVC-Post: 1.17 L
FVC-Pre: 1.18 L
POST FEV1/FVC RATIO: 81 %
PRE FEV1/FVC RATIO: 76 %
PRE FEV6/FVC RATIO: 100 %
Post FEV6/FVC ratio: 100 %

## 2016-09-23 NOTE — Patient Instructions (Addendum)
ICD-9-CM ICD-10-CM   1. Chronic obstructive pulmonary disease, unspecified COPD type ([place]) 496 J44.9   2. OSA (obstructive sleep apnea) 327.23 G47.33   3. Dyspnea and respiratory abnormality 786.09 R06.00     R06.89   4. Other fatigue 780.79 R53.83     Copd is stable Continue duoneb and pulmicort neb - unable to offer therapies more than this Lot of your breathing test abnormality and symptoms related to weight and sleep apnea  Nurse will update you on status of your CPAP therapy supply Continue o2 at night REcommend home PT -talk to pcp EUBANKS, JESSICA K, NP  Glad  Followup - fu Dr Elsworth Soho - he can address both copd and sleep apnea - and sleep apnea appears bigger problem

## 2016-09-23 NOTE — Progress Notes (Signed)
Subjective:     Patient ID: Michelle Buck, female   DOB: 1936/08/18, 80 y.o.   MRN: [id number]  HPI    PCP [name], NP  PCP .pvp  HPI  IOV 06/23/2016  Chief Complaint  Patient presents with  . Pulmonary Consult    referred by Dr Gildardo Cranker to evaluate for COPD - c/o increased SOB, prod cough with small amounts of yellow mucus x1 month.     80 year old obese female ph  from multiple spinal and joint issues. She has been referred for COPD. She carries a diagnosis of COPD. Her smoking history has been elicited again. She started at age 78 and quit smoking at age 69 having smoked half pack per day. There for 15 pack smoking history. She reports insidious onset of shortness of breath and cough for the last 1 year. She is an extremely poor historian and questions her to be asked in multiple different ways. Symptoms are moderate in intensity to severe. They are progressive. She rates her shortness of breath on a scale of 4 out of 5. Rates her cough as 3 out of 5. Up until a month ago the sputum was mild and was clear but in the last 1 month after her discharge from her COPD exacerbation in June 2017 sputum has a yellow tint to it. She rates her fatigue is 6 out of 5. She is so fatigued that she does not want him walking desaturation test. In addition she feels she cannot do pulmonary rehabilitation because of her joint issues. She had COPD exacerbation diagnosis 05/21/2016 discharge date. She was hospitalized for 2 days. At that time she was considered hypoxemic at admission but discharged without oxygen. At this point she does not want herself tested for oxygen with exertion.   She takes care of her 78 year old who lives with her. This person who works for her. In addition she gets a visit with her. She's been using a walker for the last 2 years because of her joint issues.   CT angiogram 02/29/2012: Personally visualized. Has scoliosis. Has large hiatal hernia. Has right-sided  atelectasis  Chest x-ray 05/21/2016: Personally visualized. She has elevation of right side of the diaphragm. She has hiatal hernia.  Nuclear medicine cardiac stress test 12/20/2015: Normal with ejection fraction 65%  HIV test 12/05/2015: Nonreactive  Echocardiogram 01/01/2016: Ejection fraction 65%. Diastolic dysfunction grade 1. Pulmonary artery systolic pressure 13  Blood work 06/08/2016: Creatinine 2.07 mg percent with a reduced GFR of 20. Hemoglobin 10.0 g percent and stable.   Pulmonary function test: Noticed that it's never been done.   OV 09/23/2016  Chief Complaint  Patient presents with  . Follow-up    Pt saw RA on 10.4.17 for an acute visit and states she feels improved but not back to baseline. Pt c/o prod cough with yellow mucus. Pt deneis f/c/s.    Follow-up multiplesymptoms with evaluation for COPD. Today's follow-up after overnight oxygen study and spirometry. Overnight oxygen study showed significant desaturations. Then early this month she met with Dr. Elsworth Soho who confirmed sleep apnea and has started him on CPAP therapy. She is yet to start CPAP therapy. This time she gives a slightly better history and admits to significant shortness of breath and fatigue. She is on DuoNeb and Pulmicort nebulizers. It appears fatigued and physical deconditioning of the biggest issues. She is going to start CPAP therapy. She is now using oxygen at night. Spirometry showed significant restriction with FVC 62% and FEV1 66%  without any broncho-dilator response. There are no other new issues.   Overnight oxygen desaturation study done on room air 07/15/2016 shows the longest continuous. With pulse ox less than 88% was 2 hours and 17 minutes and 20 seconds. Oxygen desaturation index was 42.6. Less than or equal to 88% was 92.7% of sleep time at 10 hours and 38 minutes.     has a past medical history of Acute bronchitis; Anxiety; Arthritis; Chronic kidney disease, stage II (mild);  Complications affecting other specified body systems, hypertension; Congestive heart failure, unspecified; COPD (chronic obstructive pulmonary disease) ([place]); Degenerative arthritis of knee, bilateral; Diabetes mellitus; Diaphragmatic hernia without mention of obstruction or gangrene; Disorder of bone and cartilage, unspecified; GERD (gastroesophageal reflux disease); Hiatal hernia; Hyperlipidemia; Hypertension; Hypopotassemia; Insomnia, unspecified; Lumbago; Obese; Osteoarthrosis, unspecified whether generalized or localized, unspecified site; PONV (postoperative nausea and vomiting); Scoliosis; Secondary diabetes mellitus with renal manifestations, not stated as uncontrolled, or unspecified(249.40); Unspecified glaucoma(365.9); and Unspecified hereditary and idiopathic peripheral neuropathy.   reports that she quit smoking about 19 years ago. Her smoking use included Cigarettes. She has a 1.50 pack-year smoking history. She has never used smokeless tobacco.  Past Surgical History:  Procedure Laterality Date  . ABDOMINAL HYSTERECTOMY    . COLONOSCOPY  08/06/2011  . FOOT SURGERY    . KNEE ARTHROSCOPY     bilateral  . SHOULDER SURGERY    . TOTAL HIP ARTHROPLASTY     bilateral    Allergies  Allergen Reactions  . Tramadol Other (See Comments)    Leg cramps   . Codeine Nausea And Vomiting    Patient states N/V with codeine  . Hydrocodone Nausea And Vomiting  . Oxycodone Other (See Comments)    Patient states she can tolerate oxycodone  . Tylenol [Acetaminophen] Hives  . Penicillins Rash    Patient states rash/itch with penicillin Has patient had a PCN reaction causing immediate rash, facial/tongue/throat swelling, SOB or lightheadedness with hypotension: Yes- broke me out and i was itching Has patient had a PCN reaction causing severe rash involving mucus membranes or skin necrosis: No Has patient had a PCN reaction that required hospitalization Yes- i was already in the hospital Has patient  had a PCN reaction occurring within the last 10 years: No If all of the above answers are "NO    Immunization History  Administered Date(s) Administered  . Influenza Split 09/20/2012  . Influenza, High Dose Seasonal PF 09/02/2016  . Influenza,inj,Quad PF,36+ Mos 10/24/2013, 10/10/2014, 08/20/2015  . Pneumococcal Conjugate-13 02/19/2015  . Pneumococcal Polysaccharide-23 12/01/2011    Family History  Problem Relation Age of Onset  . Diabetes Mother   . Heart disease Mother   . Heart disease Brother   . Diabetes Brother   . Kidney disease Father   . Scoliosis Sister   . Diabetes Brother   . Diabetes Brother   . Heart disease Brother   . Scoliosis Brother   . Stroke Brother   . Heart attack Brother      Current Outpatient Prescriptions:  .  albuterol (PROVENTIL HFA;VENTOLIN HFA) 108 (90 Base) MCG/ACT inhaler, Inhale 2 puffs into the lungs every 4 (four) hours as needed for wheezing or shortness of breath., Disp: , Rfl:  .  AMITIZA 24 MCG capsule, TAKE 1 CAPSULE BY MOUTH TWICE DAILY WITH MEALS, Disp: 60 capsule, Rfl: 5 .  aspirin EC 81 MG tablet, Take 81 mg by mouth daily., Disp: , Rfl:  .  budesonide (PULMICORT) 0.25 MG/2ML nebulizer solution, Take  one ampule in Neb twice daily routinely for COPD  J44.1, Disp: 120 mL, Rfl: 6 .  Cholecalciferol (VITAMIN D3) 2000 units TABS, Take 2,000 Units by mouth daily., Disp: , Rfl:  .  cilostazol (PLETAL) 100 MG tablet, Take 100 mg by mouth daily., Disp: , Rfl:  .  cycloSPORINE (RESTASIS) 0.05 % ophthalmic emulsion, Place 1 drop into both eyes 2 (two) times daily. One drop once a day bilaterally for dry eyes, Disp: , Rfl:  .  dextromethorphan-guaiFENesin (MUCINEX DM) 30-600 MG 12hr tablet, Take 2 tablets by mouth 2 (two) times daily., Disp: 14 tablet, Rfl: 0 .  diclofenac (VOLTAREN) 75 MG EC tablet, TAKE 1 TABLET BY MOUTH 2 TIMES A DAY, Disp: 30 tablet, Rfl: 0 .  diclofenac sodium (VOLTAREN) 1 % GEL, APPLY 4 GRAM TOPICALLY TWICE DAILY as  needed for pain, Disp: 200 g, Rfl: 6 .  DULoxetine (CYMBALTA) 30 MG capsule, TAKE 1 CAPSULE BY MOUTH EVERY DAY TO HELP ANXIETY AND PAINS, Disp: 30 capsule, Rfl: 5 .  EASY TOUCH INSULIN SYRINGE 31G X 5/16" 0.5 ML MISC, USE UP TO 2 TIMES A DAY AS DIRECTED., Disp: 100 each, Rfl: 11 .  furosemide (LASIX) 40 MG tablet, Take 1 tablet by mouth 2 (two) times daily., Disp: , Rfl:  .  gabapentin (NEURONTIN) 100 MG capsule, TAKE 2 CAPSULES BY MOUTH THREE TIMES A DAY, Disp: 180 capsule, Rfl: 5 .  guaifenesin (ROBITUSSIN) 100 MG/5ML syrup, Take 200 mg by mouth 3 (three) times daily as needed for cough., Disp: , Rfl:  .  hydrALAZINE (APRESOLINE) 50 MG tablet, TAKE 1 TABLET BY MOUTH TWICE DAILY, Disp: 60 tablet, Rfl: 3 .  HYDROcodone-acetaminophen (NORCO) 10-325 MG tablet, Take 1 tablet by mouth 4 (four) times daily., Disp: 120 tablet, Rfl: 0 .  insulin NPH-regular Human (NOVOLIN 70/30) (70-30) 100 UNIT/ML injection, Inject 40 units subcutaneous with breakfast and then take 25 units with dinner to control blood sugar., Disp: , Rfl:  .  ipratropium-albuterol (DUONEB) 0.5-2.5 (3) MG/3ML SOLN, Take 57ms by nebulization every 6 hours as needed for SOB/Wheezing, Disp: 360 mL, Rfl: 3 .  metoprolol succinate (TOPROL-XL) 25 MG 24 hr tablet, TAKE 1 TABLET BY MOUTH EVERY DAY WITH OR IMMEDIATELY FOLLOWING A MEAL FOR BLOOD PRESSURE, Disp: 30 tablet, Rfl: 5 .  NEXIUM 40 MG capsule, TAKE 1 CAPSULE BY MOUTH ONCE DAILY, Disp: 30 capsule, Rfl: 5 .  potassium chloride SA (K-DUR,KLOR-CON) 20 MEQ tablet, TAKE 1 TABLET BY MOUTH EVERY DAY, Disp: 30 tablet, Rfl: 3 .  sertraline (ZOLOFT) 50 MG tablet, TAKE 1 TABLET BY MOUTH EVERY DAY., Disp: 30 tablet, Rfl: 3 .  simvastatin (ZOCOR) 20 MG tablet, TAKE 1 TABLET BY MOUTH EVERY DAY, Disp: 30 tablet, Rfl: 5 .  sucralfate (CARAFATE) 1 g tablet, TAKE 1 TABLET BY MOUTH TWICE DAILY ON EMPTY STOMACH., Disp: 60 tablet, Rfl: 3 .  timolol (TIMOPTIC) 0.5 % ophthalmic solution, INSTILL 1 DROP INTO EACH  EYE TWICE A DAY, Disp: 5 mL, Rfl: 4 .  torsemide (DEMADEX) 20 MG tablet, Take 1 tablet by mouth daily for edema., Disp: 90 tablet, Rfl: 2 .  TRAVATAN Z 0.004 % SOLN ophthalmic solution, Place 1 drop into both eyes at bedtime. , Disp: , Rfl:  .  cefUROXime (CEFTIN) 250 MG tablet, Take 1 tablet (250 mg total) by mouth daily. (Patient not taking: Reported on 09/23/2016), Disp: 10 tablet, Rfl: 0 .  fentaNYL (DURAGESIC - DOSED MCG/HR) 25 MCG/HR patch, , Disp: , Rfl:  Review of Systems     Objective:   Physical Exam Vitals:   09/23/16 1156  BP: (!) 144/86  Pulse: 69  SpO2: 93%  Weight: 220 lb (99.8 kg)  Height: '5\' 2"'$  (1.575 m)   Estimated body mass index is 40.24 kg/m as calculated from the following:   Height as of this encounter: '5\' 2"'$  (1.575 m).   Weight as of this encounter: 220 lb (99.8 kg).   Brief focused exam but mostly a discussion visit shows she is obese lady using a walker significant scoliosis. And looks physically deconditioned without edema oriented 3 but poor historian     Assessment:       ICD-9-CM ICD-10-CM   1. Chronic obstructive pulmonary disease, unspecified COPD type ([place]) 496 J44.9   2. OSA (obstructive sleep apnea) 327.23 G47.33   3. Dyspnea and respiratory abnormality 786.09 R06.00     R06.89   4. Other fatigue 780.79 R53.83        Plan:      Her symptoms are really multifactorial in cause. Attribute her to obesity, physical deconditioning, arthritis, scoliosis, hiatal hernia, and possible COPD. Also sleep apnea. It is best she first addressed this is sleep apnea. She then needs to lose weight. In the interim the best for COPD is DuoNeb and Pulmicort therapy. When she gets a sleep apnea addressed after asked her to talk to primary care physician and get some home physical therapy or rehabilitation.. She is best served in consolidating her follow-up with Dr. Elsworth Soho sees with COPD and a more dominant problem of sleep apnea  She verbalized  understanding to the plan  (> 50% of this 15 min visit spent in face to face counseling or/and coordination of care)   Dr. Brand Males, M.D., [facility].C.P Pulmonary and Critical Care Medicine Staff Physician [facility] Pager: [phone], If no answer or between  15:00h - 7:00h: call [phone]  09/23/2016 12:20 PM

## 2016-09-24 ENCOUNTER — Encounter (HOSPITAL_COMMUNITY): Payer: Self-pay | Admitting: Emergency Medicine

## 2016-09-24 ENCOUNTER — Telehealth: Payer: Self-pay

## 2016-09-24 ENCOUNTER — Emergency Department (HOSPITAL_COMMUNITY): Payer: Medicare Other

## 2016-09-24 ENCOUNTER — Other Ambulatory Visit: Payer: Self-pay

## 2016-09-24 ENCOUNTER — Inpatient Hospital Stay (HOSPITAL_COMMUNITY)
Admission: EM | Admit: 2016-09-24 | Discharge: 2016-09-28 | DRG: 291 | Disposition: A | Discharge status: Home Health | Payer: Medicare Other | Attending: Internal Medicine | Admitting: Internal Medicine

## 2016-09-24 DIAGNOSIS — Z885 Allergy status to narcotic agent status: Secondary | ICD-10-CM

## 2016-09-24 DIAGNOSIS — I110 Hypertensive heart disease with heart failure: Secondary | ICD-10-CM | POA: Diagnosis not present

## 2016-09-24 DIAGNOSIS — Z7982 Long term (current) use of aspirin: Secondary | ICD-10-CM

## 2016-09-24 DIAGNOSIS — I509 Heart failure, unspecified: Secondary | ICD-10-CM

## 2016-09-24 DIAGNOSIS — Z823 Family history of stroke: Secondary | ICD-10-CM

## 2016-09-24 DIAGNOSIS — G8929 Other chronic pain: Secondary | ICD-10-CM

## 2016-09-24 DIAGNOSIS — J9610 Chronic respiratory failure, unspecified whether with hypoxia or hypercapnia: Secondary | ICD-10-CM | POA: Diagnosis present

## 2016-09-24 DIAGNOSIS — Z79899 Other long term (current) drug therapy: Secondary | ICD-10-CM

## 2016-09-24 DIAGNOSIS — E1122 Type 2 diabetes mellitus with diabetic chronic kidney disease: Secondary | ICD-10-CM | POA: Diagnosis present

## 2016-09-24 DIAGNOSIS — Z841 Family history of disorders of kidney and ureter: Secondary | ICD-10-CM

## 2016-09-24 DIAGNOSIS — R0789 Other chest pain: Secondary | ICD-10-CM | POA: Diagnosis present

## 2016-09-24 DIAGNOSIS — J441 Chronic obstructive pulmonary disease with (acute) exacerbation: Secondary | ICD-10-CM | POA: Diagnosis not present

## 2016-09-24 DIAGNOSIS — K219 Gastro-esophageal reflux disease without esophagitis: Secondary | ICD-10-CM | POA: Diagnosis present

## 2016-09-24 DIAGNOSIS — M545 Low back pain, unspecified: Secondary | ICD-10-CM

## 2016-09-24 DIAGNOSIS — I5030 Unspecified diastolic (congestive) heart failure: Secondary | ICD-10-CM | POA: Diagnosis present

## 2016-09-24 DIAGNOSIS — I10 Essential (primary) hypertension: Secondary | ICD-10-CM | POA: Diagnosis present

## 2016-09-24 DIAGNOSIS — G629 Polyneuropathy, unspecified: Secondary | ICD-10-CM | POA: Diagnosis present

## 2016-09-24 DIAGNOSIS — N184 Chronic kidney disease, stage 4 (severe): Secondary | ICD-10-CM | POA: Diagnosis present

## 2016-09-24 DIAGNOSIS — R0602 Shortness of breath: Secondary | ICD-10-CM | POA: Diagnosis not present

## 2016-09-24 DIAGNOSIS — Z7951 Long term (current) use of inhaled steroids: Secondary | ICD-10-CM

## 2016-09-24 DIAGNOSIS — M419 Scoliosis, unspecified: Secondary | ICD-10-CM | POA: Diagnosis present

## 2016-09-24 DIAGNOSIS — Z87891 Personal history of nicotine dependence: Secondary | ICD-10-CM

## 2016-09-24 DIAGNOSIS — J449 Chronic obstructive pulmonary disease, unspecified: Secondary | ICD-10-CM | POA: Diagnosis present

## 2016-09-24 DIAGNOSIS — E785 Hyperlipidemia, unspecified: Secondary | ICD-10-CM | POA: Diagnosis present

## 2016-09-24 DIAGNOSIS — E669 Obesity, unspecified: Secondary | ICD-10-CM | POA: Diagnosis present

## 2016-09-24 DIAGNOSIS — I5033 Acute on chronic diastolic (congestive) heart failure: Secondary | ICD-10-CM | POA: Diagnosis present

## 2016-09-24 DIAGNOSIS — E1169 Type 2 diabetes mellitus with other specified complication: Secondary | ICD-10-CM | POA: Diagnosis present

## 2016-09-24 DIAGNOSIS — Z6839 Body mass index (BMI) 39.0-39.9, adult: Secondary | ICD-10-CM

## 2016-09-24 DIAGNOSIS — Z833 Family history of diabetes mellitus: Secondary | ICD-10-CM

## 2016-09-24 DIAGNOSIS — G609 Hereditary and idiopathic neuropathy, unspecified: Secondary | ICD-10-CM | POA: Diagnosis present

## 2016-09-24 DIAGNOSIS — Z886 Allergy status to analgesic agent status: Secondary | ICD-10-CM

## 2016-09-24 DIAGNOSIS — G4733 Obstructive sleep apnea (adult) (pediatric): Secondary | ICD-10-CM | POA: Diagnosis present

## 2016-09-24 DIAGNOSIS — Z96643 Presence of artificial hip joint, bilateral: Secondary | ICD-10-CM | POA: Diagnosis present

## 2016-09-24 DIAGNOSIS — I130 Hypertensive heart and chronic kidney disease with heart failure and stage 1 through stage 4 chronic kidney disease, or unspecified chronic kidney disease: Secondary | ICD-10-CM | POA: Diagnosis not present

## 2016-09-24 DIAGNOSIS — D631 Anemia in chronic kidney disease: Secondary | ICD-10-CM | POA: Diagnosis present

## 2016-09-24 DIAGNOSIS — N189 Chronic kidney disease, unspecified: Secondary | ICD-10-CM

## 2016-09-24 DIAGNOSIS — Z79891 Long term (current) use of opiate analgesic: Secondary | ICD-10-CM

## 2016-09-24 DIAGNOSIS — Z09 Encounter for follow-up examination after completed treatment for conditions other than malignant neoplasm: Secondary | ICD-10-CM

## 2016-09-24 DIAGNOSIS — Z8249 Family history of ischemic heart disease and other diseases of the circulatory system: Secondary | ICD-10-CM

## 2016-09-24 DIAGNOSIS — M170 Bilateral primary osteoarthritis of knee: Secondary | ICD-10-CM | POA: Diagnosis present

## 2016-09-24 DIAGNOSIS — Z794 Long term (current) use of insulin: Secondary | ICD-10-CM

## 2016-09-24 DIAGNOSIS — F419 Anxiety disorder, unspecified: Secondary | ICD-10-CM | POA: Diagnosis present

## 2016-09-24 DIAGNOSIS — Z880 Allergy status to penicillin: Secondary | ICD-10-CM

## 2016-09-24 DIAGNOSIS — I739 Peripheral vascular disease, unspecified: Secondary | ICD-10-CM | POA: Diagnosis present

## 2016-09-24 LAB — BASIC METABOLIC PANEL
Anion gap: 10 (ref 5–15)
BUN: 36 mg/dL — ABNORMAL HIGH (ref 6–20)
CALCIUM: 9.4 mg/dL (ref 8.9–10.3)
CHLORIDE: 106 mmol/L (ref 101–111)
CO2: 25 mmol/L (ref 22–32)
CREATININE: 2.72 mg/dL — AB (ref 0.44–1.00)
GFR calc Af Amer: 18 mL/min — ABNORMAL LOW (ref >60)
GFR calc non Af Amer: 16 mL/min — ABNORMAL LOW (ref >60)
GLUCOSE: 52 mg/dL — AB (ref 65–99)
Potassium: 3.5 mmol/L (ref 3.5–5.1)
Sodium: 141 mmol/L (ref 135–145)

## 2016-09-24 LAB — CBC
HCT: 34.3 % — ABNORMAL LOW (ref 36.0–46.0)
HEMOGLOBIN: 10.8 g/dL — AB (ref 12.0–15.0)
MCH: 25.5 pg — AB (ref 26.0–34.0)
MCHC: 31.5 g/dL (ref 30.0–36.0)
MCV: 81.1 fL (ref 78.0–100.0)
PLATELETS: 282 10*3/uL (ref 150–400)
RBC: 4.23 MIL/uL (ref 3.87–5.11)
RDW: 16.1 % — ABNORMAL HIGH (ref 11.5–15.5)
WBC: 9 10*3/uL (ref 4.0–10.5)

## 2016-09-24 LAB — I-STAT TROPONIN, ED: TROPONIN I, POC: 0.01 ng/mL (ref 0.00–0.08)

## 2016-09-24 LAB — BRAIN NATRIURETIC PEPTIDE: B Natriuretic Peptide: 78.2 pg/mL (ref 0.0–100.0)

## 2016-09-24 LAB — TROPONIN I

## 2016-09-24 LAB — CBG MONITORING, ED: GLUCOSE-CAPILLARY: 106 mg/dL — AB (ref 65–99)

## 2016-09-24 MED ORDER — GLUCOSE 40 % PO GEL: 1.0000 | Freq: Once | ORAL | Status: DC | PRN

## 2016-09-24 MED ORDER — INSULIN NPH ISOPHANE & REGULAR (70-30) 100 UNIT/ML ~~LOC~~ SUSP: SUBCUTANEOUS | 3 refills | Status: DC

## 2016-09-24 MED ORDER — FUROSEMIDE 10 MG/ML IJ SOLN: 40.0000 mg | Freq: Once | INTRAMUSCULAR | Status: DC

## 2016-09-24 NOTE — ED Provider Notes (Signed)
[place] DEPT Provider Note   CSN: [id number] Arrival date & time: 09/24/16  1835  By signing my name below, I, Jeanell Sparrow, attest that this documentation has been prepared under the direction and in the presence of non-physician practitioner, Delos Haring, PA-C. Electronically Signed: Jeanell Sparrow, Scribe. 09/24/2016. 8:29 PM.  History   Chief Complaint No chief complaint on file.  The history is provided by the patient. No language interpreter was used.   HPI Comments: Michelle Buck is a 80 y.o. female with a hx of CHF who presents to the Emergency Department complaining of intermittent moderate chest pain that started 6 days ago. She states she had an X-ray done 6 days ago after seeing her PCP for these symptoms and was told by her PCP today to come to the ED. She reports associated symptoms of SOB (worse with exertion), chest pressure, and LE swelling. She states she is currently on oxygen treatment at home She denies any fever, new cough, vomiting, diarrhea, other myalgias, or other complaints.     PCP: [name], NP  Past Medical History:  Diagnosis Date  . Acute bronchitis   . Anxiety   . Arthritis   . Chronic kidney disease, stage II (mild)   . Complications affecting other specified body systems, hypertension   . Congestive heart failure, unspecified    stress test in March at [facility]  . COPD (chronic obstructive pulmonary disease) ([place])    present for several years, diagnosed in the last few years  . Degenerative arthritis of knee, bilateral   . Diabetes mellitus   . Diaphragmatic hernia without mention of obstruction or gangrene   . Disorder of bone and cartilage, unspecified   . GERD (gastroesophageal reflux disease)   . Hiatal hernia   . Hyperlipidemia   . Hypertension   . Hypopotassemia   . Insomnia, unspecified   . Lumbago   . Obese   . Osteoarthrosis, unspecified whether generalized or localized, unspecified site   . PONV  (postoperative nausea and vomiting)   . Scoliosis   . Secondary diabetes mellitus with renal manifestations, not stated as uncontrolled, or unspecified(249.40)   . Unspecified glaucoma(365.9)   . Unspecified hereditary and idiopathic peripheral neuropathy     Patient Active Problem List   Diagnosis Date Noted  . COPD (chronic obstructive pulmonary disease) ([place]) 09/25/2016  . Dyspnea and respiratory abnormality 09/23/2016  . OSA (obstructive sleep apnea) 09/02/2016  . Other fatigue 06/23/2016  . COPD exacerbation ([place]) 05/21/2016  . PAD (peripheral artery disease) ([place]) 05/21/2016  . Glaucoma 05/21/2016  . Bilateral edema of lower extremity   . Leg edema 01/01/2016  . CKD stage 3 due to type 2 diabetes mellitus ([place])   . Healthcare-associated pneumonia   . CHF (congestive heart failure) ([place]) 10/29/2015  . Chronic obstructive pulmonary disease ([place]) 10/29/2015  . Lumbago   . CKD (chronic kidney disease)   . Cellulitis 09/11/2015  . Hypomagnesemia 09/11/2015  . Chronic kidney disease (CKD), stage IV (severe) ([place]) 09/11/2015  . Diabetes mellitus with diabetic nephropathy, with long-term current use of insulin ([place]) 09/11/2015  . Anemia of chronic kidney failure 09/11/2015  . COPD with acute exacerbation ([place]) 09/11/2015  . Morbid obesity ([place]) 08/14/2015  . Peripheral autonomic neuropathy due to diabetes mellitus ([place]) 10/24/2013  . Essential hypertension, benign 02/27/2013  . Acute on chronic diastolic congestive heart failure ([place]) 03/02/2012  . Diabetes mellitus type 2 in obese ([place]) 03/02/2012    Past Surgical History:  Procedure Laterality Date  . ABDOMINAL HYSTERECTOMY    . COLONOSCOPY  08/06/2011  . FOOT SURGERY    . KNEE ARTHROSCOPY     bilateral  . SHOULDER SURGERY    . TOTAL HIP ARTHROPLASTY     bilateral    OB History    No data available       Home Medications    Prior to Admission medications   Medication Sig Start Date End Date Taking? Authorizing  Provider  AMITIZA 24 MCG capsule TAKE 1 CAPSULE BY MOUTH TWICE DAILY WITH MEALS 08/05/16  Yes [name], NP  aspirin EC 81 MG tablet Take 81 mg by mouth daily.   Yes Historical Provider, MD  Cholecalciferol (VITAMIN D3) 2000 units TABS Take 2,000 Units by mouth daily.   Yes Historical Provider, MD  cilostazol (PLETAL) 100 MG tablet Take 100 mg by mouth daily.   Yes Historical Provider, MD  cycloSPORINE (RESTASIS) 0.05 % ophthalmic emulsion Place 1 drop into both eyes 2 (two) times daily. One drop once a day bilaterally for dry eyes   Yes Historical Provider, MD  diclofenac (VOLTAREN) 75 MG EC tablet TAKE 1 TABLET BY MOUTH 2 TIMES A DAY 09/02/16  Yes Titorya Stover, DPM  DULoxetine (CYMBALTA) 30 MG capsule TAKE 1 CAPSULE BY MOUTH EVERY DAY TO HELP ANXIETY AND PAINS 04/15/16  Yes [name], NP  EASY TOUCH INSULIN SYRINGE 31G X 5/16" 0.5 ML MISC USE UP TO 2 TIMES A DAY AS DIRECTED. 08/05/16  Yes [name], NP  furosemide (LASIX) 40 MG tablet Take 1 tablet by mouth 2 (two) times daily. 08/12/16  Yes Historical Provider, MD  gabapentin (NEURONTIN) 100 MG capsule TAKE 2 CAPSULES BY MOUTH THREE TIMES A DAY 08/05/16  Yes [name], NP  hydrALAZINE (APRESOLINE) 50 MG tablet TAKE 1 TABLET BY MOUTH TWICE DAILY 07/08/16  Yes [name], NP  HYDROcodone-acetaminophen (NORCO) 10-325 MG tablet Take 1 tablet by mouth 4 (four) times daily. 09/18/16  Yes Gildardo Cranker, DO  insulin NPH-regular Human (NOVOLIN 70/30) (70-30) 100 UNIT/ML injection Inject 40 units subcutaneous with breakfast and then take 25 units with dinner to control blood sugar. 09/24/16  Yes Tivis Ringer, RPH-CPP  ipratropium-albuterol (DUONEB) 0.5-2.5 (3) MG/3ML SOLN Take 13mls by nebulization every 6 hours as needed for SOB/Wheezing 01/09/16  Yes [name], NP  linaclotide (LINZESS) 145 MCG CAPS capsule Take 145 mcg by mouth 2 (two) times daily.   Yes Historical Provider, MD  metoprolol succinate (TOPROL-XL) 25 MG  24 hr tablet TAKE 1 TABLET BY MOUTH EVERY DAY WITH OR IMMEDIATELY FOLLOWING A MEAL FOR BLOOD PRESSURE 04/15/16  Yes [name], NP  NEXIUM 40 MG capsule TAKE 1 CAPSULE BY MOUTH ONCE DAILY 04/15/16  Yes [name], NP  potassium chloride SA (K-DUR,KLOR-CON) 20 MEQ tablet TAKE 1 TABLET BY MOUTH EVERY DAY 07/08/16  Yes [name], NP  sertraline (ZOLOFT) 50 MG tablet TAKE 1 TABLET BY MOUTH EVERY DAY. 07/08/16  Yes [name], NP  simvastatin (ZOCOR) 20 MG tablet TAKE 1 TABLET BY MOUTH EVERY DAY 04/15/16  Yes [name], NP  sucralfate (CARAFATE) 1 g tablet TAKE 1 TABLET BY MOUTH TWICE DAILY ON EMPTY STOMACH. 06/10/16  Yes [name], NP  timolol (TIMOPTIC) 0.5 % ophthalmic solution INSTILL 1 DROP INTO EACH EYE TWICE A DAY 02/12/16  Yes [name], NP  torsemide (DEMADEX) 20 MG tablet Take 1 tablet by mouth  daily for edema. 01/22/16  Yes [name], NP  TRAVATAN Z 0.004 % SOLN ophthalmic solution Place 1 drop into both eyes at bedtime.  04/09/16  Yes Historical Provider, MD  albuterol (PROVENTIL HFA;VENTOLIN HFA) 108 (90 Base) MCG/ACT inhaler Inhale 2 puffs into the lungs every 4 (four) hours as needed for wheezing or shortness of breath.    Historical Provider, MD  budesonide (PULMICORT) 0.25 MG/2ML nebulizer solution Take one ampule in Neb twice daily routinely for COPD  J44.1 05/29/16   Gildardo Cranker, DO  cefUROXime (CEFTIN) 250 MG tablet Take 1 tablet (250 mg total) by mouth daily. Patient not taking: Reported on 09/24/2016 09/18/16   Gildardo Cranker, DO  cloNIDine (CATAPRES) 0.1 MG tablet  09/23/16   Historical Provider, MD  dextromethorphan-guaiFENesin (MUCINEX DM) 30-600 MG 12hr tablet Take 2 tablets by mouth 2 (two) times daily. Patient not taking: Reported on 09/24/2016 05/25/16   Sid Falcon, MD  diclofenac sodium (VOLTAREN) 1 % GEL APPLY 4 GRAM TOPICALLY TWICE DAILY as needed for pain 06/08/16   [name], NP  enalapril (VASOTEC) 10 MG tablet   09/23/16   Historical Provider, MD  fentaNYL (DURAGESIC - DOSED MCG/HR) 25 MCG/HR patch  08/12/16   Historical Provider, MD  guaifenesin (ROBITUSSIN) 100 MG/5ML syrup Take 200 mg by mouth 3 (three) times daily as needed for cough.    Historical Provider, MD  polyethylene glycol Merril Abbe / GLYCOLAX) packet  09/23/16   Historical Provider, MD    Family History Family History  Problem Relation Age of Onset  . Diabetes Mother   . Heart disease Mother   . Heart disease Brother   . Diabetes Brother   . Kidney disease Father   . Scoliosis Sister   . Diabetes Brother   . Diabetes Brother   . Heart disease Brother   . Scoliosis Brother   . Stroke Brother   . Heart attack Brother     Social History Social History  Substance Use Topics  . Smoking status: Former Smoker    Packs/day: 0.50    Years: 3.00    Types: Cigarettes    Quit date: 02/27/1997  . Smokeless tobacco: Never Used     Comment: remote history - stopped 19 years ago; 20 pack year history  . Alcohol use No     Allergies   Tramadol; Codeine; Hydrocodone; Oxycodone; Tylenol [acetaminophen]; and Penicillins   Review of Systems Review of Systems A complete 10 system review of systems was obtained and all systems are negative except as noted in the HPI and PMH.   Physical Exam Updated Vital Signs BP 149/84 (BP Location: Right Arm)   Pulse 61   Temp 98 F (36.7 C) (Oral)   Resp 17   SpO2 95%   Physical Exam  Constitutional: She appears well-developed and well-nourished. No distress.  HENT:  Head: Normocephalic.  Eyes: Conjunctivae are normal.  Neck: Neck supple.  Cardiovascular: Normal rate and regular rhythm.   Pulmonary/Chest: Effort normal. No respiratory distress. She has rales.  Pt on 2 L [place]  Abdominal: Soft.  Musculoskeletal: Normal range of motion.  LE swelling present to bilateral extremities.  Neurological: She is alert.  Skin: Skin is warm and dry.  Psychiatric: She has a normal mood and affect.   Nursing note and vitals reviewed.    ED Treatments / Results  DIAGNOSTIC STUDIES: Oxygen Saturation is 95% on RA, normal by my interpretation.    COORDINATION OF CARE: 8:33 PM- Pt  advised of plan for treatment and pt agrees.  Labs (all labs ordered are listed, but only abnormal results are displayed) Labs Reviewed  BASIC METABOLIC PANEL - Abnormal; Notable for the following:       Result Value   Glucose, Bld 52 (*)    BUN 36 (*)    Creatinine, Ser 2.72 (*)    GFR calc non Af Amer 16 (*)    GFR calc Af Amer 18 (*)    All other components within normal limits  CBC - Abnormal; Notable for the following:    Hemoglobin 10.8 (*)    HCT 34.3 (*)    MCH 25.5 (*)    RDW 16.1 (*)    All other components within normal limits  CBG MONITORING, ED - Abnormal; Notable for the following:    Glucose-Capillary 106 (*)    All other components within normal limits  BRAIN NATRIURETIC PEPTIDE  TROPONIN I  I-STAT TROPOININ, ED  CBG MONITORING, ED    EKG  EKG Interpretation  Date/Time:  Thursday September 24 2016 21:01:33 EDT Ventricular Rate:  60 PR Interval:    QRS Duration: 87 QT Interval:  406 QTC Calculation: 406 R Axis:   72 Text Interpretation:  Sinus rhythm Confirmed by Jeneen Rinks  MD, [place] ([id number]) on 09/25/2016 12:15:24 AM       Radiology Dg Chest 2 View  Result Date: 09/24/2016 CLINICAL DATA:  Increase chest pressure EXAM: CHEST  2 VIEW COMPARISON:  September 14, 2016 FINDINGS: The mediastinal contour is normal. The heart size is mildly enlarged. The aorta is tortuous. There is mild central pulmonary vascular congestion. There is no focal pneumonia. There is a minimal left pleural effusion. The visualized skeletal structures are stable. IMPRESSION: Cardiomegaly with mild central pulmonary vascular congestion. Electronically Signed   By: Abelardo Diesel M.D.   On: 09/24/2016 19:38    Procedures Procedures (including critical care time)  Medications Ordered in ED Medications    albuterol (PROVENTIL) (2.5 MG/3ML) 0.083% nebulizer solution 5 mg (not administered)  ipratropium (ATROVENT) nebulizer solution 0.5 mg (not administered)  furosemide (LASIX) injection 40 mg (not administered)     Initial Impression / Assessment and Plan / ED Course  I have reviewed the triage vital signs and the nursing notes.  Pertinent labs & imaging results that were available during my care of the patient were reviewed by me and considered in my medical decision making (see chart for details).  Clinical Course    Patient appears to be fluid overload on chest xray and by PE. Will order IV of home dose the 40mg  IV Lasix as well as an albuterol/atrovent. Dr. Jeneen Rinks, M saw patient as well and feels that she would benefit from admission and close maintenance.  Dr. Tamala Julian, R.  Has agreed to admit patient to inpatient Tele, [facility] admits, [facility] hospitalist.  CRITICAL CARE Performed by: Linus Mako Total critical care time: 35 minutes Critical care time was exclusive of separately billable procedures and treating other patients. Critical care was necessary to treat or prevent imminent or life-threatening deterioration. Critical care was time spent personally by me on the following activities: development of treatment plan with patient and/or surrogate as well as nursing, discussions with consultants, evaluation of patient's response to treatment, examination of patient, obtaining history from patient or surrogate, ordering and performing treatments and interventions, ordering and review of laboratory studies, ordering and review of radiographic studies, pulse oximetry and re-evaluation of patient's condition.   Final Clinical Impressions(s) / ED  Diagnoses   Final diagnoses:  Congestive heart failure, unspecified congestive heart failure chronicity, unspecified congestive heart failure type ([place])  COPD exacerbation ([facility])    New Prescriptions New Prescriptions   No medications on file    I personally performed the services described in this documentation, which was scribed in my presence. The recorded information has been reviewed and is accurate.     Delos Haring, PA-C 09/25/16 [id number]    Tanna Furry, MD 10/05/16 2021

## 2016-09-24 NOTE — Progress Notes (Signed)
pft  Dr. Brand Males, M.D., [facility].C.P Pulmonary and Critical Care Medicine Staff Physician [facility] Pager: [phone], If no answer or between  15:00h - 7:00h: call [phone]  09/24/2016 9:05 AM

## 2016-09-24 NOTE — ED Notes (Signed)
Pt's sister, Stoney Bang, cell # [phone].

## 2016-09-24 NOTE — ED Triage Notes (Signed)
Pt had an x-ray done on Friday and the PCP called her today and let her know they saw fluid build up around her heart and referred her to the ER.  Patient said she feels chest pressure and feeling "woozy". Normally wears 2 L of oxygen at home.

## 2016-09-24 NOTE — Telephone Encounter (Signed)
A representative from [facility] called to ask to inform the office that the quantity (10 ml) of Novolin 70/30 that was ordered by Michelle Buck would only last the patient 15 days if using the current dosing directions. The pharmacy asked to increase the quantity to 20 ml so that the patient would have a 30 day supply.   Quantity was changed to 20 ml.

## 2016-09-24 NOTE — ED Notes (Signed)
IV attempt x2 without success.

## 2016-09-24 NOTE — ED Notes (Signed)
Attempt of PIV x1 unsuccessful. Primary RN made aware. 3rd RN to attempt as well.

## 2016-09-25 ENCOUNTER — Inpatient Hospital Stay (HOSPITAL_COMMUNITY): Payer: Medicare Other

## 2016-09-25 DIAGNOSIS — I739 Peripheral vascular disease, unspecified: Secondary | ICD-10-CM | POA: Diagnosis present

## 2016-09-25 DIAGNOSIS — R0789 Other chest pain: Secondary | ICD-10-CM | POA: Diagnosis not present

## 2016-09-25 DIAGNOSIS — E1122 Type 2 diabetes mellitus with diabetic chronic kidney disease: Secondary | ICD-10-CM | POA: Diagnosis present

## 2016-09-25 DIAGNOSIS — I10 Essential (primary) hypertension: Secondary | ICD-10-CM | POA: Diagnosis not present

## 2016-09-25 DIAGNOSIS — G609 Hereditary and idiopathic neuropathy, unspecified: Secondary | ICD-10-CM | POA: Diagnosis present

## 2016-09-25 DIAGNOSIS — M419 Scoliosis, unspecified: Secondary | ICD-10-CM | POA: Diagnosis present

## 2016-09-25 DIAGNOSIS — Z8249 Family history of ischemic heart disease and other diseases of the circulatory system: Secondary | ICD-10-CM | POA: Diagnosis not present

## 2016-09-25 DIAGNOSIS — J441 Chronic obstructive pulmonary disease with (acute) exacerbation: Secondary | ICD-10-CM | POA: Diagnosis present

## 2016-09-25 DIAGNOSIS — J449 Chronic obstructive pulmonary disease, unspecified: Secondary | ICD-10-CM | POA: Diagnosis present

## 2016-09-25 DIAGNOSIS — I509 Heart failure, unspecified: Secondary | ICD-10-CM

## 2016-09-25 DIAGNOSIS — E1169 Type 2 diabetes mellitus with other specified complication: Secondary | ICD-10-CM | POA: Diagnosis not present

## 2016-09-25 DIAGNOSIS — Z96643 Presence of artificial hip joint, bilateral: Secondary | ICD-10-CM | POA: Diagnosis present

## 2016-09-25 DIAGNOSIS — N183 Chronic kidney disease, stage 3 (moderate): Secondary | ICD-10-CM | POA: Diagnosis not present

## 2016-09-25 DIAGNOSIS — G8929 Other chronic pain: Secondary | ICD-10-CM | POA: Diagnosis present

## 2016-09-25 DIAGNOSIS — J9610 Chronic respiratory failure, unspecified whether with hypoxia or hypercapnia: Secondary | ICD-10-CM | POA: Diagnosis present

## 2016-09-25 DIAGNOSIS — Z87891 Personal history of nicotine dependence: Secondary | ICD-10-CM | POA: Diagnosis not present

## 2016-09-25 DIAGNOSIS — F419 Anxiety disorder, unspecified: Secondary | ICD-10-CM | POA: Diagnosis present

## 2016-09-25 DIAGNOSIS — M170 Bilateral primary osteoarthritis of knee: Secondary | ICD-10-CM | POA: Diagnosis present

## 2016-09-25 DIAGNOSIS — K219 Gastro-esophageal reflux disease without esophagitis: Secondary | ICD-10-CM | POA: Diagnosis present

## 2016-09-25 DIAGNOSIS — Z823 Family history of stroke: Secondary | ICD-10-CM | POA: Diagnosis not present

## 2016-09-25 DIAGNOSIS — R0602 Shortness of breath: Secondary | ICD-10-CM | POA: Diagnosis not present

## 2016-09-25 DIAGNOSIS — I5033 Acute on chronic diastolic (congestive) heart failure: Secondary | ICD-10-CM | POA: Diagnosis not present

## 2016-09-25 DIAGNOSIS — E785 Hyperlipidemia, unspecified: Secondary | ICD-10-CM | POA: Diagnosis present

## 2016-09-25 DIAGNOSIS — Z833 Family history of diabetes mellitus: Secondary | ICD-10-CM | POA: Diagnosis not present

## 2016-09-25 DIAGNOSIS — G629 Polyneuropathy, unspecified: Secondary | ICD-10-CM | POA: Diagnosis present

## 2016-09-25 DIAGNOSIS — I130 Hypertensive heart and chronic kidney disease with heart failure and stage 1 through stage 4 chronic kidney disease, or unspecified chronic kidney disease: Secondary | ICD-10-CM | POA: Diagnosis present

## 2016-09-25 DIAGNOSIS — I5030 Unspecified diastolic (congestive) heart failure: Secondary | ICD-10-CM | POA: Diagnosis present

## 2016-09-25 DIAGNOSIS — N184 Chronic kidney disease, stage 4 (severe): Secondary | ICD-10-CM | POA: Diagnosis present

## 2016-09-25 DIAGNOSIS — G4733 Obstructive sleep apnea (adult) (pediatric): Secondary | ICD-10-CM | POA: Diagnosis not present

## 2016-09-25 DIAGNOSIS — E669 Obesity, unspecified: Secondary | ICD-10-CM | POA: Diagnosis present

## 2016-09-25 DIAGNOSIS — Z6839 Body mass index (BMI) 39.0-39.9, adult: Secondary | ICD-10-CM | POA: Diagnosis not present

## 2016-09-25 DIAGNOSIS — D631 Anemia in chronic kidney disease: Secondary | ICD-10-CM | POA: Diagnosis present

## 2016-09-25 LAB — CBC WITH DIFFERENTIAL/PLATELET
Basophils Absolute: 0 10*3/uL (ref 0.0–0.1)
Basophils Relative: 0 %
EOS ABS: 0.5 10*3/uL (ref 0.0–0.7)
Eosinophils Relative: 7 %
HEMATOCRIT: 31.9 % — AB (ref 36.0–46.0)
HEMOGLOBIN: 10.2 g/dL — AB (ref 12.0–15.0)
LYMPHS ABS: 2.2 10*3/uL (ref 0.7–4.0)
Lymphocytes Relative: 33 %
MCH: 25.8 pg — AB (ref 26.0–34.0)
MCHC: 32 g/dL (ref 30.0–36.0)
MCV: 80.8 fL (ref 78.0–100.0)
MONOS PCT: 8 %
Monocytes Absolute: 0.6 10*3/uL (ref 0.1–1.0)
NEUTROS PCT: 52 %
Neutro Abs: 3.4 10*3/uL (ref 1.7–7.7)
Platelets: 234 10*3/uL (ref 150–400)
RBC: 3.95 MIL/uL (ref 3.87–5.11)
RDW: 16 % — ABNORMAL HIGH (ref 11.5–15.5)
WBC: 6.7 10*3/uL (ref 4.0–10.5)

## 2016-09-25 LAB — GLUCOSE, CAPILLARY
GLUCOSE-CAPILLARY: 175 mg/dL — AB (ref 65–99)
GLUCOSE-CAPILLARY: 137 mg/dL — AB (ref 65–99)
Glucose-Capillary: 89 mg/dL (ref 65–99)
Glucose-Capillary: 165 mg/dL — ABNORMAL HIGH (ref 65–99)
Glucose-Capillary: 87 mg/dL (ref 65–99)

## 2016-09-25 LAB — TROPONIN I: Troponin I: <0.03 ng/mL (ref <0.03)

## 2016-09-25 LAB — ECHOCARDIOGRAM COMPLETE
Height: 62 in
WEIGHTICAEL: 3485.03 [oz_av]

## 2016-09-25 MED ORDER — BUDESONIDE 0.5 MG/2ML IN SUSP
0.5000 mg | Freq: Two times a day (BID) | RESPIRATORY_TRACT | Status: DC
  Administered 2016-09-25 – 2016-09-28 (×7): 0.5 mg via RESPIRATORY_TRACT
  Filled 2016-09-25 (×7): qty 2

## 2016-09-25 MED ORDER — CYCLOSPORINE 0.05 % OP EMUL
1.0000 [drp] | Freq: Every day | OPHTHALMIC | Status: DC
  Administered 2016-09-25 – 2016-09-28 (×4): 1 [drp] via OPHTHALMIC
  Filled 2016-09-25 (×4): qty 1

## 2016-09-25 MED ORDER — ASPIRIN EC 81 MG PO TBEC
81.0000 mg | DELAYED_RELEASE_TABLET | Freq: Every day | ORAL | Status: DC
  Administered 2016-09-25 – 2016-09-28 (×4): 81 mg via ORAL
  Filled 2016-09-25 (×4): qty 1

## 2016-09-25 MED ORDER — POTASSIUM CHLORIDE CRYS ER 20 MEQ PO TBCR
20.0000 meq | EXTENDED_RELEASE_TABLET | Freq: Every day | ORAL | Status: DC
  Administered 2016-09-25 – 2016-09-28 (×4): 20 meq via ORAL
  Filled 2016-09-25 (×4): qty 1

## 2016-09-25 MED ORDER — LATANOPROST 0.005 % OP SOLN
1.0000 [drp] | Freq: Every day | OPHTHALMIC | Status: DC
  Administered 2016-09-25 – 2016-09-27 (×4): 1 [drp] via OPHTHALMIC
  Filled 2016-09-25: qty 2.5

## 2016-09-25 MED ORDER — SORBITOL 70 % SOLN
960.0000 mL | TOPICAL_OIL | Freq: Once | ORAL | Status: DC
  Filled 2016-09-25: qty 240

## 2016-09-25 MED ORDER — IPRATROPIUM BROMIDE 0.02 % IN SOLN
0.5000 mg | Freq: Once | RESPIRATORY_TRACT | Status: AC
  Administered 2016-09-25: 0.5 mg via RESPIRATORY_TRACT
  Filled 2016-09-25: qty 2.5

## 2016-09-25 MED ORDER — SODIUM CHLORIDE 0.9 % IV SOLN
250.0000 mL | INTRAVENOUS | Status: DC | PRN
  Administered 2016-09-25: 250 mL via INTRAVENOUS

## 2016-09-25 MED ORDER — HYDRALAZINE HCL 50 MG PO TABS
50.0000 mg | ORAL_TABLET | Freq: Two times a day (BID) | ORAL | Status: DC
  Administered 2016-09-25 – 2016-09-28 (×7): 50 mg via ORAL
  Filled 2016-09-25 (×7): qty 1

## 2016-09-25 MED ORDER — TIMOLOL MALEATE 0.5 % OP SOLN
1.0000 [drp] | Freq: Two times a day (BID) | OPHTHALMIC | Status: DC
  Administered 2016-09-25 – 2016-09-28 (×7): 1 [drp] via OPHTHALMIC
  Filled 2016-09-25: qty 5

## 2016-09-25 MED ORDER — METOPROLOL SUCCINATE ER 25 MG PO TB24
25.0000 mg | ORAL_TABLET | Freq: Every day | ORAL | Status: DC
  Administered 2016-09-25 – 2016-09-28 (×4): 25 mg via ORAL
  Filled 2016-09-25 (×4): qty 1

## 2016-09-25 MED ORDER — LINACLOTIDE 145 MCG PO CAPS
145.0000 ug | ORAL_CAPSULE | Freq: Two times a day (BID) | ORAL | Status: DC
  Administered 2016-09-25 – 2016-09-28 (×7): 145 ug via ORAL
  Filled 2016-09-25 (×7): qty 1

## 2016-09-25 MED ORDER — FUROSEMIDE 10 MG/ML IJ SOLN
20.0000 mg | Freq: Two times a day (BID) | INTRAMUSCULAR | Status: DC
Start: 2016-09-25 — End: 2016-09-27
  Administered 2016-09-25 – 2016-09-27 (×4): 20 mg via INTRAVENOUS
  Filled 2016-09-25 (×4): qty 2

## 2016-09-25 MED ORDER — PANTOPRAZOLE SODIUM 40 MG PO TBEC
40.0000 mg | DELAYED_RELEASE_TABLET | Freq: Every day | ORAL | Status: DC
  Administered 2016-09-25 – 2016-09-28 (×4): 40 mg via ORAL
  Filled 2016-09-25 (×4): qty 1

## 2016-09-25 MED ORDER — HYDROCODONE-ACETAMINOPHEN 10-325 MG PO TABS
1.0000 | ORAL_TABLET | Freq: Four times a day (QID) | ORAL | Status: DC
  Administered 2016-09-25 – 2016-09-28 (×13): 1 via ORAL
  Filled 2016-09-25 (×15): qty 1

## 2016-09-25 MED ORDER — DULOXETINE HCL 30 MG PO CPEP
30.0000 mg | ORAL_CAPSULE | Freq: Every day | ORAL | Status: DC
  Administered 2016-09-25 – 2016-09-28 (×4): 30 mg via ORAL
  Filled 2016-09-25 (×4): qty 1

## 2016-09-25 MED ORDER — FUROSEMIDE 10 MG/ML IJ SOLN
40.0000 mg | Freq: Once | INTRAMUSCULAR | Status: AC
  Administered 2016-09-25: 40 mg via INTRAVENOUS
  Filled 2016-09-25: qty 4

## 2016-09-25 MED ORDER — SERTRALINE HCL 50 MG PO TABS
50.0000 mg | ORAL_TABLET | Freq: Every day | ORAL | Status: DC
  Administered 2016-09-25 – 2016-09-28 (×3): 50 mg via ORAL
  Filled 2016-09-25 (×4): qty 1

## 2016-09-25 MED ORDER — CILOSTAZOL 100 MG PO TABS
100.0000 mg | ORAL_TABLET | Freq: Every day | ORAL | Status: DC
  Administered 2016-09-25 – 2016-09-28 (×4): 100 mg via ORAL
  Filled 2016-09-25: qty 1
  Filled 2016-09-25 (×3): qty 2
  Filled 2016-09-25: qty 1

## 2016-09-25 MED ORDER — IPRATROPIUM-ALBUTEROL 0.5-2.5 (3) MG/3ML IN SOLN
3.0000 mL | RESPIRATORY_TRACT | Status: DC | PRN
  Administered 2016-09-26: 3 mL via RESPIRATORY_TRACT
  Filled 2016-09-25: qty 3

## 2016-09-25 MED ORDER — GUAIFENESIN 100 MG/5ML PO SOLN
200.0000 mg | Freq: Three times a day (TID) | ORAL | Status: DC | PRN
  Administered 2016-09-25 – 2016-09-27 (×3): 200 mg via ORAL
  Filled 2016-09-25 (×3): qty 10

## 2016-09-25 MED ORDER — INSULIN ASPART 100 UNIT/ML ~~LOC~~ SOLN
0.0000 [IU] | Freq: Three times a day (TID) | SUBCUTANEOUS | Status: DC
  Administered 2016-09-26: 2 [IU] via SUBCUTANEOUS
  Administered 2016-09-27: 1 [IU] via SUBCUTANEOUS
  Administered 2016-09-27 – 2016-09-28 (×2): 2 [IU] via SUBCUTANEOUS

## 2016-09-25 MED ORDER — INSULIN ASPART 100 UNIT/ML ~~LOC~~ SOLN
0.0000 [IU] | SUBCUTANEOUS | Status: DC
  Administered 2016-09-25 (×2): 2 [IU] via SUBCUTANEOUS

## 2016-09-25 MED ORDER — ONDANSETRON HCL 4 MG/2ML IJ SOLN
4.0000 mg | Freq: Four times a day (QID) | INTRAMUSCULAR | Status: DC | PRN
  Administered 2016-09-27: 4 mg via INTRAVENOUS
  Filled 2016-09-25: qty 2

## 2016-09-25 MED ORDER — SUCRALFATE 1 G PO TABS
1.0000 g | ORAL_TABLET | Freq: Two times a day (BID) | ORAL | Status: DC
  Administered 2016-09-25 – 2016-09-28 (×7): 1 g via ORAL
  Filled 2016-09-25 (×7): qty 1

## 2016-09-25 MED ORDER — ALBUTEROL SULFATE (2.5 MG/3ML) 0.083% IN NEBU
5.0000 mg | INHALATION_SOLUTION | Freq: Once | RESPIRATORY_TRACT | Status: AC
  Administered 2016-09-25: 5 mg via RESPIRATORY_TRACT
  Filled 2016-09-25: qty 6

## 2016-09-25 MED ORDER — ARFORMOTEROL TARTRATE 15 MCG/2ML IN NEBU
15.0000 ug | INHALATION_SOLUTION | Freq: Two times a day (BID) | RESPIRATORY_TRACT | Status: DC
  Administered 2016-09-25 – 2016-09-28 (×7): 15 ug via RESPIRATORY_TRACT
  Filled 2016-09-25 (×7): qty 2

## 2016-09-25 MED ORDER — SODIUM CHLORIDE 0.9% FLUSH
3.0000 mL | Freq: Two times a day (BID) | INTRAVENOUS | Status: DC
  Administered 2016-09-25 – 2016-09-28 (×8): 3 mL via INTRAVENOUS

## 2016-09-25 MED ORDER — SODIUM CHLORIDE 0.9% FLUSH: 3.0000 mL | INTRAVENOUS | Status: DC | PRN

## 2016-09-25 MED ORDER — HEPARIN SODIUM (PORCINE) 5000 UNIT/ML IJ SOLN
5000.0000 [IU] | Freq: Three times a day (TID) | INTRAMUSCULAR | Status: DC
  Administered 2016-09-25 – 2016-09-28 (×10): 5000 [IU] via SUBCUTANEOUS
  Filled 2016-09-25 (×10): qty 1

## 2016-09-25 MED ORDER — HYDROCODONE-ACETAMINOPHEN 10-325 MG PO TABS
1.0000 | ORAL_TABLET | Freq: Four times a day (QID) | ORAL | Status: DC | PRN
  Administered 2016-09-25 – 2016-09-28 (×6): 1 via ORAL
  Filled 2016-09-25 (×5): qty 1

## 2016-09-25 MED ORDER — SIMVASTATIN 20 MG PO TABS
20.0000 mg | ORAL_TABLET | Freq: Every day | ORAL | Status: DC
  Administered 2016-09-25 – 2016-09-28 (×4): 20 mg via ORAL
  Filled 2016-09-25 (×4): qty 1

## 2016-09-25 NOTE — Progress Notes (Signed)
  Echocardiogram 2D Echocardiogram has been performed.  Michelle Buck 09/25/2016, 10:08 AM

## 2016-09-25 NOTE — ED Notes (Signed)
Dr. Tamala Julian, Hospitalist, in room with pt.

## 2016-09-25 NOTE — Progress Notes (Signed)
Michelle Buck is a 80 y.o. female with medical history significant of COPD, chronic respiratory failure , diastolic CHF last EF 65- 19%, DM type II CKD stage IV, anxiety, OSA on CPAP; who presents with complaints of progressively worsening shortness of breath and chest pressure over the last 3-4 weeks, with worsening orthopnea and pedal edema. On arrival to ED she was found to be have pulmonary vascular congestion. shewas admitted for management of acute CHF exacerbation. She was started on IV lasix and a repeat echocardiogram ordered.  She also reports that she does not use oxygen during daytime and uses it only during night time about 1 to 2 liter/min.  She was admitted earlier this am and please see Dr Thompson Caul note/ H&P in detail.   Plan: 1. Started her on IV lasix 20 mg BID, and watch creatinine .  2. Echocardiogrm.  3. Will request for cardiology consult in am.  4. Will need nephrology consult in am,  5. Resume duonebs and bronchodilators.    Monitor, Hosie Poisson, MD [phone]

## 2016-09-25 NOTE — H&P (Signed)
History and Physical    Michelle Buck VEH:[id number] DOB: 01-07-1936 DOA: 09/24/2016  Referring MD/NP/PA: Hermina Barters PA-C PCP: [name], NP  Patient coming from: Home  Chief Complaint: shortness of breath and chest pressure  HPI: Michelle Buck is a 80 y.o. female with medical history significant of COPD, chronic respiratory failure on 2 L 83/6, diastolic CHF last EF 65- 62%, DM type II CKD stage IV, anxiety, OSA on CPAP; who presents with complaints of progressively worsening shortness of breath and chest pressure over the last 3-4 weeks. Chest pressure is reported be central located. Associated symptoms include progressively worsening leg swelling, dyspnea on exertion, and orthopnea. Patient states that she has a chronic productive cough has not changed, and sputum production that has not changed in color or consistency. Also noting chronic active pain and peripheral neuropathy. She has noted easily getting winded with any physical activity. Patient reports taking all of her medications as prescribed and checks her weight daily and she has been up and down, but has been around 220 pounds most recently. She tried using her home nebulizer treatments without relief of symptoms. Patient was evaluated by her PCP  who obtain a chest x-ray and referred her to come into the emergency department for further evaluation as there appeared to be fluid on her lungs. Patient notes that she was recently diagnosed with obstructive sleep apnea and going to be set up with CPAP machine here in the coming week. She is followed by Dr. Chase Caller of pulmonary. Denies any active smoking history( quit 20 years ago), falls, loss of consciousness, vomiting, diarrhea, or fever.  ED Course:  Upon admission patient was seen to be afebrile, respirations 12-30, O2 saturation maintained on home 2 L oxygen, and all other vital signs relatively within normal limits. Laboratory revealed hemoglobin 10.8, BUN 36, creatinine 2.72,   a glucose 52, BNP 78.2, troponin 2 negative, and all other lab work was relatively within normal limits.. Chest x-ray showed mild cardiomegaly with mild central pulmonary vascular congestion.  Review of Systems: As per HPI otherwise 10 point review of systems negative.   Past Medical History:  Diagnosis Date  . Acute bronchitis   . Anxiety   . Arthritis   . Chronic kidney disease, stage II (mild)   . Complications affecting other specified body systems, hypertension   . Congestive heart failure, unspecified    stress test in March at [facility]  . COPD (chronic obstructive pulmonary disease) ([place])    present for several years, diagnosed in the last few years  . Degenerative arthritis of knee, bilateral   . Diabetes mellitus   . Diaphragmatic hernia without mention of obstruction or gangrene   . Disorder of bone and cartilage, unspecified   . GERD (gastroesophageal reflux disease)   . Hiatal hernia   . Hyperlipidemia   . Hypertension   . Hypopotassemia   . Insomnia, unspecified   . Lumbago   . Obese   . Osteoarthrosis, unspecified whether generalized or localized, unspecified site   . PONV (postoperative nausea and vomiting)   . Scoliosis   . Secondary diabetes mellitus with renal manifestations, not stated as uncontrolled, or unspecified(249.40)   . Unspecified glaucoma(365.9)   . Unspecified hereditary and idiopathic peripheral neuropathy     Past Surgical History:  Procedure Laterality Date  . ABDOMINAL HYSTERECTOMY    . COLONOSCOPY  08/06/2011  . FOOT SURGERY    . KNEE ARTHROSCOPY     bilateral  . SHOULDER SURGERY    .  TOTAL HIP ARTHROPLASTY     bilateral     reports that she quit smoking about 19 years ago. Her smoking use included Cigarettes. She has a 1.50 pack-year smoking history. She has never used smokeless tobacco. She reports that she does not drink alcohol or use drugs.  Allergies  Allergen Reactions  . Tramadol Other (See Comments)    Leg  cramps   . Codeine Nausea And Vomiting    Patient states N/V with codeine  . Hydrocodone Nausea And Vomiting  . Oxycodone Other (See Comments)    Patient states she can tolerate oxycodone  . Tylenol [Acetaminophen] Hives  . Penicillins Rash    Patient states rash/itch with penicillin Has patient had a PCN reaction causing immediate rash, facial/tongue/throat swelling, SOB or lightheadedness with hypotension: Yes- broke me out and i was itching Has patient had a PCN reaction causing severe rash involving mucus membranes or skin necrosis: No Has patient had a PCN reaction that required hospitalization Yes- i was already in the hospital Has patient had a PCN reaction occurring within the last 10 years: No If all of the above answers are "NO    Family History  Problem Relation Age of Onset  . Diabetes Mother   . Heart disease Mother   . Heart disease Brother   . Diabetes Brother   . Kidney disease Father   . Scoliosis Sister   . Diabetes Brother   . Diabetes Brother   . Heart disease Brother   . Scoliosis Brother   . Stroke Brother   . Heart attack Brother     Prior to Admission medications   Medication Sig Start Date End Date Taking? Authorizing Provider  AMITIZA 24 MCG capsule TAKE 1 CAPSULE BY MOUTH TWICE DAILY WITH MEALS 08/05/16  Yes [name], NP  aspirin EC 81 MG tablet Take 81 mg by mouth daily.   Yes Historical Provider, MD  Cholecalciferol (VITAMIN D3) 2000 units TABS Take 2,000 Units by mouth daily.   Yes Historical Provider, MD  cilostazol (PLETAL) 100 MG tablet Take 100 mg by mouth daily.   Yes Historical Provider, MD  cycloSPORINE (RESTASIS) 0.05 % ophthalmic emulsion Place 1 drop into both eyes 2 (two) times daily. One drop once a day bilaterally for dry eyes   Yes Historical Provider, MD  diclofenac (VOLTAREN) 75 MG EC tablet TAKE 1 TABLET BY MOUTH 2 TIMES A DAY 09/02/16  Yes Titorya Stover, DPM  DULoxetine (CYMBALTA) 30 MG capsule TAKE 1 CAPSULE BY MOUTH  EVERY DAY TO HELP ANXIETY AND PAINS 04/15/16  Yes [name], NP  EASY TOUCH INSULIN SYRINGE 31G X 5/16" 0.5 ML MISC USE UP TO 2 TIMES A DAY AS DIRECTED. 08/05/16  Yes [name], NP  furosemide (LASIX) 40 MG tablet Take 1 tablet by mouth 2 (two) times daily. 08/12/16  Yes Historical Provider, MD  gabapentin (NEURONTIN) 100 MG capsule TAKE 2 CAPSULES BY MOUTH THREE TIMES A DAY 08/05/16  Yes [name], NP  hydrALAZINE (APRESOLINE) 50 MG tablet TAKE 1 TABLET BY MOUTH TWICE DAILY 07/08/16  Yes [name], NP  HYDROcodone-acetaminophen (NORCO) 10-325 MG tablet Take 1 tablet by mouth 4 (four) times daily. 09/18/16  Yes Gildardo Cranker, DO  insulin NPH-regular Human (NOVOLIN 70/30) (70-30) 100 UNIT/ML injection Inject 40 units subcutaneous with breakfast and then take 25 units with dinner to control blood sugar. 09/24/16  Yes Cathey Miller, RPH-CPP  ipratropium-albuterol (DUONEB) 0.5-2.5 (3) MG/3ML SOLN Take 2mls  by nebulization every 6 hours as needed for SOB/Wheezing 01/09/16  Yes [name], NP  linaclotide (LINZESS) 145 MCG CAPS capsule Take 145 mcg by mouth 2 (two) times daily.   Yes Historical Provider, MD  metoprolol succinate (TOPROL-XL) 25 MG 24 hr tablet TAKE 1 TABLET BY MOUTH EVERY DAY WITH OR IMMEDIATELY FOLLOWING A MEAL FOR BLOOD PRESSURE 04/15/16  Yes [name], NP  NEXIUM 40 MG capsule TAKE 1 CAPSULE BY MOUTH ONCE DAILY 04/15/16  Yes [name], NP  potassium chloride SA (K-DUR,KLOR-CON) 20 MEQ tablet TAKE 1 TABLET BY MOUTH EVERY DAY 07/08/16  Yes [name], NP  sertraline (ZOLOFT) 50 MG tablet TAKE 1 TABLET BY MOUTH EVERY DAY. 07/08/16  Yes [name], NP  simvastatin (ZOCOR) 20 MG tablet TAKE 1 TABLET BY MOUTH EVERY DAY 04/15/16  Yes [name], NP  sucralfate (CARAFATE) 1 g tablet TAKE 1 TABLET BY MOUTH TWICE DAILY ON EMPTY STOMACH. 06/10/16  Yes [name], NP  timolol (TIMOPTIC) 0.5 % ophthalmic solution INSTILL 1 DROP INTO EACH  EYE TWICE A DAY 02/12/16  Yes [name], NP  torsemide (DEMADEX) 20 MG tablet Take 1 tablet by mouth daily for edema. 01/22/16  Yes [name], NP  TRAVATAN Z 0.004 % SOLN ophthalmic solution Place 1 drop into both eyes at bedtime.  04/09/16  Yes Historical Provider, MD  albuterol (PROVENTIL HFA;VENTOLIN HFA) 108 (90 Base) MCG/ACT inhaler Inhale 2 puffs into the lungs every 4 (four) hours as needed for wheezing or shortness of breath.    Historical Provider, MD  budesonide (PULMICORT) 0.25 MG/2ML nebulizer solution Take one ampule in Neb twice daily routinely for COPD  J44.1 05/29/16   Gildardo Cranker, DO  cefUROXime (CEFTIN) 250 MG tablet Take 1 tablet (250 mg total) by mouth daily. Patient not taking: Reported on 09/24/2016 09/18/16   Gildardo Cranker, DO  cloNIDine (CATAPRES) 0.1 MG tablet  09/23/16   Historical Provider, MD  dextromethorphan-guaiFENesin (MUCINEX DM) 30-600 MG 12hr tablet Take 2 tablets by mouth 2 (two) times daily. Patient not taking: Reported on 09/24/2016 05/25/16   Sid Falcon, MD  diclofenac sodium (VOLTAREN) 1 % GEL APPLY 4 GRAM TOPICALLY TWICE DAILY as needed for pain 06/08/16   [name], NP  enalapril (VASOTEC) 10 MG tablet  09/23/16   Historical Provider, MD  fentaNYL (DURAGESIC - DOSED MCG/HR) 25 MCG/HR patch  08/12/16   Historical Provider, MD  guaifenesin (ROBITUSSIN) 100 MG/5ML syrup Take 200 mg by mouth 3 (three) times daily as needed for cough.    Historical Provider, MD  polyethylene glycol (MIRALAX / GLYCOLAX) packet  09/23/16   Historical Provider, MD      Constitutional: Obese female in some mild distress easily winded with any movement  Vitals:   09/24/16 2200 09/24/16 2207 09/24/16 2230 09/24/16 2300  BP: 162/69 162/69 147/80 143/66  Pulse: (!) 59 (!) 58 (!) 58   Resp: 15 14 (!) 30 24  Temp:      TempSrc:      SpO2: 99% 98% 97%    Eyes: PERRL, lids and conjunctivae normal ENMT: Mucous membranes are moist. Posterior pharynx clear of  any exudate or lesions. Neck: normal, supple, no masses, no thyromegaly Respiratory: Tachypneic with rales  appreciated. Patient able to talk in shorten sinuses on 2 L nasal cannula oxygen.  Cardiovascular: Regular rate and rhythm, no murmurs / rubs / gallops. No extremity edema. 2+ pedal pulses. No carotid  bruits.  Abdomen: no tenderness, no masses palpated. No hepatosplenomegaly. Bowel sounds positive.  Musculoskeletal: no clubbing / cyanosis. No joint deformity upper and lower extremities. Good ROM, no contractures. Normal muscle tone.  Skin: no rashes, lesions, ulcers. No induration Neurologic: CN 2-12 grossly intact. Sensation intact, DTR normal. Strength 5/5 in all 4.  Psychiatric: Normal judgment and insight. Alert and oriented x 3. Normal mood.     Labs on Admission: I have personally reviewed following labs and imaging studies  CBC:  Recent Labs Lab 09/24/16 1959  WBC 9.0  HGB 10.8*  HCT 34.3*  MCV 81.1  PLT 010   Basic Metabolic Panel:  Recent Labs Lab 09/24/16 1959  NA 141  K 3.5  CL 106  CO2 25  GLUCOSE 52*  BUN 36*  CREATININE 2.72*  CALCIUM 9.4   GFR: Estimated Creatinine Clearance: 18.8 mL/min (by C-G formula based on SCr of 2.72 mg/dL (H)). Liver Function Tests: No results for input(s): AST, ALT, ALKPHOS, BILITOT, PROT, ALBUMIN in the last 168 hours. No results for input(s): LIPASE, AMYLASE in the last 168 hours. No results for input(s): AMMONIA in the last 168 hours. Coagulation Profile: No results for input(s): INR, PROTIME in the last 168 hours. Cardiac Enzymes:  Recent Labs Lab 09/24/16 1959  TROPONINI <0.03   BNP (last 3 results) No results for input(s): PROBNP in the last 8760 hours. HbA1C: No results for input(s): HGBA1C in the last 72 hours. CBG:  Recent Labs Lab 09/24/16 2104  GLUCAP 106*   Lipid Profile: No results for input(s): CHOL, HDL, LDLCALC, TRIG, CHOLHDL, LDLDIRECT in the last 72 hours. Thyroid Function Tests: No  results for input(s): TSH, T4TOTAL, FREET4, T3FREE, THYROIDAB in the last 72 hours. Anemia Panel: No results for input(s): VITAMINB12, FOLATE, FERRITIN, TIBC, IRON, RETICCTPCT in the last 72 hours. Urine analysis:    Component Value Date/Time   COLORURINE YELLOW 12/28/2015 [place] 12/28/2015 1305   LABSPEC 1.007 12/28/2015 1305   PHURINE 5.0 12/28/2015 1305   GLUCOSEU NEGATIVE 12/28/2015 1305   HGBUR TRACE (A) 12/28/2015 1305   BILIRUBINUR NEGATIVE 12/28/2015 1305   KETONESUR NEGATIVE 12/28/2015 1305   PROTEINUR NEGATIVE 12/28/2015 1305   UROBILINOGEN 0.2 02/14/2008 0947   NITRITE NEGATIVE 12/28/2015 1305   LEUKOCYTESUR NEGATIVE 12/28/2015 1305   Sepsis Labs: No results found for this or any previous visit (from the past 240 hour(s)).   Radiological Exams on Admission: Dg Chest 2 View  Result Date: 09/24/2016 CLINICAL DATA:  Increase chest pressure EXAM: CHEST  2 VIEW COMPARISON:  September 14, 2016 FINDINGS: The mediastinal contour is normal. The heart size is mildly enlarged. The aorta is tortuous. There is mild central pulmonary vascular congestion. There is no focal pneumonia. There is a minimal left pleural effusion. The visualized skeletal structures are stable. IMPRESSION: Cardiomegaly with mild central pulmonary vascular congestion. Electronically Signed   By: Abelardo Diesel M.D.   On: 09/24/2016 19:38    EKG: Independently reviewed. Sinus rhythm  Assessment/Plan Suspect acute Diastolic CHF: Acute on chronic. Patient reports progressively worsening shortness of breath especially with exertion and lower extremity swelling. BNP at 78 but patient is obese. Previously seen higher once before in the past up to 162 back in 2016, but it appears that she's had other admissions for CHF. Ejection fraction 65-70% in 01/2016. Currently unclear picture as patient's weight seems to be close to her reported as baseline chest x-ray showing some mild congestion. - Admit to a  telemetry  bed - Strict ins and outs and daily weights - lasix 40 mg IV  1 dose given in ED, reassess in a.m. to determine if patient benefit from continued IV diuresis. - Continue aspirin, metoprolol - TED hoses for bilateralr extremity edema - Echocardiogram in a.m. - Consider need of formal consultation to cardiology in a.m.  Possible acute on chronic exacerbation of COPD/chronic respiratory failure: Patient appears to be able to maintain O2 saturations at least at rest while on nasal cannula oxygen at home 2 L. Question if patient's symptoms are a combination of her deconditioning, weight, and limited baseline mobility. Appreciated some rhonchi and rales on physical exam, but patient doesn't give great clinical presentation for COPD exacerbation. - DuoNeb's every 4 hours as needed for shortness of breath - Brovana and budesonide nebs - Continue Mucinex - Physical therapy to ambulate patient with pulse oximetry and nasal cannula oxygen  Chest pressure: Acute. Patient reports centralized chest pressure but initial troponin 0.0 and EKG similar to previous with no significant ischemic changes. - Trend troponins   Essential hypertension - Continue hydralazine, metoprolol  Chronic kidney disease stage IV: BUN and creatinine similar to 2 weeks prior. - Did not restart Voltaren oral medication - Continue to monitor  Peripheral vascular disease - Continue cilostazol  Hypoglycemia with history of Diabetes mellitus type 2: Acute patient with a baseline blood glucose of 52 on admission. Patient given crackers in ED. Patient on 70/30 insulin at home. - Hypoglycemic protocols - Held home 70/30 insulin - CBGs every 4 hours for now with sensitive sliding scale insulin. Reassess in a.m. and adjust regimen as medically necessary - Continue gabapentin  OSA: Patient recently suddenly diagnosed with sleep apnea and noted to be fitted for a CPAP machine which is to be brought out to her home she  states sometime this week.. -CPAP per RT   Anxiety Chronic pain - Continue Cymbalta and Zoloft  Anemia:  chronic. Hemoglobin 10.8 appears to be near patient baseline - Continue to monitor  HLD - continue simvastatin  GERD  - Continue Carafate and substitution of Protonix or Nexium  DVT prophylaxis: heparin Code Status:  full Family Communication: No family present at bedside  Disposition Plan: Likely discharge home once medically stable  Consults called: None  Admission status: inpatient  Norval Morton MD [facility] Pager [phone]  If 7PM-7AM, please contact night-coverage [email or web address] Password [email or web address]  09/25/2016, 12:25 AM

## 2016-09-25 NOTE — ED Provider Notes (Signed)
Patient seen and evaluated. Discussed with Verdene Rio PA-C. Reports dyspnea increased leg swelling. Her renal function is above baseline. She lives alone. Chest x-ray suggests CHF. Similar to an x-ray a few weeks ago but different from an x-ray of May of this year. Patient states she's had a cough and "cold symptoms" eating shortness of breath and cough for the last several weeks. Not in frank pulmonary edema. Has chest pain but has no acute ST changes or troponin abnormalities. A degree with admission for diuresis/ rule out.   Tanna Furry, MD 09/25/16 [phone]

## 2016-09-25 NOTE — Progress Notes (Signed)
RT placed patient on patient . Patient setting is auto 5-20 cmH2O. Sterile water was added to water chamber for humidification. Patient is tolerating well. RT will continue to monitor.

## 2016-09-25 NOTE — Progress Notes (Signed)
Pt HR sustaining in the 50's per CCMD.  MD aware.  Pt asymptomatic.

## 2016-09-25 NOTE — Care Management Note (Signed)
Case Management Note  Patient Details  Name: Michelle Buck MRN: [id number] Date of Birth: 1936-11-02  Subjective/Objective:  79 y/o f admitted w/CHF. From home. PT recc HHPT-patient states she receives HHC, & home 02-but unable to state who she uses-she will ask her sister to find out-since names are on her refrigerator. Provided w/HHC agency list await choice, & HHPT,f20f order.  I have asked [facility] to check if patient is receiving home 02 from them-await response.                Action/Plan:d/c home w/HHC.   Expected Discharge Date:                  Expected Discharge Plan:  [place]  In-House Referral:     Discharge planning Services     Post Acute Care Choice:  Durable Medical Equipment (gets home 02 HS-patient not sure of home oxygen company. also not sure of [place] agency) Choice offered to:  Patient  DME Arranged:    DME Agency:     HH Arranged:    [place] Agency:     Status of Service:  In process, will continue to follow  If discussed at Long Length of Stay Meetings, dates discussed:    Additional Comments:  Dessa Phi, RN 09/25/2016, 1:14 PM

## 2016-09-25 NOTE — Evaluation (Signed)
Physical Therapy Evaluation [name] MRN: [id number] DOB: 1936-07-07 Today's Date: 09/25/2016   History of Present Illness  80 yo female admitted with diastolic CHF. Hx of chronic pain, COPD, CHF, HTN, DM, CKD stage IV, anxiety, peripheral neuropathy  Clinical Impression  On eval, pt required close guarding/Min guard assist for mobility. She walked ~50 feet with a RW. O2 sats 98% on 2L O2 during ambulation. Pt fatigues easily. Recommend daily ambulation in hallway with nursing supervision/assist. Will follow and progress activity as tolerated.     Follow Up Recommendations Home health PT;Supervision - Intermittent; Home Health Aide    Equipment Recommendations  None recommended by PT    Recommendations for Other Services       Precautions / Restrictions Precautions Precautions: Fall Precaution Comments: monitor O2 sats Restrictions Weight Bearing Restrictions: No      Mobility  Bed Mobility Overal bed mobility: Needs Assistance Bed Mobility: Supine to Sit     Supine to sit: Supervision;HOB elevated     General bed mobility comments: Increased time  Transfers Overall transfer level: Needs assistance Equipment used: Rolling walker (2 wheeled) Transfers: Sit to/from Stand Sit to Stand: Supervision         General transfer comment: for safety. Increased time.  Ambulation/Gait Ambulation/Gait assistance: Min guard Ambulation Distance (Feet): 50 Feet Assistive device: Rolling walker (2 wheeled) Gait Pattern/deviations: Decreased stride length;Trunk flexed     General Gait Details: Pt fatigues easily. Remained on [place] O2-sats 98% on 2L. 2 brief standing rest breaks needed during that short distance. At times, pt rested forearms on walker handles.  Stairs            Wheelchair Mobility    Modified Rankin (Stroke Patients Only)       Balance Overall balance assessment: Needs assistance           Standing balance-Leahy  Scale: Poor Standing balance comment: requires RW                             Pertinent Vitals/Pain Pain Assessment: Faces Faces Pain Scale: Hurts little more Pain Location: pt c/o pain from compression stockings-removed stockings (at pt's request) and made RN aware Pain Descriptors / Indicators: Aching;Sore Pain Intervention(s): Limited activity within patient's tolerance;Repositioned    Home Living Family/patient expects to be discharged to:: Private residence Living Arrangements: Alone   Type of Home: Apartment Home Access: Ramped entrance     Home Layout: One level Home Equipment: Walker - 4 wheels;Hand held shower head;Shower seat;Grab bars - toilet      Prior Function Level of Independence: Needs assistance   Gait / Transfers Assistance Needed: uses rollator independently at home           Hand Dominance        Extremity/Trunk Assessment   Upper Extremity Assessment: Generalized weakness           Lower Extremity Assessment: Generalized weakness      Cervical / Trunk Assessment: Normal  Communication   Communication: No difficulties  Cognition Arousal/Alertness: Awake/alert Behavior During Therapy: WFL for tasks assessed/performed Overall Cognitive Status: Within Functional Limits for tasks assessed                      General Comments      Exercises     Assessment/Plan    PT Assessment Patient needs continued PT services  PT Problem List Decreased strength;Decreased mobility;Decreased  balance;Decreased activity tolerance;Pain;Decreased knowledge of use of DME;Obesity          PT Treatment Interventions DME instruction;Gait training;Therapeutic activities;Therapeutic exercise;Functional mobility training;Balance training;Patient/family education    PT Goals (Current goals can be found in the Care Plan section)  Acute Rehab PT Goals Patient Stated Goal: to get better. to start walking more. PT Goal Formulation: With  patient Time For Goal Achievement: 10/09/16 Potential to Achieve Goals: Good    Frequency Min 3X/week   Barriers to discharge        Co-evaluation               End of Session Equipment Utilized During Treatment: Oxygen Activity Tolerance: Patient limited by fatigue Patient left: in chair;with call bell/phone within reach;with chair alarm set           Time: 8502-7741 PT Time Calculation (min) (ACUTE ONLY): 25 min   Charges:   PT Evaluation $PT Eval Low Complexity: 1 Procedure     PT G Codes:        [name], MPT Pager: [phone]

## 2016-09-26 ENCOUNTER — Inpatient Hospital Stay (HOSPITAL_COMMUNITY): Payer: Medicare Other

## 2016-09-26 DIAGNOSIS — J449 Chronic obstructive pulmonary disease, unspecified: Secondary | ICD-10-CM

## 2016-09-26 DIAGNOSIS — I5033 Acute on chronic diastolic (congestive) heart failure: Secondary | ICD-10-CM

## 2016-09-26 LAB — BASIC METABOLIC PANEL
ANION GAP: 9 (ref 5–15)
BUN: 26 mg/dL — ABNORMAL HIGH (ref 6–20)
CHLORIDE: 105 mmol/L (ref 101–111)
CO2: 27 mmol/L (ref 22–32)
CREATININE: 2.45 mg/dL — AB (ref 0.44–1.00)
Calcium: 8.9 mg/dL (ref 8.9–10.3)
GFR calc non Af Amer: 18 mL/min — ABNORMAL LOW (ref >60)
GFR, EST AFRICAN AMERICAN: 21 mL/min — AB (ref >60)
Glucose, Bld: 92 mg/dL (ref 65–99)
POTASSIUM: 4.2 mmol/L (ref 3.5–5.1)
SODIUM: 141 mmol/L (ref 135–145)

## 2016-09-26 LAB — GLUCOSE, CAPILLARY
GLUCOSE-CAPILLARY: 96 mg/dL (ref 65–99)
GLUCOSE-CAPILLARY: 178 mg/dL — AB (ref 65–99)
GLUCOSE-CAPILLARY: 130 mg/dL — AB (ref 65–99)
GLUCOSE-CAPILLARY: 136 mg/dL — AB (ref 65–99)

## 2016-09-26 MED ORDER — PREDNISONE 20 MG PO TABS
40.0000 mg | ORAL_TABLET | Freq: Every day | ORAL | Status: DC
  Administered 2016-09-26 – 2016-09-28 (×2): 40 mg via ORAL
  Filled 2016-09-26 (×3): qty 2

## 2016-09-26 NOTE — Progress Notes (Signed)
Second attempt made offering assistance with cpap, but pt declined.  Pt was advised that RT is available all night and encouraged her to call, should she change her mind.  RN aware.  Cpap in room on standby if needed.

## 2016-09-26 NOTE — Progress Notes (Signed)
PROGRESS NOTE    Tameko Halder  CBS:[id number] DOB: 1936/04/23 DOA: 09/24/2016 PCP: [name], NP    Brief Narrative: Angeleen Horney is a 80 y.o. female with medical history significant of COPD, chronic respiratory failure on 2 L 84/6, diastolic CHF last EF 65- 65%, DM type II CKD stage IV, anxiety, OSA on CPAP; who presents with complaints of progressively worsening shortness of breath and chest pressure over the last 3-4 weeks, worsened over the last two days.   Assessment & Plan:   Principal Problem:   Diastolic CHF ([place]) Active Problems:   Diabetes mellitus type 2 in obese [facility])   Essential hypertension, benign   Anemia of chronic kidney failure   OSA (obstructive sleep apnea)   COPD (chronic obstructive pulmonary disease) (HCC)   Chest pressure   Acute on chronic diastolic heart failure: Admitted to telemetry.  I/O last 3 completed shifts: In: 762 [P.O.:762] Out: 2300 [Urine:2300] Total I/O In: 720 [P.O.:720] Out: 550 [Urine:550]  Not much diuresis since admission.  Currently on lasix IV 20 mg BID.  Creatinine improving with diursis.  Continues to require 2 lit of [place] oxygen to keep sats greater than 90%.  Cardiac enzymes negative.  BNP is 78.  CXR shows pulmonary vascular congestion.  Plan for repeat CXR in am.  Pt reports persistent sob on ambulation.     acute on Stage 4 CKD :  Improving with diuresis.  Monitor.  Since creatinine is improving with diuresis, would recommend for outpatient follow up with renal.    Hypertension controlled.   Diabetes Mellitus: CBG (last 3)   Recent Labs  09/26/16 0821 09/26/16 1151 09/26/16 1630  GLUCAP 96 178* 130*    Resume SSI.   COPD; Wheezing today, recommend duo nebs. , add prednisone.   Chest pressure:  Resolved.  troponins negative.  EKG shows sinus rhythm , no ischemic changes.  Continue to monitor.    Anemia of chronic disease: Stable around 10.    DVT prophylaxis: heparin. Code Status:  (Full/) Family Communication: none at bedside.  Disposition Plan: pending pt EVALUATION.   Consultants:   None.    Procedures: none.    Antimicrobials: none.    Subjective: Breathing better, requesting for pain medication.  Objective: Vitals:   09/25/16 2150 09/26/16 0518 09/26/16 0923 09/26/16 1423  BP: 126/66 (!) 168/69  117/83  Pulse: 67 71  62  Resp: 18 18  18   Temp: 97.7 F (36.5 C) 98.2 F (36.8 C)  99.1 F (37.3 C)  TempSrc: Oral Oral  Oral  SpO2: 98% 97% 98% 100%  Weight:  97.4 kg (214 lb 11.2 oz)    Height:        Intake/Output Summary (Last 24 hours) at 09/26/16 1846 Last data filed at 09/26/16 1500  Gross per 24 hour  Intake             1002 ml  Output             1125 ml  Net             -123 ml   Filed Weights   09/25/16 0146 09/25/16 0604 09/26/16 0518  Weight: 100 kg (220 lb 8 oz) 98.8 kg (217 lb 13 oz) 97.4 kg (214 lb 11.2 oz)    Examination:  General exam: Appears calm and comfortable on 2 lit of [place] oxygen.  Respiratory system: scattered rales at bases, bilateral wheezing.  Cardiovascular system: S1 & S2 heard, RRR. No JVD, murmurs,  rubs, gallops or clicks. Gastrointestinal system: Abdomen is nondistended, soft and nontender. No organomegaly or masses felt. Normal bowel sounds heard. Central nervous system: Alert and oriented. No focal neurological deficits. Extremities: Symmetric 5 x 5 power. Skin: No rashes, lesions or ulcers Psychiatry: Judgement and insight appear normal. Mood & affect appropriate.     Data Reviewed: I have personally reviewed following labs and imaging studies  CBC:  Recent Labs Lab 09/24/16 1959 09/25/16 0359  WBC 9.0 6.7  NEUTROABS  --  3.4  HGB 10.8* 10.2*  HCT 34.3* 31.9*  MCV 81.1 80.8  PLT 282 595   Basic Metabolic Panel:  Recent Labs Lab 09/24/16 1959 09/26/16 0541  NA 141 141  K 3.5 4.2  CL 106 105  CO2 25 27  GLUCOSE 52* 92  BUN 36* 26*  CREATININE 2.72* 2.45*  CALCIUM 9.4 8.9    GFR: Estimated Creatinine Clearance: 20.6 mL/min (by C-G formula based on SCr of 2.45 mg/dL (H)). Liver Function Tests: No results for input(s): AST, ALT, ALKPHOS, BILITOT, PROT, ALBUMIN in the last 168 hours. No results for input(s): LIPASE, AMYLASE in the last 168 hours. No results for input(s): AMMONIA in the last 168 hours. Coagulation Profile: No results for input(s): INR, PROTIME in the last 168 hours. Cardiac Enzymes:  Recent Labs Lab 09/24/16 1959 09/25/16 0359 09/25/16 0754  TROPONINI <0.03 <0.03 <0.03   BNP (last 3 results) No results for input(s): PROBNP in the last 8760 hours. HbA1C: No results for input(s): HGBA1C in the last 72 hours. CBG:  Recent Labs Lab 09/25/16 1639 09/25/16 2143 09/26/16 0821 09/26/16 1151 09/26/16 1630  GLUCAP 87 137* 96 178* 130*   Lipid Profile: No results for input(s): CHOL, HDL, LDLCALC, TRIG, CHOLHDL, LDLDIRECT in the last 72 hours. Thyroid Function Tests: No results for input(s): TSH, T4TOTAL, FREET4, T3FREE, THYROIDAB in the last 72 hours. Anemia Panel: No results for input(s): VITAMINB12, FOLATE, FERRITIN, TIBC, IRON, RETICCTPCT in the last 72 hours. Sepsis Labs: No results for input(s): PROCALCITON, LATICACIDVEN in the last 168 hours.  No results found for this or any previous visit (from the past 240 hour(s)).       Radiology Studies: Dg Chest 2 View  Result Date: 09/24/2016 CLINICAL DATA:  Increase chest pressure EXAM: CHEST  2 VIEW COMPARISON:  September 14, 2016 FINDINGS: The mediastinal contour is normal. The heart size is mildly enlarged. The aorta is tortuous. There is mild central pulmonary vascular congestion. There is no focal pneumonia. There is a minimal left pleural effusion. The visualized skeletal structures are stable. IMPRESSION: Cardiomegaly with mild central pulmonary vascular congestion. Electronically Signed   By: Abelardo Diesel M.D.   On: 09/24/2016 19:38        Scheduled Meds: .  arformoterol  15 mcg Nebulization BID  . aspirin EC  81 mg Oral Daily  . budesonide (PULMICORT) nebulizer solution  0.5 mg Nebulization BID  . cilostazol  100 mg Oral Daily  . cycloSPORINE  1 drop Both Eyes Daily  . DULoxetine  30 mg Oral Daily  . furosemide  20 mg Intravenous BID  . heparin  5,000 Units Subcutaneous Q8H  . hydrALAZINE  50 mg Oral BID  . HYDROcodone-acetaminophen  1 tablet Oral QID  . insulin aspart  0-9 Units Subcutaneous TID WC  . latanoprost  1 drop Both Eyes QHS  . linaclotide  145 mcg Oral BID  . metoprolol succinate  25 mg Oral Daily  . pantoprazole  40 mg Oral Daily  .  potassium chloride SA  20 mEq Oral Daily  . sertraline  50 mg Oral Daily  . simvastatin  20 mg Oral Daily  . sodium chloride flush  3 mL Intravenous Q12H  . sucralfate  1 g Oral BID  . timolol  1 drop Both Eyes BID   Continuous Infusions:    LOS: 1 day    Time spent: 35 minutes.     Hosie Poisson, MD [facility] Pager [phone]  If 7PM-7AM, please contact night-coverage [email or web address] Password [email or web address] 09/26/2016, 6:46 PM

## 2016-09-27 DIAGNOSIS — N183 Chronic kidney disease, stage 3 (moderate): Secondary | ICD-10-CM

## 2016-09-27 DIAGNOSIS — I10 Essential (primary) hypertension: Secondary | ICD-10-CM

## 2016-09-27 DIAGNOSIS — E669 Obesity, unspecified: Secondary | ICD-10-CM

## 2016-09-27 DIAGNOSIS — D631 Anemia in chronic kidney disease: Secondary | ICD-10-CM

## 2016-09-27 DIAGNOSIS — E1169 Type 2 diabetes mellitus with other specified complication: Secondary | ICD-10-CM

## 2016-09-27 LAB — BASIC METABOLIC PANEL
Anion gap: 9 (ref 5–15)
BUN: 25 mg/dL — AB (ref 6–20)
CHLORIDE: 103 mmol/L (ref 101–111)
CO2: 27 mmol/L (ref 22–32)
CREATININE: 2.29 mg/dL — AB (ref 0.44–1.00)
Calcium: 9.1 mg/dL (ref 8.9–10.3)
GFR calc non Af Amer: 19 mL/min — ABNORMAL LOW (ref >60)
GFR, EST AFRICAN AMERICAN: 22 mL/min — AB (ref >60)
Glucose, Bld: 183 mg/dL — ABNORMAL HIGH (ref 65–99)
POTASSIUM: 3.9 mmol/L (ref 3.5–5.1)
SODIUM: 139 mmol/L (ref 135–145)

## 2016-09-27 LAB — GLUCOSE, CAPILLARY
GLUCOSE-CAPILLARY: 162 mg/dL — AB (ref 65–99)
GLUCOSE-CAPILLARY: 128 mg/dL — AB (ref 65–99)
GLUCOSE-CAPILLARY: 90 mg/dL (ref 65–99)
Glucose-Capillary: 136 mg/dL — ABNORMAL HIGH (ref 65–99)

## 2016-09-27 MED ORDER — FUROSEMIDE 10 MG/ML IJ SOLN
40.0000 mg | Freq: Two times a day (BID) | INTRAMUSCULAR | Status: DC
  Administered 2016-09-27 – 2016-09-28 (×2): 40 mg via INTRAVENOUS
  Filled 2016-09-27 (×2): qty 4

## 2016-09-27 NOTE — Progress Notes (Signed)
Pt declined nocturnal cpap at this time.  Pt states she will try it again tomorrow night.  Pt was advised that RT is available all night and encouraged her to call, should she change her mind.

## 2016-09-27 NOTE — Progress Notes (Signed)
PROGRESS NOTE    Michelle Buck  OXB:[id number] DOB: 12-Apr-1936 DOA: 09/24/2016 PCP: [name], NP    Brief Narrative: Michelle Buck is a 80 y.o. female with medical history significant of COPD, chronic respiratory failure on 2 L 68/3, diastolic CHF last EF 65- 41%, DM type II CKD stage IV, anxiety, OSA on CPAP; who presents with complaints of progressively worsening shortness of breath and chest pressure over the last 3-4 weeks, worsened over the last two days.   Assessment & Plan:   Principal Problem:   Diastolic CHF ([place]) Active Problems:   Diabetes mellitus type 2 in obese [facility])   Essential hypertension, benign   Anemia of chronic kidney failure   OSA (obstructive sleep apnea)   COPD (chronic obstructive pulmonary disease) (HCC)   Chest pressure   Acute on chronic diastolic heart failure: Admitted to telemetry.  I/O last 3 completed shifts: In: 1122 [P.O.:1122] Out: 9622 [Urine:1125] Total I/O In: 120 [P.O.:120] Out: -   Not much diuresis since admission.  Currently on lasix IV 20 mg BID.  Creatinine improving with diursis. Increased the dose of lasix to 40 mg BID.  Continues to require 2 lit of [place] oxygen to keep sats greater than 90%.  Cardiac enzymes negative.  BNP is 78.  CXR on admission  shows pulmonary vascular congestion. rpeat CXR shows worsening or CHF, fluid restriction and daily weights ordered. Increased the lasix to 40 mg BID.  Pt reports persistent sob on ambulation.     acute on Stage 4 CKD :  Improving with diuresis.  Monitor.  Since creatinine is improving with diuresis, would recommend for outpatient follow up with renal.    Hypertension SUB OPTIMAL controlled.  Increased lasix today.  Diabetes Mellitus: CBG (last 3)   Recent Labs  09/27/16 0721 09/27/16 1200 09/27/16 1645  GLUCAP 162* 128* 90    Resume SSI.   COPD; Wheezing today, recommend duo nebs. , added prednisone.   Chest pressure:  Resolved.  troponins negative.    EKG shows sinus rhythm , no ischemic changes.  Continue to monitor.    Anemia of chronic disease: Stable around 10.    DVT prophylaxis: heparin. Code Status: (Full/) Family Communication: none at bedside.  Disposition Plan: home in am.    Consultants:   None.    Procedures: none.    Antimicrobials: none.    Subjective: She does not want to use the CPAP, educated on the importance of using CPAP for OSA.  Objective: Vitals:   09/27/16 0518 09/27/16 0747 09/27/16 1004 09/27/16 1300  BP: (!) 152/63  (!) 161/88 (!) 152/73  Pulse: (!) 52  62 (!) 55  Resp: 18   18  Temp: 98 F (36.7 C)   98.8 F (37.1 C)  TempSrc: Oral   Oral  SpO2: 99% 93%  100%  Weight: 98.4 kg (217 lb)     Height:        Intake/Output Summary (Last 24 hours) at 09/27/16 1758 Last data filed at 09/27/16 0841  Gross per 24 hour  Intake              240 ml  Output                0 ml  Net              240 ml   Filed Weights   09/25/16 0604 09/26/16 0518 09/27/16 0518  Weight: 98.8 kg (217 lb 13 oz) 97.4 kg (214  lb 11.2 oz) 98.4 kg (217 lb)    Examination:  General exam: Appears calm and comfortable on 2 lit of [place] oxygen.  Respiratory system: scattered rales at bases, bilateral wheezing.  Cardiovascular system: S1 & S2 heard, RRR. No JVD, murmurs, rubs, gallops or clicks. Gastrointestinal system: Abdomen is nondistended, soft and nontender. No organomegaly or masses felt. Normal bowel sounds heard. Central nervous system: Alert and oriented. No focal neurological deficits. Extremities: Symmetric 5 x 5 power. Skin: No rashes, lesions or ulcers Psychiatry: Judgement and insight appear normal. Mood & affect appropriate.     Data Reviewed: I have personally reviewed following labs and imaging studies  CBC:  Recent Labs Lab 09/24/16 1959 09/25/16 0359  WBC 9.0 6.7  NEUTROABS  --  3.4  HGB 10.8* 10.2*  HCT 34.3* 31.9*  MCV 81.1 80.8  PLT 282 379   Basic Metabolic Panel:  Recent  Labs Lab 09/24/16 1959 09/26/16 0541 09/27/16 0439  NA 141 141 139  K 3.5 4.2 3.9  CL 106 105 103  CO2 25 27 27   GLUCOSE 52* 92 183*  BUN 36* 26* 25*  CREATININE 2.72* 2.45* 2.29*  CALCIUM 9.4 8.9 9.1   GFR: Estimated Creatinine Clearance: 22.2 mL/min (by C-G formula based on SCr of 2.29 mg/dL (H)). Liver Function Tests: No results for input(s): AST, ALT, ALKPHOS, BILITOT, PROT, ALBUMIN in the last 168 hours. No results for input(s): LIPASE, AMYLASE in the last 168 hours. No results for input(s): AMMONIA in the last 168 hours. Coagulation Profile: No results for input(s): INR, PROTIME in the last 168 hours. Cardiac Enzymes:  Recent Labs Lab 09/24/16 1959 09/25/16 0359 09/25/16 0754  TROPONINI <0.03 <0.03 <0.03   BNP (last 3 results) No results for input(s): PROBNP in the last 8760 hours. HbA1C: No results for input(s): HGBA1C in the last 72 hours. CBG:  Recent Labs Lab 09/26/16 1630 09/26/16 2042 09/27/16 0721 09/27/16 1200 09/27/16 1645  GLUCAP 130* 136* 162* 128* 90   Lipid Profile: No results for input(s): CHOL, HDL, LDLCALC, TRIG, CHOLHDL, LDLDIRECT in the last 72 hours. Thyroid Function Tests: No results for input(s): TSH, T4TOTAL, FREET4, T3FREE, THYROIDAB in the last 72 hours. Anemia Panel: No results for input(s): VITAMINB12, FOLATE, FERRITIN, TIBC, IRON, RETICCTPCT in the last 72 hours. Sepsis Labs: No results for input(s): PROCALCITON, LATICACIDVEN in the last 168 hours.  No results found for this or any previous visit (from the past 240 hour(s)).       Radiology Studies: Dg Chest Port 1 View  Result Date: 09/26/2016 CLINICAL DATA:  Follow-up examination for progressive shortness of breath. History of COPD and CHF. EXAM: PORTABLE CHEST 1 VIEW COMPARISON:  Prior radiograph from 09/24/2016. FINDINGS: Stable cardiomegaly.  Mediastinal silhouette within normal limits. Lungs normally inflated. Diffuse vascular congestion with fullness of the  interstitial markings is slightly worsened from prior. Finding concerning for worsening pulmonary edema. No definite pleural effusion. No focal infiltrates. Atelectasis/scar noted along the right minor fissure, stable. No pneumothorax. No acute osseous abnormality. Severe degenerative osteoarthritic changes about the left shoulder noted. IMPRESSION: Cardiomegaly with slight interval worsening in diffuse vascular congestion and interstitial prominence, concerning for worsening CHF/edema. Electronically Signed   By: Jeannine Boga M.D.   On: 09/26/2016 23:56        Scheduled Meds: . arformoterol  15 mcg Nebulization BID  . aspirin EC  81 mg Oral Daily  . budesonide (PULMICORT) nebulizer solution  0.5 mg Nebulization BID  . cilostazol  100 mg  Oral Daily  . cycloSPORINE  1 drop Both Eyes Daily  . DULoxetine  30 mg Oral Daily  . furosemide  40 mg Intravenous BID  . heparin  5,000 Units Subcutaneous Q8H  . hydrALAZINE  50 mg Oral BID  . HYDROcodone-acetaminophen  1 tablet Oral QID  . insulin aspart  0-9 Units Subcutaneous TID WC  . latanoprost  1 drop Both Eyes QHS  . linaclotide  145 mcg Oral BID  . metoprolol succinate  25 mg Oral Daily  . pantoprazole  40 mg Oral Daily  . potassium chloride SA  20 mEq Oral Daily  . predniSONE  40 mg Oral QAC breakfast  . sertraline  50 mg Oral Daily  . simvastatin  20 mg Oral Daily  . sodium chloride flush  3 mL Intravenous Q12H  . sucralfate  1 g Oral BID  . timolol  1 drop Both Eyes BID   Continuous Infusions:    LOS: 2 days    Time spent: 25 minutes.     Hosie Poisson, MD [facility] Pager [phone]  If 7PM-7AM, please contact night-coverage [email or web address] Password [email or web address] 09/27/2016, 5:58 PM

## 2016-09-28 ENCOUNTER — Ambulatory Visit: Payer: Self-pay | Admitting: Nurse Practitioner

## 2016-09-28 DIAGNOSIS — G4733 Obstructive sleep apnea (adult) (pediatric): Secondary | ICD-10-CM

## 2016-09-28 LAB — BASIC METABOLIC PANEL
Anion gap: 7 (ref 5–15)
BUN: 26 mg/dL — AB (ref 6–20)
CHLORIDE: 101 mmol/L (ref 101–111)
CO2: 31 mmol/L (ref 22–32)
CREATININE: 2.48 mg/dL — AB (ref 0.44–1.00)
Calcium: 9.1 mg/dL (ref 8.9–10.3)
GFR calc Af Amer: 20 mL/min — ABNORMAL LOW (ref >60)
GFR calc non Af Amer: 18 mL/min — ABNORMAL LOW (ref >60)
GLUCOSE: 161 mg/dL — AB (ref 65–99)
Potassium: 3.7 mmol/L (ref 3.5–5.1)
SODIUM: 139 mmol/L (ref 135–145)

## 2016-09-28 LAB — GLUCOSE, CAPILLARY
GLUCOSE-CAPILLARY: 116 mg/dL — AB (ref 65–99)
Glucose-Capillary: 196 mg/dL — ABNORMAL HIGH (ref 65–99)

## 2016-09-28 MED ORDER — HYDROCODONE-ACETAMINOPHEN 10-325 MG PO TABS: 1.0000 | ORAL_TABLET | Freq: Four times a day (QID) | ORAL | 0 refills | Status: DC | PRN

## 2016-09-28 MED ORDER — HYDRALAZINE HCL 50 MG PO TABS: 50.0000 mg | ORAL_TABLET | Freq: Three times a day (TID) | ORAL | 0 refills | Status: DC

## 2016-09-28 MED ORDER — PREDNISONE 20 MG PO TABS: ORAL_TABLET | ORAL | 0 refills | Status: DC

## 2016-09-28 NOTE — Progress Notes (Signed)
SATURATION QUALIFICATIONS: (This note is used to comply with regulatory documentation for home oxygen)  Patient Saturations on Room Air at Rest = 97%  Patient Saturations on Room Air while Ambulating = 88%  Patient Saturations on 2 Liters of oxygen while Ambulating = 94%  Please briefly explain why patient needs home oxygen:

## 2016-09-28 NOTE — Care Management Note (Signed)
Case Management Note  Patient Details  Name: [name] MRN: [id number] Date of Birth: 05-27-1936  Subjective/Objective: home 02 ordered for continuous. No further CM needs.                  Action/Plan:   Expected Discharge Date:                  Expected Discharge Plan:  [place]  In-House Referral:     Discharge planning Services     Post Acute Care Choice:  Durable Medical Equipment (gets home 02 HS-patient not sure of home oxygen company. also not sure of [place] agency) Choice offered to:  Patient  DME Arranged:  Oxygen DME Agency:  [place]:  PT, RN, OT, Social Work [employer] Agency:  [place]  Status of Service:  Completed, signed off  If discussed at [employer], dates discussed:    Additional Comments:  Dessa Phi, RN 09/28/2016, 1:05 PM

## 2016-09-28 NOTE — Consult Note (Addendum)
   [facility] CM Inpatient Consult   09/28/2016  Elias Bordner 10/13/1936 [id number]    [facility] Care Management referral received. Called into patient's room to discuss and offer [place] Management program. She has been active with [place] Management in the past. She is agreeable and verbal consent obtained. However, there is an active consent on file. Explained to patient that she will receive post hospital transition of care calls and will be evaluated for monthly home visits. Confirmed Primary Care [name], NP.  Confirmed best contact number as [phone]. Explained that [place] Management will not interfere or replace services provided by home health. Will make inpatient RNCM aware that [place] Management will follow.  Will request for [facility] RNCM to be assigned. Ms.Grajeda is supposed to discharge home today. She will have home health services as well. She has a history of x2 hospital admissions in the past x6 months, CHF, DM, HTN, OSA, COPD.    Marthenia Rolling, MSN-Ed, RN,BSN [facility] Liaison [phone]

## 2016-09-28 NOTE — Progress Notes (Signed)
Went over all discharge paperwork with patient.  All questions answered.  Per patient request paged MD to have prescriptions sent to [facility] on [address].  Explained importance of taking medications as prescribed.  Pt wheeled out via wheelchair with o2 brought from [facility].

## 2016-09-28 NOTE — Care Management Note (Signed)
Case Management Note  Patient Details  Name: Delainie Chavana MRN: [id number] Date of Birth: May 08, 1936  Subjective/Objective: Awaiting 02 sats to be documented for qualification for home 02 continuous or prn.MD notified.[place] alredy ordered-await choice, & home 02 order. May need [facility]-will cons for screening.                Action/Plan:d/c plan home w/HHC.   Expected Discharge Date:                  Expected Discharge Plan:  [place]  In-House Referral:     Discharge planning Services     Post Acute Care Choice:  Durable Medical Equipment (gets home 02 HS-patient not sure of home oxygen company. also not sure of [place] agency) Choice offered to:  Patient  DME Arranged:    DME Agency:     HH Arranged:    [place] Agency:     Status of Service:  In process, will continue to follow  If discussed at Long Length of Stay Meetings, dates discussed:    Additional Comments:  Dessa Phi, RN 09/28/2016, 11:39 AM

## 2016-09-28 NOTE — Care Management Note (Signed)
Case Management Note  Patient Details  Name: Michelle Buck MRN: [id number] Date of Birth: Jan 25, 1936  Subjective/Objective:  Patient states she only has [place] aide @ home. Provided w/HHC agency list-patient chose-[facility](she used in past) spoke to rep Cheryl-able to accept-HHRn/PT/OT/sw ordered.  Patient still unable to confirm home [place]. Noted-qualified for home 02, still awaiting home 02 order-MD notified. [facility] dme rep Jermaine(states she doesn't get home 02 from [facility]) has brought travel tank to patient's rm-will not charge her- Informed patient that she must find out which home [place] she is using so they can bring additional home 02 set up tanks-patient voiced understanding. No further CM needs.                 Action/Plan:d/c home w/HHC/travel home 02 tank   Expected Discharge Date:                  Expected Discharge Plan:  [place] Referral:     Discharge planning Services     Post Acute Care Choice:  Durable Medical Equipment (gets home 02 HS-patient not sure of home oxygen company. also not sure of [place] agency) Choice offered to:  Patient  DME Arranged:  Oxygen DME Agency:  [place]:  PT, RN, OT, Social Work [employer] Agency:  [place]  Status of Service:  Completed, signed off  If discussed at [employer], dates discussed:    Additional Comments:  Dessa Phi, RN 09/28/2016, 12:59 PM

## 2016-09-29 ENCOUNTER — Ambulatory Visit: Payer: Self-pay

## 2016-09-29 ENCOUNTER — Other Ambulatory Visit: Payer: Self-pay | Admitting: Nurse Practitioner

## 2016-09-29 ENCOUNTER — Other Ambulatory Visit: Payer: Self-pay | Admitting: Sports Medicine

## 2016-09-29 DIAGNOSIS — Z9981 Dependence on supplemental oxygen: Secondary | ICD-10-CM | POA: Diagnosis not present

## 2016-09-29 DIAGNOSIS — G4733 Obstructive sleep apnea (adult) (pediatric): Secondary | ICD-10-CM | POA: Diagnosis not present

## 2016-09-29 DIAGNOSIS — M2141 Flat foot [pes planus] (acquired), right foot: Secondary | ICD-10-CM

## 2016-09-29 DIAGNOSIS — I5033 Acute on chronic diastolic (congestive) heart failure: Secondary | ICD-10-CM

## 2016-09-29 DIAGNOSIS — M1990 Unspecified osteoarthritis, unspecified site: Secondary | ICD-10-CM

## 2016-09-29 DIAGNOSIS — E1151 Type 2 diabetes mellitus with diabetic peripheral angiopathy without gangrene: Secondary | ICD-10-CM

## 2016-09-29 DIAGNOSIS — E669 Obesity, unspecified: Secondary | ICD-10-CM | POA: Diagnosis not present

## 2016-09-29 DIAGNOSIS — E1122 Type 2 diabetes mellitus with diabetic chronic kidney disease: Secondary | ICD-10-CM

## 2016-09-29 DIAGNOSIS — M19079 Primary osteoarthritis, unspecified ankle and foot: Secondary | ICD-10-CM

## 2016-09-29 DIAGNOSIS — J441 Chronic obstructive pulmonary disease with (acute) exacerbation: Secondary | ICD-10-CM

## 2016-09-29 DIAGNOSIS — N184 Chronic kidney disease, stage 4 (severe): Secondary | ICD-10-CM

## 2016-09-29 DIAGNOSIS — I739 Peripheral vascular disease, unspecified: Secondary | ICD-10-CM

## 2016-09-29 DIAGNOSIS — Z794 Long term (current) use of insulin: Secondary | ICD-10-CM | POA: Diagnosis not present

## 2016-09-29 DIAGNOSIS — E785 Hyperlipidemia, unspecified: Secondary | ICD-10-CM | POA: Diagnosis not present

## 2016-09-29 DIAGNOSIS — F419 Anxiety disorder, unspecified: Secondary | ICD-10-CM | POA: Diagnosis not present

## 2016-09-29 DIAGNOSIS — I130 Hypertensive heart and chronic kidney disease with heart failure and stage 1 through stage 4 chronic kidney disease, or unspecified chronic kidney disease: Secondary | ICD-10-CM

## 2016-09-29 DIAGNOSIS — M170 Bilateral primary osteoarthritis of knee: Secondary | ICD-10-CM

## 2016-09-29 DIAGNOSIS — E1142 Type 2 diabetes mellitus with diabetic polyneuropathy: Secondary | ICD-10-CM

## 2016-09-29 DIAGNOSIS — M2142 Flat foot [pes planus] (acquired), left foot: Secondary | ICD-10-CM

## 2016-09-29 DIAGNOSIS — H409 Unspecified glaucoma: Secondary | ICD-10-CM | POA: Diagnosis not present

## 2016-09-29 NOTE — Discharge Summary (Signed)
Physician Discharge Summary  Katalyna Socarras KTG:[id number] DOB: Oct 10, 1936 DOA: 09/24/2016  PCP: [name], NP  Admit date: 09/24/2016 Discharge date: 09/28/2016  Admitted From: Home.  Disposition:  HOme.   Recommendations for Outpatient Follow-up:  1. Follow up with PCP in 1-2 weeks 2. Please obtain BMP/CBC in one week 3. Please follow up with cardiology and nephrology in 2 weeks.   Home Health:yes Equipment/Devices:oxygen.   Discharge Condition:stable.  CODE STATUS:full. Diet recommendation: Heart Healthy / Carb Modified /  Brief/Interim Summary: Barby Colvard a 80 y.o.femalewith medical history significant of COPD,chronic respiratory failure on 2 L 42/8, diastolic CHF last EF 65- 76%, DM type II CKD stage IV, anxiety, OSA on CPAP; who presents with complaints of progressively worsening shortness of breath and chest pressure over the last 3-4 weeks, worsened over the last two days.   Discharge Diagnoses:  Principal Problem:   Diastolic CHF ([place]) Active Problems:   Diabetes mellitus type 2 in obese [facility])   Essential hypertension, benign   Anemia of chronic kidney failure   OSA (obstructive sleep apnea)   COPD (chronic obstructive pulmonary disease) (HCC)   Chest pressure  Acute on chronic diastolic heart failure: Admitted to telemetry.  I/O last 3 completed shifts: In: 1122 [P.O.:1122] Out: 8115 [Urine:1125] Total I/O In: 120 [P.O.:120] Out: -   Not much diuresis since admission.  Started on lasix IV 20 mg BID.  Creatinine improving with diursis. Increased the dose of lasix to 40 mg BID.  Continues to require 2 lit of [place] oxygen to keep sats greater than 90%.  Cardiac enzymes negative.  BNP is 80.  CXR on admission  shows pulmonary vascular congestion. rpeat CXR shows worsening or CHF, fluid restriction and daily weights ordered. Increased the lasix to 40 mg BID.      Acute on Stage 4 CKD :  Improved with diuresis.  Monitor.  Since creatinine is  improving with diuresis, would recommend for outpatient follow up with renal.    Hypertension  better controlled.   Diabetes Mellitus: CBG (last 3)   Recent Labs (last 2 labs)    Recent Labs  09/27/16 0721 09/27/16 1200 09/27/16 1645  GLUCAP 162* 128* 90      Resume SSI.   COPD; No wheezing today.   Chest pressure:  Resolved.  troponins negative.  EKG shows sinus rhythm , no ischemic changes.  Continue to monitor.    Anemia of chronic disease: Stable around 10.    Discharge Instructions  Discharge Instructions    AMB Referral to [facility]    Complete by:  As directed    Please assign to [facility]. To discharge home today. Will have home health as well. Verbal consent obtained. However, active consent remains on file. History of CHF, COPD, DM. Please call with questions. Marthenia Rolling, [place], RN,BSN [facility] [phone]   Reason for consult:  Please assign to [facility] RNCM   Diagnoses of:   COPD/ Pneumonia Diabetes Heart Failure     Expected date of contact:  1-3 days (reserved for hospital discharges)   Diet - low sodium heart healthy    Complete by:  As directed    Discharge instructions    Complete by:  As directed    Please follow up with cardiology in 1 to 2 weeks.  Please follow up with nephrology in 2 weeks.  You will need oxygen 24 hours a day.       Medication List  STOP taking these medications   cefUROXime 250 MG tablet Commonly known as:  CEFTIN   cloNIDine 0.1 MG tablet Commonly known as:  CATAPRES   dextromethorphan-guaiFENesin 30-600 MG 12hr tablet Commonly known as:  MUCINEX DM   diclofenac 75 MG EC tablet Commonly known as:  VOLTAREN   enalapril 10 MG tablet Commonly known as:  VASOTEC   fentaNYL 25 MCG/HR patch Commonly known as:  DURAGESIC - dosed mcg/hr   polyethylene glycol packet Commonly known as:  MIRALAX / GLYCOLAX   torsemide 20 MG  tablet Commonly known as:  DEMADEX     TAKE these medications   albuterol 108 (90 Base) MCG/ACT inhaler Commonly known as:  PROVENTIL HFA;VENTOLIN HFA Inhale 2 puffs into the lungs every 4 (four) hours as needed for wheezing or shortness of breath.   AMITIZA 24 MCG capsule Generic drug:  lubiprostone TAKE 1 CAPSULE BY MOUTH TWICE DAILY WITH MEALS   aspirin EC 81 MG tablet Take 81 mg by mouth daily.   budesonide 0.25 MG/2ML nebulizer solution Commonly known as:  PULMICORT Take one ampule in Neb twice daily routinely for COPD  J44.1   cilostazol 100 MG tablet Commonly known as:  PLETAL Take 100 mg by mouth daily.   cycloSPORINE 0.05 % ophthalmic emulsion Commonly known as:  RESTASIS Place 1 drop into both eyes 2 (two) times daily. One drop once a day bilaterally for dry eyes   diclofenac sodium 1 % Gel Commonly known as:  VOLTAREN APPLY 4 GRAM TOPICALLY TWICE DAILY as needed for pain   DULoxetine 30 MG capsule Commonly known as:  CYMBALTA TAKE 1 CAPSULE BY MOUTH EVERY DAY TO HELP ANXIETY AND PAINS   EASY TOUCH INSULIN SYRINGE 31G X 5/16" 0.5 ML Misc Generic drug:  Insulin Syringe-Needle U-100 USE UP TO 2 TIMES A DAY AS DIRECTED.   furosemide 40 MG tablet Commonly known as:  LASIX Take 1 tablet by mouth 2 (two) times daily.   gabapentin 100 MG capsule Commonly known as:  NEURONTIN TAKE 2 CAPSULES BY MOUTH THREE TIMES A DAY   guaifenesin 100 MG/5ML syrup Commonly known as:  ROBITUSSIN Take 200 mg by mouth 3 (three) times daily as needed for cough.   hydrALAZINE 50 MG tablet Commonly known as:  APRESOLINE Take 1 tablet (50 mg total) by mouth 3 (three) times daily. What changed:  See the new instructions.   HYDROcodone-acetaminophen 10-325 MG tablet Commonly known as:  NORCO Take 1 tablet by mouth every 6 (six) hours as needed. What changed:  when to take this  reasons to take this   insulin NPH-regular Human (70-30) 100 UNIT/ML injection Commonly known  as:  NOVOLIN 70/30 Inject 40 units subcutaneous with breakfast and then take 25 units with dinner to control blood sugar.   ipratropium-albuterol 0.5-2.5 (3) MG/3ML Soln Commonly known as:  DUONEB Take 37mls by nebulization every 6 hours as needed for SOB/Wheezing   LINZESS 145 MCG Caps capsule Generic drug:  linaclotide Take 145 mcg by mouth 2 (two) times daily.   metoprolol succinate 25 MG 24 hr tablet Commonly known as:  TOPROL-XL TAKE 1 TABLET BY MOUTH EVERY DAY WITH OR IMMEDIATELY FOLLOWING A MEAL FOR BLOOD PRESSURE   NEXIUM 40 MG capsule Generic drug:  esomeprazole TAKE 1 CAPSULE BY MOUTH ONCE DAILY   potassium chloride SA 20 MEQ tablet Commonly known as:  K-DUR,KLOR-CON TAKE 1 TABLET BY MOUTH EVERY DAY   predniSONE 20 MG tablet Commonly known as:  DELTASONE Prednisone 20  mg daily for 3 days and stop.   sertraline 50 MG tablet Commonly known as:  ZOLOFT TAKE 1 TABLET BY MOUTH EVERY DAY.   simvastatin 20 MG tablet Commonly known as:  ZOCOR TAKE 1 TABLET BY MOUTH EVERY DAY   sucralfate 1 g tablet Commonly known as:  CARAFATE TAKE 1 TABLET BY MOUTH TWICE DAILY ON EMPTY STOMACH.   timolol 0.5 % ophthalmic solution Commonly known as:  TIMOPTIC INSTILL 1 DROP INTO EACH EYE TWICE A DAY   TRAVATAN Z 0.004 % Soln ophthalmic solution Generic drug:  Travoprost (BAK Free) Place 1 drop into both eyes at bedtime.   Vitamin D3 2000 units Tabs Take 2,000 Units by mouth daily.      Follow-up Information    [facility] .   Why:  [place], physical therapy,occupational therapy, social worker Contact information: [place] [address] [phone]          Allergies  Allergen Reactions  . Tramadol Other (See Comments)    Leg cramps   . Codeine Nausea And Vomiting    Patient states N/V with codeine  . Hydrocodone Nausea And Vomiting  . Oxycodone Other (See Comments)    Patient states she can tolerate oxycodone  . Tylenol  [Acetaminophen] Hives  . Penicillins Rash    Patient states rash/itch with penicillin Has patient had a PCN reaction causing immediate rash, facial/tongue/throat swelling, SOB or lightheadedness with hypotension: Yes- broke me out and i was itching Has patient had a PCN reaction causing severe rash involving mucus membranes or skin necrosis: No Has patient had a PCN reaction that required hospitalization Yes- i was already in the hospital Has patient had a PCN reaction occurring within the last 10 years: No If all of the above answers are "NO    Consultations: None.   Procedures/Studies: Dg Chest 2 View  Result Date: 09/24/2016 CLINICAL DATA:  Increase chest pressure EXAM: CHEST  2 VIEW COMPARISON:  September 14, 2016 FINDINGS: The mediastinal contour is normal. The heart size is mildly enlarged. The aorta is tortuous. There is mild central pulmonary vascular congestion. There is no focal pneumonia. There is a minimal left pleural effusion. The visualized skeletal structures are stable. IMPRESSION: Cardiomegaly with mild central pulmonary vascular congestion. Electronically Signed   By: Abelardo Diesel M.D.   On: 09/24/2016 19:38   Dg Chest 2 View  Result Date: 09/14/2016 CLINICAL DATA:  Cough, shortness of breath EXAM: CHEST  2 VIEW COMPARISON:  Chest radiograph 05/21/2016 FINDINGS: Heart remains enlarged with calcifications seen within the aortic arch. There is shallow lung inflation with central pulmonary vascular congestion. No overt pulmonary edema. No pneumothorax or sizable pleural effusion. No focal airspace consolidation. IMPRESSION: Cardiomegaly and aortic atherosclerosis. Central pulmonary vascular congestion without overt pulmonary edema. Electronically Signed   By: Ulyses Jarred M.D.   On: 09/14/2016 14:34   Dg Chest Port 1 View  Result Date: 09/26/2016 CLINICAL DATA:  Follow-up examination for progressive shortness of breath. History of COPD and CHF. EXAM: PORTABLE CHEST 1 VIEW  COMPARISON:  Prior radiograph from 09/24/2016. FINDINGS: Stable cardiomegaly.  Mediastinal silhouette within normal limits. Lungs normally inflated. Diffuse vascular congestion with fullness of the interstitial markings is slightly worsened from prior. Finding concerning for worsening pulmonary edema. No definite pleural effusion. No focal infiltrates. Atelectasis/scar noted along the right minor fissure, stable. No pneumothorax. No acute osseous abnormality. Severe degenerative osteoarthritic changes about the left shoulder noted. IMPRESSION: Cardiomegaly with slight  interval worsening in diffuse vascular congestion and interstitial prominence, concerning for worsening CHF/edema. Electronically Signed   By: Jeannine Boga M.D.   On: 09/26/2016 23:56       Subjective:  No chest pain or sob.  Discharge Exam: Vitals:   09/27/16 2052 09/28/16 0523  BP: (!) 118/46 (!) 152/77  Pulse: 65 66  Resp: 18 18  Temp: 98.3 F (36.8 C) 97.9 F (36.6 C)   Vitals:   09/27/16 2014 09/27/16 2052 09/28/16 0523 09/28/16 1058  BP:  (!) 118/46 (!) 152/77   Pulse:  65 66   Resp:  18 18   Temp:  98.3 F (36.8 C) 97.9 F (36.6 C)   TempSrc:  Oral Oral   SpO2: 97% 98% 100% 98%  Weight:   99.1 kg (218 lb 6.4 oz)   Height:        General: Pt is alert, awake, not in acute distress Cardiovascular: RRR, S1/S2 +, no rubs, no gallops Respiratory: CTA bilaterally, no wheezing, no rhonchi Abdominal: Soft, NT, ND, bowel sounds + Extremities: no edema, no cyanosis    The results of significant diagnostics from this hospitalization (including imaging, microbiology, ancillary and laboratory) are listed below for reference.     Microbiology: No results found for this or any previous visit (from the past 240 hour(s)).   Labs: BNP (last 3 results)  Recent Labs  01/01/16 1023 05/21/16 1623 09/24/16 1959  BNP 41.6 41.5 57.3   Basic Metabolic Panel:  Recent Labs Lab 09/24/16 1959 09/26/16 0541  09/27/16 0439 09/28/16 0422  NA 141 141 139 139  K 3.5 4.2 3.9 3.7  CL 106 105 103 101  CO2 25 27 27 31   GLUCOSE 52* 92 183* 161*  BUN 36* 26* 25* 26*  CREATININE 2.72* 2.45* 2.29* 2.48*  CALCIUM 9.4 8.9 9.1 9.1   Liver Function Tests: No results for input(s): AST, ALT, ALKPHOS, BILITOT, PROT, ALBUMIN in the last 168 hours. No results for input(s): LIPASE, AMYLASE in the last 168 hours. No results for input(s): AMMONIA in the last 168 hours. CBC:  Recent Labs Lab 09/24/16 1959 09/25/16 0359  WBC 9.0 6.7  NEUTROABS  --  3.4  HGB 10.8* 10.2*  HCT 34.3* 31.9*  MCV 81.1 80.8  PLT 282 234   Cardiac Enzymes:  Recent Labs Lab 09/24/16 1959 09/25/16 0359 09/25/16 0754  TROPONINI <0.03 <0.03 <0.03   BNP: Invalid input(s): POCBNP CBG:  Recent Labs Lab 09/27/16 1200 09/27/16 1645 09/27/16 2120 09/28/16 0748 09/28/16 1159  GLUCAP 128* 90 136* 116* 196*   D-Dimer No results for input(s): DDIMER in the last 72 hours. Hgb A1c No results for input(s): HGBA1C in the last 72 hours. Lipid Profile No results for input(s): CHOL, HDL, LDLCALC, TRIG, CHOLHDL, LDLDIRECT in the last 72 hours. Thyroid function studies No results for input(s): TSH, T4TOTAL, T3FREE, THYROIDAB in the last 72 hours.  Invalid input(s): FREET3 Anemia work up No results for input(s): VITAMINB12, FOLATE, FERRITIN, TIBC, IRON, RETICCTPCT in the last 72 hours. Urinalysis    Component Value Date/Time   COLORURINE YELLOW 12/28/2015 [place] 12/28/2015 1305   LABSPEC 1.007 12/28/2015 1305   PHURINE 5.0 12/28/2015 1305   GLUCOSEU NEGATIVE 12/28/2015 1305   HGBUR TRACE (A) 12/28/2015 1305   BILIRUBINUR NEGATIVE 12/28/2015 [place] 12/28/2015 1305   PROTEINUR NEGATIVE 12/28/2015 1305   UROBILINOGEN 0.2 02/14/2008 0947   NITRITE NEGATIVE 12/28/2015 1305   LEUKOCYTESUR NEGATIVE 12/28/2015 1305  Sepsis Labs Invalid input(s): PROCALCITONIN,  WBC,   LACTICIDVEN Microbiology No results found for this or any previous visit (from the past 240 hour(s)).   Time coordinating discharge: Over 30 minutes  SIGNED:   Hosie Poisson, MD  [facility] 09/29/2016, 10:50 AM Pager   If 7PM-7AM, please contact night-coverage [email or web address] Password [email or web address]

## 2016-09-30 ENCOUNTER — Telehealth: Payer: Self-pay

## 2016-10-01 ENCOUNTER — Other Ambulatory Visit: Payer: Self-pay

## 2016-10-01 NOTE — Telephone Encounter (Signed)
Transition Care Management Follow-Up Telephone Call   Date discharged and where: 09/24/16, [facility]  How have you been since you were released from the hospital? Pt states she is feeling better. Sister comes by and checks on her, gives out meds, etc.   Any patient concerns? N  Items Reviewed:   Meds: Y  Allergies: Y  Dietary Changes Reviewed: Diabetic Diet  Functional Questionnaire:  Independent-I Dependent-D  ADLs:   Dressing- I    Eating- I   Maintaining continence-I   Transferring-D   Transportation-D   Meal Prep-D   Managing Meds- D  Confirmed importance and Date/Time of follow-up visits scheduled: Appt on 10/07/16 @ 1:15pm.    Confirmed with patient if condition worsens to call PCP or go to the Emergency Dept. Patient was given office number and encouraged to call back with questions or concerns: Darreld Mclean

## 2016-10-02 ENCOUNTER — Encounter: Payer: Medicare Other | Admitting: Internal Medicine

## 2016-10-02 DIAGNOSIS — I130 Hypertensive heart and chronic kidney disease with heart failure and stage 1 through stage 4 chronic kidney disease, or unspecified chronic kidney disease: Secondary | ICD-10-CM | POA: Diagnosis not present

## 2016-10-02 DIAGNOSIS — E1151 Type 2 diabetes mellitus with diabetic peripheral angiopathy without gangrene: Secondary | ICD-10-CM | POA: Diagnosis not present

## 2016-10-02 DIAGNOSIS — I5033 Acute on chronic diastolic (congestive) heart failure: Secondary | ICD-10-CM | POA: Diagnosis not present

## 2016-10-02 DIAGNOSIS — E1122 Type 2 diabetes mellitus with diabetic chronic kidney disease: Secondary | ICD-10-CM | POA: Diagnosis not present

## 2016-10-02 DIAGNOSIS — J441 Chronic obstructive pulmonary disease with (acute) exacerbation: Secondary | ICD-10-CM | POA: Diagnosis not present

## 2016-10-02 DIAGNOSIS — N184 Chronic kidney disease, stage 4 (severe): Secondary | ICD-10-CM | POA: Diagnosis not present

## 2016-10-02 NOTE — Telephone Encounter (Signed)
Called and spoke to pt. Informed her of the recs per RA. Pt verbalized understanding and states [facility] called her and she returned their call and could not reach anyone. Called [facility] and was advised their computer system is down and to call back after 12. Will call [facility] back later today 10/02/16.

## 2016-10-06 ENCOUNTER — Encounter: Payer: Self-pay | Admitting: Nurse Practitioner

## 2016-10-06 ENCOUNTER — Ambulatory Visit (INDEPENDENT_AMBULATORY_CARE_PROVIDER_SITE_OTHER): Payer: Medicare Other | Admitting: Nurse Practitioner

## 2016-10-06 VITALS — BP 130/70 | HR 66 | Temp 97.8°F | Resp 18 | Ht 62.0 in | Wt 214.6 lb

## 2016-10-06 DIAGNOSIS — N184 Chronic kidney disease, stage 4 (severe): Secondary | ICD-10-CM

## 2016-10-06 DIAGNOSIS — I5033 Acute on chronic diastolic (congestive) heart failure: Secondary | ICD-10-CM | POA: Diagnosis not present

## 2016-10-06 DIAGNOSIS — D649 Anemia, unspecified: Secondary | ICD-10-CM

## 2016-10-06 DIAGNOSIS — J449 Chronic obstructive pulmonary disease, unspecified: Secondary | ICD-10-CM

## 2016-10-06 DIAGNOSIS — I5032 Chronic diastolic (congestive) heart failure: Secondary | ICD-10-CM

## 2016-10-06 DIAGNOSIS — E1122 Type 2 diabetes mellitus with diabetic chronic kidney disease: Secondary | ICD-10-CM | POA: Diagnosis not present

## 2016-10-06 DIAGNOSIS — E1151 Type 2 diabetes mellitus with diabetic peripheral angiopathy without gangrene: Secondary | ICD-10-CM | POA: Diagnosis not present

## 2016-10-06 DIAGNOSIS — I130 Hypertensive heart and chronic kidney disease with heart failure and stage 1 through stage 4 chronic kidney disease, or unspecified chronic kidney disease: Secondary | ICD-10-CM | POA: Diagnosis not present

## 2016-10-06 DIAGNOSIS — J441 Chronic obstructive pulmonary disease with (acute) exacerbation: Secondary | ICD-10-CM | POA: Diagnosis not present

## 2016-10-06 LAB — CBC WITH DIFFERENTIAL/PLATELET
BASOS ABS: 0 {cells}/uL (ref 0–200)
BASOS PCT: 0 %
EOS ABS: 194 {cells}/uL (ref 15–500)
Eosinophils Relative: 2 %
HCT: 32.3 % — ABNORMAL LOW (ref 35.0–45.0)
HEMOGLOBIN: 10.3 g/dL — AB (ref 11.7–15.5)
LYMPHS ABS: 2716 {cells}/uL (ref 850–3900)
Lymphocytes Relative: 28 %
MCH: 26 pg — AB (ref 27.0–33.0)
MCHC: 31.9 g/dL — ABNORMAL LOW (ref 32.0–36.0)
MCV: 81.6 fL (ref 80.0–100.0)
MONO ABS: 582 {cells}/uL (ref 200–950)
MONOS PCT: 6 %
MPV: 10.4 fL (ref 7.5–12.5)
NEUTROS ABS: 6208 {cells}/uL (ref 1500–7800)
Neutrophils Relative %: 64 %
PLATELETS: 252 10*3/uL (ref 140–400)
RBC: 3.96 MIL/uL (ref 3.80–5.10)
RDW: 17 % — ABNORMAL HIGH (ref 11.0–15.0)
WBC: 9.7 10*3/uL (ref 3.8–10.8)

## 2016-10-06 NOTE — Patient Instructions (Signed)
Cont lasix 40 mg twice daily, will get blood work today Please get chest xray to evaluate lungs and heart failure.  To make appt with nephrologist due to worsening renal function in hospital

## 2016-10-06 NOTE — Progress Notes (Signed)
Careteam: Patient Care Team: [name], NP as PCP - General (Nurse Practitioner) [name], MD as Consulting Physician (Ophthalmology) Loletta Specter, RN as [place] Management    Allergies  Allergen Reactions  . Tramadol Other (See Comments)    Leg cramps   . Codeine Nausea And Vomiting    Patient states N/V with codeine  . Hydrocodone Nausea And Vomiting  . Oxycodone Other (See Comments)    Patient states she can tolerate oxycodone  . Tylenol [Acetaminophen] Hives  . Penicillins Rash    Patient states rash/itch with penicillin Has patient had a PCN reaction causing immediate rash, facial/tongue/throat swelling, SOB or lightheadedness with hypotension: Yes- broke me out and i was itching Has patient had a PCN reaction causing severe rash involving mucus membranes or skin necrosis: No Has patient had a PCN reaction that required hospitalization Yes- i was already in the hospital Has patient had a PCN reaction occurring within the last 10 years: No If all of the above answers are "NO    Chief Complaint  Patient presents with  . Other    [place] stay 10/26 to 10/30     HPI: Patient is a 80 y.o. female seen in the office today for hospital follow up.  Pt with pmh of of COPD,chronic respiratory failure on 2 L 10/2, diastolic CHF last EF 65- 58%, DM type II CKD stage IV, anxiety, OSA on CPAP; who went to the ED with complaints of progressively worsening shortness of breath and chest pressure. Pt was treated with diastolic heart failure and she was diuresed. Lasix was increased to 40 mg bid. Renal function improved with diuresis, hospital recommending nephrology follow up.  Hospital records report Chest pressure resolved with treatment of CHF, neg cardiac enzymes. However pt reports this never improved. She reports more of a pain. Deep breathing helps the pain. Notices worsening chest pain when she is short of breath.  After  hospitalization she was placed on O2 around the clock and that helps the pain. She is not currently on Oxygen in office because she does not have portable O2. O2 in office is 96%   Breathing treatments helping a lot, does not wheeze as much.  Working with [place] to get this.  Anemia was stable around 10.  Pt had [facility] referral.  Also recommended to see cardiologist- she has not seen in ~1 year, has not followed up.  Previously saw Dr Skeet Latch at [facility] but has not gone back.    Review of Systems:  Review of Systems  Constitutional: Positive for activity change and fatigue.  Respiratory: Positive for cough and shortness of breath.   Cardiovascular: Positive for chest pain. Negative for palpitations.  Gastrointestinal: Negative for abdominal distention and constipation.  Musculoskeletal: Positive for arthralgias, gait problem and joint swelling.  Neurological: Positive for weakness and numbness.  All other systems reviewed and are negative.   Past Medical History:  Diagnosis Date  . Acute bronchitis   . Anxiety   . Arthritis   . Chronic kidney disease, stage II (mild)   . Complications affecting other specified body systems, hypertension   . Congestive heart failure, unspecified    stress test in March at [facility]  . COPD (chronic obstructive pulmonary disease) ([place])    present for several years, diagnosed in the last few years  . Degenerative arthritis of knee, bilateral   . Diabetes mellitus   . Diaphragmatic hernia  without mention of obstruction or gangrene   . Disorder of bone and cartilage, unspecified   . GERD (gastroesophageal reflux disease)   . Hiatal hernia   . Hyperlipidemia   . Hypertension   . Hypopotassemia   . Insomnia, unspecified   . Lumbago   . Obese   . Osteoarthrosis, unspecified whether generalized or localized, unspecified site   . PONV (postoperative nausea and vomiting)   . Scoliosis   . Secondary diabetes mellitus  with renal manifestations, not stated as uncontrolled, or unspecified(249.40)   . Unspecified glaucoma(365.9)   . Unspecified hereditary and idiopathic peripheral neuropathy    Past Surgical History:  Procedure Laterality Date  . ABDOMINAL HYSTERECTOMY    . COLONOSCOPY  08/06/2011  . FOOT SURGERY    . KNEE ARTHROSCOPY     bilateral  . SHOULDER SURGERY    . TOTAL HIP ARTHROPLASTY     bilateral   Social History:   reports that she quit smoking about 19 years ago. Her smoking use included Cigarettes. She has a 1.50 pack-year smoking history. She has never used smokeless tobacco. She reports that she does not drink alcohol or use drugs.  Family History  Problem Relation Age of Onset  . Diabetes Mother   . Heart disease Mother   . Heart disease Brother   . Diabetes Brother   . Kidney disease Father   . Scoliosis Sister   . Diabetes Brother   . Diabetes Brother   . Heart disease Brother   . Scoliosis Brother   . Stroke Brother   . Heart attack Brother     Medications: Patient's Medications  New Prescriptions   No medications on file  Previous Medications   ALBUTEROL (PROVENTIL HFA;VENTOLIN HFA) 108 (90 BASE) MCG/ACT INHALER    Inhale 2 puffs into the lungs every 4 (four) hours as needed for wheezing or shortness of breath.   AMITIZA 24 MCG CAPSULE    TAKE 1 CAPSULE BY MOUTH TWICE DAILY WITH MEALS   ASPIRIN EC 81 MG TABLET    Take 81 mg by mouth daily.   BUDESONIDE (PULMICORT) 0.25 MG/2ML NEBULIZER SOLUTION    Take one ampule in Neb twice daily routinely for COPD  J44.1   CHOLECALCIFEROL (VITAMIN D3) 2000 UNITS TABS    Take 2,000 Units by mouth daily.   CILOSTAZOL (PLETAL) 100 MG TABLET    TAKE 1 TABLET BY MOUTH EVERY DAY.   CYCLOSPORINE (RESTASIS) 0.05 % OPHTHALMIC EMULSION    Place 1 drop into both eyes 2 (two) times daily. One drop once a day bilaterally for dry eyes   DICLOFENAC SODIUM (VOLTAREN) 1 % GEL    APPLY 4 GRAM TOPICALLY TWICE DAILY as needed for pain   DULOXETINE  (CYMBALTA) 30 MG CAPSULE    TAKE 1 CAPSULE BY MOUTH EVERY DAY TO HELP ANXIETY AND PAINS.   EASY TOUCH INSULIN SYRINGE 31G X 5/16" 0.5 ML MISC    USE UP TO 2 TIMES A DAY AS DIRECTED.   ESOMEPRAZOLE (NEXIUM) 40 MG CAPSULE    TAKE 1 CAPSULE BY MOUTH ONCE DAILY.   FUROSEMIDE (LASIX) 40 MG TABLET    Take 1 tablet by mouth 2 (two) times daily.   GABAPENTIN (NEURONTIN) 100 MG CAPSULE    TAKE 2 CAPSULES BY MOUTH THREE TIMES A DAY   GUAIFENESIN (ROBITUSSIN) 100 MG/5ML SYRUP    Take 200 mg by mouth 3 (three) times daily as needed for cough.   HYDRALAZINE (APRESOLINE) 50 MG TABLET  Take 1 tablet (50 mg total) by mouth 3 (three) times daily.   HYDROCODONE-ACETAMINOPHEN (NORCO) 10-325 MG TABLET    Take 1 tablet by mouth every 6 (six) hours as needed.   INSULIN NPH-REGULAR HUMAN (NOVOLIN 70/30) (70-30) 100 UNIT/ML INJECTION    Inject 40 units subcutaneous with breakfast and then take 25 units with dinner to control blood sugar.   IPRATROPIUM-ALBUTEROL (DUONEB) 0.5-2.5 (3) MG/3ML SOLN    Take 97ms by nebulization every 6 hours as needed for SOB/Wheezing   LINACLOTIDE (LINZESS) 145 MCG CAPS CAPSULE    Take 145 mcg by mouth 2 (two) times daily.   METOPROLOL SUCCINATE (TOPROL-XL) 25 MG 24 HR TABLET    TAKE 1 TABLET BY MOUTH EVERY DAY WITH OR IMMEDIATELY FOLLOWING A MEAL FOR BLOOD PRESSURE.   POTASSIUM CHLORIDE SA (K-DUR,KLOR-CON) 20 MEQ TABLET    TAKE 1 TABLET BY MOUTH EVERY DAY   SERTRALINE (ZOLOFT) 50 MG TABLET    TAKE 1 TABLET BY MOUTH EVERY DAY.   SIMVASTATIN (ZOCOR) 20 MG TABLET    TAKE 1 TABLET BY MOUTH EVERY DAY.   SUCRALFATE (CARAFATE) 1 G TABLET    TAKE 1 TABLET BY MOUTH TWICE DAILY ON EMPTY STOMACH.   TIMOLOL (TIMOPTIC) 0.5 % OPHTHALMIC SOLUTION    INSTILL 1 DROP INTO EACH EYE TWICE A DAY   TRAVATAN Z 0.004 % SOLN OPHTHALMIC SOLUTION    Place 1 drop into both eyes at bedtime.   Modified Medications   No medications on file  Discontinued Medications   CILOSTAZOL (PLETAL) 100 MG TABLET    Take 100 mg by  mouth daily.   PREDNISONE (DELTASONE) 20 MG TABLET    Prednisone 20 mg daily for 3 days and stop.     Physical Exam:  Vitals:   10/06/16 1331  BP: 130/70  Pulse: 66  Resp: 18  Temp: 97.8 F (36.6 C)  TempSrc: Oral  SpO2: 96%  Weight: 214 lb 9.6 oz (97.3 kg)  Height: '5\' 2"'$  (1.575 m)   Body mass index is 39.25 kg/m.  Physical Exam  Constitutional: She is oriented to person, place, and time. She appears well-developed and well-nourished.  HENT:  Mouth/Throat: Oropharynx is clear and moist. No oropharyngeal exudate.  Eyes: Pupils are equal, round, and reactive to light.  Neck: Neck supple. Carotid bruit is not present.  Cardiovascular: Normal rate, regular rhythm and intact distal pulses.  Exam reveals no gallop and no friction rub.   Murmur (1/6 SEM) heard. Pulmonary/Chest: No respiratory distress. She has decreased breath sounds. She has no wheezes. She has no rales.  Diminished throughout  Abdominal: Soft. Bowel sounds are normal. There is no hepatomegaly.  Musculoskeletal: She exhibits edema.  Uses rolling walker; trace edema  Neurological: She is alert and oriented to person, place, and time.  Skin: Skin is warm and dry.  Psychiatric: She has a normal mood and affect. Her behavior is normal. Thought content normal.    Labs reviewed: Basic Metabolic Panel:  Recent Labs  10/29/15 1612  12/05/15 0247  12/08/15 1433  01/01/16 1246  09/26/16 0541 09/27/16 0439 09/28/16 0422  NA  --   < >  --   < >  --   < >  --   < > 141 139 139  K  --   < >  --   < >  --   < >  --   < > 4.2 3.9 3.7  CL  --   < >  --   < >  --   < >  --   < >  105 103 101  CO2  --   < >  --   < >  --   < >  --   < > '27 27 31  '$ GLUCOSE  --   < >  --   < >  --   < >  --   < > 92 183* 161*  BUN  --   < >  --   < >  --   < >  --   < > 26* 25* 26*  CREATININE  --   < >  --   < >  --   < > 1.83*  < > 2.45* 2.29* 2.48*  CALCIUM  --   < >  --   < >  --   < >  --   < > 8.9 9.1 9.1  MG 1.5*  --  1.9  --    --   --  2.3  --   --   --   --   TSH  --   --   --   --  2.584  --  2.213  --   --   --   --   < > = values in this interval not displayed. Liver Function Tests:  Recent Labs  02/26/16 1645 06/08/16 0958 09/14/16 1108  AST '20 14 13  '$ ALT '7 7 6  '$ ALKPHOS 105 78 95  BILITOT <0.2 0.3 0.3  PROT 6.8 6.2 6.8  ALBUMIN 4.1 3.5* 3.7   No results for input(s): LIPASE, AMYLASE in the last 8760 hours. No results for input(s): AMMONIA in the last 8760 hours. CBC:  Recent Labs  06/08/16 0958 09/14/16 1108 09/24/16 1959 09/25/16 0359  WBC 7.0 7.0 9.0 6.7  NEUTROABS 4,620 3,780  --  3.4  HGB 10.2* 9.9* 10.8* 10.2*  HCT 31.7* 31.5* 34.3* 31.9*  MCV 81.5 81.4 81.1 80.8  PLT 222 272 282 234   Lipid Panel: No results for input(s): CHOL, HDL, LDLCALC, TRIG, CHOLHDL, LDLDIRECT in the last 8760 hours. TSH:  Recent Labs  12/08/15 1433 01/01/16 1246  TSH 2.584 2.213   A1C: Lab Results  Component Value Date   HGBA1C 6.5 (H) 09/14/2016     Assessment/Plan 1. Chronic diastolic congestive heart failure (HCC) -no increase in edema. Pt reports worsening shortness of breath WITHOUT O2, she does not have portable Oxygen so she is having to travel without it. Reports chest discomforts associated with this. Overall better since hospitalization but still present.  - Ambulatory referral to [facility]- pt reports she is having a difficult time getting her portable O2, care referral placed to help with this as well as other care needs  - DG Chest 2 View to follow up CHF -Appt Made with Dr Skeet Latch for [facility] [place] follow up.   2. Chronic kidney disease (CKD), stage IV (severe) ([place]) Needs to follow up with nephrologist due to progressive renal disease.  - BMP with eGFR  3. Anemia, unspecified type - CBC with Differential/Platelets  4. Chronic obstructive pulmonary disease, unspecified COPD type ([place]) Wheezing has improved with current regimen, conts on budesonide BID and duoneb q  6 PRN.    Carlos American. Harle Battiest  [facility] [phone] 8 am - 5 pm) [phone] (after hours)

## 2016-10-06 NOTE — Telephone Encounter (Signed)
Spoke with Jeani Hawking at [facility]; pt was scheduled for CPAP set up-patient cancelled appt as she needed RT to go to her home to set up CPAP-appt was made for that and then the daughter cancelled that appt stating they would call [facility]PS back to [facility].   LMTCB-pt will need to call APS to get scheduled for CPAP set up.

## 2016-10-07 ENCOUNTER — Ambulatory Visit: Payer: Self-pay | Admitting: Cardiovascular Disease

## 2016-10-07 ENCOUNTER — Ambulatory Visit
Admission: RE | Admit: 2016-10-07 | Discharge: 2016-10-07 | Disposition: A | Discharge status: Home / Self Care | Payer: Medicare Other | Source: Ambulatory Visit | Attending: Nurse Practitioner | Admitting: Nurse Practitioner

## 2016-10-07 DIAGNOSIS — J441 Chronic obstructive pulmonary disease with (acute) exacerbation: Secondary | ICD-10-CM | POA: Diagnosis not present

## 2016-10-07 DIAGNOSIS — E1151 Type 2 diabetes mellitus with diabetic peripheral angiopathy without gangrene: Secondary | ICD-10-CM | POA: Diagnosis not present

## 2016-10-07 DIAGNOSIS — I130 Hypertensive heart and chronic kidney disease with heart failure and stage 1 through stage 4 chronic kidney disease, or unspecified chronic kidney disease: Secondary | ICD-10-CM | POA: Diagnosis not present

## 2016-10-07 DIAGNOSIS — N184 Chronic kidney disease, stage 4 (severe): Secondary | ICD-10-CM | POA: Diagnosis not present

## 2016-10-07 DIAGNOSIS — E1122 Type 2 diabetes mellitus with diabetic chronic kidney disease: Secondary | ICD-10-CM | POA: Diagnosis not present

## 2016-10-07 DIAGNOSIS — I5033 Acute on chronic diastolic (congestive) heart failure: Secondary | ICD-10-CM | POA: Diagnosis not present

## 2016-10-07 DIAGNOSIS — R0602 Shortness of breath: Secondary | ICD-10-CM | POA: Diagnosis not present

## 2016-10-07 LAB — BASIC METABOLIC PANEL WITH GFR
BUN: 46 mg/dL — AB (ref 7–25)
CHLORIDE: 96 mmol/L — AB (ref 98–110)
CO2: 28 mmol/L (ref 20–31)
Calcium: 9.1 mg/dL (ref 8.6–10.4)
Creat: 2.78 mg/dL — ABNORMAL HIGH (ref 0.60–0.93)
GFR, Est African American: 18 mL/min — ABNORMAL LOW (ref >=60)
GFR, Est Non African American: 16 mL/min — ABNORMAL LOW (ref >=60)
Glucose, Bld: 206 mg/dL — ABNORMAL HIGH (ref 65–99)
POTASSIUM: 4.1 mmol/L (ref 3.5–5.3)
Sodium: 135 mmol/L (ref 135–146)

## 2016-10-09 DIAGNOSIS — E1122 Type 2 diabetes mellitus with diabetic chronic kidney disease: Secondary | ICD-10-CM | POA: Diagnosis not present

## 2016-10-09 DIAGNOSIS — N184 Chronic kidney disease, stage 4 (severe): Secondary | ICD-10-CM | POA: Diagnosis not present

## 2016-10-09 DIAGNOSIS — J441 Chronic obstructive pulmonary disease with (acute) exacerbation: Secondary | ICD-10-CM | POA: Diagnosis not present

## 2016-10-09 DIAGNOSIS — I5033 Acute on chronic diastolic (congestive) heart failure: Secondary | ICD-10-CM | POA: Diagnosis not present

## 2016-10-09 DIAGNOSIS — I130 Hypertensive heart and chronic kidney disease with heart failure and stage 1 through stage 4 chronic kidney disease, or unspecified chronic kidney disease: Secondary | ICD-10-CM | POA: Diagnosis not present

## 2016-10-09 DIAGNOSIS — E1151 Type 2 diabetes mellitus with diabetic peripheral angiopathy without gangrene: Secondary | ICD-10-CM | POA: Diagnosis not present

## 2016-10-09 NOTE — Telephone Encounter (Signed)
Called and spoke to pt. Informed her that she will need to call APS and schedule an appt to set up CPAP. APS's number given to pt. Pt verbalized understanding and denied any further questions or concerns at this time.

## 2016-10-10 DIAGNOSIS — I5033 Acute on chronic diastolic (congestive) heart failure: Secondary | ICD-10-CM | POA: Diagnosis not present

## 2016-10-10 DIAGNOSIS — N184 Chronic kidney disease, stage 4 (severe): Secondary | ICD-10-CM | POA: Diagnosis not present

## 2016-10-10 DIAGNOSIS — I130 Hypertensive heart and chronic kidney disease with heart failure and stage 1 through stage 4 chronic kidney disease, or unspecified chronic kidney disease: Secondary | ICD-10-CM | POA: Diagnosis not present

## 2016-10-10 DIAGNOSIS — J441 Chronic obstructive pulmonary disease with (acute) exacerbation: Secondary | ICD-10-CM | POA: Diagnosis not present

## 2016-10-10 DIAGNOSIS — E1122 Type 2 diabetes mellitus with diabetic chronic kidney disease: Secondary | ICD-10-CM | POA: Diagnosis not present

## 2016-10-10 DIAGNOSIS — E1151 Type 2 diabetes mellitus with diabetic peripheral angiopathy without gangrene: Secondary | ICD-10-CM | POA: Diagnosis not present

## 2016-10-13 ENCOUNTER — Other Ambulatory Visit: Payer: Self-pay

## 2016-10-13 ENCOUNTER — Telehealth: Payer: Self-pay

## 2016-10-13 DIAGNOSIS — E1122 Type 2 diabetes mellitus with diabetic chronic kidney disease: Secondary | ICD-10-CM | POA: Diagnosis not present

## 2016-10-13 DIAGNOSIS — N184 Chronic kidney disease, stage 4 (severe): Secondary | ICD-10-CM | POA: Diagnosis not present

## 2016-10-13 DIAGNOSIS — I5033 Acute on chronic diastolic (congestive) heart failure: Secondary | ICD-10-CM | POA: Diagnosis not present

## 2016-10-13 DIAGNOSIS — J441 Chronic obstructive pulmonary disease with (acute) exacerbation: Secondary | ICD-10-CM | POA: Diagnosis not present

## 2016-10-13 DIAGNOSIS — I130 Hypertensive heart and chronic kidney disease with heart failure and stage 1 through stage 4 chronic kidney disease, or unspecified chronic kidney disease: Secondary | ICD-10-CM | POA: Diagnosis not present

## 2016-10-13 DIAGNOSIS — E1151 Type 2 diabetes mellitus with diabetic peripheral angiopathy without gangrene: Secondary | ICD-10-CM | POA: Diagnosis not present

## 2016-10-13 NOTE — Telephone Encounter (Signed)
Order faxed on 10/13/16 to [facility] for patient to receive portable O2 per pt's PCP, [name].

## 2016-10-14 DIAGNOSIS — I130 Hypertensive heart and chronic kidney disease with heart failure and stage 1 through stage 4 chronic kidney disease, or unspecified chronic kidney disease: Secondary | ICD-10-CM | POA: Diagnosis not present

## 2016-10-14 DIAGNOSIS — E1151 Type 2 diabetes mellitus with diabetic peripheral angiopathy without gangrene: Secondary | ICD-10-CM | POA: Diagnosis not present

## 2016-10-14 DIAGNOSIS — E1122 Type 2 diabetes mellitus with diabetic chronic kidney disease: Secondary | ICD-10-CM | POA: Diagnosis not present

## 2016-10-14 DIAGNOSIS — J441 Chronic obstructive pulmonary disease with (acute) exacerbation: Secondary | ICD-10-CM | POA: Diagnosis not present

## 2016-10-14 DIAGNOSIS — I5033 Acute on chronic diastolic (congestive) heart failure: Secondary | ICD-10-CM | POA: Diagnosis not present

## 2016-10-14 DIAGNOSIS — N184 Chronic kidney disease, stage 4 (severe): Secondary | ICD-10-CM | POA: Diagnosis not present

## 2016-10-14 NOTE — Telephone Encounter (Signed)
Michelle Buck with [facility] Called and [facility] to return call regarding patient. I called and receptionist stated Michelle Buck was on the phone with [facility] and took name and number and stated that she would have her to call back.

## 2016-10-15 DIAGNOSIS — N184 Chronic kidney disease, stage 4 (severe): Secondary | ICD-10-CM | POA: Diagnosis not present

## 2016-10-15 DIAGNOSIS — E1122 Type 2 diabetes mellitus with diabetic chronic kidney disease: Secondary | ICD-10-CM | POA: Diagnosis not present

## 2016-10-15 DIAGNOSIS — J441 Chronic obstructive pulmonary disease with (acute) exacerbation: Secondary | ICD-10-CM | POA: Diagnosis not present

## 2016-10-15 DIAGNOSIS — I130 Hypertensive heart and chronic kidney disease with heart failure and stage 1 through stage 4 chronic kidney disease, or unspecified chronic kidney disease: Secondary | ICD-10-CM | POA: Diagnosis not present

## 2016-10-15 DIAGNOSIS — I5033 Acute on chronic diastolic (congestive) heart failure: Secondary | ICD-10-CM | POA: Diagnosis not present

## 2016-10-15 DIAGNOSIS — E1151 Type 2 diabetes mellitus with diabetic peripheral angiopathy without gangrene: Secondary | ICD-10-CM | POA: Diagnosis not present

## 2016-10-16 ENCOUNTER — Other Ambulatory Visit: Payer: Self-pay

## 2016-10-16 NOTE — Patient Outreach (Signed)
[facility] [facility]) Care Management  10/16/16  Kaisy Severino 1936-03-01 [id number]  Successful outreach completed with patient. Patient identification verified. Call kept short as patient stated she was not really feeling well today. Patient stated that she has been having headaches off and on all day. Also reports some chest pain that she has had since 4 pm. Patient denies any shortness of breath. RNCM educated patient that if she is complaining of chest pain, she should call 911. Patient stated she will be ok and will call if it worsens.  Patient stated that she did check her blood pressure this morning. Reports it was 138/79. RNCM encouraged patient to check it when she is having headaches to see if her blood pressure is elevated.  Patient reports she does have some swelling in her feet, but not in her hands. Stated that her weight today was 218 and weight yesterday was 217. (Her weight on 10/06/2016 at office visit was 214 lbs 9.6 oz).   RNCM educated patient on importance of calling 911 for chest pain with or without shortness of breath and patient verbalized understanding.  She is agreeable to a home visit in next couple of weeks.   RNCM encouraged patient to call with any needs or concerns.  [place] R. Kamalei Roeder, RN, BSN, [place] Management Coordinator [phone]

## 2016-10-16 NOTE — Patient Outreach (Signed)
[place] [facility]) Care Management  Late entry for 10/01/2016  Michelle Buck 04-13-1936 [id number]  Subjective: Successful outreach completed with patient. Patient identification verified.  Objective: Per records review, patient was admitted 09/24/2016 - [id number]/09/[id number] for diastolic heart failure. She had presented with complaints of "progressively worsening shortness of breath and chest pressure over the last 3-4 weeks, worsened over the last two days."  Assessment: Patient alert and oriented. She reported that she went to the hospital because her chest was hurting and she could not breathe. She said that she had heart failure. She also reported that her kidneys were acting up a little bit. She stated that she has been ok since she was at home. She currently denies any chest pain or shortness of breath.   Patient stated that she did receive her discharge instructions, and has several medication changes. She stated that she has several medication she was supposed to stop and she has done that. However, she has not yet got her new medicine (prednisone). She said that it was called in out of town and it will be there at her home tomorrow.   Patient stated that she does have home health, but cannot remember the name of the agency. She reported that a nurse has already been out to see her.   Patient reported that she has a follow up appointment scheduled and her sister or her brother will be able to help her get to her appointments.   RNCM provided education about signs and symptoms to look for regarding heart failure and when to call the doctor/911. Patient stated that she does weigh herself daily at the same time, first thing in the morning. She also stated that she writes it down. Encouraged to monitor for weight gain of 3 lbs in one day or 5 lbs in one week and educated about the importance of calling her doctor if she experiences this. Patient verbalized understanding. Also encouraged to  report shortness of breath, swelling in her hands, feet and ankles.   Plan: Patient has no other concerns at present. Agreeable to continued outreach. Will continue to follow for transition of care and monitor/educate for heart failure.  [place] R. Anahid Eskelson, RN, BSN, [place] Management Coordinator [phone]

## 2016-10-20 DIAGNOSIS — I5033 Acute on chronic diastolic (congestive) heart failure: Secondary | ICD-10-CM | POA: Diagnosis not present

## 2016-10-20 DIAGNOSIS — J441 Chronic obstructive pulmonary disease with (acute) exacerbation: Secondary | ICD-10-CM | POA: Diagnosis not present

## 2016-10-20 DIAGNOSIS — E1122 Type 2 diabetes mellitus with diabetic chronic kidney disease: Secondary | ICD-10-CM | POA: Diagnosis not present

## 2016-10-20 DIAGNOSIS — I130 Hypertensive heart and chronic kidney disease with heart failure and stage 1 through stage 4 chronic kidney disease, or unspecified chronic kidney disease: Secondary | ICD-10-CM | POA: Diagnosis not present

## 2016-10-20 DIAGNOSIS — N184 Chronic kidney disease, stage 4 (severe): Secondary | ICD-10-CM | POA: Diagnosis not present

## 2016-10-20 DIAGNOSIS — E1151 Type 2 diabetes mellitus with diabetic peripheral angiopathy without gangrene: Secondary | ICD-10-CM | POA: Diagnosis not present

## 2016-10-21 ENCOUNTER — Encounter: Payer: Self-pay | Admitting: *Deleted

## 2016-10-21 ENCOUNTER — Other Ambulatory Visit: Payer: Self-pay | Admitting: Nurse Practitioner

## 2016-10-21 DIAGNOSIS — N184 Chronic kidney disease, stage 4 (severe): Secondary | ICD-10-CM | POA: Diagnosis not present

## 2016-10-21 DIAGNOSIS — I5033 Acute on chronic diastolic (congestive) heart failure: Secondary | ICD-10-CM | POA: Diagnosis not present

## 2016-10-21 DIAGNOSIS — J441 Chronic obstructive pulmonary disease with (acute) exacerbation: Secondary | ICD-10-CM | POA: Diagnosis not present

## 2016-10-21 DIAGNOSIS — I130 Hypertensive heart and chronic kidney disease with heart failure and stage 1 through stage 4 chronic kidney disease, or unspecified chronic kidney disease: Secondary | ICD-10-CM | POA: Diagnosis not present

## 2016-10-21 DIAGNOSIS — E1122 Type 2 diabetes mellitus with diabetic chronic kidney disease: Secondary | ICD-10-CM | POA: Diagnosis not present

## 2016-10-21 DIAGNOSIS — E1151 Type 2 diabetes mellitus with diabetic peripheral angiopathy without gangrene: Secondary | ICD-10-CM | POA: Diagnosis not present

## 2016-10-25 NOTE — Progress Notes (Signed)
Cardiology Office Note   Date:  10/26/2016   ID:  Michelle Buck, DOB [birth year], MRN [id number]  PCP:  [name], NP  Cardiologist:   Skeet Latch, MD   Chief Complaint  Patient presents with  . Follow-up    pt c/o sob, diziness and fluid around her heart     History of Present Illness: Michelle Buck is a 80 y.o. female ith hypertension, diabetes, COPD, CKD IV, chronic diastolic heart failure who presents for follow up.  Michelle Buck was admitted to the hospital from 08/2015 with hypoxia thought to be due to acute on chronic diastolic heart failure.  She initially went to the hospital because her home health nurse noted lower extremity edema and shortness of breath..  She was diuresed with Lasix IV and discharged with lasix 40mg  po bid.  Her discharge weight was 103 kg.  at her follow-up appointment she reported chest pain and shortness breath. She was referred for a Lexiscan Myoview 12/20/15 that was negative for ischemia and revealed LVEF 69%.  Since that appointment Michelle Buck was admitted 08/2016 with a heart failure exacerbation.  Echo that admission revealed LVEF 60-65% with grade 2 diastolic dysfunction.  Since being discharged Michelle Buck has been feeling weak and dizzy.  She reports dizzines upon standing and fell once when trying to get up and answer her front door.  She has been weighing herself daily and has ranged from 217-219.  She was 218 at discharge.  She notes mild lower extremity edema but denies orthopnea.  She has been on oxygen since discharge and is unable to sleep with her CPAP machine.  She denies chest pain and notes that her breathing is better since she has been on oxygen.   Past Medical History:  Diagnosis Date  . Acute bronchitis   . Anxiety   . Arthritis   . Chronic kidney disease, stage II (mild)   . Complications affecting other specified body systems, hypertension   . Congestive heart failure, unspecified    stress test in March at [facility]  Cardiology  . COPD (chronic obstructive pulmonary disease) ([place])    present for several years, diagnosed in the last few years  . Degenerative arthritis of knee, bilateral   . Diabetes mellitus   . Diaphragmatic hernia without mention of obstruction or gangrene   . Disorder of bone and cartilage, unspecified   . GERD (gastroesophageal reflux disease)   . Hiatal hernia   . Hyperlipidemia   . Hypertension   . Hypopotassemia   . Insomnia, unspecified   . Lumbago   . Obese   . Osteoarthrosis, unspecified whether generalized or localized, unspecified site   . PONV (postoperative nausea and vomiting)   . Scoliosis   . Secondary diabetes mellitus with renal manifestations, not stated as uncontrolled, or unspecified(249.40)   . Unspecified glaucoma(365.9)   . Unspecified hereditary and idiopathic peripheral neuropathy     Past Surgical History:  Procedure Laterality Date  . ABDOMINAL HYSTERECTOMY    . COLONOSCOPY  08/06/2011  . FOOT SURGERY    . KNEE ARTHROSCOPY     bilateral  . SHOULDER SURGERY    . TOTAL HIP ARTHROPLASTY     bilateral     Current Outpatient Prescriptions  Medication Sig Dispense Refill  . albuterol (PROVENTIL HFA;VENTOLIN HFA) 108 (90 Base) MCG/ACT inhaler Inhale 2 puffs into the lungs every 4 (four) hours as needed for wheezing or shortness of breath.    . AMITIZA 24 MCG  capsule TAKE 1 CAPSULE BY MOUTH TWICE DAILY WITH MEALS 60 capsule 5  . aspirin EC 81 MG tablet Take 81 mg by mouth daily.    . budesonide (PULMICORT) 0.25 MG/2ML nebulizer solution Take one ampule in Neb twice daily routinely for COPD  J44.1 120 mL 6  . Cholecalciferol (VITAMIN D3) 2000 units TABS Take 2,000 Units by mouth daily.    . cilostazol (PLETAL) 100 MG tablet TAKE 1 TABLET BY MOUTH EVERY DAY. 30 tablet 4  . cycloSPORINE (RESTASIS) 0.05 % ophthalmic emulsion Place 1 drop into both eyes 2 (two) times daily. One drop once a day bilaterally for dry eyes    . diclofenac sodium (VOLTAREN) 1 %  GEL APPLY 4 GRAM TOPICALLY TWICE DAILY as needed for pain 200 g 6  . DULoxetine (CYMBALTA) 30 MG capsule TAKE 1 CAPSULE BY MOUTH EVERY DAY TO HELP ANXIETY AND PAINS. 30 capsule 4  . EASY TOUCH INSULIN SYRINGE 31G X 5/16" 0.5 ML MISC USE UP TO 2 TIMES A DAY AS DIRECTED. 100 each 11  . esomeprazole (NEXIUM) 40 MG capsule TAKE 1 CAPSULE BY MOUTH ONCE DAILY. 30 capsule 4  . furosemide (LASIX) 40 MG tablet Take 1 tablet by mouth 2 (two) times daily.    Marland Kitchen gabapentin (NEURONTIN) 100 MG capsule TAKE 2 CAPSULES BY MOUTH THREE TIMES A DAY 180 capsule 5  . guaifenesin (ROBITUSSIN) 100 MG/5ML syrup Take 200 mg by mouth 3 (three) times daily as needed for cough.    . hydrALAZINE (APRESOLINE) 50 MG tablet Take 1 tablet (50 mg total) by mouth 3 (three) times daily. 90 tablet 0  . HYDROcodone-acetaminophen (NORCO) 10-325 MG tablet Take 1 tablet by mouth every 6 (six) hours as needed. 10 tablet 0  . insulin NPH-regular Human (NOVOLIN 70/30) (70-30) 100 UNIT/ML injection Inject 40 units subcutaneous with breakfast and then take 25 units with dinner to control blood sugar. 10 mL 3  . ipratropium-albuterol (DUONEB) 0.5-2.5 (3) MG/3ML SOLN Take 59mls by nebulization every 6 hours as needed for SOB/Wheezing 360 mL 3  . linaclotide (LINZESS) 145 MCG CAPS capsule Take 145 mcg by mouth 2 (two) times daily.    . metoprolol succinate (TOPROL-XL) 25 MG 24 hr tablet TAKE 1 TABLET BY MOUTH EVERY DAY WITH OR IMMEDIATELY FOLLOWING A MEAL FOR BLOOD PRESSURE. 30 tablet 4  . potassium chloride SA (K-DUR,KLOR-CON) 20 MEQ tablet TAKE 1 TABLET BY MOUTH EVERY DAY 30 tablet 3  . sertraline (ZOLOFT) 50 MG tablet TAKE 1 TABLET BY MOUTH EVERY DAY. 30 tablet 3  . simvastatin (ZOCOR) 20 MG tablet TAKE 1 TABLET BY MOUTH EVERY DAY. 30 tablet 4  . sucralfate (CARAFATE) 1 g tablet TAKE 1 TABLET BY MOUTH TWICE DAILY ON EMPTY STOMACH. 60 tablet 2  . timolol (TIMOPTIC) 0.5 % ophthalmic solution INSTILL 1 DROP INTO EACH EYE TWICE A DAY 5 mL 4  .  TRAVATAN Z 0.004 % SOLN ophthalmic solution Place 1 drop into both eyes at bedtime.      No current facility-administered medications for this visit.     Allergies:   Tramadol; Codeine; Hydrocodone; Penicillins; and Tylenol [acetaminophen]    Social History:  The patient  reports that she quit smoking about 19 years ago. Her smoking use included Cigarettes. She has a 1.50 pack-year smoking history. She has never used smokeless tobacco. She reports that she does not drink alcohol or use drugs.   Family History:  The patient's family history includes Diabetes in her brother, brother,  brother, and mother; Heart attack in her brother; Heart disease in her brother, brother, and mother; Kidney disease in her father; Scoliosis in her brother and sister; Stroke in her brother.    ROS:  Please see the history of present illness.   Otherwise, review of systems are positive for none.   All other systems are reviewed and negative.    PHYSICAL EXAM: VS:  BP (!) 146/66 (BP Location: Right Arm, Patient Position: Sitting, Cuff Size: Large)   Pulse 68   Ht 5\' 2"  (1.575 m)   Wt 99.3 kg (219 lb)   SpO2 98%   BMI 40.06 kg/m  , BMI Body mass index is 40.06 kg/m. GENERAL:  Well appearing.  Frequent coughing throughout the exam. HEENT:  Pupils equal round and reactive, fundi not visualized, oral mucosa unremarkable NECK:  No jugular venous distention, waveform within normal limits, carotid upstroke brisk and symmetric, no bruits LYMPHATICS:  No cervical adenopathy LUNGS:  Coarse breath sounds and expiratory wheezing bilaterally. Decreased air movement throughout. HEART:  RRR.  PMI not displaced or sustained,S1 and S2 within normal limits, no S3, no S4, no clicks, no rubs, no murmurs ABD:  Flat, positive bowel sounds normal in frequency in pitch, no bruits, no rebound, no guarding, no midline pulsatile mass, no hepatomegaly, no splenomegaly EXT:  2 plus pulses throughout, no edema, no cyanosis no  clubbing SKIN:  No rashes no nodules NEURO:  Cranial nerves II through XII grossly intact, motor grossly intact throughout PSYCH:  Cognitively intact, oriented to person place and time  EKG:  EKG is not ordered today.  Echo 09/25/16: Study Conclusions  - Left ventricle: The cavity size was normal. Wall thickness was   normal. Systolic function was normal. The estimated ejection   fraction was in the range of 60% to 65%. Wall motion was normal;   there were no regional wall motion abnormalities. Features are   consistent with a pseudonormal left ventricular filling pattern,   with concomitant abnormal relaxation and increased filling   pressure (grade 2 diastolic dysfunction). - Left atrium: The atrium was mildly dilated. - Right atrium: The atrium was mildly dilated.  Lexiscan Myoview 12/20/15:  Nuclear stress EF: 69%.  The left ventricular ejection fraction is hyperdynamic (>65%).  The study is normal.  This is a low risk study.  Recent Labs: 01/01/2016: Magnesium 2.3; TSH 2.213 09/14/2016: ALT 6 09/24/2016: B Natriuretic Peptide 78.2 10/06/2016: BUN 46; Creat 2.78; Hemoglobin 10.3; Platelets 252; Potassium 4.1; Sodium 135    Lipid Panel    Component Value Date/Time   CHOL 147 03/14/2015 0806   TRIG 219 (H) 03/14/2015 0806   HDL 41 03/14/2015 0806   CHOLHDL 3.6 03/14/2015 0806   CHOLHDL 3.0 Ratio 05/14/2010 2347   VLDL 40 05/14/2010 2347   LDLCALC 62 03/14/2015 0806      Wt Readings from Last 3 Encounters:  10/26/16 99.3 kg (219 lb)  10/06/16 97.3 kg (214 lb 9.6 oz)  09/28/16 99.1 kg (218 lb 6.4 oz)      ASSESSMENT AND PLAN:  # Chronic diastolic heart failure:  # Hypertensive heart disease: Michelle Buck is euvolemic and her weight is stable.  She was advised to continue wearing herself daily and to avoid salt. Continue hydralazine, metoprolol, and lasix.  She is not on an ACE-I/ARB due to chronic kidney disease.  Blood pressure is slightly above goal. However,  she reports orthostasis and had a fall after standing to answer her door bell. Therefore, we  will not increase her antihypertensives at this time.   # Hyperlipidemia: Continue simvastatin.  This is managed by her PCP.  Current medicines are reviewed at length with the patient today.  The patient does not have concerns regarding medicines.  The following changes have been made:  no change  Labs/ tests ordered today include:   No orders of the defined types were placed in this encounter.    Disposition:   FU with Krysta Bloomfield C. Oval Linsey, MD, [facility] in 3 months   Signed, Clarabelle Oscarson C. Oval Linsey, MD, [facility]  10/26/2016 11:09 AM    [place]

## 2016-10-26 ENCOUNTER — Other Ambulatory Visit: Payer: Self-pay | Admitting: Nurse Practitioner

## 2016-10-26 ENCOUNTER — Ambulatory Visit (INDEPENDENT_AMBULATORY_CARE_PROVIDER_SITE_OTHER): Payer: Medicare Other | Admitting: Cardiovascular Disease

## 2016-10-26 ENCOUNTER — Encounter: Payer: Self-pay | Admitting: Cardiovascular Disease

## 2016-10-26 VITALS — BP 146/66 | HR 68 | Ht 62.0 in | Wt 219.0 lb

## 2016-10-26 DIAGNOSIS — E7800 Pure hypercholesterolemia, unspecified: Secondary | ICD-10-CM

## 2016-10-26 DIAGNOSIS — I110 Hypertensive heart disease with heart failure: Secondary | ICD-10-CM

## 2016-10-26 DIAGNOSIS — I5032 Chronic diastolic (congestive) heart failure: Secondary | ICD-10-CM

## 2016-10-26 NOTE — Patient Instructions (Signed)
Medication Instructions:  Your physician recommends that you continue on your current medications as directed. Please refer to the Current Medication list given to you today.  Labwork: none  Testing/Procedures: none  Follow-Up: Your physician recommends that you schedule a follow-up appointment in: 3 month ov  If you need a refill on your cardiac medications before your next appointment, please call your pharmacy.  

## 2016-10-27 ENCOUNTER — Ambulatory Visit (INDEPENDENT_AMBULATORY_CARE_PROVIDER_SITE_OTHER): Payer: Medicare Other

## 2016-10-27 VITALS — BP 132/80 | HR 50 | Temp 97.9°F | Ht 62.0 in | Wt 215.2 lb

## 2016-10-27 DIAGNOSIS — Z0000 Encounter for general adult medical examination without abnormal findings: Secondary | ICD-10-CM

## 2016-10-27 DIAGNOSIS — J441 Chronic obstructive pulmonary disease with (acute) exacerbation: Secondary | ICD-10-CM | POA: Diagnosis not present

## 2016-10-27 DIAGNOSIS — E1122 Type 2 diabetes mellitus with diabetic chronic kidney disease: Secondary | ICD-10-CM | POA: Diagnosis not present

## 2016-10-27 DIAGNOSIS — E1151 Type 2 diabetes mellitus with diabetic peripheral angiopathy without gangrene: Secondary | ICD-10-CM | POA: Diagnosis not present

## 2016-10-27 DIAGNOSIS — I5033 Acute on chronic diastolic (congestive) heart failure: Secondary | ICD-10-CM | POA: Diagnosis not present

## 2016-10-27 DIAGNOSIS — I130 Hypertensive heart and chronic kidney disease with heart failure and stage 1 through stage 4 chronic kidney disease, or unspecified chronic kidney disease: Secondary | ICD-10-CM | POA: Diagnosis not present

## 2016-10-27 DIAGNOSIS — N184 Chronic kidney disease, stage 4 (severe): Secondary | ICD-10-CM | POA: Diagnosis not present

## 2016-10-27 NOTE — Patient Instructions (Addendum)
Michelle Buck , Thank you for taking time to come for your Medicare Wellness Visit. I appreciate your ongoing commitment to your health goals. Please review the following plan we discussed and let me know if I can assist you in the future.   These are the goals we discussed: Goals    . Weight (lb) < 200 lb (90.7 kg)          Starting 10/27/16, I will attempt to lose 15 lbs, to get under 200lbs.        This is a list of the screening recommended for you and due dates:  Health Maintenance  Topic Date Due  . Complete foot exam   03/16/2017*  . Shingles Vaccine  06/08/2017*  . Tetanus Vaccine  11/30/2018*  . Urine Protein Check  02/25/2017  . Hemoglobin A1C  03/15/2017  . Eye exam for diabetics  06/11/2017  . Flu Shot  Completed  . DEXA scan (bone density measurement)  Completed  . Pneumonia vaccines  Completed  *Topic was postponed. The date shown is not the original due date.  Preventive Care for Adults  A healthy lifestyle and preventive care can promote health and wellness. Preventive health guidelines for adults include the following key practices.  . A routine yearly physical is a good way to check with your health care provider about your health and preventive screening. It is a chance to share any concerns and updates on your health and to receive a thorough exam.  . Visit your dentist for a routine exam and preventive care every 6 months. Brush your teeth twice a day and floss once a day. Good oral hygiene prevents tooth decay and gum disease.  . The frequency of eye exams is based on your age, health, family medical history, use  of contact lenses, and other factors. Follow your health care provider's ecommendations for frequency of eye exams.  . Eat a healthy diet. Foods like vegetables, fruits, whole grains, low-fat dairy products, and lean protein foods contain the nutrients you need without too many calories. Decrease your intake of foods high in solid fats, added sugars,  and salt. Eat the right amount of calories for you. Get information about a proper diet from your health care provider, if necessary.  . Regular physical exercise is one of the most important things you can do for your health. Most adults should get at least 150 minutes of moderate-intensity exercise (any activity that increases your heart rate and causes you to sweat) each week. In addition, most adults need muscle-strengthening exercises on 2 or more days a week.  Silver Sneakers may be a benefit available to you. To determine eligibility, you may visit the website: www.silversneakers.com or contact program at 620-648-6561 Mon-Fri between 8AM-8PM.   . Maintain a healthy weight. The body mass index (BMI) is a screening tool to identify possible weight problems. It provides an estimate of body fat based on height and weight. Your health care provider can find your BMI and can help you achieve or maintain a healthy weight.   For adults 20 years and older: ? A BMI below 18.5 is considered underweight. ? A BMI of 18.5 to 24.9 is normal. ? A BMI of 25 to 29.9 is considered overweight. ? A BMI of 30 and above is considered obese.   . Maintain normal blood lipids and cholesterol levels by exercising and minimizing your intake of saturated fat. Eat a balanced diet with plenty of fruit and vegetables. Blood tests  for lipids and cholesterol should begin at age 80 and be repeated every 5 years. If your lipid or cholesterol levels are high, you are over 50, or you are at high risk for heart disease, you may need your cholesterol levels checked more frequently. Ongoing high lipid and cholesterol levels should be treated with medicines if diet and exercise are not working.  . If you smoke, find out from your health care provider how to quit. If you do not use tobacco, please do not start.  . If you choose to drink alcohol, please do not consume more than 2 drinks per day. One drink is considered to be 12  ounces (355 mL) of beer, 5 ounces (148 mL) of wine, or 1.5 ounces (44 mL) of liquor.  . If you are 49-64 years old, ask your health care provider if you should take aspirin to prevent strokes.  . Use sunscreen. Apply sunscreen liberally and repeatedly throughout the day. You should seek shade when your shadow is shorter than you. Protect yourself by wearing long sleeves, pants, a wide-brimmed hat, and sunglasses year round, whenever you are outdoors.  . Once a month, do a whole body skin exam, using a mirror to look at the skin on your back. Tell your health care provider of new moles, moles that have irregular borders, moles that are larger than a pencil eraser, or moles that have changed in shape or color.

## 2016-10-27 NOTE — Progress Notes (Signed)
Quick Notes   Health Maintenance:   Up to date   Abnormal Screen:  MMSE- 23/30 Failed Clock test   Patient Concerns:   None   Nurse Concerns:   None

## 2016-10-27 NOTE — Progress Notes (Addendum)
Subjective:   Michelle Buck is a 80 y.o. female who presents for an Initial Medicare Annual Wellness Visit.  Review of Systems    Cardiac Risk Factors include: advanced age (>68men, >70 women);diabetes mellitus;family history of premature cardiovascular disease;hypertension;obesity (BMI >30kg/m2);sedentary lifestyle;smoking/ tobacco exposure     Objective:    Today's Vitals   10/27/16 1006 10/27/16 1007  BP: 132/80   Pulse: (!) 50   Temp: 97.9 F (36.6 C)   TempSrc: Oral   SpO2: 98%   Weight: 215 lb 3.2 oz (97.6 kg)   Height: 5\' 2"  (1.575 m)   PainSc: 10-Worst pain ever 10-Worst pain ever   Body mass index is 39.36 kg/m.   Current Medications (verified) Outpatient Encounter Prescriptions as of 10/27/2016  Medication Sig  . albuterol (PROVENTIL HFA;VENTOLIN HFA) 108 (90 Base) MCG/ACT inhaler Inhale 2 puffs into the lungs every 4 (four) hours as needed for wheezing or shortness of breath.  . AMITIZA 24 MCG capsule TAKE 1 CAPSULE BY MOUTH TWICE DAILY WITH MEALS  . aspirin EC 81 MG tablet Take 81 mg by mouth daily.  . budesonide (PULMICORT) 0.25 MG/2ML nebulizer solution Take one ampule in Neb twice daily routinely for COPD  J44.1  . Cholecalciferol (VITAMIN D3) 2000 units TABS Take 2,000 Units by mouth daily.  . cilostazol (PLETAL) 100 MG tablet TAKE 1 TABLET BY MOUTH EVERY DAY.  . cycloSPORINE (RESTASIS) 0.05 % ophthalmic emulsion Place 1 drop into both eyes 2 (two) times daily. One drop once a day bilaterally for dry eyes  . diclofenac sodium (VOLTAREN) 1 % GEL APPLY 4 GRAM TOPICALLY TWICE DAILY as needed for pain  . DULoxetine (CYMBALTA) 30 MG capsule TAKE 1 CAPSULE BY MOUTH EVERY DAY TO HELP ANXIETY AND PAINS.  Marland Kitchen EASY TOUCH INSULIN SYRINGE 31G X 5/16" 0.5 ML MISC USE UP TO 2 TIMES A DAY AS DIRECTED.  Marland Kitchen esomeprazole (NEXIUM) 40 MG capsule TAKE 1 CAPSULE BY MOUTH ONCE DAILY.  . furosemide (LASIX) 40 MG tablet Take 1 tablet by mouth 2 (two) times daily.  Marland Kitchen gabapentin  (NEURONTIN) 100 MG capsule TAKE 2 CAPSULES BY MOUTH THREE TIMES A DAY  . guaifenesin (ROBITUSSIN) 100 MG/5ML syrup Take 200 mg by mouth 3 (three) times daily as needed for cough.  . hydrALAZINE (APRESOLINE) 50 MG tablet TAKE 1 TABLET BY MOUTH THREE TIMES A DAY  . HYDROcodone-acetaminophen (NORCO) 10-325 MG tablet Take 1 tablet by mouth every 6 (six) hours as needed.  . insulin NPH-regular Human (NOVOLIN 70/30) (70-30) 100 UNIT/ML injection Inject 40 units subcutaneous with breakfast and then take 25 units with dinner to control blood sugar.  . ipratropium-albuterol (DUONEB) 0.5-2.5 (3) MG/3ML SOLN Take 65mls by nebulization every 6 hours as needed for SOB/Wheezing  . linaclotide (LINZESS) 145 MCG CAPS capsule Take 145 mcg by mouth 2 (two) times daily.  . metoprolol succinate (TOPROL-XL) 25 MG 24 hr tablet TAKE 1 TABLET BY MOUTH EVERY DAY WITH OR IMMEDIATELY FOLLOWING A MEAL FOR BLOOD PRESSURE.  Marland Kitchen potassium chloride SA (K-DUR,KLOR-CON) 20 MEQ tablet TAKE 1 TABLET BY MOUTH EVERY DAY  . sertraline (ZOLOFT) 50 MG tablet TAKE 1 TABLET BY MOUTH EVERY DAY.  . simvastatin (ZOCOR) 20 MG tablet TAKE 1 TABLET BY MOUTH EVERY DAY.  . sucralfate (CARAFATE) 1 g tablet TAKE 1 TABLET BY MOUTH TWICE DAILY ON EMPTY STOMACH.  . timolol (TIMOPTIC) 0.5 % ophthalmic solution INSTILL 1 DROP INTO EACH EYE TWICE A DAY  . TRAVATAN Z 0.004 % SOLN ophthalmic  solution Place 1 drop into both eyes at bedtime.    No facility-administered encounter medications on file as of 10/27/2016.     Allergies (verified) Tramadol; Codeine; Hydrocodone; Penicillins; and Tylenol [acetaminophen]   History: Past Medical History:  Diagnosis Date  . Acute bronchitis   . Anxiety   . Arthritis   . Chronic kidney disease, stage II (mild)   . Complications affecting other specified body systems, hypertension   . Congestive heart failure, unspecified    stress test in March at [facility]  . COPD (chronic obstructive pulmonary  disease) ([place])    present for several years, diagnosed in the last few years  . Degenerative arthritis of knee, bilateral   . Diabetes mellitus   . Diaphragmatic hernia without mention of obstruction or gangrene   . Disorder of bone and cartilage, unspecified   . GERD (gastroesophageal reflux disease)   . Hiatal hernia   . Hyperlipidemia   . Hypertension   . Hypopotassemia   . Insomnia, unspecified   . Lumbago   . Obese   . Osteoarthrosis, unspecified whether generalized or localized, unspecified site   . PONV (postoperative nausea and vomiting)   . Scoliosis   . Secondary diabetes mellitus with renal manifestations, not stated as uncontrolled, or unspecified(249.40)   . Unspecified glaucoma(365.9)   . Unspecified hereditary and idiopathic peripheral neuropathy    Past Surgical History:  Procedure Laterality Date  . ABDOMINAL HYSTERECTOMY    . COLONOSCOPY  08/06/2011  . FOOT SURGERY    . KNEE ARTHROSCOPY     bilateral  . SHOULDER SURGERY    . TOTAL HIP ARTHROPLASTY     bilateral   Family History  Problem Relation Age of Onset  . Diabetes Mother   . Heart disease Mother   . Heart disease Brother   . Diabetes Brother   . Kidney disease Father   . Scoliosis Sister   . Diabetes Brother   . Diabetes Brother   . Heart disease Brother   . Scoliosis Brother   . Stroke Brother   . Heart attack Brother    Social History   Occupational History  . Retired Unemployed   Social History Main Topics  . Smoking status: Former Smoker    Packs/day: 0.50    Years: 3.00    Types: Cigarettes    Quit date: 02/27/1997  . Smokeless tobacco: Never Used     Comment: remote history - stopped 19 years ago; 20 pack year history  . Alcohol use No  . Drug use: No  . Sexual activity: No    Tobacco Counseling Counseling given: No   Activities of Daily Living In your present state of health, do you have any difficulty performing the following activities: 10/27/2016 09/25/2016    Hearing? Y N  Vision? N N  Difficulty concentrating or making decisions? N N  Walking or climbing stairs? Y Y  Dressing or bathing? Y N  Doing errands, shopping? Y N  Preparing Food and eating ? Y -  Using the Toilet? N -  In the past six months, have you accidently leaked urine? Y -  Do you have problems with loss of bowel control? N -  Managing your Medications? Y -  Managing your Finances? Y -  Housekeeping or managing your Housekeeping? Y -  Some recent data might be hidden    Immunizations and Health Maintenance Immunization History  Administered Date(s) Administered  . Influenza Split 09/20/2012  . Influenza, High Dose  Seasonal PF 09/02/2016  . Influenza,inj,Quad PF,36+ Mos 10/24/2013, 10/10/2014, 08/20/2015  . Pneumococcal Conjugate-13 02/19/2015  . Pneumococcal Polysaccharide-23 12/01/2011   There are no preventive care reminders to display for this patient.  Patient Care Team: [name], NP as PCP - General (Nurse Practitioner) [name], MD as Consulting Physician (Ophthalmology) Loletta Specter, RN as [facility] any recent Medical Services you may have received from other than [facility] providers in the past year (date may be approximate).     Assessment:   This is a routine wellness examination for Michelle Buck.  Hearing/Vision screen Hearing Screening Comments: Pt states she has never had a hearing screen done. States she has decreased hearing in her left ear.  Vision Screening Comments: Last eye exam w/ Dr. Gershon Crane on 06/11/16  Dietary issues and exercise activities discussed: Current Exercise Habits: The patient does not participate in regular exercise at present, Exercise limited by: respiratory conditions(s);cardiac condition(s)  Goals    . Weight (lb) < 200 lb (90.7 kg)          Starting 10/27/16, I will attempt to lose 15 lbs, to get under 200lbs.       Depression Screen PHQ 2/9 Scores 10/27/2016  04/08/2016 03/12/2016 01/08/2016 12/31/2015 12/10/2015 05/30/2015  PHQ - 2 Score 0 0 0 0 0 0 0    Fall Risk Fall Risk  10/27/2016 10/06/2016 09/18/2016 07/10/2016 06/08/2016  Falls in the past year? Yes No Yes No No  Number falls in past yr: 1 - 1 - -  Injury with Fall? Yes - No - -  Risk for fall due to : Impaired balance/gait;History of fall(s) - - - -  Follow up Falls prevention discussed - - - -    Cognitive Function: MMSE - Mini Mental State Exam 10/27/2016  Orientation to time 5  Orientation to Place 3  Registration 3  Attention/ Calculation 3  Recall 3  Language- name 2 objects 2  Language- repeat 1  Language- follow 3 step command 3  Language- read & follow direction 0  Write a sentence 0  Copy design 0  Total score 23        Screening Tests Health Maintenance  Topic Date Due  . FOOT EXAM  03/16/2017 (Originally 03/17/2016)  . ZOSTAVAX  06/08/2017 (Originally 06/06/1998)  . TETANUS/TDAP  11/30/2018 (Originally 06/06/1955)  . URINE MICROALBUMIN  02/25/2017  . HEMOGLOBIN A1C  03/15/2017  . OPHTHALMOLOGY EXAM  06/11/2017  . INFLUENZA VACCINE  Completed  . DEXA SCAN  Completed  . PNA vac Low Risk Adult  Completed      Plan:    I have personally reviewed and addressed the Medicare Annual Wellness questionnaire and have noted the following in the patient's chart:  A. Medical and social history B. Use of alcohol, tobacco or illicit drugs  C. Current medications and supplements D. Functional ability and status E.  Nutritional status F.  Physical activity G. Advance directives H. List of other physicians I.  Hospitalizations, surgeries, and ER visits in previous 12 months J.  [place] to include hearing, vision, cognitive, depression L. Referrals and appointments - none  In addition, I have reviewed and discussed with patient certain preventive protocols, quality metrics, and best practice recommendations. A written personalized care plan for preventive  services as well as general preventive health recommendations were provided to patient.  See attached scanned questionnaire for additional information.   Signed,   Allyn Kenner, LPN Health  Advisor  I reviewed health advisors note, was available for consultation and agree with documentation and plan.  Carlos American. Harle Battiest  [facility] [phone] 8 am - 5 pm) [phone] (after hours)

## 2016-10-28 ENCOUNTER — Other Ambulatory Visit: Payer: Self-pay | Admitting: Sports Medicine

## 2016-10-28 ENCOUNTER — Other Ambulatory Visit: Payer: Self-pay | Admitting: Nurse Practitioner

## 2016-10-28 DIAGNOSIS — M2141 Flat foot [pes planus] (acquired), right foot: Secondary | ICD-10-CM

## 2016-10-28 DIAGNOSIS — M19079 Primary osteoarthritis, unspecified ankle and foot: Secondary | ICD-10-CM

## 2016-10-28 DIAGNOSIS — I739 Peripheral vascular disease, unspecified: Secondary | ICD-10-CM

## 2016-10-28 DIAGNOSIS — M2142 Flat foot [pes planus] (acquired), left foot: Secondary | ICD-10-CM

## 2016-10-29 ENCOUNTER — Other Ambulatory Visit: Payer: Self-pay

## 2016-10-29 DIAGNOSIS — M545 Low back pain: Principal | ICD-10-CM

## 2016-10-29 DIAGNOSIS — G8929 Other chronic pain: Secondary | ICD-10-CM

## 2016-10-29 MED ORDER — HYDROCODONE-ACETAMINOPHEN 10-325 MG PO TABS: 1.0000 | ORAL_TABLET | Freq: Four times a day (QID) | ORAL | 0 refills | Status: DC | PRN

## 2016-10-30 DIAGNOSIS — N184 Chronic kidney disease, stage 4 (severe): Secondary | ICD-10-CM | POA: Diagnosis not present

## 2016-10-30 DIAGNOSIS — I5033 Acute on chronic diastolic (congestive) heart failure: Secondary | ICD-10-CM | POA: Diagnosis not present

## 2016-10-30 DIAGNOSIS — E1122 Type 2 diabetes mellitus with diabetic chronic kidney disease: Secondary | ICD-10-CM | POA: Diagnosis not present

## 2016-10-30 DIAGNOSIS — J441 Chronic obstructive pulmonary disease with (acute) exacerbation: Secondary | ICD-10-CM | POA: Diagnosis not present

## 2016-10-30 DIAGNOSIS — E1151 Type 2 diabetes mellitus with diabetic peripheral angiopathy without gangrene: Secondary | ICD-10-CM | POA: Diagnosis not present

## 2016-10-30 DIAGNOSIS — I130 Hypertensive heart and chronic kidney disease with heart failure and stage 1 through stage 4 chronic kidney disease, or unspecified chronic kidney disease: Secondary | ICD-10-CM | POA: Diagnosis not present

## 2016-10-31 DIAGNOSIS — E1122 Type 2 diabetes mellitus with diabetic chronic kidney disease: Secondary | ICD-10-CM | POA: Diagnosis not present

## 2016-10-31 DIAGNOSIS — I5033 Acute on chronic diastolic (congestive) heart failure: Secondary | ICD-10-CM | POA: Diagnosis not present

## 2016-10-31 DIAGNOSIS — I130 Hypertensive heart and chronic kidney disease with heart failure and stage 1 through stage 4 chronic kidney disease, or unspecified chronic kidney disease: Secondary | ICD-10-CM | POA: Diagnosis not present

## 2016-10-31 DIAGNOSIS — J441 Chronic obstructive pulmonary disease with (acute) exacerbation: Secondary | ICD-10-CM | POA: Diagnosis not present

## 2016-10-31 DIAGNOSIS — E1151 Type 2 diabetes mellitus with diabetic peripheral angiopathy without gangrene: Secondary | ICD-10-CM | POA: Diagnosis not present

## 2016-10-31 DIAGNOSIS — N184 Chronic kidney disease, stage 4 (severe): Secondary | ICD-10-CM | POA: Diagnosis not present

## 2016-11-04 DIAGNOSIS — E1151 Type 2 diabetes mellitus with diabetic peripheral angiopathy without gangrene: Secondary | ICD-10-CM | POA: Diagnosis not present

## 2016-11-04 DIAGNOSIS — I130 Hypertensive heart and chronic kidney disease with heart failure and stage 1 through stage 4 chronic kidney disease, or unspecified chronic kidney disease: Secondary | ICD-10-CM | POA: Diagnosis not present

## 2016-11-04 DIAGNOSIS — E1122 Type 2 diabetes mellitus with diabetic chronic kidney disease: Secondary | ICD-10-CM | POA: Diagnosis not present

## 2016-11-04 DIAGNOSIS — I5033 Acute on chronic diastolic (congestive) heart failure: Secondary | ICD-10-CM | POA: Diagnosis not present

## 2016-11-04 DIAGNOSIS — N184 Chronic kidney disease, stage 4 (severe): Secondary | ICD-10-CM | POA: Diagnosis not present

## 2016-11-04 DIAGNOSIS — J441 Chronic obstructive pulmonary disease with (acute) exacerbation: Secondary | ICD-10-CM | POA: Diagnosis not present

## 2016-11-05 ENCOUNTER — Other Ambulatory Visit: Payer: Self-pay

## 2016-11-05 NOTE — Patient Outreach (Signed)
[place] telephone assessment: Admitted : 09/24/2016 Discharged:   09/28/2016  Patient reassigned to this case manager for follow up: Placed call to patient who identified herself.  Patient who reports that she is still in the bed and has not weighed today. Reports yesterday weight of 213 pounds.  Patient reports to me that she knows how to mange her heart failure. States that she follows her low salt diet daily and weighs daily. Patient states at the end of the day she has some swelling in her legs but none otherwise.  Reports that she has a caregiver that comes to assist her daily from 10-1.   Patient chief concern today is pain. Reports that she has pain daily with arthritis and sciatic pain.  States that she takes her pain medication as directed. Patient reports that she has had a cough for a couple of weeks but denies fever.   Reviewed completion of 30 day transition of care program.  Patient reports that she is knows how to manage her heart failure. Patient saw cardiology and primary Md last week.  Reviewed with patient when to call MD.  Patient denied any other needs. Since patient has successfully completed transition of care program without readmission patient agrees to case closure.   PLAN: will notify MD.  Patient agreed to case closure letter with my contact information for future reference.  Will notify case management assistant of case closure.  Tomasa Rand, RN, BSN, CEN [facility] [employer] [phone]

## 2016-11-11 DIAGNOSIS — E1151 Type 2 diabetes mellitus with diabetic peripheral angiopathy without gangrene: Secondary | ICD-10-CM | POA: Diagnosis not present

## 2016-11-11 DIAGNOSIS — N184 Chronic kidney disease, stage 4 (severe): Secondary | ICD-10-CM | POA: Diagnosis not present

## 2016-11-11 DIAGNOSIS — E1122 Type 2 diabetes mellitus with diabetic chronic kidney disease: Secondary | ICD-10-CM | POA: Diagnosis not present

## 2016-11-11 DIAGNOSIS — I130 Hypertensive heart and chronic kidney disease with heart failure and stage 1 through stage 4 chronic kidney disease, or unspecified chronic kidney disease: Secondary | ICD-10-CM | POA: Diagnosis not present

## 2016-11-11 DIAGNOSIS — J441 Chronic obstructive pulmonary disease with (acute) exacerbation: Secondary | ICD-10-CM | POA: Diagnosis not present

## 2016-11-11 DIAGNOSIS — I5033 Acute on chronic diastolic (congestive) heart failure: Secondary | ICD-10-CM | POA: Diagnosis not present

## 2016-11-12 DIAGNOSIS — E1151 Type 2 diabetes mellitus with diabetic peripheral angiopathy without gangrene: Secondary | ICD-10-CM | POA: Diagnosis not present

## 2016-11-12 DIAGNOSIS — N184 Chronic kidney disease, stage 4 (severe): Secondary | ICD-10-CM | POA: Diagnosis not present

## 2016-11-12 DIAGNOSIS — I130 Hypertensive heart and chronic kidney disease with heart failure and stage 1 through stage 4 chronic kidney disease, or unspecified chronic kidney disease: Secondary | ICD-10-CM | POA: Diagnosis not present

## 2016-11-12 DIAGNOSIS — E1122 Type 2 diabetes mellitus with diabetic chronic kidney disease: Secondary | ICD-10-CM | POA: Diagnosis not present

## 2016-11-12 DIAGNOSIS — I5033 Acute on chronic diastolic (congestive) heart failure: Secondary | ICD-10-CM | POA: Diagnosis not present

## 2016-11-12 DIAGNOSIS — J441 Chronic obstructive pulmonary disease with (acute) exacerbation: Secondary | ICD-10-CM | POA: Diagnosis not present

## 2016-11-13 ENCOUNTER — Telehealth: Payer: Self-pay

## 2016-11-13 NOTE — Telephone Encounter (Signed)
Michelle Buck with [facility] called to request verbal order to extend home health physical therapy for 2 x weekly for 2 weeks.  Per [employer] standing order, verbal order given. Message will be sent to patient's provider as a FYI.

## 2016-11-17 DIAGNOSIS — E1122 Type 2 diabetes mellitus with diabetic chronic kidney disease: Secondary | ICD-10-CM | POA: Diagnosis not present

## 2016-11-17 DIAGNOSIS — N184 Chronic kidney disease, stage 4 (severe): Secondary | ICD-10-CM | POA: Diagnosis not present

## 2016-11-17 DIAGNOSIS — I130 Hypertensive heart and chronic kidney disease with heart failure and stage 1 through stage 4 chronic kidney disease, or unspecified chronic kidney disease: Secondary | ICD-10-CM | POA: Diagnosis not present

## 2016-11-17 DIAGNOSIS — I5033 Acute on chronic diastolic (congestive) heart failure: Secondary | ICD-10-CM | POA: Diagnosis not present

## 2016-11-17 DIAGNOSIS — J441 Chronic obstructive pulmonary disease with (acute) exacerbation: Secondary | ICD-10-CM | POA: Diagnosis not present

## 2016-11-17 DIAGNOSIS — E1151 Type 2 diabetes mellitus with diabetic peripheral angiopathy without gangrene: Secondary | ICD-10-CM | POA: Diagnosis not present

## 2016-11-18 ENCOUNTER — Ambulatory Visit: Payer: Self-pay | Admitting: Pulmonary Disease

## 2016-11-18 DIAGNOSIS — I130 Hypertensive heart and chronic kidney disease with heart failure and stage 1 through stage 4 chronic kidney disease, or unspecified chronic kidney disease: Secondary | ICD-10-CM | POA: Diagnosis not present

## 2016-11-18 DIAGNOSIS — E1122 Type 2 diabetes mellitus with diabetic chronic kidney disease: Secondary | ICD-10-CM | POA: Diagnosis not present

## 2016-11-18 DIAGNOSIS — J441 Chronic obstructive pulmonary disease with (acute) exacerbation: Secondary | ICD-10-CM | POA: Diagnosis not present

## 2016-11-18 DIAGNOSIS — E1151 Type 2 diabetes mellitus with diabetic peripheral angiopathy without gangrene: Secondary | ICD-10-CM | POA: Diagnosis not present

## 2016-11-18 DIAGNOSIS — I5033 Acute on chronic diastolic (congestive) heart failure: Secondary | ICD-10-CM | POA: Diagnosis not present

## 2016-11-18 DIAGNOSIS — N184 Chronic kidney disease, stage 4 (severe): Secondary | ICD-10-CM | POA: Diagnosis not present

## 2016-11-19 ENCOUNTER — Other Ambulatory Visit: Payer: Self-pay | Admitting: Nurse Practitioner

## 2016-11-20 ENCOUNTER — Other Ambulatory Visit: Payer: Self-pay | Admitting: Sports Medicine

## 2016-11-20 DIAGNOSIS — I5033 Acute on chronic diastolic (congestive) heart failure: Secondary | ICD-10-CM | POA: Diagnosis not present

## 2016-11-20 DIAGNOSIS — E1122 Type 2 diabetes mellitus with diabetic chronic kidney disease: Secondary | ICD-10-CM | POA: Diagnosis not present

## 2016-11-20 DIAGNOSIS — M2141 Flat foot [pes planus] (acquired), right foot: Secondary | ICD-10-CM

## 2016-11-20 DIAGNOSIS — M2142 Flat foot [pes planus] (acquired), left foot: Secondary | ICD-10-CM

## 2016-11-20 DIAGNOSIS — M19079 Primary osteoarthritis, unspecified ankle and foot: Secondary | ICD-10-CM

## 2016-11-20 DIAGNOSIS — I739 Peripheral vascular disease, unspecified: Secondary | ICD-10-CM

## 2016-11-20 DIAGNOSIS — I130 Hypertensive heart and chronic kidney disease with heart failure and stage 1 through stage 4 chronic kidney disease, or unspecified chronic kidney disease: Secondary | ICD-10-CM | POA: Diagnosis not present

## 2016-11-20 DIAGNOSIS — E1151 Type 2 diabetes mellitus with diabetic peripheral angiopathy without gangrene: Secondary | ICD-10-CM | POA: Diagnosis not present

## 2016-11-20 DIAGNOSIS — J441 Chronic obstructive pulmonary disease with (acute) exacerbation: Secondary | ICD-10-CM | POA: Diagnosis not present

## 2016-11-20 DIAGNOSIS — N184 Chronic kidney disease, stage 4 (severe): Secondary | ICD-10-CM | POA: Diagnosis not present

## 2016-11-24 ENCOUNTER — Other Ambulatory Visit: Payer: Self-pay | Admitting: *Deleted

## 2016-11-24 MED ORDER — BUDESONIDE 0.25 MG/2ML IN SUSP: RESPIRATORY_TRACT | 6 refills | Status: DC

## 2016-11-24 NOTE — Telephone Encounter (Signed)
[facility] [address] [facility] 

## 2016-11-26 ENCOUNTER — Other Ambulatory Visit: Payer: Self-pay | Admitting: Nurse Practitioner

## 2016-11-27 ENCOUNTER — Other Ambulatory Visit: Payer: Self-pay | Admitting: *Deleted

## 2016-11-27 DIAGNOSIS — M545 Low back pain, unspecified: Secondary | ICD-10-CM

## 2016-11-27 DIAGNOSIS — G8929 Other chronic pain: Secondary | ICD-10-CM

## 2016-11-27 MED ORDER — HYDROCODONE-ACETAMINOPHEN 10-325 MG PO TABS: 1.0000 | ORAL_TABLET | Freq: Four times a day (QID) | ORAL | 0 refills | Status: DC | PRN

## 2016-11-27 NOTE — Telephone Encounter (Signed)
Patient requested and sister, Stoney Bang will pick up

## 2016-12-02 ENCOUNTER — Other Ambulatory Visit: Payer: Self-pay | Admitting: *Deleted

## 2016-12-02 DIAGNOSIS — J441 Chronic obstructive pulmonary disease with (acute) exacerbation: Secondary | ICD-10-CM

## 2016-12-02 MED ORDER — FUROSEMIDE 40 MG PO TABS: 40.0000 mg | ORAL_TABLET | Freq: Two times a day (BID) | ORAL | 1 refills | Status: DC

## 2016-12-02 NOTE — Telephone Encounter (Signed)
Patient requested Refill

## 2016-12-14 ENCOUNTER — Encounter: Payer: Self-pay | Admitting: Pharmacotherapy

## 2016-12-14 ENCOUNTER — Ambulatory Visit (INDEPENDENT_AMBULATORY_CARE_PROVIDER_SITE_OTHER): Payer: Medicare Other | Admitting: Pharmacotherapy

## 2016-12-14 VITALS — BP 126/68 | HR 65 | Resp 14 | Ht 62.0 in | Wt 189.0 lb

## 2016-12-14 DIAGNOSIS — E1121 Type 2 diabetes mellitus with diabetic nephropathy: Secondary | ICD-10-CM | POA: Diagnosis not present

## 2016-12-14 DIAGNOSIS — Z794 Long term (current) use of insulin: Secondary | ICD-10-CM | POA: Diagnosis not present

## 2016-12-14 DIAGNOSIS — I10 Essential (primary) hypertension: Secondary | ICD-10-CM | POA: Diagnosis not present

## 2016-12-14 LAB — COMPLETE METABOLIC PANEL WITH GFR
ALT: 8 U/L (ref 6–29)
AST: 18 U/L (ref 10–35)
Albumin: 3.9 g/dL (ref 3.6–5.1)
Alkaline Phosphatase: 90 U/L (ref 33–130)
BUN: 36 mg/dL — ABNORMAL HIGH (ref 7–25)
CO2: 27 mmol/L (ref 20–31)
Calcium: 9.4 mg/dL (ref 8.6–10.4)
Chloride: 100 mmol/L (ref 98–110)
Creat: 2.64 mg/dL — ABNORMAL HIGH (ref 0.60–0.93)
GFR, Est African American: 19 mL/min — ABNORMAL LOW (ref >=60)
GFR, Est Non African American: 17 mL/min — ABNORMAL LOW (ref >=60)
Glucose, Bld: 161 mg/dL — ABNORMAL HIGH (ref 65–99)
Potassium: 3.8 mmol/L (ref 3.5–5.3)
Sodium: 139 mmol/L (ref 135–146)
Total Bilirubin: 0.3 mg/dL (ref 0.2–1.2)
Total Protein: 7 g/dL (ref 6.1–8.1)

## 2016-12-14 LAB — HEMOGLOBIN A1C
Hgb A1c MFr Bld: 7.1 % — ABNORMAL HIGH (ref <5.7)
Mean Plasma Glucose: 157 mg/dL

## 2016-12-14 NOTE — Addendum Note (Signed)
Addended by: Logan Bores on: 12/14/2016 10:46 AM   Modules accepted: Orders

## 2016-12-14 NOTE — Progress Notes (Signed)
  Subjective:    Michelle Buck is a 81 y.o.African American female who presents for follow-up of Type 2 diabetes mellitus.   Last A1C: 6.5% (09/14/16) Her weight is down 24lb She is eating more veggies, less starchy food. Exercise is limited.  Forgot to bring blood glucose meter. She reports BG 100-158m/dl Hypoglycemia x 1 (lowest BG: 625mdl)  Wearing oxygen. Some blurry vision.  Eye exam is due. Has peripheral edema. Has peripheral neuropathy.  No sores or wounds. Nocturia every 3-4 hours No dysuria Staying well hydrated.  Review of Systems A comprehensive review of systems was negative except for: Eyes: positive for visual disturbance Cardiovascular: positive for lower extremity edema Genitourinary: positive for nocturia Integument/breast: positive for dryness Endocrine: positive for diabetic symptoms including blurry vision and skin dryness    Objective:    BP 126/68   Pulse 65   Resp 14   Ht 5' 2"$  (1.575 m)   Wt 189 lb (85.7 kg)   SpO2 97%   BMI 34.57 kg/m   General:  alert, cooperative and no distress  Oropharynx: normal findings: lips normal without lesions and gums healthy   Eyes:  negative findings: lids and lashes normal and conjunctivae and sclerae normal   Ears:  external ears normal        Lung: Wheeze bilaterally  Heart:  regular rate and rhythm     Extremities: edema bilateral lower extremities  Skin: dry     Neuro: mental status, speech normal, alert and oriented x3   Lab Review Glucose, Bld (mg/dL)  Date Value  10/06/2016 206 (H)  09/28/2016 161 (H)  09/27/2016 183 (H)   CO2 (mmol/L)  Date Value  10/06/2016 28  09/28/2016 31  09/27/2016 27   BUN (mg/dL)  Date Value  10/06/2016 46 (H)  09/28/2016 26 (H)  09/27/2016 25 (H)  02/26/2016 27  01/07/2016 21  10/15/2015 27   Creat (mg/dL)  Date Value  10/06/2016 2.78 (H)  09/14/2016 2.80 (H)  06/08/2016 2.07 (H)   Creatinine, Ser (mg/dL)  Date Value  09/28/2016 2.48 (H)   09/27/2016 2.29 (H)  09/26/2016 2.45 (H)   A1C today    Assessment:    Diabetes Mellitus type II, under good control. Last A1C was excellent BP at goal <140/90   Plan:    1.  Rx changes: none  2.  Continue 70/30 insulin 40 units breakfast, 20 units supper. 3.  Counseled on nutrition goals. 4.  Exercise goal is 30-45 minutes 5 x week 5.  BP at goal <140/90

## 2016-12-14 NOTE — Patient Instructions (Signed)
Keep up the good work

## 2016-12-18 ENCOUNTER — Ambulatory Visit: Payer: Medicare Other | Admitting: Internal Medicine

## 2016-12-20 DIAGNOSIS — L249 Irritant contact dermatitis, unspecified cause: Secondary | ICD-10-CM | POA: Diagnosis not present

## 2016-12-20 DIAGNOSIS — I248 Other forms of acute ischemic heart disease: Secondary | ICD-10-CM | POA: Diagnosis not present

## 2016-12-20 DIAGNOSIS — Z79891 Long term (current) use of opiate analgesic: Secondary | ICD-10-CM | POA: Diagnosis not present

## 2016-12-20 DIAGNOSIS — G8929 Other chronic pain: Secondary | ICD-10-CM | POA: Diagnosis not present

## 2016-12-20 DIAGNOSIS — I25118 Atherosclerotic heart disease of native coronary artery with other forms of angina pectoris: Secondary | ICD-10-CM | POA: Diagnosis not present

## 2016-12-20 DIAGNOSIS — Z7902 Long term (current) use of antithrombotics/antiplatelets: Secondary | ICD-10-CM | POA: Diagnosis not present

## 2016-12-21 ENCOUNTER — Other Ambulatory Visit: Payer: Self-pay | Admitting: Nurse Practitioner

## 2016-12-21 ENCOUNTER — Other Ambulatory Visit: Payer: Self-pay | Admitting: Sports Medicine

## 2016-12-21 DIAGNOSIS — M2142 Flat foot [pes planus] (acquired), left foot: Secondary | ICD-10-CM

## 2016-12-21 DIAGNOSIS — M19079 Primary osteoarthritis, unspecified ankle and foot: Secondary | ICD-10-CM

## 2016-12-21 DIAGNOSIS — M2141 Flat foot [pes planus] (acquired), right foot: Secondary | ICD-10-CM

## 2016-12-21 DIAGNOSIS — I739 Peripheral vascular disease, unspecified: Secondary | ICD-10-CM

## 2016-12-24 ENCOUNTER — Other Ambulatory Visit: Payer: Self-pay | Admitting: Internal Medicine

## 2016-12-24 ENCOUNTER — Other Ambulatory Visit: Payer: Self-pay | Admitting: Pharmacotherapy

## 2016-12-29 ENCOUNTER — Other Ambulatory Visit: Payer: Self-pay | Admitting: *Deleted

## 2016-12-29 ENCOUNTER — Telehealth: Payer: Self-pay

## 2016-12-29 DIAGNOSIS — M545 Low back pain, unspecified: Secondary | ICD-10-CM

## 2016-12-29 DIAGNOSIS — G8929 Other chronic pain: Secondary | ICD-10-CM

## 2016-12-29 MED ORDER — HYDROCODONE-ACETAMINOPHEN 10-325 MG PO TABS: 1.0000 | ORAL_TABLET | Freq: Four times a day (QID) | ORAL | 0 refills | Status: DC | PRN

## 2016-12-29 NOTE — Telephone Encounter (Signed)
I called patient to let her know that she has a prescription ready to be picked up at the office. Rx is for hydrocodone/apap 10-325 mg, #120.  Rx was placed in filing cabinet at front desk.

## 2016-12-29 NOTE — Telephone Encounter (Signed)
Patient requested and will pick up 

## 2016-12-30 ENCOUNTER — Telehealth: Payer: Self-pay

## 2016-12-30 ENCOUNTER — Ambulatory Visit (INDEPENDENT_AMBULATORY_CARE_PROVIDER_SITE_OTHER): Payer: Medicare Other | Admitting: Internal Medicine

## 2016-12-30 ENCOUNTER — Encounter: Payer: Self-pay | Admitting: Internal Medicine

## 2016-12-30 VITALS — BP 124/70 | HR 69 | Temp 97.6°F | Ht 62.0 in | Wt 212.6 lb

## 2016-12-30 DIAGNOSIS — N184 Chronic kidney disease, stage 4 (severe): Secondary | ICD-10-CM

## 2016-12-30 DIAGNOSIS — G4733 Obstructive sleep apnea (adult) (pediatric): Secondary | ICD-10-CM | POA: Diagnosis not present

## 2016-12-30 DIAGNOSIS — Z9989 Dependence on other enabling machines and devices: Secondary | ICD-10-CM | POA: Diagnosis not present

## 2016-12-30 DIAGNOSIS — I5032 Chronic diastolic (congestive) heart failure: Secondary | ICD-10-CM | POA: Diagnosis not present

## 2016-12-30 DIAGNOSIS — J449 Chronic obstructive pulmonary disease, unspecified: Secondary | ICD-10-CM

## 2016-12-30 DIAGNOSIS — E1121 Type 2 diabetes mellitus with diabetic nephropathy: Secondary | ICD-10-CM | POA: Diagnosis not present

## 2016-12-30 DIAGNOSIS — R600 Localized edema: Secondary | ICD-10-CM | POA: Diagnosis not present

## 2016-12-30 DIAGNOSIS — I10 Essential (primary) hypertension: Secondary | ICD-10-CM

## 2016-12-30 DIAGNOSIS — Z794 Long term (current) use of insulin: Secondary | ICD-10-CM

## 2016-12-30 DIAGNOSIS — R42 Dizziness and giddiness: Secondary | ICD-10-CM

## 2016-12-30 MED ORDER — METHYLPREDNISOLONE ACETATE 40 MG/ML IJ SUSP
40.0000 mg | Freq: Once | INTRAMUSCULAR | Status: AC
  Administered 2016-12-30: 40 mg via INTRAMUSCULAR

## 2016-12-30 NOTE — Patient Instructions (Addendum)
Depo-medrol 40mg  injection given today  Please wear your CPAP at bedtime every night  Follow up with specialists as scheduled  Continue Oxygen as ordered  Continue other medications as ordered  Recommend OTC plain claritin, allegra or zyrtec daily  Follow up in 4 mos for routine visit

## 2016-12-30 NOTE — Progress Notes (Signed)
Patient ID: Bahar Shelden, female   DOB: 01/21/1936, 81 y.o.   MRN: [id number]    Location:  [facility] Place of Service: OFFICE  Chief Complaint  Patient presents with  . Medical Management of Chronic Issues    3 month routine visit    HPI:  81 yo female seen today for f/u. She has trouble with insurance company and some of medications are no longer covered (?humulin, lidocaine and another med).  She requests letter to be given to her landlord stating she needs a handicap apt with adequate ramp.   She has trouble with her legs cramping.  She continues to have trouble with dizziness and gets lightheaded when she stands from seated position.   dizziness x several weeks - she is not complaint with CPAP and states it causes her to get "too much air in my stomach". Mask was changed to nasal but she continues to have issues. Sleep study revealed severe obstructive sleep apnea.  She continues to have trouble with dizziness and gets lightheaded when she stands from seated position.   Multiple joint pain - she reports diffuse arthralgias but especially in legs b/l. Pain is shooting. She also c/o pain in left shoulder that began after fall in  Some time ago. She hit her shoulder against faucet and bruised it. She c/o severe pain in left medial foot and unable bare weight on it. She had similar pain in right foot that req'd surgical correction. She does not feel she needs orthotic and does not feel it will help. She takes norco and uses voltaren gel. Also takes cymbalta and gabapentin. She has fallen since last OV - fall witnessed. She was attempting to climb into vehicle when it occurred. No head trauma nor other injury per pt. She did not seek medical attention  HTN - BP stable on metoprolol, hydralazine. Enalapril and clonidine stopped by nephrology  Hx CHF - stable on statin, diuretic. Takes asa daily. Followed by cardio   COPD/chronic respiratory failure - currently on duoneb, pulmicort, mucinex dm.  Uses HFA TID. Followed by pulmonary. She is O2 dependent  DM with neuropathy - A1c 7.1% . Insulin dependent (40 units in AM and 25 at PM). Takes gabapentin. BS fluctuating. FBS 302 this AM.  She has a low BS reaction at times. Followed by diabetic educator  Edema  - stable on demadex and lasix with Kdur  CKD/AOCD - stage 4. Cr 2.64. Hgb 9.9. Followed by nephrology Dr [name] at [facility]  GERD/constipation - stable on nexium. She has emesis with amitiza and miralax but still takes them   Past Medical History:  Diagnosis Date  . Acute bronchitis   . Anxiety   . Arthritis   . Chronic kidney disease, stage II (mild)   . Complications affecting other specified body systems, hypertension   . Congestive heart failure, unspecified    stress test in March at [facility]  . COPD (chronic obstructive pulmonary disease) ([place])    present for several years, diagnosed in the last few years  . Degenerative arthritis of knee, bilateral   . Diabetes mellitus   . Diaphragmatic hernia without mention of obstruction or gangrene   . Disorder of bone and cartilage, unspecified   . GERD (gastroesophageal reflux disease)   . Hiatal hernia   . Hyperlipidemia   . Hypertension   . Hypopotassemia   . Insomnia, unspecified   . Lumbago   . Obese   . Osteoarthrosis, unspecified whether generalized  or localized, unspecified site   . PONV (postoperative nausea and vomiting)   . Scoliosis   . Secondary diabetes mellitus with renal manifestations, not stated as uncontrolled, or unspecified(249.40)   . Unspecified glaucoma(365.9)   . Unspecified hereditary and idiopathic peripheral neuropathy     Past Surgical History:  Procedure Laterality Date  . ABDOMINAL HYSTERECTOMY    . COLONOSCOPY  08/06/2011  . FOOT SURGERY    . KNEE ARTHROSCOPY     bilateral  . SHOULDER SURGERY    . TOTAL HIP ARTHROPLASTY     bilateral    Patient Care Team: [name], NP as PCP -  General (Nurse Practitioner) [name], MD as Consulting Physician (Ophthalmology)  Social History   Social History  . Marital status: Widowed    Spouse name: N/A  . Number of children: N/A  . Years of education: N/A   Occupational History  . Retired Unemployed   Social History Main Topics  . Smoking status: Former Smoker    Packs/day: 0.50    Years: 3.00    Types: Cigarettes    Quit date: 02/27/1997  . Smokeless tobacco: Never Used     Comment: remote history - stopped 19 years ago; 20 pack year history  . Alcohol use No  . Drug use: No  . Sexual activity: No   Other Topics Concern  . Not on file   Social History Narrative   Widowed   No children   Never held a steady job     reports that she quit smoking about 19 years ago. Her smoking use included Cigarettes. She has a 1.50 pack-year smoking history. She has never used smokeless tobacco. She reports that she does not drink alcohol or use drugs.  Family History  Problem Relation Age of Onset  . Diabetes Mother   . Heart disease Mother   . Heart disease Brother   . Diabetes Brother   . Kidney disease Father   . Scoliosis Sister   . Diabetes Brother   . Diabetes Brother   . Heart disease Brother   . Scoliosis Brother   . Stroke Brother   . Heart attack Brother    Family Status  Relation Status  . Mother Deceased  . Brother Deceased  . Father Deceased  . Sister Alive  . Brother Alive  . Brother Alive  . Brother Alive  . Brother Alive  . Son Deceased  . Maternal Grandmother Deceased  . Maternal Grandfather Deceased  . Paternal Grandmother Deceased  . Paternal Grandfather Deceased     Allergies  Allergen Reactions  . Tramadol Other (See Comments)    Leg cramps   . Codeine Nausea And Vomiting    Patient states N/V with codeine  . Hydrocodone Nausea And Vomiting  . Penicillins Rash    Patient states rash/itch with penicillin Has patient had a PCN reaction causing immediate rash,  facial/tongue/throat swelling, SOB or lightheadedness with hypotension: Yes- broke me out and i was itching Has patient had a PCN reaction causing severe rash involving mucus membranes or skin necrosis: No Has patient had a PCN reaction that required hospitalization Yes- i was already in the hospital Has patient had a PCN reaction occurring within the last 10 years: No If all of the above answers are "NO  . Tylenol [Acetaminophen] Hives    Medications: Patient's Medications  New Prescriptions   No medications on file  Previous Medications   ALBUTEROL (PROVENTIL HFA;VENTOLIN HFA) 108 (90 BASE)  MCG/ACT INHALER    Inhale 2 puffs into the lungs every 4 (four) hours as needed for wheezing or shortness of breath.   AMITIZA 24 MCG CAPSULE    TAKE 1 CAPSULE BY MOUTH TWICE DAILY WITH MEALS   ASPIRIN EC 81 MG TABLET    Take 81 mg by mouth daily.   BUDESONIDE (PULMICORT) 0.25 MG/2ML NEBULIZER SOLUTION    Take one ampule in Neb twice daily routinely for COPD  J44.1   CHOLECALCIFEROL (VITAMIN D3) 2000 UNITS TABS    Take 2,000 Units by mouth daily.   CILOSTAZOL (PLETAL) 100 MG TABLET    TAKE 1 TABLET BY MOUTH EVERY DAY.   CYCLOSPORINE (RESTASIS) 0.05 % OPHTHALMIC EMULSION    Place 1 drop into both eyes 2 (two) times daily. One drop once a day bilaterally for dry eyes   DICLOFENAC SODIUM (VOLTAREN) 1 % GEL    APPLY 4 GRAM TOPICALLY TWICE DAILY as needed for pain   DULOXETINE (CYMBALTA) 30 MG CAPSULE    TAKE 1 CAPSULE BY MOUTH EVERY DAY TO HELP ANXIETY AND PAINS.   EASY TOUCH INSULIN SYRINGE 31G X 5/16" 0.5 ML MISC    USE UP TO 2 TIMES A DAY AS DIRECTED.   ESOMEPRAZOLE (NEXIUM) 40 MG CAPSULE    TAKE 1 CAPSULE BY MOUTH ONCE DAILY.   FUROSEMIDE (LASIX) 40 MG TABLET    Take 1 tablet (40 mg total) by mouth 2 (two) times daily.   GABAPENTIN (NEURONTIN) 100 MG CAPSULE    TAKE 2 CAPSULES BY MOUTH THREE TIMES A DAY   GUAIFENESIN (ROBITUSSIN) 100 MG/5ML SYRUP    Take 200 mg by mouth 3 (three) times daily as needed  for cough.   HUMULIN 70/30 (70-30) 100 UNIT/ML INJECTION    INJECT 40 UNITS SUBCUTANEOUSLY WITH BREAKFAST AND 25 UNITS SUBCUTANEOUSLY WITH DINNER TO CONTROL BLOOD SUGAR.   HYDRALAZINE (APRESOLINE) 50 MG TABLET    TAKE 1 TABLET BY MOUTH THREE TIMES A DAY.   HYDROCODONE-ACETAMINOPHEN (NORCO) 10-325 MG TABLET    Take 1 tablet by mouth every 6 (six) hours as needed.   IPRATROPIUM-ALBUTEROL (DUONEB) 0.5-2.5 (3) MG/3ML SOLN    Take 88ms by nebulization every 6 hours as needed for SOB/Wheezing   LINACLOTIDE (LINZESS) 145 MCG CAPS CAPSULE    Take 145 mcg by mouth 2 (two) times daily.   METOPROLOL SUCCINATE (TOPROL-XL) 25 MG 24 HR TABLET    TAKE 1 TABLET BY MOUTH EVERY DAY WITH OR IMMEDIATELY FOLLOWING A MEAL FOR BLOOD PRESSURE.   POTASSIUM CHLORIDE SA (K-DUR,KLOR-CON) 20 MEQ TABLET    TAKE 1 TABLET BY MOUTH EVERY DAY   SERTRALINE (ZOLOFT) 50 MG TABLET    TAKE 1 TABLET BY MOUTH EVERY DAY.   SIMVASTATIN (ZOCOR) 20 MG TABLET    TAKE 1 TABLET BY MOUTH EVERY DAY.   SUCRALFATE (CARAFATE) 1 G TABLET    TAKE 1 TABLET BY MOUTH TWICE DAILY ON EMPTY STOMACH.   TIMOLOL (TIMOPTIC) 0.5 % OPHTHALMIC SOLUTION    INSTILL 1 DROP INTO EACH EYE TWICE A DAY   TRAVATAN Z 0.004 % SOLN OPHTHALMIC SOLUTION    Place 1 drop into both eyes at bedtime.   Modified Medications   No medications on file  Discontinued Medications   No medications on file    Review of Systems  Constitutional: Positive for fatigue.  Respiratory: Positive for chest tightness, shortness of breath and wheezing.   Cardiovascular: Positive for leg swelling.  Gastrointestinal: Positive for abdominal distention.  Musculoskeletal:  Positive for arthralgias, back pain and gait problem.  All other systems reviewed and are negative.   Vitals:   12/30/16 1425  BP: 124/70  Pulse: 69  Temp: 97.6 F (36.4 C)  TempSrc: Oral  SpO2: 91%  Weight: 212 lb 9.6 oz (96.4 kg)  Height: _0  (1.575 m)   Body mass index is 38.89 kg/m.  Physical Exam    Constitutional: She is oriented to person, place, and time. She appears well-developed and well-nourished.  HENT:  Mouth/Throat: Oropharynx is clear and moist. No oropharyngeal exudate.  Eyes: Pupils are equal, round, and reactive to light. No scleral icterus.  Neck: Neck supple. Carotid bruit is not present. No tracheal deviation present. No thyromegaly present.  Cardiovascular: Normal rate, regular rhythm and intact distal pulses.  Exam reveals no gallop and no friction rub.   Murmur (1/6 SEM) heard. +1 pitting LE edema b/l. No calf TTP  Pulmonary/Chest: No stridor. No respiratory distress. She has decreased breath sounds (b/l at base). She has wheezes (b/l end expiratory with prolonged expiratory phase). She has no rales.  Poor inspiratory effort  Abdominal: Soft. Bowel sounds are normal. She exhibits distension. She exhibits no mass. There is no hepatomegaly. There is no tenderness. There is no rebound and no guarding.  obese  Musculoskeletal: She exhibits edema and tenderness.  Uses rolling walker; left ankle and foot swelling with reduced ROM and TTP; antalgic gait; increased warmth to touch  Lymphadenopathy:    She has no cervical adenopathy.  Neurological: She is alert and oriented to person, place, and time.  Gait antalgic  Skin: Skin is warm and dry. No rash noted.  Psychiatric: She has a normal mood and affect. Her behavior is normal. Thought content normal.     Labs reviewed: Office Visit on 12/14/2016  Component Date Value Ref Range Status  . Hgb A1c MFr Bld 12/14/2016 7.1* <5.7 % Final   Comment:   For someone without known diabetes, a hemoglobin A1c value of 6.5% or greater indicates that they may have diabetes and this should be confirmed with a follow-up test.   For someone with known diabetes, a value <7% indicates that their diabetes is well controlled and a value greater than or equal to 7% indicates suboptimal control. A1c targets should be  individualized based on duration of diabetes, age, comorbid conditions, and other considerations.   Currently, no consensus exists for use of hemoglobin A1c for diagnosis of diabetes for children.     . Mean Plasma Glucose 12/14/2016 157  mg/dL Final  . Sodium 12/14/2016 139  135 - 146 mmol/L Final  . Potassium 12/14/2016 3.8  3.5 - 5.3 mmol/L Final  . Chloride 12/14/2016 100  98 - 110 mmol/L Final  . CO2 12/14/2016 27  20 - 31 mmol/L Final  . Glucose, Bld 12/14/2016 161* 65 - 99 mg/dL Final  . BUN 12/14/2016 36* 7 - 25 mg/dL Final  . Creat 12/14/2016 2.64* 0.60 - 0.93 mg/dL Final   Comment:   For patients > or = 79 years of age: The upper reference limit for Creatinine is approximately 13% higher for people identified as African-American.     . Total Bilirubin 12/14/2016 0.3  0.2 - 1.2 mg/dL Final  . Alkaline Phosphatase 12/14/2016 90  33 - 130 U/L Final  . AST 12/14/2016 18  10 - 35 U/L Final  . ALT 12/14/2016 8  6 - 29 U/L Final  . Total Protein 12/14/2016 7.0  6.1 - 8.1 g/dL  Final  . Albumin 12/14/2016 3.9  3.6 - 5.1 g/dL Final  . Calcium 12/14/2016 9.4  8.6 - 10.4 mg/dL Final  . GFR, Est African American 12/14/2016 19* >=60 mL/min Final  . GFR, Est Non African American 12/14/2016 17* >=60 mL/min Final  Office Visit on 10/06/2016  Component Date Value Ref Range Status  . WBC 10/06/2016 9.7  3.8 - 10.8 K/uL Final  . RBC 10/06/2016 3.96  3.80 - 5.10 MIL/uL Final  . Hemoglobin 10/06/2016 10.3* 11.7 - 15.5 g/dL Final  . HCT 10/06/2016 32.3* 35.0 - 45.0 % Final  . MCV 10/06/2016 81.6  80.0 - 100.0 fL Final  . MCH 10/06/2016 26.0* 27.0 - 33.0 pg Final  . MCHC 10/06/2016 31.9* 32.0 - 36.0 g/dL Final  . RDW 10/06/2016 17.0* 11.0 - 15.0 % Final  . Platelets 10/06/2016 252  140 - 400 K/uL Final  . MPV 10/06/2016 10.4  7.5 - 12.5 fL Final  . Neutro Abs 10/06/2016 6208  1,500 - 7,800 cells/uL Final  . Lymphs Abs 10/06/2016 2716  850 - 3,900 cells/uL Final  . Monocytes Absolute  10/06/2016 582  200 - 950 cells/uL Final  . Eosinophils Absolute 10/06/2016 194  15 - 500 cells/uL Final  . Basophils Absolute 10/06/2016 0  0 - 200 cells/uL Final  . Neutrophils Relative % 10/06/2016 64  % Final  . Lymphocytes Relative 10/06/2016 28  % Final  . Monocytes Relative 10/06/2016 6  % Final  . Eosinophils Relative 10/06/2016 2  % Final  . Basophils Relative 10/06/2016 0  % Final  . Smear Review 10/06/2016 Criteria for review not met   Final  . Sodium 10/06/2016 135  135 - 146 mmol/L Final  . Potassium 10/06/2016 4.1  3.5 - 5.3 mmol/L Final  . Chloride 10/06/2016 96* 98 - 110 mmol/L Final  . CO2 10/06/2016 28  20 - 31 mmol/L Final  . Glucose, Bld 10/06/2016 206* 65 - 99 mg/dL Final  . BUN 10/06/2016 46* 7 - 25 mg/dL Final  . Creat 10/06/2016 2.78* 0.60 - 0.93 mg/dL Final   Comment:   For patients > or = 79 years of age: The upper reference limit for Creatinine is approximately 13% higher for people identified as African-American.     . Calcium 10/06/2016 9.1  8.6 - 10.4 mg/dL Final  . GFR, Est African American 10/06/2016 18* >=60 mL/min Final  . GFR, Est Non African American 10/06/2016 16* >=60 mL/min Final    No results found.   Assessment/Plan   ICD-9-CM ICD-10-CM   1. Dizziness 780.4 R42   2. OSA on CPAP 327.23 G47.33    V46.8 Z99.89    severe; noncompliant  3. Chronic obstructive pulmonary disease, unspecified COPD type (HCC) 496 J44.9 methylPREDNISolone acetate (DEPO-MEDROL) injection 40 mg  4. Chronic diastolic congestive heart failure (HCC) 428.32 I50.32    428.0    5. Chronic kidney disease (CKD), stage IV (severe) (HCC) 585.4 N18.4   6. Essential hypertension, benign 401.1 I10   7. Type 2 diabetes mellitus with diabetic nephropathy, with long-term current use of insulin (HCC) 250.40 E11.21    583.81 Z79.4    V58.67    8. Bilateral edema of lower extremity 782.3 R60.0     letter to [employer] to be written and mailed to pt. She requests a larger,  handicap accessible apt  Depo-medrol 45m injection given today  Please wear your CPAP at bedtime every night  Follow up with specialists as scheduled  Continue  Oxygen as ordered  Continue other medications as ordered  F/u with diabetic educator as scheduled  Follow up in 4 mos for routine visit   Rowan Pollman S. Perlie Gold  [facility] [address] [place], [place] [address] [phone] Cell (Monday-Friday 8 AM - 5 PM) [phone] After 5 PM and follow prompts

## 2016-12-30 NOTE — Telephone Encounter (Signed)
Left voicemail for patient to let her know that she is not suppose to be on clonidine, enalapril or torsemide per her ED visit.

## 2017-01-01 ENCOUNTER — Telehealth: Payer: Self-pay

## 2017-01-01 NOTE — Telephone Encounter (Signed)
Spoke with patient's sister and notified her that the letter was mailed on Wednesday she expressed understanding.

## 2017-01-01 NOTE — Telephone Encounter (Signed)
Message left on clinical intake voicemail:   Patient was seen recently and needs a letter for a handicap apartment.  Patient's sister was calling to get a status update.  Please advise when letter was mailed

## 2017-01-11 ENCOUNTER — Telehealth: Payer: Self-pay

## 2017-01-11 NOTE — Telephone Encounter (Signed)
A fax was received from [employer] requesting refills on patient's diabetic testing supplies. I called patient and she does not know why this company has her information but she did not request any medications or supplies from them. She asked me to shred the fax.   Fax was placed in shred bin.

## 2017-01-15 DIAGNOSIS — J449 Chronic obstructive pulmonary disease, unspecified: Secondary | ICD-10-CM | POA: Diagnosis not present

## 2017-01-21 ENCOUNTER — Other Ambulatory Visit: Payer: Self-pay | Admitting: Nurse Practitioner

## 2017-01-25 ENCOUNTER — Other Ambulatory Visit: Payer: Self-pay | Admitting: Nurse Practitioner

## 2017-01-25 ENCOUNTER — Telehealth: Payer: Self-pay

## 2017-01-25 DIAGNOSIS — J441 Chronic obstructive pulmonary disease with (acute) exacerbation: Secondary | ICD-10-CM

## 2017-01-25 NOTE — Telephone Encounter (Signed)
Message left on clinical intake voicemail:   Patient called to see when pain medication refill is due, patient requested return call.  I called patient and left message on voicemail informing her pain medication is due Thursday 01/28/17. Patient informed we will call her once rx is signed and ready for pick-up.

## 2017-01-27 ENCOUNTER — Encounter: Payer: Self-pay | Admitting: Cardiovascular Disease

## 2017-01-27 ENCOUNTER — Ambulatory Visit (INDEPENDENT_AMBULATORY_CARE_PROVIDER_SITE_OTHER): Payer: Medicare HMO | Admitting: Cardiovascular Disease

## 2017-01-27 VITALS — BP 130/64 | HR 67 | Ht 65.0 in | Wt 212.0 lb

## 2017-01-27 DIAGNOSIS — R0789 Other chest pain: Secondary | ICD-10-CM | POA: Diagnosis not present

## 2017-01-27 DIAGNOSIS — E6609 Other obesity due to excess calories: Secondary | ICD-10-CM | POA: Diagnosis not present

## 2017-01-27 DIAGNOSIS — E7800 Pure hypercholesterolemia, unspecified: Secondary | ICD-10-CM

## 2017-01-27 DIAGNOSIS — I110 Hypertensive heart disease with heart failure: Secondary | ICD-10-CM

## 2017-01-27 NOTE — Patient Instructions (Signed)
Medication Instructions:  Your physician recommends that you continue on your current medications as directed. Please refer to the Current Medication list given to you today.  Labwork: NONE  Testing/Procedures: NONE  Follow-Up: Your physician wants you to follow-up in: 6 MONTH  You will receive a reminder letter in the mail two months in advance. If you don't receive a letter, please call our office to schedule the follow-up appointment.  If you need a refill on your cardiac medications before your next appointment, please call your pharmacy.

## 2017-01-27 NOTE — Progress Notes (Signed)
Cardiology Office Note   Date:  01/27/2017   ID:  Michelle Buck, DOB 04-01-1936, MRN [id number]  PCP:  [name], NP  Cardiologist:   Skeet Latch, MD   Chief Complaint  Patient presents with  . Follow-up  . Shortness of Breath    when walking long distance.  . Dizziness    when getting up.  . Leg Pain    pain in leg.  . Edema    legs, feet, and ankles.      History of Present Illness: Michelle Buck is a 81 y.o. female ith hypertension, diabetes, COPD, CKD IV, chronic diastolic heart failure who presents for follow up.  Ms. Longstreth was admitted to the hospital from 08/2015 with hypoxia thought to be due to acute on chronic diastolic heart failure.  She initially went to the hospital because her home health nurse noted lower extremity edema and shortness of breath..  She was diuresed with Lasix IV and discharged with lasix 40mg  po bid.  Her discharge weight was 103 kg.  at her follow-up appointment she reported chest pain and shortness breath. She was referred for a Lexiscan Myoview 12/20/15 that was negative for ischemia and revealed LVEF 69%.  Since that appointment Ms. Crabbe was admitted 08/2016 with a heart failure exacerbation.  Echo that admission revealed LVEF 60-65% with grade 2 diastolic dysfunction.  At her last appointment her BP was slightly above goal.  However, her antihypertensives were not titrated due to dizziness.  Since her last appointment she continues to note chest pain and shortness of breath. On his last for hours and is worse with turning her body. It improves with hydrocodone.  She has stable lower extremity edema. She does not have evidence shortness of breath when wearing her oxygen. However she has been out of her oxygen for the last several days. She denies orthopnea or PND. Since her last appointment she stopped drinking soda.  Past Medical History:  Diagnosis Date  . Acute bronchitis   . Anxiety   . Arthritis   . Chronic kidney disease,  stage II (mild)   . Complications affecting other specified body systems, hypertension   . Congestive heart failure, unspecified    stress test in March at [facility]  . COPD (chronic obstructive pulmonary disease) ([place])    present for several years, diagnosed in the last few years  . Degenerative arthritis of knee, bilateral   . Diabetes mellitus   . Diaphragmatic hernia without mention of obstruction or gangrene   . Disorder of bone and cartilage, unspecified   . GERD (gastroesophageal reflux disease)   . Hiatal hernia   . Hyperlipidemia   . Hypertension   . Hypopotassemia   . Insomnia, unspecified   . Lumbago   . Obese   . Osteoarthrosis, unspecified whether generalized or localized, unspecified site   . PONV (postoperative nausea and vomiting)   . Scoliosis   . Secondary diabetes mellitus with renal manifestations, not stated as uncontrolled, or unspecified(249.40)   . Unspecified glaucoma(365.9)   . Unspecified hereditary and idiopathic peripheral neuropathy     Past Surgical History:  Procedure Laterality Date  . ABDOMINAL HYSTERECTOMY    . COLONOSCOPY  08/06/2011  . FOOT SURGERY    . KNEE ARTHROSCOPY     bilateral  . SHOULDER SURGERY    . TOTAL HIP ARTHROPLASTY     bilateral     Current Outpatient Prescriptions  Medication Sig Dispense Refill  . albuterol (PROVENTIL  HFA;VENTOLIN HFA) 108 (90 Base) MCG/ACT inhaler Inhale 2 puffs into the lungs every 4 (four) hours as needed for wheezing or shortness of breath.    . AMITIZA 24 MCG capsule TAKE 1 CAPSULE BY MOUTH TWICE DAILY WITH MEALS 60 capsule 4  . aspirin EC 81 MG tablet Take 81 mg by mouth daily.    . budesonide (PULMICORT) 0.25 MG/2ML nebulizer solution Take one ampule in Neb twice daily routinely for COPD  J44.1 120 mL 6  . Cholecalciferol (VITAMIN D3) 2000 units TABS Take 2,000 Units by mouth daily.    . cilostazol (PLETAL) 100 MG tablet TAKE 1 TABLET BY MOUTH EVERY DAY. 30 tablet 4  . cycloSPORINE  (RESTASIS) 0.05 % ophthalmic emulsion Place 1 drop into both eyes 2 (two) times daily. One drop once a day bilaterally for dry eyes    . diclofenac sodium (VOLTAREN) 1 % GEL APPLY 4 GRAM TOPICALLY TWICE DAILY as needed for pain 200 g 6  . DULoxetine (CYMBALTA) 30 MG capsule TAKE 1 CAPSULE BY MOUTH EVERY DAY TO HELP ANXIETY AND PAINS. 30 capsule 4  . EASY TOUCH INSULIN SYRINGE 31G X 5/16" 0.5 ML MISC USE UP TO 2 TIMES A DAY AS DIRECTED. 100 each 11  . esomeprazole (NEXIUM) 40 MG capsule TAKE 1 CAPSULE BY MOUTH ONCE DAILY. 30 capsule 4  . furosemide (LASIX) 40 MG tablet TAKE 1 TABLET BY MOUTH TWICE A DAY 60 tablet 1  . gabapentin (NEURONTIN) 100 MG capsule TAKE 2 CAPSULES BY MOUTH THREE TIMES A DAY 180 capsule 4  . guaifenesin (ROBITUSSIN) 100 MG/5ML syrup Take 200 mg by mouth 3 (three) times daily as needed for cough.    Marland Kitchen HUMULIN 70/30 (70-30) 100 UNIT/ML injection INJECT 40 UNITS SUBCUTANEOUSLY WITH BREAKFAST AND 25 UNITS SUBCUTANEOUSLY WITH DINNER TO CONTROL BLOOD SUGAR. 20 vial 2  . hydrALAZINE (APRESOLINE) 50 MG tablet TAKE 1 TABLET BY MOUTH THREE TIMES A DAY. 90 tablet 3  . HYDROcodone-acetaminophen (NORCO) 10-325 MG tablet Take 1 tablet by mouth every 6 (six) hours as needed. 120 tablet 0  . ipratropium-albuterol (DUONEB) 0.5-2.5 (3) MG/3ML SOLN Take 50mls by nebulization every 6 hours as needed for SOB/Wheezing 360 mL 3  . linaclotide (LINZESS) 145 MCG CAPS capsule Take 145 mcg by mouth 2 (two) times daily.    . metoprolol succinate (TOPROL-XL) 25 MG 24 hr tablet TAKE 1 TABLET BY MOUTH EVERY DAY WITH OR IMMEDIATELY FOLLOWING A MEAL FOR BLOOD PRESSURE. 30 tablet 4  . potassium chloride SA (K-DUR,KLOR-CON) 20 MEQ tablet TAKE 1 TABLET BY MOUTH ONCE DAILY 30 tablet 1  . sertraline (ZOLOFT) 50 MG tablet TAKE 1 TABLET BY MOUTH ONCE DAILY 30 tablet 1  . simvastatin (ZOCOR) 20 MG tablet TAKE 1 TABLET BY MOUTH EVERY DAY. 30 tablet 4  . sucralfate (CARAFATE) 1 g tablet TAKE 1 TABLET BY MOUTH TWICE DAILY  ON EMPTY STOMACH. 60 tablet 1  . timolol (TIMOPTIC) 0.5 % ophthalmic solution INSTILL 1 DROP INTO EACH EYE TWICE A DAY 5 mL 4  . TRAVATAN Z 0.004 % SOLN ophthalmic solution Place 1 drop into both eyes at bedtime.      No current facility-administered medications for this visit.     Allergies:   Tramadol; Codeine; Hydrocodone; Penicillins; and Tylenol [acetaminophen]    Social History:  The patient  reports that she quit smoking about 19 years ago. Her smoking use included Cigarettes. She has a 1.50 pack-year smoking history. She has never used smokeless tobacco. She  reports that she does not drink alcohol or use drugs.   Family History:  The patient's family history includes Diabetes in her brother, brother, brother, and mother; Heart attack in her brother; Heart disease in her brother, brother, and mother; Kidney disease in her father; Scoliosis in her brother and sister; Stroke in her brother.    ROS:  Please see the history of present illness.   Otherwise, review of systems are positive for none.   All other systems are reviewed and negative.    PHYSICAL EXAM: VS:  BP 130/64   Pulse 67   Ht 5\' 5"  (1.651 m)   Wt 96.2 kg (212 lb)   BMI 35.28 kg/m  , BMI Body mass index is 35.28 kg/m. GENERAL:  Well appearing.  Frequent coughing throughout the exam. HEENT:  Pupils equal round and reactive, fundi not visualized, oral mucosa unremarkable NECK:  No jugular venous distention, waveform within normal limits, carotid upstroke brisk and symmetric, no bruits LYMPHATICS:  No cervical adenopathy LUNGS:  Coarse breath sounds and expiratory wheezing bilaterally. Decreased air movement throughout. HEART:  RRR.  PMI not displaced or sustained,S1 and S2 within normal limits, no S3, no S4, no clicks, no rubs, no murmurs ABD:  Flat, positive bowel sounds normal in frequency in pitch, no bruits, no rebound, no guarding, no midline pulsatile mass, no hepatomegaly, no splenomegaly EXT:  2 plus pulses  throughout, trace edema, no cyanosis no clubbing SKIN:  No rashes no nodules NEURO:  Cranial nerves II through XII grossly intact, motor grossly intact throughout PSYCH:  Cognitively intact, oriented to person place and time  EKG:  EKG is not ordered today.  Echo 09/25/16: Study Conclusions  - Left ventricle: The cavity size was normal. Wall thickness was   normal. Systolic function was normal. The estimated ejection   fraction was in the range of 60% to 65%. Wall motion was normal;   there were no regional wall motion abnormalities. Features are   consistent with a pseudonormal left ventricular filling pattern,   with concomitant abnormal relaxation and increased filling   pressure (grade 2 diastolic dysfunction). - Left atrium: The atrium was mildly dilated. - Right atrium: The atrium was mildly dilated.  Lexiscan Myoview 12/20/15:  Nuclear stress EF: 69%.  The left ventricular ejection fraction is hyperdynamic (>65%).  The study is normal.  This is a low risk study.  Recent Labs: 09/24/2016: B Natriuretic Peptide 78.2 10/06/2016: Hemoglobin 10.3; Platelets 252 12/14/2016: ALT 8; BUN 36; Creat 2.64; Potassium 3.8; Sodium 139    Lipid Panel    Component Value Date/Time   CHOL 147 03/14/2015 0806   TRIG 219 (H) 03/14/2015 0806   HDL 41 03/14/2015 0806   CHOLHDL 3.6 03/14/2015 0806   CHOLHDL 3.0 Ratio 05/14/2010 2347   VLDL 40 05/14/2010 2347   LDLCALC 62 03/14/2015 0806      Wt Readings from Last 3 Encounters:  01/27/17 96.2 kg (212 lb)  12/30/16 96.4 kg (212 lb 9.6 oz)  12/14/16 85.7 kg (189 lb)      ASSESSMENT AND PLAN:  # Chronic diastolic heart failure:  # Hypertensive heart disease: # Shortness of breath: Ms. Ogden is euvolemic and her weight is stable.  BNP was 78 08/2016 and her symptoms are unchanged.  No repeat echo at this time.   She was advised to continue wearing herself daily and to avoid salt. Continue hydralazine, metoprolol, and lasix.  She  is not on an ACE-I/ARB due to chronic kidney  disease. She is wheezing on exam and I suspect that much of her shortness of breath is due to her chronic lung disease.  # Atypical chest pain: Lexiscan Myoview was negative 12/2015 and symptoms are unchanged. Continue aspirin and simvastatin.  # Hyperlipidemia: Continue simvastatin.  This is managed by her PCP.  Current medicines are reviewed at length with the patient today.  The patient does not have concerns regarding medicines.  The following changes have been made:  no change  Labs/ tests ordered today include:   No orders of the defined types were placed in this encounter.    Disposition:   FU with Betta Balla C. Oval Linsey, MD, [facility] in 6 months   Signed, Shadasia Oldfield C. Oval Linsey, MD, [facility]  01/27/2017 1:08 PM    [facility]

## 2017-01-28 ENCOUNTER — Telehealth: Payer: Self-pay

## 2017-01-28 ENCOUNTER — Other Ambulatory Visit: Payer: Self-pay

## 2017-01-28 DIAGNOSIS — M1990 Unspecified osteoarthritis, unspecified site: Secondary | ICD-10-CM | POA: Diagnosis not present

## 2017-01-28 DIAGNOSIS — G8929 Other chronic pain: Secondary | ICD-10-CM

## 2017-01-28 DIAGNOSIS — M545 Low back pain: Principal | ICD-10-CM

## 2017-01-28 MED ORDER — HYDROCODONE-ACETAMINOPHEN 10-325 MG PO TABS: 1.0000 | ORAL_TABLET | Freq: Four times a day (QID) | ORAL | 0 refills | Status: DC | PRN

## 2017-01-28 NOTE — Telephone Encounter (Signed)
Left voicemail to notify patient that prescription is ready for pick up.

## 2017-01-29 DIAGNOSIS — M1990 Unspecified osteoarthritis, unspecified site: Secondary | ICD-10-CM | POA: Diagnosis not present

## 2017-02-01 DIAGNOSIS — M1990 Unspecified osteoarthritis, unspecified site: Secondary | ICD-10-CM | POA: Diagnosis not present

## 2017-02-02 DIAGNOSIS — M1990 Unspecified osteoarthritis, unspecified site: Secondary | ICD-10-CM | POA: Diagnosis not present

## 2017-02-03 DIAGNOSIS — M1990 Unspecified osteoarthritis, unspecified site: Secondary | ICD-10-CM | POA: Diagnosis not present

## 2017-02-04 DIAGNOSIS — M1990 Unspecified osteoarthritis, unspecified site: Secondary | ICD-10-CM | POA: Diagnosis not present

## 2017-02-05 DIAGNOSIS — M1990 Unspecified osteoarthritis, unspecified site: Secondary | ICD-10-CM | POA: Diagnosis not present

## 2017-02-06 ENCOUNTER — Other Ambulatory Visit: Payer: Self-pay | Admitting: Nurse Practitioner

## 2017-02-06 DIAGNOSIS — J441 Chronic obstructive pulmonary disease with (acute) exacerbation: Secondary | ICD-10-CM

## 2017-02-08 DIAGNOSIS — M1990 Unspecified osteoarthritis, unspecified site: Secondary | ICD-10-CM | POA: Diagnosis not present

## 2017-02-09 DIAGNOSIS — M1990 Unspecified osteoarthritis, unspecified site: Secondary | ICD-10-CM | POA: Diagnosis not present

## 2017-02-10 DIAGNOSIS — M1990 Unspecified osteoarthritis, unspecified site: Secondary | ICD-10-CM | POA: Diagnosis not present

## 2017-02-11 DIAGNOSIS — M1990 Unspecified osteoarthritis, unspecified site: Secondary | ICD-10-CM | POA: Diagnosis not present

## 2017-02-12 DIAGNOSIS — J449 Chronic obstructive pulmonary disease, unspecified: Secondary | ICD-10-CM | POA: Diagnosis not present

## 2017-02-12 DIAGNOSIS — M1990 Unspecified osteoarthritis, unspecified site: Secondary | ICD-10-CM | POA: Diagnosis not present

## 2017-02-15 DIAGNOSIS — M1990 Unspecified osteoarthritis, unspecified site: Secondary | ICD-10-CM | POA: Diagnosis not present

## 2017-02-16 DIAGNOSIS — M1990 Unspecified osteoarthritis, unspecified site: Secondary | ICD-10-CM | POA: Diagnosis not present

## 2017-02-17 DIAGNOSIS — M1990 Unspecified osteoarthritis, unspecified site: Secondary | ICD-10-CM | POA: Diagnosis not present

## 2017-02-18 ENCOUNTER — Other Ambulatory Visit: Payer: Self-pay | Admitting: Internal Medicine

## 2017-02-18 DIAGNOSIS — M1990 Unspecified osteoarthritis, unspecified site: Secondary | ICD-10-CM | POA: Diagnosis not present

## 2017-02-19 DIAGNOSIS — J449 Chronic obstructive pulmonary disease, unspecified: Secondary | ICD-10-CM | POA: Diagnosis not present

## 2017-02-19 DIAGNOSIS — M1990 Unspecified osteoarthritis, unspecified site: Secondary | ICD-10-CM | POA: Diagnosis not present

## 2017-02-22 ENCOUNTER — Telehealth: Payer: Self-pay | Admitting: *Deleted

## 2017-02-22 DIAGNOSIS — M1990 Unspecified osteoarthritis, unspecified site: Secondary | ICD-10-CM | POA: Diagnosis not present

## 2017-02-22 MED ORDER — INSULIN ASPART PROT & ASPART (70-30 MIX) 100 UNIT/ML PEN: PEN_INJECTOR | SUBCUTANEOUS | 11 refills | Status: DC

## 2017-02-22 NOTE — Telephone Encounter (Signed)
Barbara,sister called and stated that patient's Humulin 70/30 is not covered by her insurance and would like it changed to something different. Please Advise.

## 2017-02-22 NOTE — Telephone Encounter (Signed)
Spoke with Michelle Buck and she changed insulin to Novolog Mix 70/30 with same directions.   Faxed to pharmacy and daughter notified and agreed.

## 2017-02-23 ENCOUNTER — Other Ambulatory Visit: Payer: Self-pay | Admitting: *Deleted

## 2017-02-23 DIAGNOSIS — M1990 Unspecified osteoarthritis, unspecified site: Secondary | ICD-10-CM | POA: Diagnosis not present

## 2017-02-23 MED ORDER — INSULIN ASPART PROT & ASPART (70-30 MIX) 100 UNIT/ML ~~LOC~~ SUSP: SUBCUTANEOUS | 11 refills | Status: DC

## 2017-02-23 NOTE — Telephone Encounter (Signed)
Received fax from [facility] stating that patient request the insulin vial instead of pens. Refaxed Rx for vial instead of pen.

## 2017-02-24 DIAGNOSIS — M1990 Unspecified osteoarthritis, unspecified site: Secondary | ICD-10-CM | POA: Diagnosis not present

## 2017-02-24 NOTE — Telephone Encounter (Signed)
Will with [facility] called and left message on voicemail to confirm patient's insulin Rx I called back and spoke with Joellen Jersey and confirmed that Humulin 70/30 was changed to Novolog Mix 70/30 due to insurance wouldn't cover the Humulin per patient.

## 2017-02-25 DIAGNOSIS — M1990 Unspecified osteoarthritis, unspecified site: Secondary | ICD-10-CM | POA: Diagnosis not present

## 2017-02-26 DIAGNOSIS — M1990 Unspecified osteoarthritis, unspecified site: Secondary | ICD-10-CM | POA: Diagnosis not present

## 2017-03-01 ENCOUNTER — Other Ambulatory Visit: Payer: Self-pay | Admitting: *Deleted

## 2017-03-01 DIAGNOSIS — M545 Low back pain: Principal | ICD-10-CM

## 2017-03-01 DIAGNOSIS — G8929 Other chronic pain: Secondary | ICD-10-CM

## 2017-03-01 MED ORDER — HYDROCODONE-ACETAMINOPHEN 10-325 MG PO TABS: 1.0000 | ORAL_TABLET | Freq: Four times a day (QID) | ORAL | 0 refills | Status: DC | PRN

## 2017-03-01 NOTE — Telephone Encounter (Signed)
Patient requested and will pick up 

## 2017-03-01 NOTE — Telephone Encounter (Signed)
Patient informed rx signed and ready for pickup

## 2017-03-08 DIAGNOSIS — H401131 Primary open-angle glaucoma, bilateral, mild stage: Secondary | ICD-10-CM | POA: Diagnosis not present

## 2017-03-15 ENCOUNTER — Ambulatory Visit (INDEPENDENT_AMBULATORY_CARE_PROVIDER_SITE_OTHER): Payer: Medicare HMO | Admitting: Pharmacotherapy

## 2017-03-15 ENCOUNTER — Encounter: Payer: Self-pay | Admitting: Pharmacotherapy

## 2017-03-15 ENCOUNTER — Other Ambulatory Visit: Payer: Self-pay

## 2017-03-15 VITALS — BP 110/64 | HR 68 | Resp 14 | Ht 65.0 in | Wt 219.0 lb

## 2017-03-15 DIAGNOSIS — I10 Essential (primary) hypertension: Secondary | ICD-10-CM

## 2017-03-15 DIAGNOSIS — Z794 Long term (current) use of insulin: Secondary | ICD-10-CM

## 2017-03-15 DIAGNOSIS — E1121 Type 2 diabetes mellitus with diabetic nephropathy: Secondary | ICD-10-CM

## 2017-03-15 DIAGNOSIS — N184 Chronic kidney disease, stage 4 (severe): Secondary | ICD-10-CM | POA: Diagnosis not present

## 2017-03-15 DIAGNOSIS — R300 Dysuria: Secondary | ICD-10-CM

## 2017-03-15 DIAGNOSIS — J449 Chronic obstructive pulmonary disease, unspecified: Secondary | ICD-10-CM | POA: Diagnosis not present

## 2017-03-15 LAB — COMPLETE METABOLIC PANEL WITH GFR
ALT: 10 U/L (ref 6–29)
AST: 19 U/L (ref 10–35)
Albumin: 3.6 g/dL (ref 3.6–5.1)
Alkaline Phosphatase: 83 U/L (ref 33–130)
BUN: 25 mg/dL (ref 7–25)
CO2: 31 mmol/L (ref 20–31)
Calcium: 9.2 mg/dL (ref 8.6–10.4)
Chloride: 101 mmol/L (ref 98–110)
Creat: 2.22 mg/dL — ABNORMAL HIGH (ref 0.60–0.93)
GFR, Est African American: 24 mL/min — ABNORMAL LOW (ref >=60)
GFR, Est Non African American: 21 mL/min — ABNORMAL LOW (ref >=60)
Glucose, Bld: 102 mg/dL — ABNORMAL HIGH (ref 65–99)
Potassium: 3.5 mmol/L (ref 3.5–5.3)
Sodium: 142 mmol/L (ref 135–146)
Total Bilirubin: 0.2 mg/dL (ref 0.2–1.2)
Total Protein: 6.7 g/dL (ref 6.1–8.1)

## 2017-03-15 LAB — URINALYSIS, ROUTINE W REFLEX MICROSCOPIC
Bilirubin Urine: NEGATIVE
Glucose, UA: NEGATIVE
Hgb urine dipstick: NEGATIVE
Ketones, ur: NEGATIVE
Leukocytes, UA: NEGATIVE
Nitrite: NEGATIVE
Protein, ur: NEGATIVE
Specific Gravity, Urine: 1.01 (ref 1.001–1.035)
pH: 5.5 (ref 5.0–8.0)

## 2017-03-15 MED ORDER — POTASSIUM CHLORIDE CRYS ER 20 MEQ PO TBCR: 20.0000 meq | EXTENDED_RELEASE_TABLET | Freq: Every day | ORAL | 1 refills | Status: DC

## 2017-03-15 MED ORDER — SERTRALINE HCL 50 MG PO TABS: 50.0000 mg | ORAL_TABLET | Freq: Every day | ORAL | 1 refills | Status: DC

## 2017-03-15 MED ORDER — HYDRALAZINE HCL 50 MG PO TABS: 50.0000 mg | ORAL_TABLET | Freq: Three times a day (TID) | ORAL | 1 refills | Status: DC

## 2017-03-15 NOTE — Progress Notes (Signed)
  Subjective:    Michelle Buck is a 81 y.o. African American female who presents for follow-up of Type 2 diabetes mellitus.   Last A1C was 7.1% (12/14/16) Currently taking 70/30 insulin - 40 units AM, 25 units PM  Did not get labs done prior to visit.  Her BG was 43m/dl this morning. Did not bring blood glucose meter. She reports BG 120-150 range. No hypoglycemia.  Denies missed insulin doses. Trying to make healthy food choices. No routine exercise. Has leg cramps Wearing orthopedic shoes.  Continues to have pain in feet. Has some peripheral edema Nocturia every hour - "my bladder is leaking" Has dysuria   Review of Systems A comprehensive review of systems was negative except for: Cardiovascular: positive for lower extremity edema Genitourinary: positive for dysuria and nocturia Musculoskeletal: positive for muscle weakness and muscle cramps    Objective:    BP 110/64   Pulse 68   Resp 14   Ht 5' 5"$  (1.651 m)   Wt 219 lb (99.3 kg)   SpO2 90%   BMI 36.44 kg/m   General:  alert, cooperative and no distress  Oropharynx: normal findings: lips normal without lesions and gums healthy   Eyes:  negative findings: lids and lashes normal and conjunctivae and sclerae normal   Ears:  external ears normal        Lung: clear to auscultation bilaterally  Heart:  regular rate and rhythm     Extremities: edema in bilateral lower extremities  Skin: dry     Neuro: mental status, speech normal, alert and oriented x3   Lab Review Glucose, Bld (mg/dL)  Date Value  12/14/2016 161 (H)  10/06/2016 206 (H)  09/28/2016 161 (H)   CO2 (mmol/L)  Date Value  12/14/2016 27  10/06/2016 28  09/28/2016 31   BUN (mg/dL)  Date Value  12/14/2016 36 (H)  10/06/2016 46 (H)  09/28/2016 26 (H)  02/26/2016 27  01/07/2016 21  10/15/2015 27   Creat (mg/dL)  Date Value  12/14/2016 2.64 (H)  10/06/2016 2.78 (H)  09/14/2016 2.80 (H)   Creatinine, Ser (mg/dL)  Date Value   09/28/2016 2.48 (H)  09/27/2016 2.29 (H)  09/26/2016 2.45 (H)       Assessment:    Diabetes Mellitus type II, under good control. last A1C was good.  Will recheck today. BP at goal <130/80 CKD - has visit with nephrologist next week.   Plan:    1.  Rx changes: none  2.  Continue Novolin 70/30 - 40 units every morning, and 25 units every evening. 3.  Counseled on nutrition goals 4.  Counseled on need for routine exercise.  Goal is 30-45 minutes 5 x week. 5.  Dysuria - will check U/A today. 6.  BP at goal <130/80 7.  Will recheck kidney function today.

## 2017-03-15 NOTE — Patient Instructions (Signed)
Keep in insulin dose the same.

## 2017-03-16 LAB — HEMOGLOBIN A1C
Hgb A1c MFr Bld: 6.8 % — ABNORMAL HIGH (ref <5.7)
Mean Plasma Glucose: 148 mg/dL

## 2017-03-18 ENCOUNTER — Other Ambulatory Visit: Payer: Self-pay | Admitting: Internal Medicine

## 2017-03-22 DIAGNOSIS — J449 Chronic obstructive pulmonary disease, unspecified: Secondary | ICD-10-CM | POA: Diagnosis not present

## 2017-03-23 ENCOUNTER — Encounter: Payer: Self-pay | Admitting: Pharmacotherapy

## 2017-03-25 ENCOUNTER — Encounter: Payer: Self-pay | Admitting: Internal Medicine

## 2017-03-25 DIAGNOSIS — R269 Unspecified abnormalities of gait and mobility: Secondary | ICD-10-CM | POA: Insufficient documentation

## 2017-03-25 DIAGNOSIS — M1990 Unspecified osteoarthritis, unspecified site: Secondary | ICD-10-CM | POA: Insufficient documentation

## 2017-03-25 DIAGNOSIS — M25519 Pain in unspecified shoulder: Secondary | ICD-10-CM | POA: Insufficient documentation

## 2017-03-29 DIAGNOSIS — N140 Analgesic nephropathy: Secondary | ICD-10-CM | POA: Diagnosis not present

## 2017-03-29 DIAGNOSIS — N184 Chronic kidney disease, stage 4 (severe): Secondary | ICD-10-CM | POA: Diagnosis not present

## 2017-03-29 DIAGNOSIS — M549 Dorsalgia, unspecified: Secondary | ICD-10-CM | POA: Diagnosis not present

## 2017-03-29 DIAGNOSIS — I129 Hypertensive chronic kidney disease with stage 1 through stage 4 chronic kidney disease, or unspecified chronic kidney disease: Secondary | ICD-10-CM | POA: Diagnosis not present

## 2017-03-30 ENCOUNTER — Ambulatory Visit (INDEPENDENT_AMBULATORY_CARE_PROVIDER_SITE_OTHER): Payer: Medicare HMO | Admitting: Nurse Practitioner

## 2017-03-30 ENCOUNTER — Encounter: Payer: Self-pay | Admitting: Nurse Practitioner

## 2017-03-30 VITALS — BP 118/82 | HR 60 | Temp 98.0°F | Resp 19 | Ht 65.0 in | Wt 221.4 lb

## 2017-03-30 DIAGNOSIS — G8929 Other chronic pain: Secondary | ICD-10-CM | POA: Diagnosis not present

## 2017-03-30 DIAGNOSIS — M545 Low back pain: Secondary | ICD-10-CM | POA: Diagnosis not present

## 2017-03-30 DIAGNOSIS — R300 Dysuria: Secondary | ICD-10-CM | POA: Diagnosis not present

## 2017-03-30 DIAGNOSIS — N907 Vulvar cyst: Secondary | ICD-10-CM

## 2017-03-30 MED ORDER — HYDROCODONE-ACETAMINOPHEN 10-325 MG PO TABS: 1.0000 | ORAL_TABLET | Freq: Four times a day (QID) | ORAL | 0 refills | Status: DC | PRN

## 2017-03-30 NOTE — Patient Instructions (Signed)
We will send off urine to make sure you do not have infection  May use lubricant to vaginal area to help with being dry

## 2017-03-30 NOTE — Progress Notes (Signed)
Careteam: Patient Care Team: [name], NP as PCP - General (Nurse Practitioner) [name], MD as Consulting Physician (Ophthalmology) [name], MD as Consulting Physician (Nephrology)  Advanced Directive information Does Patient Have a Medical Advance Directive?: No  Allergies  Allergen Reactions  . Tramadol Other (See Comments)    Leg cramps   . Codeine Nausea And Vomiting    Patient states N/V with codeine  . Hydrocodone Nausea And Vomiting  . Penicillins Rash    Patient states rash/itch with penicillin Has patient had a PCN reaction causing immediate rash, facial/tongue/throat swelling, SOB or lightheadedness with hypotension: Yes- broke me out and i was itching Has patient had a PCN reaction causing severe rash involving mucus membranes or skin necrosis: No Has patient had a PCN reaction that required hospitalization Yes- i was already in the hospital Has patient had a PCN reaction occurring within the last 10 years: No If all of the above answers are "NO  . Tylenol [Acetaminophen] Hives    Chief Complaint  Patient presents with  . Acute Visit    Pt is being seen for urinary leakage and buring. Pt also states that she has knots in vaginal area that bleed at times.      HPI: Patient is a 81 y.o. female seen in the office today due to burning with urination and small growths in her vaginal area.  Reports area is dry, using Vaseline which helps Reports she saw Dr Eulas Post in the past for this but not sure what she said they were.  She was treated for BV in Aug of 2017 and reports this helped.  No fevers or chills.  Pain when she urinates and wipes.  No discharge.    Review of Systems:  Review of Systems  Constitutional: Negative for activity change, fatigue and fever.  Cardiovascular: Negative for chest pain.  Gastrointestinal: Positive for abdominal pain (lower abdomen). Negative for abdominal distention.  Genitourinary: Positive for dysuria  and vaginal pain. Negative for difficulty urinating, frequency, hematuria and pelvic pain.    Past Medical History:  Diagnosis Date  . Acute bronchitis   . Anxiety   . Arthritis   . Chronic kidney disease, stage II (mild)   . Complications affecting other specified body systems, hypertension   . Congestive heart failure, unspecified    stress test in March at [facility]  . COPD (chronic obstructive pulmonary disease) ([place])    present for several years, diagnosed in the last few years  . Degenerative arthritis of knee, bilateral   . Diabetes mellitus   . Diaphragmatic hernia without mention of obstruction or gangrene   . Disorder of bone and cartilage, unspecified   . GERD (gastroesophageal reflux disease)   . Hiatal hernia   . Hyperlipidemia   . Hypertension   . Hypopotassemia   . Insomnia, unspecified   . Lumbago   . Obese   . Osteoarthrosis, unspecified whether generalized or localized, unspecified site   . PONV (postoperative nausea and vomiting)   . Scoliosis   . Secondary diabetes mellitus with renal manifestations, not stated as uncontrolled, or unspecified(249.40)   . Unspecified glaucoma(365.9)   . Unspecified hereditary and idiopathic peripheral neuropathy    Past Surgical History:  Procedure Laterality Date  . ABDOMINAL HYSTERECTOMY    . COLONOSCOPY  08/06/2011  . FOOT SURGERY    . KNEE ARTHROSCOPY     bilateral  . SHOULDER SURGERY    . TOTAL HIP ARTHROPLASTY  bilateral   Social History:   reports that she quit smoking about 20 years ago. Her smoking use included Cigarettes. She has a 1.50 pack-year smoking history. She has never used smokeless tobacco. She reports that she does not drink alcohol or use drugs.  Family History  Problem Relation Age of Onset  . Diabetes Mother   . Heart disease Mother   . Heart disease Brother   . Diabetes Brother   . Kidney disease Father   . Scoliosis Sister   . Diabetes Brother   . Diabetes Brother   .  Heart disease Brother   . Scoliosis Brother   . Stroke Brother   . Heart attack Brother     Medications: Patient's Medications  New Prescriptions   No medications on file  Previous Medications   ALBUTEROL (PROVENTIL HFA;VENTOLIN HFA) 108 (90 BASE) MCG/ACT INHALER    Inhale 2 puffs into the lungs every 4 (four) hours as needed for wheezing or shortness of breath.   AMITIZA 24 MCG CAPSULE    TAKE 1 CAPSULE BY MOUTH TWICE DAILY WITH MEALS   ASPIRIN EC 81 MG TABLET    Take 81 mg by mouth daily.   BUDESONIDE (PULMICORT) 0.25 MG/2ML NEBULIZER SOLUTION    Take one ampule in Neb twice daily routinely for COPD  J44.1   CHOLECALCIFEROL (VITAMIN D3) 2000 UNITS TABS    Take 2,000 Units by mouth daily.   CILOSTAZOL (PLETAL) 100 MG TABLET    TAKE 1 TABLET BY MOUTH EVERY DAY.   CYCLOSPORINE (RESTASIS) 0.05 % OPHTHALMIC EMULSION    Place 1 drop into both eyes 2 (two) times daily. One drop once a day bilaterally for dry eyes   DICLOFENAC SODIUM (VOLTAREN) 1 % GEL    APPLY 4 GRAM TOPICALLY TWICE DAILY as needed for pain   DULOXETINE (CYMBALTA) 30 MG CAPSULE    TAKE 1 CAPSULE BY MOUTH EVERY DAY TO HELP ANXIETY AND PAINS.   EASY TOUCH INSULIN SYRINGE 31G X 5/16" 0.5 ML MISC    USE UP TO 2 TIMES A DAY AS DIRECTED.   ESOMEPRAZOLE (NEXIUM) 40 MG CAPSULE    TAKE 1 CAPSULE BY MOUTH ONCE DAILY.   FUROSEMIDE (LASIX) 40 MG TABLET    TAKE 1 TABLET BY MOUTH TWICE A DAY   FUROSEMIDE (LASIX) 40 MG TABLET    TAKE 1 TABLET BY MOUTH TWICE A DAY   GABAPENTIN (NEURONTIN) 100 MG CAPSULE    TAKE 2 CAPSULES BY MOUTH THREE TIMES A DAY   GUAIFENESIN (ROBITUSSIN) 100 MG/5ML SYRUP    Take 200 mg by mouth 3 (three) times daily as needed for cough.   HYDRALAZINE (APRESOLINE) 50 MG TABLET    Take 1 tablet (50 mg total) by mouth 3 (three) times daily.   HYDROCODONE-ACETAMINOPHEN (NORCO) 10-325 MG TABLET    Take 1 tablet by mouth every 6 (six) hours as needed.   INSULIN ASPART PROTAMINE- ASPART (NOVOLOG MIX 70/30) (70-30) 100 UNIT/ML  INJECTION    Inject 40 units subcutaneously with breakfast and inject 25 units subcutaneously with dinner to control blood sugar   IPRATROPIUM-ALBUTEROL (DUONEB) 0.5-2.5 (3) MG/3ML SOLN    Take 28mls by nebulization every 6 hours as needed for SOB/Wheezing   LINACLOTIDE (LINZESS) 145 MCG CAPS CAPSULE    Take 145 mcg by mouth 2 (two) times daily.   METOPROLOL SUCCINATE (TOPROL-XL) 25 MG 24 HR TABLET    TAKE 1 TABLET BY MOUTH EVERY DAY WITH OR IMMEDIATELY FOLLOWING A MEAL FOR BLOOD  PRESSURE.   POTASSIUM CHLORIDE SA (K-DUR,KLOR-CON) 20 MEQ TABLET    Take 1 tablet (20 mEq total) by mouth daily.   SERTRALINE (ZOLOFT) 50 MG TABLET    Take 1 tablet (50 mg total) by mouth daily.   SIMVASTATIN (ZOCOR) 20 MG TABLET    TAKE 1 TABLET BY MOUTH EVERY DAY.   SUCRALFATE (CARAFATE) 1 G TABLET    TAKE 1 TABLET BY MOUTH TWICE DAILY ON EMPTY STOMACH.   TIMOLOL (TIMOPTIC) 0.5 % OPHTHALMIC SOLUTION    INSTILL 1 DROP INTO EACH EYE TWICE A DAY   TRAVATAN Z 0.004 % SOLN OPHTHALMIC SOLUTION    Place 1 drop into both eyes at bedtime.   Modified Medications   No medications on file  Discontinued Medications   No medications on file     Physical Exam:  Vitals:   03/30/17 1257  BP: 118/82  Pulse: 60  Resp: 19  Temp: 98 F (36.7 C)  TempSrc: Oral  SpO2: 93%  Weight: 221 lb 6.4 oz (100.4 kg)  Height: 5\' 5"  (1.651 m)   Body mass index is 36.84 kg/m.  Physical Exam  Constitutional: She is oriented to person, place, and time. She appears well-developed and well-nourished.  HENT:  Mouth/Throat: Oropharynx is clear and moist. No oropharyngeal exudate.  Eyes: Pupils are equal, round, and reactive to light.  Neck: Neck supple. Carotid bruit is not present.  Cardiovascular: Normal rate, regular rhythm and intact distal pulses.  Exam reveals no gallop and no friction rub.   Murmur (1/6 SEM) heard. Pulmonary/Chest: No respiratory distress. She has decreased breath sounds. She has no wheezes. She has no rales.    Diminished throughout  Abdominal: Soft. Bowel sounds are normal. There is no hepatomegaly. There is tenderness (suprapubic).  Genitourinary: No vaginal discharge found.  Genitourinary Comments: Few small scattered sebaceous cyst noted to bilateral labia  Musculoskeletal: She exhibits edema.  Uses rolling walker; trace edema  Neurological: She is alert and oriented to person, place, and time.  Skin: Skin is warm and dry.  Psychiatric: She has a normal mood and affect. Her behavior is normal. Thought content normal.    Labs reviewed: Basic Metabolic Panel:  Recent Labs  10/06/16 1428 12/14/16 0949 03/15/17 1015  NA 135 139 142  K 4.1 3.8 3.5  CL 96* 100 101  CO2 28 27 31   GLUCOSE 206* 161* 102*  BUN 46* 36* 25  CREATININE 2.78* 2.64* 2.22*  CALCIUM 9.1 9.4 9.2   Liver Function Tests:  Recent Labs  09/14/16 1108 12/14/16 0949 03/15/17 1015  AST 13 18 19   ALT 6 8 10   ALKPHOS 95 90 83  BILITOT 0.3 0.3 0.2  PROT 6.8 7.0 6.7  ALBUMIN 3.7 3.9 3.6   No results for input(s): LIPASE, AMYLASE in the last 8760 hours. No results for input(s): AMMONIA in the last 8760 hours. CBC:  Recent Labs  09/14/16 1108 09/24/16 1959 09/25/16 0359 10/06/16 1428  WBC 7.0 9.0 6.7 9.7  NEUTROABS 3,780  --  3.4 6,208  HGB 9.9* 10.8* 10.2* 10.3*  HCT 31.5* 34.3* 31.9* 32.3*  MCV 81.4 81.1 80.8 81.6  PLT 272 282 234 252   Lipid Panel: No results for input(s): CHOL, HDL, LDLCALC, TRIG, CHOLHDL, LDLDIRECT in the last 8760 hours. TSH: No results for input(s): TSH in the last 8760 hours. A1C: Lab Results  Component Value Date   HGBA1C 6.8 (H) 03/15/2017     Assessment/Plan 1. Sebaceous cyst of labia Small sebaceous cyst  noted to labia bilaterally. No drainage or heat noted.   2. Dysuria Will get UA C&S  Proper hydration To use lubricant to help with dryness   3. Chronic midline low back pain without sciatica Refill provided today - HYDROcodone-acetaminophen (NORCO) 10-325  MG tablet; Take 1 tablet by mouth every 6 (six) hours as needed.  Dispense: 120 tablet; Refill: 0  Nabria Nevin K. Harle Battiest  [facility] [phone] 8 am - 5 pm) [phone] (after hours)

## 2017-03-31 LAB — URINALYSIS
BILIRUBIN URINE: NEGATIVE
GLUCOSE, UA: NEGATIVE
Hgb urine dipstick: NEGATIVE
Ketones, ur: NEGATIVE
Leukocytes, UA: NEGATIVE
Nitrite: NEGATIVE
Protein, ur: NEGATIVE
SPECIFIC GRAVITY, URINE: 1.006 (ref 1.001–1.035)
pH: 6 (ref 5.0–8.0)

## 2017-03-31 LAB — URINE CULTURE: ORGANISM ID, BACTERIA: NO GROWTH

## 2017-04-03 DIAGNOSIS — Z0100 Encounter for examination of eyes and vision without abnormal findings: Secondary | ICD-10-CM | POA: Diagnosis not present

## 2017-04-14 DIAGNOSIS — J449 Chronic obstructive pulmonary disease, unspecified: Secondary | ICD-10-CM | POA: Diagnosis not present

## 2017-04-16 ENCOUNTER — Telehealth: Payer: Self-pay | Admitting: *Deleted

## 2017-04-16 MED ORDER — "INSULIN SYRINGE-NEEDLE U-100 31G X 5/16"" 0.5 ML MISC": 3 refills | Status: DC

## 2017-04-16 MED ORDER — SUCRALFATE 1 G PO TABS: ORAL_TABLET | ORAL | 3 refills | Status: DC

## 2017-04-16 MED ORDER — SERTRALINE HCL 50 MG PO TABS: 50.0000 mg | ORAL_TABLET | Freq: Every day | ORAL | 3 refills | Status: DC

## 2017-04-16 MED ORDER — SIMVASTATIN 20 MG PO TABS: 20.0000 mg | ORAL_TABLET | Freq: Every day | ORAL | 3 refills | Status: DC

## 2017-04-16 MED ORDER — ESOMEPRAZOLE MAGNESIUM 40 MG PO CPDR: 40.0000 mg | DELAYED_RELEASE_CAPSULE | Freq: Every day | ORAL | 3 refills | Status: DC

## 2017-04-16 MED ORDER — DULOXETINE HCL 30 MG PO CPEP: ORAL_CAPSULE | ORAL | 3 refills | Status: DC

## 2017-04-16 MED ORDER — POTASSIUM CHLORIDE CRYS ER 20 MEQ PO TBCR: 20.0000 meq | EXTENDED_RELEASE_TABLET | Freq: Every day | ORAL | 3 refills | Status: DC

## 2017-04-16 MED ORDER — CILOSTAZOL 100 MG PO TABS: 100.0000 mg | ORAL_TABLET | Freq: Every day | ORAL | 3 refills | Status: DC

## 2017-04-16 MED ORDER — METOPROLOL SUCCINATE ER 25 MG PO TB24: ORAL_TABLET | ORAL | 3 refills | Status: DC

## 2017-04-16 MED ORDER — GABAPENTIN 100 MG PO CAPS: ORAL_CAPSULE | ORAL | 3 refills | Status: DC

## 2017-04-16 MED ORDER — HYDRALAZINE HCL 50 MG PO TABS: 50.0000 mg | ORAL_TABLET | Freq: Three times a day (TID) | ORAL | 3 refills | Status: DC

## 2017-04-16 MED ORDER — LUBIPROSTONE 24 MCG PO CAPS: ORAL_CAPSULE | ORAL | 3 refills | Status: DC
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
## 2017-04-20 ENCOUNTER — Other Ambulatory Visit: Payer: Self-pay | Admitting: *Deleted

## 2017-04-20 MED ORDER — INSULIN ASPART PROT & ASPART (70-30 MIX) 100 UNIT/ML ~~LOC~~ SUSP: SUBCUTANEOUS | 11 refills | Status: DC

## 2017-04-20 MED ORDER — LINACLOTIDE 145 MCG PO CAPS: 145.0000 ug | ORAL_CAPSULE | Freq: Two times a day (BID) | ORAL | 3 refills | Status: DC

## 2017-04-20 NOTE — Telephone Encounter (Signed)
Patient requested refill to be sen to [facility] [place]

## 2017-04-21 DIAGNOSIS — J449 Chronic obstructive pulmonary disease, unspecified: Secondary | ICD-10-CM | POA: Diagnosis not present

## 2017-04-28 ENCOUNTER — Other Ambulatory Visit: Payer: Self-pay | Admitting: *Deleted

## 2017-04-28 DIAGNOSIS — G8929 Other chronic pain: Secondary | ICD-10-CM

## 2017-04-28 DIAGNOSIS — M545 Low back pain, unspecified: Secondary | ICD-10-CM

## 2017-04-28 MED ORDER — HYDROCODONE-ACETAMINOPHEN 10-325 MG PO TABS: 1.0000 | ORAL_TABLET | Freq: Four times a day (QID) | ORAL | 0 refills | Status: DC | PRN

## 2017-04-28 NOTE — Telephone Encounter (Signed)
Patient aware RX signed and ready for pick-up

## 2017-04-28 NOTE — Telephone Encounter (Signed)
Patient requested and will pick up 

## 2017-04-29 DIAGNOSIS — E119 Type 2 diabetes mellitus without complications: Secondary | ICD-10-CM | POA: Diagnosis not present

## 2017-05-03 ENCOUNTER — Emergency Department (HOSPITAL_COMMUNITY)
Admission: EM | Admit: 2017-05-03 | Discharge: 2017-05-03 | Disposition: A | Discharge status: Home / Self Care | Payer: Medicare HMO | Attending: Emergency Medicine | Admitting: Emergency Medicine

## 2017-05-03 ENCOUNTER — Emergency Department (HOSPITAL_COMMUNITY): Payer: Medicare HMO

## 2017-05-03 ENCOUNTER — Encounter (HOSPITAL_COMMUNITY): Payer: Self-pay | Admitting: Emergency Medicine

## 2017-05-03 DIAGNOSIS — Z7982 Long term (current) use of aspirin: Secondary | ICD-10-CM | POA: Insufficient documentation

## 2017-05-03 DIAGNOSIS — Z87891 Personal history of nicotine dependence: Secondary | ICD-10-CM | POA: Diagnosis not present

## 2017-05-03 DIAGNOSIS — J449 Chronic obstructive pulmonary disease, unspecified: Secondary | ICD-10-CM | POA: Diagnosis not present

## 2017-05-03 DIAGNOSIS — Z794 Long term (current) use of insulin: Secondary | ICD-10-CM | POA: Insufficient documentation

## 2017-05-03 DIAGNOSIS — K449 Diaphragmatic hernia without obstruction or gangrene: Secondary | ICD-10-CM | POA: Diagnosis not present

## 2017-05-03 DIAGNOSIS — E1122 Type 2 diabetes mellitus with diabetic chronic kidney disease: Secondary | ICD-10-CM | POA: Diagnosis not present

## 2017-05-03 DIAGNOSIS — E1140 Type 2 diabetes mellitus with diabetic neuropathy, unspecified: Secondary | ICD-10-CM | POA: Insufficient documentation

## 2017-05-03 DIAGNOSIS — I5033 Acute on chronic diastolic (congestive) heart failure: Secondary | ICD-10-CM | POA: Diagnosis not present

## 2017-05-03 DIAGNOSIS — I517 Cardiomegaly: Secondary | ICD-10-CM | POA: Diagnosis not present

## 2017-05-03 DIAGNOSIS — I130 Hypertensive heart and chronic kidney disease with heart failure and stage 1 through stage 4 chronic kidney disease, or unspecified chronic kidney disease: Secondary | ICD-10-CM | POA: Insufficient documentation

## 2017-05-03 DIAGNOSIS — N202 Calculus of kidney with calculus of ureter: Secondary | ICD-10-CM | POA: Insufficient documentation

## 2017-05-03 DIAGNOSIS — N200 Calculus of kidney: Secondary | ICD-10-CM

## 2017-05-03 DIAGNOSIS — R1031 Right lower quadrant pain: Secondary | ICD-10-CM | POA: Diagnosis present

## 2017-05-03 DIAGNOSIS — N184 Chronic kidney disease, stage 4 (severe): Secondary | ICD-10-CM | POA: Diagnosis not present

## 2017-05-03 DIAGNOSIS — N23 Unspecified renal colic: Secondary | ICD-10-CM

## 2017-05-03 DIAGNOSIS — Z79899 Other long term (current) drug therapy: Secondary | ICD-10-CM | POA: Insufficient documentation

## 2017-05-03 DIAGNOSIS — R109 Unspecified abdominal pain: Secondary | ICD-10-CM | POA: Diagnosis not present

## 2017-05-03 DIAGNOSIS — Z96643 Presence of artificial hip joint, bilateral: Secondary | ICD-10-CM | POA: Diagnosis not present

## 2017-05-03 LAB — COMPREHENSIVE METABOLIC PANEL
ALK PHOS: 88 U/L (ref 38–126)
ALT: 8 U/L — ABNORMAL LOW (ref 14–54)
ANION GAP: 9 (ref 5–15)
AST: 17 U/L (ref 15–41)
Albumin: 3.5 g/dL (ref 3.5–5.0)
BILIRUBIN TOTAL: 0.3 mg/dL (ref 0.3–1.2)
BUN: 23 mg/dL — ABNORMAL HIGH (ref 6–20)
CO2: 27 mmol/L (ref 22–32)
Calcium: 9 mg/dL (ref 8.9–10.3)
Chloride: 104 mmol/L (ref 101–111)
Creatinine, Ser: 2.35 mg/dL — ABNORMAL HIGH (ref 0.44–1.00)
GFR, EST AFRICAN AMERICAN: 22 mL/min — AB (ref >60)
GFR, EST NON AFRICAN AMERICAN: 19 mL/min — AB (ref >60)
Glucose, Bld: 98 mg/dL (ref 65–99)
POTASSIUM: 3.4 mmol/L — AB (ref 3.5–5.1)
Sodium: 140 mmol/L (ref 135–145)
TOTAL PROTEIN: 7.3 g/dL (ref 6.5–8.1)

## 2017-05-03 LAB — URINALYSIS, ROUTINE W REFLEX MICROSCOPIC
BILIRUBIN URINE: NEGATIVE
Bacteria, UA: NONE SEEN
GLUCOSE, UA: NEGATIVE mg/dL
Ketones, ur: NEGATIVE mg/dL
LEUKOCYTES UA: NEGATIVE
NITRITE: NEGATIVE
PROTEIN: NEGATIVE mg/dL
Specific Gravity, Urine: 1.004 — ABNORMAL LOW (ref 1.005–1.030)
pH: 5 (ref 5.0–8.0)

## 2017-05-03 LAB — CBC
HCT: 32 % — ABNORMAL LOW (ref 36.0–46.0)
HEMOGLOBIN: 10.3 g/dL — AB (ref 12.0–15.0)
MCH: 25.5 pg — ABNORMAL LOW (ref 26.0–34.0)
MCHC: 32.2 g/dL (ref 30.0–36.0)
MCV: 79.2 fL (ref 78.0–100.0)
Platelets: 286 10*3/uL (ref 150–400)
RBC: 4.04 MIL/uL (ref 3.87–5.11)
RDW: 15.7 % — ABNORMAL HIGH (ref 11.5–15.5)
WBC: 7.5 10*3/uL (ref 4.0–10.5)

## 2017-05-03 LAB — CBG MONITORING, ED
Glucose-Capillary: 46 mg/dL — ABNORMAL LOW (ref 65–99)
Glucose-Capillary: 104 mg/dL — ABNORMAL HIGH (ref 65–99)

## 2017-05-03 LAB — LIPASE, BLOOD: Lipase: 19 U/L (ref 11–51)

## 2017-05-03 MED ORDER — HYDROMORPHONE HCL 1 MG/ML IJ SOLN
0.5000 mg | Freq: Once | INTRAMUSCULAR | Status: AC
  Administered 2017-05-03: 0.5 mg via INTRAVENOUS
  Filled 2017-05-03: qty 1

## 2017-05-03 MED ORDER — ALBUTEROL SULFATE (2.5 MG/3ML) 0.083% IN NEBU
5.0000 mg | INHALATION_SOLUTION | Freq: Once | RESPIRATORY_TRACT | Status: AC
  Administered 2017-05-03: 5 mg via RESPIRATORY_TRACT
  Filled 2017-05-03: qty 6

## 2017-05-03 MED ORDER — OXYCODONE-ACETAMINOPHEN 5-325 MG PO TABS: 1.0000 | ORAL_TABLET | Freq: Four times a day (QID) | ORAL | 0 refills | Status: DC | PRN

## 2017-05-03 MED ORDER — DEXTROSE 50 % IV SOLN: 25.0000 g | Freq: Once | INTRAVENOUS | Status: DC

## 2017-05-03 MED ORDER — ONDANSETRON HCL 4 MG/2ML IJ SOLN
4.0000 mg | Freq: Once | INTRAMUSCULAR | Status: AC
  Administered 2017-05-03: 4 mg via INTRAVENOUS
  Filled 2017-05-03: qty 2

## 2017-05-03 MED ORDER — POTASSIUM CHLORIDE CRYS ER 20 MEQ PO TBCR
20.0000 meq | EXTENDED_RELEASE_TABLET | Freq: Once | ORAL | Status: AC
  Administered 2017-05-03: 20 meq via ORAL
  Filled 2017-05-03: qty 1

## 2017-05-03 MED ORDER — MORPHINE SULFATE (PF) 2 MG/ML IV SOLN
4.0000 mg | Freq: Once | INTRAVENOUS | Status: AC
  Administered 2017-05-03: 4 mg via INTRAVENOUS
  Filled 2017-05-03: qty 2

## 2017-05-03 MED ORDER — SODIUM CHLORIDE 0.9 % IV BOLUS (SEPSIS)
500.0000 mL | Freq: Once | INTRAVENOUS | Status: AC
  Administered 2017-05-03: 500 mL via INTRAVENOUS

## 2017-05-03 MED ORDER — TAMSULOSIN HCL 0.4 MG PO CAPS: 0.4000 mg | ORAL_CAPSULE | Freq: Every day | ORAL | 0 refills | Status: DC

## 2017-05-03 MED ORDER — IPRATROPIUM BROMIDE 0.02 % IN SOLN
0.5000 mg | Freq: Once | RESPIRATORY_TRACT | Status: AC
  Administered 2017-05-03: 0.5 mg via RESPIRATORY_TRACT
  Filled 2017-05-03: qty 2.5

## 2017-05-03 MED ORDER — ONDANSETRON 8 MG PO TBDP: 8.0000 mg | ORAL_TABLET | Freq: Three times a day (TID) | ORAL | 0 refills | Status: DC | PRN

## 2017-05-03 MED ORDER — DEXTROSE 50 % IV SOLN: 25.0000 mL | Freq: Once | INTRAVENOUS | Status: DC

## 2017-05-03 NOTE — ED Triage Notes (Signed)
Pt complaint of right abdominal pain with associated nausea, watery stool, and dribbles of urine onset 0400.

## 2017-05-03 NOTE — ED Notes (Signed)
Pt knows we want UA to test.  She recently used the restroom before coming back. Will check back in with her.

## 2017-05-03 NOTE — ED Provider Notes (Signed)
[facility] DEPT Provider Note   CSN: [id number] Arrival date & time: 05/03/17  1148     History   Chief Complaint Chief Complaint  Patient presents with  . Abdominal Pain    HPI Michelle Buck is a 81 y.o. female.  Patient c/o RLQ abdominal pain for the past day. Pain constant, dull, moderate, worse w palpation, non radiating. Denies hx same pain. Nausea. No vomiting or diarrhea. No constipation. +dysuria. No vaginal discharge or bleeding. Also c/o non prod cough in past few days. Use home o2 2 l continuously.  Denies sore throat or runny nose. No chest pain. Denies fever or chills.    The history is provided by the patient.  Abdominal Pain   Associated symptoms include dysuria. Pertinent negatives include fever, diarrhea, vomiting, constipation and headaches.    Past Medical History:  Diagnosis Date  . Acute bronchitis   . Anxiety   . Arthritis   . Chronic kidney disease, stage II (mild)   . Complications affecting other specified body systems, hypertension   . Congestive heart failure, unspecified    stress test in March at [facility]  . COPD (chronic obstructive pulmonary disease) ([place])    present for several years, diagnosed in the last few years  . Degenerative arthritis of knee, bilateral   . Diabetes mellitus   . Diaphragmatic hernia without mention of obstruction or gangrene   . Disorder of bone and cartilage, unspecified   . GERD (gastroesophageal reflux disease)   . Hiatal hernia   . Hyperlipidemia   . Hypertension   . Hypopotassemia   . Insomnia, unspecified   . Lumbago   . Obese   . Osteoarthrosis, unspecified whether generalized or localized, unspecified site   . PONV (postoperative nausea and vomiting)   . Scoliosis   . Secondary diabetes mellitus with renal manifestations, not stated as uncontrolled, or unspecified(249.40)   . Unspecified glaucoma(365.9)   . Unspecified hereditary and idiopathic peripheral neuropathy     Patient Active  Problem List   Diagnosis Date Noted  . Osteoarthritis 03/25/2017  . Shoulder pain 03/25/2017  . Abnormality of gait 03/25/2017  . COPD (chronic obstructive pulmonary disease) ([place]) 09/25/2016  . Diastolic CHF ([place]) 83/38/2505  . Chest pressure 09/25/2016  . Dyspnea and respiratory abnormality 09/23/2016  . OSA (obstructive sleep apnea) 09/02/2016  . Other fatigue 06/23/2016  . COPD exacerbation ([place]) 05/21/2016  . PAD (peripheral artery disease) ([place]) 05/21/2016  . Glaucoma 05/21/2016  . Bilateral edema of lower extremity   . Leg edema 01/01/2016  . CKD stage 3 due to type 2 diabetes mellitus ([place])   . Healthcare-associated pneumonia   . CHF (congestive heart failure) ([place]) 10/29/2015  . Chronic obstructive pulmonary disease ([place]) 10/29/2015  . Lumbago   . CKD (chronic kidney disease)   . Cellulitis 09/11/2015  . Hypomagnesemia 09/11/2015  . Chronic kidney disease (CKD), stage IV (severe) ([place]) 09/11/2015  . Diabetes mellitus with diabetic nephropathy, with long-term current use of insulin ([place]) 09/11/2015  . Anemia of chronic kidney failure 09/11/2015  . COPD with acute exacerbation ([place]) 09/11/2015  . Morbid obesity ([place]) 08/14/2015  . Peripheral autonomic neuropathy due to diabetes mellitus ([place]) 10/24/2013  . Essential hypertension, benign 02/27/2013  . Acute on chronic diastolic congestive heart failure ([place]) 03/02/2012  . Diabetes mellitus type 2 in obese ([place]) 03/02/2012    Past Surgical History:  Procedure Laterality Date  . ABDOMINAL HYSTERECTOMY    . COLONOSCOPY  08/06/2011  . FOOT SURGERY    .  KNEE ARTHROSCOPY     bilateral  . SHOULDER SURGERY    . TOTAL HIP ARTHROPLASTY     bilateral    OB History    No data available       Home Medications    Prior to Admission medications   Medication Sig Start Date End Date Taking? Authorizing Provider  albuterol (PROVENTIL HFA;VENTOLIN HFA) 108 (90 Base) MCG/ACT inhaler Inhale 2 puffs into the lungs every 4  (four) hours as needed for wheezing or shortness of breath.    [provider]  aspirin EC 81 MG tablet Take 81 mg by mouth daily.    [provider]  budesonide (PULMICORT) 0.25 MG/2ML nebulizer solution Take one ampule in Neb twice daily routinely for COPD  J44.1 11/24/16   [name], NP  Cholecalciferol (VITAMIN D3) 2000 units TABS Take 2,000 Units by mouth daily.    [provider]  cilostazol (PLETAL) 100 MG tablet Take 1 tablet (100 mg total) by mouth daily. 04/16/17   [name], NP  cycloSPORINE (RESTASIS) 0.05 % ophthalmic emulsion Place 1 drop into both eyes 2 (two) times daily. One drop once a day bilaterally for dry eyes    [provider]  diclofenac sodium (VOLTAREN) 1 % GEL APPLY 4 GRAM TOPICALLY TWICE DAILY as needed for pain 06/08/16   [name], NP  DULoxetine (CYMBALTA) 30 MG capsule Take one capsule by mouth once daily to help anxiety and pains 04/16/17   [name], NP  esomeprazole (NEXIUM) 40 MG capsule Take 1 capsule (40 mg total) by mouth daily. 04/16/17   [name], NP  furosemide (LASIX) 40 MG tablet TAKE 1 TABLET BY MOUTH TWICE A DAY 02/08/17   [name], NP  gabapentin (NEURONTIN) 100 MG capsule Take two capsules by mouth three times daily 04/16/17   [name], NP  guaifenesin (ROBITUSSIN) 100 MG/5ML syrup Take 200 mg by mouth 3 (three) times daily as needed for cough.    [provider]  hydrALAZINE (APRESOLINE) 50 MG tablet Take 1 tablet (50 mg total) by mouth 3 (three) times daily. 04/16/17   [name], NP  HYDROcodone-acetaminophen (NORCO) 10-325 MG tablet Take 1 tablet by mouth every 6 (six) hours as needed. 04/28/17   Gildardo Cranker, DO  insulin aspart protamine- aspart (NOVOLOG MIX 70/30) (70-30) 100 UNIT/ML injection Inject 40 units subcutaneously with breakfast and inject 25 units subcutaneously with dinner to control blood sugar 04/20/17   [name],  NP  Insulin Syringe-Needle U-100 (EASY TOUCH INSULIN SYRINGE) 31G X 5/16" 0.5 ML MISC USE UP TO 2 TIMES A DAY AS DIRECTED. 04/16/17   [name], NP  ipratropium-albuterol (DUONEB) 0.5-2.5 (3) MG/3ML SOLN Take 53mls by nebulization every 6 hours as needed for SOB/Wheezing 01/09/16   [name], NP  linaclotide (LINZESS) 145 MCG CAPS capsule Take 1 capsule (145 mcg total) by mouth 2 (two) times daily. 04/20/17   [name], NP  lubiprostone (AMITIZA) 24 MCG capsule Take one capsule by mouth twice daily with meals 04/16/17   [name], NP  metoprolol succinate (TOPROL-XL) 25 MG 24 hr tablet Take one tablet by mouth once daily with or immediately following a meal for blood pressure 04/16/17   [name], NP  potassium chloride SA (K-DUR,KLOR-CON) 20 MEQ tablet Take 1 tablet (20 mEq total) by mouth daily. 04/16/17   [name], NP  sertraline (ZOLOFT) 50 MG tablet  Take 1 tablet (50 mg total) by mouth daily. 04/16/17   [name], NP  simvastatin (ZOCOR) 20 MG tablet Take 1 tablet (20 mg total) by mouth daily. 04/16/17   [name], NP  sucralfate (CARAFATE) 1 g tablet Take one tablet by mouth twice daily on empty stomach 04/16/17   [name], NP  timolol (TIMOPTIC) 0.5 % ophthalmic solution INSTILL 1 DROP INTO EACH EYE TWICE A DAY 02/12/16   [name], NP  TRAVATAN Z 0.004 % SOLN ophthalmic solution Place 1 drop into both eyes at bedtime.  04/09/16   [provider]    Family History Family History  Problem Relation Age of Onset  . Diabetes Mother   . Heart disease Mother   . Heart disease Brother   . Diabetes Brother   . Kidney disease Father   . Scoliosis Sister   . Diabetes Brother   . Diabetes Brother   . Heart disease Brother   . Scoliosis Brother   . Stroke Brother   . Heart attack Brother     Social History Social History  Substance Use Topics  . Smoking status: Former Smoker    Packs/day: 0.50     Years: 3.00    Types: Cigarettes    Quit date: 02/27/1997  . Smokeless tobacco: Never Used     Comment: remote history - stopped 19 years ago; 20 pack year history  . Alcohol use No     Allergies   Tramadol; Codeine; Hydrocodone; Penicillins; and Tylenol [acetaminophen]   Review of Systems Review of Systems  Constitutional: Negative for chills and fever.  HENT: Negative for sore throat.   Eyes: Negative for redness.  Respiratory: Positive for cough. Negative for shortness of breath.   Cardiovascular: Negative for chest pain.  Gastrointestinal: Positive for abdominal pain. Negative for constipation, diarrhea and vomiting.  Genitourinary: Positive for dysuria. Negative for flank pain.  Musculoskeletal: Negative for back pain and neck pain.  Skin: Negative for rash.  Neurological: Negative for headaches.  Hematological: Does not bruise/bleed easily.  Psychiatric/Behavioral: Negative for confusion.     Physical Exam Updated Vital Signs BP (!) 141/68 (BP Location: Left Arm)   Pulse (!) 59   Temp 98.4 F (36.9 C) (Oral)   Resp 16   SpO2 100%   Physical Exam  Constitutional: She appears well-developed and well-nourished. No distress.  HENT:  Mouth/Throat: Oropharynx is clear and moist.  Eyes: Conjunctivae are normal. No scleral icterus.  Neck: Neck supple. No tracheal deviation present.  Cardiovascular: Normal rate, regular rhythm, normal heart sounds and intact distal pulses.  Exam reveals no gallop and no friction rub.   No murmur heard. Pulmonary/Chest: Effort normal. No respiratory distress. She has wheezes.  Abdominal: Soft. Normal appearance and bowel sounds are normal. She exhibits no distension and no mass. There is tenderness. There is no rebound and no guarding. No hernia.  Moderate RLQ tenderness.   Genitourinary:  Genitourinary Comments: No cva tenderness.   Musculoskeletal: She exhibits no edema.  Neurological: She is alert.  Skin: Skin is warm and dry. No  rash noted.  Psychiatric: She has a normal mood and affect.  Nursing note and vitals reviewed.    ED Treatments / Results  Labs (all labs ordered are listed, but only abnormal results are displayed) Results for orders placed or performed during the hospital encounter of 05/03/17  Lipase, blood  Result Value Ref Range   Lipase 19 11 - 51 U/L  Comprehensive metabolic panel  Result Value Ref Range   Sodium 140 135 - 145 mmol/L   Potassium 3.4 (L) 3.5 - 5.1 mmol/L   Chloride 104 101 - 111 mmol/L   CO2 27 22 - 32 mmol/L   Glucose, Bld 98 65 - 99 mg/dL   BUN 23 (H) 6 - 20 mg/dL   Creatinine, Ser 2.35 (H) 0.44 - 1.00 mg/dL   Calcium 9.0 8.9 - 10.3 mg/dL   Total Protein 7.3 6.5 - 8.1 g/dL   Albumin 3.5 3.5 - 5.0 g/dL   AST 17 15 - 41 U/L   ALT 8 (L) 14 - 54 U/L   Alkaline Phosphatase 88 38 - 126 U/L   Total Bilirubin 0.3 0.3 - 1.2 mg/dL   GFR calc non Af Amer 19 (L) >60 mL/min   GFR calc Af Amer 22 (L) >60 mL/min   Anion gap 9 5 - 15  CBC  Result Value Ref Range   WBC 7.5 4.0 - 10.5 K/uL   RBC 4.04 3.87 - 5.11 MIL/uL   Hemoglobin 10.3 (L) 12.0 - 15.0 g/dL   HCT 32.0 (L) 36.0 - 46.0 %   MCV 79.2 78.0 - 100.0 fL   MCH 25.5 (L) 26.0 - 34.0 pg   MCHC 32.2 30.0 - 36.0 g/dL   RDW 15.7 (H) 11.5 - 15.5 %   Platelets 286 150 - 400 K/uL  Urinalysis, Routine w reflex microscopic  Result Value Ref Range   Color, Urine STRAW (A) YELLOW   APPearance CLEAR CLEAR   Specific Gravity, Urine 1.004 (L) 1.005 - 1.030   pH 5.0 5.0 - 8.0   Glucose, UA NEGATIVE NEGATIVE mg/dL   Hgb urine dipstick SMALL (A) NEGATIVE   Bilirubin Urine NEGATIVE NEGATIVE   Ketones, ur NEGATIVE NEGATIVE mg/dL   Protein, ur NEGATIVE NEGATIVE mg/dL   Nitrite NEGATIVE NEGATIVE   Leukocytes, UA NEGATIVE NEGATIVE   RBC / HPF 6-30 0 - 5 RBC/hpf   WBC, UA 0-5 0 - 5 WBC/hpf   Bacteria, UA NONE SEEN NONE SEEN   Squamous Epithelial / LPF 0-5 (A) NONE SEEN   Mucous PRESENT   CBG monitoring, ED  Result Value Ref Range     Glucose-Capillary 46 (L) 65 - 99 mg/dL  CBG monitoring, ED  Result Value Ref Range   Glucose-Capillary 104 (H) 65 - 99 mg/dL   Dg Chest Port 1 View  Result Date: 05/03/2017 CLINICAL DATA:  Nausea and diarrhea.  Abdominal pain EXAM: PORTABLE CHEST 1 VIEW COMPARISON:  October 07, 2016 FINDINGS: No edema or consolidation. Heart is mildly enlarged with pulmonary vascular within normal limits. No adenopathy. There is aortic atherosclerosis. There is extensive arthropathy in both shoulders, more severe on the left than on the right. IMPRESSION: Cardiomegaly.  Aortic atherosclerosis.  No edema or consolidation. Electronically Signed   By: Lowella Grip III M.D.   On: 05/03/2017 13:51    EKG  EKG Interpretation None       Radiology Ct Abdomen Pelvis Wo Contrast  Result Date: 05/03/2017 CLINICAL DATA:  Abdominal pain with associated nausea and watery stool. EXAM: CT ABDOMEN AND PELVIS WITHOUT CONTRAST TECHNIQUE: Multidetector CT imaging of the abdomen and pelvis was performed following the standard protocol without IV contrast. COMPARISON:  CT 09/01/2011 FINDINGS: Lower chest: Mild bibasilar linear pleural-parenchymal thickening. Hepatobiliary: No focal hepatic lesion on noncontrast exam. Normal gallbladder. Pancreas: Pancreas is normal. No ductal dilatation. No pancreatic inflammation. Spleen: Enlargement of the LEFT adrenal is low-attenuation consists  with benign adenoma. RIGHT adrenal glands normal. 2 large calculus within the RIGHT kidney measuring 17 mm and 6 mm. These calculi are nonobstructive. No perinephric stranding and mild hydroureter of the RIGHT. Potential obstructing calculus in the distal RIGHT ureter measures 6 mm (image 72, series 2). Difficult to confidently localized this calculus within the ureter as there is streak artifact from the bilateral hip prosthetics. Tiny nonobstructing calculus in the LEFT kidney measures 4 mm. No obstructive uropathy on the LEFT. No ureteral calculi  identified on the LEFT. Adrenals/urinary tract: Adrenal glands and kidneys are normal. The ureters and bladder normal. Stomach/Bowel: Large hiatal hernia. Duodenum small bowel are normal. Terminal ileum is normal. Appendix is not identified. No secondary signs of appendicitis. Multiple diverticula through the sigmoid region with no acute diverticulitis. Vascular/Lymphatic: Abdominal aorta is normal caliber with atherosclerotic calcification. There is no retroperitoneal or periportal lymphadenopathy. No pelvic lymphadenopathy. Reproductive: Post hysterectomy. Pelvic floor poorly evaluated due to streak artifact from hip prosthetics. Other: No free fluid. Musculoskeletal: No aggressive osseous lesion. IMPRESSION: 1. Perinephric stranding and mild hydroureter on the RIGHT with potential distal ureteral calculus on the RIGHT. Difficult to define the location due to streak artifact from the hip prosthetics. IV contrast CT with delays made further localize this calculus. 2. Large renal calculi on the RIGHT similar to prior. Small nonobstructing LEFT renal calculus. 3. Appendix not identified but there are no secondary signs of appendicitis. 4. Large hiatal hernia. Electronically Signed   By: Suzy Bouchard M.D.   On: 05/03/2017 16:45   Dg Chest Port 1 View  Result Date: 05/03/2017 CLINICAL DATA:  Nausea and diarrhea.  Abdominal pain EXAM: PORTABLE CHEST 1 VIEW COMPARISON:  October 07, 2016 FINDINGS: No edema or consolidation. Heart is mildly enlarged with pulmonary vascular within normal limits. No adenopathy. There is aortic atherosclerosis. There is extensive arthropathy in both shoulders, more severe on the left than on the right. IMPRESSION: Cardiomegaly.  Aortic atherosclerosis.  No edema or consolidation. Electronically Signed   By: Lowella Grip III M.D.   On: 05/03/2017 13:51    Procedures Procedures (including critical care time)  Medications Ordered in ED Medications  dextrose 50 % solution 25  mL (not administered)  sodium chloride 0.9 % bolus 500 mL (not administered)  morphine 2 MG/ML injection 4 mg (not administered)  ondansetron (ZOFRAN) injection 4 mg (not administered)  dextrose 50 % solution 25 g (not administered)     Initial Impression / Assessment and Plan / ED Course  I have reviewed the triage vital signs and the nursing notes.  Pertinent labs & imaging results that were available during my care of the patient were reviewed by me and considered in my medical decision making (see chart for details).  Iv ns. Labs. Cxr.  Glucose low, hx dm, took insulin but ate very little today.  d50 iv. Po fluids.  CT pending.  Reviewed nursing notes and prior charts for additional history.   Morphine iv. zofran iv.  Recheck pain improved but persists.   Dilaudid .5 mg iv.   Ct results noted.  Discussed w pt/family.  Recheck, pain improved. Afeb. No nv.  No infection on ua, le and nit neg, 0 wbc, 0 bact.   Patient currently appears stable for d/c.  Will refer to urology f/u in coming week.     Final Clinical Impressions(s) / ED Diagnoses   Final diagnoses:  None    New Prescriptions New Prescriptions   No medications on file  Lajean Saver, MD 05/03/17 1655

## 2017-05-03 NOTE — Discharge Instructions (Signed)
It was our pleasure to provide your ER care today - we hope that you feel better.  Your ct scan shows bilateral kidney stones, as well as a 6 mm stone in the distal right ureter.    Rest. Drink adequate fluids.  Strain urine.  Take flomax as prescribed.  You may take percocet as need for pain. No driving for the next 6 hours or when taking percocet. Also, do not take tylenol or acetaminophen containing medication when taking percocet.  Follow up with urologist in the coming week - see referral - call office tomorrow morning to arrange appointment.  Return to ER if worse, new symptoms, worsening or severe pain, fevers, persistent vomiting, other concern.

## 2017-05-03 NOTE — ED Notes (Addendum)
Verbalized understanding discharge instructions. In no acute distress.  Pt verbalized understanding of follow-up.  Sts she already has a kidney doctor on [address].    Pt taken out by Aaron Edelman NT.

## 2017-05-03 NOTE — ED Notes (Signed)
Bed: WA08 Expected date:  Expected time:  Means of arrival:  Comments: Hold for triage 

## 2017-05-03 NOTE — ED Notes (Signed)
X-ray at bedside

## 2017-05-03 NOTE — ED Notes (Signed)
Pt continually stating she is hungry.  Informed we will address food once CT results.  Verbalized understanding.

## 2017-05-03 NOTE — ED Notes (Signed)
Unsuccessful IV attempt x2. Kaitlin RN made aware.

## 2017-05-03 NOTE — ED Notes (Signed)
CBG rechecked. 

## 2017-05-03 NOTE — ED Notes (Signed)
Pt given diet coke and graham crackers.

## 2017-05-03 NOTE — ED Notes (Addendum)
Pt drank some orange juice due to low CBG

## 2017-05-06 ENCOUNTER — Encounter (HOSPITAL_COMMUNITY): Payer: Self-pay | Admitting: Emergency Medicine

## 2017-05-06 ENCOUNTER — Emergency Department (HOSPITAL_COMMUNITY): Payer: Medicare HMO

## 2017-05-06 ENCOUNTER — Observation Stay (HOSPITAL_COMMUNITY)
Admission: EM | Admit: 2017-05-06 | Discharge: 2017-05-07 | Disposition: A | Discharge status: Home / Self Care | Payer: Medicare HMO | Attending: Internal Medicine | Admitting: Internal Medicine

## 2017-05-06 DIAGNOSIS — I5032 Chronic diastolic (congestive) heart failure: Secondary | ICD-10-CM | POA: Insufficient documentation

## 2017-05-06 DIAGNOSIS — G4733 Obstructive sleep apnea (adult) (pediatric): Secondary | ICD-10-CM | POA: Diagnosis not present

## 2017-05-06 DIAGNOSIS — Z6838 Body mass index (BMI) 38.0-38.9, adult: Secondary | ICD-10-CM | POA: Insufficient documentation

## 2017-05-06 DIAGNOSIS — E1143 Type 2 diabetes mellitus with diabetic autonomic (poly)neuropathy: Secondary | ICD-10-CM | POA: Diagnosis present

## 2017-05-06 DIAGNOSIS — Z9981 Dependence on supplemental oxygen: Secondary | ICD-10-CM | POA: Insufficient documentation

## 2017-05-06 DIAGNOSIS — S0990XA Unspecified injury of head, initial encounter: Secondary | ICD-10-CM | POA: Diagnosis not present

## 2017-05-06 DIAGNOSIS — I700 Atherosclerosis of aorta: Secondary | ICD-10-CM | POA: Diagnosis not present

## 2017-05-06 DIAGNOSIS — E1151 Type 2 diabetes mellitus with diabetic peripheral angiopathy without gangrene: Secondary | ICD-10-CM | POA: Diagnosis not present

## 2017-05-06 DIAGNOSIS — J449 Chronic obstructive pulmonary disease, unspecified: Secondary | ICD-10-CM | POA: Diagnosis present

## 2017-05-06 DIAGNOSIS — N132 Hydronephrosis with renal and ureteral calculous obstruction: Secondary | ICD-10-CM | POA: Insufficient documentation

## 2017-05-06 DIAGNOSIS — M1990 Unspecified osteoarthritis, unspecified site: Secondary | ICD-10-CM | POA: Diagnosis present

## 2017-05-06 DIAGNOSIS — I5030 Unspecified diastolic (congestive) heart failure: Secondary | ICD-10-CM | POA: Diagnosis present

## 2017-05-06 DIAGNOSIS — J9611 Chronic respiratory failure with hypoxia: Secondary | ICD-10-CM | POA: Insufficient documentation

## 2017-05-06 DIAGNOSIS — W19XXXA Unspecified fall, initial encounter: Secondary | ICD-10-CM

## 2017-05-06 DIAGNOSIS — R531 Weakness: Secondary | ICD-10-CM | POA: Diagnosis not present

## 2017-05-06 DIAGNOSIS — Y9389 Activity, other specified: Secondary | ICD-10-CM | POA: Insufficient documentation

## 2017-05-06 DIAGNOSIS — F329 Major depressive disorder, single episode, unspecified: Secondary | ICD-10-CM | POA: Diagnosis not present

## 2017-05-06 DIAGNOSIS — Z8249 Family history of ischemic heart disease and other diseases of the circulatory system: Secondary | ICD-10-CM | POA: Insufficient documentation

## 2017-05-06 DIAGNOSIS — E669 Obesity, unspecified: Secondary | ICD-10-CM

## 2017-05-06 DIAGNOSIS — N184 Chronic kidney disease, stage 4 (severe): Secondary | ICD-10-CM | POA: Diagnosis not present

## 2017-05-06 DIAGNOSIS — Z885 Allergy status to narcotic agent status: Secondary | ICD-10-CM | POA: Insufficient documentation

## 2017-05-06 DIAGNOSIS — W06XXXA Fall from bed, initial encounter: Secondary | ICD-10-CM | POA: Diagnosis not present

## 2017-05-06 DIAGNOSIS — E872 Acidosis: Secondary | ICD-10-CM | POA: Insufficient documentation

## 2017-05-06 DIAGNOSIS — K449 Diaphragmatic hernia without obstruction or gangrene: Secondary | ICD-10-CM | POA: Diagnosis not present

## 2017-05-06 DIAGNOSIS — Z886 Allergy status to analgesic agent status: Secondary | ICD-10-CM | POA: Insufficient documentation

## 2017-05-06 DIAGNOSIS — E1121 Type 2 diabetes mellitus with diabetic nephropathy: Secondary | ICD-10-CM | POA: Insufficient documentation

## 2017-05-06 DIAGNOSIS — Z833 Family history of diabetes mellitus: Secondary | ICD-10-CM | POA: Insufficient documentation

## 2017-05-06 DIAGNOSIS — Z79899 Other long term (current) drug therapy: Secondary | ICD-10-CM | POA: Insufficient documentation

## 2017-05-06 DIAGNOSIS — R0689 Other abnormalities of breathing: Secondary | ICD-10-CM | POA: Diagnosis present

## 2017-05-06 DIAGNOSIS — R251 Tremor, unspecified: Secondary | ICD-10-CM | POA: Insufficient documentation

## 2017-05-06 DIAGNOSIS — F419 Anxiety disorder, unspecified: Secondary | ICD-10-CM | POA: Insufficient documentation

## 2017-05-06 DIAGNOSIS — Y92003 Bedroom of unspecified non-institutional (private) residence as the place of occurrence of the external cause: Secondary | ICD-10-CM | POA: Diagnosis not present

## 2017-05-06 DIAGNOSIS — N200 Calculus of kidney: Secondary | ICD-10-CM | POA: Diagnosis not present

## 2017-05-06 DIAGNOSIS — R269 Unspecified abnormalities of gait and mobility: Secondary | ICD-10-CM | POA: Diagnosis not present

## 2017-05-06 DIAGNOSIS — I130 Hypertensive heart and chronic kidney disease with heart failure and stage 1 through stage 4 chronic kidney disease, or unspecified chronic kidney disease: Secondary | ICD-10-CM | POA: Diagnosis not present

## 2017-05-06 DIAGNOSIS — J9622 Acute and chronic respiratory failure with hypercapnia: Secondary | ICD-10-CM

## 2017-05-06 DIAGNOSIS — I6789 Other cerebrovascular disease: Secondary | ICD-10-CM | POA: Diagnosis not present

## 2017-05-06 DIAGNOSIS — D631 Anemia in chronic kidney disease: Secondary | ICD-10-CM | POA: Diagnosis not present

## 2017-05-06 DIAGNOSIS — M170 Bilateral primary osteoarthritis of knee: Secondary | ICD-10-CM | POA: Insufficient documentation

## 2017-05-06 DIAGNOSIS — H409 Unspecified glaucoma: Secondary | ICD-10-CM | POA: Diagnosis not present

## 2017-05-06 DIAGNOSIS — I739 Peripheral vascular disease, unspecified: Secondary | ICD-10-CM | POA: Diagnosis present

## 2017-05-06 DIAGNOSIS — Z7982 Long term (current) use of aspirin: Secondary | ICD-10-CM | POA: Insufficient documentation

## 2017-05-06 DIAGNOSIS — M419 Scoliosis, unspecified: Secondary | ICD-10-CM | POA: Insufficient documentation

## 2017-05-06 DIAGNOSIS — Z9119 Patient's noncompliance with other medical treatment and regimen: Secondary | ICD-10-CM | POA: Insufficient documentation

## 2017-05-06 DIAGNOSIS — K219 Gastro-esophageal reflux disease without esophagitis: Secondary | ICD-10-CM | POA: Diagnosis not present

## 2017-05-06 DIAGNOSIS — R9389 Abnormal findings on diagnostic imaging of other specified body structures: Secondary | ICD-10-CM

## 2017-05-06 DIAGNOSIS — Z794 Long term (current) use of insulin: Secondary | ICD-10-CM | POA: Insufficient documentation

## 2017-05-06 DIAGNOSIS — Z87891 Personal history of nicotine dependence: Secondary | ICD-10-CM | POA: Insufficient documentation

## 2017-05-06 DIAGNOSIS — R0602 Shortness of breath: Secondary | ICD-10-CM | POA: Diagnosis not present

## 2017-05-06 DIAGNOSIS — I10 Essential (primary) hypertension: Secondary | ICD-10-CM | POA: Diagnosis present

## 2017-05-06 DIAGNOSIS — N189 Chronic kidney disease, unspecified: Secondary | ICD-10-CM

## 2017-05-06 DIAGNOSIS — G253 Myoclonus: Secondary | ICD-10-CM | POA: Diagnosis not present

## 2017-05-06 DIAGNOSIS — R05 Cough: Secondary | ICD-10-CM | POA: Diagnosis not present

## 2017-05-06 DIAGNOSIS — E1122 Type 2 diabetes mellitus with diabetic chronic kidney disease: Secondary | ICD-10-CM | POA: Diagnosis not present

## 2017-05-06 DIAGNOSIS — Z96643 Presence of artificial hip joint, bilateral: Secondary | ICD-10-CM | POA: Insufficient documentation

## 2017-05-06 DIAGNOSIS — Z880 Allergy status to penicillin: Secondary | ICD-10-CM | POA: Insufficient documentation

## 2017-05-06 DIAGNOSIS — E785 Hyperlipidemia, unspecified: Secondary | ICD-10-CM | POA: Insufficient documentation

## 2017-05-06 DIAGNOSIS — R410 Disorientation, unspecified: Secondary | ICD-10-CM | POA: Diagnosis present

## 2017-05-06 DIAGNOSIS — Y998 Other external cause status: Secondary | ICD-10-CM | POA: Insufficient documentation

## 2017-05-06 DIAGNOSIS — E1169 Type 2 diabetes mellitus with other specified complication: Secondary | ICD-10-CM | POA: Diagnosis present

## 2017-05-06 LAB — CBC WITH DIFFERENTIAL/PLATELET
BASOS ABS: 0 10*3/uL (ref 0.0–0.1)
BASOS PCT: 0 %
EOS PCT: 1 %
Eosinophils Absolute: 0.1 10*3/uL (ref 0.0–0.7)
HEMATOCRIT: 33 % — AB (ref 36.0–46.0)
Hemoglobin: 10 g/dL — ABNORMAL LOW (ref 12.0–15.0)
Lymphocytes Relative: 17 %
Lymphs Abs: 1.3 10*3/uL (ref 0.7–4.0)
MCH: 25 pg — ABNORMAL LOW (ref 26.0–34.0)
MCHC: 30.3 g/dL (ref 30.0–36.0)
MCV: 82.5 fL (ref 78.0–100.0)
MONO ABS: 0.3 10*3/uL (ref 0.1–1.0)
Monocytes Relative: 3 %
NEUTROS ABS: 6.1 10*3/uL (ref 1.7–7.7)
Neutrophils Relative %: 79 %
PLATELETS: 271 10*3/uL (ref 150–400)
RBC: 4 MIL/uL (ref 3.87–5.11)
RDW: 16.1 % — AB (ref 11.5–15.5)
WBC: 7.8 10*3/uL (ref 4.0–10.5)

## 2017-05-06 LAB — COMPREHENSIVE METABOLIC PANEL
ALT: 12 U/L — ABNORMAL LOW (ref 14–54)
ANION GAP: 12 (ref 5–15)
AST: 18 U/L (ref 15–41)
Albumin: 3.4 g/dL — ABNORMAL LOW (ref 3.5–5.0)
Alkaline Phosphatase: 93 U/L (ref 38–126)
BILIRUBIN TOTAL: 0.6 mg/dL (ref 0.3–1.2)
BUN: 23 mg/dL — AB (ref 6–20)
CO2: 27 mmol/L (ref 22–32)
Calcium: 9.2 mg/dL (ref 8.9–10.3)
Chloride: 101 mmol/L (ref 101–111)
Creatinine, Ser: 2.68 mg/dL — ABNORMAL HIGH (ref 0.44–1.00)
GFR, EST AFRICAN AMERICAN: 18 mL/min — AB (ref >60)
GFR, EST NON AFRICAN AMERICAN: 16 mL/min — AB (ref >60)
Glucose, Bld: 201 mg/dL — ABNORMAL HIGH (ref 65–99)
POTASSIUM: 4 mmol/L (ref 3.5–5.1)
Sodium: 140 mmol/L (ref 135–145)
TOTAL PROTEIN: 7 g/dL (ref 6.5–8.1)

## 2017-05-06 LAB — RAPID URINE DRUG SCREEN, HOSP PERFORMED
AMPHETAMINES: NOT DETECTED
BARBITURATES: NOT DETECTED
BENZODIAZEPINES: NOT DETECTED
COCAINE: NOT DETECTED
Opiates: POSITIVE — AB
Tetrahydrocannabinol: NOT DETECTED

## 2017-05-06 LAB — I-STAT ARTERIAL BLOOD GAS, ED
ACID-BASE EXCESS: 4 mmol/L — AB (ref 0.0–2.0)
Bicarbonate: 31.1 mmol/L — ABNORMAL HIGH (ref 20.0–28.0)
O2 Saturation: 87 %
PH ART: 7.323 — AB (ref 7.350–7.450)
TCO2: 33 mmol/L (ref 0–100)
pCO2 arterial: 60 mmHg — ABNORMAL HIGH (ref 32.0–48.0)
pO2, Arterial: 59 mmHg — ABNORMAL LOW (ref 83.0–108.0)

## 2017-05-06 LAB — URINALYSIS, ROUTINE W REFLEX MICROSCOPIC
Bilirubin Urine: NEGATIVE
Glucose, UA: NEGATIVE mg/dL
HGB URINE DIPSTICK: NEGATIVE
Ketones, ur: NEGATIVE mg/dL
Leukocytes, UA: NEGATIVE
Nitrite: NEGATIVE
PROTEIN: NEGATIVE mg/dL
Specific Gravity, Urine: 1.01 (ref 1.005–1.030)
pH: 5 (ref 5.0–8.0)

## 2017-05-06 LAB — AMMONIA: Ammonia: 29 umol/L (ref 9–35)

## 2017-05-06 LAB — I-STAT TROPONIN, ED: Troponin i, poc: 0.02 ng/mL (ref 0.00–0.08)

## 2017-05-06 LAB — GLUCOSE, CAPILLARY
GLUCOSE-CAPILLARY: 326 mg/dL — AB (ref 65–99)
Glucose-Capillary: 209 mg/dL — ABNORMAL HIGH (ref 65–99)

## 2017-05-06 LAB — LIPASE, BLOOD: Lipase: 17 U/L (ref 11–51)

## 2017-05-06 MED ORDER — PANTOPRAZOLE SODIUM 40 MG PO TBEC
40.0000 mg | DELAYED_RELEASE_TABLET | Freq: Every day | ORAL | Status: DC
  Administered 2017-05-06 – 2017-05-07 (×2): 40 mg via ORAL
  Filled 2017-05-06 (×2): qty 1

## 2017-05-06 MED ORDER — IPRATROPIUM-ALBUTEROL 0.5-2.5 (3) MG/3ML IN SOLN: 3.0000 mL | Freq: Four times a day (QID) | RESPIRATORY_TRACT | Status: DC | PRN

## 2017-05-06 MED ORDER — ASPIRIN EC 81 MG PO TBEC
81.0000 mg | DELAYED_RELEASE_TABLET | Freq: Every day | ORAL | Status: DC
  Administered 2017-05-06 – 2017-05-07 (×2): 81 mg via ORAL
  Filled 2017-05-06 (×2): qty 1

## 2017-05-06 MED ORDER — HEPARIN SODIUM (PORCINE) 5000 UNIT/ML IJ SOLN
5000.0000 [IU] | Freq: Three times a day (TID) | INTRAMUSCULAR | Status: DC
  Administered 2017-05-06 – 2017-05-07 (×4): 5000 [IU] via SUBCUTANEOUS
  Filled 2017-05-06 (×4): qty 1

## 2017-05-06 MED ORDER — FENTANYL CITRATE (PF) 100 MCG/2ML IJ SOLN
12.5000 ug | INTRAMUSCULAR | Status: DC | PRN
  Administered 2017-05-07 (×2): 12.5 ug via INTRAVENOUS
  Filled 2017-05-06 (×2): qty 2

## 2017-05-06 MED ORDER — INSULIN ASPART 100 UNIT/ML ~~LOC~~ SOLN
0.0000 [IU] | Freq: Three times a day (TID) | SUBCUTANEOUS | Status: DC
Start: 2017-05-06 — End: 2017-05-06

## 2017-05-06 MED ORDER — TIMOLOL MALEATE 0.5 % OP SOLN
1.0000 [drp] | Freq: Two times a day (BID) | OPHTHALMIC | Status: DC
  Administered 2017-05-07 (×2): 1 [drp] via OPHTHALMIC
  Filled 2017-05-06: qty 5

## 2017-05-06 MED ORDER — FUROSEMIDE 40 MG PO TABS
40.0000 mg | ORAL_TABLET | Freq: Two times a day (BID) | ORAL | Status: DC
  Administered 2017-05-06 – 2017-05-07 (×2): 40 mg via ORAL
  Filled 2017-05-06 (×2): qty 1

## 2017-05-06 MED ORDER — METOPROLOL SUCCINATE ER 25 MG PO TB24
25.0000 mg | ORAL_TABLET | Freq: Every day | ORAL | Status: DC
Start: 2017-05-06 — End: 2017-05-07
  Administered 2017-05-07: 25 mg via ORAL
  Filled 2017-05-06: qty 1

## 2017-05-06 MED ORDER — GABAPENTIN 100 MG PO CAPS
200.0000 mg | ORAL_CAPSULE | Freq: Three times a day (TID) | ORAL | Status: DC
  Administered 2017-05-06 – 2017-05-07 (×3): 200 mg via ORAL
  Filled 2017-05-06 (×3): qty 2

## 2017-05-06 MED ORDER — TAMSULOSIN HCL 0.4 MG PO CAPS
0.4000 mg | ORAL_CAPSULE | Freq: Every day | ORAL | Status: DC
  Administered 2017-05-06 – 2017-05-07 (×2): 0.4 mg via ORAL
  Filled 2017-05-06 (×2): qty 1

## 2017-05-06 MED ORDER — POTASSIUM CHLORIDE CRYS ER 20 MEQ PO TBCR
20.0000 meq | EXTENDED_RELEASE_TABLET | Freq: Every day | ORAL | Status: DC
  Administered 2017-05-07: 20 meq via ORAL
  Filled 2017-05-06 (×2): qty 1

## 2017-05-06 MED ORDER — SIMVASTATIN 20 MG PO TABS
20.0000 mg | ORAL_TABLET | Freq: Every day | ORAL | Status: DC
Start: 2017-05-06 — End: 2017-05-07
  Administered 2017-05-07: 20 mg via ORAL
  Filled 2017-05-06 (×2): qty 1

## 2017-05-06 MED ORDER — ALBUTEROL SULFATE (2.5 MG/3ML) 0.083% IN NEBU: 2.5000 mg | INHALATION_SOLUTION | RESPIRATORY_TRACT | Status: DC | PRN

## 2017-05-06 MED ORDER — INSULIN ASPART 100 UNIT/ML ~~LOC~~ SOLN
0.0000 [IU] | SUBCUTANEOUS | Status: DC
  Administered 2017-05-07: 11 [IU] via SUBCUTANEOUS
  Administered 2017-05-07: 3 [IU] via SUBCUTANEOUS

## 2017-05-06 MED ORDER — SODIUM CHLORIDE 0.9 % IV SOLN: Freq: Once | INTRAVENOUS | Status: DC

## 2017-05-06 MED ORDER — DULOXETINE HCL 30 MG PO CPEP
30.0000 mg | ORAL_CAPSULE | Freq: Every day | ORAL | Status: DC
  Administered 2017-05-06 – 2017-05-07 (×2): 30 mg via ORAL
  Filled 2017-05-06 (×2): qty 1

## 2017-05-06 MED ORDER — ONDANSETRON HCL 4 MG PO TABS: 4.0000 mg | ORAL_TABLET | Freq: Four times a day (QID) | ORAL | Status: DC | PRN

## 2017-05-06 MED ORDER — ONDANSETRON HCL 4 MG/2ML IJ SOLN: 4.0000 mg | Freq: Four times a day (QID) | INTRAMUSCULAR | Status: DC | PRN

## 2017-05-06 MED ORDER — BUDESONIDE 0.25 MG/2ML IN SUSP
0.2500 mg | Freq: Two times a day (BID) | RESPIRATORY_TRACT | Status: DC
  Administered 2017-05-06 – 2017-05-07 (×2): 0.25 mg via RESPIRATORY_TRACT
  Filled 2017-05-06 (×2): qty 2

## 2017-05-06 MED ORDER — SERTRALINE HCL 50 MG PO TABS
50.0000 mg | ORAL_TABLET | Freq: Every day | ORAL | Status: DC
Start: 2017-05-06 — End: 2017-05-07
  Administered 2017-05-06 – 2017-05-07 (×2): 50 mg via ORAL
  Filled 2017-05-06 (×2): qty 1

## 2017-05-06 MED ORDER — GUAIFENESIN 100 MG/5ML PO SOLN
200.0000 mg | Freq: Three times a day (TID) | ORAL | Status: DC | PRN
  Administered 2017-05-07: 200 mg via ORAL
  Filled 2017-05-06 (×3): qty 10

## 2017-05-06 MED ORDER — CILOSTAZOL 100 MG PO TABS
100.0000 mg | ORAL_TABLET | Freq: Every day | ORAL | Status: DC
Start: 2017-05-06 — End: 2017-05-07
  Administered 2017-05-06 – 2017-05-07 (×2): 100 mg via ORAL
  Filled 2017-05-06 (×2): qty 1

## 2017-05-06 MED ORDER — BISACODYL 10 MG RE SUPP: 10.0000 mg | Freq: Every day | RECTAL | Status: DC | PRN

## 2017-05-06 MED ORDER — SUCRALFATE 1 G PO TABS
1.0000 g | ORAL_TABLET | Freq: Every day | ORAL | Status: DC
  Administered 2017-05-06 – 2017-05-07 (×2): 1 g via ORAL
  Filled 2017-05-06 (×2): qty 1

## 2017-05-06 MED ORDER — HYDRALAZINE HCL 25 MG PO TABS: 50.0000 mg | ORAL_TABLET | Freq: Three times a day (TID) | ORAL | Status: DC

## 2017-05-06 MED ORDER — SENNOSIDES-DOCUSATE SODIUM 8.6-50 MG PO TABS: 1.0000 | ORAL_TABLET | Freq: Every evening | ORAL | Status: DC | PRN

## 2017-05-06 MED ORDER — HYDRALAZINE HCL 25 MG PO TABS
50.0000 mg | ORAL_TABLET | Freq: Three times a day (TID) | ORAL | Status: DC
  Administered 2017-05-07: 50 mg via ORAL
  Filled 2017-05-06 (×2): qty 2

## 2017-05-06 MED ORDER — SODIUM CHLORIDE 0.9 % IV SOLN
INTRAVENOUS | Status: DC
  Administered 2017-05-06: 14:00:00 via INTRAVENOUS

## 2017-05-06 NOTE — ED Provider Notes (Signed)
[facility] DEPT Provider Note   CSN: [id number] Arrival date & time: 05/06/17  6060     History   Chief Complaint Chief Complaint  Patient presents with  . Fall    HPI Michelle Buck is a 81 y.o. female.  HPI Patient reports that she fell in the bathroom. She states for a day she's been having unusual shaking and jerking motions. She was trying to transition in the bathroom to a wheelchair and had shaking and ended up falling onto the floor. She does not identify any specific area of pain. She does endorse some chest pain although difficult to determine if this is secondary to fall or independent. The patient cannot give much detail to the quality of pain. She denies shortness of breath. She denies fever or cough. She predominantly reports shaking and tremor that started within the past day. She denies focal weakness or numbness but feels generally weak. Patient was seen and diagnosed with a kidney stone within the past several days. She was started on a new pain medication. Family members report that in addition to starting the new pain medication, she also is noncompliant with her CPAP because it is uncomfortable and provokes anxiety. Patient does seem moderately confused. Patient was brought by EMS from home. Past Medical History:  Diagnosis Date  . Acute bronchitis   . Anxiety   . Arthritis   . Chronic kidney disease, stage II (mild)   . Complications affecting other specified body systems, hypertension   . Congestive heart failure, unspecified    stress test in March at [facility]  . COPD (chronic obstructive pulmonary disease) ([place])    present for several years, diagnosed in the last few years  . Degenerative arthritis of knee, bilateral   . Diabetes mellitus   . Diaphragmatic hernia without mention of obstruction or gangrene   . Disorder of bone and cartilage, unspecified   . GERD (gastroesophageal reflux disease)   . Hiatal hernia   . Hyperlipidemia   .  Hypertension   . Hypopotassemia   . Insomnia, unspecified   . Lumbago   . Obese   . Osteoarthrosis, unspecified whether generalized or localized, unspecified site   . PONV (postoperative nausea and vomiting)   . Scoliosis   . Secondary diabetes mellitus with renal manifestations, not stated as uncontrolled, or unspecified(249.40)   . Unspecified glaucoma(365.9)   . Unspecified hereditary and idiopathic peripheral neuropathy     Patient Active Problem List   Diagnosis Date Noted  . Osteoarthritis 03/25/2017  . Shoulder pain 03/25/2017  . Abnormality of gait 03/25/2017  . COPD (chronic obstructive pulmonary disease) ([place]) 09/25/2016  . Diastolic CHF ([place]) 04/59/9774  . Chest pressure 09/25/2016  . Dyspnea and respiratory abnormality 09/23/2016  . OSA (obstructive sleep apnea) 09/02/2016  . Other fatigue 06/23/2016  . COPD exacerbation ([place]) 05/21/2016  . PAD (peripheral artery disease) ([place]) 05/21/2016  . Glaucoma 05/21/2016  . Bilateral edema of lower extremity   . Leg edema 01/01/2016  . CKD stage 3 due to type 2 diabetes mellitus ([place])   . Healthcare-associated pneumonia   . CHF (congestive heart failure) ([place]) 10/29/2015  . Chronic obstructive pulmonary disease ([place]) 10/29/2015  . Lumbago   . CKD (chronic kidney disease)   . Cellulitis 09/11/2015  . Hypomagnesemia 09/11/2015  . Chronic kidney disease (CKD), stage IV (severe) ([place]) 09/11/2015  . Diabetes mellitus with diabetic nephropathy, with long-term current use of insulin ([place]) 09/11/2015  . Anemia of chronic kidney failure 09/11/2015  .  COPD with acute exacerbation ([place]) 09/11/2015  . Morbid obesity ([place]) 08/14/2015  . Peripheral autonomic neuropathy due to diabetes mellitus ([place]) 10/24/2013  . Essential hypertension, benign 02/27/2013  . Acute on chronic diastolic congestive heart failure ([place]) 03/02/2012  . Diabetes mellitus type 2 in obese ([place]) 03/02/2012    Past Surgical History:  Procedure Laterality Date   . ABDOMINAL HYSTERECTOMY    . COLONOSCOPY  08/06/2011  . FOOT SURGERY    . KNEE ARTHROSCOPY     bilateral  . SHOULDER SURGERY    . TOTAL HIP ARTHROPLASTY     bilateral    OB History    No data available       Home Medications    Prior to Admission medications   Medication Sig Start Date End Date Taking? Authorizing Provider  albuterol (PROVENTIL HFA;VENTOLIN HFA) 108 (90 Base) MCG/ACT inhaler Inhale 2 puffs into the lungs every 4 (four) hours as needed for wheezing or shortness of breath.   Yes [provider]  aspirin EC 81 MG tablet Take 81 mg by mouth daily.   Yes [provider]  budesonide (PULMICORT) 0.25 MG/2ML nebulizer solution Take one ampule in Neb twice daily routinely for COPD  J44.1 Patient taking differently: Take 0.25 mg by nebulization 2 (two) times daily. Take one ampule in Neb twice daily routinely for COPD  J44.1 11/24/16  Yes [name], NP  Cholecalciferol (VITAMIN D3) 2000 units TABS Take 2,000 Units by mouth daily.   Yes [provider]  cilostazol (PLETAL) 100 MG tablet Take 1 tablet (100 mg total) by mouth daily. 04/16/17  Yes [name], NP  DULoxetine (CYMBALTA) 30 MG capsule Take one capsule by mouth once daily to help anxiety and pains 04/16/17  Yes [name], NP  esomeprazole (NEXIUM) 40 MG capsule Take 1 capsule (40 mg total) by mouth daily. 04/16/17  Yes [name], NP  furosemide (LASIX) 40 MG tablet TAKE 1 TABLET BY MOUTH TWICE A DAY 02/08/17  Yes [name], NP  gabapentin (NEURONTIN) 100 MG capsule Take two capsules by mouth three times daily 04/16/17  Yes Eubanks, Carlos American, NP  guaifenesin (ROBITUSSIN) 100 MG/5ML syrup Take 200 mg by mouth 3 (three) times daily as needed for cough.   Yes [provider]  hydrALAZINE (APRESOLINE) 50 MG tablet Take 1 tablet (50 mg total) by mouth 3 (three) times daily. 04/16/17  Yes [name], NP  HYDROcodone-acetaminophen (NORCO)  10-325 MG tablet Take 1 tablet by mouth every 6 (six) hours as needed. Patient taking differently: Take 1 tablet by mouth every 6 (six) hours as needed for moderate pain.  04/28/17  Yes Eulas Post, Monica, DO  insulin aspart protamine- aspart (NOVOLOG MIX 70/30) (70-30) 100 UNIT/ML injection Inject 40 units subcutaneously with breakfast and inject 25 units subcutaneously with dinner to control blood sugar 04/20/17  Yes [name], NP  Insulin Syringe-Needle U-100 (EASY TOUCH INSULIN SYRINGE) 31G X 5/16" 0.5 ML MISC USE UP TO 2 TIMES A DAY AS DIRECTED. 04/16/17  Yes [name], NP  ipratropium-albuterol (DUONEB) 0.5-2.5 (3) MG/3ML SOLN Take 45mls by nebulization every 6 hours as needed for SOB/Wheezing 01/09/16  Yes [name], NP  linaclotide (LINZESS) 145 MCG CAPS capsule Take 1 capsule (145 mcg total) by mouth 2 (two) times daily. 04/20/17  Yes [name], NP  lubiprostone (AMITIZA) 24 MCG capsule Take one capsule by mouth twice daily with meals 04/16/17  Yes Eubanks,  Carlos American, NP  metoprolol succinate (TOPROL-XL) 25 MG 24 hr tablet Take one tablet by mouth once daily with or immediately following a meal for blood pressure 04/16/17  Yes Eubanks, Carlos American, NP  ondansetron (ZOFRAN ODT) 8 MG disintegrating tablet Take 1 tablet (8 mg total) by mouth every 8 (eight) hours as needed for nausea or vomiting. 05/03/17  Yes Lajean Saver, MD  oxyCODONE-acetaminophen (PERCOCET/ROXICET) 5-325 MG tablet Take 1 tablet by mouth every 6 (six) hours as needed for severe pain. 05/03/17  Yes Lajean Saver, MD  potassium chloride SA (K-DUR,KLOR-CON) 20 MEQ tablet Take 1 tablet (20 mEq total) by mouth daily. 04/16/17  Yes [name], NP  sertraline (ZOLOFT) 50 MG tablet Take 1 tablet (50 mg total) by mouth daily. 04/16/17  Yes [name], NP  simvastatin (ZOCOR) 20 MG tablet Take 1 tablet (20 mg total) by mouth daily. 04/16/17  Yes [name], NP  sucralfate (CARAFATE) 1 g tablet Take  one tablet by mouth twice daily on empty stomach 04/16/17  Yes [name], NP  tamsulosin (FLOMAX) 0.4 MG CAPS capsule Take 1 capsule (0.4 mg total) by mouth daily. 05/03/17  Yes Lajean Saver, MD  timolol (TIMOPTIC) 0.5 % ophthalmic solution INSTILL 1 DROP INTO EACH EYE TWICE A DAY 02/12/16  Yes Eubanks, Carlos American, NP  TRAVATAN Z 0.004 % SOLN ophthalmic solution Place 1 drop into both eyes at bedtime.  04/09/16  Yes [provider]  diclofenac sodium (VOLTAREN) 1 % GEL APPLY 4 GRAM TOPICALLY TWICE DAILY as needed for pain Patient not taking: Reported on 05/06/2017 06/08/16   [name], NP    Family History Family History  Problem Relation Age of Onset  . Diabetes Mother   . Heart disease Mother   . Heart disease Brother   . Diabetes Brother   . Kidney disease Father   . Scoliosis Sister   . Diabetes Brother   . Diabetes Brother   . Heart disease Brother   . Scoliosis Brother   . Stroke Brother   . Heart attack Brother     Social History Social History  Substance Use Topics  . Smoking status: Former Smoker    Packs/day: 0.50    Years: 3.00    Types: Cigarettes    Quit date: 02/27/1997  . Smokeless tobacco: Never Used     Comment: remote history - stopped 19 years ago; 20 pack year history  . Alcohol use No     Allergies   Tramadol; Codeine; Hydrocodone; Penicillins; and Tylenol [acetaminophen]   Review of Systems Review of Systems 10 Systems reviewed and are negative for acute change except as noted in the HPI.   Physical Exam Updated Vital Signs BP 120/69   Pulse 75   Temp 98.8 F (37.1 C) (Oral)   Resp 15   Ht 5\' 2"  (1.575 m)   Wt 99.8 kg (220 lb)   SpO2 96%   BMI 40.24 kg/m   Physical Exam  Constitutional:  Patient is alert. She does not have respiratory distress. She does seem mildly confused. Deconditioning with central obesity.  HENT:  Head: Normocephalic and atraumatic.  Right Ear: External ear normal.  Left Ear: External ear  normal.  Nose: Nose normal.  Mouth/Throat: Oropharynx is clear and moist.  Eyes: EOM are normal. Pupils are equal, round, and reactive to light.  Neck: Neck supple.  Cardiovascular: Normal rate, regular rhythm, normal heart sounds and intact distal pulses.   Pulmonary/Chest:  Effort normal and breath sounds normal.  Abdominal: Soft. She exhibits no distension. There is tenderness.  Patient endorses some mild tenderness to the lower abdomen. No guarding. No localizing.  Musculoskeletal:  No extremity deformity. Patient has old surgical scars on her left shoulder. She can perform range of motion both upper extremities without evidence of pain or significant limitation other than deconditioning. Lower extremities patient can individually elevated off the bed she does not endorse pain if I do some lateral and medial movement of the hip. About 1+ pitting edema of the lower extremities. No cellulitis or wounds.  Neurological:  Patient is alert and providing some history. She does seem mildly confused. She has intermittent coarse jerking action of her upper extremities and trunk. No seizure activity. Patient follows commands to perform grip both upper extremities and also to assist in elevating both legs off of the bed. She does not have localizing motor dysfunction. She however does seem generally weak and deconditioned. Speech is clear without slurring. She does not have an apparent aphasia. She is however preoccupied with the monitors and items in the bed.  Skin: Skin is warm and dry. There is pallor.  Psychiatric: She has a normal mood and affect.  Patient is pleasantly interactive.     ED Treatments / Results  Labs (all labs ordered are listed, but only abnormal results are displayed) Labs Reviewed  COMPREHENSIVE METABOLIC PANEL - Abnormal; Notable for the following:       Result Value   Glucose, Bld 201 (*)    BUN 23 (*)    Creatinine, Ser 2.68 (*)    Albumin 3.4 (*)    ALT 12 (*)    GFR  calc non Af Amer 16 (*)    GFR calc Af Amer 18 (*)    All other components within normal limits  CBC WITH DIFFERENTIAL/PLATELET - Abnormal; Notable for the following:    Hemoglobin 10.0 (*)    HCT 33.0 (*)    MCH 25.0 (*)    RDW 16.1 (*)    All other components within normal limits  URINALYSIS, ROUTINE W REFLEX MICROSCOPIC - Abnormal; Notable for the following:    Color, Urine STRAW (*)    All other components within normal limits  RAPID URINE DRUG SCREEN, HOSP PERFORMED - Abnormal; Notable for the following:    Opiates POSITIVE (*)    All other components within normal limits  I-STAT ARTERIAL BLOOD GAS, ED - Abnormal; Notable for the following:    pH, Arterial 7.323 (*)    pCO2 arterial 60.0 (*)    pO2, Arterial 59.0 (*)    Bicarbonate 31.1 (*)    Acid-Base Excess 4.0 (*)    All other components within normal limits  LIPASE, BLOOD  AMMONIA  BLOOD GAS, ARTERIAL  I-STAT TROPOININ, ED    EKG  EKG Interpretation None       Radiology Ct Head Wo Contrast  Result Date: 05/06/2017 CLINICAL DATA:  Fall getting out of bed this morning. Weakness for 3 days. EXAM: CT HEAD WITHOUT CONTRAST TECHNIQUE: Contiguous axial images were obtained from the base of the skull through the vertex without intravenous contrast. COMPARISON:  None available FINDINGS: Brain: No evidence of acute cortical infarction, hemorrhage, hydrocephalus, extra-axial collection or mass lesion/mass effect. Bilateral patchy low-density in the deep cerebral white matter tracts with indistinct deep gray nuclei, likely primarily chronic but superimposed infarct could be obscured. Vascular: No hyperdense vessel.  No unexpected calcification. Skull: No acute finding. Sinuses/Orbits:  Bilateral cataract resection. Sequela of resolved left mastoiditis. IMPRESSION: 1. No definite acute finding. 2. Chronic small vessel ischemic injury in the deep white matter and basal ganglia. If there is a focal deficit, note that these changes  could obscure acute ischemia. Electronically Signed   By: Monte Fantasia M.D.   On: 05/06/2017 09:39   Dg Chest Port 1 View  Result Date: 05/06/2017 CLINICAL DATA:  Recent fall, shortness of breath and cough EXAM: PORTABLE CHEST 1 VIEW COMPARISON:  05/03/2017 FINDINGS: Cardiac shadow remains enlarged. The lungs are well aerated bilaterally. Vascular congestion is seen. Mild interstitial changes are noted. No focal confluent infiltrate is seen. IMPRESSION: Mild vascular congestion. Electronically Signed   By: Inez Catalina M.D.   On: 05/06/2017 08:31    Procedures Procedures (including critical care time)  Medications Ordered in ED Medications  0.9 %  sodium chloride infusion (not administered)     Initial Impression / Assessment and Plan / ED Course  I have reviewed the triage vital signs and the nursing notes.  Pertinent labs & imaging results that were available during my care of the patient were reviewed by me and considered in my medical decision making (see chart for details).       Final Clinical Impressions(s) / ED Diagnoses   Final diagnoses:  Generalized weakness  Fall, initial encounter  Acute and chronic respiratory failure with hypercapnia (HCC)  Myoclonic jerking  Kidney stone   Patient episode of general weakness and confusion this morning. Patient is fairly noncompliant with CPAP. Also in addition to this she recently started pain medication for a diagnosed kidney stone within the past 3 days. I suspect she developed hypercapnia from a combination of pain medication and failure to use CPAP. Patient showed evidence of myoclonic jerking. She has however maintained her airway and is not somnolent. Over the course of observation her clarity of mental status is improved. Patient continues to have some myoclonic jerking in general weakness. Plan will be for admission for observation for resolution of hypercapnia and general weakness. At this time, patient is not requiring  pain medication for the previously diagnosed kidney stone. Depending on patient's response without pain medication, she may need further evaluation for that finding. New Prescriptions New Prescriptions   No medications on file     Charlesetta Shanks, MD 05/06/17 1200

## 2017-05-06 NOTE — ED Notes (Signed)
Hospitalist at bedside 

## 2017-05-06 NOTE — H&P (Signed)
History and Physical    [name] IRW:[id number] DOB: 14-Jun-1936 DOA: 05/06/2017   PCP: [name], NP   Patient coming from:  Home    Chief Complaint: Generalized weakness   HPI: Michelle Buck is a 81 y.o. female with medical history significant for peripheral arterial disease, diastolic heart failure, hypertension, diabetes with neuropathy, morbid obesity, COPD, 2 L oxygen dependent, OSA non compliant with , CK D stage III, presenting to the emergency department after falling in the bathroom without hitting her head, or losing consciousness. Patient has been increasingly debilitated over the last few days. In addition, her brother reports that she has been " shaking with jerking motions" . The patient does not identify any areas of pain. She denies any shortness of breath, fever or new productive cough . She reports a tremor that begun about one day ago, without focal weakness or numbness. On presentation, she was moderately confused, but these status has been improving since admission. She denies any abdominal pain dysuria or gross hematuria. She denies any constipation at this time. She denies any worsening leg swelling. Pain. She denies any tobacco alcohol or recreational drug use.    ED Course:  BP 120/69   Pulse 75   Temp 98.8 F (37.1 C) (Oral)   Resp 15   Ht 5\' 2"  (1.575 m)   Wt 99.8 kg (220 lb)   SpO2 96%   BMI 40.24 kg/m    lipase 17 Ammonia 29  urinalysis negative UDS negative except for opiates Troponin 0.02  sodium 140 potassium 4  bilirubin 0.6 bicarb 27 creatinine 2.68 GFR 18 calcium 9.2 WBC 7.8 hemoglobin 10 platelets 271 CT of the head with chronic small vessel ischemic injury in the deep white matter and basal ganglia without evidence of hemorrhage chest x-ray with mild vascular congestion last 2-D echo 09/25/2016 showed EF 60 to 76%, normal systolic function, grade 2 diastolic dysfunction  Review of Systems:  As per HPI otherwise all other systems  reviewed and are negative  Past Medical History:  Diagnosis Date  . Acute bronchitis   . Anxiety   . Arthritis   . Chronic kidney disease, stage II (mild)   . Complications affecting other specified body systems, hypertension   . Congestive heart failure, unspecified    stress test in March at [facility]  . COPD (chronic obstructive pulmonary disease) ([place])    present for several years, diagnosed in the last few years  . Degenerative arthritis of knee, bilateral   . Diabetes mellitus   . Diaphragmatic hernia without mention of obstruction or gangrene   . Disorder of bone and cartilage, unspecified   . GERD (gastroesophageal reflux disease)   . Hiatal hernia   . Hyperlipidemia   . Hypertension   . Hypopotassemia   . Insomnia, unspecified   . Lumbago   . Obese   . Osteoarthrosis, unspecified whether generalized or localized, unspecified site   . PONV (postoperative nausea and vomiting)   . Scoliosis   . Secondary diabetes mellitus with renal manifestations, not stated as uncontrolled, or unspecified(249.40)   . Unspecified glaucoma(365.9)   . Unspecified hereditary and idiopathic peripheral neuropathy     Past Surgical History:  Procedure Laterality Date  . ABDOMINAL HYSTERECTOMY    . COLONOSCOPY  08/06/2011  . FOOT SURGERY    . KNEE ARTHROSCOPY     bilateral  . SHOULDER SURGERY    . TOTAL HIP ARTHROPLASTY     bilateral    Social  History Social History   Social History  . Marital status: Widowed    Spouse name: N/A  . Number of children: N/A  . Years of education: N/A   Occupational History  . Retired Unemployed   Social History Main Topics  . Smoking status: Former Smoker    Packs/day: 0.50    Years: 3.00    Types: Cigarettes    Quit date: 02/27/1997  . Smokeless tobacco: Never Used     Comment: remote history - stopped 19 years ago; 20 pack year history  . Alcohol use No  . Drug use: No  . Sexual activity: No   Other Topics Concern  . Not on  file   Social History Narrative   Widowed   No children   Never held a steady job     Allergies  Allergen Reactions  . Tramadol Other (See Comments)    Leg cramps   . Codeine Nausea And Vomiting    Patient states N/V with codeine  . Hydrocodone Nausea And Vomiting  . Penicillins Rash    Patient states rash/itch with penicillin Has patient had a PCN reaction causing immediate rash, facial/tongue/throat swelling, SOB or lightheadedness with hypotension: Yes- broke me out and i was itching Has patient had a PCN reaction causing severe rash involving mucus membranes or skin necrosis: No Has patient had a PCN reaction that required hospitalization Yes- i was already in the hospital Has patient had a PCN reaction occurring within the last 10 years: No If all of the above answers are "NO  . Tylenol [Acetaminophen] Hives    Family History  Problem Relation Age of Onset  . Diabetes Mother   . Heart disease Mother   . Heart disease Brother   . Diabetes Brother   . Kidney disease Father   . Scoliosis Sister   . Diabetes Brother   . Diabetes Brother   . Heart disease Brother   . Scoliosis Brother   . Stroke Brother   . Heart attack Brother       Prior to Admission medications   Medication Sig Start Date End Date Taking? Authorizing Provider  albuterol (PROVENTIL HFA;VENTOLIN HFA) 108 (90 Base) MCG/ACT inhaler Inhale 2 puffs into the lungs every 4 (four) hours as needed for wheezing or shortness of breath.   Yes [provider]  aspirin EC 81 MG tablet Take 81 mg by mouth daily.   Yes [provider]  budesonide (PULMICORT) 0.25 MG/2ML nebulizer solution Take one ampule in Neb twice daily routinely for COPD  J44.1 Patient taking differently: Take 0.25 mg by nebulization 2 (two) times daily. Take one ampule in Neb twice daily routinely for COPD  J44.1 11/24/16  Yes [name], NP  Cholecalciferol (VITAMIN D3) 2000 units TABS Take 2,000 Units by mouth  daily.   Yes [provider]  cilostazol (PLETAL) 100 MG tablet Take 1 tablet (100 mg total) by mouth daily. 04/16/17  Yes [name], NP  DULoxetine (CYMBALTA) 30 MG capsule Take one capsule by mouth once daily to help anxiety and pains 04/16/17  Yes [name], NP  esomeprazole (NEXIUM) 40 MG capsule Take 1 capsule (40 mg total) by mouth daily. 04/16/17  Yes [name], NP  furosemide (LASIX) 40 MG tablet TAKE 1 TABLET BY MOUTH TWICE A DAY 02/08/17  Yes [name], NP  gabapentin (NEURONTIN) 100 MG capsule Take two capsules by mouth three times daily 04/16/17  Yes Eubanks, Carlos American,  NP  guaifenesin (ROBITUSSIN) 100 MG/5ML syrup Take 200 mg by mouth 3 (three) times daily as needed for cough.   Yes [provider]  hydrALAZINE (APRESOLINE) 50 MG tablet Take 1 tablet (50 mg total) by mouth 3 (three) times daily. 04/16/17  Yes [name], NP  HYDROcodone-acetaminophen (NORCO) 10-325 MG tablet Take 1 tablet by mouth every 6 (six) hours as needed. Patient taking differently: Take 1 tablet by mouth every 6 (six) hours as needed for moderate pain.  04/28/17  Yes Eulas Post, Monica, DO  insulin aspart protamine- aspart (NOVOLOG MIX 70/30) (70-30) 100 UNIT/ML injection Inject 40 units subcutaneously with breakfast and inject 25 units subcutaneously with dinner to control blood sugar 04/20/17  Yes [name], NP  Insulin Syringe-Needle U-100 (EASY TOUCH INSULIN SYRINGE) 31G X 5/16" 0.5 ML MISC USE UP TO 2 TIMES A DAY AS DIRECTED. 04/16/17  Yes [name], NP  ipratropium-albuterol (DUONEB) 0.5-2.5 (3) MG/3ML SOLN Take 66mls by nebulization every 6 hours as needed for SOB/Wheezing 01/09/16  Yes [name], NP  linaclotide (LINZESS) 145 MCG CAPS capsule Take 1 capsule (145 mcg total) by mouth 2 (two) times daily. 04/20/17  Yes [name], NP  lubiprostone (AMITIZA) 24 MCG capsule Take one capsule by mouth twice daily with meals 04/16/17  Yes  [name], NP  metoprolol succinate (TOPROL-XL) 25 MG 24 hr tablet Take one tablet by mouth once daily with or immediately following a meal for blood pressure 04/16/17  Yes [name], NP  ondansetron (ZOFRAN ODT) 8 MG disintegrating tablet Take 1 tablet (8 mg total) by mouth every 8 (eight) hours as needed for nausea or vomiting. 05/03/17  Yes Lajean Saver, MD  oxyCODONE-acetaminophen (PERCOCET/ROXICET) 5-325 MG tablet Take 1 tablet by mouth every 6 (six) hours as needed for severe pain. 05/03/17  Yes Lajean Saver, MD  potassium chloride SA (K-DUR,KLOR-CON) 20 MEQ tablet Take 1 tablet (20 mEq total) by mouth daily. 04/16/17  Yes [name], NP  sertraline (ZOLOFT) 50 MG tablet Take 1 tablet (50 mg total) by mouth daily. 04/16/17  Yes [name], NP  simvastatin (ZOCOR) 20 MG tablet Take 1 tablet (20 mg total) by mouth daily. 04/16/17  Yes [name], NP  sucralfate (CARAFATE) 1 g tablet Take one tablet by mouth twice daily on empty stomach 04/16/17  Yes [name], NP  tamsulosin (FLOMAX) 0.4 MG CAPS capsule Take 1 capsule (0.4 mg total) by mouth daily. 05/03/17  Yes Lajean Saver, MD  timolol (TIMOPTIC) 0.5 % ophthalmic solution INSTILL 1 DROP INTO EACH EYE TWICE A DAY 02/12/16  Yes Eubanks, Carlos American, NP  TRAVATAN Z 0.004 % SOLN ophthalmic solution Place 1 drop into both eyes at bedtime.  04/09/16  Yes [provider]  diclofenac sodium (VOLTAREN) 1 % GEL APPLY 4 GRAM TOPICALLY TWICE DAILY as needed for pain Patient not taking: Reported on 05/06/2017 06/08/16   [name], NP    Physical Exam:  Vitals:   05/06/17 1030 05/06/17 1033 05/06/17 1125 05/06/17 1130  BP: (!) 128/102 (!) 128/102  120/69  Pulse: 85 79 78 75  Resp: 18 20 18 15   Temp:      TempSrc:      SpO2: 96% 98% 100% 96%  Weight:      Height:       Constitutional: NAD, calm, comfortable   Eyes: PERRL, lids and conjunctivae normal ENMT: Mucous membranes are moist, without  exudate  or lesions  Neck: normal, supple, no masses, no thyromegaly Respiratory:  no wheezing, no crackles. Bilateral rhonchi, L>R  Normal respiratory effort  Cardiovascular: Regular rate and rhythm, 2/6  murmurs, rubs or gallops. No extremity edema. 2+ pedal pulses. No carotid bruits.  Abdomen:  Obese, Soft, non tender, No hepatosplenomegaly. Bowel sounds positive. No CVAT Musculoskeletal: no clubbing / cyanosis. Moves all extremities Skin: no jaundice, No lesions.  Neurologic: Sensation intact  Strength equal in all extremities. Bilateral intention tremor  Psychiatric:   Alert and oriented x 3. Mildly lethargic  mood.     Labs on Admission: I have personally reviewed following labs and imaging studies  CBC:  Recent Labs Lab 05/03/17 1204 05/06/17 0831  WBC 7.5 7.8  NEUTROABS  --  6.1  HGB 10.3* 10.0*  HCT 32.0* 33.0*  MCV 79.2 82.5  PLT 286 384    Basic Metabolic Panel:  Recent Labs Lab 05/03/17 1204 05/06/17 0831  NA 140 140  K 3.4* 4.0  CL 104 101  CO2 27 27  GLUCOSE 98 201*  BUN 23* 23*  CREATININE 2.35* 2.68*  CALCIUM 9.0 9.2    GFR: Estimated Creatinine Clearance: 19.1 mL/min (A) (by C-G formula based on SCr of 2.68 mg/dL (H)).  Liver Function Tests:  Recent Labs Lab 05/03/17 1204 05/06/17 0831  AST 17 18  ALT 8* 12*  ALKPHOS 88 93  BILITOT 0.3 0.6  PROT 7.3 7.0  ALBUMIN 3.5 3.4*    Recent Labs Lab 05/03/17 1204 05/06/17 0831  LIPASE 19 17    Recent Labs Lab 05/06/17 0831  AMMONIA 29    Coagulation Profile: No results for input(s): INR, PROTIME in the last 168 hours.  Cardiac Enzymes: No results for input(s): CKTOTAL, CKMB, CKMBINDEX, TROPONINI in the last 168 hours.  BNP (last 3 results) No results for input(s): PROBNP in the last 8760 hours.  HbA1C: No results for input(s): HGBA1C in the last 72 hours.  CBG:  Recent Labs Lab 05/03/17 1313 05/03/17 1340  GLUCAP 46* 104*    Lipid Profile: No results for input(s):  CHOL, HDL, LDLCALC, TRIG, CHOLHDL, LDLDIRECT in the last 72 hours.  Thyroid Function Tests: No results for input(s): TSH, T4TOTAL, FREET4, T3FREE, THYROIDAB in the last 72 hours.  Anemia Panel: No results for input(s): VITAMINB12, FOLATE, FERRITIN, TIBC, IRON, RETICCTPCT in the last 72 hours.  Urine analysis:    Component Value Date/Time   COLORURINE STRAW (A) 05/06/2017 1033   APPEARANCEUR CLEAR 05/06/2017 1033   LABSPEC 1.010 05/06/2017 1033   PHURINE 5.0 05/06/2017 1033   GLUCOSEU NEGATIVE 05/06/2017 1033   HGBUR NEGATIVE 05/06/2017 1033   BILIRUBINUR NEGATIVE 05/06/2017 1033   KETONESUR NEGATIVE 05/06/2017 1033   PROTEINUR NEGATIVE 05/06/2017 1033   UROBILINOGEN 0.2 02/14/2008 0947   NITRITE NEGATIVE 05/06/2017 1033   LEUKOCYTESUR NEGATIVE 05/06/2017 1033    Sepsis Labs: @LABRCNTIP (procalcitonin:4,lacticidven:4) )No results found for this or any previous visit (from the past 240 hour(s)).   Radiological Exams on Admission: Ct Head Wo Contrast  Result Date: 05/06/2017 CLINICAL DATA:  Fall getting out of bed this morning. Weakness for 3 days. EXAM: CT HEAD WITHOUT CONTRAST TECHNIQUE: Contiguous axial images were obtained from the base of the skull through the vertex without intravenous contrast. COMPARISON:  None available FINDINGS: Brain: No evidence of acute cortical infarction, hemorrhage, hydrocephalus, extra-axial collection or mass lesion/mass effect. Bilateral patchy low-density in the deep cerebral white matter tracts with indistinct deep gray nuclei, likely primarily chronic but superimposed infarct  could be obscured. Vascular: No hyperdense vessel.  No unexpected calcification. Skull: No acute finding. Sinuses/Orbits: Bilateral cataract resection. Sequela of resolved left mastoiditis. IMPRESSION: 1. No definite acute finding. 2. Chronic small vessel ischemic injury in the deep white matter and basal ganglia. If there is a focal deficit, note that these changes could obscure  acute ischemia. Electronically Signed   By: Monte Fantasia M.D.   On: 05/06/2017 09:39   Dg Chest Port 1 View  Result Date: 05/06/2017 CLINICAL DATA:  Recent fall, shortness of breath and cough EXAM: PORTABLE CHEST 1 VIEW COMPARISON:  05/03/2017 FINDINGS: Cardiac shadow remains enlarged. The lungs are well aerated bilaterally. Vascular congestion is seen. Mild interstitial changes are noted. No focal confluent infiltrate is seen. IMPRESSION: Mild vascular congestion. Electronically Signed   By: Inez Catalina M.D.   On: 05/06/2017 08:31    EKG: Independently reviewed.  Assessment/Plan Active Problems:   Diabetes mellitus type 2 in obese [facility])   Essential hypertension, benign   Peripheral autonomic neuropathy due to diabetes mellitus ([facility])   Chronic kidney disease (CKD), stage IV (severe) (HCC)   Anemia of chronic kidney failure   Chronic obstructive pulmonary disease (HCC)   PAD (peripheral artery disease) (H[facility])   Glaucoma   OSA (obstructive sleep apnea)   Diastolic CHF (HCC)   Osteoarthritis   Abnormality of gait   Generalized weakness and mild confusion likely due to hypercapnia. H/o chronic respiratory failure O2 dependent and with OSA, not currently wearing her CPAP on a regular basis, which contributed to her hypercapnia .Currently, her status is improving, her mild jerking events are less pronoounced  Chest x-ray with mild vascular congestion. White count normal at 7.8. Ammonia 29. Bili 0.6 Urinalysis and UDS negative. Bicarb 27. Calcium is normal at 9.2. CT of the head without acute changes, or evidence of hemorrhage. Receiving gentle IVF at 100 cc/h  Admit for telemetry observation Oxygen at 2 L  IV fluids  CMET and CBC in a.m. Incentive spirometry  PT/OT  CPAP at night. Patient has been instructed dosed the importance of wearing this on a regular basis Continue home nebs   Recent diagnosis of nephrolithiasis per CT abdomen and pelvis 6/4 . No dysuria at this time, pain is  better control, urinalysis negative. White count normal. Receiving IV fluids. Continue to monitor, no other acute intervention is indicated at this time.  Type II Diabetes Current blood sugar level is 201, A1C 6.8  Lab Results  Component Value Date   HGBA1C 6.8 (H) 03/15/2017   Hold home oral diabetic medications.   SSI Continue Neurontin  Diastolic CHF: No acute decompensation weight 220 lb Continue BB, Lasix when BP improves  daily weights Monitor intake and output  Anemia of chronic disease Hemoglobin on admission 10 at baseline .   Repeat CBC in am  No transfusion is indicated at this time   Depression Continue home  Cymbalta and Zoloft   Hypertension BP 110/61   Pulse 75     Continue home anti-hypertensive medications in am    GERD, no acute symptoms Continue PPI  Peripheral arterial disease Continue Pletal   Glaucoma Continue eyedrops    DVT prophylaxis: Heparin   Code Status:   Full      Family Communication:  Discussed with patient Disposition Plan: Expect patient to be discharged to home after condition improves Consults called:   none  Admission status:Tele  Obs   Kamarri Lovvorn E, PA-C [facility]   05/06/2017, 12:00 PM

## 2017-05-06 NOTE — ED Notes (Signed)
Family at bedside. 

## 2017-05-06 NOTE — ED Notes (Signed)
Pt is much more alert and talking at this time, pts breathing has improved significantly, pt has easy non labored respirations.

## 2017-05-06 NOTE — Progress Notes (Addendum)
Paged Md for pain Medication Per Family pt has tolerated Hydrocodone with no problems and ordered K-Pad for chronic Left Knee pain. Plan for Pt/OT to Evaluate tomorrow d/t fall.  [place] [facility] currently in th home and has Benefited from [facility] in the past.

## 2017-05-06 NOTE — ED Notes (Signed)
Pt placed on bedpan, urine spilled out of pan and onto bed, sheets changed and patient clean, unable to collect sample at this time.

## 2017-05-06 NOTE — ED Notes (Signed)
Pt transported to CT ?

## 2017-05-06 NOTE — ED Triage Notes (Addendum)
Pt comes from home by [facility] where she lives at home alone but son has been staying some nights and has a day time caretaker. Pt states for the last week she has felt more sob and having a cough with thick sputum. Pt has audible wheeze present on expiration. Pt was given 125 soul medrol and 5mg  albuterol en route to hospital. Pt got out of bed this am and upon staying both legs "felt weak" and pt fell trying to hold onto her walker, pts son was at the house but unable to get her up so called EMS. On arrival ems noticed pt was lending to right, son states she has been doing this for the  "last 3 days". Pt states she legs have been feeling weak but denies any previous fall. Pt is alert and ox4. Pt was bilateral weakness worse on right. Pt has "jerking" motions in both arms, pt states this is and ongoing issue.

## 2017-05-07 ENCOUNTER — Observation Stay (HOSPITAL_COMMUNITY): Payer: Medicare HMO

## 2017-05-07 ENCOUNTER — Ambulatory Visit: Payer: Medicare Other | Admitting: Internal Medicine

## 2017-05-07 DIAGNOSIS — J449 Chronic obstructive pulmonary disease, unspecified: Secondary | ICD-10-CM

## 2017-05-07 DIAGNOSIS — J9622 Acute and chronic respiratory failure with hypercapnia: Secondary | ICD-10-CM | POA: Diagnosis not present

## 2017-05-07 DIAGNOSIS — E669 Obesity, unspecified: Secondary | ICD-10-CM

## 2017-05-07 DIAGNOSIS — R0989 Other specified symptoms and signs involving the circulatory and respiratory systems: Secondary | ICD-10-CM | POA: Diagnosis not present

## 2017-05-07 DIAGNOSIS — E1169 Type 2 diabetes mellitus with other specified complication: Secondary | ICD-10-CM

## 2017-05-07 DIAGNOSIS — R269 Unspecified abnormalities of gait and mobility: Secondary | ICD-10-CM | POA: Diagnosis not present

## 2017-05-07 DIAGNOSIS — N183 Chronic kidney disease, stage 3 (moderate): Secondary | ICD-10-CM

## 2017-05-07 DIAGNOSIS — R410 Disorientation, unspecified: Secondary | ICD-10-CM | POA: Diagnosis not present

## 2017-05-07 DIAGNOSIS — R938 Abnormal findings on diagnostic imaging of other specified body structures: Secondary | ICD-10-CM | POA: Diagnosis not present

## 2017-05-07 DIAGNOSIS — D631 Anemia in chronic kidney disease: Secondary | ICD-10-CM

## 2017-05-07 LAB — CBC
HCT: 29.7 % — ABNORMAL LOW (ref 36.0–46.0)
HEMOGLOBIN: 9.1 g/dL — AB (ref 12.0–15.0)
MCH: 25.1 pg — AB (ref 26.0–34.0)
MCHC: 30.6 g/dL (ref 30.0–36.0)
MCV: 82 fL (ref 78.0–100.0)
Platelets: 261 10*3/uL (ref 150–400)
RBC: 3.62 MIL/uL — AB (ref 3.87–5.11)
RDW: 16.5 % — ABNORMAL HIGH (ref 11.5–15.5)
WBC: 8.1 10*3/uL (ref 4.0–10.5)

## 2017-05-07 LAB — COMPREHENSIVE METABOLIC PANEL
ALK PHOS: 78 U/L (ref 38–126)
ALT: 10 U/L — ABNORMAL LOW (ref 14–54)
ANION GAP: 10 (ref 5–15)
AST: 14 U/L — ABNORMAL LOW (ref 15–41)
Albumin: 3.1 g/dL — ABNORMAL LOW (ref 3.5–5.0)
BUN: 26 mg/dL — ABNORMAL HIGH (ref 6–20)
CALCIUM: 9.1 mg/dL (ref 8.9–10.3)
CHLORIDE: 98 mmol/L — AB (ref 101–111)
CO2: 31 mmol/L (ref 22–32)
Creatinine, Ser: 2.6 mg/dL — ABNORMAL HIGH (ref 0.44–1.00)
GFR calc Af Amer: 19 mL/min — ABNORMAL LOW (ref >60)
GFR calc non Af Amer: 17 mL/min — ABNORMAL LOW (ref >60)
Glucose, Bld: 83 mg/dL (ref 65–99)
Potassium: 3.5 mmol/L (ref 3.5–5.1)
Sodium: 139 mmol/L (ref 135–145)
Total Bilirubin: 0.6 mg/dL (ref 0.3–1.2)
Total Protein: 6.5 g/dL (ref 6.5–8.1)

## 2017-05-07 LAB — URINE CULTURE: CULTURE: NO GROWTH

## 2017-05-07 LAB — GLUCOSE, CAPILLARY
Glucose-Capillary: 79 mg/dL (ref 65–99)
Glucose-Capillary: 229 mg/dL — ABNORMAL HIGH (ref 65–99)
Glucose-Capillary: 181 mg/dL — ABNORMAL HIGH (ref 65–99)

## 2017-05-07 MED ORDER — POTASSIUM CHLORIDE CRYS ER 20 MEQ PO TBCR
40.0000 meq | EXTENDED_RELEASE_TABLET | Freq: Once | ORAL | Status: AC
  Administered 2017-05-07: 40 meq via ORAL
  Filled 2017-05-07: qty 2

## 2017-05-07 MED ORDER — FUROSEMIDE 10 MG/ML IJ SOLN
60.0000 mg | Freq: Once | INTRAMUSCULAR | Status: AC
  Administered 2017-05-07: 60 mg via INTRAVENOUS
  Filled 2017-05-07: qty 6

## 2017-05-07 NOTE — Discharge Summary (Signed)
Physician Discharge Summary  Trixy Loyola GYB:[id number] DOB: 12-20-1935 DOA: 05/06/2017  PCP: [name], NP  Admit date: 05/06/2017 Discharge date: 05/07/2017  Admitted From: Home Disposition: Home  Recommendations for Outpatient Follow-up:  1. Follow up with PCP in 1-2 weeks 2. Please obtain BMP/CBC in one week  Home Health: NA Equipment/Devices:NA  Discharge Condition: Stable CODE STATUS: Full Code Diet recommendation: Diet renal/carb modified with fluid restriction Diet-HS Snack? Nothing; Room service appropriate? Yes; Fluid consistency: Thin Diet - low sodium heart healthy  Brief/Interim Summary: Michelle Buck is a 81 y.o. female with medical history significant for peripheral arterial disease, diastolic heart failure, hypertension, diabetes with neuropathy, morbid obesity, COPD, 2 L oxygen dependent, OSA non compliant with , CK D stage III, presenting to the emergency department after falling in the bathroom without hitting her head, or losing consciousness. Patient has been increasingly debilitated over the last few days. In addition, her brother reports that she has been " shaking with jerking motions" . The patient does not identify any areas of pain. She denies any shortness of breath, fever or new productive cough . She reports a tremor that begun about one day ago, without focal weakness or numbness. On presentation, she was moderately confused, but these status has been improving since admission. She denies any abdominal pain dysuria or gross hematuria. She denies any constipation at this time. She denies any worsening leg swelling. Pain. She denies any tobacco alcohol or recreational drug use.   Discharge Diagnoses:  Active Problems:   Diabetes mellitus type 2 in obese [facility])   Essential hypertension, benign   Peripheral autonomic neuropathy due to diabetes mellitus (HCC)   Chronic kidney disease (CKD), stage IV (severe) (HCC)   Anemia of chronic kidney failure   Chronic  obstructive pulmonary disease (HCC)   PAD (peripheral artery disease) (HCC)   Glaucoma   OSA (obstructive sleep apnea)   Diastolic CHF (HCC)   Osteoarthritis   Abnormality of gait   Hypercapnia   Generalized weakness and mild confusion likely due to hypercapnia.  -History of chronic respiratory failure with hypoxia, on 2 L of oxygen continuously at home. -ABG showed respiratory acidosis with hypercapnia, mild with PCO2 of 60. -Patient reported she took opioid pain medications for recent pain secondary to renal stone. -Not compliant with CPAP which is probably contributing to her mild hypercapnia. -Monitored in the hospital, CPAP at night, she feels okay this morning. -Although her CXR showed vascular congestion she is asymptomatic, baseline, extra dose of Lasix 60 mg IV given this AM -Discharged home to follow-up with primary care physician.  Recent diagnosis of nephrolithiasis per CT abdomen and pelvis 6/4 . No dysuria at this time, pain is better control, urinalysis negative. White count normal.  Continue to monitor, no other acute intervention is indicated at this time, follow-up with urology as outpatient.  Type II Diabetes Current blood sugar level is 201, A1C 6.8  -Controlled diabetes mellitus with A1c of 6.8. -Medications restarted on discharge including Neurontin.  Diastolic CHF: No acute decompensation weight 220 lb Continue BB, Lasix when BP improves  Given IV fluids, so extra dose of Lasix given prior to discharge.  Anemia of chronic disease Hemoglobin on admission 10 at baseline .   Repeat CBC in am  No transfusion is indicated at this time  Depression Continue home  Cymbalta and Zoloft  Hypertension BP 110/61   Pulse 75     Continue home anti-hypertensive medications in am    GERD, no  acute symptoms Continue PPI  Peripheral arterial disease Continue Pletal   Glaucoma Continue eyedrops    Discharge Instructions  Discharge Instructions    Diet  - low sodium heart healthy    Complete by:  As directed    Increase activity slowly    Complete by:  As directed      Allergies as of 05/07/2017      Reactions   Tramadol Other (See Comments)   Leg cramps    Codeine Nausea And Vomiting   Patient states N/V with codeine   Hydrocodone Nausea And Vomiting   Penicillins Rash   Patient states rash/itch with penicillin Has patient had a PCN reaction causing immediate rash, facial/tongue/throat swelling, SOB or lightheadedness with hypotension: Yes- broke me out and i was itching Has patient had a PCN reaction causing severe rash involving mucus membranes or skin necrosis: No Has patient had a PCN reaction that required hospitalization Yes- i was already in the hospital Has patient had a PCN reaction occurring within the last 10 years: No If all of the above answers are "NO   Tylenol [acetaminophen] Hives      Medication List    STOP taking these medications   diclofenac sodium 1 % Gel Commonly known as:  VOLTAREN   HYDROcodone-acetaminophen 10-325 MG tablet Commonly known as:  NORCO     TAKE these medications   albuterol 108 (90 Base) MCG/ACT inhaler Commonly known as:  PROVENTIL HFA;VENTOLIN HFA Inhale 2 puffs into the lungs every 4 (four) hours as needed for wheezing or shortness of breath.   aspirin EC 81 MG tablet Take 81 mg by mouth daily.   budesonide 0.25 MG/2ML nebulizer solution Commonly known as:  PULMICORT Take one ampule in Neb twice daily routinely for COPD  J44.1 What changed:  how much to take  how to take this  when to take this  additional instructions   cilostazol 100 MG tablet Commonly known as:  PLETAL Take 1 tablet (100 mg total) by mouth daily.   DULoxetine 30 MG capsule Commonly known as:  CYMBALTA Take one capsule by mouth once daily to help anxiety and pains   esomeprazole 40 MG capsule Commonly known as:  NEXIUM Take 1 capsule (40 mg total) by mouth daily.   furosemide 40 MG  tablet Commonly known as:  LASIX TAKE 1 TABLET BY MOUTH TWICE A DAY   gabapentin 100 MG capsule Commonly known as:  NEURONTIN Take two capsules by mouth three times daily   guaifenesin 100 MG/5ML syrup Commonly known as:  ROBITUSSIN Take 200 mg by mouth 3 (three) times daily as needed for cough.   hydrALAZINE 50 MG tablet Commonly known as:  APRESOLINE Take 1 tablet (50 mg total) by mouth 3 (three) times daily.   insulin aspart protamine- aspart (70-30) 100 UNIT/ML injection Commonly known as:  NOVOLOG MIX 70/30 Inject 40 units subcutaneously with breakfast and inject 25 units subcutaneously with dinner to control blood sugar   Insulin Syringe-Needle U-100 31G X 5/16" 0.5 ML Misc Commonly known as:  EASY TOUCH INSULIN SYRINGE USE UP TO 2 TIMES A DAY AS DIRECTED.   ipratropium-albuterol 0.5-2.5 (3) MG/3ML Soln Commonly known as:  DUONEB Take 44mls by nebulization every 6 hours as needed for SOB/Wheezing   linaclotide 145 MCG Caps capsule Commonly known as:  LINZESS Take 1 capsule (145 mcg total) by mouth 2 (two) times daily.   lubiprostone 24 MCG capsule Commonly known as:  AMITIZA Take one capsule by  mouth twice daily with meals   metoprolol succinate 25 MG 24 hr tablet Commonly known as:  TOPROL-XL Take one tablet by mouth once daily with or immediately following a meal for blood pressure   ondansetron 8 MG disintegrating tablet Commonly known as:  ZOFRAN ODT Take 1 tablet (8 mg total) by mouth every 8 (eight) hours as needed for nausea or vomiting.   oxyCODONE-acetaminophen 5-325 MG tablet Commonly known as:  PERCOCET/ROXICET Take 1 tablet by mouth every 6 (six) hours as needed for severe pain.   potassium chloride SA 20 MEQ tablet Commonly known as:  K-DUR,KLOR-CON Take 1 tablet (20 mEq total) by mouth daily.   sertraline 50 MG tablet Commonly known as:  ZOLOFT Take 1 tablet (50 mg total) by mouth daily.   simvastatin 20 MG tablet Commonly known as:   ZOCOR Take 1 tablet (20 mg total) by mouth daily.   sucralfate 1 g tablet Commonly known as:  CARAFATE Take one tablet by mouth twice daily on empty stomach   tamsulosin 0.4 MG Caps capsule Commonly known as:  FLOMAX Take 1 capsule (0.4 mg total) by mouth daily.   timolol 0.5 % ophthalmic solution Commonly known as:  TIMOPTIC INSTILL 1 DROP INTO EACH EYE TWICE A DAY   TRAVATAN Z 0.004 % Soln ophthalmic solution Generic drug:  Travoprost (BAK Free) Place 1 drop into both eyes at bedtime.   Vitamin D3 2000 units Tabs Take 2,000 Units by mouth daily.       Allergies  Allergen Reactions  . Tramadol Other (See Comments)    Leg cramps   . Codeine Nausea And Vomiting    Patient states N/V with codeine  . Hydrocodone Nausea And Vomiting  . Penicillins Rash    Patient states rash/itch with penicillin Has patient had a PCN reaction causing immediate rash, facial/tongue/throat swelling, SOB or lightheadedness with hypotension: Yes- broke me out and i was itching Has patient had a PCN reaction causing severe rash involving mucus membranes or skin necrosis: No Has patient had a PCN reaction that required hospitalization Yes- i was already in the hospital Has patient had a PCN reaction occurring within the last 10 years: No If all of the above answers are "NO  . Tylenol [Acetaminophen] Hives    Consultations:  None  Procedures (Echo, Carotid, EGD, Colonoscopy, ERCP)   Radiological studies: Ct Abdomen Pelvis Wo Contrast  Result Date: 05/03/2017 CLINICAL DATA:  Abdominal pain with associated nausea and watery stool. EXAM: CT ABDOMEN AND PELVIS WITHOUT CONTRAST TECHNIQUE: Multidetector CT imaging of the abdomen and pelvis was performed following the standard protocol without IV contrast. COMPARISON:  CT 09/01/2011 FINDINGS: Lower chest: Mild bibasilar linear pleural-parenchymal thickening. Hepatobiliary: No focal hepatic lesion on noncontrast exam. Normal gallbladder. Pancreas:  Pancreas is normal. No ductal dilatation. No pancreatic inflammation. Spleen: Enlargement of the LEFT adrenal is low-attenuation consists with benign adenoma. RIGHT adrenal glands normal. 2 large calculus within the RIGHT kidney measuring 17 mm and 6 mm. These calculi are nonobstructive. No perinephric stranding and mild hydroureter of the RIGHT. Potential obstructing calculus in the distal RIGHT ureter measures 6 mm (image 72, series 2). Difficult to confidently localized this calculus within the ureter as there is streak artifact from the bilateral hip prosthetics. Tiny nonobstructing calculus in the LEFT kidney measures 4 mm. No obstructive uropathy on the LEFT. No ureteral calculi identified on the LEFT. Adrenals/urinary tract: Adrenal glands and kidneys are normal. The ureters and bladder normal. Stomach/Bowel: Large hiatal hernia.  Duodenum small bowel are normal. Terminal ileum is normal. Appendix is not identified. No secondary signs of appendicitis. Multiple diverticula through the sigmoid region with no acute diverticulitis. Vascular/Lymphatic: Abdominal aorta is normal caliber with atherosclerotic calcification. There is no retroperitoneal or periportal lymphadenopathy. No pelvic lymphadenopathy. Reproductive: Post hysterectomy. Pelvic floor poorly evaluated due to streak artifact from hip prosthetics. Other: No free fluid. Musculoskeletal: No aggressive osseous lesion. IMPRESSION: 1. Perinephric stranding and mild hydroureter on the RIGHT with potential distal ureteral calculus on the RIGHT. Difficult to define the location due to streak artifact from the hip prosthetics. IV contrast CT with delays made further localize this calculus. 2. Large renal calculi on the RIGHT similar to prior. Small nonobstructing LEFT renal calculus. 3. Appendix not identified but there are no secondary signs of appendicitis. 4. Large hiatal hernia. Electronically Signed   By: Suzy Bouchard M.D.   On: 05/03/2017 16:45    Ct Head Wo Contrast  Result Date: 05/06/2017 CLINICAL DATA:  Fall getting out of bed this morning. Weakness for 3 days. EXAM: CT HEAD WITHOUT CONTRAST TECHNIQUE: Contiguous axial images were obtained from the base of the skull through the vertex without intravenous contrast. COMPARISON:  None available FINDINGS: Brain: No evidence of acute cortical infarction, hemorrhage, hydrocephalus, extra-axial collection or mass lesion/mass effect. Bilateral patchy low-density in the deep cerebral white matter tracts with indistinct deep gray nuclei, likely primarily chronic but superimposed infarct could be obscured. Vascular: No hyperdense vessel.  No unexpected calcification. Skull: No acute finding. Sinuses/Orbits: Bilateral cataract resection. Sequela of resolved left mastoiditis. IMPRESSION: 1. No definite acute finding. 2. Chronic small vessel ischemic injury in the deep white matter and basal ganglia. If there is a focal deficit, note that these changes could obscure acute ischemia. Electronically Signed   By: Monte Fantasia M.D.   On: 05/06/2017 09:39   Dg Chest Port 1 View  Result Date: 05/07/2017 CLINICAL DATA:  Abnormal chest x-ray EXAM: PORTABLE CHEST 1 VIEW COMPARISON:  05/06/2017 FINDINGS: Stable cardiomegaly. Stable vascular congestion. No Kerley B lines. Thickening of the right minor fissure may indicate a small amount of pleural fluid or subsegmental atelectasis in the adjacent lung. No pneumothorax. Degenerative change of the glenohumeral joints. IMPRESSION: Stable vascular congestion. Mild right pleural effusion may be present. Electronically Signed   By: Marybelle Killings M.D.   On: 05/07/2017 07:10   Dg Chest Port 1 View  Result Date: 05/06/2017 CLINICAL DATA:  Recent fall, shortness of breath and cough EXAM: PORTABLE CHEST 1 VIEW COMPARISON:  05/03/2017 FINDINGS: Cardiac shadow remains enlarged. The lungs are well aerated bilaterally. Vascular congestion is seen. Mild interstitial changes are  noted. No focal confluent infiltrate is seen. IMPRESSION: Mild vascular congestion. Electronically Signed   By: Inez Catalina M.D.   On: 05/06/2017 08:31   Dg Chest Port 1 View  Result Date: 05/03/2017 CLINICAL DATA:  Nausea and diarrhea.  Abdominal pain EXAM: PORTABLE CHEST 1 VIEW COMPARISON:  October 07, 2016 FINDINGS: No edema or consolidation. Heart is mildly enlarged with pulmonary vascular within normal limits. No adenopathy. There is aortic atherosclerosis. There is extensive arthropathy in both shoulders, more severe on the left than on the right. IMPRESSION: Cardiomegaly.  Aortic atherosclerosis.  No edema or consolidation. Electronically Signed   By: Lowella Grip III M.D.   On: 05/03/2017 13:51     Subjective:  Discharge Exam: Vitals:   05/07/17 0215 05/07/17 0427 05/07/17 0827 05/07/17 1024  BP: 121/63 130/61  99/75  Pulse: 73  70  70  Resp: 17 17  18   Temp: 97.6 F (36.4 C) 97.8 F (36.6 C)  97.9 F (36.6 C)  TempSrc: Oral Oral  Oral  SpO2: 95% 99% 94% 96%  Weight:  96.3 kg (212 lb 6.4 oz)    Height:       General: Pt is alert, awake, not in acute distress Cardiovascular: RRR, S1/S2 +, no rubs, no gallops Respiratory: CTA bilaterally, no wheezing, no rhonchi Abdominal: Soft, NT, ND, bowel sounds + Extremities: no edema, no cyanosis   The results of significant diagnostics from this hospitalization (including imaging, microbiology, ancillary and laboratory) are listed below for reference.    Microbiology: No results found for this or any previous visit (from the past 240 hour(s)).   Labs: BNP (last 3 results)  Recent Labs  05/21/16 1623 09/24/16 1959  BNP 41.5 50.2   Basic Metabolic Panel:  Recent Labs Lab 05/03/17 1204 05/06/17 0831 05/07/17 0342  NA 140 140 139  K 3.4* 4.0 3.5  CL 104 101 98*  CO2 27 27 31   GLUCOSE 98 201* 83  BUN 23* 23* 26*  CREATININE 2.35* 2.68* 2.60*  CALCIUM 9.0 9.2 9.1   Liver Function Tests:  Recent Labs Lab  05/03/17 1204 05/06/17 0831 05/07/17 0342  AST 17 18 14*  ALT 8* 12* 10*  ALKPHOS 88 93 78  BILITOT 0.3 0.6 0.6  PROT 7.3 7.0 6.5  ALBUMIN 3.5 3.4* 3.1*    Recent Labs Lab 05/03/17 1204 05/06/17 0831  LIPASE 19 17    Recent Labs Lab 05/06/17 0831  AMMONIA 29   CBC:  Recent Labs Lab 05/03/17 1204 05/06/17 0831 05/07/17 0342  WBC 7.5 7.8 8.1  NEUTROABS  --  6.1  --   HGB 10.3* 10.0* 9.1*  HCT 32.0* 33.0* 29.7*  MCV 79.2 82.5 82.0  PLT 286 271 261   Cardiac Enzymes: No results for input(s): CKTOTAL, CKMB, CKMBINDEX, TROPONINI in the last 168 hours. BNP: Invalid input(s): POCBNP CBG:  Recent Labs Lab 05/03/17 1340 05/06/17 1331 05/06/17 2227 05/07/17 0416 05/07/17 0918  GLUCAP 104* 209* 326* 79 229*   D-Dimer No results for input(s): DDIMER in the last 72 hours. Hgb A1c No results for input(s): HGBA1C in the last 72 hours. Lipid Profile No results for input(s): CHOL, HDL, LDLCALC, TRIG, CHOLHDL, LDLDIRECT in the last 72 hours. Thyroid function studies No results for input(s): TSH, T4TOTAL, T3FREE, THYROIDAB in the last 72 hours.  Invalid input(s): FREET3 Anemia work up No results for input(s): VITAMINB12, FOLATE, FERRITIN, TIBC, IRON, RETICCTPCT in the last 72 hours. Urinalysis    Component Value Date/Time   COLORURINE STRAW (A) 05/06/2017 1033   APPEARANCEUR CLEAR 05/06/2017 1033   LABSPEC 1.010 05/06/2017 1033   PHURINE 5.0 05/06/2017 1033   GLUCOSEU NEGATIVE 05/06/2017 1033   HGBUR NEGATIVE 05/06/2017 1033   BILIRUBINUR NEGATIVE 05/06/2017 1033   KETONESUR NEGATIVE 05/06/2017 1033   PROTEINUR NEGATIVE 05/06/2017 1033   UROBILINOGEN 0.2 02/14/2008 0947   NITRITE NEGATIVE 05/06/2017 1033   LEUKOCYTESUR NEGATIVE 05/06/2017 1033   Sepsis Labs Invalid input(s): PROCALCITONIN,  WBC,  LACTICIDVEN Microbiology No results found for this or any previous visit (from the past 240 hour(s)).   Time coordinating discharge: Over 30  minutes  SIGNED:   Birdie Hopes, MD  [facility] 05/07/2017, 10:27 AM Pager   If 7PM-7AM, please contact night-coverage [email or web address] Password [email or web address]

## 2017-05-07 NOTE — Progress Notes (Signed)
Discharge instructions given and answered pt's questions. Pt verbalized understanding. IV removed and tolerated well. Pt taken down to lobby via wheelchair for discharge. No s/s of distress at this time.

## 2017-05-07 NOTE — Progress Notes (Signed)
Called MD, got pain med ordered, HS insulin due to high blood sugar. New nurse will be taking over care.

## 2017-05-07 NOTE — Evaluation (Signed)
Physical Therapy Evaluation [name] MRN: [id number] DOB: 1936/01/30 Today's Date: 05/07/2017   History of Present Illness  Michelle Buck is a 81 y.o. female with a Past Medical History significant for peripheral arterial disease, glaucoma, high blood pressure, diabetes who presents with weakness and fall at home.  Clinical Impression  Patient presents with decreased mobility due to general weakness and recent fall.  Relates feels due to medication given for her kidney stones.  Feel safe for d/c home with aide and intermittent family assist.  Feel patient could benefit from follow up [place].  Will follow acutely if not d/c.     Follow Up Recommendations Home health PT    Equipment Recommendations  None recommended by PT    Recommendations for Other Services       Precautions / Restrictions Precautions Precautions: Fall Restrictions Weight Bearing Restrictions: No      Mobility  Bed Mobility               General bed mobility comments: sitting EOB upon my entry  Transfers Overall transfer level: Needs assistance Equipment used: Rolling walker (2 wheeled) Transfers: Sit to/from Stand Sit to Stand: Supervision         General transfer comment: for safety  Ambulation/Gait Ambulation/Gait assistance: Min guard;Supervision Ambulation Distance (Feet): 60 Feet Assistive device: Rolling walker (2 wheeled) Gait Pattern/deviations: Step-through pattern;Decreased stride length;Trunk flexed     General Gait Details: at times reports L LE weakness, but only min c/o this session, remains flexed, but more-so as ambulation progressed and grunting some with some pain behaviors  Stairs            Wheelchair Mobility    Modified Rankin (Stroke Patients Only)       Balance Overall balance assessment: Needs assistance   Sitting balance-Leahy Scale: Good     Standing balance support: Bilateral upper extremity supported Standing balance-Leahy  Scale: Poor Standing balance comment: UE support for balance                             Pertinent Vitals/Pain Pain Assessment: 0-10 Pain Score: 6  Pain Location: L flank Pain Descriptors / Indicators: Aching;Grimacing Pain Intervention(s): Monitored during session    Home Living Family/patient expects to be discharged to:: Private residence Living Arrangements: Alone Available Help at Discharge: Personal care attendant (3.5 hours 5 days a week) Type of Home: Apartment Home Access: Ramped entrance     Home Layout: One level Home Equipment: Youth worker - 4 wheels;Shower seat;Hand held shower head;Grab bars - tub/shower Additional Comments: lift chair    Prior Function Level of Independence: Needs assistance   Gait / Transfers Assistance Needed: uses rollator independently at home  ADL's / Homemaking Assistance Needed: aide comes to assist with bathing/getting into/out of shower        Hand Dominance   Dominant Hand: Right    Extremity/Trunk Assessment   Upper Extremity Assessment Upper Extremity Assessment: Defer to OT evaluation    Lower Extremity Assessment Lower Extremity Assessment: Generalized weakness    Cervical / Trunk Assessment Cervical / Trunk Assessment: Kyphotic;Other exceptions Cervical / Trunk Exceptions: scoliotic  Communication   Communication: No difficulties  Cognition Arousal/Alertness: Awake/alert Behavior During Therapy: WFL for tasks assessed/performed Overall Cognitive Status: Within Functional Limits for tasks assessed  General Comments General comments (skin integrity, edema, etc.): SpO2 93% on 2L O2 at rest, 90% after ambulation on 2L portable O2    Exercises     Assessment/Plan    PT Assessment Patient needs continued PT services  PT Problem List Decreased strength;Decreased activity tolerance;Decreased mobility;Decreased knowledge of use of  DME;Cardiopulmonary status limiting activity       PT Treatment Interventions DME instruction;Gait training;Functional mobility training;Balance training;Patient/family education;Therapeutic exercise;Therapeutic activities    PT Goals (Current goals can be found in the Care Plan section)  Acute Rehab PT Goals Patient Stated Goal: To return home PT Goal Formulation: With patient Time For Goal Achievement: 05/14/17 Potential to Achieve Goals: Good    Frequency Min 3X/week   Barriers to discharge        Co-evaluation               AM-PAC PT "6 Clicks" Daily Activity  Outcome Measure Difficulty turning over in bed (including adjusting bedclothes, sheets and blankets)?: A Little Difficulty moving from lying on back to sitting on the side of the bed? : A Little Difficulty sitting down on and standing up from a chair with arms (e.g., wheelchair, bedside commode, etc,.)?: Total Help needed moving to and from a bed to chair (including a wheelchair)?: A Little Help needed walking in hospital room?: A Little Help needed climbing 3-5 steps with a railing? : A Lot 6 Click Score: 15    End of Session Equipment Utilized During Treatment: Gait belt;Oxygen Activity Tolerance: Patient tolerated treatment well Patient left: in chair;with call bell/phone within reach;with nursing/sitter in room Nurse Communication: Mobility status PT Visit Diagnosis: Other abnormalities of gait and mobility (R26.89)    Time: 9753-0051 PT Time Calculation (min) (ACUTE ONLY): 25 min   Charges:   PT Evaluation $PT Eval Moderate Complexity: 1 Procedure PT Treatments $Gait Training: 8-22 mins   PT G Codes:   PT G-Codes **NOT FOR INPATIENT CLASS** Functional Assessment Tool Used: AM-PAC 6 Clicks Basic Mobility Functional Limitation: Mobility: Walking and moving around Mobility: Walking and Moving Around Current Status (T0211): At least 40 percent but less than 60 percent impaired, limited or  restricted Mobility: Walking and Moving Around Goal Status [phone]): At least 40 percent but less than 60 percent impaired, limited or restricted    [place], [place] 05/07/2017   Reginia Naas 05/07/2017, 10:59 AM

## 2017-05-07 NOTE — Progress Notes (Signed)
Pt slept well during the night, Vitals stable, no any sign of SOB and distress noted, no any complain of pain, will continue to monitor the patient. 

## 2017-05-14 ENCOUNTER — Other Ambulatory Visit: Payer: Self-pay | Admitting: Nurse Practitioner

## 2017-05-14 DIAGNOSIS — M1990 Unspecified osteoarthritis, unspecified site: Secondary | ICD-10-CM

## 2017-05-15 DIAGNOSIS — J449 Chronic obstructive pulmonary disease, unspecified: Secondary | ICD-10-CM | POA: Diagnosis not present

## 2017-05-17 ENCOUNTER — Ambulatory Visit (INDEPENDENT_AMBULATORY_CARE_PROVIDER_SITE_OTHER): Payer: Medicare HMO | Admitting: Nurse Practitioner

## 2017-05-17 ENCOUNTER — Telehealth: Payer: Self-pay

## 2017-05-17 ENCOUNTER — Encounter: Payer: Self-pay | Admitting: Nurse Practitioner

## 2017-05-17 VITALS — BP 128/76 | HR 63 | Temp 98.3°F | Resp 18 | Ht 62.0 in | Wt 210.4 lb

## 2017-05-17 DIAGNOSIS — W19XXXD Unspecified fall, subsequent encounter: Secondary | ICD-10-CM | POA: Diagnosis not present

## 2017-05-17 DIAGNOSIS — J449 Chronic obstructive pulmonary disease, unspecified: Secondary | ICD-10-CM | POA: Diagnosis not present

## 2017-05-17 DIAGNOSIS — D649 Anemia, unspecified: Secondary | ICD-10-CM

## 2017-05-17 DIAGNOSIS — G8929 Other chronic pain: Secondary | ICD-10-CM | POA: Diagnosis not present

## 2017-05-17 DIAGNOSIS — N200 Calculus of kidney: Secondary | ICD-10-CM

## 2017-05-17 DIAGNOSIS — G4733 Obstructive sleep apnea (adult) (pediatric): Secondary | ICD-10-CM

## 2017-05-17 DIAGNOSIS — M545 Low back pain: Secondary | ICD-10-CM

## 2017-05-17 DIAGNOSIS — E1121 Type 2 diabetes mellitus with diabetic nephropathy: Secondary | ICD-10-CM

## 2017-05-17 DIAGNOSIS — Z9989 Dependence on other enabling machines and devices: Secondary | ICD-10-CM

## 2017-05-17 DIAGNOSIS — Z794 Long term (current) use of insulin: Secondary | ICD-10-CM | POA: Diagnosis not present

## 2017-05-17 LAB — CBC WITH DIFFERENTIAL/PLATELET
BASOS ABS: 0 {cells}/uL (ref 0–200)
Basophils Relative: 0 %
EOS ABS: 395 {cells}/uL (ref 15–500)
Eosinophils Relative: 5 %
HEMATOCRIT: 33.5 % — AB (ref 35.0–45.0)
Hemoglobin: 10.6 g/dL — ABNORMAL LOW (ref 11.7–15.5)
LYMPHS PCT: 34 %
Lymphs Abs: 2686 cells/uL (ref 850–3900)
MCH: 25.2 pg — AB (ref 27.0–33.0)
MCHC: 31.6 g/dL — AB (ref 32.0–36.0)
MCV: 79.8 fL — AB (ref 80.0–100.0)
MONOS PCT: 6 %
MPV: 9.8 fL (ref 7.5–12.5)
Monocytes Absolute: 474 cells/uL (ref 200–950)
NEUTROS PCT: 55 %
Neutro Abs: 4345 cells/uL (ref 1500–7800)
PLATELETS: 267 10*3/uL (ref 140–400)
RBC: 4.2 MIL/uL (ref 3.80–5.10)
RDW: 16.9 % — AB (ref 11.0–15.0)
WBC: 7.9 10*3/uL (ref 3.8–10.8)

## 2017-05-17 MED ORDER — HYDROCODONE-ACETAMINOPHEN 10-325 MG PO TABS: 1.0000 | ORAL_TABLET | Freq: Four times a day (QID) | ORAL | 0 refills | Status: DC | PRN

## 2017-05-17 NOTE — Telephone Encounter (Signed)
Michelle Buck with [employer] called and stated that they did not carry a tall upright walker. She stated that they could not order it from their vendor either.   Michelle Buck was informed and stated that PT was ordered for patient and PT can measure patient for a new walker if needed.   Patient was notified and agreed.

## 2017-05-17 NOTE — Patient Instructions (Addendum)
To take HYDROCODONE-APAP by mouth every 6 hours as needed pain Will get lab work today Make sure to make a follow up appt with Dr Halford Chessman, Pulmonary Phone: [phone] Will get home health referral for strength and gait training. Also will help with pain and to get you set up with the proper devices at home

## 2017-05-17 NOTE — Telephone Encounter (Signed)
I called patient's sister, [name], to find out what happened to patient's oxycodone that was prescribed on 05/03/17. [name] stated that she disposed of the oxycodone because it was making patient dizzy and the family was worried that it was adding to patient's mental decline.   Janett Billow was informed of this conversation.

## 2017-05-17 NOTE — Progress Notes (Signed)
Careteam: Patient Care Team: [name], NP as PCP - General (Nurse Practitioner) [name], MD as Consulting Physician (Ophthalmology) [name], MD as Consulting Physician (Nephrology)  Advanced Directive information Does Patient Have a Medical Advance Directive?: No  Allergies  Allergen Reactions  . Tramadol Other (See Comments)    Leg cramps   . Codeine Nausea And Vomiting    Patient states N/V with codeine  . Hydrocodone Nausea And Vomiting  . Penicillins Rash    Patient states rash/itch with penicillin Has patient had a PCN reaction causing immediate rash, facial/tongue/throat swelling, SOB or lightheadedness with hypotension: Yes- broke me out and i was itching Has patient had a PCN reaction causing severe rash involving mucus membranes or skin necrosis: No Has patient had a PCN reaction that required hospitalization Yes- i was already in the hospital Has patient had a PCN reaction occurring within the last 10 years: No If all of the above answers are "NO  . Tylenol [Acetaminophen] Hives    Chief Complaint  Patient presents with  . Hospitalization Follow-up    Pt is being seen due a hospital stay at [facility] from 6/7 to 6/8 because of fall. Pt reports that she is dizzy and legs hurt today.      HPI: Patient is a 81 y.o. female seen in the office today for hospital follow up.  Pt with medical history significant for peripheral arterial disease, diastolic heart failure, hypertension, diabetes with neuropathy, morbid obesity, COPD, 2 L oxygen dependent, OSA, CKD stage III, who went to the emergency department after falling in the bathroom without hitting her head, or losing consciousness. Patient has been increasingly debilitated. In addition, her brother reported that she has been "shaking with jerking motions." pt was found to have respiratory acidosis with hypercapnia, mild with PCO2 of 60. Noncompliant with CPAP which was a contributor to the  hypercapnia. CPAP during hospitalization.  Recent diagnosis of nephrolithiasis per CT abdomen and pelvis 6/4 .No dysuria at this time, pain is better control, urinalysis negative Reports she is now using CPAP at night Comes in office today without O2, reports tanks do not last her long enough. Unsure of company who supplies her O2.   Therapy is inhibited due to pain. Reports she starts having more pain with more activity. Taking hydrocodone 1 tablet three times daily, but then states she is taking at least 2 at bedtime due to pain. During hospitalization hydrocodone was changed to Oxycodone.  However [name] was called (pts sister) and she stated she threw way oxycodone because they were making her weak and loopy.   Overall she feels like she is doing well today.  Review of Systems:  Review of Systems  Constitutional: Negative for chills, fever and weight loss.  Respiratory: Positive for cough and shortness of breath. Negative for sputum production.        Cough and shortness of breath stable  Cardiovascular: Positive for leg swelling. Negative for chest pain and palpitations.  Gastrointestinal: Negative for abdominal pain, constipation, diarrhea and heartburn.  Genitourinary: Negative for dysuria, frequency and urgency.  Musculoskeletal: Positive for back pain and falls. Negative for joint pain and myalgias.  Skin: Negative.   Neurological: Positive for weakness. Negative for dizziness and headaches.  Psychiatric/Behavioral: Negative for depression and memory loss. The patient does not have insomnia.     Past Medical History:  Diagnosis Date  . Acute bronchitis   . Anxiety   . Arthritis   . Chronic kidney  disease, stage II (mild)   . Complications affecting other specified body systems, hypertension   . Congestive heart failure, unspecified    stress test in March at [facility]  . COPD (chronic obstructive pulmonary disease) ([place])    present for several years, diagnosed in  the last few years  . Degenerative arthritis of knee, bilateral   . Diabetes mellitus   . Diaphragmatic hernia without mention of obstruction or gangrene   . Disorder of bone and cartilage, unspecified   . GERD (gastroesophageal reflux disease)   . Hiatal hernia   . Hyperlipidemia   . Hypertension   . Hypopotassemia   . Insomnia, unspecified   . Lumbago   . Obese   . Osteoarthrosis, unspecified whether generalized or localized, unspecified site   . PONV (postoperative nausea and vomiting)   . Scoliosis   . Secondary diabetes mellitus with renal manifestations, not stated as uncontrolled, or unspecified(249.40)   . Unspecified glaucoma(365.9)   . Unspecified hereditary and idiopathic peripheral neuropathy    Past Surgical History:  Procedure Laterality Date  . ABDOMINAL HYSTERECTOMY    . COLONOSCOPY  08/06/2011  . FOOT SURGERY    . KNEE ARTHROSCOPY     bilateral  . SHOULDER SURGERY    . TOTAL HIP ARTHROPLASTY     bilateral   Social History:   reports that she quit smoking about 20 years ago. Her smoking use included Cigarettes. She has a 1.50 pack-year smoking history. She has never used smokeless tobacco. She reports that she does not drink alcohol or use drugs.  Family History  Problem Relation Age of Onset  . Diabetes Mother   . Heart disease Mother   . Heart disease Brother   . Diabetes Brother   . Kidney disease Father   . Scoliosis Sister   . Diabetes Brother   . Diabetes Brother   . Heart disease Brother   . Scoliosis Brother   . Stroke Brother   . Heart attack Brother     Medications: Patient's Medications  New Prescriptions   No medications on file  Previous Medications   ALBUTEROL (PROVENTIL HFA;VENTOLIN HFA) 108 (90 BASE) MCG/ACT INHALER    Inhale 2 puffs into the lungs every 4 (four) hours as needed for wheezing or shortness of breath.   ASPIRIN EC 81 MG TABLET    Take 81 mg by mouth daily.   BUDESONIDE (PULMICORT) 0.25 MG/2ML NEBULIZER SOLUTION     Take one ampule in Neb twice daily routinely for COPD  J44.1   CHOLECALCIFEROL (VITAMIN D3) 2000 UNITS TABS    Take 2,000 Units by mouth daily.   CILOSTAZOL (PLETAL) 100 MG TABLET    Take 1 tablet (100 mg total) by mouth daily.   DICLOFENAC SODIUM (VOLTAREN) 1 % GEL    APPLY 4 GRAMS TOPICALLY TWICE DAILY AS NEEDED FOR PAIN   DULOXETINE (CYMBALTA) 30 MG CAPSULE    Take one capsule by mouth once daily to help anxiety and pains   ESOMEPRAZOLE (NEXIUM) 40 MG CAPSULE    Take 1 capsule (40 mg total) by mouth daily.   FUROSEMIDE (LASIX) 40 MG TABLET    TAKE 1 TABLET BY MOUTH TWICE A DAY   GABAPENTIN (NEURONTIN) 100 MG CAPSULE    Take two capsules by mouth three times daily   GUAIFENESIN (ROBITUSSIN) 100 MG/5ML SYRUP    Take 200 mg by mouth 3 (three) times daily as needed for cough.   HYDRALAZINE (APRESOLINE) 50 MG TABLET  Take 1 tablet (50 mg total) by mouth 3 (three) times daily.   INSULIN ASPART PROTAMINE- ASPART (NOVOLOG MIX 70/30) (70-30) 100 UNIT/ML INJECTION    Inject 40 units subcutaneously with breakfast and inject 25 units subcutaneously with dinner to control blood sugar   INSULIN SYRINGE-NEEDLE U-100 (EASY TOUCH INSULIN SYRINGE) 31G X 5/16" 0.5 ML MISC    USE UP TO 2 TIMES A DAY AS DIRECTED.   IPRATROPIUM-ALBUTEROL (DUONEB) 0.5-2.5 (3) MG/3ML SOLN    Take 64ms by nebulization every 6 hours as needed for SOB/Wheezing   LINACLOTIDE (LINZESS) 145 MCG CAPS CAPSULE    Take 1 capsule (145 mcg total) by mouth 2 (two) times daily.   LUBIPROSTONE (AMITIZA) 24 MCG CAPSULE    Take one capsule by mouth twice daily with meals   METOPROLOL SUCCINATE (TOPROL-XL) 25 MG 24 HR TABLET    Take one tablet by mouth once daily with or immediately following a meal for blood pressure   ONDANSETRON (ZOFRAN ODT) 8 MG DISINTEGRATING TABLET    Take 1 tablet (8 mg total) by mouth every 8 (eight) hours as needed for nausea or vomiting.   OXYCODONE-ACETAMINOPHEN (PERCOCET/ROXICET) 5-325 MG TABLET    Take 1 tablet by mouth  every 6 (six) hours as needed for severe pain.   POTASSIUM CHLORIDE SA (K-DUR,KLOR-CON) 20 MEQ TABLET    Take 1 tablet (20 mEq total) by mouth daily.   SERTRALINE (ZOLOFT) 50 MG TABLET    Take 1 tablet (50 mg total) by mouth daily.   SIMVASTATIN (ZOCOR) 20 MG TABLET    Take 1 tablet (20 mg total) by mouth daily.   SUCRALFATE (CARAFATE) 1 G TABLET    TAKE 1 TABLET BY MOUTH TWICE DAILY ON EMPTY STOMACH   TAMSULOSIN (FLOMAX) 0.4 MG CAPS CAPSULE    Take 1 capsule (0.4 mg total) by mouth daily.   TIMOLOL (TIMOPTIC) 0.5 % OPHTHALMIC SOLUTION    INSTILL 1 DROP INTO EACH EYE TWICE A DAY   TRAVATAN Z 0.004 % SOLN OPHTHALMIC SOLUTION    Place 1 drop into both eyes at bedtime.   Modified Medications   No medications on file  Discontinued Medications   No medications on file     Physical Exam:  Vitals:   05/17/17 1354  BP: 128/76  Pulse: 63  Resp: 18  Temp: 98.3 F (36.8 C)  TempSrc: Oral  SpO2: 95%  Weight: 210 lb 6.4 oz (95.4 kg)  Height: _0  (1.575 m)   Body mass index is 38.48 kg/m.  Physical Exam  Constitutional: She is oriented to person, place, and time. She appears well-developed and well-nourished.  HENT:  Mouth/Throat: Oropharynx is clear and moist. No oropharyngeal exudate.  Eyes: Pupils are equal, round, and reactive to light.  Neck: Neck supple. Carotid bruit is not present.  Cardiovascular: Normal rate, regular rhythm and intact distal pulses.  Exam reveals no gallop and no friction rub.   Murmur (1/6 SEM) heard. Pulmonary/Chest: No respiratory distress. She has decreased breath sounds. She has no wheezes. She has no rales.  Diminished throughout  Abdominal: Soft. Bowel sounds are normal. There is no hepatomegaly.  Genitourinary: No vaginal discharge found.  Musculoskeletal: She exhibits edema.  Uses rolling walker; trace edema  Neurological: She is alert and oriented to person, place, and time.  Skin: Skin is warm and dry.  Psychiatric: She has a normal mood and  affect. Her behavior is normal. Thought content normal.    Labs reviewed: Basic Metabolic Panel:  Recent Labs  05/03/17 1204 05/06/17 0831 05/07/17 0342  NA 140 140 139  K 3.4* 4.0 3.5  CL 104 101 98*  CO2 _0 GLUCOSE 98 201* 83  BUN 23* 23* 26*  CREATININE 2.35* 2.68* 2.60*  CALCIUM 9.0 9.2 9.1   Liver Function Tests:  Recent Labs  05/03/17 1204 05/06/17 0831 05/07/17 0342  AST 17 18 14*  ALT 8* 12* 10*  ALKPHOS 88 93 78  BILITOT 0.3 0.6 0.6  PROT 7.3 7.0 6.5  ALBUMIN 3.5 3.4* 3.1*    Recent Labs  05/03/17 1204 05/06/17 0831  LIPASE 19 17    Recent Labs  05/06/17 0831  AMMONIA 29   CBC:  Recent Labs  09/25/16 0359 10/06/16 1428 05/03/17 1204 05/06/17 0831 05/07/17 0342  WBC 6.7 9.7 7.5 7.8 8.1  NEUTROABS 3.4 6,208  --  6.1  --   HGB 10.2* 10.3* 10.3* 10.0* 9.1*  HCT 31.9* 32.3* 32.0* 33.0* 29.7*  MCV 80.8 81.6 79.2 82.5 82.0  PLT 234 252 286 271 261   Lipid Panel: No results for input(s): CHOL, HDL, LDLCALC, TRIG, CHOLHDL, LDLDIRECT in the last 8760 hours. TSH: No results for input(s): TSH in the last 8760 hours. A1C: Lab Results  Component Value Date   HGBA1C 6.8 (H) 03/15/2017     Assessment/Plan 1. OSA on CPAP Reports compliance with CPAP at this time.   2. Chronic obstructive pulmonary disease, unspecified COPD type ([place]) -stable at this time. Not wearing O2 today because reports tank runs out too quickly. O2 stable at this time. To cont current regimen.  - BMP with eGFR  3. Anemia, unspecified type - CBC with Differential/Platelets  4. Type 2 diabetes mellitus with diabetic nephropathy, with long-term current use of insulin (HCC) Blood sugars around 130-160 on Novolog 70/30 mix. To cont current regimen. Has follow up with [facility] scheduled.   5. Nephrolithiasis Following with urologist.   6. Fall, subsequent encounter No further falls. Sister [name] called and reports that she threw out the Rx for oxycodone from  prior ED visit because it was making the pt too loopy with altered gait. Pt is very confused when it comes to her medication and reported conflicting information during visit Oxycodone has been DC off medication list Will cont hydrocodone-APAP 10-325 mg every 6 hours at this time- there was NOT a new RX given but just added to her medication list. Rx was given on 04/28/17 - Ambulatory referral to [facility] for PT   7. Chronic midline low back pain without sciatica - Ambulatory referral to [place]  To follow up in 6 weeks  Maikel Neisler K. Harle Battiest  [facility] [phone] 8 am - 5 pm) [phone] (after hours)

## 2017-05-17 NOTE — Telephone Encounter (Signed)
I called [employer] to find out what was needed for patient to be able to get an upright walker to help with her gait and stability.   I was told that a rep would call me this afternoon with the needed information.

## 2017-05-18 LAB — BASIC METABOLIC PANEL WITH GFR
BUN: 39 mg/dL — AB (ref 7–25)
CO2: 25 mmol/L (ref 20–31)
CREATININE: 2.68 mg/dL — AB (ref 0.60–0.93)
Calcium: 8.9 mg/dL (ref 8.6–10.4)
Chloride: 102 mmol/L (ref 98–110)
GFR, EST AFRICAN AMERICAN: 19 mL/min — AB (ref >=60)
GFR, Est Non African American: 16 mL/min — ABNORMAL LOW (ref >=60)
Glucose, Bld: 117 mg/dL — ABNORMAL HIGH (ref 65–99)
Potassium: 3.4 mmol/L — ABNORMAL LOW (ref 3.5–5.3)
Sodium: 141 mmol/L (ref 135–146)

## 2017-05-18 NOTE — Addendum Note (Signed)
Addended by: [name] on: 05/18/2017 01:30 PM   Modules accepted: Level of Service

## 2017-05-18 NOTE — Addendum Note (Signed)
Addended by: Denyse Amass on: 05/18/2017 03:09 PM   Modules accepted: Orders

## 2017-05-19 ENCOUNTER — Telehealth: Payer: Self-pay

## 2017-05-19 NOTE — Telephone Encounter (Signed)
A fax was received from [facility] [employer] requesting chart notes for the following information in order to process repair services for device.   1) Chart notes over the previous 12 months that describe the continued use and medical necessity of the power mobility device.   2) Current entry to your patient's chart note that indicates that your patient continues to use the power mobility device.   Note: Letters of medical necessity and Rx are NOT permitted; the documentation MUST be in the patient's chart note.  After reviewing all [place] notes for the last 12 months, there was no mention of patient using a powered chair or other device. I called patient explain to her that we did not have any documentation of her using this device within the last year so we could not submit such documentation to [employer]. After trying to explain the situation to patient multiple times, patient hung up.

## 2017-05-19 NOTE — Telephone Encounter (Signed)
I spoke with patient again to see if we could get her in for an appointment early next week so that the need for the [facility] device can be determined.   Patient agreed to an appointment on 05/25/17.   Form has been placed in filing cabinet in folder for 26.

## 2017-05-22 DIAGNOSIS — J449 Chronic obstructive pulmonary disease, unspecified: Secondary | ICD-10-CM | POA: Diagnosis not present

## 2017-05-25 ENCOUNTER — Ambulatory Visit (INDEPENDENT_AMBULATORY_CARE_PROVIDER_SITE_OTHER): Payer: Medicare HMO | Admitting: Nurse Practitioner

## 2017-05-25 ENCOUNTER — Telehealth: Payer: Self-pay

## 2017-05-25 ENCOUNTER — Encounter: Payer: Self-pay | Admitting: Nurse Practitioner

## 2017-05-25 VITALS — BP 126/80 | HR 78 | Temp 97.5°F | Resp 17 | Ht 62.0 in | Wt 212.8 lb

## 2017-05-25 DIAGNOSIS — R269 Unspecified abnormalities of gait and mobility: Secondary | ICD-10-CM

## 2017-05-25 DIAGNOSIS — M545 Low back pain: Secondary | ICD-10-CM | POA: Diagnosis not present

## 2017-05-25 DIAGNOSIS — G8929 Other chronic pain: Secondary | ICD-10-CM

## 2017-05-25 DIAGNOSIS — J449 Chronic obstructive pulmonary disease, unspecified: Secondary | ICD-10-CM

## 2017-05-25 DIAGNOSIS — M25511 Pain in right shoulder: Secondary | ICD-10-CM | POA: Diagnosis not present

## 2017-05-25 DIAGNOSIS — M25512 Pain in left shoulder: Secondary | ICD-10-CM | POA: Diagnosis not present

## 2017-05-25 NOTE — Telephone Encounter (Signed)
Just call the patient and inform her of these choices

## 2017-05-25 NOTE — Telephone Encounter (Signed)
Pt was seen in office today to discuss mobility issues and the necessary repairs to her [employer].   Completed forms and office note were faxed to [phone].  Forms were placed in Deetra Booton's mail box until sure that fax went through.

## 2017-05-25 NOTE — Progress Notes (Signed)
Careteam: Patient Care Team: [name], NP as PCP - General (Nurse Practitioner) [name], MD as Consulting Physician (Ophthalmology) [name], MD as Consulting Physician (Nephrology)  Advanced Directive information Does Patient Have a Medical Advance Directive?: No  Allergies  Allergen Reactions  . Tramadol Other (See Comments)    Leg cramps   . Codeine Nausea And Vomiting    Patient states N/V with codeine  . Hydrocodone Nausea And Vomiting  . Penicillins Rash    Patient states rash/itch with penicillin Has patient had a PCN reaction causing immediate rash, facial/tongue/throat swelling, SOB or lightheadedness with hypotension: Yes- broke me out and i was itching Has patient had a PCN reaction causing severe rash involving mucus membranes or skin necrosis: No Has patient had a PCN reaction that required hospitalization Yes- i was already in the hospital Has patient had a PCN reaction occurring within the last 10 years: No If all of the above answers are "NO  . Tylenol [Acetaminophen] Hives    Chief Complaint  Patient presents with  . Acute Visit    Pt is being seen to discuss mobility issues. Pt also has a form to be completed for [employer] repair.      HPI: Patient is a 81 y.o. female seen in the office today due to mobility issues.  Pt with hx of morbid obesity, CHF, HTN, COPD, DM and chronic pain. She also has lumbar spinal stenosis at multiple level, DJD, advanced OA of left shoulder joint and chronic rotator cuff tear on the right. She followed with orthopedic Dr Eulas Post but he has retired at this time. Was sent to another orthopedic who recommended exercise which she attempts to do but due to pain she is limited.  Has been using a power wheel chair which significantly improves her quality of life and makes it able for her to do things around her house that otherwise she would not be able to do. She is able to get up and go into the kitchen to  eat and drink and go into the bathroom to use the toilet. conts to have an aid that helps her for a few hours a day but otherwise she is there by herself.  Using hydrocodone-apap for pain management.   Review of Systems:  Review of Systems  Constitutional: Negative for chills, fever and weight loss.  Respiratory: Positive for cough and shortness of breath. Negative for sputum production.        Cough and shortness of breath stable. Uses O2 at home but can not bring tank out because it is too heavy. Using albuterol ~once daily  Cardiovascular: Positive for leg swelling. Negative for chest pain and palpitations.  Gastrointestinal: Negative for abdominal pain, constipation, diarrhea and heartburn.  Genitourinary: Negative for dysuria, frequency and urgency.  Musculoskeletal: Positive for back pain, falls (last fall prior to hospitalization. ) and joint pain. Negative for myalgias.  Skin: Negative.   Neurological: Positive for weakness. Negative for dizziness and headaches.  Psychiatric/Behavioral: Negative for depression and memory loss. The patient does not have insomnia.     Past Medical History:  Diagnosis Date  . Acute bronchitis   . Anxiety   . Arthritis   . Chronic kidney disease, stage II (mild)   . Complications affecting other specified body systems, hypertension   . Congestive heart failure, unspecified    stress test in March at [facility]  . COPD (chronic obstructive pulmonary disease) ([place])    present for several  years, diagnosed in the last few years  . Degenerative arthritis of knee, bilateral   . Diabetes mellitus   . Diaphragmatic hernia without mention of obstruction or gangrene   . Disorder of bone and cartilage, unspecified   . GERD (gastroesophageal reflux disease)   . Hiatal hernia   . Hyperlipidemia   . Hypertension   . Hypopotassemia   . Insomnia, unspecified   . Lumbago   . Obese   . Osteoarthrosis, unspecified whether generalized or localized,  unspecified site   . PONV (postoperative nausea and vomiting)   . Scoliosis   . Secondary diabetes mellitus with renal manifestations, not stated as uncontrolled, or unspecified(249.40)   . Unspecified glaucoma(365.9)   . Unspecified hereditary and idiopathic peripheral neuropathy    Past Surgical History:  Procedure Laterality Date  . ABDOMINAL HYSTERECTOMY    . COLONOSCOPY  08/06/2011  . FOOT SURGERY    . KNEE ARTHROSCOPY     bilateral  . SHOULDER SURGERY    . TOTAL HIP ARTHROPLASTY     bilateral   Social History:   reports that she quit smoking about 20 years ago. Her smoking use included Cigarettes. She has a 1.50 pack-year smoking history. She has never used smokeless tobacco. She reports that she does not drink alcohol or use drugs.  Family History  Problem Relation Age of Onset  . Diabetes Mother   . Heart disease Mother   . Heart disease Brother   . Diabetes Brother   . Kidney disease Father   . Scoliosis Sister   . Diabetes Brother   . Diabetes Brother   . Heart disease Brother   . Scoliosis Brother   . Stroke Brother   . Heart attack Brother     Medications: Patient's Medications  New Prescriptions   No medications on file  Previous Medications   ALBUTEROL (PROVENTIL HFA;VENTOLIN HFA) 108 (90 BASE) MCG/ACT INHALER    Inhale 2 puffs into the lungs every 4 (four) hours as needed for wheezing or shortness of breath.   ASPIRIN EC 81 MG TABLET    Take 81 mg by mouth daily.   BUDESONIDE (PULMICORT) 0.25 MG/2ML NEBULIZER SOLUTION    Take one ampule in Neb twice daily routinely for COPD  J44.1   CHOLECALCIFEROL (VITAMIN D3) 2000 UNITS TABS    Take 2,000 Units by mouth daily.   CILOSTAZOL (PLETAL) 100 MG TABLET    Take 1 tablet (100 mg total) by mouth daily.   DICLOFENAC SODIUM (VOLTAREN) 1 % GEL    APPLY 4 GRAMS TOPICALLY TWICE DAILY AS NEEDED FOR PAIN   DULOXETINE (CYMBALTA) 30 MG CAPSULE    Take one capsule by mouth once daily to help anxiety and pains    ESOMEPRAZOLE (NEXIUM) 40 MG CAPSULE    Take 1 capsule (40 mg total) by mouth daily.   FUROSEMIDE (LASIX) 40 MG TABLET    TAKE 1 TABLET BY MOUTH TWICE A DAY   GABAPENTIN (NEURONTIN) 100 MG CAPSULE    Take two capsules by mouth three times daily   GUAIFENESIN (ROBITUSSIN) 100 MG/5ML SYRUP    Take 200 mg by mouth 3 (three) times daily as needed for cough.   HYDRALAZINE (APRESOLINE) 50 MG TABLET    Take 1 tablet (50 mg total) by mouth 3 (three) times daily.   HYDROCODONE-ACETAMINOPHEN (NORCO) 10-325 MG TABLET    Take 1 tablet by mouth every 6 (six) hours as needed.   INSULIN ASPART PROTAMINE- ASPART (NOVOLOG MIX 70/30) (70-30) 100  UNIT/ML INJECTION    Inject 40 units subcutaneously with breakfast and inject 25 units subcutaneously with dinner to control blood sugar   INSULIN SYRINGE-NEEDLE U-100 (EASY TOUCH INSULIN SYRINGE) 31G X 5/16" 0.5 ML MISC    USE UP TO 2 TIMES A DAY AS DIRECTED.   IPRATROPIUM-ALBUTEROL (DUONEB) 0.5-2.5 (3) MG/3ML SOLN    Take 34mls by nebulization every 6 hours as needed for SOB/Wheezing   LUBIPROSTONE (AMITIZA) 24 MCG CAPSULE    Take one capsule by mouth twice daily with meals   METOPROLOL SUCCINATE (TOPROL-XL) 25 MG 24 HR TABLET    Take one tablet by mouth once daily with or immediately following a meal for blood pressure   ONDANSETRON (ZOFRAN ODT) 8 MG DISINTEGRATING TABLET    Take 1 tablet (8 mg total) by mouth every 8 (eight) hours as needed for nausea or vomiting.   POTASSIUM CHLORIDE SA (K-DUR,KLOR-CON) 20 MEQ TABLET    Take 1 tablet (20 mEq total) by mouth daily.   SERTRALINE (ZOLOFT) 50 MG TABLET    Take 1 tablet (50 mg total) by mouth daily.   SIMVASTATIN (ZOCOR) 20 MG TABLET    Take 1 tablet (20 mg total) by mouth daily.   SUCRALFATE (CARAFATE) 1 G TABLET    TAKE 1 TABLET BY MOUTH TWICE DAILY ON EMPTY STOMACH   TAMSULOSIN (FLOMAX) 0.4 MG CAPS CAPSULE    Take 1 capsule (0.4 mg total) by mouth daily.   TIMOLOL (TIMOPTIC) 0.5 % OPHTHALMIC SOLUTION    INSTILL 1 DROP INTO  EACH EYE TWICE A DAY   TRAVATAN Z 0.004 % SOLN OPHTHALMIC SOLUTION    Place 1 drop into both eyes at bedtime.   Modified Medications   No medications on file  Discontinued Medications   No medications on file     Physical Exam:  Vitals:   05/25/17 1004  BP: 126/80  Pulse: 78  Resp: 17  Temp: 97.5 F (36.4 C)  TempSrc: Oral  SpO2: 94%  Weight: 212 lb 12.8 oz (96.5 kg)  Height: 5\' 2"  (1.575 m)   Body mass index is 38.92 kg/m.  Physical Exam  Constitutional: She is oriented to person, place, and time. She appears well-developed and well-nourished.  HENT:  Mouth/Throat: Oropharynx is clear and moist. No oropharyngeal exudate.  Eyes: Pupils are equal, round, and reactive to light.  Neck: Neck supple. Carotid bruit is not present.  Cardiovascular: Normal rate, regular rhythm and intact distal pulses.  Exam reveals no gallop and no friction rub.   Murmur (1/6 SEM) heard. Pulmonary/Chest: No respiratory distress. She has decreased breath sounds. She has no wheezes. She has no rales.  Diminished throughout  Abdominal: Soft. Bowel sounds are normal. There is no hepatomegaly.  Genitourinary: No vaginal discharge found.  Musculoskeletal: She exhibits edema and deformity.  Uses rolling walker today, slow gait; trace edema Uses hands to push herself with her hand to get up from the chair.  Both UE abduction limited to 35-40 degrees. External rotation illicits pain. Has crepitus in both her shoulder joint. Strength 3/5 in upper extremity with pain of 7-8/10. Strength 3/5 in lower extremity. Right foot internally rotates with ambulation  Neurological: She is alert and oriented to person, place, and time.  Skin: Skin is warm and dry.  Psychiatric: She has a normal mood and affect. Her behavior is normal. Thought content normal.    Labs reviewed: Basic Metabolic Panel:  Recent Labs  05/06/17 0831 05/07/17 0342 05/17/17 1448  NA 140 139  141  K 4.0 3.5 3.4*  CL 101 98* 102  CO2  27 31 25   GLUCOSE 201* 83 117*  BUN 23* 26* 39*  CREATININE 2.68* 2.60* 2.68*  CALCIUM 9.2 9.1 8.9   Liver Function Tests:  Recent Labs  05/03/17 1204 05/06/17 0831 05/07/17 0342  AST 17 18 14*  ALT 8* 12* 10*  ALKPHOS 88 93 78  BILITOT 0.3 0.6 0.6  PROT 7.3 7.0 6.5  ALBUMIN 3.5 3.4* 3.1*    Recent Labs  05/03/17 1204 05/06/17 0831  LIPASE 19 17    Recent Labs  05/06/17 0831  AMMONIA 29   CBC:  Recent Labs  10/06/16 1428  05/06/17 0831 05/07/17 0342 05/17/17 1448  WBC 9.7  < > 7.8 8.1 7.9  NEUTROABS 6,208  --  6.1  --  4,345  HGB 10.3*  < > 10.0* 9.1* 10.6*  HCT 32.3*  < > 33.0* 29.7* 33.5*  MCV 81.6  < > 82.5 82.0 79.8*  PLT 252  < > 271 261 267  < > = values in this interval not displayed. Lipid Panel: No results for input(s): CHOL, HDL, LDLCALC, TRIG, CHOLHDL, LDLDIRECT in the last 8760 hours. TSH: No results for input(s): TSH in the last 8760 hours. A1C: Lab Results  Component Value Date   HGBA1C 6.8 (H) 03/15/2017     Assessment/Plan 1. Chronic pain of both shoulders 2. Chronic midline low back pain without sciatica 3. Abnormality of gait Pt with chronic shoulder pain due to OA, low back pain due to spinal stenosis requiring hydrocodone/apap. She has abnormal gait due pain. OA has limited ROM making it difficult to use walker- she hunches over walker during OV and her strength is limited to upper and lower extremities therefore requires power wheelchair.   4. Chronic obstructive pulmonary disease, unspecified COPD type ([place]) Pt with decrease strength and unable to carry out her O2 to appts making her more short of breath. Pt to call with DME company and we will call to see about setting up portable O2 that she is able to take with her to appts and when she leaves home.   To keep follow up appt.  Carlos American. Harle Battiest  [facility] [phone] 8 am - 5 pm) [phone] (after hours)

## 2017-05-25 NOTE — Patient Instructions (Signed)
Call us with agency that does your oxygen

## 2017-05-25 NOTE — Telephone Encounter (Signed)
I spoke with patient and she stated that she does not use the portable oxygen because it hurts her to carry it. She cannot carry the canister sling over her shoulders due to pain and she cannot pull a canister cart due to pain in her hands. She stated that she uses the O2 at home but she cannot use the portable due to pain, so it does not matter what size canisters she can get.

## 2017-05-25 NOTE — Telephone Encounter (Signed)
I called [employer] to see if there was another lighter weight portable oxygen canister that can be ordered for patient. I was told that patient is already on the 3rd smallest container they have. She is on a D-tank, which is about the size of a 2 Lt bottle and it holds 2 hours of O2 at 2-3L. The next smaller size is the C Tank, which is the sized of a wine bottle and only holds enough O2 for 1/2 to 1 hour.    Please advise?

## 2017-05-27 DIAGNOSIS — J449 Chronic obstructive pulmonary disease, unspecified: Secondary | ICD-10-CM | POA: Diagnosis not present

## 2017-05-27 DIAGNOSIS — D631 Anemia in chronic kidney disease: Secondary | ICD-10-CM | POA: Diagnosis not present

## 2017-05-27 DIAGNOSIS — E1122 Type 2 diabetes mellitus with diabetic chronic kidney disease: Secondary | ICD-10-CM | POA: Diagnosis not present

## 2017-05-27 DIAGNOSIS — I130 Hypertensive heart and chronic kidney disease with heart failure and stage 1 through stage 4 chronic kidney disease, or unspecified chronic kidney disease: Secondary | ICD-10-CM | POA: Diagnosis not present

## 2017-05-27 DIAGNOSIS — E1142 Type 2 diabetes mellitus with diabetic polyneuropathy: Secondary | ICD-10-CM | POA: Diagnosis not present

## 2017-05-27 DIAGNOSIS — N183 Chronic kidney disease, stage 3 (moderate): Secondary | ICD-10-CM | POA: Diagnosis not present

## 2017-05-27 DIAGNOSIS — E1151 Type 2 diabetes mellitus with diabetic peripheral angiopathy without gangrene: Secondary | ICD-10-CM | POA: Diagnosis not present

## 2017-05-27 DIAGNOSIS — M479 Spondylosis, unspecified: Secondary | ICD-10-CM | POA: Diagnosis not present

## 2017-05-27 DIAGNOSIS — I5032 Chronic diastolic (congestive) heart failure: Secondary | ICD-10-CM | POA: Diagnosis not present

## 2017-05-27 DIAGNOSIS — I5022 Chronic systolic (congestive) heart failure: Secondary | ICD-10-CM | POA: Diagnosis not present

## 2017-05-27 DIAGNOSIS — M545 Low back pain: Secondary | ICD-10-CM | POA: Diagnosis not present

## 2017-05-28 ENCOUNTER — Other Ambulatory Visit: Payer: Self-pay

## 2017-05-28 MED ORDER — HYDROCODONE-ACETAMINOPHEN 10-325 MG PO TABS: 1.0000 | ORAL_TABLET | Freq: Four times a day (QID) | ORAL | 0 refills | Status: DC | PRN

## 2017-06-01 ENCOUNTER — Telehealth: Payer: Self-pay

## 2017-06-01 DIAGNOSIS — E1122 Type 2 diabetes mellitus with diabetic chronic kidney disease: Secondary | ICD-10-CM | POA: Diagnosis not present

## 2017-06-01 DIAGNOSIS — J449 Chronic obstructive pulmonary disease, unspecified: Secondary | ICD-10-CM | POA: Diagnosis not present

## 2017-06-01 DIAGNOSIS — E1151 Type 2 diabetes mellitus with diabetic peripheral angiopathy without gangrene: Secondary | ICD-10-CM | POA: Diagnosis not present

## 2017-06-01 DIAGNOSIS — D631 Anemia in chronic kidney disease: Secondary | ICD-10-CM | POA: Diagnosis not present

## 2017-06-01 DIAGNOSIS — M479 Spondylosis, unspecified: Secondary | ICD-10-CM | POA: Diagnosis not present

## 2017-06-01 DIAGNOSIS — E1142 Type 2 diabetes mellitus with diabetic polyneuropathy: Secondary | ICD-10-CM | POA: Diagnosis not present

## 2017-06-01 DIAGNOSIS — I5032 Chronic diastolic (congestive) heart failure: Secondary | ICD-10-CM | POA: Diagnosis not present

## 2017-06-01 DIAGNOSIS — I130 Hypertensive heart and chronic kidney disease with heart failure and stage 1 through stage 4 chronic kidney disease, or unspecified chronic kidney disease: Secondary | ICD-10-CM | POA: Diagnosis not present

## 2017-06-01 DIAGNOSIS — M545 Low back pain: Secondary | ICD-10-CM | POA: Diagnosis not present

## 2017-06-01 DIAGNOSIS — N183 Chronic kidney disease, stage 3 (moderate): Secondary | ICD-10-CM | POA: Diagnosis not present

## 2017-06-01 NOTE — Telephone Encounter (Signed)
[place] called to ask for a verbal order for continued PT/OT and social work.  PT 1 x for 1 week 2 x per week for 6 weeks  OT to evaluate ability to perform household chores  Social Work for [employer].  Verbal order given

## 2017-06-02 DIAGNOSIS — D631 Anemia in chronic kidney disease: Secondary | ICD-10-CM | POA: Diagnosis not present

## 2017-06-02 DIAGNOSIS — J449 Chronic obstructive pulmonary disease, unspecified: Secondary | ICD-10-CM | POA: Diagnosis not present

## 2017-06-02 DIAGNOSIS — E1142 Type 2 diabetes mellitus with diabetic polyneuropathy: Secondary | ICD-10-CM | POA: Diagnosis not present

## 2017-06-02 DIAGNOSIS — I5032 Chronic diastolic (congestive) heart failure: Secondary | ICD-10-CM | POA: Diagnosis not present

## 2017-06-02 DIAGNOSIS — E1122 Type 2 diabetes mellitus with diabetic chronic kidney disease: Secondary | ICD-10-CM | POA: Diagnosis not present

## 2017-06-02 DIAGNOSIS — E1151 Type 2 diabetes mellitus with diabetic peripheral angiopathy without gangrene: Secondary | ICD-10-CM | POA: Diagnosis not present

## 2017-06-02 DIAGNOSIS — I130 Hypertensive heart and chronic kidney disease with heart failure and stage 1 through stage 4 chronic kidney disease, or unspecified chronic kidney disease: Secondary | ICD-10-CM | POA: Diagnosis not present

## 2017-06-02 DIAGNOSIS — M479 Spondylosis, unspecified: Secondary | ICD-10-CM | POA: Diagnosis not present

## 2017-06-02 DIAGNOSIS — N183 Chronic kidney disease, stage 3 (moderate): Secondary | ICD-10-CM | POA: Diagnosis not present

## 2017-06-02 DIAGNOSIS — M545 Low back pain: Secondary | ICD-10-CM | POA: Diagnosis not present

## 2017-06-04 DIAGNOSIS — I130 Hypertensive heart and chronic kidney disease with heart failure and stage 1 through stage 4 chronic kidney disease, or unspecified chronic kidney disease: Secondary | ICD-10-CM | POA: Diagnosis not present

## 2017-06-04 DIAGNOSIS — E1122 Type 2 diabetes mellitus with diabetic chronic kidney disease: Secondary | ICD-10-CM | POA: Diagnosis not present

## 2017-06-04 DIAGNOSIS — I5032 Chronic diastolic (congestive) heart failure: Secondary | ICD-10-CM | POA: Diagnosis not present

## 2017-06-04 DIAGNOSIS — N183 Chronic kidney disease, stage 3 (moderate): Secondary | ICD-10-CM | POA: Diagnosis not present

## 2017-06-04 DIAGNOSIS — D631 Anemia in chronic kidney disease: Secondary | ICD-10-CM | POA: Diagnosis not present

## 2017-06-04 DIAGNOSIS — J449 Chronic obstructive pulmonary disease, unspecified: Secondary | ICD-10-CM | POA: Diagnosis not present

## 2017-06-04 DIAGNOSIS — E1151 Type 2 diabetes mellitus with diabetic peripheral angiopathy without gangrene: Secondary | ICD-10-CM | POA: Diagnosis not present

## 2017-06-04 DIAGNOSIS — M479 Spondylosis, unspecified: Secondary | ICD-10-CM | POA: Diagnosis not present

## 2017-06-04 DIAGNOSIS — E1142 Type 2 diabetes mellitus with diabetic polyneuropathy: Secondary | ICD-10-CM | POA: Diagnosis not present

## 2017-06-04 DIAGNOSIS — M545 Low back pain: Secondary | ICD-10-CM | POA: Diagnosis not present

## 2017-06-07 DIAGNOSIS — J449 Chronic obstructive pulmonary disease, unspecified: Secondary | ICD-10-CM | POA: Diagnosis not present

## 2017-06-07 DIAGNOSIS — E1142 Type 2 diabetes mellitus with diabetic polyneuropathy: Secondary | ICD-10-CM | POA: Diagnosis not present

## 2017-06-07 DIAGNOSIS — M545 Low back pain: Secondary | ICD-10-CM | POA: Diagnosis not present

## 2017-06-07 DIAGNOSIS — E1151 Type 2 diabetes mellitus with diabetic peripheral angiopathy without gangrene: Secondary | ICD-10-CM | POA: Diagnosis not present

## 2017-06-07 DIAGNOSIS — N183 Chronic kidney disease, stage 3 (moderate): Secondary | ICD-10-CM | POA: Diagnosis not present

## 2017-06-07 DIAGNOSIS — I130 Hypertensive heart and chronic kidney disease with heart failure and stage 1 through stage 4 chronic kidney disease, or unspecified chronic kidney disease: Secondary | ICD-10-CM | POA: Diagnosis not present

## 2017-06-07 DIAGNOSIS — I5032 Chronic diastolic (congestive) heart failure: Secondary | ICD-10-CM | POA: Diagnosis not present

## 2017-06-07 DIAGNOSIS — M479 Spondylosis, unspecified: Secondary | ICD-10-CM | POA: Diagnosis not present

## 2017-06-07 DIAGNOSIS — D631 Anemia in chronic kidney disease: Secondary | ICD-10-CM | POA: Diagnosis not present

## 2017-06-07 DIAGNOSIS — E1122 Type 2 diabetes mellitus with diabetic chronic kidney disease: Secondary | ICD-10-CM | POA: Diagnosis not present

## 2017-06-11 ENCOUNTER — Other Ambulatory Visit: Payer: Self-pay | Admitting: Nurse Practitioner

## 2017-06-11 DIAGNOSIS — D631 Anemia in chronic kidney disease: Secondary | ICD-10-CM | POA: Diagnosis not present

## 2017-06-11 DIAGNOSIS — E1122 Type 2 diabetes mellitus with diabetic chronic kidney disease: Secondary | ICD-10-CM | POA: Diagnosis not present

## 2017-06-11 DIAGNOSIS — J449 Chronic obstructive pulmonary disease, unspecified: Secondary | ICD-10-CM | POA: Diagnosis not present

## 2017-06-11 DIAGNOSIS — E1142 Type 2 diabetes mellitus with diabetic polyneuropathy: Secondary | ICD-10-CM | POA: Diagnosis not present

## 2017-06-11 DIAGNOSIS — I130 Hypertensive heart and chronic kidney disease with heart failure and stage 1 through stage 4 chronic kidney disease, or unspecified chronic kidney disease: Secondary | ICD-10-CM | POA: Diagnosis not present

## 2017-06-11 DIAGNOSIS — E1151 Type 2 diabetes mellitus with diabetic peripheral angiopathy without gangrene: Secondary | ICD-10-CM | POA: Diagnosis not present

## 2017-06-11 DIAGNOSIS — N183 Chronic kidney disease, stage 3 (moderate): Secondary | ICD-10-CM | POA: Diagnosis not present

## 2017-06-11 DIAGNOSIS — M545 Low back pain: Secondary | ICD-10-CM | POA: Diagnosis not present

## 2017-06-11 DIAGNOSIS — G4733 Obstructive sleep apnea (adult) (pediatric): Secondary | ICD-10-CM | POA: Diagnosis not present

## 2017-06-11 DIAGNOSIS — M479 Spondylosis, unspecified: Secondary | ICD-10-CM | POA: Diagnosis not present

## 2017-06-11 DIAGNOSIS — I5032 Chronic diastolic (congestive) heart failure: Secondary | ICD-10-CM | POA: Diagnosis not present

## 2017-06-12 ENCOUNTER — Other Ambulatory Visit: Payer: Self-pay | Admitting: Nurse Practitioner

## 2017-06-12 DIAGNOSIS — J441 Chronic obstructive pulmonary disease with (acute) exacerbation: Secondary | ICD-10-CM

## 2017-06-14 ENCOUNTER — Ambulatory Visit: Payer: Medicare HMO | Admitting: Pharmacotherapy

## 2017-06-14 ENCOUNTER — Encounter: Payer: Self-pay | Admitting: *Deleted

## 2017-06-14 DIAGNOSIS — E119 Type 2 diabetes mellitus without complications: Secondary | ICD-10-CM | POA: Diagnosis not present

## 2017-06-14 DIAGNOSIS — I130 Hypertensive heart and chronic kidney disease with heart failure and stage 1 through stage 4 chronic kidney disease, or unspecified chronic kidney disease: Secondary | ICD-10-CM | POA: Diagnosis not present

## 2017-06-14 DIAGNOSIS — I5032 Chronic diastolic (congestive) heart failure: Secondary | ICD-10-CM | POA: Diagnosis not present

## 2017-06-14 DIAGNOSIS — E1122 Type 2 diabetes mellitus with diabetic chronic kidney disease: Secondary | ICD-10-CM | POA: Diagnosis not present

## 2017-06-14 DIAGNOSIS — E1151 Type 2 diabetes mellitus with diabetic peripheral angiopathy without gangrene: Secondary | ICD-10-CM | POA: Diagnosis not present

## 2017-06-14 DIAGNOSIS — H401131 Primary open-angle glaucoma, bilateral, mild stage: Secondary | ICD-10-CM | POA: Diagnosis not present

## 2017-06-14 DIAGNOSIS — H52203 Unspecified astigmatism, bilateral: Secondary | ICD-10-CM | POA: Diagnosis not present

## 2017-06-14 DIAGNOSIS — E1142 Type 2 diabetes mellitus with diabetic polyneuropathy: Secondary | ICD-10-CM | POA: Diagnosis not present

## 2017-06-14 DIAGNOSIS — J449 Chronic obstructive pulmonary disease, unspecified: Secondary | ICD-10-CM | POA: Diagnosis not present

## 2017-06-14 DIAGNOSIS — M545 Low back pain: Secondary | ICD-10-CM | POA: Diagnosis not present

## 2017-06-14 DIAGNOSIS — D631 Anemia in chronic kidney disease: Secondary | ICD-10-CM | POA: Diagnosis not present

## 2017-06-14 DIAGNOSIS — H524 Presbyopia: Secondary | ICD-10-CM | POA: Diagnosis not present

## 2017-06-14 DIAGNOSIS — M479 Spondylosis, unspecified: Secondary | ICD-10-CM | POA: Diagnosis not present

## 2017-06-14 DIAGNOSIS — N183 Chronic kidney disease, stage 3 (moderate): Secondary | ICD-10-CM | POA: Diagnosis not present

## 2017-06-14 DIAGNOSIS — Z961 Presence of intraocular lens: Secondary | ICD-10-CM | POA: Diagnosis not present

## 2017-06-14 LAB — HM DIABETES EYE EXAM

## 2017-06-15 ENCOUNTER — Other Ambulatory Visit: Payer: Self-pay | Admitting: Nurse Practitioner

## 2017-06-15 DIAGNOSIS — J441 Chronic obstructive pulmonary disease with (acute) exacerbation: Secondary | ICD-10-CM

## 2017-06-15 NOTE — Telephone Encounter (Signed)
I called and spoke with pharmacist, rx was received yesterday

## 2017-06-16 ENCOUNTER — Telehealth: Payer: Self-pay | Admitting: *Deleted

## 2017-06-16 DIAGNOSIS — J449 Chronic obstructive pulmonary disease, unspecified: Secondary | ICD-10-CM | POA: Diagnosis not present

## 2017-06-16 DIAGNOSIS — D631 Anemia in chronic kidney disease: Secondary | ICD-10-CM | POA: Diagnosis not present

## 2017-06-16 DIAGNOSIS — E1122 Type 2 diabetes mellitus with diabetic chronic kidney disease: Secondary | ICD-10-CM | POA: Diagnosis not present

## 2017-06-16 DIAGNOSIS — S43429A Sprain of unspecified rotator cuff capsule, initial encounter: Secondary | ICD-10-CM | POA: Diagnosis not present

## 2017-06-16 DIAGNOSIS — I130 Hypertensive heart and chronic kidney disease with heart failure and stage 1 through stage 4 chronic kidney disease, or unspecified chronic kidney disease: Secondary | ICD-10-CM | POA: Diagnosis not present

## 2017-06-16 DIAGNOSIS — M1990 Unspecified osteoarthritis, unspecified site: Secondary | ICD-10-CM | POA: Diagnosis not present

## 2017-06-16 DIAGNOSIS — I5032 Chronic diastolic (congestive) heart failure: Secondary | ICD-10-CM | POA: Diagnosis not present

## 2017-06-16 DIAGNOSIS — R600 Localized edema: Secondary | ICD-10-CM | POA: Diagnosis not present

## 2017-06-16 DIAGNOSIS — E1142 Type 2 diabetes mellitus with diabetic polyneuropathy: Secondary | ICD-10-CM | POA: Diagnosis not present

## 2017-06-16 DIAGNOSIS — M48061 Spinal stenosis, lumbar region without neurogenic claudication: Secondary | ICD-10-CM | POA: Diagnosis not present

## 2017-06-16 DIAGNOSIS — M479 Spondylosis, unspecified: Secondary | ICD-10-CM | POA: Diagnosis not present

## 2017-06-16 DIAGNOSIS — E1151 Type 2 diabetes mellitus with diabetic peripheral angiopathy without gangrene: Secondary | ICD-10-CM | POA: Diagnosis not present

## 2017-06-16 DIAGNOSIS — M545 Low back pain: Secondary | ICD-10-CM | POA: Diagnosis not present

## 2017-06-16 DIAGNOSIS — N183 Chronic kidney disease, stage 3 (moderate): Secondary | ICD-10-CM | POA: Diagnosis not present

## 2017-06-16 NOTE — Telephone Encounter (Signed)
Has she taken her medication today? This is abnormal for her. To notify us if this continues we will need to adjust medications.

## 2017-06-16 NOTE — Telephone Encounter (Signed)
Spoke with Michelle Buck and patient had just taken her medications. Nurse stated that she will keep check on it and keep [place] informed.

## 2017-06-16 NOTE — Telephone Encounter (Signed)
Jennie with [facility] called and stated that patient's BP is up and Pulse is low.  BP:187/80 P:46-52 R:NL  T:NL  Blood sugar is good. Patient feels tired/fatigue. Nurse is just letting you know.

## 2017-06-21 ENCOUNTER — Other Ambulatory Visit: Payer: Self-pay

## 2017-06-21 ENCOUNTER — Encounter: Payer: Self-pay | Admitting: Pharmacotherapy

## 2017-06-21 ENCOUNTER — Ambulatory Visit (INDEPENDENT_AMBULATORY_CARE_PROVIDER_SITE_OTHER): Payer: Medicare HMO | Admitting: Pharmacotherapy

## 2017-06-21 ENCOUNTER — Telehealth: Payer: Self-pay

## 2017-06-21 VITALS — BP 116/78 | HR 65 | Temp 98.5°F | Ht 62.0 in | Wt 211.0 lb

## 2017-06-21 DIAGNOSIS — E1121 Type 2 diabetes mellitus with diabetic nephropathy: Secondary | ICD-10-CM | POA: Diagnosis not present

## 2017-06-21 DIAGNOSIS — N184 Chronic kidney disease, stage 4 (severe): Secondary | ICD-10-CM | POA: Diagnosis not present

## 2017-06-21 DIAGNOSIS — I10 Essential (primary) hypertension: Secondary | ICD-10-CM | POA: Diagnosis not present

## 2017-06-21 DIAGNOSIS — Z794 Long term (current) use of insulin: Secondary | ICD-10-CM | POA: Diagnosis not present

## 2017-06-21 DIAGNOSIS — J449 Chronic obstructive pulmonary disease, unspecified: Secondary | ICD-10-CM | POA: Diagnosis not present

## 2017-06-21 LAB — COMPLETE METABOLIC PANEL WITH GFR
ALT: 8 U/L (ref 6–29)
AST: 14 U/L (ref 10–35)
Albumin: 3.7 g/dL (ref 3.6–5.1)
Alkaline Phosphatase: 90 U/L (ref 33–130)
BUN: 18 mg/dL (ref 7–25)
CO2: 31 mmol/L (ref 20–31)
Calcium: 9.2 mg/dL (ref 8.6–10.4)
Chloride: 102 mmol/L (ref 98–110)
Creat: 2 mg/dL — ABNORMAL HIGH (ref 0.60–0.93)
GFR, Est African American: 27 mL/min — ABNORMAL LOW (ref >=60)
GFR, Est Non African American: 23 mL/min — ABNORMAL LOW (ref >=60)
Glucose, Bld: 74 mg/dL (ref 65–99)
Potassium: 3.4 mmol/L — ABNORMAL LOW (ref 3.5–5.3)
Sodium: 143 mmol/L (ref 135–146)
Total Bilirubin: 0.3 mg/dL (ref 0.2–1.2)
Total Protein: 6.8 g/dL (ref 6.1–8.1)

## 2017-06-21 NOTE — Progress Notes (Signed)
  Subjective:    Michelle Buck is a 81 y.o.African American female who presents for follow-up of Type 2 diabetes mellitus.   Last A1C: 6.8% (03/15/17)  She ran out of hydrocodone and is taking Aleve as needed for pain.  Did not bring blood glucose meter. She has had an upset stomach, leg pain x 1 week. She says her average BG: 110-120 range No hypoglycemia (<14m/dl) Continues 70/30 insulin 40 units AM, 25 units PM  Denies problems with vision.  Had eye exam last week. Pain in feet. Some peripheral edema Nocturia twice per night No dysuria Staying well hydrated.  Making healthy food choices. No routine exercise.  Review of Systems A comprehensive review of systems was negative except for: Constitutional: positive for malaise Cardiovascular: positive for lower extremity edema Genitourinary: positive for nocturia Musculoskeletal: positive for arthralgias    Objective:    BP 116/78   Pulse 65   Temp 98.5 F (36.9 C) (Oral)   Ht 5' 2"$  (1.575 m)   Wt 211 lb (95.7 kg)   SpO2 92%   BMI 38.59 kg/m   General:  alert, cooperative and no distress  Oropharynx: normal findings: lips normal without lesions and gums healthy   Eyes:  negative findings: lids and lashes normal and conjunctivae and sclerae normal   Ears:  external ears normal        Lung: clear to auscultation bilaterally  Heart:  regular rate and rhythm     Extremities: edema bilateral lower extremities  Skin: dry     Neuro: mental status, speech normal, alert and oriented x3   Lab Review Glucose, Bld (mg/dL)  Date Value  05/17/2017 117 (H)  05/07/2017 83  05/06/2017 201 (H)   CO2 (mmol/L)  Date Value  05/17/2017 25  05/07/2017 31  05/06/2017 27   BUN (mg/dL)  Date Value  05/17/2017 39 (H)  05/07/2017 26 (H)  05/06/2017 23 (H)  02/26/2016 27  01/07/2016 21  10/15/2015 27   Creat (mg/dL)  Date Value  05/17/2017 2.68 (H)  03/15/2017 2.22 (H)  12/14/2016 2.64 (H)   Creatinine, Ser (mg/dL)   Date Value  05/07/2017 2.60 (H)  05/06/2017 2.68 (H)  05/03/2017 2.35 (H)       Assessment:    Diabetes Mellitus type II, under good control. Last A1C at goal <7%.  Will check again today. BP at goal <130/80 CKD - nephrologist next month   Plan:    1.  Rx changes: none  2.  Continue 70/30 insulin 40 units AM, 25 units PM 3.  Counseled on nutrition goals. 4.  Counseled on importance of routine exercise.  Goal is 30-45 minutes 5 x week. 5.  BP at goal <130/80 6.  CKD - stable.

## 2017-06-21 NOTE — Telephone Encounter (Signed)
Patient is requesting a fentanyl patch, patient was taking aleve and it causes stomach discomfort (takes with food) . Patient stopped taking aleve

## 2017-06-21 NOTE — Telephone Encounter (Signed)
Patient ran out of Hydrocodone last Wednesday and questions if something can be prescribed to hold her over until rx is due again on 06/27/17.  Patient takes medication more than prescribed due to increased pain (reason ran out early)   Patient takes medication for back and leg pain   PCP is out of office, will send to covering provider   Dr.Reed please advise

## 2017-06-21 NOTE — Telephone Encounter (Signed)
I spoke with patient to inform her of provider response. At first she stated that she was only taking medication as prescribed, but when I asked how she was out of medication 2 weeks early, she stated that she was taking it every 4 hours due to increased pain. Patient again asked that medication be prescribed early, so I explained provider response again. She said thank you and hung up.

## 2017-06-21 NOTE — Patient Instructions (Signed)
Keep up the good work

## 2017-06-21 NOTE — Telephone Encounter (Signed)
I recommend she apply the voltaren gel to her lower back until she can get the hydrocodone refilled.  Her kidney function greatly limits pain med options.  She should be taking her medication as prescribed only.  We can refer her to pain management.

## 2017-06-22 LAB — HEMOGLOBIN A1C
Hgb A1c MFr Bld: 6.8 % — ABNORMAL HIGH (ref <5.7)
Mean Plasma Glucose: 148 mg/dL

## 2017-06-23 DIAGNOSIS — I130 Hypertensive heart and chronic kidney disease with heart failure and stage 1 through stage 4 chronic kidney disease, or unspecified chronic kidney disease: Secondary | ICD-10-CM | POA: Diagnosis not present

## 2017-06-23 DIAGNOSIS — E1151 Type 2 diabetes mellitus with diabetic peripheral angiopathy without gangrene: Secondary | ICD-10-CM | POA: Diagnosis not present

## 2017-06-23 DIAGNOSIS — I5032 Chronic diastolic (congestive) heart failure: Secondary | ICD-10-CM | POA: Diagnosis not present

## 2017-06-23 DIAGNOSIS — M479 Spondylosis, unspecified: Secondary | ICD-10-CM | POA: Diagnosis not present

## 2017-06-23 DIAGNOSIS — M545 Low back pain: Secondary | ICD-10-CM | POA: Diagnosis not present

## 2017-06-23 DIAGNOSIS — N183 Chronic kidney disease, stage 3 (moderate): Secondary | ICD-10-CM | POA: Diagnosis not present

## 2017-06-23 DIAGNOSIS — D631 Anemia in chronic kidney disease: Secondary | ICD-10-CM | POA: Diagnosis not present

## 2017-06-23 DIAGNOSIS — E1122 Type 2 diabetes mellitus with diabetic chronic kidney disease: Secondary | ICD-10-CM | POA: Diagnosis not present

## 2017-06-23 DIAGNOSIS — E1142 Type 2 diabetes mellitus with diabetic polyneuropathy: Secondary | ICD-10-CM | POA: Diagnosis not present

## 2017-06-23 DIAGNOSIS — J449 Chronic obstructive pulmonary disease, unspecified: Secondary | ICD-10-CM | POA: Diagnosis not present

## 2017-06-24 DIAGNOSIS — E1142 Type 2 diabetes mellitus with diabetic polyneuropathy: Secondary | ICD-10-CM | POA: Diagnosis not present

## 2017-06-24 DIAGNOSIS — J449 Chronic obstructive pulmonary disease, unspecified: Secondary | ICD-10-CM | POA: Diagnosis not present

## 2017-06-24 DIAGNOSIS — E1151 Type 2 diabetes mellitus with diabetic peripheral angiopathy without gangrene: Secondary | ICD-10-CM | POA: Diagnosis not present

## 2017-06-24 DIAGNOSIS — I130 Hypertensive heart and chronic kidney disease with heart failure and stage 1 through stage 4 chronic kidney disease, or unspecified chronic kidney disease: Secondary | ICD-10-CM | POA: Diagnosis not present

## 2017-06-24 DIAGNOSIS — E1122 Type 2 diabetes mellitus with diabetic chronic kidney disease: Secondary | ICD-10-CM | POA: Diagnosis not present

## 2017-06-24 DIAGNOSIS — I5032 Chronic diastolic (congestive) heart failure: Secondary | ICD-10-CM | POA: Diagnosis not present

## 2017-06-24 DIAGNOSIS — D631 Anemia in chronic kidney disease: Secondary | ICD-10-CM | POA: Diagnosis not present

## 2017-06-24 DIAGNOSIS — M479 Spondylosis, unspecified: Secondary | ICD-10-CM | POA: Diagnosis not present

## 2017-06-24 DIAGNOSIS — N183 Chronic kidney disease, stage 3 (moderate): Secondary | ICD-10-CM | POA: Diagnosis not present

## 2017-06-24 DIAGNOSIS — M545 Low back pain: Secondary | ICD-10-CM | POA: Diagnosis not present

## 2017-06-28 ENCOUNTER — Ambulatory Visit (INDEPENDENT_AMBULATORY_CARE_PROVIDER_SITE_OTHER): Payer: Medicare HMO | Admitting: Nurse Practitioner

## 2017-06-28 ENCOUNTER — Encounter: Payer: Self-pay | Admitting: Nurse Practitioner

## 2017-06-28 ENCOUNTER — Ambulatory Visit
Admission: RE | Admit: 2017-06-28 | Discharge: 2017-06-28 | Disposition: A | Discharge status: Home / Self Care | Payer: Medicare HMO | Source: Ambulatory Visit | Attending: Nurse Practitioner | Admitting: Nurse Practitioner

## 2017-06-28 VITALS — BP 122/74 | HR 61 | Temp 98.1°F | Resp 17

## 2017-06-28 DIAGNOSIS — S99911A Unspecified injury of right ankle, initial encounter: Secondary | ICD-10-CM | POA: Diagnosis not present

## 2017-06-28 DIAGNOSIS — M479 Spondylosis, unspecified: Secondary | ICD-10-CM | POA: Diagnosis not present

## 2017-06-28 DIAGNOSIS — J449 Chronic obstructive pulmonary disease, unspecified: Secondary | ICD-10-CM

## 2017-06-28 DIAGNOSIS — D631 Anemia in chronic kidney disease: Secondary | ICD-10-CM | POA: Diagnosis not present

## 2017-06-28 DIAGNOSIS — Z794 Long term (current) use of insulin: Secondary | ICD-10-CM | POA: Diagnosis not present

## 2017-06-28 DIAGNOSIS — E1151 Type 2 diabetes mellitus with diabetic peripheral angiopathy without gangrene: Secondary | ICD-10-CM | POA: Diagnosis not present

## 2017-06-28 DIAGNOSIS — E1122 Type 2 diabetes mellitus with diabetic chronic kidney disease: Secondary | ICD-10-CM | POA: Diagnosis not present

## 2017-06-28 DIAGNOSIS — R269 Unspecified abnormalities of gait and mobility: Secondary | ICD-10-CM | POA: Diagnosis not present

## 2017-06-28 DIAGNOSIS — G8929 Other chronic pain: Secondary | ICD-10-CM | POA: Diagnosis not present

## 2017-06-28 DIAGNOSIS — M545 Low back pain, unspecified: Secondary | ICD-10-CM

## 2017-06-28 DIAGNOSIS — I5032 Chronic diastolic (congestive) heart failure: Secondary | ICD-10-CM | POA: Diagnosis not present

## 2017-06-28 DIAGNOSIS — M25571 Pain in right ankle and joints of right foot: Secondary | ICD-10-CM | POA: Diagnosis not present

## 2017-06-28 DIAGNOSIS — I130 Hypertensive heart and chronic kidney disease with heart failure and stage 1 through stage 4 chronic kidney disease, or unspecified chronic kidney disease: Secondary | ICD-10-CM | POA: Diagnosis not present

## 2017-06-28 DIAGNOSIS — N183 Chronic kidney disease, stage 3 (moderate): Secondary | ICD-10-CM | POA: Diagnosis not present

## 2017-06-28 DIAGNOSIS — E1121 Type 2 diabetes mellitus with diabetic nephropathy: Secondary | ICD-10-CM

## 2017-06-28 DIAGNOSIS — E1142 Type 2 diabetes mellitus with diabetic polyneuropathy: Secondary | ICD-10-CM | POA: Diagnosis not present

## 2017-06-28 DIAGNOSIS — R69 Illness, unspecified: Secondary | ICD-10-CM | POA: Diagnosis not present

## 2017-06-28 MED ORDER — ALBUTEROL SULFATE HFA 108 (90 BASE) MCG/ACT IN AERS: 2.0000 | INHALATION_SPRAY | RESPIRATORY_TRACT | 3 refills | Status: DC | PRN

## 2017-06-28 MED ORDER — HYDROCODONE-ACETAMINOPHEN 10-325 MG PO TABS: 1.0000 | ORAL_TABLET | Freq: Four times a day (QID) | ORAL | 0 refills | Status: DC | PRN

## 2017-06-28 MED ORDER — BUDESONIDE 0.25 MG/2ML IN SUSP: RESPIRATORY_TRACT | 6 refills | Status: DC

## 2017-06-28 NOTE — Patient Instructions (Signed)
To go to get Xray Pain medication refilled. Make sure you are locking this medication up to avoid someone else taking it.  To use ice to ankle May need to get brace or orthopedic referral  Elevate ankle as tolerate
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
## 2017-06-30 DIAGNOSIS — D631 Anemia in chronic kidney disease: Secondary | ICD-10-CM | POA: Diagnosis not present

## 2017-06-30 DIAGNOSIS — I130 Hypertensive heart and chronic kidney disease with heart failure and stage 1 through stage 4 chronic kidney disease, or unspecified chronic kidney disease: Secondary | ICD-10-CM | POA: Diagnosis not present

## 2017-06-30 DIAGNOSIS — J449 Chronic obstructive pulmonary disease, unspecified: Secondary | ICD-10-CM | POA: Diagnosis not present

## 2017-06-30 DIAGNOSIS — E1122 Type 2 diabetes mellitus with diabetic chronic kidney disease: Secondary | ICD-10-CM | POA: Diagnosis not present

## 2017-06-30 DIAGNOSIS — M545 Low back pain: Secondary | ICD-10-CM | POA: Diagnosis not present

## 2017-06-30 DIAGNOSIS — E1151 Type 2 diabetes mellitus with diabetic peripheral angiopathy without gangrene: Secondary | ICD-10-CM | POA: Diagnosis not present

## 2017-06-30 DIAGNOSIS — E1142 Type 2 diabetes mellitus with diabetic polyneuropathy: Secondary | ICD-10-CM | POA: Diagnosis not present

## 2017-06-30 DIAGNOSIS — N183 Chronic kidney disease, stage 3 (moderate): Secondary | ICD-10-CM | POA: Diagnosis not present

## 2017-06-30 DIAGNOSIS — I5032 Chronic diastolic (congestive) heart failure: Secondary | ICD-10-CM | POA: Diagnosis not present

## 2017-06-30 DIAGNOSIS — M479 Spondylosis, unspecified: Secondary | ICD-10-CM | POA: Diagnosis not present

## 2017-07-02 DIAGNOSIS — I130 Hypertensive heart and chronic kidney disease with heart failure and stage 1 through stage 4 chronic kidney disease, or unspecified chronic kidney disease: Secondary | ICD-10-CM | POA: Diagnosis not present

## 2017-07-02 DIAGNOSIS — M545 Low back pain: Secondary | ICD-10-CM | POA: Diagnosis not present

## 2017-07-02 DIAGNOSIS — E1142 Type 2 diabetes mellitus with diabetic polyneuropathy: Secondary | ICD-10-CM | POA: Diagnosis not present

## 2017-07-02 DIAGNOSIS — E1122 Type 2 diabetes mellitus with diabetic chronic kidney disease: Secondary | ICD-10-CM | POA: Diagnosis not present

## 2017-07-02 DIAGNOSIS — E1151 Type 2 diabetes mellitus with diabetic peripheral angiopathy without gangrene: Secondary | ICD-10-CM | POA: Diagnosis not present

## 2017-07-02 DIAGNOSIS — D631 Anemia in chronic kidney disease: Secondary | ICD-10-CM | POA: Diagnosis not present

## 2017-07-02 DIAGNOSIS — J449 Chronic obstructive pulmonary disease, unspecified: Secondary | ICD-10-CM | POA: Diagnosis not present

## 2017-07-02 DIAGNOSIS — M479 Spondylosis, unspecified: Secondary | ICD-10-CM | POA: Diagnosis not present

## 2017-07-02 DIAGNOSIS — N183 Chronic kidney disease, stage 3 (moderate): Secondary | ICD-10-CM | POA: Diagnosis not present

## 2017-07-02 DIAGNOSIS — I5032 Chronic diastolic (congestive) heart failure: Secondary | ICD-10-CM | POA: Diagnosis not present

## 2017-07-02 MED ORDER — INSULIN SYRINGE-NEEDLE U-100 31G X 5/16" 0.5 ML MISC
3 refills | Status: DC
Start: 2017-07-02 — End: 2017-07-12

## 2017-07-06 DIAGNOSIS — D631 Anemia in chronic kidney disease: Secondary | ICD-10-CM | POA: Diagnosis not present

## 2017-07-06 DIAGNOSIS — I130 Hypertensive heart and chronic kidney disease with heart failure and stage 1 through stage 4 chronic kidney disease, or unspecified chronic kidney disease: Secondary | ICD-10-CM | POA: Diagnosis not present

## 2017-07-06 DIAGNOSIS — N183 Chronic kidney disease, stage 3 (moderate): Secondary | ICD-10-CM | POA: Diagnosis not present

## 2017-07-06 DIAGNOSIS — E1142 Type 2 diabetes mellitus with diabetic polyneuropathy: Secondary | ICD-10-CM | POA: Diagnosis not present

## 2017-07-06 DIAGNOSIS — M479 Spondylosis, unspecified: Secondary | ICD-10-CM | POA: Diagnosis not present

## 2017-07-06 DIAGNOSIS — M545 Low back pain: Secondary | ICD-10-CM | POA: Diagnosis not present

## 2017-07-06 DIAGNOSIS — E1122 Type 2 diabetes mellitus with diabetic chronic kidney disease: Secondary | ICD-10-CM | POA: Diagnosis not present

## 2017-07-06 DIAGNOSIS — E1151 Type 2 diabetes mellitus with diabetic peripheral angiopathy without gangrene: Secondary | ICD-10-CM | POA: Diagnosis not present

## 2017-07-06 DIAGNOSIS — I5032 Chronic diastolic (congestive) heart failure: Secondary | ICD-10-CM | POA: Diagnosis not present

## 2017-07-06 DIAGNOSIS — J449 Chronic obstructive pulmonary disease, unspecified: Secondary | ICD-10-CM | POA: Diagnosis not present

## 2017-07-07 ENCOUNTER — Other Ambulatory Visit: Payer: Self-pay | Admitting: Internal Medicine

## 2017-07-07 DIAGNOSIS — N183 Chronic kidney disease, stage 3 (moderate): Secondary | ICD-10-CM | POA: Diagnosis not present

## 2017-07-07 DIAGNOSIS — E1142 Type 2 diabetes mellitus with diabetic polyneuropathy: Secondary | ICD-10-CM | POA: Diagnosis not present

## 2017-07-07 DIAGNOSIS — E1151 Type 2 diabetes mellitus with diabetic peripheral angiopathy without gangrene: Secondary | ICD-10-CM | POA: Diagnosis not present

## 2017-07-07 DIAGNOSIS — M479 Spondylosis, unspecified: Secondary | ICD-10-CM | POA: Diagnosis not present

## 2017-07-07 DIAGNOSIS — I5032 Chronic diastolic (congestive) heart failure: Secondary | ICD-10-CM | POA: Diagnosis not present

## 2017-07-07 DIAGNOSIS — I130 Hypertensive heart and chronic kidney disease with heart failure and stage 1 through stage 4 chronic kidney disease, or unspecified chronic kidney disease: Secondary | ICD-10-CM | POA: Diagnosis not present

## 2017-07-07 DIAGNOSIS — D631 Anemia in chronic kidney disease: Secondary | ICD-10-CM | POA: Diagnosis not present

## 2017-07-07 DIAGNOSIS — J449 Chronic obstructive pulmonary disease, unspecified: Secondary | ICD-10-CM | POA: Diagnosis not present

## 2017-07-07 DIAGNOSIS — E1122 Type 2 diabetes mellitus with diabetic chronic kidney disease: Secondary | ICD-10-CM | POA: Diagnosis not present

## 2017-07-07 DIAGNOSIS — M545 Low back pain: Secondary | ICD-10-CM | POA: Diagnosis not present

## 2017-07-09 ENCOUNTER — Other Ambulatory Visit: Payer: Self-pay | Admitting: Nurse Practitioner

## 2017-07-09 DIAGNOSIS — J449 Chronic obstructive pulmonary disease, unspecified: Secondary | ICD-10-CM | POA: Diagnosis not present

## 2017-07-09 DIAGNOSIS — I130 Hypertensive heart and chronic kidney disease with heart failure and stage 1 through stage 4 chronic kidney disease, or unspecified chronic kidney disease: Secondary | ICD-10-CM | POA: Diagnosis not present

## 2017-07-09 DIAGNOSIS — E1122 Type 2 diabetes mellitus with diabetic chronic kidney disease: Secondary | ICD-10-CM | POA: Diagnosis not present

## 2017-07-09 DIAGNOSIS — I5032 Chronic diastolic (congestive) heart failure: Secondary | ICD-10-CM | POA: Diagnosis not present

## 2017-07-09 DIAGNOSIS — N183 Chronic kidney disease, stage 3 (moderate): Secondary | ICD-10-CM | POA: Diagnosis not present

## 2017-07-09 DIAGNOSIS — E1142 Type 2 diabetes mellitus with diabetic polyneuropathy: Secondary | ICD-10-CM | POA: Diagnosis not present

## 2017-07-09 DIAGNOSIS — D631 Anemia in chronic kidney disease: Secondary | ICD-10-CM | POA: Diagnosis not present

## 2017-07-09 DIAGNOSIS — E1151 Type 2 diabetes mellitus with diabetic peripheral angiopathy without gangrene: Secondary | ICD-10-CM | POA: Diagnosis not present

## 2017-07-09 DIAGNOSIS — M479 Spondylosis, unspecified: Secondary | ICD-10-CM | POA: Diagnosis not present

## 2017-07-09 DIAGNOSIS — M545 Low back pain: Secondary | ICD-10-CM | POA: Diagnosis not present

## 2017-07-12 DIAGNOSIS — G4733 Obstructive sleep apnea (adult) (pediatric): Secondary | ICD-10-CM | POA: Diagnosis not present

## 2017-07-14 DIAGNOSIS — I5032 Chronic diastolic (congestive) heart failure: Secondary | ICD-10-CM | POA: Diagnosis not present

## 2017-07-14 DIAGNOSIS — J449 Chronic obstructive pulmonary disease, unspecified: Secondary | ICD-10-CM | POA: Diagnosis not present

## 2017-07-14 DIAGNOSIS — E1151 Type 2 diabetes mellitus with diabetic peripheral angiopathy without gangrene: Secondary | ICD-10-CM | POA: Diagnosis not present

## 2017-07-14 DIAGNOSIS — I130 Hypertensive heart and chronic kidney disease with heart failure and stage 1 through stage 4 chronic kidney disease, or unspecified chronic kidney disease: Secondary | ICD-10-CM | POA: Diagnosis not present

## 2017-07-14 DIAGNOSIS — M545 Low back pain: Secondary | ICD-10-CM | POA: Diagnosis not present

## 2017-07-14 DIAGNOSIS — N183 Chronic kidney disease, stage 3 (moderate): Secondary | ICD-10-CM | POA: Diagnosis not present

## 2017-07-14 DIAGNOSIS — M479 Spondylosis, unspecified: Secondary | ICD-10-CM | POA: Diagnosis not present

## 2017-07-14 DIAGNOSIS — E1142 Type 2 diabetes mellitus with diabetic polyneuropathy: Secondary | ICD-10-CM | POA: Diagnosis not present

## 2017-07-14 DIAGNOSIS — E1122 Type 2 diabetes mellitus with diabetic chronic kidney disease: Secondary | ICD-10-CM | POA: Diagnosis not present

## 2017-07-14 DIAGNOSIS — D631 Anemia in chronic kidney disease: Secondary | ICD-10-CM | POA: Diagnosis not present

## 2017-07-16 DIAGNOSIS — M545 Low back pain: Secondary | ICD-10-CM | POA: Diagnosis not present

## 2017-07-16 DIAGNOSIS — D631 Anemia in chronic kidney disease: Secondary | ICD-10-CM | POA: Diagnosis not present

## 2017-07-16 DIAGNOSIS — I130 Hypertensive heart and chronic kidney disease with heart failure and stage 1 through stage 4 chronic kidney disease, or unspecified chronic kidney disease: Secondary | ICD-10-CM | POA: Diagnosis not present

## 2017-07-16 DIAGNOSIS — M479 Spondylosis, unspecified: Secondary | ICD-10-CM | POA: Diagnosis not present

## 2017-07-16 DIAGNOSIS — J449 Chronic obstructive pulmonary disease, unspecified: Secondary | ICD-10-CM | POA: Diagnosis not present

## 2017-07-16 DIAGNOSIS — E1151 Type 2 diabetes mellitus with diabetic peripheral angiopathy without gangrene: Secondary | ICD-10-CM | POA: Diagnosis not present

## 2017-07-16 DIAGNOSIS — N183 Chronic kidney disease, stage 3 (moderate): Secondary | ICD-10-CM | POA: Diagnosis not present

## 2017-07-16 DIAGNOSIS — I5032 Chronic diastolic (congestive) heart failure: Secondary | ICD-10-CM | POA: Diagnosis not present

## 2017-07-16 DIAGNOSIS — E1142 Type 2 diabetes mellitus with diabetic polyneuropathy: Secondary | ICD-10-CM | POA: Diagnosis not present

## 2017-07-16 DIAGNOSIS — E1122 Type 2 diabetes mellitus with diabetic chronic kidney disease: Secondary | ICD-10-CM | POA: Diagnosis not present

## 2017-07-19 DIAGNOSIS — M479 Spondylosis, unspecified: Secondary | ICD-10-CM | POA: Diagnosis not present

## 2017-07-19 DIAGNOSIS — E1151 Type 2 diabetes mellitus with diabetic peripheral angiopathy without gangrene: Secondary | ICD-10-CM | POA: Diagnosis not present

## 2017-07-19 DIAGNOSIS — J449 Chronic obstructive pulmonary disease, unspecified: Secondary | ICD-10-CM | POA: Diagnosis not present

## 2017-07-19 DIAGNOSIS — E1142 Type 2 diabetes mellitus with diabetic polyneuropathy: Secondary | ICD-10-CM | POA: Diagnosis not present

## 2017-07-19 DIAGNOSIS — I130 Hypertensive heart and chronic kidney disease with heart failure and stage 1 through stage 4 chronic kidney disease, or unspecified chronic kidney disease: Secondary | ICD-10-CM | POA: Diagnosis not present

## 2017-07-19 DIAGNOSIS — M545 Low back pain: Secondary | ICD-10-CM | POA: Diagnosis not present

## 2017-07-19 DIAGNOSIS — I5032 Chronic diastolic (congestive) heart failure: Secondary | ICD-10-CM | POA: Diagnosis not present

## 2017-07-19 DIAGNOSIS — E1122 Type 2 diabetes mellitus with diabetic chronic kidney disease: Secondary | ICD-10-CM | POA: Diagnosis not present

## 2017-07-19 DIAGNOSIS — D631 Anemia in chronic kidney disease: Secondary | ICD-10-CM | POA: Diagnosis not present

## 2017-07-19 DIAGNOSIS — N183 Chronic kidney disease, stage 3 (moderate): Secondary | ICD-10-CM | POA: Diagnosis not present

## 2017-07-21 DIAGNOSIS — D631 Anemia in chronic kidney disease: Secondary | ICD-10-CM | POA: Diagnosis not present

## 2017-07-21 DIAGNOSIS — E1151 Type 2 diabetes mellitus with diabetic peripheral angiopathy without gangrene: Secondary | ICD-10-CM | POA: Diagnosis not present

## 2017-07-21 DIAGNOSIS — I130 Hypertensive heart and chronic kidney disease with heart failure and stage 1 through stage 4 chronic kidney disease, or unspecified chronic kidney disease: Secondary | ICD-10-CM | POA: Diagnosis not present

## 2017-07-21 DIAGNOSIS — I5032 Chronic diastolic (congestive) heart failure: Secondary | ICD-10-CM | POA: Diagnosis not present

## 2017-07-21 DIAGNOSIS — N183 Chronic kidney disease, stage 3 (moderate): Secondary | ICD-10-CM | POA: Diagnosis not present

## 2017-07-21 DIAGNOSIS — M545 Low back pain: Secondary | ICD-10-CM | POA: Diagnosis not present

## 2017-07-21 DIAGNOSIS — E1122 Type 2 diabetes mellitus with diabetic chronic kidney disease: Secondary | ICD-10-CM | POA: Diagnosis not present

## 2017-07-21 DIAGNOSIS — E1142 Type 2 diabetes mellitus with diabetic polyneuropathy: Secondary | ICD-10-CM | POA: Diagnosis not present

## 2017-07-21 DIAGNOSIS — J449 Chronic obstructive pulmonary disease, unspecified: Secondary | ICD-10-CM | POA: Diagnosis not present

## 2017-07-21 DIAGNOSIS — M479 Spondylosis, unspecified: Secondary | ICD-10-CM | POA: Diagnosis not present

## 2017-07-28 ENCOUNTER — Other Ambulatory Visit: Payer: Self-pay | Admitting: *Deleted

## 2017-07-28 DIAGNOSIS — G8929 Other chronic pain: Secondary | ICD-10-CM

## 2017-07-28 DIAGNOSIS — M545 Low back pain, unspecified: Secondary | ICD-10-CM

## 2017-07-28 DIAGNOSIS — M25571 Pain in right ankle and joints of right foot: Secondary | ICD-10-CM

## 2017-07-28 MED ORDER — HYDROCODONE-ACETAMINOPHEN 10-325 MG PO TABS: 1.0000 | ORAL_TABLET | Freq: Four times a day (QID) | ORAL | 0 refills | Status: DC | PRN

## 2017-07-28 NOTE — Telephone Encounter (Signed)
Patient requested and will pick up [place] Database Checked.  

## 2017-08-05 ENCOUNTER — Ambulatory Visit (INDEPENDENT_AMBULATORY_CARE_PROVIDER_SITE_OTHER): Payer: Medicare HMO | Admitting: Pulmonary Disease

## 2017-08-05 ENCOUNTER — Encounter: Payer: Self-pay | Admitting: Pulmonary Disease

## 2017-08-05 ENCOUNTER — Ambulatory Visit (INDEPENDENT_AMBULATORY_CARE_PROVIDER_SITE_OTHER)
Admission: RE | Admit: 2017-08-05 | Discharge: 2017-08-05 | Disposition: A | Discharge status: Home / Self Care | Payer: Medicare HMO | Source: Ambulatory Visit | Attending: Pulmonary Disease | Admitting: Pulmonary Disease

## 2017-08-05 VITALS — BP 119/68 | HR 63 | Ht 62.0 in | Wt 204.0 lb

## 2017-08-05 DIAGNOSIS — G4733 Obstructive sleep apnea (adult) (pediatric): Secondary | ICD-10-CM | POA: Diagnosis not present

## 2017-08-05 DIAGNOSIS — R079 Chest pain, unspecified: Secondary | ICD-10-CM

## 2017-08-05 DIAGNOSIS — R0789 Other chest pain: Secondary | ICD-10-CM | POA: Diagnosis not present

## 2017-08-05 DIAGNOSIS — J449 Chronic obstructive pulmonary disease, unspecified: Secondary | ICD-10-CM

## 2017-08-05 MED ORDER — BUDESONIDE 0.25 MG/2ML IN SUSP: RESPIRATORY_TRACT | 6 refills | Status: DC

## 2017-08-05 NOTE — Assessment & Plan Note (Signed)
EKG today. Chest x-ray will be obtained

## 2017-08-05 NOTE — Progress Notes (Signed)
   Subjective:    Patient ID: Michelle Buck, female    DOB: May 03, 1936, 81 y.o.   MRN: [id number]  HPI  81 year old diabetic, hypertensive, CK D for FU of OSA  She smoked for about 20 pack years and quit at age 72 and carries a diagnosis of COPD.  She is very deconditioned due to multiple spine and joint issues and uses an ambulatory walker.  She was started on a CPAP machine in 08/2016 and this really helped improve her daytime somnolence and fatigue. She feels like she rests better with the machine. She was placed on a full face mask and her biggest problem is that she cannot raise her arms above her head due to bilateral shoulder issues. When she has difficulty putting the mask on at night, this has affected your CPAP compliance. However when she uses the machine she feels so much better and is convinced that she needs to use it more often.  She also has oxygen blended into her machine. Oxygen saturation is 96% today  She reports left-sided chest pain for the last 2 weeks, nonradiating, nonpleuritic, occurs at rest, not related to exertion. She denies cough, sputum production. She is compliant with her Pulmicort  Chest x-ray 04/2017 was reviewed which showed small right effusion EKG normal today  Significant tests/ events  Spirometry 08/2016 shows moderate restriction with a ratio of 80, FVC 59%, FEV1 62% Echo 02/2012 shows grade 2 diastolic dysfunction  NPSG 08/2016 severe AHI 66/h, CPAP 16 cm  Review of Systems  neg for any significant sore throat, dysphagia, itching, sneezing, nasal congestion or excess/ purulent secretions, fever, chills, sweats, unintended wt loss, pleuritic or exertional cp, hempoptysis, orthopnea pnd or change in chronic leg swelling. Also denies presyncope, palpitations, heartburn, abdominal pain, nausea, vomiting, diarrhea or change in bowel or urinary habits, dysuria,hematuria, rash, arthralgias, visual complaints, headache, numbness weakness or ataxia.       Objective:   Physical Exam  Gen. Pleasant, obese, in no distress ENT - no lesions, no post nasal drip Neck: No JVD, no thyromegaly, no carotid bruits Lungs: no use of accessory muscles, no dullness to percussion, decreased without rales or rhonchi  Cardiovascular: Rhythm regular, heart sounds  normal, no murmurs or gallops, no peripheral edema Musculoskeletal: No deformities, no cyanosis or clubbing , no tremors       Assessment & Plan:

## 2017-08-05 NOTE — Assessment & Plan Note (Signed)
Change CPAP settings to 10-16 cm Send prescription to [facility] to provide new mask of choice which patient can put on by herself -she has bilateral shoulder issues and cannot raise her arms above her head   Weight loss encouraged, compliance with goal of at least 4-6 hrs every night is the expectation. Advised against medications with sedative side effects Cautioned against driving when sleepy - understanding that sleepiness will vary on a day to day basis

## 2017-08-05 NOTE — Patient Instructions (Signed)
Change CPAP settings to 10-16 cm Send prescription to [facility] to provide new mask of choice which patient can put on by herself -she has bilateral shoulder issues and cannot raise her arms above her head  Refills on Pulmicort

## 2017-08-06 ENCOUNTER — Other Ambulatory Visit: Payer: Self-pay | Admitting: *Deleted

## 2017-08-06 ENCOUNTER — Other Ambulatory Visit: Payer: Self-pay | Admitting: Nurse Practitioner

## 2017-08-06 MED ORDER — FUROSEMIDE 20 MG PO TABS: 60.0000 mg | ORAL_TABLET | Freq: Two times a day (BID) | ORAL | 2 refills | Status: DC

## 2017-08-11 DIAGNOSIS — R69 Illness, unspecified: Secondary | ICD-10-CM | POA: Diagnosis not present

## 2017-08-23 ENCOUNTER — Other Ambulatory Visit: Payer: Self-pay | Admitting: Nurse Practitioner

## 2017-08-23 DIAGNOSIS — J441 Chronic obstructive pulmonary disease with (acute) exacerbation: Secondary | ICD-10-CM

## 2017-08-27 ENCOUNTER — Other Ambulatory Visit: Payer: Self-pay

## 2017-08-27 DIAGNOSIS — M545 Low back pain, unspecified: Secondary | ICD-10-CM

## 2017-08-27 DIAGNOSIS — M25571 Pain in right ankle and joints of right foot: Secondary | ICD-10-CM

## 2017-08-27 DIAGNOSIS — G8929 Other chronic pain: Secondary | ICD-10-CM

## 2017-08-27 MED ORDER — HYDROCODONE-ACETAMINOPHEN 10-325 MG PO TABS: 1.0000 | ORAL_TABLET | Freq: Four times a day (QID) | ORAL | 0 refills | Status: DC | PRN

## 2017-08-27 NOTE — Telephone Encounter (Signed)
Prescription printed for hydrocodone/apap 10-325 mg. # 120 with no refills. Rx placed in Dr Vale Haven folder for signing.

## 2017-08-30 ENCOUNTER — Encounter: Payer: Self-pay | Admitting: *Deleted

## 2017-09-01 DIAGNOSIS — M549 Dorsalgia, unspecified: Secondary | ICD-10-CM | POA: Diagnosis not present

## 2017-09-01 DIAGNOSIS — I129 Hypertensive chronic kidney disease with stage 1 through stage 4 chronic kidney disease, or unspecified chronic kidney disease: Secondary | ICD-10-CM | POA: Diagnosis not present

## 2017-09-01 DIAGNOSIS — N184 Chronic kidney disease, stage 4 (severe): Secondary | ICD-10-CM | POA: Diagnosis not present

## 2017-09-01 DIAGNOSIS — R69 Illness, unspecified: Secondary | ICD-10-CM | POA: Diagnosis not present

## 2017-09-01 DIAGNOSIS — Z23 Encounter for immunization: Secondary | ICD-10-CM | POA: Diagnosis not present

## 2017-09-01 DIAGNOSIS — N140 Analgesic nephropathy: Secondary | ICD-10-CM | POA: Diagnosis not present

## 2017-09-03 ENCOUNTER — Other Ambulatory Visit: Payer: Self-pay | Admitting: Nurse Practitioner

## 2017-09-21 ENCOUNTER — Other Ambulatory Visit: Payer: Medicare HMO

## 2017-09-24 DIAGNOSIS — R69 Illness, unspecified: Secondary | ICD-10-CM | POA: Diagnosis not present

## 2017-09-27 ENCOUNTER — Encounter: Payer: Self-pay | Admitting: Pharmacotherapy

## 2017-09-27 ENCOUNTER — Telehealth: Payer: Self-pay

## 2017-09-27 ENCOUNTER — Ambulatory Visit (INDEPENDENT_AMBULATORY_CARE_PROVIDER_SITE_OTHER): Payer: Medicare HMO | Admitting: Pharmacotherapy

## 2017-09-27 VITALS — BP 130/78 | HR 66 | Ht 62.0 in | Wt 180.0 lb

## 2017-09-27 DIAGNOSIS — E1121 Type 2 diabetes mellitus with diabetic nephropathy: Secondary | ICD-10-CM

## 2017-09-27 DIAGNOSIS — M25571 Pain in right ankle and joints of right foot: Secondary | ICD-10-CM

## 2017-09-27 DIAGNOSIS — M545 Low back pain, unspecified: Secondary | ICD-10-CM

## 2017-09-27 DIAGNOSIS — N184 Chronic kidney disease, stage 4 (severe): Secondary | ICD-10-CM | POA: Diagnosis not present

## 2017-09-27 DIAGNOSIS — I10 Essential (primary) hypertension: Secondary | ICD-10-CM

## 2017-09-27 DIAGNOSIS — G8929 Other chronic pain: Secondary | ICD-10-CM

## 2017-09-27 DIAGNOSIS — Z794 Long term (current) use of insulin: Secondary | ICD-10-CM | POA: Diagnosis not present

## 2017-09-27 MED ORDER — HYDROCODONE-ACETAMINOPHEN 10-325 MG PO TABS: 1.0000 | ORAL_TABLET | Freq: Four times a day (QID) | ORAL | 0 refills | Status: DC | PRN

## 2017-09-27 NOTE — Telephone Encounter (Signed)
This is ok

## 2017-09-27 NOTE — Telephone Encounter (Signed)
1.) Patient in office to see [name] and asked if her hydrocodone can be written for brand name only. Patient states the generic is not helping.   Patient is due for refill and would like rx filled today for the generic to avoid being without medication, going forward patient would like brand name only  Please advise

## 2017-09-27 NOTE — Progress Notes (Signed)
Diabetes Follow-Up Visit Chief Complaint:   Chief Complaint  Patient presents with  . Medical Management of Chronic Issues    3 month DM follow-up, here with sister Pearlie   . Diabetes Mellitus  . Hypertension     Exam Edema:  extremities  Polyuria:  yes  Polydipsia:  yes Polyphagia:  yes  BMI:  Body mass index is 32.92 kg/m.   Weight changes:  Down from last visit. General Appearance:  alert, oriented, no acute distress and obese Mood/Affect:  normal  HPI:  Forgot to bring BG meter.  Says average 110-120.  No hypoglycemia. Drinking a lot of coffee and water. Denies problems with vision. Has pain in feet.  No sores or wounds. Nocturia every hour. No dysuria.  Current 70/30:  40 units every morning and 25 units every night.  Low fat/carbohydrate diet?  Yes Nicotine Abuse?  No Medication Compliance?  Yes Exercise?  No Alcohol Abuse?  No  Home BG Monitoring:  Checking 1 times a day. Average:  115  High: 120  Low:  110   Lab Results  Component Value Date   HGBA1C 6.8 (H) 06/21/2017    Lab Results  Component Value Date   MICROALBUR 3.08 (H) 05/14/2010    Lab Results  Component Value Date   CHOL 147 03/14/2015   HDL 41 03/14/2015   LDLCALC 62 03/14/2015   TRIG 219 (H) 03/14/2015   CHOLHDL 3.6 03/14/2015      Assessment: 1.  Diabetes.  Last A1C was excellent -will recheck today.  SMBG values are excellent. 2.  Blood Pressure.  At goal <130/80 3.  Lipids.  Due for a recheck 4.  Foot Care.  Has peripheral neuropathy 5.  Dental Care.  stable 6.  Eye Care/Exam.  stable  Recommendations: 1.  Patient is counseled on appropriate foot care. 2.  BP goal < 130/80. 3.  LDL goal of < 100, HDL > 40 and TG < 150. 4.  Eye Exam yearly and Dental Exam every 6 months. 5.  Dietary recommendations:  Counseled on nutrition goals. 6.  Physical Activity recommendations:  Exercise is limited due to poor mobility. 7.  Medication recommendations at this time are as follows:   Continue current dose of 70/30 insulin at this time 8.  Return to clinic in 3-4 months   Time spent counseling patient:  30 minutes   Referring provider:  Dr. Eulas Post   PharmD:  Tivis Ringer, RPH-CPP

## 2017-09-27 NOTE — Telephone Encounter (Signed)
[place] Database verified and compliance confirmed   

## 2017-09-27 NOTE — Telephone Encounter (Signed)
Patient aware to inform us at next refill request

## 2017-09-27 NOTE — Patient Instructions (Signed)
Try capsacin cream - tiny amount - on  Feet.

## 2017-09-28 ENCOUNTER — Other Ambulatory Visit: Payer: Medicare HMO

## 2017-09-28 DIAGNOSIS — Z794 Long term (current) use of insulin: Secondary | ICD-10-CM | POA: Diagnosis not present

## 2017-09-28 DIAGNOSIS — E1121 Type 2 diabetes mellitus with diabetic nephropathy: Secondary | ICD-10-CM | POA: Diagnosis not present

## 2017-09-29 LAB — COMPLETE METABOLIC PANEL WITH GFR
AG Ratio: 1.3 (calc) (ref 1.0–2.5)
ALT: 8 U/L (ref 6–29)
AST: 16 U/L (ref 10–35)
Albumin: 3.9 g/dL (ref 3.6–5.1)
Alkaline phosphatase (APISO): 91 U/L (ref 33–130)
BUN/Creatinine Ratio: 8 (calc) (ref 6–22)
BUN: 17 mg/dL (ref 7–25)
CO2: 26 mmol/L (ref 20–32)
Calcium: 8.9 mg/dL (ref 8.6–10.4)
Chloride: 104 mmol/L (ref 98–110)
Creat: 2.09 mg/dL — ABNORMAL HIGH (ref 0.60–0.93)
GFR, Est African American: 25 mL/min/{1.73_m2} — ABNORMAL LOW (ref >=60)
GFR, Est Non African American: 22 mL/min/{1.73_m2} — ABNORMAL LOW (ref >=60)
Globulin: 2.9 g/dL (calc) (ref 1.9–3.7)
Glucose, Bld: 67 mg/dL (ref 65–99)
Potassium: 3.7 mmol/L (ref 3.5–5.3)
Sodium: 142 mmol/L (ref 135–146)
Total Bilirubin: 0.2 mg/dL (ref 0.2–1.2)
Total Protein: 6.8 g/dL (ref 6.1–8.1)

## 2017-09-29 LAB — HEMOGLOBIN A1C
Hgb A1c MFr Bld: 6.1 % of total Hgb — ABNORMAL HIGH (ref <5.7)
Mean Plasma Glucose: 128 (calc)
eAG (mmol/L): 7.1 (calc)

## 2017-09-30 DIAGNOSIS — E1121 Type 2 diabetes mellitus with diabetic nephropathy: Secondary | ICD-10-CM | POA: Diagnosis not present

## 2017-09-30 DIAGNOSIS — Z794 Long term (current) use of insulin: Secondary | ICD-10-CM | POA: Diagnosis not present

## 2017-10-01 ENCOUNTER — Other Ambulatory Visit: Payer: Self-pay | Admitting: Nurse Practitioner

## 2017-10-01 LAB — MICROALBUMIN / CREATININE URINE RATIO
Creatinine, Urine: 98 mg/dL (ref 20–275)
Microalb Creat Ratio: 2 mcg/mg creat (ref <30)
Microalb, Ur: 0.2 mg/dL

## 2017-10-06 ENCOUNTER — Telehealth: Payer: Self-pay

## 2017-10-06 ENCOUNTER — Other Ambulatory Visit: Payer: Self-pay | Admitting: Nurse Practitioner

## 2017-10-06 NOTE — Telephone Encounter (Signed)
A fax was received from [facility] regarding therapy duplication of sertraline and duloxetine.   Per Michelle Buck, pt is to stop taking sertraline and that duloxetine can be increased at appointment on 10/11/17 if necessary.   I spoke with Michelle Buck and she verbalized understanding. She stated that she would try to keep a record of how she is feeling over the next few days.

## 2017-10-11 ENCOUNTER — Encounter: Payer: Self-pay | Admitting: Nurse Practitioner

## 2017-10-11 ENCOUNTER — Ambulatory Visit (INDEPENDENT_AMBULATORY_CARE_PROVIDER_SITE_OTHER): Payer: Medicare HMO | Admitting: Nurse Practitioner

## 2017-10-11 VITALS — BP 132/82 | HR 72 | Temp 98.2°F | Resp 18 | Ht 62.0 in | Wt 207.0 lb

## 2017-10-11 DIAGNOSIS — N184 Chronic kidney disease, stage 4 (severe): Secondary | ICD-10-CM

## 2017-10-11 DIAGNOSIS — M5440 Lumbago with sciatica, unspecified side: Secondary | ICD-10-CM | POA: Diagnosis not present

## 2017-10-11 DIAGNOSIS — G8929 Other chronic pain: Secondary | ICD-10-CM

## 2017-10-11 DIAGNOSIS — E785 Hyperlipidemia, unspecified: Secondary | ICD-10-CM | POA: Diagnosis not present

## 2017-10-11 DIAGNOSIS — J449 Chronic obstructive pulmonary disease, unspecified: Secondary | ICD-10-CM

## 2017-10-11 DIAGNOSIS — F329 Major depressive disorder, single episode, unspecified: Secondary | ICD-10-CM

## 2017-10-11 DIAGNOSIS — I10 Essential (primary) hypertension: Secondary | ICD-10-CM

## 2017-10-11 DIAGNOSIS — F419 Anxiety disorder, unspecified: Secondary | ICD-10-CM

## 2017-10-11 DIAGNOSIS — E1121 Type 2 diabetes mellitus with diabetic nephropathy: Secondary | ICD-10-CM | POA: Diagnosis not present

## 2017-10-11 DIAGNOSIS — R69 Illness, unspecified: Secondary | ICD-10-CM | POA: Diagnosis not present

## 2017-10-11 DIAGNOSIS — Z794 Long term (current) use of insulin: Secondary | ICD-10-CM

## 2017-10-11 DIAGNOSIS — I5032 Chronic diastolic (congestive) heart failure: Secondary | ICD-10-CM | POA: Diagnosis not present

## 2017-10-11 DIAGNOSIS — F32A Depression, unspecified: Secondary | ICD-10-CM

## 2017-10-11 DIAGNOSIS — M4316 Spondylolisthesis, lumbar region: Secondary | ICD-10-CM | POA: Diagnosis not present

## 2017-10-11 NOTE — Patient Instructions (Addendum)
MRI ordered for evaluation of spine  Home health physical therapy for pain and strength  Make follow up with Dr Oval Linsey your Cardiologist for follow up.   Make appt for fasting blood work in office   Follow up in 3 months with Dr Eulas Post for physician visit

## 2017-10-11 NOTE — Progress Notes (Signed)
Careteam: Patient Care Team: [name], NP as PCP - General (Nurse Practitioner) [name], MD as Consulting Physician (Ophthalmology) [name], MD as Consulting Physician (Nephrology)  Advanced Directive information Does Patient Have a Medical Advance Directive?: No, Would patient like information on creating a medical advance directive?: No - Patient declined  Allergies  Allergen Reactions  . Tramadol Other (See Comments)    Leg cramps   . Codeine Nausea And Vomiting    Patient states N/V with codeine  . Hydrocodone Nausea And Vomiting  . Penicillins Rash    Patient states rash/itch with penicillin Has patient had a PCN reaction causing immediate rash, facial/tongue/throat swelling, SOB or lightheadedness with hypotension: Yes- broke me out and i was itching Has patient had a PCN reaction causing severe rash involving mucus membranes or skin necrosis: No Has patient had a PCN reaction that required hospitalization Yes- i was already in the hospital Has patient had a PCN reaction occurring within the last 10 years: No If all of the above answers are "NO  . Tylenol [Acetaminophen] Hives    Chief Complaint  Patient presents with  . Medical Management of Chronic Issues    Pt is being seen for a 3 month routine visit.   . Other    Pt has [employer] healthcare assessment to be completed in blue chart  . MMSE    17/30 due to unable to read/write/calculate due to low education; failed clock     HPI: Patient is a 81 y.o. female seen in the office today for routine follow up.  Pt with hx of morbid obesity, CHF, HTN, COPD, OSA, DM and chronic pain. She also has lumbar spinal stenosis at multiple level, DJD, advanced OA of left shoulder joint and chronic rotator cuff tear on the right.  Following with Tivis Ringer Pharm D due to diabetes, last A1c is 6.1, under excellent control.  Some how  both sertraline and duloxetine were on pts medication, however she  reports she was not taking the sertraline but pharmacy was still sending her the medication COPD-stable,  follows with pulmonary due to COPD and OSA Chronic pain/lumbar spinal stenosis at multiple level, DJD, advanced OA-reports ongoing back pain and sciatica laying down is the only thing that helps. Takes HC/APAP and gabapentin which helps for a little bit.  Saw orthopedic (Dr Eulas Post) in the past for this but he retired. Was taking spinal injections previously but has not had any of these in years.  Reports in the past few months pain and weakness getting worse.   Reports her son died last month- got shot 3 times. Has had a hard time with this. He helped her a lot. Now she does not have someone to help her.  Had a chair that helped her but reports she had bed bugs and they had to get rid of this.  Still currently with infestation. They are working on it though.  Also having to buy a new mattress.   Has a person who comes and helps her clean up and will stay there with her while she bathes and puts her clothes on. Supposed to help her cook but she does not know how.  Review of Systems:  Review of Systems  Constitutional: Negative for chills, fever and weight loss.  Respiratory: Positive for cough and shortness of breath. Negative for sputum production.        Cough and shortness of breath stable. Uses O2 at home but can not  bring tank out because it is too heavy.  Cardiovascular: Positive for leg swelling. Negative for chest pain and palpitations.  Gastrointestinal: Negative for abdominal pain, constipation, diarrhea and heartburn.  Genitourinary: Negative for dysuria, frequency and urgency.  Musculoskeletal: Positive for back pain and joint pain. Negative for falls and myalgias.       Right ankle pain  Skin: Negative.   Neurological: Positive for tingling, sensory change and weakness. Negative for dizziness and headaches.  Psychiatric/Behavioral: Negative for depression and memory loss. The  patient does not have insomnia.     Past Medical History:  Diagnosis Date  . Acute bronchitis   . Anxiety   . Arthritis   . Chronic kidney disease, stage II (mild)   . Complications affecting other specified body systems, hypertension   . Congestive heart failure, unspecified    stress test in March at [facility]  . COPD (chronic obstructive pulmonary disease) ([place])    present for several years, diagnosed in the last few years  . Degenerative arthritis of knee, bilateral   . Diabetes mellitus   . Diaphragmatic hernia without mention of obstruction or gangrene   . Disorder of bone and cartilage, unspecified   . GERD (gastroesophageal reflux disease)   . Hiatal hernia   . Hyperlipidemia   . Hypertension   . Hypopotassemia   . Insomnia, unspecified   . Lumbago   . Obese   . Osteoarthrosis, unspecified whether generalized or localized, unspecified site   . PONV (postoperative nausea and vomiting)   . Scoliosis   . Secondary diabetes mellitus with renal manifestations, not stated as uncontrolled, or unspecified(249.40)   . Unspecified glaucoma(365.9)   . Unspecified hereditary and idiopathic peripheral neuropathy    Past Surgical History:  Procedure Laterality Date  . ABDOMINAL HYSTERECTOMY    . COLONOSCOPY  08/06/2011  . FOOT SURGERY    . KNEE ARTHROSCOPY     bilateral  . SHOULDER SURGERY    . TOTAL HIP ARTHROPLASTY     bilateral   Social History:   reports that she quit smoking about 20 years ago. Her smoking use included cigarettes. She has a 1.50 pack-year smoking history. she has never used smokeless tobacco. She reports that she does not drink alcohol or use drugs.  Family History  Problem Relation Age of Onset  . Diabetes Mother   . Heart disease Mother   . Heart disease Brother   . Diabetes Brother   . Kidney disease Father   . Scoliosis Sister   . Diabetes Brother   . Diabetes Brother   . Heart disease Brother   . Scoliosis Brother   . Stroke  Brother   . Heart attack Brother     Medications:   Medication List        Accurate as of 10/11/17 10:33 AM. Always use your most recent med list.          albuterol 108 (90 Base) MCG/ACT inhaler Commonly known as:  PROVENTIL HFA;VENTOLIN HFA Inhale 2 puffs into the lungs every 4 (four) hours as needed for wheezing or shortness of breath.   AMITIZA 24 MCG capsule Generic drug:  lubiprostone TAKE 1 CAPSULE BY MOUTH TWICE DAILY WITH MEALS   aspirin EC 81 MG tablet   budesonide 0.25 MG/2ML nebulizer solution Commonly known as:  PULMICORT Take one ampule in Neb twice daily routinely for COPD  J44.1   cilostazol 100 MG tablet Commonly known as:  PLETAL TAKE 1 TABLET BY MOUTH ONCE  DAILY   diclofenac sodium 1 % Gel Commonly known as:  VOLTAREN APPLY 4 GRAMS TOPICALLY TWICE DAILY AS NEEDED FOR PAIN   DULoxetine 30 MG capsule Commonly known as:  CYMBALTA TAKE 1 CAPSULE BY MOUTH EVERY DAY TO HELP ANXIETY AND PAINS.   EASY TOUCH INSULIN SYRINGE 31G X 5/16" 0.5 ML Misc Generic drug:  Insulin Syringe-Needle U-100 USE UP TO 2 TIMES A DAY AS DIRECTED.   esomeprazole 40 MG capsule Commonly known as:  NEXIUM TAKE 1 CAPSULE BY MOUTH ONCE DAILY   furosemide 40 MG tablet Commonly known as:  LASIX TAKE 1 TABLET BY MOUTH TWICE A DAY   gabapentin 100 MG capsule Commonly known as:  NEURONTIN TAKE 2 CAPSULES BY MOUTH THREE TIMES A DAY   guaifenesin 100 MG/5ML syrup Commonly known as:  ROBITUSSIN   hydrALAZINE 50 MG tablet Commonly known as:  APRESOLINE TAKE 1 TABLET BY MOUTH THREE TIMES A DAY   HYDROcodone-acetaminophen 10-325 MG tablet Commonly known as:  NORCO Take 1 tablet by mouth every 6 (six) hours as needed.   insulin aspart protamine- aspart (70-30) 100 UNIT/ML injection Commonly known as:  NOVOLOG MIX 70/30 Inject 40 units subcutaneously with breakfast and inject 25 units subcutaneously with dinner to control blood sugar   ipratropium-albuterol 0.5-2.5 (3)  MG/3ML Soln Commonly known as:  DUONEB Take 35mls by nebulization every 6 hours as needed for SOB/Wheezing   metoprolol succinate 25 MG 24 hr tablet Commonly known as:  TOPROL-XL TAKE 1 TABLET BY MOUTH ONCE DAILY WITH OR IMMEDIATELY FOLLOWING A MEAL FOR BLOOD PRESSURE   ondansetron 8 MG disintegrating tablet Commonly known as:  ZOFRAN ODT Take 1 tablet (8 mg total) by mouth every 8 (eight) hours as needed for nausea or vomiting.   potassium chloride SA 20 MEQ tablet Commonly known as:  K-DUR,KLOR-CON TAKE 1 TABLET BY MOUTH ONCE DAILY   simvastatin 20 MG tablet Commonly known as:  ZOCOR TAKE 1 TABLET BY MOUTH ONCE DAILY   sucralfate 1 g tablet Commonly known as:  CARAFATE TAKE 1 TABLET BY MOUTH TWICE DAILY ON EMPTY STOMACH   tamsulosin 0.4 MG Caps capsule Commonly known as:  FLOMAX Take 1 capsule (0.4 mg total) by mouth daily.   timolol 0.5 % ophthalmic solution Commonly known as:  TIMOPTIC INSTILL 1 DROP INTO EACH EYE TWICE A DAY   TRAVATAN Z 0.004 % Soln ophthalmic solution Generic drug:  Travoprost (BAK Free)   Vitamin D3 2000 units Tabs        Physical Exam:  Vitals:   10/11/17 1012  BP: 132/82  Pulse: 72  Resp: 18  Temp: 98.2 F (36.8 C)  TempSrc: Oral  SpO2: 96%  Weight: 207 lb (93.9 kg)  Height: 5\' 2"  (1.575 m)   Body mass index is 37.86 kg/m.  Physical Exam  Constitutional: She is oriented to person, place, and time. She appears well-developed and well-nourished.  HENT:  Mouth/Throat: Oropharynx is clear and moist. No oropharyngeal exudate.  Eyes: Pupils are equal, round, and reactive to light.  Neck: Neck supple. Carotid bruit is not present.  Cardiovascular: Normal rate, regular rhythm and intact distal pulses. Exam reveals no gallop and no friction rub.  Murmur (1/6 SEM) heard. Pulmonary/Chest: Effort normal. No respiratory distress. She has decreased breath sounds. She has no wheezes. She has no rales.  Diminished throughout  Abdominal:  Soft. Bowel sounds are normal. There is no hepatomegaly.  Musculoskeletal: She exhibits edema and deformity.  Both UE abduction limited to  35-40 degrees. External rotation illicits pain. Has crepitus in both her shoulder joint. Strength 3/5 in upper extremity with pain of 7-8/10. Strength 3/5 in lower extremity. Right foot internally rotates. Increase tenderness with Limited ROM. Mild swelling noted. Mild darker discoloration noted   Neurological: She is alert and oriented to person, place, and time. She displays abnormal reflex. Coordination abnormal.  Skin: Skin is warm and dry.  Psychiatric: She has a normal mood and affect. Her behavior is normal. Thought content normal.    Labs reviewed: Basic Metabolic Panel: Recent Labs    05/17/17 1448 06/21/17 1111 09/28/17 0811  NA 141 143 142  K 3.4* 3.4* 3.7  CL 102 102 104  CO2 25 31 26   GLUCOSE 117* 74 67  BUN 39* 18 17  CREATININE 2.68* 2.00* 2.09*  CALCIUM 8.9 9.2 8.9   Liver Function Tests: Recent Labs    05/06/17 0831 05/07/17 0342 06/21/17 1111 09/28/17 0811  AST 18 14* 14 16  ALT 12* 10* 8 8  ALKPHOS 93 78 90  --   BILITOT 0.6 0.6 0.3 0.2  PROT 7.0 6.5 6.8 6.8  ALBUMIN 3.4* 3.1* 3.7  --    Recent Labs    05/03/17 1204 05/06/17 0831  LIPASE 19 17   Recent Labs    05/06/17 0831  AMMONIA 29   CBC: Recent Labs    05/06/17 0831 05/07/17 0342 05/17/17 1448  WBC 7.8 8.1 7.9  NEUTROABS 6.1  --  4,345  HGB 10.0* 9.1* 10.6*  HCT 33.0* 29.7* 33.5*  MCV 82.5 82.0 79.8*  PLT 271 261 267   Lipid Panel: No results for input(s): CHOL, HDL, LDLCALC, TRIG, CHOLHDL, LDLDIRECT in the last 8760 hours. TSH: No results for input(s): TSH in the last 8760 hours. A1C: Lab Results  Component Value Date   HGBA1C 6.1 (H) 09/28/2017     Assessment/Plan 1. Spondylolisthesis of lumbar region -increased pain in lumbar spine with increase in sciatica. Pt with hx of abnormal MRI from 2013 but symptoms had been stable. Pt  with generalized weakness and bilateral knee surgery therefore neuro exam limited. Consider referral to neurosurgery, previously seeing orthopedic who has since retired.  conts to take hydrocodone/apap with gabapentin which only offers limited benefit.  - MR Lumbar Spine Wo Contrast; Future - Ambulatory referral to [place]  2. Chronic midline low back pain with sciatica, sciatica laterality unspecified See number 1  3. Chronic obstructive pulmonary disease, unspecified COPD type ([place]) conts to follow up with pulmonary. Overall COPD stable. conts on budesonide and duoneb PRN  4. Essential hypertension, benign Blood pressure stable on hydralazine, Toprol XL and lasix   5. Type 2 diabetes mellitus with diabetic nephropathy, with long-term current use of insulin (HCC) A1c at goal, cont to follow up with [facility] Pharm D.   6. Chronic diastolic congestive heart failure ([place]) Euvolemic at this time. conts on lasix with toprol XL  7. Chronic kidney disease (CKD), stage IV (severe) (H[facility]) Encourage proper hydration and to avoid NSAIDS (Aleve, Advil, Motrin, Ibuprofen), conts to follow with nephrologist routinely.   8. Anxiety and depression -despite her son passing her mood stable on cymbalta. Will cont current regimen.   9. Hyperlipidemia, unspecified hyperlipidemia type conts on zocor  - Lipid Panel; Future  Next appt: 3 months.  Michelle Buck. Harle Battiest  [facility] [phone] 8 am - 5 pm) [phone] (after hours)

## 2017-10-12 ENCOUNTER — Other Ambulatory Visit: Payer: Self-pay | Admitting: Nurse Practitioner

## 2017-10-12 ENCOUNTER — Telehealth: Payer: Self-pay

## 2017-10-12 DIAGNOSIS — I739 Peripheral vascular disease, unspecified: Secondary | ICD-10-CM

## 2017-10-12 NOTE — Telephone Encounter (Signed)
I spoke with patient's sister, who verbalized understanding. She stated that she would pass the message along.

## 2017-10-12 NOTE — Telephone Encounter (Signed)
-----   Message from [name], NP sent at 10/12/2017  9:46 AM EST ----- Please notify pt that I order an ABI to evaluate blood flow in her legs. She is currently taking cilostazol (PLETAL) 100 MG tablet for peripheral  artery disease but no diagnostic testing.   Thank you.

## 2017-10-13 DIAGNOSIS — I5032 Chronic diastolic (congestive) heart failure: Secondary | ICD-10-CM | POA: Diagnosis not present

## 2017-10-13 DIAGNOSIS — E1122 Type 2 diabetes mellitus with diabetic chronic kidney disease: Secondary | ICD-10-CM | POA: Diagnosis not present

## 2017-10-13 DIAGNOSIS — J449 Chronic obstructive pulmonary disease, unspecified: Secondary | ICD-10-CM | POA: Diagnosis not present

## 2017-10-13 DIAGNOSIS — N184 Chronic kidney disease, stage 4 (severe): Secondary | ICD-10-CM | POA: Diagnosis not present

## 2017-10-13 DIAGNOSIS — M47816 Spondylosis without myelopathy or radiculopathy, lumbar region: Secondary | ICD-10-CM | POA: Diagnosis not present

## 2017-10-13 DIAGNOSIS — I130 Hypertensive heart and chronic kidney disease with heart failure and stage 1 through stage 4 chronic kidney disease, or unspecified chronic kidney disease: Secondary | ICD-10-CM | POA: Diagnosis not present

## 2017-10-13 DIAGNOSIS — M4316 Spondylolisthesis, lumbar region: Secondary | ICD-10-CM | POA: Diagnosis not present

## 2017-10-13 DIAGNOSIS — G4733 Obstructive sleep apnea (adult) (pediatric): Secondary | ICD-10-CM | POA: Diagnosis not present

## 2017-10-15 DIAGNOSIS — J449 Chronic obstructive pulmonary disease, unspecified: Secondary | ICD-10-CM | POA: Diagnosis not present

## 2017-10-16 IMAGING — CT CT HEAD W/O CM
4 series · 16 of 47 positions shown, 18 images · non-contrast
Comparison: None available

CLINICAL DATA: Fall getting out of bed this morning. Weakness for 3
days.

EXAM:
CT HEAD WITHOUT CONTRAST
TECHNIQUE: Contiguous axial images were obtained from the base of the skull
through the vertex without intravenous contrast.

[Series 3: head without · axial · non-contrast · 0.43mm/px · z∈[-134,-14]mm · 7 of 33 slices shown, 9 images]
[im 5/33  brain]
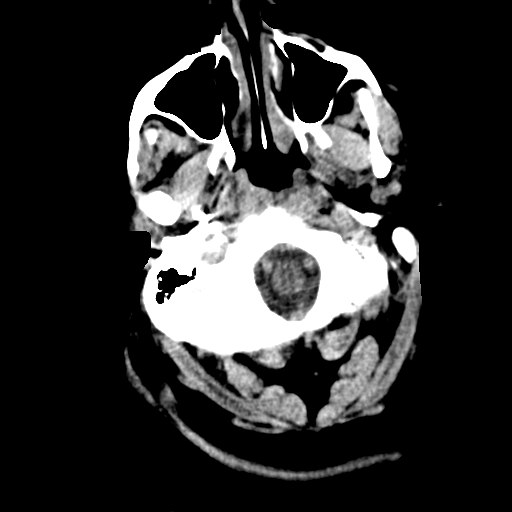
[im 5/33  bone]
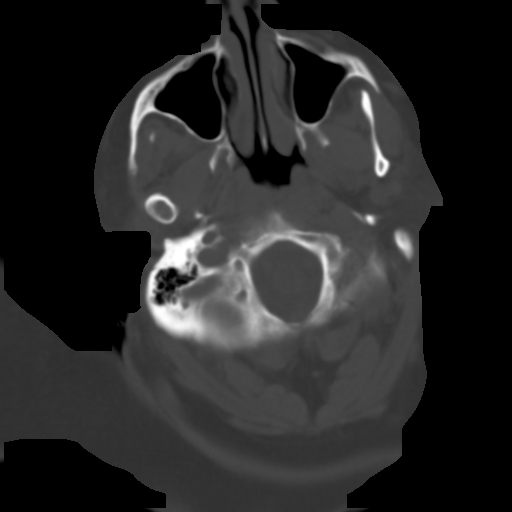
[im 9/33  brain]
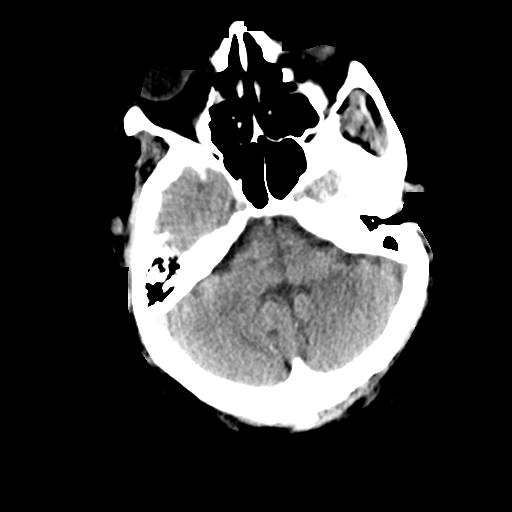
[im 13/33  brain]
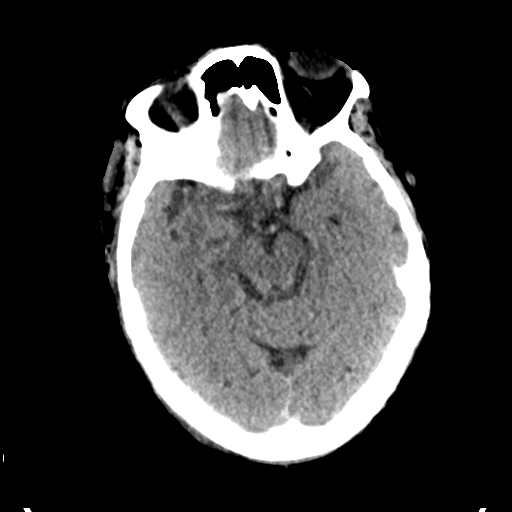
[im 17/33  brain]
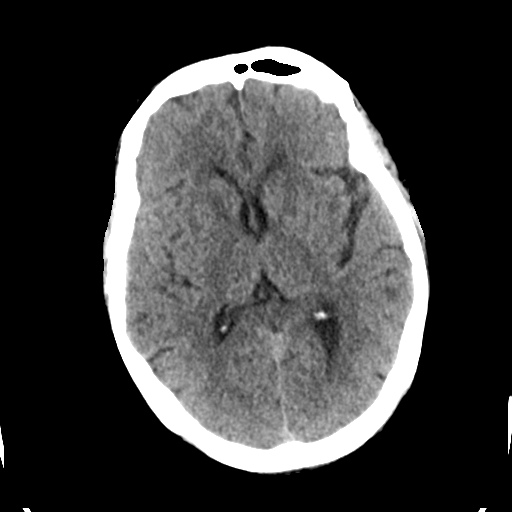
[im 21/33  brain]
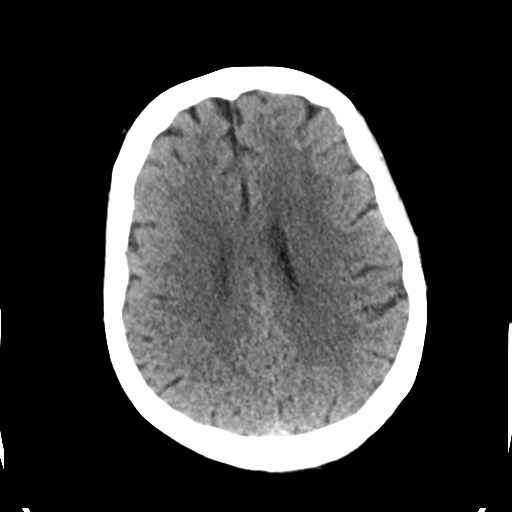
[im 21/33  bone]
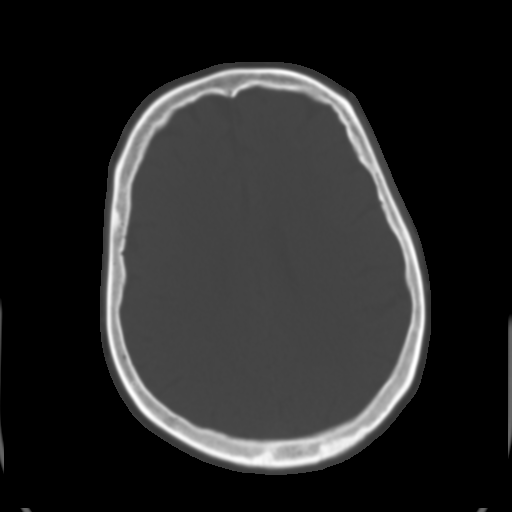
[im 25/33  brain]
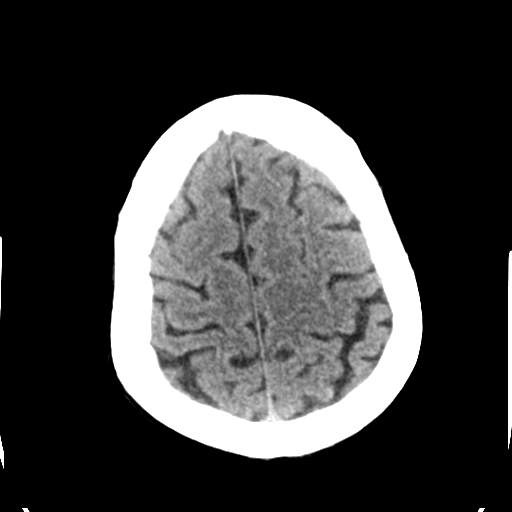
[im 29/33  brain]
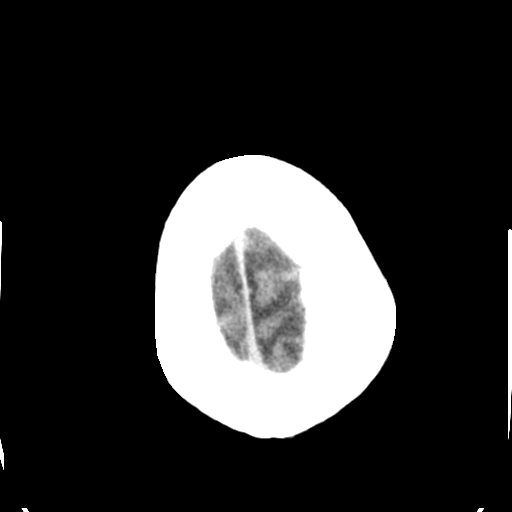

[Series 4: head bone · axial · 0.43mm/px · z∈[-138,-106]mm · 3 of 83 slices shown]
[im 9/83  bone]
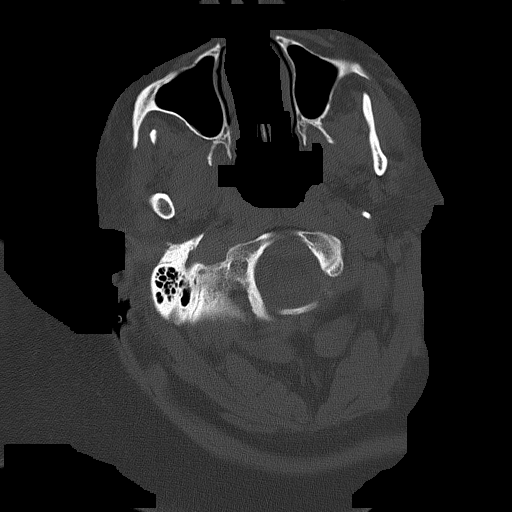
[im 17/83  bone]
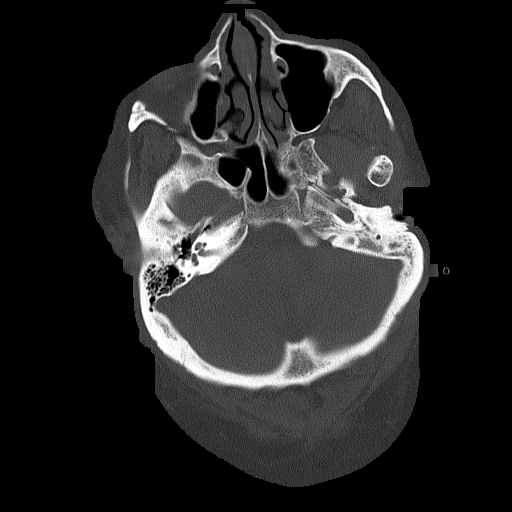
[im 25/83  bone]
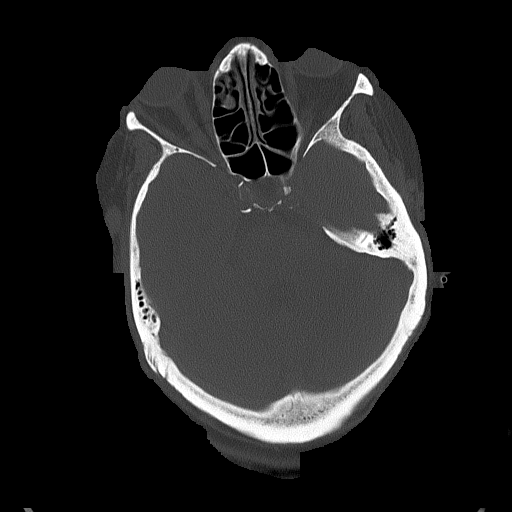

[Series 5: head without cor · coronal · non-contrast · 0.33mm/px · 3 of 67 slices shown]
[im 23/67  brain]
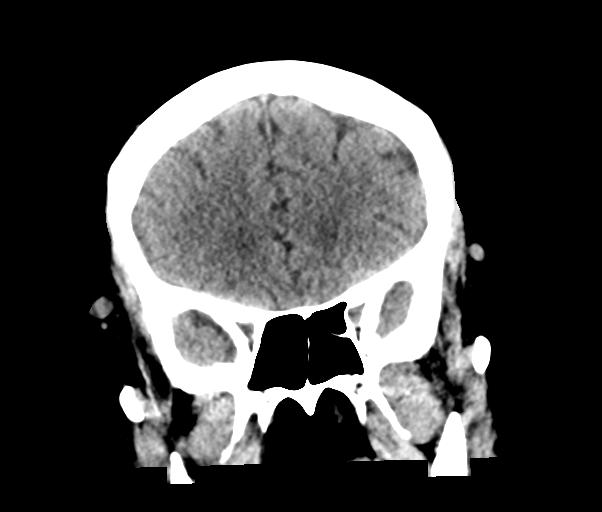
[im 30/67  brain]
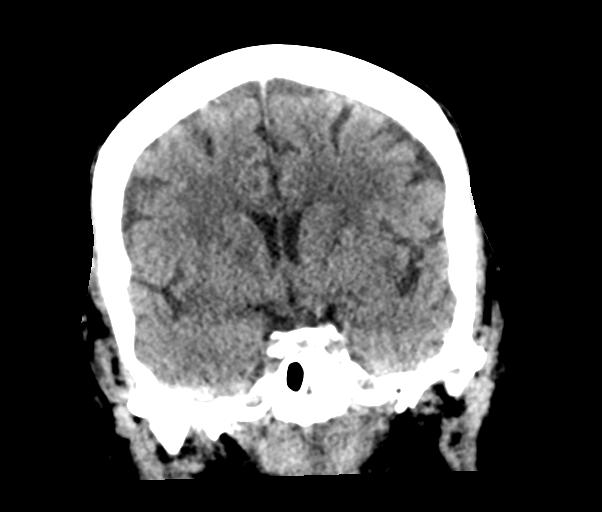
[im 37/67  brain]
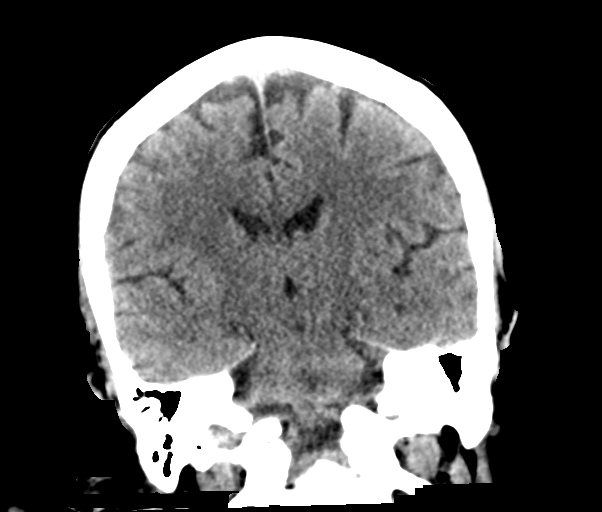

[Series 6: head without sag · sagittal · non-contrast · 0.33mm/px · 3 of 50 slices shown]
[im 17/50  brain]
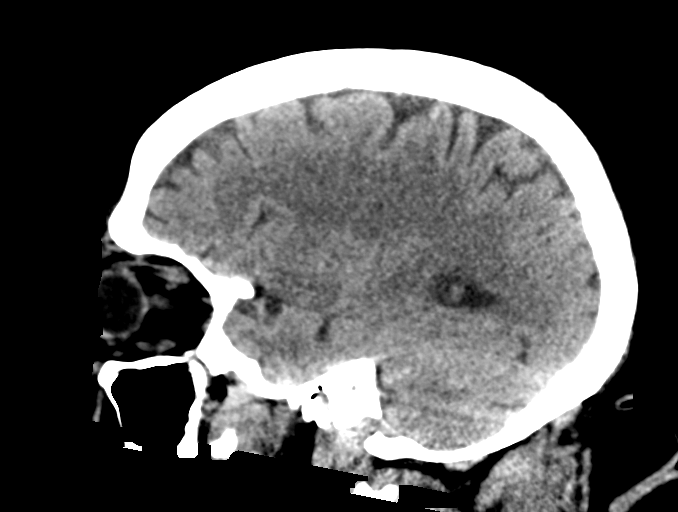
[im 25/50  brain]
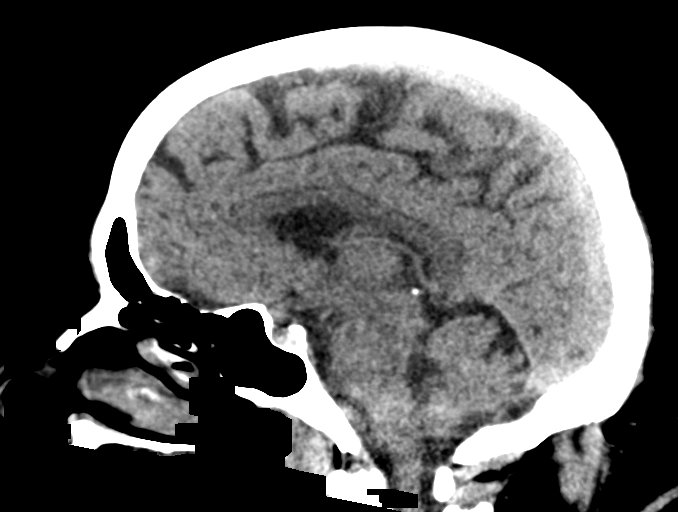
[im 33/50  brain]
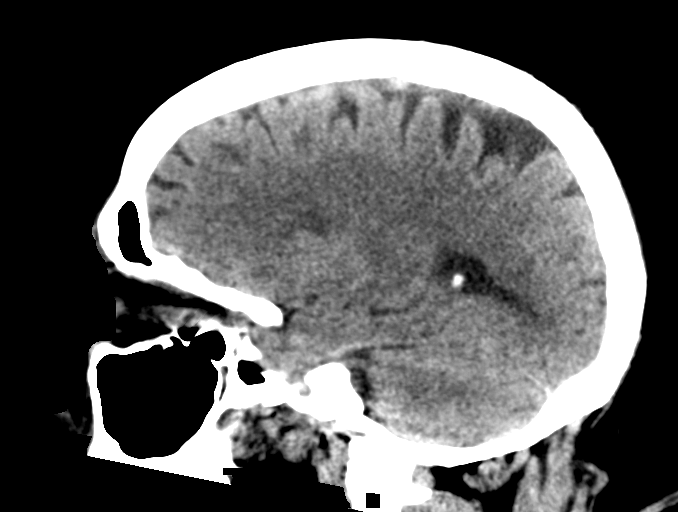

[16 of 47 positions shown; findings below may reference images not displayed]

FINDINGS: Brain: No evidence of acute cortical infarction, hemorrhage,
hydrocephalus, extra-axial collection or mass lesion/mass effect.
Bilateral patchy low-density in the deep cerebral white matter
tracts with indistinct deep gray nuclei, likely primarily chronic
but superimposed infarct could be obscured.

Vascular: No hyperdense vessel.  No unexpected calcification.

Skull: No acute finding.

Sinuses/Orbits: Bilateral cataract resection. Sequela of resolved
left mastoiditis.
IMPRESSION: 1. No definite acute finding.
2. Chronic small vessel ischemic injury in the deep white matter and
basal ganglia. If there is a focal deficit, note that these changes
could obscure acute ischemia.

## 2017-10-18 DIAGNOSIS — M47816 Spondylosis without myelopathy or radiculopathy, lumbar region: Secondary | ICD-10-CM | POA: Diagnosis not present

## 2017-10-18 DIAGNOSIS — I130 Hypertensive heart and chronic kidney disease with heart failure and stage 1 through stage 4 chronic kidney disease, or unspecified chronic kidney disease: Secondary | ICD-10-CM | POA: Diagnosis not present

## 2017-10-18 DIAGNOSIS — M4316 Spondylolisthesis, lumbar region: Secondary | ICD-10-CM | POA: Diagnosis not present

## 2017-10-18 DIAGNOSIS — I5032 Chronic diastolic (congestive) heart failure: Secondary | ICD-10-CM | POA: Diagnosis not present

## 2017-10-18 DIAGNOSIS — J449 Chronic obstructive pulmonary disease, unspecified: Secondary | ICD-10-CM | POA: Diagnosis not present

## 2017-10-22 DIAGNOSIS — I130 Hypertensive heart and chronic kidney disease with heart failure and stage 1 through stage 4 chronic kidney disease, or unspecified chronic kidney disease: Secondary | ICD-10-CM | POA: Diagnosis not present

## 2017-10-22 DIAGNOSIS — I5032 Chronic diastolic (congestive) heart failure: Secondary | ICD-10-CM | POA: Diagnosis not present

## 2017-10-22 DIAGNOSIS — M4316 Spondylolisthesis, lumbar region: Secondary | ICD-10-CM | POA: Diagnosis not present

## 2017-10-22 DIAGNOSIS — J449 Chronic obstructive pulmonary disease, unspecified: Secondary | ICD-10-CM | POA: Diagnosis not present

## 2017-10-22 DIAGNOSIS — M47816 Spondylosis without myelopathy or radiculopathy, lumbar region: Secondary | ICD-10-CM | POA: Diagnosis not present

## 2017-10-25 DIAGNOSIS — M47816 Spondylosis without myelopathy or radiculopathy, lumbar region: Secondary | ICD-10-CM | POA: Diagnosis not present

## 2017-10-25 DIAGNOSIS — I5032 Chronic diastolic (congestive) heart failure: Secondary | ICD-10-CM | POA: Diagnosis not present

## 2017-10-25 DIAGNOSIS — I130 Hypertensive heart and chronic kidney disease with heart failure and stage 1 through stage 4 chronic kidney disease, or unspecified chronic kidney disease: Secondary | ICD-10-CM | POA: Diagnosis not present

## 2017-10-25 DIAGNOSIS — J449 Chronic obstructive pulmonary disease, unspecified: Secondary | ICD-10-CM | POA: Diagnosis not present

## 2017-10-25 DIAGNOSIS — M4316 Spondylolisthesis, lumbar region: Secondary | ICD-10-CM | POA: Diagnosis not present

## 2017-10-27 ENCOUNTER — Other Ambulatory Visit: Payer: Self-pay | Admitting: *Deleted

## 2017-10-27 DIAGNOSIS — M25571 Pain in right ankle and joints of right foot: Secondary | ICD-10-CM

## 2017-10-27 DIAGNOSIS — G8929 Other chronic pain: Secondary | ICD-10-CM

## 2017-10-27 DIAGNOSIS — M545 Low back pain, unspecified: Secondary | ICD-10-CM

## 2017-10-27 MED ORDER — HYDROCODONE-ACETAMINOPHEN 10-325 MG PO TABS: 1.0000 | ORAL_TABLET | Freq: Four times a day (QID) | ORAL | 0 refills | Status: DC | PRN

## 2017-10-29 ENCOUNTER — Other Ambulatory Visit: Payer: Self-pay | Admitting: Nurse Practitioner

## 2017-10-29 DIAGNOSIS — J449 Chronic obstructive pulmonary disease, unspecified: Secondary | ICD-10-CM | POA: Diagnosis not present

## 2017-10-29 DIAGNOSIS — M47816 Spondylosis without myelopathy or radiculopathy, lumbar region: Secondary | ICD-10-CM | POA: Diagnosis not present

## 2017-10-29 DIAGNOSIS — M1990 Unspecified osteoarthritis, unspecified site: Secondary | ICD-10-CM

## 2017-10-29 DIAGNOSIS — I5032 Chronic diastolic (congestive) heart failure: Secondary | ICD-10-CM | POA: Diagnosis not present

## 2017-10-29 DIAGNOSIS — M4316 Spondylolisthesis, lumbar region: Secondary | ICD-10-CM | POA: Diagnosis not present

## 2017-10-29 DIAGNOSIS — I130 Hypertensive heart and chronic kidney disease with heart failure and stage 1 through stage 4 chronic kidney disease, or unspecified chronic kidney disease: Secondary | ICD-10-CM | POA: Diagnosis not present

## 2017-11-03 DIAGNOSIS — I130 Hypertensive heart and chronic kidney disease with heart failure and stage 1 through stage 4 chronic kidney disease, or unspecified chronic kidney disease: Secondary | ICD-10-CM | POA: Diagnosis not present

## 2017-11-03 DIAGNOSIS — M47816 Spondylosis without myelopathy or radiculopathy, lumbar region: Secondary | ICD-10-CM | POA: Diagnosis not present

## 2017-11-03 DIAGNOSIS — M4316 Spondylolisthesis, lumbar region: Secondary | ICD-10-CM | POA: Diagnosis not present

## 2017-11-03 DIAGNOSIS — J449 Chronic obstructive pulmonary disease, unspecified: Secondary | ICD-10-CM | POA: Diagnosis not present

## 2017-11-03 DIAGNOSIS — I5032 Chronic diastolic (congestive) heart failure: Secondary | ICD-10-CM | POA: Diagnosis not present

## 2017-11-04 ENCOUNTER — Telehealth: Payer: Self-pay

## 2017-11-04 NOTE — Telephone Encounter (Signed)
I received a call requesting Michelle Buck's NPI number to input on a form for [employer]. I informed caller that I needed to confirm that [facility] information.  I called patient's sister and she was unaware if had requested supplies from this company  I called patient and patient states she does not know who this company is and if she needs Michelle Buck to fill something out she usually brings it to her appointment. Patient did not wish to get supplies from this company  I called [facility] back and informed them to remove patient from soliciting list, I was informed that they spoke with patient today and she gave them our number. Receptionist stated she would follow-up with patient

## 2017-11-05 DIAGNOSIS — J449 Chronic obstructive pulmonary disease, unspecified: Secondary | ICD-10-CM | POA: Diagnosis not present

## 2017-11-05 DIAGNOSIS — I130 Hypertensive heart and chronic kidney disease with heart failure and stage 1 through stage 4 chronic kidney disease, or unspecified chronic kidney disease: Secondary | ICD-10-CM | POA: Diagnosis not present

## 2017-11-05 DIAGNOSIS — M47816 Spondylosis without myelopathy or radiculopathy, lumbar region: Secondary | ICD-10-CM | POA: Diagnosis not present

## 2017-11-05 DIAGNOSIS — I5032 Chronic diastolic (congestive) heart failure: Secondary | ICD-10-CM | POA: Diagnosis not present

## 2017-11-05 DIAGNOSIS — M4316 Spondylolisthesis, lumbar region: Secondary | ICD-10-CM | POA: Diagnosis not present

## 2017-11-11 DIAGNOSIS — M4316 Spondylolisthesis, lumbar region: Secondary | ICD-10-CM | POA: Diagnosis not present

## 2017-11-11 DIAGNOSIS — I130 Hypertensive heart and chronic kidney disease with heart failure and stage 1 through stage 4 chronic kidney disease, or unspecified chronic kidney disease: Secondary | ICD-10-CM | POA: Diagnosis not present

## 2017-11-11 DIAGNOSIS — M47816 Spondylosis without myelopathy or radiculopathy, lumbar region: Secondary | ICD-10-CM | POA: Diagnosis not present

## 2017-11-11 DIAGNOSIS — I5032 Chronic diastolic (congestive) heart failure: Secondary | ICD-10-CM | POA: Diagnosis not present

## 2017-11-11 DIAGNOSIS — H401131 Primary open-angle glaucoma, bilateral, mild stage: Secondary | ICD-10-CM | POA: Diagnosis not present

## 2017-11-11 DIAGNOSIS — J449 Chronic obstructive pulmonary disease, unspecified: Secondary | ICD-10-CM | POA: Diagnosis not present

## 2017-11-14 DIAGNOSIS — J449 Chronic obstructive pulmonary disease, unspecified: Secondary | ICD-10-CM | POA: Diagnosis not present

## 2017-11-17 ENCOUNTER — Ambulatory Visit (HOSPITAL_COMMUNITY)
Admission: RE | Admit: 2017-11-17 | Discharge: 2017-11-17 | Disposition: A | Discharge status: Home / Self Care | Payer: Medicare HMO | Source: Ambulatory Visit | Attending: Cardiovascular Disease | Admitting: Cardiovascular Disease

## 2017-11-17 DIAGNOSIS — I739 Peripheral vascular disease, unspecified: Secondary | ICD-10-CM | POA: Diagnosis not present

## 2017-11-19 ENCOUNTER — Other Ambulatory Visit: Payer: Self-pay | Admitting: *Deleted

## 2017-11-19 DIAGNOSIS — J441 Chronic obstructive pulmonary disease with (acute) exacerbation: Secondary | ICD-10-CM

## 2017-11-19 MED ORDER — FUROSEMIDE 40 MG PO TABS: 40.0000 mg | ORAL_TABLET | Freq: Two times a day (BID) | ORAL | 1 refills | Status: DC

## 2017-11-19 NOTE — Telephone Encounter (Signed)
[facility] [place] [facility]

## 2017-11-21 DIAGNOSIS — J449 Chronic obstructive pulmonary disease, unspecified: Secondary | ICD-10-CM | POA: Diagnosis not present

## 2017-11-24 ENCOUNTER — Other Ambulatory Visit: Payer: Medicare HMO

## 2017-11-24 ENCOUNTER — Other Ambulatory Visit: Payer: Self-pay

## 2017-11-24 DIAGNOSIS — E785 Hyperlipidemia, unspecified: Secondary | ICD-10-CM

## 2017-11-24 DIAGNOSIS — J449 Chronic obstructive pulmonary disease, unspecified: Secondary | ICD-10-CM

## 2017-11-24 LAB — LIPID PANEL
CHOL/HDL RATIO: 2.6 (calc) (ref <5.0)
CHOLESTEROL: 164 mg/dL (ref <200)
HDL: 64 mg/dL (ref >50)
LDL CHOLESTEROL (CALC): 75 mg/dL
Non-HDL Cholesterol (Calc): 100 mg/dL (calc) (ref <130)
Triglycerides: 146 mg/dL (ref <150)

## 2017-11-24 MED ORDER — ALBUTEROL SULFATE HFA 108 (90 BASE) MCG/ACT IN AERS: 2.0000 | INHALATION_SPRAY | RESPIRATORY_TRACT | 3 refills | Status: DC | PRN

## 2017-11-24 NOTE — Telephone Encounter (Signed)
Patient asked for a refill on her albuterol inhaler. [facility].

## 2017-11-26 ENCOUNTER — Other Ambulatory Visit: Payer: Self-pay | Admitting: *Deleted

## 2017-11-26 DIAGNOSIS — M545 Low back pain: Secondary | ICD-10-CM

## 2017-11-26 DIAGNOSIS — M25571 Pain in right ankle and joints of right foot: Secondary | ICD-10-CM

## 2017-11-26 DIAGNOSIS — R69 Illness, unspecified: Secondary | ICD-10-CM | POA: Diagnosis not present

## 2017-11-26 DIAGNOSIS — G8929 Other chronic pain: Secondary | ICD-10-CM

## 2017-11-26 MED ORDER — HYDROCODONE-ACETAMINOPHEN 10-325 MG PO TABS: 1.0000 | ORAL_TABLET | Freq: Four times a day (QID) | ORAL | 0 refills | Status: DC | PRN

## 2017-11-26 NOTE — Telephone Encounter (Signed)
Patient Requested  [facility] Confirmed Pended Rx and sent to [facility] for approval.

## 2017-11-29 ENCOUNTER — Telehealth: Payer: Self-pay

## 2017-11-29 NOTE — Telephone Encounter (Signed)
Michelle Buck with [facility] called to request verbal order to continue PT (physical therapy), order given per [facility] standing protocol.   Michelle Buck verbally informed

## 2017-12-02 ENCOUNTER — Other Ambulatory Visit: Payer: Self-pay | Admitting: Nurse Practitioner

## 2017-12-02 DIAGNOSIS — I5032 Chronic diastolic (congestive) heart failure: Secondary | ICD-10-CM | POA: Diagnosis not present

## 2017-12-02 DIAGNOSIS — M47816 Spondylosis without myelopathy or radiculopathy, lumbar region: Secondary | ICD-10-CM | POA: Diagnosis not present

## 2017-12-02 DIAGNOSIS — J449 Chronic obstructive pulmonary disease, unspecified: Secondary | ICD-10-CM | POA: Diagnosis not present

## 2017-12-02 DIAGNOSIS — M4316 Spondylolisthesis, lumbar region: Secondary | ICD-10-CM | POA: Diagnosis not present

## 2017-12-02 DIAGNOSIS — I130 Hypertensive heart and chronic kidney disease with heart failure and stage 1 through stage 4 chronic kidney disease, or unspecified chronic kidney disease: Secondary | ICD-10-CM | POA: Diagnosis not present

## 2017-12-07 ENCOUNTER — Ambulatory Visit
Admission: RE | Admit: 2017-12-07 | Discharge: 2017-12-07 | Disposition: A | Discharge status: Home / Self Care | Payer: Medicare HMO | Source: Ambulatory Visit | Attending: Nurse Practitioner | Admitting: Nurse Practitioner

## 2017-12-07 DIAGNOSIS — M47816 Spondylosis without myelopathy or radiculopathy, lumbar region: Secondary | ICD-10-CM | POA: Diagnosis not present

## 2017-12-07 DIAGNOSIS — M4316 Spondylolisthesis, lumbar region: Secondary | ICD-10-CM | POA: Diagnosis not present

## 2017-12-07 DIAGNOSIS — I5032 Chronic diastolic (congestive) heart failure: Secondary | ICD-10-CM | POA: Diagnosis not present

## 2017-12-07 DIAGNOSIS — M48061 Spinal stenosis, lumbar region without neurogenic claudication: Secondary | ICD-10-CM | POA: Diagnosis not present

## 2017-12-07 DIAGNOSIS — J449 Chronic obstructive pulmonary disease, unspecified: Secondary | ICD-10-CM | POA: Diagnosis not present

## 2017-12-07 DIAGNOSIS — I130 Hypertensive heart and chronic kidney disease with heart failure and stage 1 through stage 4 chronic kidney disease, or unspecified chronic kidney disease: Secondary | ICD-10-CM | POA: Diagnosis not present

## 2017-12-08 ENCOUNTER — Other Ambulatory Visit: Payer: Self-pay | Admitting: Nurse Practitioner

## 2017-12-08 DIAGNOSIS — M48061 Spinal stenosis, lumbar region without neurogenic claudication: Secondary | ICD-10-CM

## 2017-12-09 DIAGNOSIS — M47816 Spondylosis without myelopathy or radiculopathy, lumbar region: Secondary | ICD-10-CM | POA: Diagnosis not present

## 2017-12-09 DIAGNOSIS — M4316 Spondylolisthesis, lumbar region: Secondary | ICD-10-CM | POA: Diagnosis not present

## 2017-12-09 DIAGNOSIS — I130 Hypertensive heart and chronic kidney disease with heart failure and stage 1 through stage 4 chronic kidney disease, or unspecified chronic kidney disease: Secondary | ICD-10-CM | POA: Diagnosis not present

## 2017-12-09 DIAGNOSIS — I5032 Chronic diastolic (congestive) heart failure: Secondary | ICD-10-CM | POA: Diagnosis not present

## 2017-12-09 DIAGNOSIS — J449 Chronic obstructive pulmonary disease, unspecified: Secondary | ICD-10-CM | POA: Diagnosis not present

## 2017-12-15 DIAGNOSIS — J449 Chronic obstructive pulmonary disease, unspecified: Secondary | ICD-10-CM | POA: Diagnosis not present

## 2017-12-22 DIAGNOSIS — J449 Chronic obstructive pulmonary disease, unspecified: Secondary | ICD-10-CM | POA: Diagnosis not present

## 2017-12-27 ENCOUNTER — Encounter: Payer: Self-pay | Admitting: Pharmacotherapy

## 2017-12-27 ENCOUNTER — Ambulatory Visit (INDEPENDENT_AMBULATORY_CARE_PROVIDER_SITE_OTHER): Payer: Medicare HMO | Admitting: Pharmacotherapy

## 2017-12-27 ENCOUNTER — Telehealth: Payer: Self-pay

## 2017-12-27 VITALS — BP 142/84 | HR 66 | Resp 10 | Ht 62.0 in | Wt 204.0 lb

## 2017-12-27 DIAGNOSIS — I10 Essential (primary) hypertension: Secondary | ICD-10-CM

## 2017-12-27 DIAGNOSIS — E1121 Type 2 diabetes mellitus with diabetic nephropathy: Secondary | ICD-10-CM | POA: Diagnosis not present

## 2017-12-27 DIAGNOSIS — M545 Low back pain: Secondary | ICD-10-CM

## 2017-12-27 DIAGNOSIS — N184 Chronic kidney disease, stage 4 (severe): Secondary | ICD-10-CM

## 2017-12-27 DIAGNOSIS — Z794 Long term (current) use of insulin: Secondary | ICD-10-CM | POA: Diagnosis not present

## 2017-12-27 DIAGNOSIS — M25571 Pain in right ankle and joints of right foot: Secondary | ICD-10-CM

## 2017-12-27 DIAGNOSIS — G8929 Other chronic pain: Secondary | ICD-10-CM

## 2017-12-27 NOTE — Telephone Encounter (Signed)
LMOM to Return call.  

## 2017-12-27 NOTE — Patient Instructions (Signed)
Keep up good work!  

## 2017-12-27 NOTE — Telephone Encounter (Signed)
Patient was in office today to see Michelle Buck and c/o pain medication not helping (Hydrocodone 10-325, 1 by mouth every 6 hours as needed) .  Patient states she has mentioned this to [name], NP before. Patient states pain is from waist down. Please advise    Patient is due for refill today, [place] Database verified and compliance confirmed

## 2017-12-27 NOTE — Telephone Encounter (Signed)
Pt with recent imagining -- Multiple sites of narrowing in the spinal canal, will send to orthopedics for further evaluation and treatment. (She was previously seeing ortho in the past but he retired) Pt was previously placed on increase dose of pain medication which caused increase falls, confusion and decrease LOC therefore this was removed. Requesting follow up with orthopedic at this time due to increase in pain

## 2017-12-27 NOTE — Progress Notes (Signed)
  Subjective:    Michelle Buck is a 82 y.o.African American female who presents for follow-up of Type 2 diabetes mellitus.  A1C: 6.1% (09/29/17) LDL: 75 eGFR: 46m/min SCr: 2.09  Humulin 70/30 insulin: 40 units AM, 25 units PM Average BG:  107-'147mg'$ /dl No hypoglycemia Home average BP: 120-125/60-80 Now wearing hearing aid.  Trying to make healthy food choices. Exercise potential is limited.  Did have home PT show her some exercise. Some peripheral edema Has peripheral neuropathy in feet - pain and burning. Denies problems with vision Nocturia several times per night. Polyuria during the day. Has polydipsia.  Drinking a lot of water.   Review of Systems A comprehensive review of systems was negative except for: Respiratory: positive for cough Cardiovascular: positive for lower extremity edema Genitourinary: positive for nocturia    Objective:    BP (!) 142/84   Pulse 66   Resp 10   Ht '5\' 2"'$  (1.575 m)   Wt 204 lb (92.5 kg)   SpO2 94%   BMI 37.31 kg/m   General:  alert, cooperative and no distress  Oropharynx: normal findings: lips normal without lesions and gums healthy   Eyes:  negative findings: lids and lashes normal and conjunctivae and sclerae normal   Ears:  external ears normal        Lung: clear to auscultation bilaterally  Heart:  regular rate and rhythm     Extremities: edema bilateral lower extremities  Skin: dry     Neuro: mental status, speech normal, alert and oriented x3 and reflexes normal and symmetric   Lab Review Glucose, Bld (mg/dL)  Date Value  09/28/2017 67  06/21/2017 74  05/17/2017 117 (H)   CO2 (mmol/L)  Date Value  09/28/2017 26  06/21/2017 31  05/17/2017 25   BUN (mg/dL)  Date Value  09/28/2017 17  06/21/2017 18  05/17/2017 39 (H)  02/26/2016 27  01/07/2016 21  10/15/2015 27   Creat (mg/dL)  Date Value  09/28/2017 2.09 (H)  06/21/2017 2.00 (H)  05/17/2017 2.68 (H)   A1C, CMP today    Assessment:    Diabetes  Mellitus type II, under excellent control. Will recheck labs BP above goal <130/80 today, but home BP are good.   Plan:    1.  Rx changes: none  2.  Continue Humulin 70/30 - 40 units with breakfast, 22 units with supper. 3.  Counseled on nutrition goals. 4.  Counseled on benefits of the exercise the PT taught her. 5.  Counseled on hypoglycemia 6.  BP is stable 7.  Check labs today.

## 2017-12-28 LAB — COMPLETE METABOLIC PANEL WITH GFR
AG Ratio: 1.2 (calc) (ref 1.0–2.5)
ALT: 8 U/L (ref 6–29)
AST: 15 U/L (ref 10–35)
Albumin: 3.7 g/dL (ref 3.6–5.1)
Alkaline phosphatase (APISO): 92 U/L (ref 33–130)
BUN/Creatinine Ratio: 11 (calc) (ref 6–22)
BUN: 27 mg/dL — ABNORMAL HIGH (ref 7–25)
CO2: 28 mmol/L (ref 20–32)
Calcium: 9 mg/dL (ref 8.6–10.4)
Chloride: 106 mmol/L (ref 98–110)
Creat: 2.37 mg/dL — ABNORMAL HIGH (ref 0.60–0.93)
GFR, Est African American: 22 mL/min/{1.73_m2} — ABNORMAL LOW (ref >=60)
GFR, Est Non African American: 19 mL/min/{1.73_m2} — ABNORMAL LOW (ref >=60)
Globulin: 3 g/dL (calc) (ref 1.9–3.7)
Glucose, Bld: 100 mg/dL (ref 65–139)
Potassium: 3.4 mmol/L — ABNORMAL LOW (ref 3.5–5.3)
Sodium: 145 mmol/L (ref 135–146)
Total Bilirubin: 0.2 mg/dL (ref 0.2–1.2)
Total Protein: 6.7 g/dL (ref 6.1–8.1)

## 2017-12-28 LAB — HEMOGLOBIN A1C
Hgb A1c MFr Bld: 6.5 % of total Hgb — ABNORMAL HIGH (ref <5.7)
Mean Plasma Glucose: 140 (calc)
eAG (mmol/L): 7.7 (calc)

## 2017-12-28 NOTE — Telephone Encounter (Signed)
Patient notified and agreed. Wants pain medication refill.  Pended and sent to [facility] for approval. [place] Database Verified.  Pharmacy Confirmed.   Orthopaedic referral Order placed.

## 2017-12-29 DIAGNOSIS — R69 Illness, unspecified: Secondary | ICD-10-CM | POA: Diagnosis not present

## 2017-12-29 MED ORDER — HYDROCODONE-ACETAMINOPHEN 10-325 MG PO TABS
1.0000 | ORAL_TABLET | Freq: Four times a day (QID) | ORAL | 0 refills | Status: DC | PRN
Start: 2017-12-29 — End: 2018-01-27

## 2017-12-30 ENCOUNTER — Other Ambulatory Visit: Payer: Self-pay | Admitting: Nurse Practitioner

## 2018-01-03 ENCOUNTER — Encounter: Payer: Self-pay | Admitting: *Deleted

## 2018-01-12 ENCOUNTER — Ambulatory Visit (INDEPENDENT_AMBULATORY_CARE_PROVIDER_SITE_OTHER): Payer: Medicare HMO | Admitting: Internal Medicine

## 2018-01-12 ENCOUNTER — Encounter: Payer: Self-pay | Admitting: Internal Medicine

## 2018-01-12 ENCOUNTER — Telehealth: Payer: Self-pay

## 2018-01-12 VITALS — BP 144/92 | HR 55 | Temp 97.8°F | Resp 12

## 2018-01-12 DIAGNOSIS — Z794 Long term (current) use of insulin: Secondary | ICD-10-CM

## 2018-01-12 DIAGNOSIS — M4316 Spondylolisthesis, lumbar region: Secondary | ICD-10-CM

## 2018-01-12 DIAGNOSIS — R69 Illness, unspecified: Secondary | ICD-10-CM | POA: Diagnosis not present

## 2018-01-12 DIAGNOSIS — J449 Chronic obstructive pulmonary disease, unspecified: Secondary | ICD-10-CM

## 2018-01-12 DIAGNOSIS — F4320 Adjustment disorder, unspecified: Secondary | ICD-10-CM

## 2018-01-12 DIAGNOSIS — I10 Essential (primary) hypertension: Secondary | ICD-10-CM

## 2018-01-12 DIAGNOSIS — E1121 Type 2 diabetes mellitus with diabetic nephropathy: Secondary | ICD-10-CM

## 2018-01-12 DIAGNOSIS — F4321 Adjustment disorder with depressed mood: Secondary | ICD-10-CM | POA: Diagnosis not present

## 2018-01-12 DIAGNOSIS — Z23 Encounter for immunization: Secondary | ICD-10-CM

## 2018-01-12 DIAGNOSIS — N184 Chronic kidney disease, stage 4 (severe): Secondary | ICD-10-CM | POA: Diagnosis not present

## 2018-01-12 MED ORDER — GABAPENTIN 300 MG PO CAPS: 300.0000 mg | ORAL_CAPSULE | Freq: Two times a day (BID) | ORAL | 6 refills | Status: DC
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
## 2018-01-12 NOTE — Patient Instructions (Addendum)
CHANGE GABAPENTIN TO 300MG  CAPS 2 TIMES DAILY  Continue other medications as ordered  Follow up with specialists as scheduled  Influenza vaccine given today  Follow up with Michelle Buck FASTING in 3 mos for DM, HTN, CKD, edema.

## 2018-01-12 NOTE — Telephone Encounter (Signed)
Incoming fax received from [facility] requesting orders for Knee and back brace.   Patient was in office today for an appointment and stated she did not requests these items   Fax sent back to [facility] with the indication to please remove patient from soliciting list

## 2018-01-13 ENCOUNTER — Telehealth: Payer: Self-pay

## 2018-01-13 NOTE — Telephone Encounter (Signed)
I called patient to inquire about 3 incoming faxes from [place] that states: Your Patient Michelle Buck has expressed an interest in receiving products via our pharmacy and has authorized [place] to contact you to request this order on their behalf.   1.) Hydrocortisone Valerate Cream 2.) Omega-3-Acid-Ethyl Esters 3.) Lidocaine 5 % Ointment  Per patient she is not interested in receiving any of the requested items and does not know how these soliciting companies keep getting her number. Patient states they ask her questions, the name of doctor, and then forwards these request.   Patient aware I will send a return fax informing [place] that she is not interested in request and ask that she be removed from soliciting list.   Fax sent back

## 2018-01-15 DIAGNOSIS — J449 Chronic obstructive pulmonary disease, unspecified: Secondary | ICD-10-CM | POA: Diagnosis not present

## 2018-01-19 ENCOUNTER — Telehealth: Payer: Self-pay

## 2018-01-19 NOTE — Telephone Encounter (Signed)
A call was received from [facility] requesting information on patient for a knee and back brace. Patient has verbally told the MA's at this office that she is not interested in getting these braces.   I informed rep from company that patient must call the office to verbally verify that she is interested in getting these items before any patient information will be shared.

## 2018-01-21 DIAGNOSIS — R69 Illness, unspecified: Secondary | ICD-10-CM | POA: Diagnosis not present

## 2018-01-22 DIAGNOSIS — J449 Chronic obstructive pulmonary disease, unspecified: Secondary | ICD-10-CM | POA: Diagnosis not present

## 2018-01-23 ENCOUNTER — Other Ambulatory Visit: Payer: Self-pay | Admitting: Nurse Practitioner

## 2018-01-23 DIAGNOSIS — J441 Chronic obstructive pulmonary disease with (acute) exacerbation: Secondary | ICD-10-CM

## 2018-01-27 ENCOUNTER — Other Ambulatory Visit: Payer: Self-pay | Admitting: *Deleted

## 2018-01-27 ENCOUNTER — Other Ambulatory Visit: Payer: Self-pay | Admitting: Nurse Practitioner

## 2018-01-27 DIAGNOSIS — M545 Low back pain: Principal | ICD-10-CM

## 2018-01-27 DIAGNOSIS — G8929 Other chronic pain: Secondary | ICD-10-CM

## 2018-01-27 MED ORDER — HYDROCODONE-ACETAMINOPHEN 10-325 MG PO TABS: 1.0000 | ORAL_TABLET | Freq: Four times a day (QID) | ORAL | 0 refills | Status: DC | PRN

## 2018-01-27 NOTE — Telephone Encounter (Signed)
Patient requested [place] Verified Pharmacy Confirmed Pended Rx and sent to [facility] for approval.

## 2018-02-01 ENCOUNTER — Ambulatory Visit (INDEPENDENT_AMBULATORY_CARE_PROVIDER_SITE_OTHER): Payer: Medicare HMO | Admitting: Orthopaedic Surgery

## 2018-02-01 ENCOUNTER — Encounter (INDEPENDENT_AMBULATORY_CARE_PROVIDER_SITE_OTHER): Payer: Self-pay | Admitting: Orthopaedic Surgery

## 2018-02-01 ENCOUNTER — Telehealth: Payer: Self-pay | Admitting: *Deleted

## 2018-02-01 VITALS — BP 118/60 | HR 53 | Ht 62.0 in | Wt 220.0 lb

## 2018-02-01 DIAGNOSIS — G8929 Other chronic pain: Secondary | ICD-10-CM

## 2018-02-01 DIAGNOSIS — M5440 Lumbago with sciatica, unspecified side: Secondary | ICD-10-CM

## 2018-02-01 NOTE — Progress Notes (Signed)
Office Visit Note   Patient: Michelle Buck           Date of Birth: 1936/11/11           MRN: [id number] Visit Date: 02/01/2018              Requested by: [name], NP [place], [place] [address] PCP: [name], NP   Assessment & Plan: Visit Diagnoses:  1. Chronic midline low back pain with sciatica, sciatica laterality unspecified     Plan: Patient has multilevel stenosis she also has multilevel organ system problems including chronic heart failure, chronic kidney failure, COPD, chronic anemia.  Not a candidate for multilevel procedure.  I discussed with her that if she cut back some on her pain medication and probably would not make any difference in her level of pain.  She will try to walk daily using her walker and then rest when she has increased pain.  Follow-Up Instructions: No Follow-up on file.   Orders:  No orders of the defined types were placed in this encounter.  No orders of the defined types were placed in this encounter.     Procedures: No procedures performed   Clinical Data: No additional findings.   Subjective: Chief Complaint  Patient presents with  . Lower Back - Pain    HPI 82 year old female with significant lumbar scoliosis multilevel lumbar disc disease on chronic pain medication Norco 10/325 which she takes every 4 hours.  She states years ago she was told by [facility] that no one should touch her back since it was so severe.  She has history of anemia from chronic kidney failure, COPD, diastolic heart failure, type 2 diabetes.  Previous surgery on her right foot with posterior tibial tendon rupture and valgus fixed now of the hindfoot.  She uses her hands and arms for assistance just to go 5-6 steps going from wheelchair to the chair.  Minimal household ambulator.  Review of Systems he is systems updated positive for type 2 diabetes, PAD, stage IV kidney disease, anemia chronic kidney failure, acute on chronic diastolic  heart failure.  Lower extremity edema, lumbar scoliosis with disc degeneration.   Objective: Vital Signs: BP 118/60   Pulse (!) 53   Ht 5\' 2"  (1.575 m)   Wt 220 lb (99.8 kg)   BMI 40.24 kg/m   Physical Exam  Constitutional:  Ill-appearing pleasant.  She is unable to stand straight upright.  Eyes: EOM are normal.  Neck: No thyromegaly present.  Cardiovascular: Normal rate.  Pulmonary/Chest:  Mild labored breathing with change of position from wheelchair to exam room chair.  Abdominal: Soft.  Skin: Skin is warm. Capillary refill takes less than 2 seconds.    Ortho Exam patients with bilateral lower extremity weakness.  There is right foot hindfoot valgus with posterior tibial tendon insufficiency.  No plantar foot lesions.  Mild lower extremity edema.  Negative sharp pain with hip log roll.  Specialty Comments:  No specialty comments available.  Imaging: Significant scoliosis with multilevel narrowing centrally at T10-11 moderate central T12-L1.  Left foraminal narrowing L1 to severe central lumbar foraminal narrowing L3-4 also severe right and left subarticular narrowing L4-5.  Mild narrowing 5 1.    PMFS History: Patient Active Problem List   Diagnosis Date Noted  . Hypercapnia 05/06/2017  . Osteoarthritis 03/25/2017  . Shoulder pain 03/25/2017  . Abnormality of gait 03/25/2017  . COPD (chronic obstructive pulmonary disease) ([place]) 09/25/2016  .  Diastolic CHF ([place]) 18/29/9371  . Chest pressure 09/25/2016  . Dyspnea and respiratory abnormality 09/23/2016  . OSA (obstructive sleep apnea) 09/02/2016  . Other fatigue 06/23/2016  . PAD (peripheral artery disease) ([place]) 05/21/2016  . Glaucoma 05/21/2016  . Bilateral edema of lower extremity   . Leg edema 01/01/2016  . CKD stage 3 due to type 2 diabetes mellitus ([place])   . CHF (congestive heart failure) ([place]) 10/29/2015  . Chronic obstructive pulmonary disease ([place]) 10/29/2015  . Lumbago   . CKD (chronic kidney disease)     . Chronic kidney disease (CKD), stage IV (severe) ([place]) 09/11/2015  . Diabetes mellitus with diabetic nephropathy, with long-term current use of insulin ([place]) 09/11/2015  . Anemia of chronic kidney failure 09/11/2015  . Morbid obesity ([place]) 08/14/2015  . Peripheral autonomic neuropathy due to diabetes mellitus ([place]) 10/24/2013  . Essential hypertension, benign 02/27/2013  . Acute on chronic diastolic congestive heart failure ([place]) 03/02/2012  . Diabetes mellitus type 2 in obese ([place]) 03/02/2012   Past Medical History:  Diagnosis Date  . Acute bronchitis   . Anxiety   . Arthritis   . Chronic kidney disease, stage II (mild)   . Complications affecting other specified body systems, hypertension   . Congestive heart failure, unspecified    stress test in March at [facility]  . COPD (chronic obstructive pulmonary disease) ([place])    present for several years, diagnosed in the last few years  . Degenerative arthritis of knee, bilateral   . Diabetes mellitus   . Diaphragmatic hernia without mention of obstruction or gangrene   . Disorder of bone and cartilage, unspecified   . GERD (gastroesophageal reflux disease)   . Hiatal hernia   . Hyperlipidemia   . Hypertension   . Hypopotassemia   . Insomnia, unspecified   . Lumbago   . Obese   . Osteoarthrosis, unspecified whether generalized or localized, unspecified site   . PONV (postoperative nausea and vomiting)   . Scoliosis   . Secondary diabetes mellitus with renal manifestations, not stated as uncontrolled, or unspecified(249.40)   . Unspecified glaucoma(365.9)   . Unspecified hereditary and idiopathic peripheral neuropathy     Family History  Problem Relation Age of Onset  . Diabetes Mother   . Heart disease Mother   . Heart disease Brother   . Diabetes Brother   . Kidney disease Father   . Scoliosis Sister   . Diabetes Brother   . Diabetes Brother   . Heart disease Brother   . Scoliosis Brother   . Stroke Brother    . Heart attack Brother     Past Surgical History:  Procedure Laterality Date  . ABDOMINAL HYSTERECTOMY    . COLONOSCOPY  08/06/2011  . FOOT SURGERY    . KNEE ARTHROSCOPY     bilateral  . SHOULDER SURGERY    . TOTAL HIP ARTHROPLASTY     bilateral   Social History   Occupational History  . Occupation: Retired    Fish farm manager: UNEMPLOYED  Tobacco Use  . Smoking status: Former Smoker    Packs/day: 0.50    Years: 3.00    Pack years: 1.50    Types: Cigarettes    Last attempt to quit: 02/27/1997    Years since quitting: 20.9  . Smokeless tobacco: Never Used  . Tobacco comment: remote history - stopped 19 years ago; 20 pack year history  Substance and Sexual Activity  . Alcohol use: No    Alcohol/week: 0.0 oz  .  Drug use: No  . Sexual activity: No

## 2018-02-01 NOTE — Telephone Encounter (Signed)
Received fax from [facility] [phone] for Prior Authorization for patient's Gabapentin 300mg  twice daily.  Placed in folder for Janett Billow to review and fill out. To be faxed back to [facility] Fax: [phone]

## 2018-02-02 ENCOUNTER — Encounter: Payer: Self-pay | Admitting: Adult Health

## 2018-02-02 ENCOUNTER — Ambulatory Visit (INDEPENDENT_AMBULATORY_CARE_PROVIDER_SITE_OTHER): Payer: Medicare HMO | Admitting: Adult Health

## 2018-02-02 DIAGNOSIS — G4733 Obstructive sleep apnea (adult) (pediatric): Secondary | ICD-10-CM

## 2018-02-02 DIAGNOSIS — J449 Chronic obstructive pulmonary disease, unspecified: Secondary | ICD-10-CM

## 2018-02-02 DIAGNOSIS — J441 Chronic obstructive pulmonary disease with (acute) exacerbation: Secondary | ICD-10-CM | POA: Diagnosis not present

## 2018-02-02 MED ORDER — IPRATROPIUM-ALBUTEROL 0.5-2.5 (3) MG/3ML IN SOLN: 3.0000 mL | Freq: Two times a day (BID) | RESPIRATORY_TRACT | 5 refills | Status: DC

## 2018-02-02 MED ORDER — BUDESONIDE 0.25 MG/2ML IN SUSP: RESPIRATORY_TRACT | 5 refills | Status: DC

## 2018-02-02 NOTE — Telephone Encounter (Signed)
Received fax from [facility] [phone] and Gabapentin was APPROVED 11/30/17-11/29/18 Member ID: [id number]

## 2018-02-02 NOTE — Assessment & Plan Note (Signed)
Currently stable on present regimen  Plan  Patient Instructions  Continue on Budesonide and Duoneb Twice daily  .  Please restart CPAP At bedtime   Try to wear for at least 4hrs each night .  Follow up with Dr. Elsworth Soho  In 3 months and As needed

## 2018-02-02 NOTE — Patient Instructions (Signed)
Continue on Budesonide and Duoneb Twice daily  .  Please restart CPAP At bedtime   Try to wear for at least 4hrs each night .  Follow up with Dr. Elsworth Soho  In 3 months and As needed

## 2018-02-02 NOTE — Progress Notes (Signed)
@Patient  ID: Michelle Buck, female    DOB: 1936-05-27, 82 y.o.   MRN: [id number]  Chief Complaint  Patient presents with  . Follow-up    COPD     Referring provider: [name], NP  HPI: 82 year old female former smoker followed for COPD and obstructive sleep apnea  Significant tests/ events  Spirometry 08/2016 shows moderate restriction with a ratio of 80, FVC 59%, FEV1 62% Echo 02/2012 shows grade 2 diastolic dysfunction  NPSG 08/2016 severe AHI 66/h, CPAP 16 cm  02/02/2018 Follow up : COPD and OSA  Patient presents for a six-month follow-up.  Patient has underlying COPD . She is on Pulmicort and DuoNeb nebulizer twice daily.  Says that her shortness of breath is a same.  She gets winded with activities at times.  She denies any flare of her cough.  She denies any fever chest pain orthopnea or edema. Flu shot is up-to-date  Has Severe OSA but says not been wearing her CPAP .  Says she can not tolerate the mask she likes.  We looked at various mask and she would like to try a full face mask dream wear  She lives by herself. Her son was just shot and killed. Support provided.       Allergies  Allergen Reactions  . Tramadol Other (See Comments)    Leg cramps   . Codeine Nausea And Vomiting    Patient states N/V with codeine  . Hydrocodone Nausea And Vomiting  . Penicillins Rash    Patient states rash/itch with penicillin Has patient had a PCN reaction causing immediate rash, facial/tongue/throat swelling, SOB or lightheadedness with hypotension: Yes- broke me out and i was itching Has patient had a PCN reaction causing severe rash involving mucus membranes or skin necrosis: No Has patient had a PCN reaction that required hospitalization Yes- i was already in the hospital Has patient had a PCN reaction occurring within the last 10 years: No If all of the above answers are "NO  . Tylenol [Acetaminophen] Hives    Immunization History  Administered Date(s)  Administered  . Influenza Split 09/20/2012  . Influenza, High Dose Seasonal PF 09/02/2016  . Influenza,inj,Quad PF,6+ Mos 10/24/2013, 10/10/2014, 08/20/2015, 01/12/2018  . Pneumococcal Conjugate-13 02/19/2015  . Pneumococcal Polysaccharide-23 12/01/2011    Past Medical History:  Diagnosis Date  . Acute bronchitis   . Anxiety   . Arthritis   . Chronic kidney disease, stage II (mild)   . Complications affecting other specified body systems, hypertension   . Congestive heart failure, unspecified    stress test in March at [facility]  . COPD (chronic obstructive pulmonary disease) ([place])    present for several years, diagnosed in the last few years  . Degenerative arthritis of knee, bilateral   . Diabetes mellitus   . Diaphragmatic hernia without mention of obstruction or gangrene   . Disorder of bone and cartilage, unspecified   . GERD (gastroesophageal reflux disease)   . Hiatal hernia   . Hyperlipidemia   . Hypertension   . Hypopotassemia   . Insomnia, unspecified   . Lumbago   . Obese   . Osteoarthrosis, unspecified whether generalized or localized, unspecified site   . PONV (postoperative nausea and vomiting)   . Scoliosis   . Secondary diabetes mellitus with renal manifestations, not stated as uncontrolled, or unspecified(249.40)   . Unspecified glaucoma(365.9)   . Unspecified hereditary and idiopathic peripheral neuropathy     Tobacco History: Social History  Tobacco Use  Smoking Status Former Smoker  . Packs/day: 0.50  . Years: 3.00  . Pack years: 1.50  . Types: Cigarettes  . Last attempt to quit: 02/27/1997  . Years since quitting: 20.9  Smokeless Tobacco Never Used  Tobacco Comment   remote history - stopped 19 years ago; 20 pack year history   Counseling given: Not Answered Comment: remote history - stopped 19 years ago; 20 pack year history   Outpatient Encounter Medications as of 02/02/2018  Medication Sig  . albuterol (PROVENTIL HFA;VENTOLIN  HFA) 108 (90 Base) MCG/ACT inhaler Inhale 2 puffs into the lungs every 4 (four) hours as needed for wheezing or shortness of breath.  . AMITIZA 24 MCG capsule TAKE 1 CAPSULE BY MOUTH 2 TIMES A DAY WITH MEALS  . aspirin EC 81 MG tablet Take 81 mg by mouth daily.  . budesonide (PULMICORT) 0.25 MG/2ML nebulizer solution Take one ampule in Neb twice daily routinely for COPD  J44.1  . Cholecalciferol (VITAMIN D3) 2000 units TABS Take 2,000 Units by mouth daily.  . cilostazol (PLETAL) 100 MG tablet TAKE 1 TABLET BY MOUTH ONCE DAILY  . DULoxetine (CYMBALTA) 30 MG capsule TAKE 1 CAPSULE BY MOUTH EVERY DAY TO HELP ANXIETY AND PAINS.  Marland Kitchen EASY TOUCH INSULIN SYRINGE 31G X 5/16" 0.5 ML MISC USE UP TO 2 TIMES A DAY AS DIRECTED.  Marland Kitchen esomeprazole (NEXIUM) 40 MG capsule TAKE 1 CAPSULE BY MOUTH ONCE DAILY  . hydrALAZINE (APRESOLINE) 50 MG tablet TAKE 1 TABLET BY MOUTH THREE TIMES A DAY  . insulin aspart protamine- aspart (NOVOLOG MIX 70/30) (70-30) 100 UNIT/ML injection Inject 40 units subcutaneously with breakfast and inject 25 units subcutaneously with dinner to control blood sugar  . insulin aspart protamine- aspart (NOVOLOG MIX 70/30) (70-30) 100 UNIT/ML injection INJECT 40 UNITS SUBCUTANEOUSLY WITH BREAKFAST AND INJECT 25 UNITS SUBCUTANEOUSLY WITH DINNER TO CONTROL BLOOD SUGAR.  . metoprolol succinate (TOPROL-XL) 25 MG 24 hr tablet TAKE 1 TABLET BY MOUTH ONCE DAILY WITH OR IMMEDIATELY FOLLOWING A MEAL FOR BLOOD PRESSURE  . potassium chloride SA (K-DUR,KLOR-CON) 20 MEQ tablet TAKE 1 TABLET BY MOUTH ONCE DAILY  . simvastatin (ZOCOR) 20 MG tablet TAKE 1 TABLET BY MOUTH ONCE DAILY  . sucralfate (CARAFATE) 1 g tablet TAKE 1 TABLET BY MOUTH TWICE DAILY ON EMPTY STOMACH  . timolol (TIMOPTIC) 0.5 % ophthalmic solution INSTILL 1 DROP INTO EACH EYE TWICE A DAY  . TRAVATAN Z 0.004 % SOLN ophthalmic solution Place 1 drop into both eyes at bedtime.   . diclofenac sodium (VOLTAREN) 1 % GEL APPLY 4 GRAMS TOPICALLY TWICE DAILY  AS NEEDED FOR PAIN (Patient not taking: Reported on 02/02/2018)  . furosemide (LASIX) 40 MG tablet TAKE 1 TABLET BY MOUTH TWICE A DAY (Patient not taking: Reported on 02/02/2018)  . gabapentin (NEURONTIN) 300 MG capsule Take 1 capsule (300 mg total) by mouth 2 (two) times daily. (Patient not taking: Reported on 02/02/2018)  . guaifenesin (ROBITUSSIN) 100 MG/5ML syrup Take 200 mg by mouth 3 (three) times daily as needed for cough.  Marland Kitchen HYDROcodone-acetaminophen (NORCO) 10-325 MG tablet Take 1 tablet by mouth every 6 (six) hours as needed. (Patient not taking: Reported on 02/02/2018)  . ipratropium-albuterol (DUONEB) 0.5-2.5 (3) MG/3ML SOLN Take 50mls by nebulization every 6 hours as needed for SOB/Wheezing (Patient not taking: Reported on 02/02/2018)  . ondansetron (ZOFRAN ODT) 8 MG disintegrating tablet Take 1 tablet (8 mg total) by mouth every 8 (eight) hours as needed for nausea or vomiting. (  Patient not taking: Reported on 02/02/2018)  . tamsulosin (FLOMAX) 0.4 MG CAPS capsule Take 1 capsule (0.4 mg total) by mouth daily. (Patient not taking: Reported on 02/02/2018)   No facility-administered encounter medications on file as of 02/02/2018.      Review of Systems  Constitutional:   No  weight loss, night sweats,  Fevers, chills,  +fatigue, or  lassitude.  HEENT:   No headaches,  Difficulty swallowing,  Tooth/dental problems, or  Sore throat,                No sneezing, itching, ear ache, nasal congestion, post nasal drip,   CV:  No chest pain,  Orthopnea, PND, swelling in lower extremities, anasarca, dizziness, palpitations, syncope.   GI  No heartburn, indigestion, abdominal pain, nausea, vomiting, diarrhea, change in bowel habits, loss of appetite, bloody stools.   Resp: .  No chest wall deformity  Skin: no rash or lesions.  GU: no dysuria, change in color of urine, no urgency or frequency.  No flank pain, no hematuria   MS:  No joint pain or swelling.  No decreased range of motion.  No back  pain.    Physical Exam  BP 120/68 (BP Location: Right Arm, Cuff Size: Normal)   Pulse (!) 52   Ht 5\' 2"  (1.575 m)   Wt 201 lb (91.2 kg)   SpO2 92%   BMI 36.76 kg/m   GEN: A/Ox3; pleasant , NAD, obese   HEENT:  [place]/AT,  EACs-clear, TMs-wnl, NOSE-clear, THROAT-clear, no lesions, no postnasal drip or exudate noted.  Poor dentition  NECK:  Supple w/ fair ROM; no JVD; normal carotid impulses w/o bruits; no thyromegaly or nodules palpated; no lymphadenopathy.    RESP  Clear  P & A; w/o, wheezes/ rales/ or rhonchi. no accessory muscle use, no dullness to percussion  CARD:  RRR, no m/r/g, no peripheral edema, pulses intact, no cyanosis or clubbing.  GI:   Soft & nt; nml bowel sounds; no organomegaly or masses detected.   Musco: Warm bil, no deformities or joint swelling noted.   Neuro: alert, no focal deficits noted.    Skin: Warm, no lesions or rashes    Lab Results:  CBC    Imaging: No results found.   Assessment & Plan:   Chronic obstructive pulmonary disease ([place]) Currently stable on present regimen  Plan  Patient Instructions  Continue on Budesonide and Duoneb Twice daily  .  Please restart CPAP At bedtime   Try to wear for at least 4hrs each night .  Follow up with Dr. Elsworth Soho  In 3 months and As needed        OSA (obstructive sleep apnea) CPAP intolerance.  Patient is encouraged on improved compliance.  She is going to try a new f full mask  Plan  Patient Instructions  Continue on Budesonide and Duoneb Twice daily  .  Please restart CPAP At bedtime   Try to wear for at least 4hrs each night .  Follow up with Dr. Elsworth Soho  In 3 months and As needed           Rexene Edison, NP 02/02/2018

## 2018-02-02 NOTE — Assessment & Plan Note (Signed)
CPAP intolerance.  Patient is encouraged on improved compliance.  She is going to try a new f full mask  Plan  Patient Instructions  Continue on Budesonide and Duoneb Twice daily  .  Please restart CPAP At bedtime   Try to wear for at least 4hrs each night .  Follow up with Dr. Elsworth Soho  In 3 months and As needed

## 2018-02-02 NOTE — Addendum Note (Signed)
Addended by: Parke Poisson E on: 02/02/2018 12:42 PM   Modules accepted: Orders

## 2018-02-03 ENCOUNTER — Telehealth: Payer: Self-pay | Admitting: Adult Health

## 2018-02-03 DIAGNOSIS — J449 Chronic obstructive pulmonary disease, unspecified: Secondary | ICD-10-CM | POA: Diagnosis not present

## 2018-02-03 NOTE — Telephone Encounter (Signed)
Called and spoke with Katie from [facility] she stated that they were able to go onto the cone chart and get the information that was needed. Nothing further needed at this time.

## 2018-02-08 ENCOUNTER — Ambulatory Visit (INDEPENDENT_AMBULATORY_CARE_PROVIDER_SITE_OTHER): Payer: Medicare HMO

## 2018-02-08 ENCOUNTER — Telehealth: Payer: Self-pay

## 2018-02-08 VITALS — BP 120/70 | HR 52 | Temp 97.8°F | Ht 62.0 in

## 2018-02-08 DIAGNOSIS — Z0000 Encounter for general adult medical examination without abnormal findings: Secondary | ICD-10-CM

## 2018-02-08 MED ORDER — TETANUS-DIPHTH-ACELL PERTUSSIS 5-2.5-18.5 LF-MCG/0.5 IM SUSP: 0.5000 mL | Freq: Once | INTRAMUSCULAR | 0 refills | Status: AC

## 2018-02-08 NOTE — Telephone Encounter (Signed)
Called patient and informed her.

## 2018-02-08 NOTE — Telephone Encounter (Signed)
-----   Message from [name], NP sent at 02/08/2018  1:20 PM EDT ----- She can try OTC aspercream with lidocaine  ----- Message ----- From: Titus Dubin, RN Sent: 02/08/2018  10:52 AM To: [name], NP  Pulse 52  Pt said medicare stopped paying for her voltaren cream and was wondering if there was another brand you could order for her instead

## 2018-02-08 NOTE — Progress Notes (Signed)
Subjective:   Michelle Buck is a 82 y.o. female who presents for Medicare Annual (Subsequent) preventive examination.  Last AWV-10/27/2016       Objective:     Vitals: BP 120/70 (BP Location: Left Arm, Patient Position: Sitting)   Pulse (!) 52   Temp 97.8 F (36.6 C) (Oral)   Ht 5\' 2"  (1.575 m)   SpO2 93%   BMI 36.76 kg/m   Body mass index is 36.76 kg/m.  Advanced Directives 02/08/2018 10/11/2017 06/28/2017 05/25/2017 05/17/2017 05/06/2017 05/06/2017  Does Patient Have a Medical Advance Directive? No No No No No No No  Type of Advance Directive - - - - - - -  Does patient want to make changes to medical advance directive? - - - - - - -  Copy of [place] in Chart? - - - - - - -  Would patient like information on creating a medical advance directive? Yes (MAU/Ambulatory/Procedural Areas - Information given) No - Patient declined - - - No - Patient declined -  Pre-existing out of facility DNR order (yellow form or pink MOST form) - - - - - - -    Tobacco Social History   Tobacco Use  Smoking Status Former Smoker  . Packs/day: 0.50  . Years: 3.00  . Pack years: 1.50  . Types: Cigarettes  . Last attempt to quit: 02/27/1997  . Years since quitting: 20.9  Smokeless Tobacco Never Used  Tobacco Comment   remote history - stopped 19 years ago; 20 pack year history     Counseling given: Not Answered Comment: remote history - stopped 19 years ago; 20 pack year history   Clinical Intake:  Pre-visit preparation completed: No              Interpreter Needed?: No  Information entered by :: Tyson Dense, RN  Past Medical History:  Diagnosis Date  . Acute bronchitis   . Anxiety   . Arthritis   . Chronic kidney disease, stage II (mild)   . Complications affecting other specified body systems, hypertension   . Congestive heart failure, unspecified    stress test in March at [facility]  . COPD (chronic obstructive pulmonary disease) ([place])    present for several years, diagnosed in the last few years  . Degenerative arthritis of knee, bilateral   . Diabetes mellitus   . Diaphragmatic hernia without mention of obstruction or gangrene   . Disorder of bone and cartilage, unspecified   . GERD (gastroesophageal reflux disease)   . Hiatal hernia   . Hyperlipidemia   . Hypertension   . Hypopotassemia   . Insomnia, unspecified   . Lumbago   . Obese   . Osteoarthrosis, unspecified whether generalized or localized, unspecified site   . PONV (postoperative nausea and vomiting)   . Scoliosis   . Secondary diabetes mellitus with renal manifestations, not stated as uncontrolled, or unspecified(249.40)   . Unspecified glaucoma(365.9)   . Unspecified hereditary and idiopathic peripheral neuropathy    Past Surgical History:  Procedure Laterality Date  . ABDOMINAL HYSTERECTOMY    . COLONOSCOPY  08/06/2011  . FOOT SURGERY    . KNEE ARTHROSCOPY     bilateral  . SHOULDER SURGERY    . TOTAL HIP ARTHROPLASTY     bilateral   Family History  Problem Relation Age of Onset  . Diabetes Mother   . Heart disease Mother   . Heart disease Brother   . Diabetes Brother   .  Kidney disease Father   . Scoliosis Sister   . Diabetes Brother   . Diabetes Brother   . Heart disease Brother   . Scoliosis Brother   . Stroke Brother   . Heart attack Brother    Social History   Socioeconomic History  . Marital status: Widowed    Spouse name: None  . Number of children: None  . Years of education: None  . Highest education level: None  Social Needs  . Financial resource strain: Not very hard  . Food insecurity - worry: Never true  . Food insecurity - inability: Never true  . Transportation needs - medical: No  . Transportation needs - non-medical: No  Occupational History  . Occupation: Retired    Fish farm manager: UNEMPLOYED  Tobacco Use  . Smoking status: Former Smoker    Packs/day: 0.50    Years: 3.00    Pack years: 1.50    Types:  Cigarettes    Last attempt to quit: 02/27/1997    Years since quitting: 20.9  . Smokeless tobacco: Never Used  . Tobacco comment: remote history - stopped 19 years ago; 20 pack year history  Substance and Sexual Activity  . Alcohol use: No    Alcohol/week: 0.0 oz  . Drug use: No  . Sexual activity: No  Other Topics Concern  . None  Social History Narrative   Widowed   No children   Never held a steady job    Outpatient Encounter Medications as of 02/08/2018  Medication Sig  . albuterol (PROVENTIL HFA;VENTOLIN HFA) 108 (90 Base) MCG/ACT inhaler Inhale 2 puffs into the lungs every 4 (four) hours as needed for wheezing or shortness of breath.  . AMITIZA 24 MCG capsule TAKE 1 CAPSULE BY MOUTH 2 TIMES A DAY WITH MEALS  . aspirin EC 81 MG tablet Take 81 mg by mouth daily.  . budesonide (PULMICORT) 0.25 MG/2ML nebulizer solution Take one ampule in Neb twice daily routinely for COPD  J44.1  . Cholecalciferol (VITAMIN D3) 2000 units TABS Take 2,000 Units by mouth daily.  . cilostazol (PLETAL) 100 MG tablet TAKE 1 TABLET BY MOUTH ONCE DAILY  . diclofenac sodium (VOLTAREN) 1 % GEL APPLY 4 GRAMS TOPICALLY TWICE DAILY AS NEEDED FOR PAIN  . DULoxetine (CYMBALTA) 30 MG capsule TAKE 1 CAPSULE BY MOUTH EVERY DAY TO HELP ANXIETY AND PAINS.  Marland Kitchen EASY TOUCH INSULIN SYRINGE 31G X 5/16" 0.5 ML MISC USE UP TO 2 TIMES A DAY AS DIRECTED.  Marland Kitchen esomeprazole (NEXIUM) 40 MG capsule TAKE 1 CAPSULE BY MOUTH ONCE DAILY  . furosemide (LASIX) 40 MG tablet TAKE 1 TABLET BY MOUTH TWICE A DAY  . gabapentin (NEURONTIN) 300 MG capsule Take 1 capsule (300 mg total) by mouth 2 (two) times daily.  Marland Kitchen guaifenesin (ROBITUSSIN) 100 MG/5ML syrup Take 200 mg by mouth 3 (three) times daily as needed for cough.  . hydrALAZINE (APRESOLINE) 50 MG tablet TAKE 1 TABLET BY MOUTH THREE TIMES A DAY  . HYDROcodone-acetaminophen (NORCO) 10-325 MG tablet Take 1 tablet by mouth every 6 (six) hours as needed.  . insulin aspart protamine- aspart  (NOVOLOG MIX 70/30) (70-30) 100 UNIT/ML injection Inject 40 units subcutaneously with breakfast and inject 25 units subcutaneously with dinner to control blood sugar  . insulin aspart protamine- aspart (NOVOLOG MIX 70/30) (70-30) 100 UNIT/ML injection INJECT 40 UNITS SUBCUTANEOUSLY WITH BREAKFAST AND INJECT 25 UNITS SUBCUTANEOUSLY WITH DINNER TO CONTROL BLOOD SUGAR.  Marland Kitchen ipratropium-albuterol (DUONEB) 0.5-2.5 (3) MG/3ML SOLN Take 3 mLs  by nebulization 2 (two) times daily. Dx: J44.1  . metoprolol succinate (TOPROL-XL) 25 MG 24 hr tablet TAKE 1 TABLET BY MOUTH ONCE DAILY WITH OR IMMEDIATELY FOLLOWING A MEAL FOR BLOOD PRESSURE  . ondansetron (ZOFRAN ODT) 8 MG disintegrating tablet Take 1 tablet (8 mg total) by mouth every 8 (eight) hours as needed for nausea or vomiting.  . potassium chloride SA (K-DUR,KLOR-CON) 20 MEQ tablet TAKE 1 TABLET BY MOUTH ONCE DAILY  . simvastatin (ZOCOR) 20 MG tablet TAKE 1 TABLET BY MOUTH ONCE DAILY  . sucralfate (CARAFATE) 1 g tablet TAKE 1 TABLET BY MOUTH TWICE DAILY ON EMPTY STOMACH  . tamsulosin (FLOMAX) 0.4 MG CAPS capsule Take 1 capsule (0.4 mg total) by mouth daily.  . Tdap (BOOSTRIX) 5-2.5-18.5 LF-MCG/0.5 injection Inject 0.5 mLs into the muscle once for 1 dose.  . timolol (TIMOPTIC) 0.5 % ophthalmic solution INSTILL 1 DROP INTO EACH EYE TWICE A DAY  . TRAVATAN Z 0.004 % SOLN ophthalmic solution Place 1 drop into both eyes at bedtime.   . [DISCONTINUED] Tdap (BOOSTRIX) 5-2.5-18.5 LF-MCG/0.5 injection Inject 0.5 mLs into the muscle once.   No facility-administered encounter medications on file as of 02/08/2018.     Activities of Daily Living In your present state of health, do you have any difficulty performing the following activities: 02/08/2018 05/06/2017  Hearing? Y N  Vision? N N  Difficulty concentrating or making decisions? Tempie Donning  Walking or climbing stairs? Y Y  Dressing or bathing? Y Y  Doing errands, shopping? Tempie Donning  Preparing Food and eating ? N -  Using  the Toilet? Y -  In the past six months, have you accidently leaked urine? N -  Do you have problems with loss of bowel control? N -  Managing your Medications? Y -  Managing your Finances? Y -  Housekeeping or managing your Housekeeping? Y -  Some recent data might be hidden    Patient Care Team: [name], NP as PCP - General (Nurse Practitioner) [name], MD as Consulting Physician (Ophthalmology) [name], MD as Consulting Physician (Nephrology)    Assessment:   This is a routine wellness examination for Landon.  Exercise Activities and Dietary recommendations Current Exercise Habits: The patient does not participate in regular exercise at present, Exercise limited by: orthopedic condition(s)  Goals    . Weight (lb) < 200 lb (90.7 kg)     Starting 10/27/16, I will attempt to lose 15 lbs, to get under 200lbs.        Fall Risk Fall Risk  02/08/2018 01/12/2018 10/11/2017 06/28/2017 06/21/2017  Falls in the past year? No No No Yes No  Number falls in past yr: - - - 2 or more -  Injury with Fall? - - - Yes -  Comment - - - - -  Risk for fall due to : - - - - -  Follow up - - - - -   Is the patient's home free of loose throw rugs in walkways, pet beds, electrical cords, etc?   yes      Grab bars in the bathroom? yes      Handrails on the stairs?   yes      Adequate lighting?   yes  Depression Screen PHQ 2/9 Scores 02/08/2018 01/12/2018 11/05/2016 10/27/2016  PHQ - 2 Score 0 0 0 0     Cognitive Function: done within last year MMSE - Mini Mental State Exam 10/11/2017 10/27/2016  Orientation to time  5 5  Orientation to Place 3 3  Registration 3 3  Attention/ Calculation 0 3  Attention/Calculation-comments pt unable to read or spell world/calculate due to low education -  Recall 2 3  Language- name 2 objects 2 2  Language- repeat 1 1  Language- follow 3 step command 1 3  Language- read & follow direction 0 0  Language-read & follow  direction-comments unable to read -  Write a sentence 0 0  Write a sentence-comments unable to read or spell due to low education -  Copy design 0 0  Total score 17 23        Immunization History  Administered Date(s) Administered  . Influenza Split 09/20/2012  . Influenza, High Dose Seasonal PF 09/02/2016  . Influenza,inj,Quad PF,6+ Mos 10/24/2013, 10/10/2014, 08/20/2015, 01/12/2018  . Pneumococcal Conjugate-13 02/19/2015  . Pneumococcal Polysaccharide-23 12/01/2011    Qualifies for Shingles Vaccine? No, pt stated she never had chicken pox  Screening Tests Health Maintenance  Topic Date Due  . TETANUS/TDAP  11/30/2018 (Originally 06/06/1955)  . OPHTHALMOLOGY EXAM  06/14/2018  . HEMOGLOBIN A1C  06/26/2018  . URINE MICROALBUMIN  09/30/2018  . FOOT EXAM  01/12/2019  . INFLUENZA VACCINE  Completed  . DEXA SCAN  Completed  . PNA vac Low Risk Adult  Completed    Cancer Screenings: Lung: Low Dose CT Chest recommended if Age 67-80 years, 30 pack-year currently smoking OR have quit w/in 15years. Patient does not qualify. Breast:  Up to date on Mammogram? Yes   Up to date of Bone Density/Dexa? Yes Colorectal: up to date  Additional Screenings: Hepatitis C Screening: declined     Plan:      I have personally reviewed and addressed the Medicare Annual Wellness questionnaire and have noted the following in the patient's chart:  A. Medical and social history B. Use of alcohol, tobacco or illicit drugs  C. Current medications and supplements D. Functional ability and status E.  Nutritional status F.  Physical activity G. Advance directives H. List of other physicians I.  Hospitalizations, surgeries, and ER visits in previous 12 months J.  [place] to include hearing, vision, cognitive, depression L. Referrals and appointments - none  In addition, I have reviewed and discussed with patient certain preventive protocols, quality metrics, and best practice  recommendations. A written personalized care plan for preventive services as well as general preventive health recommendations were provided to patient.  See attached scanned questionnaire for additional information.   Signed,   Tyson Dense, RN Nurse Health Advisor  Patient Concerns: None

## 2018-02-08 NOTE — Patient Instructions (Addendum)
Michelle Buck , Thank you for taking time to come for your Medicare Wellness Visit. I appreciate your ongoing commitment to your health goals. Please review the following plan we discussed and let me know if I can assist you in the future.   Screening recommendations/referrals: Colonoscopy excluded, you are over age 80 Mammogram excluded, you are over age 92 Bone Density up to date Recommended yearly ophthalmology/optometry visit for glaucoma screening and checkup Recommended yearly dental visit for hygiene and checkup  Vaccinations: Influenza vaccine up to date, due 2019 fall season Pneumococcal vaccine up to date, completed Tdap vaccine due, prescription sent to pharmacy Shingles vaccine excluded, never had chicken pox    Advanced directives: Advance directive discussed with you today. I have provided a copy for you to complete at home and have notarized. Once this is complete please bring a copy in to our office so we can scan it into your chart.  Conditions/risks identified: none  Next appointment: Tyson Dense, RN 02/10/2019 @ 10am   Preventive Care 65 Years and Older, Female Preventive care refers to lifestyle choices and visits with your health care provider that can promote health and wellness. What does preventive care include?  A yearly physical exam. This is also called an annual well check.  Dental exams once or twice a year.  Routine eye exams. Ask your health care provider how often you should have your eyes checked.  Personal lifestyle choices, including:  Daily care of your teeth and gums.  Regular physical activity.  Eating a healthy diet.  Avoiding tobacco and drug use.  Limiting alcohol use.  Practicing safe sex.  Taking low-dose aspirin every day.  Taking vitamin and mineral supplements as recommended by your health care provider. What happens during an annual well check? The services and screenings done by your health care provider during your  annual well check will depend on your age, overall health, lifestyle risk factors, and family history of disease. Counseling  Your health care provider may ask you questions about your:  Alcohol use.  Tobacco use.  Drug use.  Emotional well-being.  Home and relationship well-being.  Sexual activity.  Eating habits.  History of falls.  Memory and ability to understand (cognition).  Work and work Statistician.  Reproductive health. Screening  You may have the following tests or measurements:  Height, weight, and BMI.  Blood pressure.  Lipid and cholesterol levels. These may be checked every 5 years, or more frequently if you are over 27 years old.  Skin check.  Lung cancer screening. You may have this screening every year starting at age 48 if you have a 30-pack-year history of smoking and currently smoke or have quit within the past 15 years.  Fecal occult blood test (FOBT) of the stool. You may have this test every year starting at age 31.  Flexible sigmoidoscopy or colonoscopy. You may have a sigmoidoscopy every 5 years or a colonoscopy every 10 years starting at age 60.  Hepatitis C blood test.  Hepatitis B blood test.  Sexually transmitted disease (STD) testing.  Diabetes screening. This is done by checking your blood sugar (glucose) after you have not eaten for a while (fasting). You may have this done every 1-3 years.  Bone density scan. This is done to screen for osteoporosis. You may have this done starting at age 54.  Mammogram. This may be done every 1-2 years. Talk to your health care provider about how often you should have regular mammograms. Talk with  your health care provider about your test results, treatment options, and if necessary, the need for more tests. Vaccines  Your health care provider may recommend certain vaccines, such as:  Influenza vaccine. This is recommended every year.  Tetanus, diphtheria, and acellular pertussis (Tdap, Td)  vaccine. You may need a Td booster every 10 years.  Zoster vaccine. You may need this after age 71.  Pneumococcal 13-valent conjugate (PCV13) vaccine. One dose is recommended after age 27.  Pneumococcal polysaccharide (PPSV23) vaccine. One dose is recommended after age 16. Talk to your health care provider about which screenings and vaccines you need and how often you need them. This information is not intended to replace advice given to you by your health care provider. Make sure you discuss any questions you have with your health care provider. Document Released: 12/13/2015 Document Revised: 08/05/2016 Document Reviewed: 09/17/2015 Elsevier Interactive Patient Education  2017 [place] Prevention in the Home Falls can cause injuries. They can happen to people of all ages. There are many things you can do to make your home safe and to help prevent falls. What can I do on the outside of my home?  Regularly fix the edges of walkways and driveways and fix any cracks.  Remove anything that might make you trip as you walk through a door, such as a raised step or threshold.  Trim any bushes or trees on the path to your home.  Use bright outdoor lighting.  Clear any walking paths of anything that might make someone trip, such as rocks or tools.  Regularly check to see if handrails are loose or broken. Make sure that both sides of any steps have handrails.  Any raised decks and porches should have guardrails on the edges.  Have any leaves, snow, or ice cleared regularly.  Use sand or salt on walking paths during winter.  Clean up any spills in your garage right away. This includes oil or grease spills. What can I do in the bathroom?  Use night lights.  Install grab bars by the toilet and in the tub and shower. Do not use towel bars as grab bars.  Use non-skid mats or decals in the tub or shower.  If you need to sit down in the shower, use a plastic, non-slip  stool.  Keep the floor dry. Clean up any water that spills on the floor as soon as it happens.  Remove soap buildup in the tub or shower regularly.  Attach bath mats securely with double-sided non-slip rug tape.  Do not have throw rugs and other things on the floor that can make you trip. What can I do in the bedroom?  Use night lights.  Make sure that you have a light by your bed that is easy to reach.  Do not use any sheets or blankets that are too big for your bed. They should not hang down onto the floor.  Have a firm chair that has side arms. You can use this for support while you get dressed.  Do not have throw rugs and other things on the floor that can make you trip. What can I do in the kitchen?  Clean up any spills right away.  Avoid walking on wet floors.  Keep items that you use a lot in easy-to-reach places.  If you need to reach something above you, use a strong step stool that has a grab bar.  Keep electrical cords out of the way.  Do not  use floor polish or wax that makes floors slippery. If you must use wax, use non-skid floor wax.  Do not have throw rugs and other things on the floor that can make you trip. What can I do with my stairs?  Do not leave any items on the stairs.  Make sure that there are handrails on both sides of the stairs and use them. Fix handrails that are broken or loose. Make sure that handrails are as long as the stairways.  Check any carpeting to make sure that it is firmly attached to the stairs. Fix any carpet that is loose or worn.  Avoid having throw rugs at the top or bottom of the stairs. If you do have throw rugs, attach them to the floor with carpet tape.  Make sure that you have a light switch at the top of the stairs and the bottom of the stairs. If you do not have them, ask someone to add them for you. What else can I do to help prevent falls?  Wear shoes that:  Do not have high heels.  Have rubber bottoms.  Are  comfortable and fit you well.  Are closed at the toe. Do not wear sandals.  If you use a stepladder:  Make sure that it is fully opened. Do not climb a closed stepladder.  Make sure that both sides of the stepladder are locked into place.  Ask someone to hold it for you, if possible.  Clearly mark and make sure that you can see:  Any grab bars or handrails.  First and last steps.  Where the edge of each step is.  Use tools that help you move around (mobility aids) if they are needed. These include:  Canes.  Walkers.  Scooters.  Crutches.  Turn on the lights when you go into a dark area. Replace any light bulbs as soon as they burn out.  Set up your furniture so you have a clear path. Avoid moving your furniture around.  If any of your floors are uneven, fix them.  If there are any pets around you, be aware of where they are.  Review your medicines with your doctor. Some medicines can make you feel dizzy. This can increase your chance of falling. Ask your doctor what other things that you can do to help prevent falls. This information is not intended to replace advice given to you by your health care provider. Make sure you discuss any questions you have with your health care provider. Document Released: 09/12/2009 Document Revised: 04/23/2016 Document Reviewed: 12/21/2014 Elsevier Interactive Patient Education  2017 [employer].

## 2018-02-12 DIAGNOSIS — J449 Chronic obstructive pulmonary disease, unspecified: Secondary | ICD-10-CM | POA: Diagnosis not present

## 2018-02-16 ENCOUNTER — Telehealth: Payer: Self-pay

## 2018-02-16 NOTE — Telephone Encounter (Signed)
Message left on clinical intake voicemail:   A 3-way message was left on voicemail by patient and unknown female from an unknown company. The incoming call phone number was [phone]. The female was coaching patient to say she was interested in a support (specifics not mentioned). Patient stated " I would like a support the pain medications are not helping."   I called patient to confirm her interest in medical equipment and to question what type of support she was referring to. Patient confirmed that she would like a support yet had no idea what type of support she was referring to. Ms.Morsch stated the company will submit a fax. Ms.Peloso could not recall the name of the company.  Will await fax   I called the number that call was received from to inquire about the name of company, company is  [employer], receptionist states they faxed [place] paperwork before and the there was some confusion with the patient for she thought she would have to pay for medical equipment, yet her insurance will pay for it.

## 2018-02-18 ENCOUNTER — Telehealth: Payer: Self-pay | Admitting: *Deleted

## 2018-02-18 DIAGNOSIS — H401131 Primary open-angle glaucoma, bilateral, mild stage: Secondary | ICD-10-CM | POA: Diagnosis not present

## 2018-02-18 NOTE — Telephone Encounter (Signed)
Patient called requesting refill on her Pain medication.  [place] Verified LR: 01/27/18 Called and [facility] for patient,  that medication is not due until next Friday

## 2018-02-19 DIAGNOSIS — J449 Chronic obstructive pulmonary disease, unspecified: Secondary | ICD-10-CM | POA: Diagnosis not present

## 2018-02-21 ENCOUNTER — Other Ambulatory Visit: Payer: Self-pay | Admitting: Nurse Practitioner

## 2018-02-21 ENCOUNTER — Other Ambulatory Visit: Payer: Self-pay | Admitting: Internal Medicine

## 2018-02-21 DIAGNOSIS — R69 Illness, unspecified: Secondary | ICD-10-CM | POA: Diagnosis not present

## 2018-02-22 NOTE — Telephone Encounter (Signed)
Late Entry:  I checked with Rodena Piety in [place], Rodena Piety has yet to receive any request for medical equipment from [place]

## 2018-02-23 ENCOUNTER — Other Ambulatory Visit: Payer: Self-pay | Admitting: *Deleted

## 2018-02-23 DIAGNOSIS — G8929 Other chronic pain: Secondary | ICD-10-CM

## 2018-02-23 DIAGNOSIS — M545 Low back pain: Principal | ICD-10-CM

## 2018-02-23 MED ORDER — HYDROCODONE-ACETAMINOPHEN 10-325 MG PO TABS: 1.0000 | ORAL_TABLET | Freq: Four times a day (QID) | ORAL | 0 refills | Status: DC | PRN

## 2018-02-23 NOTE — Addendum Note (Signed)
Addended by: Rafael Bihari A on: 02/23/2018 02:39 PM   Modules accepted: Orders

## 2018-02-23 NOTE — Telephone Encounter (Signed)
Patient called requesting Narcotic Refill [place] Database Verified LR: 01/27/18 Pharmacy Confirmed Pended Rx and sent to [facility] for approval.

## 2018-02-23 NOTE — Telephone Encounter (Signed)
Spoke with Janett Billow. Pended Rx and resent to [place] for approval.

## 2018-03-03 DIAGNOSIS — H6123 Impacted cerumen, bilateral: Secondary | ICD-10-CM | POA: Diagnosis not present

## 2018-03-04 ENCOUNTER — Telehealth: Payer: Self-pay | Admitting: *Deleted

## 2018-03-04 NOTE — Telephone Encounter (Signed)
Received fax from [facility] for prior authorization for Gabapentin. Placed in [place] folder to fill out and sign.  ID#:[id number]

## 2018-03-11 DIAGNOSIS — J449 Chronic obstructive pulmonary disease, unspecified: Secondary | ICD-10-CM | POA: Diagnosis not present

## 2018-03-15 DIAGNOSIS — J449 Chronic obstructive pulmonary disease, unspecified: Secondary | ICD-10-CM | POA: Diagnosis not present

## 2018-03-17 ENCOUNTER — Telehealth: Payer: Self-pay

## 2018-03-17 DIAGNOSIS — R69 Illness, unspecified: Secondary | ICD-10-CM | POA: Diagnosis not present

## 2018-03-17 NOTE — Telephone Encounter (Signed)
Ing fax received from [place] to initiate a PA for Gabapentin  PA initiated through covermymeds  Key: [id number]  Your PA has been faxed to the plan as a paper copy. Please contact the plan directly if you haven't received a determination in a typical timeframe 48-72 hours   Awaiting response from insurance company

## 2018-03-21 ENCOUNTER — Telehealth: Payer: Self-pay | Admitting: Internal Medicine

## 2018-03-21 NOTE — Telephone Encounter (Signed)
Spoke with patient. She stated that she has not received her neb solution that was fax over to [employer] (pharmacy for [place]).   Spoke with pharmacy tech at [facility]. She stated that the neb solution had been mailed on 03/11/18. Per their records, the package was sent over to the postal service. Patient will need to contact [employer] for further recs.   Gave patient the number to [employer]. She verbalized understanding. Nothing else needed at time of call.

## 2018-03-22 DIAGNOSIS — J449 Chronic obstructive pulmonary disease, unspecified: Secondary | ICD-10-CM | POA: Diagnosis not present

## 2018-03-22 NOTE — Telephone Encounter (Signed)
Received fax from [facility] [phone] and Gabapentin was APPROVED 11/30/17-11/29/18 Member ID: [id number]

## 2018-03-24 ENCOUNTER — Other Ambulatory Visit: Payer: Medicare HMO

## 2018-03-24 ENCOUNTER — Other Ambulatory Visit: Payer: Self-pay | Admitting: Pulmonary Disease

## 2018-03-24 ENCOUNTER — Other Ambulatory Visit: Payer: Self-pay | Admitting: Nurse Practitioner

## 2018-03-24 DIAGNOSIS — E1121 Type 2 diabetes mellitus with diabetic nephropathy: Secondary | ICD-10-CM

## 2018-03-24 DIAGNOSIS — Z794 Long term (current) use of insulin: Secondary | ICD-10-CM | POA: Diagnosis not present

## 2018-03-24 DIAGNOSIS — J449 Chronic obstructive pulmonary disease, unspecified: Secondary | ICD-10-CM

## 2018-03-24 LAB — COMPLETE METABOLIC PANEL WITH GFR
AG Ratio: 1.2 (calc) (ref 1.0–2.5)
ALT: 8 U/L (ref 6–29)
AST: 14 U/L (ref 10–35)
Albumin: 3.8 g/dL (ref 3.6–5.1)
Alkaline phosphatase (APISO): 89 U/L (ref 33–130)
BUN/Creatinine Ratio: 11 (calc) (ref 6–22)
BUN: 24 mg/dL (ref 7–25)
CO2: 25 mmol/L (ref 20–32)
Calcium: 9.3 mg/dL (ref 8.6–10.4)
Chloride: 106 mmol/L (ref 98–110)
Creat: 2.25 mg/dL — ABNORMAL HIGH (ref 0.60–0.93)
GFR, Est African American: 23 mL/min/{1.73_m2} — ABNORMAL LOW (ref >=60)
GFR, Est Non African American: 20 mL/min/{1.73_m2} — ABNORMAL LOW (ref >=60)
Globulin: 3.1 g/dL (calc) (ref 1.9–3.7)
Glucose, Bld: 123 mg/dL — ABNORMAL HIGH (ref 65–99)
Potassium: 3.7 mmol/L (ref 3.5–5.3)
Sodium: 143 mmol/L (ref 135–146)
Total Bilirubin: 0.3 mg/dL (ref 0.2–1.2)
Total Protein: 6.9 g/dL (ref 6.1–8.1)

## 2018-03-25 ENCOUNTER — Other Ambulatory Visit: Payer: Self-pay

## 2018-03-25 DIAGNOSIS — G8929 Other chronic pain: Secondary | ICD-10-CM

## 2018-03-25 DIAGNOSIS — M545 Low back pain, unspecified: Secondary | ICD-10-CM

## 2018-03-25 LAB — HEMOGLOBIN A1C
Hgb A1c MFr Bld: 6.6 % of total Hgb — ABNORMAL HIGH (ref <5.7)
Mean Plasma Glucose: 143 (calc)
eAG (mmol/L): 7.9 (calc)

## 2018-03-25 MED ORDER — HYDROCODONE-ACETAMINOPHEN 10-325 MG PO TABS
1.0000 | ORAL_TABLET | Freq: Four times a day (QID) | ORAL | 0 refills | Status: DC | PRN
Start: 2018-03-25 — End: 2018-04-27

## 2018-03-25 NOTE — Telephone Encounter (Signed)
A medication refill was received from pharmacy for hydrocodone 10-325 mg. Rx was pended to provider for approval  after verifying last fill date, provider, and quantity on PMP [place]

## 2018-03-28 ENCOUNTER — Telehealth: Payer: Self-pay

## 2018-03-28 ENCOUNTER — Encounter: Payer: Self-pay | Admitting: Pharmacotherapy

## 2018-03-28 ENCOUNTER — Ambulatory Visit: Payer: Medicare HMO | Admitting: Pharmacotherapy

## 2018-03-28 ENCOUNTER — Ambulatory Visit (INDEPENDENT_AMBULATORY_CARE_PROVIDER_SITE_OTHER): Payer: Medicare HMO | Admitting: Pharmacotherapy

## 2018-03-28 VITALS — BP 118/60 | HR 72 | Ht 62.0 in | Wt 204.0 lb

## 2018-03-28 DIAGNOSIS — E1121 Type 2 diabetes mellitus with diabetic nephropathy: Secondary | ICD-10-CM

## 2018-03-28 DIAGNOSIS — I10 Essential (primary) hypertension: Secondary | ICD-10-CM | POA: Diagnosis not present

## 2018-03-28 DIAGNOSIS — Z794 Long term (current) use of insulin: Secondary | ICD-10-CM

## 2018-03-28 MED ORDER — INSULIN ASPART PROT & ASPART (70-30 MIX) 100 UNIT/ML ~~LOC~~ SUSP: SUBCUTANEOUS | 11 refills | Status: DC

## 2018-03-28 NOTE — Telephone Encounter (Signed)
Patient was in office to see Tye [place] today and asked if she could have a medication for her nerves. Patient states she is feeling more nervous and not sleeping well (trouble falling asleep).  Patient states she was on a medication before for her nerves   Please advise   Pending appointment 04/12/18

## 2018-03-28 NOTE — Telephone Encounter (Signed)
She is currently on Cymbalta to help her anxiety. Is she not currently taking?

## 2018-03-28 NOTE — Progress Notes (Signed)
  Subjective:    Michelle Buck is a 82 y.o.African American female who presents for follow-up of Type 2 diabetes mellitus. She reports a difficult time at home as a loved one has recently passed away two months ago. She now lives alone and has increased fear of hypoglycemia.   Current diabetic medications include Novolog 70/30 40 units in the morning and 25 units in the evening  Vision: no acute changes Feet: reports tingling/burning, can't put any socks/shoes on them, right foot is hurting with pressure - being seen for this Diet: harder to make good food choices in the mourning period  Exercise: not able to exercise a lot  Nocturia: 1x/night  Hydration: needs to drink more water  Dysuria: reports burning when urination, incontinence, no new changes  Did not bring in meter for review.  Current monitoring regimen: home blood tests - 1 times daily Any episodes of hypoglycemia? yes - patient states that when she takes her nighttime dose of Novolog 70/30 she always runs low around 3-4 am. She reports noncompliance because of this.  Corrects her hypoglycemia with jelly on bread  Review of Systems Pertinent items are noted in HPI.    Objective:    BP 118/60   Pulse 72   Ht 5' 2"$  (1.575 m)   Wt 204 lb (92.5 kg)   SpO2 97%   BMI 37.31 kg/m   General:  alert, cooperative and no distress                          Extremities: extremities normal, atraumatic, no cyanosis or edema  Skin: warm and dry, no hyperpigmentation, vitiligo, or suspicious lesions     Neuro: normal without focal findings, mental status, speech normal, alert and oriented x3 and PERLA   Lab Review Glucose, Bld (mg/dL)  Date Value  03/24/2018 123 (H)  12/27/2017 100  09/28/2017 67   CO2 (mmol/L)  Date Value  03/24/2018 25  12/27/2017 28  09/28/2017 26   BUN (mg/dL)  Date Value  03/24/2018 24  12/27/2017 27 (H)  09/28/2017 17  02/26/2016 27  01/07/2016 21  10/15/2015 27   Creat (mg/dL)  Date  Value  03/24/2018 2.25 (H)  12/27/2017 2.37 (H)  09/28/2017 2.09 (H)   Hemoglobin A1c: 6.6%  Assessment:    Diabetes Mellitus type II, under good control evidenced by an A1c of 6.6%. Due to frequent nighttime hypoglycemia, now living alone, and comorbidities, it is appropriate to lower her nighttime insulin.  BP at goal <130/80   Plan:    1.  Rx changes: Continue Novolog 70/30 40 units in the morning, but decrease nighttime Novolog 70/30 to 10 units in the evening.  2.  Education: Reviewed 'ABCs' of diabetes management (respective goals in parentheses):  A1C (<8), blood pressure (<130/80), and cholesterol (LDL <100).  3. Blood pressure is within goal of < 130/80 mmHg today. Continue correct prescriptions as prescribed. 4.  Compliance at present is estimated to be fair. Efforts to improve compliance (if necessary) will be directed at dietary modifications: decrease carbohydrate intake, drink more water and regular blood sugar monitoring: 1-2 times daily.  4. Follow up: 3 months

## 2018-03-28 NOTE — Patient Instructions (Signed)
Decrease insulin to 40 units with breakfast, 10 units with supper  Please be sure to have a follow up with Dr Mariea Clonts in the next 3 months

## 2018-03-30 ENCOUNTER — Other Ambulatory Visit: Payer: Self-pay | Admitting: Nurse Practitioner

## 2018-03-30 MED ORDER — DULOXETINE HCL 60 MG PO CPEP: 60.0000 mg | ORAL_CAPSULE | Freq: Every day | ORAL | Status: DC

## 2018-03-30 NOTE — Telephone Encounter (Signed)
Patient is taking Cymbalta but states it is not helping. Stated that it is not strong enough and she wants something stronger. Stated she cannot get any rest and cant stand for the sheets to touch her feet. Please Advise.

## 2018-03-30 NOTE — Telephone Encounter (Signed)
Lets have her increase Cymbalta (which is for anxiety and can help with nerve pain) to 60 mg by mouth daily at this time. We can further address this at her upcoming OV

## 2018-03-30 NOTE — Telephone Encounter (Signed)
Patient notified and agreed. Confirmed her appointment with her.

## 2018-04-02 DIAGNOSIS — E1142 Type 2 diabetes mellitus with diabetic polyneuropathy: Secondary | ICD-10-CM | POA: Diagnosis not present

## 2018-04-02 DIAGNOSIS — R69 Illness, unspecified: Secondary | ICD-10-CM | POA: Diagnosis not present

## 2018-04-02 DIAGNOSIS — E1151 Type 2 diabetes mellitus with diabetic peripheral angiopathy without gangrene: Secondary | ICD-10-CM | POA: Diagnosis not present

## 2018-04-02 DIAGNOSIS — Z794 Long term (current) use of insulin: Secondary | ICD-10-CM | POA: Diagnosis not present

## 2018-04-02 DIAGNOSIS — I110 Hypertensive heart disease with heart failure: Secondary | ICD-10-CM | POA: Diagnosis not present

## 2018-04-02 DIAGNOSIS — J449 Chronic obstructive pulmonary disease, unspecified: Secondary | ICD-10-CM | POA: Diagnosis not present

## 2018-04-02 DIAGNOSIS — I509 Heart failure, unspecified: Secondary | ICD-10-CM | POA: Diagnosis not present

## 2018-04-02 DIAGNOSIS — E785 Hyperlipidemia, unspecified: Secondary | ICD-10-CM | POA: Diagnosis not present

## 2018-04-06 DIAGNOSIS — G4733 Obstructive sleep apnea (adult) (pediatric): Secondary | ICD-10-CM | POA: Diagnosis not present

## 2018-04-08 ENCOUNTER — Other Ambulatory Visit: Payer: Medicare HMO

## 2018-04-08 ENCOUNTER — Other Ambulatory Visit: Payer: Self-pay

## 2018-04-12 ENCOUNTER — Ambulatory Visit: Payer: Medicare HMO | Admitting: Nurse Practitioner

## 2018-04-12 ENCOUNTER — Encounter: Payer: Self-pay | Admitting: Nurse Practitioner

## 2018-04-12 ENCOUNTER — Ambulatory Visit (INDEPENDENT_AMBULATORY_CARE_PROVIDER_SITE_OTHER): Payer: Medicare HMO | Admitting: Nurse Practitioner

## 2018-04-12 VITALS — BP 120/64 | HR 63 | Temp 98.1°F | Ht 62.0 in | Wt 204.6 lb

## 2018-04-12 DIAGNOSIS — F419 Anxiety disorder, unspecified: Secondary | ICD-10-CM

## 2018-04-12 DIAGNOSIS — R69 Illness, unspecified: Secondary | ICD-10-CM | POA: Diagnosis not present

## 2018-04-12 DIAGNOSIS — I10 Essential (primary) hypertension: Secondary | ICD-10-CM | POA: Diagnosis not present

## 2018-04-12 DIAGNOSIS — Z794 Long term (current) use of insulin: Secondary | ICD-10-CM

## 2018-04-12 DIAGNOSIS — T402X5A Adverse effect of other opioids, initial encounter: Secondary | ICD-10-CM | POA: Diagnosis not present

## 2018-04-12 DIAGNOSIS — K5903 Drug induced constipation: Secondary | ICD-10-CM | POA: Diagnosis not present

## 2018-04-12 DIAGNOSIS — J449 Chronic obstructive pulmonary disease, unspecified: Secondary | ICD-10-CM | POA: Diagnosis not present

## 2018-04-12 DIAGNOSIS — E785 Hyperlipidemia, unspecified: Secondary | ICD-10-CM

## 2018-04-12 DIAGNOSIS — Z7189 Other specified counseling: Secondary | ICD-10-CM | POA: Diagnosis not present

## 2018-04-12 DIAGNOSIS — F329 Major depressive disorder, single episode, unspecified: Secondary | ICD-10-CM

## 2018-04-12 DIAGNOSIS — E1121 Type 2 diabetes mellitus with diabetic nephropathy: Secondary | ICD-10-CM

## 2018-04-12 DIAGNOSIS — F32A Depression, unspecified: Secondary | ICD-10-CM

## 2018-04-12 MED ORDER — NALOXEGOL OXALATE 12.5 MG PO TABS: 12.5000 mg | ORAL_TABLET | Freq: Every day | ORAL | 2 refills | Status: DC

## 2018-04-12 NOTE — Progress Notes (Signed)
Careteam: Patient Care Team: [name], NP as PCP - General (Nurse Practitioner) [name], MD as Consulting Physician (Ophthalmology) [name], MD as Consulting Physician (Nephrology)  Advanced Directive information Does Patient Have a Medical Advance Directive?: No  Allergies  Allergen Reactions  . Tramadol Other (See Comments)    Leg cramps   . Codeine Nausea And Vomiting    Patient states N/V with codeine  . Hydrocodone Nausea And Vomiting  . Penicillins Rash    Patient states rash/itch with penicillin Has patient had a PCN reaction causing immediate rash, facial/tongue/throat swelling, SOB or lightheadedness with hypotension: Yes- broke me out and i was itching Has patient had a PCN reaction causing severe rash involving mucus membranes or skin necrosis: No Has patient had a PCN reaction that required hospitalization Yes- i was already in the hospital Has patient had a PCN reaction occurring within the last 10 years: No If all of the above answers are "NO  . Tylenol [Acetaminophen] Hives    Chief Complaint  Patient presents with  . Medical Management of Chronic Issues    Pt is being seen for a 3 month routine visit.      HPI: Patient is a 82 y.o. female seen in the office today for routine follow up.   DM- previously following with Tivis Ringer, taking novolog 70/30 40 units in the morning and 25 units in the evening. Reported non complaince due to running low around 3-4 am. Unable to follow dietary modifications or exercise. Pt with neuropathy of bilateral LE. Denies visual changes. Due to the hypoglycemia in the evening Novolog 70/30 was reduced to 10 units. She was maintained on 40 in the morning. Last A1c 6.6 2 weeks ago. No longer having the drop in blood sugar. Taking as prescribed. Going to ophthalmologist next month  COPD- following with pulmonary- maintained on budesonide and duoneb. She has been instructed to use CPAP at hs and to  wear at least 4 hours at night. Follow up scheduled in July.   Chronic pain/Chronic midline low back pain with sciatica- multilevel stenosis- not a candidate for procedure due to multiple chronic issues such as heart faily, CKD, COPD, etc. Has seen orthopedic who recommended cutting back on pain medication, reporting that it is probably not helping significantly. Reports increase in pain. Needs something to help with the pain. Taking hydrocodone frequently- every 3 hours for pain. (prescribed for every 6 hours) Cymbalta also was increased but has not taken increased dose.   HTN- stable on metoprolol and hydralazine.   CKD- following with nephrology, not on ACE/ARB due to kidney function   CHF- previously following with cardiology,  Last OV noted in 09/2016. Currently on hydralazine, metoprolol, and lasix.   Reports chest pains that comes and goes that has been going on for a while. When she uses her inhaler this relieves symptoms also will "rub" area of chest that helps.   Hyperlipidemia- LDL at 75 in December, continues on simvastatin   Anxiety/depression- stable, continues on Cymbalta which was recently increased for worsening "nerves"    Constipation- using amitiza 24 mcg, having a lot of issues with consitpation, taking more amitiza than 1 tablet twice daily to have a bowel movement.   Having trouble hearing- going to audiologist today- she states she is going to walk in and have them check.   Advanced care planning- reports her sister [name] has documents for her. Reports she pays her bills for her. Unsure of what  documents she has   Review of Systems:  Review of Systems  Constitutional: Negative for chills, fever and weight loss.  HENT: Positive for hearing loss.   Respiratory: Negative for cough, sputum production and shortness of breath.   Cardiovascular: Positive for leg swelling. Negative for chest pain and palpitations.  Gastrointestinal: Negative for abdominal pain,  constipation, diarrhea and heartburn.  Genitourinary: Negative for dysuria, frequency and urgency.  Musculoskeletal: Positive for back pain, joint pain and myalgias. Negative for falls.       Chronic back and shoulder pain Reports pain to chest wall that she will rub when she has increase in pain  Skin: Negative.   Neurological: Positive for tingling, sensory change and weakness. Negative for dizziness and headaches.  Psychiatric/Behavioral: Positive for depression. Negative for memory loss. The patient is nervous/anxious. The patient does not have insomnia.        Reports depression (after loss of loved one) has improved    Past Medical History:  Diagnosis Date  . Acute bronchitis   . Anxiety   . Arthritis   . Chronic kidney disease, stage II (mild)   . Complications affecting other specified body systems, hypertension   . Congestive heart failure, unspecified    stress test in March at [facility]  . COPD (chronic obstructive pulmonary disease) ([place])    present for several years, diagnosed in the last few years  . Degenerative arthritis of knee, bilateral   . Diabetes mellitus   . Diaphragmatic hernia without mention of obstruction or gangrene   . Disorder of bone and cartilage, unspecified   . GERD (gastroesophageal reflux disease)   . Hiatal hernia   . Hyperlipidemia   . Hypertension   . Hypopotassemia   . Insomnia, unspecified   . Lumbago   . Obese   . Osteoarthrosis, unspecified whether generalized or localized, unspecified site   . PONV (postoperative nausea and vomiting)   . Scoliosis   . Secondary diabetes mellitus with renal manifestations, not stated as uncontrolled, or unspecified(249.40)   . Unspecified glaucoma(365.9)   . Unspecified hereditary and idiopathic peripheral neuropathy    Past Surgical History:  Procedure Laterality Date  . ABDOMINAL HYSTERECTOMY    . COLONOSCOPY  08/06/2011  . FOOT SURGERY    . KNEE ARTHROSCOPY     bilateral  . SHOULDER  SURGERY    . TOTAL HIP ARTHROPLASTY     bilateral   Social History:   reports that she quit smoking about 21 years ago. Her smoking use included cigarettes. She has a 1.50 pack-year smoking history. She has never used smokeless tobacco. She reports that she does not drink alcohol or use drugs.  Family History  Problem Relation Age of Onset  . Diabetes Mother   . Heart disease Mother   . Heart disease Brother   . Diabetes Brother   . Kidney disease Father   . Scoliosis Sister   . Diabetes Brother   . Diabetes Brother   . Heart disease Brother   . Scoliosis Brother   . Stroke Brother   . Heart attack Brother     Medications: Patient's Medications  New Prescriptions   No medications on file  Previous Medications   ALBUTEROL (PROVENTIL HFA;VENTOLIN HFA) 108 (90 BASE) MCG/ACT INHALER    Inhale 2 puffs into the lungs every 4 (four) hours as needed for wheezing or shortness of breath.   AMITIZA 24 MCG CAPSULE    TAKE 1 CAPSULE BY MOUTH 2 TIMES A  DAY WITH MEALS   ASPIRIN EC 81 MG TABLET    Take 81 mg by mouth daily.   BUDESONIDE (PULMICORT) 0.25 MG/2ML NEBULIZER SOLUTION    Take one ampule in Neb twice daily routinely for COPD  J44.1   CHOLECALCIFEROL (VITAMIN D3) 2000 UNITS TABS    Take 2,000 Units by mouth daily.   CILOSTAZOL (PLETAL) 100 MG TABLET    TAKE 1 TABLET BY MOUTH ONCE DAILY   DICLOFENAC SODIUM (VOLTAREN) 1 % GEL    APPLY 4 GRAMS TOPICALLY TWICE DAILY AS NEEDED FOR PAIN   DULOXETINE (CYMBALTA) 60 MG CAPSULE    Take 1 capsule (60 mg total) by mouth daily.   EASY TOUCH INSULIN SYRINGE 31G X 5/16" 0.5 ML MISC    USE UP TO 2 TIMES A DAY AS DIRECTED.   ESOMEPRAZOLE (NEXIUM) 40 MG CAPSULE    TAKE 1 CAPSULE BY MOUTH ONCE DAILY   FUROSEMIDE (LASIX) 40 MG TABLET    TAKE 1 TABLET BY MOUTH TWICE A DAY   GABAPENTIN (NEURONTIN) 100 MG CAPSULE    TAKE 2 CAPSULES BY MOUTH THREE TIMES A DAY   GUAIFENESIN (ROBITUSSIN) 100 MG/5ML SYRUP    Take 200 mg by mouth 3 (three) times daily as needed  for cough.   HYDRALAZINE (APRESOLINE) 50 MG TABLET    TAKE 1 TABLET BY MOUTH THREE TIMES A DAY   HYDROCODONE-ACETAMINOPHEN (NORCO) 10-325 MG TABLET    Take 1 tablet by mouth every 6 (six) hours as needed.   INSULIN ASPART PROTAMINE- ASPART (NOVOLOG MIX 70/30) (70-30) 100 UNIT/ML INJECTION    Inject 40 units subcutaneously with breakfast and inject 10 units subcutaneously with dinner to control blood sugar   IPRATROPIUM-ALBUTEROL (DUONEB) 0.5-2.5 (3) MG/3ML SOLN    Take 3 mLs by nebulization 2 (two) times daily. Dx: J44.1   METOPROLOL SUCCINATE (TOPROL-XL) 25 MG 24 HR TABLET    TAKE 1 TABLET BY MOUTH ONCE DAILY WITH OR IMMEDIATELY FOLLOWING A MEAL FOR BLOOD PRESSURE   ONDANSETRON (ZOFRAN ODT) 8 MG DISINTEGRATING TABLET    Take 1 tablet (8 mg total) by mouth every 8 (eight) hours as needed for nausea or vomiting.   POTASSIUM CHLORIDE SA (K-DUR,KLOR-CON) 20 MEQ TABLET    TAKE 1 TABLET BY MOUTH ONCE DAILY   SIMVASTATIN (ZOCOR) 20 MG TABLET    TAKE 1 TABLET BY MOUTH ONCE DAILY   SUCRALFATE (CARAFATE) 1 G TABLET    TAKE 1 TABLET BY MOUTH TWICE DAILY ON EMPTY STOMACH   TAMSULOSIN (FLOMAX) 0.4 MG CAPS CAPSULE    Take 1 capsule (0.4 mg total) by mouth daily.   TIMOLOL (TIMOPTIC) 0.5 % OPHTHALMIC SOLUTION    INSTILL 1 DROP INTO EACH EYE TWICE A DAY   TRAVATAN Z 0.004 % SOLN OPHTHALMIC SOLUTION    Place 1 drop into both eyes at bedtime.   Modified Medications   No medications on file  Discontinued Medications   GABAPENTIN (NEURONTIN) 100 MG CAPSULE    TAKE 2 CAPSULES BY MOUTH THREE TIMES A DAY     Physical Exam:  Vitals:   04/12/18 1352  BP: 120/64  Pulse: 63  Temp: 98.1 F (36.7 C)  TempSrc: Oral  SpO2: 96%  Weight: 204 lb 9.6 oz (92.8 kg)  Height: 5\' 2"  (1.575 m)   Body mass index is 37.42 kg/m.  Physical Exam  Constitutional: She is oriented to person, place, and time. She appears well-developed and well-nourished. No distress.  HENT:  Head: Normocephalic and atraumatic.  Mouth/Throat: Oropharynx is clear and moist. No oropharyngeal exudate.  Eyes: Pupils are equal, round, and reactive to light. Conjunctivae are normal.  Neck: Normal range of motion. Neck supple.  Cardiovascular: Normal rate, regular rhythm and normal heart sounds.  Pulmonary/Chest: Effort normal and breath sounds normal.  Abdominal: Soft. Bowel sounds are normal.  Musculoskeletal: She exhibits edema (1+ bilaterally) and tenderness (lower back, legs, chest, shoulders).  In wheelchair today  Neurological: She is alert and oriented to person, place, and time.  Skin: Skin is warm and dry. She is not diaphoretic.  Psychiatric: She has a normal mood and affect.    Labs reviewed: Basic Metabolic Panel: Recent Labs    09/28/17 0811 12/27/17 1045 03/24/18 0806  NA 142 145 143  K 3.7 3.4* 3.7  CL 104 106 106  CO2 26 28 25   GLUCOSE 67 100 123*  BUN 17 27* 24  CREATININE 2.09* 2.37* 2.25*  CALCIUM 8.9 9.0 9.3   Liver Function Tests: Recent Labs    05/06/17 0831 05/07/17 0342 06/21/17 1111 09/28/17 0811 12/27/17 1045 03/24/18 0806  AST 18 14* 14 16 15 14   ALT 12* 10* 8 8 8 8   ALKPHOS 93 78 90  --   --   --   BILITOT 0.6 0.6 0.3 0.2 0.2 0.3  PROT 7.0 6.5 6.8 6.8 6.7 6.9  ALBUMIN 3.4* 3.1* 3.7  --   --   --    Recent Labs    05/03/17 1204 05/06/17 0831  LIPASE 19 17   Recent Labs    05/06/17 0831  AMMONIA 29   CBC: Recent Labs    05/06/17 0831 05/07/17 0342 05/17/17 1448  WBC 7.8 8.1 7.9  NEUTROABS 6.1  --  4,345  HGB 10.0* 9.1* 10.6*  HCT 33.0* 29.7* 33.5*  MCV 82.5 82.0 79.8*  PLT 271 261 267   Lipid Panel: Recent Labs    11/24/17 0923  CHOL 164  HDL 64  LDLCALC 75  TRIG 146  CHOLHDL 2.6   TSH: No results for input(s): TSH in the last 8760 hours. A1C: Lab Results  Component Value Date   HGBA1C 6.6 (H) 03/24/2018     Assessment/Plan 1. Type 2 diabetes mellitus with diabetic nephropathy, with long-term current use of insulin  (HCC) -controlled on current regimen, no hypoglycemia noted with reduced HS dosing. Will continue current regimen - Hemoglobin A1c; Future - CBC with Differential/Platelets; Future  2. Essential hypertension, benign Stable. Will continue current regimen  3. Chronic obstructive pulmonary disease, unspecified COPD type ([place]) Stable on pulmicort and duoneb, with home O2 as needed  4. Anxiety and depression -plan to increase cymbalta to 60 mg by mouth daily to help "nerves" and pain. Pt reports depression has improved. Seeming to cope with the loss of her loved one.   5. Advanced care planning/counseling discussion -discussed obtaining paperwork for healthcare POA and living will. Her brother seems to think their sister has [place] POA and financial POA but they will confirm and bring in paperwork to office.  - DNR (Do Not Resuscitate)  6. Hyperlipidemia LDL goal <70 -continues on simvastatin  - Lipid Panel; Future - COMPLETE METABOLIC PANEL WITH GFR; Future  7.  Constipation due to opioid therapy -to stop amitiza due to not beginning effective.  - naloxegol oxalate (MOVANTIK) 12.5 MG TABS tablet; Take 1 tablet (12.5 mg total) by mouth daily.  Dispense: 30 tablet; Refill: 2  Next appt: 07/13/2018, sooner if needed  Janett Billow K. Harle Battiest  [facility] [phone]

## 2018-04-12 NOTE — Patient Instructions (Addendum)
STOP amitiza  START movantik 12.5 mg by mouth daily--- TO CALL if this is effective and we will send in prescription to pharmacy   Talk to your sister about healthcare power of attorney/ living will-- bring to office so we can have paperwork on file.  Start Cymbalta 60 mg by mouth daily- can take 2 of the 30 mg tablet until done then start 60 mg to help with neuropathy/chronic pain.    To make appt with Dr Oval Linsey, Cardiologist, for follow up.  [place] [facility] in [place], [place] Address: [address] [place], [place], [place] [address] Phone: [phone]   Follow up in 3 months (Dr Eulas Post) with Lab work before visit.

## 2018-04-14 DIAGNOSIS — J449 Chronic obstructive pulmonary disease, unspecified: Secondary | ICD-10-CM | POA: Diagnosis not present

## 2018-04-19 ENCOUNTER — Other Ambulatory Visit: Payer: Self-pay | Admitting: Pulmonary Disease

## 2018-04-19 DIAGNOSIS — J449 Chronic obstructive pulmonary disease, unspecified: Secondary | ICD-10-CM

## 2018-04-21 ENCOUNTER — Encounter: Payer: Self-pay | Admitting: Cardiovascular Disease

## 2018-04-21 DIAGNOSIS — J449 Chronic obstructive pulmonary disease, unspecified: Secondary | ICD-10-CM | POA: Diagnosis not present

## 2018-04-27 ENCOUNTER — Other Ambulatory Visit: Payer: Self-pay | Admitting: *Deleted

## 2018-04-27 DIAGNOSIS — M545 Low back pain, unspecified: Secondary | ICD-10-CM

## 2018-04-27 DIAGNOSIS — G8929 Other chronic pain: Secondary | ICD-10-CM

## 2018-04-27 MED ORDER — HYDROCODONE-ACETAMINOPHEN 10-325 MG PO TABS: 1.0000 | ORAL_TABLET | Freq: Four times a day (QID) | ORAL | 0 refills | Status: DC | PRN

## 2018-04-27 NOTE — Telephone Encounter (Signed)
Patient requested [place] Verified LR: 03/25/2018 Pended Rx and sent to [facility] for approval.

## 2018-05-03 DIAGNOSIS — J449 Chronic obstructive pulmonary disease, unspecified: Secondary | ICD-10-CM | POA: Diagnosis not present

## 2018-05-05 ENCOUNTER — Ambulatory Visit: Payer: Medicare HMO | Admitting: Internal Medicine

## 2018-05-05 ENCOUNTER — Encounter: Payer: Self-pay | Admitting: Internal Medicine

## 2018-05-05 ENCOUNTER — Ambulatory Visit (INDEPENDENT_AMBULATORY_CARE_PROVIDER_SITE_OTHER): Payer: Medicare HMO | Admitting: Internal Medicine

## 2018-05-05 VITALS — BP 128/74 | HR 63 | Ht 62.0 in | Wt 220.0 lb

## 2018-05-05 DIAGNOSIS — J449 Chronic obstructive pulmonary disease, unspecified: Secondary | ICD-10-CM

## 2018-05-05 DIAGNOSIS — R1319 Other dysphagia: Secondary | ICD-10-CM

## 2018-05-05 DIAGNOSIS — R1310 Dysphagia, unspecified: Secondary | ICD-10-CM | POA: Diagnosis not present

## 2018-05-05 NOTE — Progress Notes (Signed)
@Patient  ID: Michelle Buck, female    DOB: 1936-10-16,     MRN: [id number]                Referring provider: [name], NP  HPI: 82 year old female former smoker followed for COPD and obstructive sleep apnea  Significant tests/ events  Spirometry 08/2016 shows moderate restriction with a ratio of 80, FVC 59%, FEV1 62% Echo 02/2012 shows grade 2 diastolic dysfunction  NPSG 08/2016 severe AHI 66/h, CPAP 16 cm  02/02/2018 NP Follow up : COPD and OSA  Patient presents for a six-month follow-up.  Patient has underlying COPD . She is on Pulmicort and DuoNeb nebulizer twice daily.  Says that her shortness of breath is a same.  She gets winded with activities at times.  She denies any flare of her cough.  She denies any fever chest pain orthopnea or edema. Flu shot is up-to-date  Has Severe OSA but says not been wearing her CPAP .  Says she can not tolerate the mask she likes.  We looked at various mask and she would like to try a full face mask dream wear  She lives by herself. Her son was just shot and killed. Support provided.  rec Continue on Budesonide and Duoneb Twice daily  .  Please restart CPAP At bedtime   Try to wear for at least 4hrs each night .  Follow up with Dr. Elsworth Soho  In 3 months and As needed       05/05/2018  f/u ov/Romney Compean re: obesity complicated by OSA / restrictive pfts with min airflow obst s/p smoking cessation in 1998  Chief Complaint  Patient presents with  . Follow-up    Breathing is overall doing well.  She states she [place] using her albuterol inhaler.  main issue is dysphagia gradually worse x sev weeks, has seen Ardis Hughs for EGD last in 2012 Supposed to be on maint rx with pulmocort/duoneb but also using saba ? Why?    No obvious day to day or daytime variability or assoc excess/ purulent sputum or mucus plugs or hemoptysis or cp or chest tightness, subjective wheeze or overt sinus or hb symptoms. No unusual exposure hx or h/o childhood pna/ asthma  or knowledge of premature birth.  Sleeping ok on cpap  without nocturnal  or early am exacerbation  of respiratory  c/o's or need for noct saba. Also denies any obvious fluctuation of symptoms with weather or environmental changes or other aggravating or alleviating factors except as outlined above   Current Allergies, Complete Past Medical History, Past Surgical History, Family History, and Social History were reviewed in [employer] record.  ROS  The following are not active complaints unless bolded Hoarseness, sore throat, dysphagia, dental problems, itching, sneezing,  nasal congestion or discharge of excess mucus or purulent secretions, ear ache,   fever, chills, sweats, unintended wt loss or wt gain, classically pleuritic or exertional cp,  orthopnea pnd or arm/hand swelling  or leg swelling, presyncope, palpitations, abdominal pain, anorexia, nausea, vomiting, diarrhea  or change in bowel habits or change in bladder habits, change in stools or change in urine, dysuria, hematuria,  rash, arthralgias, visual complaints, headache, numbness, weakness or ataxia or problems with walking or coordination,  change in mood or  memory.        Current Meds  Medication Sig  . albuterol (PROVENTIL HFA;VENTOLIN HFA) 108 (90 Base) MCG/ACT inhaler Inhale 2 puffs into the lungs every 4 (four) hours  as needed for wheezing or shortness of breath.  Marland Kitchen aspirin EC 81 MG tablet Take 81 mg by mouth daily.  . budesonide (PULMICORT) 0.25 MG/2ML nebulizer solution Take one ampule in Neb twice daily routinely for COPD  J44.1  . Cholecalciferol (VITAMIN D3) 2000 units TABS Take 2,000 Units by mouth daily.  . cilostazol (PLETAL) 100 MG tablet TAKE 1 TABLET BY MOUTH ONCE DAILY  . diclofenac sodium (VOLTAREN) 1 % GEL APPLY 4 GRAMS TOPICALLY TWICE DAILY AS NEEDED FOR PAIN  . DULoxetine (CYMBALTA) 60 MG capsule Take 1 capsule (60 mg total) by mouth daily.  Marland Kitchen EASY TOUCH INSULIN SYRINGE 31G X 5/16" 0.5 ML  MISC USE UP TO 2 TIMES A DAY AS DIRECTED.  Marland Kitchen esomeprazole (NEXIUM) 40 MG capsule TAKE 1 CAPSULE BY MOUTH ONCE DAILY  . furosemide (LASIX) 40 MG tablet TAKE 1 TABLET BY MOUTH TWICE A DAY  . gabapentin (NEURONTIN) 100 MG capsule TAKE 2 CAPSULES BY MOUTH THREE TIMES A DAY  . guaifenesin (ROBITUSSIN) 100 MG/5ML syrup Take 200 mg by mouth 3 (three) times daily as needed for cough.  . hydrALAZINE (APRESOLINE) 50 MG tablet TAKE 1 TABLET BY MOUTH THREE TIMES A DAY  . HYDROcodone-acetaminophen (NORCO) 10-325 MG tablet Take 1 tablet by mouth every 6 (six) hours as needed.  . insulin aspart protamine- aspart (NOVOLOG MIX 70/30) (70-30) 100 UNIT/ML injection Inject 40 units subcutaneously with breakfast and inject 10 units subcutaneously with dinner to control blood sugar  . ipratropium-albuterol (DUONEB) 0.5-2.5 (3) MG/3ML SOLN Take 3 mLs by nebulization 2 (two) times daily. Dx: J44.1  . metoprolol succinate (TOPROL-XL) 25 MG 24 hr tablet TAKE 1 TABLET BY MOUTH ONCE DAILY WITH OR IMMEDIATELY FOLLOWING A MEAL FOR BLOOD PRESSURE  . naloxegol oxalate (MOVANTIK) 12.5 MG TABS tablet Take 1 tablet (12.5 mg total) by mouth daily.  . ondansetron (ZOFRAN ODT) 8 MG disintegrating tablet Take 1 tablet (8 mg total) by mouth every 8 (eight) hours as needed for nausea or vomiting.  . potassium chloride SA (K-DUR,KLOR-CON) 20 MEQ tablet TAKE 1 TABLET BY MOUTH ONCE DAILY  . simvastatin (ZOCOR) 20 MG tablet TAKE 1 TABLET BY MOUTH ONCE DAILY  . sucralfate (CARAFATE) 1 g tablet TAKE 1 TABLET BY MOUTH TWICE DAILY ON EMPTY STOMACH  . tamsulosin (FLOMAX) 0.4 MG CAPS capsule Take 1 capsule (0.4 mg total) by mouth daily.  . timolol (TIMOPTIC) 0.5 % ophthalmic solution INSTILL 1 DROP INTO EACH EYE TWICE A DAY  . TRAVATAN Z 0.004 % SOLN ophthalmic solution Place 1 drop into both eyes at bedtime.          PEx W/c bound elderly bf nad     Wt Readings from Last 3 Encounters:  05/05/18 220 lb (99.8 kg)  04/12/18 204 lb 9.6 oz  (92.8 kg)  03/28/18 204 lb (92.5 kg)     Vital signs reviewed - Note on arrival 02 sats  92% RA    No jvd Oropharynx clear Neck supple Lungs clear bilaterally to A and P  RRR no s3 or or sign murmur Abd obese soft/ non tender/ limited excursion on isp Ext wam with no edema or clubbing noted Neuro  Alert/ No motor deficits      Assessment & Plan:

## 2018-05-05 NOTE — Patient Instructions (Addendum)
Please see patient coordinator before you leave today  to schedule re-evaluation by Dr Ardis Hughs in [facility] for your swallowing problems    Please schedule a follow up visit in 3 months but call sooner if needed with Dr Elsworth Soho

## 2018-05-09 DIAGNOSIS — G4733 Obstructive sleep apnea (adult) (pediatric): Secondary | ICD-10-CM | POA: Diagnosis not present

## 2018-05-10 ENCOUNTER — Encounter: Payer: Self-pay | Admitting: Internal Medicine

## 2018-05-10 NOTE — Assessment & Plan Note (Signed)
Body mass index is 40.24 kg/m.  -  trending up Lab Results  Component Value Date   TSH 2.213 01/01/2016     Contributing to gerd risk/ doe/restrictive changes on pfts/ reviewed the need and the process to achieve and maintain neg calorie balance > defer f/u primary care including intermittently monitoring thyroid status

## 2018-05-10 NOTE — Assessment & Plan Note (Signed)
She has very minimal airflow obst clinically and reflected in her last pfts > no change rx needed but should be using a lot less rescue saba in this setting as most of her doe is not suggestive of an asthmatic component rather just related to obesity/ deconditioning.

## 2018-05-10 NOTE — Assessment & Plan Note (Signed)
EGD 2012 [place] > referred back 05/05/2018 - in meantime continue nexium/ diet

## 2018-05-13 ENCOUNTER — Other Ambulatory Visit: Payer: Self-pay | Admitting: Internal Medicine

## 2018-05-13 DIAGNOSIS — J449 Chronic obstructive pulmonary disease, unspecified: Secondary | ICD-10-CM

## 2018-05-15 DIAGNOSIS — J449 Chronic obstructive pulmonary disease, unspecified: Secondary | ICD-10-CM | POA: Diagnosis not present

## 2018-05-16 DIAGNOSIS — E119 Type 2 diabetes mellitus without complications: Secondary | ICD-10-CM | POA: Diagnosis not present

## 2018-05-18 ENCOUNTER — Ambulatory Visit (INDEPENDENT_AMBULATORY_CARE_PROVIDER_SITE_OTHER): Payer: Medicare HMO | Admitting: Physician Assistant

## 2018-05-18 ENCOUNTER — Encounter: Payer: Self-pay | Admitting: Physician Assistant

## 2018-05-18 VITALS — BP 136/68 | HR 56 | Ht 62.0 in | Wt 205.1 lb

## 2018-05-18 DIAGNOSIS — R1310 Dysphagia, unspecified: Secondary | ICD-10-CM

## 2018-05-18 DIAGNOSIS — K219 Gastro-esophageal reflux disease without esophagitis: Secondary | ICD-10-CM

## 2018-05-18 NOTE — H&P (View-Only) (Signed)
Subjective:    Patient ID: Michelle Buck, female    DOB: 07/06/1936, 82 y.o.   MRN: [id number]  HPI Malaika is a pleasant 82 year old African-American female, referred today by Dr.Wert for evaluation of solid food dysphasia.  She is known to Dr. Ardis Hughs and was last seen in our office in 2012.  She underwent colonoscopy and endoscopy at that time.  EGD showed a 7 to 8 cm hiatal hernia and otherwise negative exam.  Colonoscopy with mild diverticulosis in the left colon, no polyps or other lesions noted. Patient has history of peripheral arterial disease, COPD for which she is on O2 chronically at 2 L, adult onset diabetes mellitus with peripheral neuropathy, chronic kidney disease stage III, morbid obesity, chronic constipation, hypertension and congestive heart failure with EF of 60%. Patient says that very remotely she had had an upper endoscopy with dilation of her esophagus and that helped for a long time, but that symptoms recurred about 2 months ago.  She says she is having trouble with each meal and feels as if her food is getting stuck in her esophagus.  She says sometimes she has to vomit or regurgitate to alleviate symptoms.  Occasionally she can stop eating and eventually the food will go on down though this can take some time.  She says she has not been eating meat recently because she cannot chew it as she needs some new dentures.  She is not generally having any difficulty with liquids.  Appetite has been okay weight has been stable.  She is maintained on Nexium 40 mg once daily for chronic GERD which generally works fairly well. She is on Pletal for peripheral arterial disease. She is currently nonambulatory and only walk with max assist, and is on chronic O2. She  states she would like to have an endoscopy and esophageal dilation.  Review of Systems Pertinent positive and negative review of systems were noted in the above HPI section.  All other review of systems was otherwise  negative.  Outpatient Encounter Medications as of 05/18/2018  Medication Sig  . albuterol (PROVENTIL HFA;VENTOLIN HFA) 108 (90 Base) MCG/ACT inhaler Inhale 2 puffs into the lungs every 4 (four) hours as needed for wheezing or shortness of breath.  Marland Kitchen aspirin EC 81 MG tablet Take 81 mg by mouth daily.  . budesonide (PULMICORT) 0.25 MG/2ML nebulizer solution USE 1 AMPULE IN NEBULIZER TWICE DAILY ROUTINELY FOR COPD  . Cholecalciferol (VITAMIN D3) 2000 units TABS Take 2,000 Units by mouth daily.  . cilostazol (PLETAL) 100 MG tablet TAKE 1 TABLET BY MOUTH ONCE DAILY  . diclofenac sodium (VOLTAREN) 1 % GEL APPLY 4 GRAMS TOPICALLY TWICE DAILY AS NEEDED FOR PAIN  . DULoxetine (CYMBALTA) 60 MG capsule Take 1 capsule (60 mg total) by mouth daily.  Marland Kitchen EASY TOUCH INSULIN SYRINGE 31G X 5/16" 0.5 ML MISC USE UP TO 2 TIMES A DAY AS DIRECTED.  Marland Kitchen esomeprazole (NEXIUM) 40 MG capsule TAKE 1 CAPSULE BY MOUTH ONCE DAILY  . furosemide (LASIX) 40 MG tablet TAKE 1 TABLET BY MOUTH TWICE A DAY  . gabapentin (NEURONTIN) 100 MG capsule TAKE 2 CAPSULES BY MOUTH THREE TIMES A DAY  . guaifenesin (ROBITUSSIN) 100 MG/5ML syrup Take 200 mg by mouth 3 (three) times daily as needed for cough.  . hydrALAZINE (APRESOLINE) 50 MG tablet TAKE 1 TABLET BY MOUTH THREE TIMES A DAY  . HYDROcodone-acetaminophen (NORCO) 10-325 MG tablet Take 1 tablet by mouth every 6 (six) hours as needed.  . insulin  aspart protamine- aspart (NOVOLOG MIX 70/30) (70-30) 100 UNIT/ML injection Inject 40 units subcutaneously with breakfast and inject 10 units subcutaneously with dinner to control blood sugar  . ipratropium-albuterol (DUONEB) 0.5-2.5 (3) MG/3ML SOLN Take 3 mLs by nebulization 2 (two) times daily. Dx: J44.1  . metoprolol succinate (TOPROL-XL) 25 MG 24 hr tablet TAKE 1 TABLET BY MOUTH ONCE DAILY WITH OR IMMEDIATELY FOLLOWING A MEAL FOR BLOOD PRESSURE  . naloxegol oxalate (MOVANTIK) 12.5 MG TABS tablet Take 1 tablet (12.5 mg total) by mouth daily.  .  ondansetron (ZOFRAN ODT) 8 MG disintegrating tablet Take 1 tablet (8 mg total) by mouth every 8 (eight) hours as needed for nausea or vomiting.  . potassium chloride SA (K-DUR,KLOR-CON) 20 MEQ tablet TAKE 1 TABLET BY MOUTH ONCE DAILY  . simvastatin (ZOCOR) 20 MG tablet TAKE 1 TABLET BY MOUTH ONCE DAILY  . sucralfate (CARAFATE) 1 g tablet TAKE 1 TABLET BY MOUTH TWICE DAILY ON EMPTY STOMACH  . tamsulosin (FLOMAX) 0.4 MG CAPS capsule Take 1 capsule (0.4 mg total) by mouth daily.  . timolol (TIMOPTIC) 0.5 % ophthalmic solution INSTILL 1 DROP INTO EACH EYE TWICE A DAY  . TRAVATAN Z 0.004 % SOLN ophthalmic solution Place 1 drop into both eyes at bedtime.   . [DISCONTINUED] budesonide (PULMICORT) 0.25 MG/2ML nebulizer solution Take one ampule in Neb twice daily routinely for COPD  J44.1   No facility-administered encounter medications on file as of 05/18/2018.    Allergies  Allergen Reactions  . Tramadol Other (See Comments)    Leg cramps   . Codeine Nausea And Vomiting    Patient states N/V with codeine  . Hydrocodone Nausea And Vomiting  . Penicillins Rash    Patient states rash/itch with penicillin Has patient had a PCN reaction causing immediate rash, facial/tongue/throat swelling, SOB or lightheadedness with hypotension: Yes- broke me out and i was itching Has patient had a PCN reaction causing severe rash involving mucus membranes or skin necrosis: No Has patient had a PCN reaction that required hospitalization Yes- i was already in the hospital Has patient had a PCN reaction occurring within the last 10 years: No If all of the above answers are "NO  . Tylenol [Acetaminophen] Hives   Patient Active Problem List   Diagnosis Date Noted  . Dysphagia 05/05/2018  . Hypercapnia 05/06/2017  . Osteoarthritis 03/25/2017  . Shoulder pain 03/25/2017  . Abnormality of gait 03/25/2017  . COPD (chronic obstructive pulmonary disease) ([place]) 09/25/2016  . Diastolic CHF ([place]) 81/11/7508  . Chest  pressure 09/25/2016  . Dyspnea and respiratory abnormality 09/23/2016  . OSA (obstructive sleep apnea) 09/02/2016  . Other fatigue 06/23/2016  . PAD (peripheral artery disease) ([place]) 05/21/2016  . Glaucoma 05/21/2016  . Bilateral edema of lower extremity   . Leg edema 01/01/2016  . CKD stage 3 due to type 2 diabetes mellitus ([place])   . CHF (congestive heart failure) ([place]) 10/29/2015  . Chronic obstructive pulmonary disease ([place]) 10/29/2015  . Lumbago   . CKD (chronic kidney disease)   . Chronic kidney disease (CKD), stage IV (severe) ([place]) 09/11/2015  . Diabetes mellitus with diabetic nephropathy, with long-term current use of insulin ([place]) 09/11/2015  . Anemia of chronic kidney failure 09/11/2015  . Morbid obesity ([place]) 08/14/2015  . Peripheral autonomic neuropathy due to diabetes mellitus ([place]) 10/24/2013  . Essential hypertension, benign 02/27/2013  . Acute on chronic diastolic congestive heart failure ([place]) 03/02/2012  . Diabetes mellitus type 2 in obese ([place]) 03/02/2012  Social History   Socioeconomic History  . Marital status: Widowed    Spouse name: Not on file  . Number of children: Not on file  . Years of education: Not on file  . Highest education level: Not on file  Occupational History  . Occupation: Retired    Fish farm manager: UNEMPLOYED  Social Needs  . Financial resource strain: Not very hard  . Food insecurity:    Worry: Never true    Inability: Never true  . Transportation needs:    Medical: No    Non-medical: No  Tobacco Use  . Smoking status: Former Smoker    Packs/day: 0.50    Years: 3.00    Pack years: 1.50    Types: Cigarettes    Last attempt to quit: 02/27/1997    Years since quitting: 21.2  . Smokeless tobacco: Never Used  . Tobacco comment: remote history - stopped 19 years ago; 20 pack year history  Substance and Sexual Activity  . Alcohol use: No    Alcohol/week: 0.0 oz  . Drug use: No  . Sexual activity: Never  Lifestyle  . Physical  activity:    Days per week: 0 days    Minutes per session: 0 min  . Stress: Not at all  Relationships  . Social connections:    Talks on phone: More than three times a week    Gets together: More than three times a week    Attends religious service: Never    Active member of club or organization: No    Attends meetings of clubs or organizations: Never    Relationship status: Widowed  . Intimate partner violence:    Fear of current or ex partner: No    Emotionally abused: No    Physically abused: No    Forced sexual activity: No  Other Topics Concern  . Not on file  Social History Narrative   Widowed   No children   Never held a steady job    Ms. Mcquown's family history includes Diabetes in her brother, brother, brother, and mother; Heart attack in her brother; Heart disease in her brother, brother, and mother; Kidney disease in her father; Scoliosis in her brother and sister; Stroke in her brother.      Objective:    Vitals:   05/18/18 1028  BP: 136/68  Pulse: (!) 56    Physical Exam; well-developed elderly African-American female in no acute distress, she is in a wheelchair.  Pleasant blood pressure 136/68 pulse 56, height 5 foot 2, weight 205, BMI 37.5.  HEENT; nontraumatic normocephalic EOMI PERRLA sclera anicteric oropharynx clear, Cardiovascular regular rate and rhythm with S1-S2, Pulmonary; somewhat decreased breath sounds bilaterally, Abdomen; soft, bowel sounds are present lower midline incisional scar, small supraumbilical hernia no palpable mass or hepatosplenomegaly, Rectal ;exam not done, Extremities; no clubbing cyanosis or edema, she is holding her left upper extremity due to pain in the upper arm, Neuro psych; alert and oriented, grossly nonfocal mood and affect appropriate       Assessment & Plan:   #42 82 year old African-American female with solid food dysphagia over the past 2 months occurring with each meal and requiring frequent regurgitation. Patient  has a previously documented large hiatal hernia, no stricture noted at EGD 2012 but patient relates a more remote upper endoscopy with esophageal dilation with good results. Symptoms are consistent with esophageal stricture or ring.  #2 chronic GERD-stable on Nexium 40 mg daily #3 congestive heart failure-EF 60% #4.  Peripheral  arterial disease-on Pletal #5.  COPD-O2 dependent   #6 adult onset diabetes mellitus with peripheral neuropathy #7.  Chronic kidney disease stage III #8.  Morbid obesity #9.  Chronic constipation-negative colonoscopy 2012 with exception of mild diverticulosis  Plan; Patient will be scheduled for upper endoscopy with probable esophageal dilation with Dr. Ardis Hughs, next week at [facility].  Procedure was discussed in detail with the patient including indications risks and benefits and she is agreeable to proceed. We also discussed barium swallow but she prefers to go straight to EGD Continue Nexium 40 mg p.o. every morning She will hold Pletal for 5 days prior to procedure.  Yul Diana Genia Harold PA-C 05/18/2018   Cc: Tanda Rockers, MD

## 2018-05-18 NOTE — Patient Instructions (Signed)
If you are age 80 or older, your body mass index should be between 23-30. Your Body mass index is 37.51 kg/m. If this is out of the aforementioned range listed, please consider follow up with your Primary Care Provider.   You have been scheduled for an endoscopy. Please follow written instructions given to you at your visit today. If you use inhalers (even only as needed), please bring them with you on the day of your procedure.

## 2018-05-18 NOTE — Progress Notes (Signed)
Subjective:    Patient ID: Michelle Buck, female    DOB: [birth year], 82 y.o.   MRN: [id number]  HPI Emery is a pleasant 82 year old African-American female, referred today by Dr.Wert for evaluation of solid food dysphasia.  She is known to Dr. Ardis Hughs and was last seen in our office in 2012.  She underwent colonoscopy and endoscopy at that time.  EGD showed a 7 to 8 cm hiatal hernia and otherwise negative exam.  Colonoscopy with mild diverticulosis in the left colon, no polyps or other lesions noted. Patient has history of peripheral arterial disease, COPD for which she is on O2 chronically at 2 L, adult onset diabetes mellitus with peripheral neuropathy, chronic kidney disease stage III, morbid obesity, chronic constipation, hypertension and congestive heart failure with EF of 60%. Patient says that very remotely she had had an upper endoscopy with dilation of her esophagus and that helped for a long time, but that symptoms recurred about 2 months ago.  She says she is having trouble with each meal and feels as if her food is getting stuck in her esophagus.  She says sometimes she has to vomit or regurgitate to alleviate symptoms.  Occasionally she can stop eating and eventually the food will go on down though this can take some time.  She says she has not been eating meat recently because she cannot chew it as she needs some new dentures.  She is not generally having any difficulty with liquids.  Appetite has been okay weight has been stable.  She is maintained on Nexium 40 mg once daily for chronic GERD which generally works fairly well. She is on Pletal for peripheral arterial disease. She is currently nonambulatory and only walk with max assist, and is on chronic O2. She  states she would like to have an endoscopy and esophageal dilation.  Review of Systems Pertinent positive and negative review of systems were noted in the above HPI section.  All other review of systems was otherwise  negative.  Outpatient Encounter Medications as of 05/18/2018  Medication Sig  . albuterol (PROVENTIL HFA;VENTOLIN HFA) 108 (90 Base) MCG/ACT inhaler Inhale 2 puffs into the lungs every 4 (four) hours as needed for wheezing or shortness of breath.  Marland Kitchen aspirin EC 81 MG tablet Take 81 mg by mouth daily.  . budesonide (PULMICORT) 0.25 MG/2ML nebulizer solution USE 1 AMPULE IN NEBULIZER TWICE DAILY ROUTINELY FOR COPD  . Cholecalciferol (VITAMIN D3) 2000 units TABS Take 2,000 Units by mouth daily.  . cilostazol (PLETAL) 100 MG tablet TAKE 1 TABLET BY MOUTH ONCE DAILY  . diclofenac sodium (VOLTAREN) 1 % GEL APPLY 4 GRAMS TOPICALLY TWICE DAILY AS NEEDED FOR PAIN  . DULoxetine (CYMBALTA) 60 MG capsule Take 1 capsule (60 mg total) by mouth daily.  Marland Kitchen EASY TOUCH INSULIN SYRINGE 31G X 5/16" 0.5 ML MISC USE UP TO 2 TIMES A DAY AS DIRECTED.  Marland Kitchen esomeprazole (NEXIUM) 40 MG capsule TAKE 1 CAPSULE BY MOUTH ONCE DAILY  . furosemide (LASIX) 40 MG tablet TAKE 1 TABLET BY MOUTH TWICE A DAY  . gabapentin (NEURONTIN) 100 MG capsule TAKE 2 CAPSULES BY MOUTH THREE TIMES A DAY  . guaifenesin (ROBITUSSIN) 100 MG/5ML syrup Take 200 mg by mouth 3 (three) times daily as needed for cough.  . hydrALAZINE (APRESOLINE) 50 MG tablet TAKE 1 TABLET BY MOUTH THREE TIMES A DAY  . HYDROcodone-acetaminophen (NORCO) 10-325 MG tablet Take 1 tablet by mouth every 6 (six) hours as needed.  . insulin  aspart protamine- aspart (NOVOLOG MIX 70/30) (70-30) 100 UNIT/ML injection Inject 40 units subcutaneously with breakfast and inject 10 units subcutaneously with dinner to control blood sugar  . ipratropium-albuterol (DUONEB) 0.5-2.5 (3) MG/3ML SOLN Take 3 mLs by nebulization 2 (two) times daily. Dx: J44.1  . metoprolol succinate (TOPROL-XL) 25 MG 24 hr tablet TAKE 1 TABLET BY MOUTH ONCE DAILY WITH OR IMMEDIATELY FOLLOWING A MEAL FOR BLOOD PRESSURE  . naloxegol oxalate (MOVANTIK) 12.5 MG TABS tablet Take 1 tablet (12.5 mg total) by mouth daily.  .  ondansetron (ZOFRAN ODT) 8 MG disintegrating tablet Take 1 tablet (8 mg total) by mouth every 8 (eight) hours as needed for nausea or vomiting.  . potassium chloride SA (K-DUR,KLOR-CON) 20 MEQ tablet TAKE 1 TABLET BY MOUTH ONCE DAILY  . simvastatin (ZOCOR) 20 MG tablet TAKE 1 TABLET BY MOUTH ONCE DAILY  . sucralfate (CARAFATE) 1 g tablet TAKE 1 TABLET BY MOUTH TWICE DAILY ON EMPTY STOMACH  . tamsulosin (FLOMAX) 0.4 MG CAPS capsule Take 1 capsule (0.4 mg total) by mouth daily.  . timolol (TIMOPTIC) 0.5 % ophthalmic solution INSTILL 1 DROP INTO EACH EYE TWICE A DAY  . TRAVATAN Z 0.004 % SOLN ophthalmic solution Place 1 drop into both eyes at bedtime.   . [DISCONTINUED] budesonide (PULMICORT) 0.25 MG/2ML nebulizer solution Take one ampule in Neb twice daily routinely for COPD  J44.1   No facility-administered encounter medications on file as of 05/18/2018.    Allergies  Allergen Reactions  . Tramadol Other (See Comments)    Leg cramps   . Codeine Nausea And Vomiting    Patient states N/V with codeine  . Hydrocodone Nausea And Vomiting  . Penicillins Rash    Patient states rash/itch with penicillin Has patient had a PCN reaction causing immediate rash, facial/tongue/throat swelling, SOB or lightheadedness with hypotension: Yes- broke me out and i was itching Has patient had a PCN reaction causing severe rash involving mucus membranes or skin necrosis: No Has patient had a PCN reaction that required hospitalization Yes- i was already in the hospital Has patient had a PCN reaction occurring within the last 10 years: No If all of the above answers are "NO  . Tylenol [Acetaminophen] Hives   Patient Active Problem List   Diagnosis Date Noted  . Dysphagia 05/05/2018  . Hypercapnia 05/06/2017  . Osteoarthritis 03/25/2017  . Shoulder pain 03/25/2017  . Abnormality of gait 03/25/2017  . COPD (chronic obstructive pulmonary disease) ([place]) 09/25/2016  . Diastolic CHF ([place]) 73/22/0254  . Chest  pressure 09/25/2016  . Dyspnea and respiratory abnormality 09/23/2016  . OSA (obstructive sleep apnea) 09/02/2016  . Other fatigue 06/23/2016  . PAD (peripheral artery disease) ([place]) 05/21/2016  . Glaucoma 05/21/2016  . Bilateral edema of lower extremity   . Leg edema 01/01/2016  . CKD stage 3 due to type 2 diabetes mellitus ([place])   . CHF (congestive heart failure) ([place]) 10/29/2015  . Chronic obstructive pulmonary disease ([place]) 10/29/2015  . Lumbago   . CKD (chronic kidney disease)   . Chronic kidney disease (CKD), stage IV (severe) ([place]) 09/11/2015  . Diabetes mellitus with diabetic nephropathy, with long-term current use of insulin ([place]) 09/11/2015  . Anemia of chronic kidney failure 09/11/2015  . Morbid obesity ([place]) 08/14/2015  . Peripheral autonomic neuropathy due to diabetes mellitus ([place]) 10/24/2013  . Essential hypertension, benign 02/27/2013  . Acute on chronic diastolic congestive heart failure ([place]) 03/02/2012  . Diabetes mellitus type 2 in obese ([place]) 03/02/2012  Social History   Socioeconomic History  . Marital status: Widowed    Spouse name: Not on file  . Number of children: Not on file  . Years of education: Not on file  . Highest education level: Not on file  Occupational History  . Occupation: Retired    Fish farm manager: UNEMPLOYED  Social Needs  . Financial resource strain: Not very hard  . Food insecurity:    Worry: Never true    Inability: Never true  . Transportation needs:    Medical: No    Non-medical: No  Tobacco Use  . Smoking status: Former Smoker    Packs/day: 0.50    Years: 3.00    Pack years: 1.50    Types: Cigarettes    Last attempt to quit: 02/27/1997    Years since quitting: 21.2  . Smokeless tobacco: Never Used  . Tobacco comment: remote history - stopped 19 years ago; 20 pack year history  Substance and Sexual Activity  . Alcohol use: No    Alcohol/week: 0.0 oz  . Drug use: No  . Sexual activity: Never  Lifestyle  . Physical  activity:    Days per week: 0 days    Minutes per session: 0 min  . Stress: Not at all  Relationships  . Social connections:    Talks on phone: More than three times a week    Gets together: More than three times a week    Attends religious service: Never    Active member of club or organization: No    Attends meetings of clubs or organizations: Never    Relationship status: Widowed  . Intimate partner violence:    Fear of current or ex partner: No    Emotionally abused: No    Physically abused: No    Forced sexual activity: No  Other Topics Concern  . Not on file  Social History Narrative   Widowed   No children   Never held a steady job    Ms. Macmullen's family history includes Diabetes in her brother, brother, brother, and mother; Heart attack in her brother; Heart disease in her brother, brother, and mother; Kidney disease in her father; Scoliosis in her brother and sister; Stroke in her brother.      Objective:    Vitals:   05/18/18 1028  BP: 136/68  Pulse: (!) 56    Physical Exam; well-developed elderly African-American female in no acute distress, she is in a wheelchair.  Pleasant blood pressure 136/68 pulse 56, height 5 foot 2, weight 205, BMI 37.5.  HEENT; nontraumatic normocephalic EOMI PERRLA sclera anicteric oropharynx clear, Cardiovascular regular rate and rhythm with S1-S2, Pulmonary; somewhat decreased breath sounds bilaterally, Abdomen; soft, bowel sounds are present lower midline incisional scar, small supraumbilical hernia no palpable mass or hepatosplenomegaly, Rectal ;exam not done, Extremities; no clubbing cyanosis or edema, she is holding her left upper extremity due to pain in the upper arm, Neuro psych; alert and oriented, grossly nonfocal mood and affect appropriate       Assessment & Plan:   #43 82 year old African-American female with solid food dysphagia over the past 2 months occurring with each meal and requiring frequent regurgitation. Patient  has a previously documented large hiatal hernia, no stricture noted at EGD 2012 but patient relates a more remote upper endoscopy with esophageal dilation with good results. Symptoms are consistent with esophageal stricture or ring.  #2 chronic GERD-stable on Nexium 40 mg daily #3 congestive heart failure-EF 60% #4.  Peripheral  arterial disease-on Pletal #5.  COPD-O2 dependent   #6 adult onset diabetes mellitus with peripheral neuropathy #7.  Chronic kidney disease stage III #8.  Morbid obesity #9.  Chronic constipation-negative colonoscopy 2012 with exception of mild diverticulosis  Plan; Patient will be scheduled for upper endoscopy with probable esophageal dilation with Dr. Ardis Hughs, next week at [facility].  Procedure was discussed in detail with the patient including indications risks and benefits and she is agreeable to proceed. We also discussed barium swallow but she prefers to go straight to EGD Continue Nexium 40 mg p.o. every morning She will hold Pletal for 5 days prior to procedure.  Amy Genia Harold PA-C 05/18/2018   Cc: Tanda Rockers, MD

## 2018-05-19 ENCOUNTER — Other Ambulatory Visit: Payer: Self-pay | Admitting: Nurse Practitioner

## 2018-05-19 NOTE — Progress Notes (Signed)
I agree with the above note, plan 

## 2018-05-22 DIAGNOSIS — J449 Chronic obstructive pulmonary disease, unspecified: Secondary | ICD-10-CM | POA: Diagnosis not present

## 2018-05-25 ENCOUNTER — Other Ambulatory Visit: Payer: Self-pay | Admitting: *Deleted

## 2018-05-25 DIAGNOSIS — G8929 Other chronic pain: Secondary | ICD-10-CM

## 2018-05-25 DIAGNOSIS — M545 Low back pain: Principal | ICD-10-CM

## 2018-05-25 NOTE — Anesthesia Preprocedure Evaluation (Addendum)
Anesthesia Evaluation  Patient identified by MRN, date of birth, ID band Patient awake    Reviewed: Allergy & Precautions, NPO status , Patient's Chart, lab work & pertinent test results  Airway Mallampati: II  TM Distance: >3 FB Neck ROM: Full    Dental no notable dental hx.    Pulmonary sleep apnea , COPD, former smoker,    breath sounds clear to auscultation + decreased breath sounds      Cardiovascular hypertension, + Peripheral Vascular Disease  Normal cardiovascular exam Rhythm:Regular Rate:Normal     Neuro/Psych negative neurological ROS  negative psych ROS   GI/Hepatic Neg liver ROS, GERD  ,  Endo/Other  diabetesMorbid obesity  Renal/GU negative Renal ROS  negative genitourinary   Musculoskeletal negative musculoskeletal ROS (+)   Abdominal   Peds negative pediatric ROS (+)  Hematology  (+) anemia ,   Anesthesia Other Findings   Reproductive/Obstetrics negative OB ROS                            Anesthesia Physical Anesthesia Plan  ASA: III  Anesthesia Plan: MAC   Post-op Pain Management:    Induction: Intravenous  PONV Risk Score and Plan: 0  Airway Management Planned: Simple Face Mask  Additional Equipment:   Intra-op Plan:   Post-operative Plan:   Informed Consent: I have reviewed the patients History and Physical, chart, labs and discussed the procedure including the risks, benefits and alternatives for the proposed anesthesia with the patient or authorized representative who has indicated his/her understanding and acceptance.   Dental advisory given  Plan Discussed with: CRNA and Surgeon  Anesthesia Plan Comments:         Anesthesia Quick Evaluation

## 2018-05-25 NOTE — Telephone Encounter (Signed)
Will be sent on Friday, last fill date was 04/27/18

## 2018-05-25 NOTE — Telephone Encounter (Signed)
Ok to refill? Last filled 04/27/18 #120.  [facility] verified #120 on 04/27/18. Requests return call when sent.

## 2018-05-25 NOTE — Telephone Encounter (Signed)
Patient notified

## 2018-05-26 ENCOUNTER — Ambulatory Visit (HOSPITAL_COMMUNITY): Payer: Medicare HMO | Admitting: Certified Registered Nurse Anesthetist

## 2018-05-26 ENCOUNTER — Ambulatory Visit (HOSPITAL_COMMUNITY)
Admission: RE | Admit: 2018-05-26 | Discharge: 2018-05-26 | Disposition: A | Discharge status: Home / Self Care | Payer: Medicare HMO | Source: Ambulatory Visit | Attending: Gastroenterology | Admitting: Gastroenterology

## 2018-05-26 ENCOUNTER — Encounter (HOSPITAL_COMMUNITY): Payer: Self-pay | Admitting: *Deleted

## 2018-05-26 ENCOUNTER — Encounter (HOSPITAL_COMMUNITY)
Admission: RE | Disposition: A | Discharge status: Home / Self Care | Payer: Self-pay | Source: Ambulatory Visit | Attending: Gastroenterology

## 2018-05-26 ENCOUNTER — Other Ambulatory Visit: Payer: Self-pay

## 2018-05-26 DIAGNOSIS — K219 Gastro-esophageal reflux disease without esophagitis: Secondary | ICD-10-CM

## 2018-05-26 DIAGNOSIS — Z794 Long term (current) use of insulin: Secondary | ICD-10-CM | POA: Diagnosis not present

## 2018-05-26 DIAGNOSIS — I509 Heart failure, unspecified: Secondary | ICD-10-CM | POA: Diagnosis not present

## 2018-05-26 DIAGNOSIS — K228 Other specified diseases of esophagus: Secondary | ICD-10-CM | POA: Diagnosis not present

## 2018-05-26 DIAGNOSIS — Z87891 Personal history of nicotine dependence: Secondary | ICD-10-CM | POA: Insufficient documentation

## 2018-05-26 DIAGNOSIS — N183 Chronic kidney disease, stage 3 (moderate): Secondary | ICD-10-CM | POA: Diagnosis not present

## 2018-05-26 DIAGNOSIS — I5033 Acute on chronic diastolic (congestive) heart failure: Secondary | ICD-10-CM | POA: Diagnosis not present

## 2018-05-26 DIAGNOSIS — Z79899 Other long term (current) drug therapy: Secondary | ICD-10-CM | POA: Diagnosis not present

## 2018-05-26 DIAGNOSIS — E1122 Type 2 diabetes mellitus with diabetic chronic kidney disease: Secondary | ICD-10-CM | POA: Diagnosis not present

## 2018-05-26 DIAGNOSIS — Z7982 Long term (current) use of aspirin: Secondary | ICD-10-CM | POA: Insufficient documentation

## 2018-05-26 DIAGNOSIS — K449 Diaphragmatic hernia without obstruction or gangrene: Secondary | ICD-10-CM | POA: Diagnosis not present

## 2018-05-26 DIAGNOSIS — R1310 Dysphagia, unspecified: Secondary | ICD-10-CM | POA: Diagnosis not present

## 2018-05-26 DIAGNOSIS — I130 Hypertensive heart and chronic kidney disease with heart failure and stage 1 through stage 4 chronic kidney disease, or unspecified chronic kidney disease: Secondary | ICD-10-CM | POA: Insufficient documentation

## 2018-05-26 DIAGNOSIS — J449 Chronic obstructive pulmonary disease, unspecified: Secondary | ICD-10-CM | POA: Diagnosis not present

## 2018-05-26 DIAGNOSIS — Z6837 Body mass index (BMI) 37.0-37.9, adult: Secondary | ICD-10-CM | POA: Diagnosis not present

## 2018-05-26 DIAGNOSIS — K089 Disorder of teeth and supporting structures, unspecified: Secondary | ICD-10-CM

## 2018-05-26 DIAGNOSIS — N184 Chronic kidney disease, stage 4 (severe): Secondary | ICD-10-CM | POA: Diagnosis not present

## 2018-05-26 HISTORY — PX: ESOPHAGOGASTRODUODENOSCOPY (EGD) WITH PROPOFOL: SHX5813

## 2018-05-26 LAB — GLUCOSE, CAPILLARY: Glucose-Capillary: 70 mg/dL (ref 70–99)

## 2018-05-26 SURGERY — ESOPHAGOGASTRODUODENOSCOPY (EGD) WITH PROPOFOL
Anesthesia: Monitor Anesthesia Care

## 2018-05-26 MED ORDER — GLYCOPYRROLATE 0.2 MG/ML IJ SOLN
INTRAMUSCULAR | Status: DC | PRN
  Administered 2018-05-26: 0.2 mg via INTRAVENOUS

## 2018-05-26 MED ORDER — LACTATED RINGERS IV SOLN
INTRAVENOUS | Status: DC
  Administered 2018-05-26: 10:00:00 via INTRAVENOUS

## 2018-05-26 MED ORDER — SODIUM CHLORIDE 0.9 % IV SOLN: INTRAVENOUS | Status: DC

## 2018-05-26 MED ORDER — PROPOFOL 10 MG/ML IV BOLUS
INTRAVENOUS | Status: AC
  Filled 2018-05-26: qty 60

## 2018-05-26 MED ORDER — PROPOFOL 500 MG/50ML IV EMUL
INTRAVENOUS | Status: DC | PRN
  Administered 2018-05-26: 100 ug/kg/min via INTRAVENOUS

## 2018-05-26 MED ORDER — PROPOFOL 10 MG/ML IV BOLUS
INTRAVENOUS | Status: DC | PRN
  Administered 2018-05-26: 10 mg via INTRAVENOUS
  Administered 2018-05-26: 20 mg via INTRAVENOUS

## 2018-05-26 SURGICAL SUPPLY — 14 items

## 2018-05-26 NOTE — Transfer of Care (Signed)
Immediate Anesthesia Transfer of Care Note  Patient: Michelle Buck  Procedure(s) Performed: ESOPHAGOGASTRODUODENOSCOPY (EGD) WITH PROPOFOL (N/A )  Patient Location: PACU and Endoscopy Unit  Anesthesia Type:MAC  Level of Consciousness: awake, alert  Airway & Oxygen Therapy: Patient Spontanous Breathing and Patient connected to nasal cannula oxygen  Post-op Assessment: Report given to RN and Post -op Vital signs reviewed and stable  Post vital signs: Reviewed and stable  Last Vitals:  Vitals Value Taken Time  BP    Temp    Pulse    Resp    SpO2      Last Pain:  Vitals:   05/26/18 0940  TempSrc: Oral  PainSc: 0-No pain         Complications: No apparent anesthesia complications

## 2018-05-26 NOTE — Interval H&P Note (Signed)
History and Physical Interval Note:  05/26/2018 9:57 AM  Michelle Buck  has presented today for surgery, with the diagnosis of Dysphagia- solids, Chronic GERD  The various methods of treatment have been discussed with the patient and family. After consideration of risks, benefits and other options for treatment, the patient has consented to  Procedure(s): ESOPHAGOGASTRODUODENOSCOPY (EGD) WITH PROPOFOL (N/A) as a surgical intervention .  The patient's history has been reviewed, patient examined, no change in status, stable for surgery.  I have reviewed the patient's chart and labs.  Questions were answered to the patient's satisfaction.     Milus Banister

## 2018-05-26 NOTE — Anesthesia Procedure Notes (Signed)
Date/Time: 05/26/2018 10:09 AM Performed by: Claudia Desanctis, CRNA Oxygen Delivery Method: Nasal cannula

## 2018-05-26 NOTE — Anesthesia Postprocedure Evaluation (Signed)
Anesthesia Post Note  Patient: Michelle Buck  Procedure(s) Performed: ESOPHAGOGASTRODUODENOSCOPY (EGD) WITH PROPOFOL (N/A )     Patient location during evaluation: PACU Anesthesia Type: MAC Level of consciousness: awake and alert Pain management: pain level controlled Vital Signs Assessment: post-procedure vital signs reviewed and stable Respiratory status: spontaneous breathing, nonlabored ventilation, respiratory function stable and patient connected to nasal cannula oxygen Cardiovascular status: stable and blood pressure returned to baseline Postop Assessment: no apparent nausea or vomiting Anesthetic complications: no    Last Vitals:  Vitals:   05/26/18 1030 05/26/18 1045  BP: 101/62 129/66  Pulse:    Resp: 16 18  Temp: 36.7 C   SpO2: 97% 98%    Last Pain:  Vitals:   05/26/18 1045  TempSrc:   PainSc: 0-No pain                 Raymond Azure S

## 2018-05-26 NOTE — Op Note (Signed)
[facility] Patient Name: Michelle Buck Procedure Date: 05/26/2018 MRN: [id number] Attending MD: Milus Banister , MD Date of Birth: Jul 24, 1936 CSN: [id number] Age: 82 Admit Type: Outpatient Procedure:                Upper GI endoscopy Indications:              Dysphagia Providers:                Milus Banister, MD, Cleda Daub, RN, Charolette Child, Technician Referring MD:              Medicines:                Monitored Anesthesia Care Complications:            No immediate complications. Estimated blood loss:                            None. Estimated Blood Loss:     Estimated blood loss: none. Procedure:                Pre-Anesthesia Assessment:                           - Prior to the procedure, a History and Physical                            was performed, and patient medications and                            allergies were reviewed. The patient's tolerance of                            previous anesthesia was also reviewed. The risks                            and benefits of the procedure and the sedation                            options and risks were discussed with the patient.                            All questions were answered, and informed consent                            was obtained. Prior Anticoagulants: The patient has                            taken Pletal (cilostazol), last dose was 5 days                            prior to procedure. ASA Grade Assessment: IV - A                            patient  with severe systemic disease that is a                            constant threat to life. After reviewing the risks                            and benefits, the patient was deemed in                            satisfactory condition to undergo the procedure.                           After obtaining informed consent, the endoscope was                            passed under direct vision. Throughout the                 procedure, the patient's blood pressure, pulse, and                            oxygen saturations were monitored continuously. The                            [id number] ) was introduced through the                            mouth, and advanced to the second part of duodenum.                            The upper GI endoscopy was accomplished without                            difficulty. The patient tolerated the procedure                            well. Scope In: Scope Out: Findings:      A medium sized hiatal hernia without evident Cameron's erosions was       noted along with typical foreshortened and distally tortuous esophagus.       There were no esophageal strictures.      The examination was otherwise normal. Impression:               - A medium sized hiatal hernia without evident                            Cameron's erosions was noted along with typical                            foreshortened and distally tortuous esophagus.                            There were no esophageal strictures.                           -  Your swallowing difficulty is due to your                            tortuous esophagus, medium sized hiatal hernia, and                            poorly fitting dentures. Moderate Sedation:      N/A- Per Anesthesia Care Recommendation:           - Patient has a contact number available for                            emergencies. The signs and symptoms of potential                            delayed complications were discussed with the                            patient. Return to normal activities tomorrow.                            Written discharge instructions were provided to the                            patient.                           - Resume previous diet. Continue getting your                            dentures fitted so that you can chew as best as                            possible. Eat slowly and take small bites.                            - Continue present medications. Procedure Code(s):        --- Professional ---                           [phone], Esophagogastroduodenoscopy, flexible,                            transoral; diagnostic, including collection of                            specimen(s) by brushing or washing, when performed                            (separate procedure) Diagnosis Code(s):        --- Professional ---                           K44.9, Diaphragmatic hernia without obstruction or  gangrene                           R13.10, Dysphagia, unspecified CPT copyright 2017 American Medical Association. All rights reserved. The codes documented in this report are preliminary and upon coder review may  be revised to meet current compliance requirements. Milus Banister, MD 05/26/2018 10:26:21 AM This report has been signed electronically. Number of Addenda: 0

## 2018-05-26 NOTE — Discharge Instructions (Signed)
YOU HAD AN ENDOSCOPIC PROCEDURE TODAY: Refer to the procedure report and other information in the discharge instructions given to you for any specific questions about what was found during the examination. If this information does not answer your questions, please call [place] office at [phone] to clarify.  ° °YOU SHOULD EXPECT: Some feelings of bloating in the abdomen. Passage of more gas than usual. Walking can help get rid of the air that was put into your GI tract during the procedure and reduce the bloating. If you had a lower endoscopy (such as a colonoscopy or flexible sigmoidoscopy) you may notice spotting of blood in your stool or on the toilet paper. Some abdominal soreness may be present for a day or two, also. ° °DIET: Your first meal following the procedure should be a light meal and then it is ok to progress to your normal diet. A half-sandwich or bowl of soup is an example of a good first meal. Heavy or fried foods are harder to digest and may make you feel nauseous or bloated. Drink plenty of fluids but you should avoid alcoholic beverages for 24 hours. If you had a esophageal dilation, please see attached instructions for diet.   ° °ACTIVITY: Your care partner should take you home directly after the procedure. You should plan to take it easy, moving slowly for the rest of the day. You can resume normal activity the day after the procedure however YOU SHOULD NOT DRIVE, use power tools, machinery or perform tasks that involve climbing or major physical exertion for 24 hours (because of the sedation medicines used during the test).  ° °SYMPTOMS TO REPORT IMMEDIATELY: °A gastroenterologist can be reached at any hour. Please call [phone]  for any of the following symptoms:  °Following lower endoscopy (colonoscopy, flexible sigmoidoscopy) °Excessive amounts of blood in the stool  °Significant tenderness, worsening of abdominal pains  °Swelling of the abdomen that is new, acute  °Fever of 100° or  higher  °Following upper endoscopy (EGD, EUS, ERCP, esophageal dilation) °Vomiting of blood or coffee ground material  °New, significant abdominal pain  °New, significant chest pain or pain under the shoulder blades  °Painful or persistently difficult swallowing  °New shortness of breath  °Black, tarry-looking or red, bloody stools ° °FOLLOW UP:  °If any biopsies were taken you will be contacted by phone or by letter within the next 1-3 weeks. Call [phone]  if you have not heard about the biopsies in 3 weeks.  °Please also call with any specific questions about appointments or follow up tests. ° °

## 2018-05-27 ENCOUNTER — Encounter (HOSPITAL_COMMUNITY): Payer: Self-pay | Admitting: Gastroenterology

## 2018-05-27 MED ORDER — HYDROCODONE-ACETAMINOPHEN 10-325 MG PO TABS: 1.0000 | ORAL_TABLET | Freq: Four times a day (QID) | ORAL | 0 refills | Status: DC | PRN

## 2018-06-09 DIAGNOSIS — R69 Illness, unspecified: Secondary | ICD-10-CM | POA: Diagnosis not present

## 2018-06-09 DIAGNOSIS — G4733 Obstructive sleep apnea (adult) (pediatric): Secondary | ICD-10-CM | POA: Diagnosis not present

## 2018-06-14 DIAGNOSIS — J449 Chronic obstructive pulmonary disease, unspecified: Secondary | ICD-10-CM | POA: Diagnosis not present

## 2018-06-17 ENCOUNTER — Other Ambulatory Visit: Payer: Self-pay | Admitting: Nurse Practitioner

## 2018-06-20 DIAGNOSIS — H524 Presbyopia: Secondary | ICD-10-CM | POA: Diagnosis not present

## 2018-06-20 DIAGNOSIS — H52203 Unspecified astigmatism, bilateral: Secondary | ICD-10-CM | POA: Diagnosis not present

## 2018-06-20 DIAGNOSIS — E119 Type 2 diabetes mellitus without complications: Secondary | ICD-10-CM | POA: Diagnosis not present

## 2018-06-20 DIAGNOSIS — H401131 Primary open-angle glaucoma, bilateral, mild stage: Secondary | ICD-10-CM | POA: Diagnosis not present

## 2018-06-20 DIAGNOSIS — H5213 Myopia, bilateral: Secondary | ICD-10-CM | POA: Diagnosis not present

## 2018-06-20 LAB — HM DIABETES EYE EXAM

## 2018-06-21 DIAGNOSIS — J449 Chronic obstructive pulmonary disease, unspecified: Secondary | ICD-10-CM | POA: Diagnosis not present

## 2018-06-22 ENCOUNTER — Telehealth: Payer: Self-pay | Admitting: *Deleted

## 2018-06-22 ENCOUNTER — Other Ambulatory Visit: Payer: Self-pay | Admitting: Nurse Practitioner

## 2018-06-22 DIAGNOSIS — J441 Chronic obstructive pulmonary disease with (acute) exacerbation: Secondary | ICD-10-CM

## 2018-06-22 DIAGNOSIS — M549 Dorsalgia, unspecified: Secondary | ICD-10-CM | POA: Diagnosis not present

## 2018-06-22 DIAGNOSIS — N184 Chronic kidney disease, stage 4 (severe): Secondary | ICD-10-CM | POA: Diagnosis not present

## 2018-06-22 DIAGNOSIS — N140 Analgesic nephropathy: Secondary | ICD-10-CM | POA: Diagnosis not present

## 2018-06-22 DIAGNOSIS — I129 Hypertensive chronic kidney disease with stage 1 through stage 4 chronic kidney disease, or unspecified chronic kidney disease: Secondary | ICD-10-CM | POA: Diagnosis not present

## 2018-06-22 NOTE — Telephone Encounter (Signed)
Patient dropped off SCAT Application to be filled out.  GTA [phone] Placed in Dr. Cyndi Lennert folder to review, fill out and sign due to Janett Billow being out on leave.

## 2018-06-23 NOTE — Telephone Encounter (Signed)
Can you please contact Ms. Guise and find out if this form can wait until her 8/20 appointment with Dr. Eulas Post since I have not seen her myself?

## 2018-06-24 NOTE — Telephone Encounter (Signed)
Spoke with Michelle Buck, Patient states she needs the paperwork completed prior to appointment for the paperwork is to get transportation to the 07/19/18 appointment.  Patient states paperwork can wait for Dr.Carter to return next week for completion if Dr.Reed is unable to complete   Please advise

## 2018-06-24 NOTE — Telephone Encounter (Signed)
Let's save the paperwork for Dr. Eulas Post since she has actually seen Ms. Montminy--I moved it to her file this morning after we spoke about this.  I'm not certain about her functional abilities to properly fill in the form.

## 2018-06-27 ENCOUNTER — Other Ambulatory Visit: Payer: Self-pay | Admitting: *Deleted

## 2018-06-27 DIAGNOSIS — M545 Low back pain: Principal | ICD-10-CM

## 2018-06-27 DIAGNOSIS — G8929 Other chronic pain: Secondary | ICD-10-CM

## 2018-06-27 MED ORDER — HYDROCODONE-ACETAMINOPHEN 10-325 MG PO TABS: 1.0000 | ORAL_TABLET | Freq: Four times a day (QID) | ORAL | 0 refills | Status: DC | PRN

## 2018-06-27 NOTE — Telephone Encounter (Signed)
Patient requested [facility]: 05/27/2018 Pharmacy Confirmed Pended Rx and sent to Dr. Mariea Clonts for approval. ([facility] Patient)

## 2018-07-01 ENCOUNTER — Telehealth: Payer: Self-pay

## 2018-07-01 NOTE — Telephone Encounter (Signed)
I called patient to let her know that the scat forms have been completed. She stated that she would pick them up. They have been placed in front desk filing cabinet.

## 2018-07-02 ENCOUNTER — Other Ambulatory Visit: Payer: Self-pay | Admitting: Internal Medicine

## 2018-07-02 DIAGNOSIS — Z794 Long term (current) use of insulin: Secondary | ICD-10-CM

## 2018-07-02 DIAGNOSIS — E1121 Type 2 diabetes mellitus with diabetic nephropathy: Secondary | ICD-10-CM

## 2018-07-02 DIAGNOSIS — M4316 Spondylolisthesis, lumbar region: Secondary | ICD-10-CM

## 2018-07-06 ENCOUNTER — Ambulatory Visit (INDEPENDENT_AMBULATORY_CARE_PROVIDER_SITE_OTHER): Payer: Medicare HMO | Admitting: Cardiovascular Disease

## 2018-07-06 ENCOUNTER — Encounter: Payer: Self-pay | Admitting: Cardiovascular Disease

## 2018-07-06 DIAGNOSIS — I5032 Chronic diastolic (congestive) heart failure: Secondary | ICD-10-CM

## 2018-07-06 DIAGNOSIS — I110 Hypertensive heart disease with heart failure: Secondary | ICD-10-CM | POA: Diagnosis not present

## 2018-07-06 DIAGNOSIS — E7800 Pure hypercholesterolemia, unspecified: Secondary | ICD-10-CM

## 2018-07-06 DIAGNOSIS — J449 Chronic obstructive pulmonary disease, unspecified: Secondary | ICD-10-CM | POA: Diagnosis not present

## 2018-07-06 DIAGNOSIS — I739 Peripheral vascular disease, unspecified: Secondary | ICD-10-CM | POA: Diagnosis not present

## 2018-07-06 NOTE — Patient Instructions (Addendum)
Medication Instructions:  Your physician recommends that you continue on your current medications as directed. Please refer to the Current Medication list given to you today.  Labwork: NONE  Testing/Procedures: NONE  Follow-Up: Your physician recommends that you schedule a follow-up appointment in: [place] [facility]  Your physician wants you to follow-up in: [place] will receive a reminder letter in the mail two months in advance. If you don't receive a letter, please call our office to schedule the follow-up appointment.  If you need a refill on your cardiac medications before your next appointment, please call your pharmacy.

## 2018-07-06 NOTE — Progress Notes (Signed)
Cardiology Office Note   Date:  07/06/2018   ID:  Michelle Buck, DOB [birth year], MRN [id number]  PCP:  [name], NP  Cardiologist:   Skeet Latch, MD   Chief Complaint  Patient presents with  . Follow-up    6 months     History of Present Illness: Michelle Buck is a 82 y.o. female ith hypertension, diabetes, COPD, CKD IV, chronic diastolic heart failure who presents for follow up.  Michelle Buck was admitted to the hospital 08/2015 with hypoxia thought to be due to acute on chronic diastolic heart failure.  She initially went to the hospital because her home health nurse noted lower extremity edema and shortness of breath.  Echo revealed LVEF 22-29% and diastolic function was not assessed.  She was diuresed with Lasix IV and discharged with lasix 40mg  po bid.  Her discharge weight was 103 kg.  At her follow-up appointment she reported chest pain and shortness breath. She was referred for a Lexiscan Myoview 12/20/15 that was negative for ischemia and revealed LVEF 69%.  She was again admitted 08/2016 with a heart failure exacerbation.  Echo that admission revealed LVEF 60-65% with grade 2 diastolic dysfunction.  Since her last appointment Michelle Buck has been welll physically.  She has had several family members die.  She young man she raised was shot and killed last year.  She has intermittent chest pain that occurs after eating certain foods.  She has a known hiatal hernia that is bothersome to her.  She has no exertional chest pain or shortness of breath.  She does not walk much anymore because her legs get weak.  She is only able to walk with assistance and has to stop after 5 minutes of standing.  She recently fell coming out of the bathroom.  Evening there is no one at home to help her with getting up and down and she is very unsteady on her feet.  She has an aide who comes to the home several hours during the day but is at home alone in the evenings.   Past Medical History:    Diagnosis Date  . Acute bronchitis   . Anxiety   . Arthritis   . Chronic kidney disease, stage II (mild)   . Complications affecting other specified body systems, hypertension   . Congestive heart failure, unspecified    stress test in March at [facility]  . COPD (chronic obstructive pulmonary disease) ([place])    present for several years, diagnosed in the last few years  . Degenerative arthritis of knee, bilateral   . Diabetes mellitus   . Diaphragmatic hernia without mention of obstruction or gangrene   . Disorder of bone and cartilage, unspecified   . GERD (gastroesophageal reflux disease)   . Hiatal hernia   . Hyperlipidemia   . Hypertension   . Hypopotassemia   . Insomnia, unspecified   . Lumbago   . Obese   . Osteoarthrosis, unspecified whether generalized or localized, unspecified site   . PONV (postoperative nausea and vomiting)   . Scoliosis   . Secondary diabetes mellitus with renal manifestations, not stated as uncontrolled, or unspecified(249.40)   . Unspecified glaucoma(365.9)   . Unspecified hereditary and idiopathic peripheral neuropathy     Past Surgical History:  Procedure Laterality Date  . ABDOMINAL HYSTERECTOMY    . COLONOSCOPY  08/06/2011  . ESOPHAGOGASTRODUODENOSCOPY (EGD) WITH PROPOFOL N/A 05/26/2018   Procedure: ESOPHAGOGASTRODUODENOSCOPY (EGD) WITH PROPOFOL;  Surgeon: Milus Banister,  MD;  Location: [facility];  Service: Endoscopy;  Laterality: N/A;  . FOOT SURGERY    . KNEE ARTHROSCOPY     bilateral  . SHOULDER SURGERY    . TOTAL HIP ARTHROPLASTY     bilateral     Current Outpatient Medications  Medication Sig Dispense Refill  . albuterol (PROVENTIL HFA;VENTOLIN HFA) 108 (90 Base) MCG/ACT inhaler Inhale 2 puffs into the lungs every 4 (four) hours as needed for wheezing or shortness of breath. 6.7 g 3  . aspirin EC 81 MG tablet Take 81 mg by mouth daily.    . budesonide (PULMICORT) 0.25 MG/2ML nebulizer solution USE 1 AMPULE IN  NEBULIZER TWICE DAILY ROUTINELY FOR COPD 120 mL 6  . Cholecalciferol (VITAMIN D3) 2000 units TABS Take 2,000 Units by mouth daily.    . cilostazol (PLETAL) 100 MG tablet TAKE 1 TABLET BY MOUTH ONCE DAILY 30 tablet 5  . cycloSPORINE (RESTASIS) 0.05 % ophthalmic emulsion Place 1 drop into both eyes 2 (two) times daily.    . diclofenac sodium (VOLTAREN) 1 % GEL APPLY 4 GRAMS TOPICALLY TWICE DAILY AS NEEDED FOR PAIN 200 g 4  . DULoxetine (CYMBALTA) 30 MG capsule Take 30 mg by mouth daily.    Marland Kitchen EASY TOUCH INSULIN SYRINGE 31G X 5/16" 0.5 ML MISC USE UP TO 2 TIMES A DAY AS DIRECTED 100 each 9  . esomeprazole (NEXIUM) 40 MG capsule TAKE 1 CAPSULE BY MOUTH ONCE DAILY 30 capsule 5  . furosemide (LASIX) 40 MG tablet TAKE 1 TABLET BY MOUTH TWICE A DAY 60 tablet 3  . gabapentin (NEURONTIN) 100 MG capsule TAKE 2 CAPSULES BY MOUTH THREE TIMES A DAY 180 capsule 1  . gabapentin (NEURONTIN) 300 MG capsule TAKE 1 CAPSULE BY MOUTH TWICE A DAY 180 capsule 2  . guaifenesin (ROBITUSSIN) 100 MG/5ML syrup Take 200 mg by mouth 3 (three) times daily as needed for cough.    . hydrALAZINE (APRESOLINE) 50 MG tablet TAKE 1 TABLET BY MOUTH THREE TIMES A DAY 90 tablet 1  . HYDROcodone-acetaminophen (NORCO) 10-325 MG tablet Take 1 tablet by mouth every 6 (six) hours as needed. 120 tablet 0  . insulin aspart protamine- aspart (NOVOLOG MIX 70/30) (70-30) 100 UNIT/ML injection Inject 40 units subcutaneously with breakfast and inject 10 units subcutaneously with dinner to control blood sugar 20 mL 11  . ipratropium-albuterol (DUONEB) 0.5-2.5 (3) MG/3ML SOLN Take 3 mLs by nebulization 2 (two) times daily. Dx: J44.1 180 mL 5  . lubiprostone (AMITIZA) 24 MCG capsule Take 24 mcg by mouth 2 (two) times daily.    . metoprolol succinate (TOPROL-XL) 25 MG 24 hr tablet TAKE 1 TABLET BY MOUTH ONCE DAILY WITH OR IMMEDIATELY FOLLOWING A MEAL FOR BLOOD PRESSURE 30 tablet 5  . Multiple Vitamin (MULTIVITAMIN) tablet Take 1 tablet by mouth daily.      . naloxegol oxalate (MOVANTIK) 12.5 MG TABS tablet Take 1 tablet (12.5 mg total) by mouth daily. 30 tablet 2  . ondansetron (ZOFRAN ODT) 8 MG disintegrating tablet Take 1 tablet (8 mg total) by mouth every 8 (eight) hours as needed for nausea or vomiting. 10 tablet 0  . potassium chloride SA (K-DUR,KLOR-CON) 20 MEQ tablet TAKE 1 TABLET BY MOUTH ONCE DAILY 90 tablet 1  . simvastatin (ZOCOR) 20 MG tablet TAKE 1 TABLET BY MOUTH ONCE DAILY 30 tablet 5  . sucralfate (CARAFATE) 1 g tablet TAKE 1 TABLET BY MOUTH TWICE DAILY ON EMPTY STOMACH 60 tablet 5  . tamsulosin (FLOMAX) 0.4  MG CAPS capsule Take 1 capsule (0.4 mg total) by mouth daily. 7 capsule 0  . timolol (TIMOPTIC) 0.5 % ophthalmic solution INSTILL 1 DROP INTO EACH EYE TWICE A DAY 5 mL 4  . TRAVATAN Z 0.004 % SOLN ophthalmic solution Place 1 drop into both eyes at bedtime.      No current facility-administered medications for this visit.     Allergies:   Tramadol; Codeine; Hydrocodone; Penicillins; and Tylenol [acetaminophen]    Social History:  The patient  reports that she quit smoking about 21 years ago. Her smoking use included cigarettes. She has a 1.50 pack-year smoking history. She has never used smokeless tobacco. She reports that she does not drink alcohol or use drugs.   Family History:  The patient's family history includes Diabetes in her brother, brother, brother, and mother; Heart attack in her brother; Heart disease in her brother, brother, and mother; Kidney disease in her father; Scoliosis in her brother and sister; Stroke in her brother.    ROS:  Please see the history of present illness.   Otherwise, review of systems are positive for constipation.   All other systems are reviewed and negative.    PHYSICAL EXAM: VS:  BP (!) 100/54 (BP Location: Left Arm, Patient Position: Sitting, Cuff Size: Large)   Pulse 68   Ht 5\' 2"  (1.575 m)   Wt 208 lb (94.3 kg)   BMI 38.04 kg/m   , BMI Body mass index is 38.04  kg/m. GENERAL:  Chronically ill-appearing.  No acute distress. HEENT: Pupils equal round and reactive, fundi not visualized, oral mucosa unremarkable NECK:  No jugular venous distention, waveform within normal limits, carotid upstroke brisk and symmetric, no bruits LUNGS:  Clear to auscultation bilaterally HEART:  RRR.  PMI not displaced or sustained,S1 and S2 within normal limits, no S3, no S4, no clicks, no rubs, no murmurs ABD:  Flat, positive bowel sounds normal in frequency in pitch, no bruits, no rebound, no guarding, no midline pulsatile mass, no hepatomegaly, no splenomegaly EXT:  2 plus pulses throughout, no edema, no cyanosis no clubbing SKIN:  No rashes no nodules NEURO:  Cranial nerves II through XII grossly intact, motor grossly intact throughout PSYCH:  Cognitively intact, oriented to person place and time   EKG:  EKG is ordered today. 07/06/2018: Sinus rhythm.  Rate 68 bpm.  Echo 09/25/16: Study Conclusions  - Left ventricle: The cavity size was normal. Wall thickness was   normal. Systolic function was normal. The estimated ejection   fraction was in the range of 60% to 65%. Wall motion was normal;   there were no regional wall motion abnormalities. Features are   consistent with a pseudonormal left ventricular filling pattern,   with concomitant abnormal relaxation and increased filling   pressure (grade 2 diastolic dysfunction). - Left atrium: The atrium was mildly dilated. - Right atrium: The atrium was mildly dilated.  Lexiscan Myoview 12/20/15:  Nuclear stress EF: 69%.  The left ventricular ejection fraction is hyperdynamic (>65%).  The study is normal.  This is a low risk study.  Recent Labs: 03/24/2018: ALT 8; BUN 24; Creat 2.25; Potassium 3.7; Sodium 143    Lipid Panel    Component Value Date/Time   CHOL 164 11/24/2017 0923   CHOL 147 03/14/2015 0806   TRIG 146 11/24/2017 0923   HDL 64 11/24/2017 0923   HDL 41 03/14/2015 0806   CHOLHDL 2.6  11/24/2017 0923   VLDL 40 05/14/2010 2347   LDLCALC  75 11/24/2017 0923      Wt Readings from Last 3 Encounters:  07/06/18 208 lb (94.3 kg)  05/26/18 205 lb 1 oz (93 kg)  05/18/18 205 lb 1 oz (93 kg)      ASSESSMENT AND PLAN:  # Chronic diastolic heart failure:  # Hypertensive heart disease: # Shortness of breath: Ms. Neukam is euvolemic and her weight is stable.  She has no cardiac complaints at this time.  Continue hydralazine, metoprolol, and furosemide.  # Atypical chest pain: Lexiscan Myoview was negative 12/2015 and symptoms are unchanged. Continue aspirin and simvastatin.  # Hyperlipidemia: Continue simvastatin.  This is managed by her PCP.  # PAD: Continue aspirin, cilostazol and simvastatin.  # Hyperlipidemia: LDL 75 10/2017.  Current medicines are reviewed at length with the patient today.  The patient does not have concerns regarding medicines.  The following changes have been made:  no change  Labs/ tests ordered today include:   No orders of the defined types were placed in this encounter.    Disposition:   FU with Natthew Marlatt C. Oval Linsey, MD, [facility] in 1 year.  She will see Rosaria Ferries, PA-C in 6 months   Signed, Yiselle Babich C. Oval Linsey, MD, [facility]  07/06/2018 1:29 PM    [facility]

## 2018-07-11 ENCOUNTER — Other Ambulatory Visit: Payer: Self-pay | Admitting: Nurse Practitioner

## 2018-07-11 DIAGNOSIS — G4733 Obstructive sleep apnea (adult) (pediatric): Secondary | ICD-10-CM | POA: Diagnosis not present

## 2018-07-13 ENCOUNTER — Other Ambulatory Visit: Payer: Medicare HMO

## 2018-07-13 DIAGNOSIS — E1121 Type 2 diabetes mellitus with diabetic nephropathy: Secondary | ICD-10-CM

## 2018-07-13 DIAGNOSIS — E785 Hyperlipidemia, unspecified: Secondary | ICD-10-CM

## 2018-07-13 DIAGNOSIS — Z794 Long term (current) use of insulin: Secondary | ICD-10-CM

## 2018-07-14 LAB — CBC WITH DIFFERENTIAL/PLATELET
Basophils Absolute: 42 cells/uL (ref 0–200)
Basophils Relative: 0.7 %
EOS ABS: 420 {cells}/uL (ref 15–500)
Eosinophils Relative: 7 %
HEMATOCRIT: 30.9 % — AB (ref 35.0–45.0)
Hemoglobin: 9.9 g/dL — ABNORMAL LOW (ref 11.7–15.5)
LYMPHS ABS: 2226 {cells}/uL (ref 850–3900)
MCH: 27.1 pg (ref 27.0–33.0)
MCHC: 32 g/dL (ref 32.0–36.0)
MCV: 84.7 fL (ref 80.0–100.0)
MPV: 10.1 fL (ref 7.5–12.5)
Monocytes Relative: 7.9 %
NEUTROS PCT: 47.3 %
Neutro Abs: 2838 cells/uL (ref 1500–7800)
Platelets: 232 10*3/uL (ref 140–400)
RBC: 3.65 10*6/uL — ABNORMAL LOW (ref 3.80–5.10)
RDW: 14.5 % (ref 11.0–15.0)
Total Lymphocyte: 37.1 %
WBC: 6 10*3/uL (ref 3.8–10.8)
WBCMIX: 474 {cells}/uL (ref 200–950)

## 2018-07-14 LAB — COMPLETE METABOLIC PANEL WITH GFR
AG RATIO: 1.4 (calc) (ref 1.0–2.5)
ALT: 7 U/L (ref 6–29)
AST: 14 U/L (ref 10–35)
Albumin: 3.5 g/dL — ABNORMAL LOW (ref 3.6–5.1)
Alkaline phosphatase (APISO): 91 U/L (ref 33–130)
BUN/Creatinine Ratio: 10 (calc) (ref 6–22)
BUN: 22 mg/dL (ref 7–25)
CALCIUM: 9 mg/dL (ref 8.6–10.4)
CO2: 32 mmol/L (ref 20–32)
CREATININE: 2.2 mg/dL — AB (ref 0.60–0.88)
Chloride: 97 mmol/L — ABNORMAL LOW (ref 98–110)
GFR, EST AFRICAN AMERICAN: 24 mL/min/{1.73_m2} — AB (ref >=60)
GFR, EST NON AFRICAN AMERICAN: 20 mL/min/{1.73_m2} — AB (ref >=60)
GLOBULIN: 2.5 g/dL (ref 1.9–3.7)
Glucose, Bld: 171 mg/dL — ABNORMAL HIGH (ref 65–99)
POTASSIUM: 3.7 mmol/L (ref 3.5–5.3)
SODIUM: 139 mmol/L (ref 135–146)
TOTAL PROTEIN: 6 g/dL — AB (ref 6.1–8.1)
Total Bilirubin: 0.2 mg/dL (ref 0.2–1.2)

## 2018-07-14 LAB — LIPID PANEL
CHOL/HDL RATIO: 3.4 (calc) (ref <5.0)
Cholesterol: 159 mg/dL (ref <200)
HDL: 47 mg/dL — ABNORMAL LOW (ref >50)
LDL Cholesterol (Calc): 83 mg/dL (calc)
NON-HDL CHOLESTEROL (CALC): 112 mg/dL (ref <130)
TRIGLYCERIDES: 192 mg/dL — AB (ref <150)

## 2018-07-14 LAB — HEMOGLOBIN A1C
Hgb A1c MFr Bld: 7 % of total Hgb — ABNORMAL HIGH (ref <5.7)
Mean Plasma Glucose: 154 (calc)
eAG (mmol/L): 8.5 (calc)

## 2018-07-15 DIAGNOSIS — J449 Chronic obstructive pulmonary disease, unspecified: Secondary | ICD-10-CM | POA: Diagnosis not present

## 2018-07-19 ENCOUNTER — Encounter: Payer: Self-pay | Admitting: Internal Medicine

## 2018-07-19 ENCOUNTER — Ambulatory Visit (INDEPENDENT_AMBULATORY_CARE_PROVIDER_SITE_OTHER): Payer: Medicare HMO | Admitting: Internal Medicine

## 2018-07-19 VITALS — BP 138/78 | HR 63 | Temp 97.6°F | Ht 63.0 in | Wt 208.6 lb

## 2018-07-19 DIAGNOSIS — I10 Essential (primary) hypertension: Secondary | ICD-10-CM

## 2018-07-19 DIAGNOSIS — J019 Acute sinusitis, unspecified: Secondary | ICD-10-CM | POA: Diagnosis not present

## 2018-07-19 DIAGNOSIS — E1121 Type 2 diabetes mellitus with diabetic nephropathy: Secondary | ICD-10-CM | POA: Diagnosis not present

## 2018-07-19 DIAGNOSIS — N184 Chronic kidney disease, stage 4 (severe): Secondary | ICD-10-CM

## 2018-07-19 DIAGNOSIS — J449 Chronic obstructive pulmonary disease, unspecified: Secondary | ICD-10-CM | POA: Diagnosis not present

## 2018-07-19 DIAGNOSIS — E1169 Type 2 diabetes mellitus with other specified complication: Secondary | ICD-10-CM | POA: Diagnosis not present

## 2018-07-19 DIAGNOSIS — E785 Hyperlipidemia, unspecified: Secondary | ICD-10-CM

## 2018-07-19 DIAGNOSIS — K5903 Drug induced constipation: Secondary | ICD-10-CM | POA: Diagnosis not present

## 2018-07-19 DIAGNOSIS — I739 Peripheral vascular disease, unspecified: Secondary | ICD-10-CM | POA: Diagnosis not present

## 2018-07-19 DIAGNOSIS — Z794 Long term (current) use of insulin: Secondary | ICD-10-CM

## 2018-07-19 DIAGNOSIS — J302 Other seasonal allergic rhinitis: Secondary | ICD-10-CM

## 2018-07-19 DIAGNOSIS — T402X5A Adverse effect of other opioids, initial encounter: Secondary | ICD-10-CM

## 2018-07-19 MED ORDER — METHYLPREDNISOLONE ACETATE 40 MG/ML IJ SUSP
40.0000 mg | Freq: Once | INTRAMUSCULAR | Status: AC
  Administered 2018-07-19: 40 mg via INTRAMUSCULAR

## 2018-07-19 MED ORDER — LORATADINE 10 MG PO TABS: 10.0000 mg | ORAL_TABLET | Freq: Every day | ORAL | 5 refills | Status: DC

## 2018-07-19 MED ORDER — DOXYCYCLINE HYCLATE 100 MG PO TABS: 100.0000 mg | ORAL_TABLET | Freq: Two times a day (BID) | ORAL | 0 refills | Status: DC

## 2018-07-19 NOTE — Patient Instructions (Addendum)
START DOXYCYCLINE 100MG  2 TIMES DAILY X 10 DAYS FOR SINUS INFECTION  START CLARITIN 10MG  DAILY FOR SEASONAL ALLERGY  DEPO-MEDROL 40MG  INJECTION GIVEN TODAY  Continue other medications as ordered  Follow up with specialists as scheduled  Follow up in 3 mos with Janett Billow for DM, COPD, CKD, PAD. Fasting labs prior to appt   Sinusitis, Adult Sinusitis is soreness and inflammation of your sinuses. Sinuses are hollow spaces in the bones around your face. They are located:  Around your eyes.  In the middle of your forehead.  Behind your nose.  In your cheekbones.  Your sinuses and nasal passages are lined with a stringy fluid (mucus). Mucus normally drains out of your sinuses. When your nasal tissues get inflamed or swollen, the mucus can get trapped or blocked so air cannot flow through your sinuses. This lets bacteria, viruses, and funguses grow, and that leads to infection. Follow these instructions at home: Medicines  Take, use, or apply over-the-counter and prescription medicines only as told by your doctor. These may include nasal sprays.  If you were prescribed an antibiotic medicine, take it as told by your doctor. Do not stop taking the antibiotic even if you start to feel better. Hydrate and Humidify  Drink enough water to keep your pee (urine) clear or pale yellow.  Use a cool mist humidifier to keep the humidity level in your home above 50%.  Breathe in steam for 10-15 minutes, 3-4 times a day or as told by your doctor. You can do this in the bathroom while a hot shower is running.  Try not to spend time in cool or dry air. Rest  Rest as much as possible.  Sleep with your head raised (elevated).  Make sure to get enough sleep each night. General instructions  Put a warm, moist washcloth on your face 3-4 times a day or as told by your doctor. This will help with discomfort.  Wash your hands often with soap and water. If there is no soap and water, use hand  sanitizer.  Do not smoke. Avoid being around people who are smoking (secondhand smoke).  Keep all follow-up visits as told by your doctor. This is important. Contact a doctor if:  You have a fever.  Your symptoms get worse.  Your symptoms do not get better within 10 days. Get help right away if:  You have a very bad headache.  You cannot stop throwing up (vomiting).  You have pain or swelling around your face or eyes.  You have trouble seeing.  You feel confused.  Your neck is stiff.  You have trouble breathing. This information is not intended to replace advice given to you by your health care provider. Make sure you discuss any questions you have with your health care provider. Document Released: 05/04/2008 Document Revised: 07/12/2016 Document Reviewed: 09/11/2015 Elsevier Interactive Patient Education  [employer].

## 2018-07-19 NOTE — Progress Notes (Signed)
Patient ID: Michelle Buck, female   DOB: 09/13/1936, 82 y.o.   MRN: [id number]   Location:  [facility] OFFICE  Provider: DR Arletha Grippe  Code Status:  Goals of Care:  Advanced Directives 07/19/2018  Does Patient Have a Medical Advance Directive? Yes  Type of Advance Directive Out of facility DNR (pink MOST or yellow form)  Does patient want to make changes to medical advance directive? -  Copy of [place] in Chart? -  Would patient like information on creating a medical advance directive? -  Pre-existing out of facility DNR order (yellow form or pink MOST form) Yellow form placed in chart (order not valid for inpatient use)     Chief Complaint  Patient presents with  . Medical Management of Chronic Issues    Pt is being seen for a 3 month routine visit.   . Medication management    Pt states that movantik and hydrocodone are not working for her anymore  . ACP    Has DNR on file  . Audit C Screeing    score of 0    HPI: Patient is a 82 y.o. female seen today for medical management of chronic diseases.  She c/o 3-4 mos hx nasal congestion, HA, generalized arthralgias and intermittent cough. Nothing tried OTC. No known hx seasonal allergy. She does not go outside except for appts. She states her pain med does not work and she reports no BM in several days even after "I took 4 at a time" but does not recall which RX med for constipation she took. She has never seen a pain specialist  She lost her son due to multiple GSW in Oct 2018. She is still grieving. He helped her a lot and now she does not have anyone living with her.  Multiple joint pain - she reports diffuse arthralgias but especially in legs b/l. Pain is shooting. She also c/o pain in left shoulder that began after fall in  Some time ago. She hit her shoulder against faucet and bruised it. She c/o severe pain in left medial foot and unable bare weight on it. She had similar pain in right foot that req'd surgical  correction. She does not feel she needs orthotic and does not feel it will help. She takes norco and uses voltaren gel. Also takes cymbalta and gabapentin. She has fallen since last OV - fall witnessed. She was attempting to climb into vehicle when it occurred. No head trauma nor other injury per pt. She did not seek medical attention  HTN - BP stable on metoprolol, hydralazine. Enalapril and clonidine stopped by nephrology  Hx CHF/hyperlipidemia - stable on statin, diuretic. Takes asa daily. Followed by cardio ; LDL 83; HDL 47  COPD/chronic respiratory failure - currently on duoneb, pulmicort, mucinex dm. Uses HFA TID. Followed by pulmonary. She is O2 dependent and ha CPAP qHS  DM with neuropathy - A1c 7%. Insulin dependent (40 units in AM and 25 at PM). Takes gabapentin. BS fluctuating. FBS 148 today. She has a low BS reaction at times. LDL 83; urine micro/Cr ratio 2  Edema  - stable on demadex and lasix with Kdur  CKD/AOCD - stage 4. Cr 2.2. Hgb 9.9. Followed by nephrology Dr [name] at [facility]  GERD/constipation - stable on nexium. She has emesis with amitiza and miralax but still takes them  PAD - stable on pletal  OAB - stable on flomax  Past Medical History:  Diagnosis Date  .  Acute bronchitis   . Anxiety   . Arthritis   . Chronic kidney disease, stage II (mild)   . Complications affecting other specified body systems, hypertension   . Congestive heart failure, unspecified    stress test in March at [facility]  . COPD (chronic obstructive pulmonary disease) ([place])    present for several years, diagnosed in the last few years  . Degenerative arthritis of knee, bilateral   . Diabetes mellitus   . Diaphragmatic hernia without mention of obstruction or gangrene   . Disorder of bone and cartilage, unspecified   . GERD (gastroesophageal reflux disease)   . Hiatal hernia   . Hyperlipidemia   . Hypertension   . Hypopotassemia   . Insomnia,  unspecified   . Lumbago   . Obese   . Osteoarthrosis, unspecified whether generalized or localized, unspecified site   . PONV (postoperative nausea and vomiting)   . Scoliosis   . Secondary diabetes mellitus with renal manifestations, not stated as uncontrolled, or unspecified(249.40)   . Unspecified glaucoma(365.9)   . Unspecified hereditary and idiopathic peripheral neuropathy     Past Surgical History:  Procedure Laterality Date  . ABDOMINAL HYSTERECTOMY    . COLONOSCOPY  08/06/2011  . ESOPHAGOGASTRODUODENOSCOPY (EGD) WITH PROPOFOL N/A 05/26/2018   Procedure: ESOPHAGOGASTRODUODENOSCOPY (EGD) WITH PROPOFOL;  Surgeon: Milus Banister, MD;  Location: [facility] ENDOSCOPY;  Service: Endoscopy;  Laterality: N/A;  . FOOT SURGERY    . KNEE ARTHROSCOPY     bilateral  . SHOULDER SURGERY    . TOTAL HIP ARTHROPLASTY     bilateral     reports that she quit smoking about 21 years ago. Her smoking use included cigarettes. She has a 1.50 pack-year smoking history. She has never used smokeless tobacco. She reports that she does not drink alcohol or use drugs. Social History   Socioeconomic History  . Marital status: Widowed    Spouse name: Not on file  . Number of children: Not on file  . Years of education: Not on file  . Highest education level: Not on file  Occupational History  . Occupation: Retired    Fish farm manager: UNEMPLOYED  Social Needs  . Financial resource strain: Not very hard  . Food insecurity:    Worry: Never true    Inability: Never true  . Transportation needs:    Medical: No    Non-medical: No  Tobacco Use  . Smoking status: Former Smoker    Packs/day: 0.50    Years: 3.00    Pack years: 1.50    Types: Cigarettes    Last attempt to quit: 02/27/1997    Years since quitting: 21.4  . Smokeless tobacco: Never Used  . Tobacco comment: remote history - stopped 19 years ago; 20 pack year history  Substance and Sexual Activity  . Alcohol use: No    Alcohol/week: 0.0 standard  drinks  . Drug use: No  . Sexual activity: Never  Lifestyle  . Physical activity:    Days per week: 0 days    Minutes per session: 0 min  . Stress: Not at all  Relationships  . Social connections:    Talks on phone: More than three times a week    Gets together: More than three times a week    Attends religious service: Never    Active member of club or organization: No    Attends meetings of clubs or organizations: Never    Relationship status: Widowed  . Intimate partner violence:  Fear of current or ex partner: No    Emotionally abused: No    Physically abused: No    Forced sexual activity: No  Other Topics Concern  . Not on file  Social History Narrative   Widowed   No children   Never held a steady job    Family History  Problem Relation Age of Onset  . Diabetes Mother   . Heart disease Mother   . Heart disease Brother   . Diabetes Brother   . Kidney disease Father   . Scoliosis Sister   . Diabetes Brother   . Diabetes Brother   . Heart disease Brother   . Scoliosis Brother   . Stroke Brother   . Heart attack Brother     Allergies  Allergen Reactions  . Fluticasone-Salmeterol     numbness  . Tramadol Other (See Comments)    Leg cramps   . Codeine Nausea And Vomiting and Other (See Comments)    Patient states N/V with codeine  . Hydrocodone Nausea And Vomiting  . Penicillins Rash and Other (See Comments)    Patient states rash/itch with penicillin Has patient had a PCN reaction causing immediate rash, facial/tongue/throat swelling, SOB or lightheadedness with hypotension: Yes- broke me out and i was itching Has patient had a PCN reaction causing severe rash involving mucus membranes or skin necrosis: No Has patient had a PCN reaction that required hospitalization Yes- i was already in the hospital Has patient had a PCN reaction occurring within the last 10 years: No If all of the above answers are "NO  . Tylenol [Acetaminophen] Hives    Outpatient  Encounter Medications as of 07/19/2018  Medication Sig  . albuterol (PROVENTIL HFA;VENTOLIN HFA) 108 (90 Base) MCG/ACT inhaler Inhale 2 puffs into the lungs every 4 (four) hours as needed for wheezing or shortness of breath.  Marland Kitchen aspirin EC 81 MG tablet Take 81 mg by mouth daily.  . budesonide (PULMICORT) 0.25 MG/2ML nebulizer solution USE 1 AMPULE IN NEBULIZER TWICE DAILY ROUTINELY FOR COPD  . Cholecalciferol (VITAMIN D3) 2000 units TABS Take 2,000 Units by mouth daily.  . cilostazol (PLETAL) 100 MG tablet TAKE 1 TABLET BY MOUTH ONCE DAILY  . cycloSPORINE (RESTASIS) 0.05 % ophthalmic emulsion Place 1 drop into both eyes 2 (two) times daily.  . diclofenac sodium (VOLTAREN) 1 % GEL APPLY 4 GRAMS TOPICALLY TWICE DAILY AS NEEDED FOR PAIN  . DULoxetine (CYMBALTA) 30 MG capsule Take 30 mg by mouth daily.  Marland Kitchen EASY TOUCH INSULIN SYRINGE 31G X 5/16" 0.5 ML MISC USE UP TO 2 TIMES A DAY AS DIRECTED  . esomeprazole (NEXIUM) 40 MG capsule TAKE 1 CAPSULE BY MOUTH ONCE DAILY  . furosemide (LASIX) 40 MG tablet TAKE 1 TABLET BY MOUTH TWICE A DAY  . gabapentin (NEURONTIN) 100 MG capsule TAKE 2 CAPSULES BY MOUTH THREE TIMES A DAY  . gabapentin (NEURONTIN) 300 MG capsule TAKE 1 CAPSULE BY MOUTH TWICE A DAY  . guaifenesin (ROBITUSSIN) 100 MG/5ML syrup Take 200 mg by mouth 3 (three) times daily as needed for cough.  . hydrALAZINE (APRESOLINE) 50 MG tablet TAKE 1 TABLET BY MOUTH THREE TIMES A DAY  . HYDROcodone-acetaminophen (NORCO) 10-325 MG tablet Take 1 tablet by mouth every 6 (six) hours as needed.  . insulin aspart protamine- aspart (NOVOLOG MIX 70/30) (70-30) 100 UNIT/ML injection Inject 40 units subcutaneously with breakfast and inject 10 units subcutaneously with dinner to control blood sugar  . ipratropium-albuterol (DUONEB) 0.5-2.5 (3) MG/3ML  SOLN Take 3 mLs by nebulization 2 (two) times daily. Dx: J44.1  . lubiprostone (AMITIZA) 24 MCG capsule Take 24 mcg by mouth 2 (two) times daily.  . metoprolol succinate  (TOPROL-XL) 25 MG 24 hr tablet TAKE 1 TABLET BY MOUTH ONCE DAILY WITH OR IMMEDIATELY FOLLOWING A MEAL FOR BLOOD PRESSURE  . Multiple Vitamin (MULTIVITAMIN) tablet Take 1 tablet by mouth daily.  . naloxegol oxalate (MOVANTIK) 12.5 MG TABS tablet Take 1 tablet (12.5 mg total) by mouth daily.  . ondansetron (ZOFRAN ODT) 8 MG disintegrating tablet Take 1 tablet (8 mg total) by mouth every 8 (eight) hours as needed for nausea or vomiting.  . potassium chloride SA (K-DUR,KLOR-CON) 20 MEQ tablet TAKE 1 TABLET BY MOUTH ONCE DAILY  . simvastatin (ZOCOR) 20 MG tablet TAKE 1 TABLET BY MOUTH ONCE DAILY  . sucralfate (CARAFATE) 1 g tablet TAKE 1 TABLET BY MOUTH TWICE DAILY ON EMPTY STOMACH  . tamsulosin (FLOMAX) 0.4 MG CAPS capsule Take 1 capsule (0.4 mg total) by mouth daily.  . timolol (TIMOPTIC) 0.5 % ophthalmic solution INSTILL 1 DROP INTO EACH EYE TWICE A DAY  . TRAVATAN Z 0.004 % SOLN ophthalmic solution Place 1 drop into both eyes at bedtime.    No facility-administered encounter medications on file as of 07/19/2018.     Review of Systems:  Review of Systems  Constitutional: Positive for fatigue.  HENT: Positive for congestion and sinus pressure.   Musculoskeletal: Positive for arthralgias and gait problem.  Neurological: Positive for dizziness and headaches.  All other systems reviewed and are negative.   Health Maintenance  Topic Date Due  . INFLUENZA VACCINE  08/29/2018 (Originally 06/30/2018)  . TETANUS/TDAP  11/30/2018 (Originally 06/06/1955)  . FOOT EXAM  01/12/2019  . HEMOGLOBIN A1C  01/13/2019  . OPHTHALMOLOGY EXAM  06/21/2019  . DEXA SCAN  Completed  . PNA vac Low Risk Adult  Completed    Physical Exam: Vitals:   07/19/18 0852  BP: 138/78  Pulse: 63  Temp: 97.6 F (36.4 C)  TempSrc: Oral  SpO2: 95%  Weight: 208 lb 9.6 oz (94.6 kg)  Height: _0  (1.6 m)   Body mass index is 36.95 kg/m. Physical Exam  Constitutional: She is oriented to person, place, and time. She  appears well-developed and well-nourished.  HENT:  Mouth/Throat: Oropharynx is clear and moist. No oropharyngeal exudate.  Frontal and maxillary sinus TTP with boggy tissue texture changes; oropharynx cobblestoning and redness but no exudate; MMM; no oral thrush  Eyes: Pupils are equal, round, and reactive to light. No scleral icterus.  Neck: Neck supple. Carotid bruit is not present. No tracheal deviation present. No thyromegaly present.  Cardiovascular: Normal rate, regular rhythm and intact distal pulses. Exam reveals no gallop and no friction rub.  Murmur (1/6 SEM) heard. +1 pitting LE edema b/l. No calf TTP  Pulmonary/Chest: Effort normal and breath sounds normal. No stridor. No respiratory distress. She has no wheezes. She has no rales.  Abdominal: Soft. Normal appearance and bowel sounds are normal. She exhibits distension. She exhibits no mass. There is no hepatomegaly. There is no tenderness. There is no rigidity, no rebound and no guarding. No hernia.  obese  Musculoskeletal: She exhibits edema (small and large joints).  Lymphadenopathy:    She has cervical adenopathy (L>R).  Neurological: She is alert and oriented to person, place, and time. She has normal reflexes.  Skin: Skin is warm and dry. No rash noted.  Psychiatric: She has a normal mood and  affect. Her behavior is normal. Thought content normal.    Labs reviewed: Basic Metabolic Panel: Recent Labs    12/27/17 1045 03/24/18 0806 07/13/18 0807  NA 145 143 139  K 3.4* 3.7 3.7  CL 106 106 97*  CO2 28 25 32  GLUCOSE 100 123* 171*  BUN 27* 24 22  CREATININE 2.37* 2.25* 2.20*  CALCIUM 9.0 9.3 9.0   Liver Function Tests: Recent Labs    12/27/17 1045 03/24/18 0806 07/13/18 0807  AST _0 ALT _1 BILITOT 0.2 0.3 0.2  PROT 6.7 6.9 6.0*   No results for input(s): LIPASE, AMYLASE in the last 8760 hours. No results for input(s): AMMONIA in the last 8760 hours. CBC: Recent Labs    07/13/18 0807  WBC  6.0  NEUTROABS 2,838  HGB 9.9*  HCT 30.9*  MCV 84.7  PLT 232   Lipid Panel: Recent Labs    11/24/17 0923 07/13/18 0807  CHOL 164 159  HDL 64 47*  LDLCALC 75 83  TRIG 146 192*  CHOLHDL 2.6 3.4   Lab Results  Component Value Date   HGBA1C 7.0 (H) 07/13/2018    Procedures since last visit: No results found.  Assessment/Plan   ICD-10-CM   1. Acute sinusitis, recurrence not specified, unspecified location J01.90 doxycycline (VIBRA-TABS) 100 MG tablet    methylPREDNISolone acetate (DEPO-MEDROL) injection 40 mg  2. Seasonal allergic rhinitis, unspecified trigger J30.2 loratadine (CLARITIN) 10 MG tablet  3. Chronic obstructive pulmonary disease, unspecified COPD type ([place]) J44.9   4. Type 2 diabetes mellitus with diabetic nephropathy, with long-term current use of insulin (HCC) E11.21 BMP with eGFR([facility])   Z79.4 ALT    Hemoglobin A1c  5. Constipation due to opioid therapy K59.03    T40.2X5A   6. Essential hypertension, benign I10   7. Chronic kidney disease (CKD), stage IV (severe) (HCC) N18.4   8. PAD (peripheral artery disease) (HCC) I73.9   9. Hyperlipidemia associated with type 2 diabetes mellitus (HCC) E11.69 Lipid Panel   E78.5      START DOXYCYCLINE 100MG 2 TIMES DAILY X 10 DAYS FOR SINUS INFECTION  START CLARITIN 10MG DAILY FOR SEASONAL ALLERGY  DEPO-MEDROL 40MG INJECTION GIVEN TODAY  Continue other medications as ordered  T/c pain management referral if pain does not improve with treating sinusitis  Follow up with specialists as scheduled  Follow up in 3 mos with Michelle Buck for DM, COPD, CKD, PAD. Fasting labs prior to appt   Brawley S. Perlie Gold  [facility] [address] [place], [place] [address] [phone] Cell (Monday-Friday 8 AM - 5 PM) [phone] After 5 PM and follow prompts

## 2018-07-22 DIAGNOSIS — J449 Chronic obstructive pulmonary disease, unspecified: Secondary | ICD-10-CM | POA: Diagnosis not present

## 2018-07-27 ENCOUNTER — Other Ambulatory Visit: Payer: Self-pay

## 2018-07-27 DIAGNOSIS — E785 Hyperlipidemia, unspecified: Secondary | ICD-10-CM

## 2018-07-27 DIAGNOSIS — Z794 Long term (current) use of insulin: Secondary | ICD-10-CM

## 2018-07-27 DIAGNOSIS — E1121 Type 2 diabetes mellitus with diabetic nephropathy: Secondary | ICD-10-CM

## 2018-07-27 DIAGNOSIS — E1169 Type 2 diabetes mellitus with other specified complication: Secondary | ICD-10-CM

## 2018-07-28 ENCOUNTER — Other Ambulatory Visit: Payer: Self-pay | Admitting: *Deleted

## 2018-07-28 DIAGNOSIS — M545 Low back pain, unspecified: Secondary | ICD-10-CM

## 2018-07-28 DIAGNOSIS — G8929 Other chronic pain: Secondary | ICD-10-CM

## 2018-07-28 MED ORDER — HYDROCODONE-ACETAMINOPHEN 10-325 MG PO TABS: 1.0000 | ORAL_TABLET | Freq: Four times a day (QID) | ORAL | 0 refills | Status: DC | PRN

## 2018-07-28 NOTE — Telephone Encounter (Signed)
Called and spoke with patient. Patient stated that she has been taking Hydrocodone for the last 3 years and she is not allergic to this. Stated that previous medication before this one she could not take. Stated that she tolerates this one well and needs refill. Stated again she has been taking for 3 years.

## 2018-07-28 NOTE — Telephone Encounter (Signed)
Patient requested Refill [place] Verified LR: 06/27/2018 Pharmacy Confirmed Pended Rx and sent to [facility] for approval. (Jessica's Patient)

## 2018-07-28 NOTE — Telephone Encounter (Signed)
Patient has allergies listed as allergic to tylenol and Hydrocodone. Please clarify with patient allergies and update.

## 2018-07-28 NOTE — Telephone Encounter (Signed)
D/c Tylenol and Hydrocodone from allergies list per patient.

## 2018-08-04 DIAGNOSIS — R69 Illness, unspecified: Secondary | ICD-10-CM | POA: Diagnosis not present

## 2018-08-05 ENCOUNTER — Other Ambulatory Visit: Payer: Self-pay | Admitting: Nurse Practitioner

## 2018-08-05 DIAGNOSIS — K5903 Drug induced constipation: Secondary | ICD-10-CM

## 2018-08-05 DIAGNOSIS — T402X5A Adverse effect of other opioids, initial encounter: Principal | ICD-10-CM

## 2018-08-11 DIAGNOSIS — G4733 Obstructive sleep apnea (adult) (pediatric): Secondary | ICD-10-CM | POA: Diagnosis not present

## 2018-08-15 DIAGNOSIS — J449 Chronic obstructive pulmonary disease, unspecified: Secondary | ICD-10-CM | POA: Diagnosis not present

## 2018-08-22 DIAGNOSIS — J449 Chronic obstructive pulmonary disease, unspecified: Secondary | ICD-10-CM | POA: Diagnosis not present

## 2018-08-26 ENCOUNTER — Other Ambulatory Visit: Payer: Self-pay | Admitting: *Deleted

## 2018-08-26 DIAGNOSIS — M545 Low back pain: Principal | ICD-10-CM

## 2018-08-26 DIAGNOSIS — G8929 Other chronic pain: Secondary | ICD-10-CM

## 2018-08-26 NOTE — Telephone Encounter (Signed)
Patient requested refill [facility] Verified LR: 07/28/2018 Pharmacy Confirmed Pended Rx and sent to Dr. Eulas Post for approval.

## 2018-08-28 MED ORDER — HYDROCODONE-ACETAMINOPHEN 10-325 MG PO TABS: 1.0000 | ORAL_TABLET | Freq: Four times a day (QID) | ORAL | 0 refills | Status: DC | PRN

## 2018-08-29 DIAGNOSIS — R69 Illness, unspecified: Secondary | ICD-10-CM | POA: Diagnosis not present

## 2018-09-02 ENCOUNTER — Other Ambulatory Visit: Payer: Self-pay | Admitting: Internal Medicine

## 2018-09-12 DIAGNOSIS — G4733 Obstructive sleep apnea (adult) (pediatric): Secondary | ICD-10-CM | POA: Diagnosis not present

## 2018-09-14 DIAGNOSIS — J449 Chronic obstructive pulmonary disease, unspecified: Secondary | ICD-10-CM | POA: Diagnosis not present

## 2018-09-21 DIAGNOSIS — J449 Chronic obstructive pulmonary disease, unspecified: Secondary | ICD-10-CM | POA: Diagnosis not present

## 2018-09-26 ENCOUNTER — Encounter (HOSPITAL_COMMUNITY): Payer: Self-pay | Admitting: *Deleted

## 2018-09-26 ENCOUNTER — Emergency Department (HOSPITAL_COMMUNITY)
Admission: EM | Admit: 2018-09-26 | Discharge: 2018-09-26 | Disposition: A | Discharge status: Home / Self Care | Payer: Medicare HMO | Attending: Emergency Medicine | Admitting: Emergency Medicine

## 2018-09-26 ENCOUNTER — Emergency Department (HOSPITAL_COMMUNITY): Payer: Medicare HMO

## 2018-09-26 DIAGNOSIS — M542 Cervicalgia: Secondary | ICD-10-CM | POA: Diagnosis not present

## 2018-09-26 DIAGNOSIS — I5030 Unspecified diastolic (congestive) heart failure: Secondary | ICD-10-CM | POA: Diagnosis not present

## 2018-09-26 DIAGNOSIS — E1122 Type 2 diabetes mellitus with diabetic chronic kidney disease: Secondary | ICD-10-CM | POA: Diagnosis not present

## 2018-09-26 DIAGNOSIS — N183 Chronic kidney disease, stage 3 (moderate): Secondary | ICD-10-CM | POA: Insufficient documentation

## 2018-09-26 DIAGNOSIS — I130 Hypertensive heart and chronic kidney disease with heart failure and stage 1 through stage 4 chronic kidney disease, or unspecified chronic kidney disease: Secondary | ICD-10-CM | POA: Diagnosis not present

## 2018-09-26 DIAGNOSIS — I4891 Unspecified atrial fibrillation: Secondary | ICD-10-CM | POA: Diagnosis not present

## 2018-09-26 DIAGNOSIS — M47812 Spondylosis without myelopathy or radiculopathy, cervical region: Secondary | ICD-10-CM

## 2018-09-26 DIAGNOSIS — R69 Illness, unspecified: Secondary | ICD-10-CM | POA: Diagnosis not present

## 2018-09-26 DIAGNOSIS — Z87891 Personal history of nicotine dependence: Secondary | ICD-10-CM | POA: Diagnosis not present

## 2018-09-26 DIAGNOSIS — G4489 Other headache syndrome: Secondary | ICD-10-CM | POA: Diagnosis not present

## 2018-09-26 DIAGNOSIS — Z79899 Other long term (current) drug therapy: Secondary | ICD-10-CM | POA: Insufficient documentation

## 2018-09-26 DIAGNOSIS — Z7982 Long term (current) use of aspirin: Secondary | ICD-10-CM | POA: Insufficient documentation

## 2018-09-26 DIAGNOSIS — J449 Chronic obstructive pulmonary disease, unspecified: Secondary | ICD-10-CM | POA: Insufficient documentation

## 2018-09-26 DIAGNOSIS — Z96643 Presence of artificial hip joint, bilateral: Secondary | ICD-10-CM | POA: Diagnosis not present

## 2018-09-26 DIAGNOSIS — R52 Pain, unspecified: Secondary | ICD-10-CM | POA: Diagnosis not present

## 2018-09-26 DIAGNOSIS — I10 Essential (primary) hypertension: Secondary | ICD-10-CM | POA: Diagnosis not present

## 2018-09-26 LAB — CBG MONITORING, ED: GLUCOSE-CAPILLARY: 201 mg/dL — AB (ref 70–99)

## 2018-09-26 MED ORDER — TRAMADOL HCL 50 MG PO TABS: 50.0000 mg | ORAL_TABLET | Freq: Two times a day (BID) | ORAL | 0 refills | Status: DC | PRN

## 2018-09-26 MED ORDER — TRAMADOL HCL 50 MG PO TABS
50.0000 mg | ORAL_TABLET | Freq: Once | ORAL | Status: AC
  Administered 2018-09-26: 50 mg via ORAL
  Filled 2018-09-26: qty 1

## 2018-09-26 NOTE — ED Provider Notes (Signed)
[facility] EMERGENCY DEPARTMENT Provider Note   CSN: [id number] Arrival date & time: 09/26/18  1444     History   Chief Complaint Chief Complaint  Patient presents with  . Neck Pain    HPI Michelle Buck is a 82 y.o. female.  HPI   She presents for evaluation of bilateral neck pain left greater than right which radiates to her lower anterior neck.  Pain is been present for several days and worsening.  She saw her PCP about 8 days ago was diagnosed with sinusitis, treated with IM Depo-Medrol and Claritin.  Denies recent trauma.  She denies fever, chills, nausea, vomiting, chest pain or shortness of breath.  She is taking her usual medications.  She states that when she takes Tylenol she gets nauseous.  There are no other known modifying factors.  Past Medical History:  Diagnosis Date  . Acute bronchitis   . Anxiety   . Arthritis   . Chronic kidney disease, stage II (mild)   . Complications affecting other specified body systems, hypertension   . Congestive heart failure, unspecified    stress test in March at [facility]  . COPD (chronic obstructive pulmonary disease) ([place])    present for several years, diagnosed in the last few years  . Degenerative arthritis of knee, bilateral   . Diabetes mellitus   . Diaphragmatic hernia without mention of obstruction or gangrene   . Disorder of bone and cartilage, unspecified   . GERD (gastroesophageal reflux disease)   . Hiatal hernia   . Hyperlipidemia   . Hypertension   . Hypopotassemia   . Insomnia, unspecified   . Lumbago   . Obese   . Osteoarthrosis, unspecified whether generalized or localized, unspecified site   . PONV (postoperative nausea and vomiting)   . Scoliosis   . Secondary diabetes mellitus with renal manifestations, not stated as uncontrolled, or unspecified(249.40)   . Unspecified glaucoma(365.9)   . Unspecified hereditary and idiopathic peripheral neuropathy     Patient Active  Problem List   Diagnosis Date Noted  . Hiatal hernia   . Poor dentition   . Dysphagia 05/05/2018  . Hypercapnia 05/06/2017  . Osteoarthritis 03/25/2017  . Shoulder pain 03/25/2017  . Abnormality of gait 03/25/2017  . COPD (chronic obstructive pulmonary disease) ([place]) 09/25/2016  . Diastolic CHF ([place]) 25/03/3975  . Chest pressure 09/25/2016  . Dyspnea and respiratory abnormality 09/23/2016  . OSA (obstructive sleep apnea) 09/02/2016  . Other fatigue 06/23/2016  . PAD (peripheral artery disease) ([place]) 05/21/2016  . Glaucoma 05/21/2016  . Bilateral edema of lower extremity   . Leg edema 01/01/2016  . CKD stage 3 due to type 2 diabetes mellitus ([place])   . CHF (congestive heart failure) ([place]) 10/29/2015  . Chronic obstructive pulmonary disease ([place]) 10/29/2015  . Lumbago   . CKD (chronic kidney disease)   . Chronic kidney disease (CKD), stage IV (severe) ([place]) 09/11/2015  . Diabetes mellitus with diabetic nephropathy, with long-term current use of insulin ([place]) 09/11/2015  . Anemia of chronic kidney failure 09/11/2015  . Morbid obesity ([place]) 08/14/2015  . Peripheral autonomic neuropathy due to diabetes mellitus ([place]) 10/24/2013  . Essential hypertension, benign 02/27/2013  . Acute on chronic diastolic congestive heart failure ([place]) 03/02/2012  . Diabetes mellitus type 2 in obese ([place]) 03/02/2012    Past Surgical History:  Procedure Laterality Date  . ABDOMINAL HYSTERECTOMY    . COLONOSCOPY  08/06/2011  . ESOPHAGOGASTRODUODENOSCOPY (EGD) WITH PROPOFOL N/A 05/26/2018  Procedure: ESOPHAGOGASTRODUODENOSCOPY (EGD) WITH PROPOFOL;  Surgeon: Milus Banister, MD;  Location: [facility];  Service: Endoscopy;  Laterality: N/A;  . FOOT SURGERY    . KNEE ARTHROSCOPY     bilateral  . SHOULDER SURGERY    . TOTAL HIP ARTHROPLASTY     bilateral     OB History   None      Home Medications    Prior to Admission medications   Medication Sig Start Date End Date Taking? Authorizing  Provider  albuterol (PROVENTIL HFA;VENTOLIN HFA) 108 (90 Base) MCG/ACT inhaler Inhale 2 puffs into the lungs every 4 (four) hours as needed for wheezing or shortness of breath. 11/24/17   [name], NP  aspirin EC 81 MG tablet Take 81 mg by mouth daily.    [provider]  budesonide (PULMICORT) 0.25 MG/2ML nebulizer solution USE 1 AMPULE IN NEBULIZER TWICE DAILY ROUTINELY FOR COPD 05/13/18   Tanda Rockers, MD  Cholecalciferol (VITAMIN D3) 2000 units TABS Take 2,000 Units by mouth daily.    [provider]  cilostazol (PLETAL) 100 MG tablet TAKE 1 TABLET BY MOUTH ONCE DAILY 09/05/18   [name], NP  cycloSPORINE (RESTASIS) 0.05 % ophthalmic emulsion Place 1 drop into both eyes 2 (two) times daily.    [provider]  diclofenac sodium (VOLTAREN) 1 % GEL APPLY 4 GRAMS TOPICALLY TWICE DAILY AS NEEDED FOR PAIN 11/01/17   [name], NP  doxycycline (VIBRA-TABS) 100 MG tablet Take 1 tablet (100 mg total) by mouth 2 (two) times daily. For sinus infection 07/19/18   Gildardo Cranker, DO  DULoxetine (CYMBALTA) 30 MG capsule TAKE 1 CAPSULE BY MOUTH EVERY DAY TO HELP ANXIETY AND PAINS. 09/05/18   [name], NP  EASY TOUCH INSULIN SYRINGE 31G X 5/16" 0.5 ML MISC USE UP TO 2 TIMES A DAY AS DIRECTED 06/20/18   [name], NP  esomeprazole (NEXIUM) 40 MG capsule TAKE 1 CAPSULE BY MOUTH ONCE DAILY 09/05/18   [name], NP  furosemide (LASIX) 40 MG tablet TAKE 1 TABLET BY MOUTH TWICE A DAY 06/22/18   [name], NP  gabapentin (NEURONTIN) 100 MG capsule TAKE 2 CAPSULES BY MOUTH THREE TIMES A DAY 02/21/18   [name], NP  gabapentin (NEURONTIN) 300 MG capsule TAKE 1 CAPSULE BY MOUTH TWICE A DAY 07/04/18   [name], NP  guaifenesin (ROBITUSSIN) 100 MG/5ML syrup Take 200 mg by mouth 3 (three) times daily as needed for cough.    [provider]  hydrALAZINE (APRESOLINE) 50 MG tablet TAKE 1 TABLET BY MOUTH THREE TIMES  A DAY 08/08/18   [name], NP  HYDROcodone-acetaminophen (NORCO) 10-325 MG tablet Take 1 tablet by mouth every 6 (six) hours as needed. 08/28/18   Gildardo Cranker, DO  insulin aspart protamine- aspart (NOVOLOG MIX 70/30) (70-30) 100 UNIT/ML injection Inject 40 units subcutaneously with breakfast and inject 10 units subcutaneously with dinner to control blood sugar 03/28/18   Reed, Tiffany L, DO  ipratropium-albuterol (DUONEB) 0.5-2.5 (3) MG/3ML SOLN Take 3 mLs by nebulization 2 (two) times daily. Dx: J44.1 02/02/18   Parrett, Fonnie Mu, NP  loratadine (CLARITIN) 10 MG tablet Take 1 tablet (10 mg total) by mouth daily. For seasonal allergy 07/19/18   Gildardo Cranker, DO  lubiprostone (AMITIZA) 24 MCG capsule Take 24 mcg by mouth 2 (two) times daily.    [provider]  metoprolol succinate (TOPROL-XL) 25 MG 24 hr tablet TAKE  1 TABLET BY MOUTH ONCE DAILY WITH OR IMMEDIATELY FOLLOWING A MEAL FOR BLOOD PRESSURE 09/05/18   [name], NP  MOVANTIK 12.5 MG TABS tablet TAKE 1 TABLET BY MOUTH EVERY DAY 08/08/18   [name], NP  Multiple Vitamin (MULTIVITAMIN) tablet Take 1 tablet by mouth daily.    [provider]  ondansetron (ZOFRAN ODT) 8 MG disintegrating tablet Take 1 tablet (8 mg total) by mouth every 8 (eight) hours as needed for nausea or vomiting. 05/03/17   Lajean Saver, MD  potassium chloride SA (K-DUR,KLOR-CON) 20 MEQ tablet TAKE 1 TABLET BY MOUTH ONCE DAILY 06/20/18   [name], NP  simvastatin (ZOCOR) 20 MG tablet TAKE 1 TABLET BY MOUTH ONCE DAILY 09/05/18   [name], NP  sucralfate (CARAFATE) 1 g tablet TAKE 1 TABLET BY MOUTH TWICE DAILY ON EMPTY STOMACH 09/05/18   [name], NP  tamsulosin (FLOMAX) 0.4 MG CAPS capsule Take 1 capsule (0.4 mg total) by mouth daily. 05/03/17   Lajean Saver, MD  timolol (TIMOPTIC) 0.5 % ophthalmic solution INSTILL 1 DROP INTO EACH EYE TWICE A DAY 02/12/16   [name], NP  traMADol (ULTRAM) 50 MG tablet  Take 1 tablet (50 mg total) by mouth every 12 (twelve) hours as needed for moderate pain. 09/26/18   Daleen Bo, MD  TRAVATAN Z 0.004 % SOLN ophthalmic solution Place 1 drop into both eyes at bedtime.  04/09/16   [provider]    Family History Family History  Problem Relation Age of Onset  . Diabetes Mother   . Heart disease Mother   . Heart disease Brother   . Diabetes Brother   . Kidney disease Father   . Scoliosis Sister   . Diabetes Brother   . Diabetes Brother   . Heart disease Brother   . Scoliosis Brother   . Stroke Brother   . Heart attack Brother     Social History Social History   Tobacco Use  . Smoking status: Former Smoker    Packs/day: 0.50    Years: 3.00    Pack years: 1.50    Types: Cigarettes    Last attempt to quit: 02/27/1997    Years since quitting: 21.5  . Smokeless tobacco: Never Used  . Tobacco comment: remote history - stopped 19 years ago; 20 pack year history  Substance Use Topics  . Alcohol use: No    Alcohol/week: 0.0 standard drinks  . Drug use: No     Allergies   Fluticasone-salmeterol; Tramadol; Codeine; Hydrocodone; Penicillins; and Tylenol [acetaminophen]   Review of Systems Review of Systems  All other systems reviewed and are negative.    Physical Exam Updated Vital Signs BP (!) 148/92   Pulse 88   Temp 98.6 F (37 C) (Oral)   Resp 16   SpO2 99%   Physical Exam  Constitutional: She is oriented to person, place, and time. She appears well-developed. No distress.  Elderly, obese  HENT:  Head: Normocephalic and atraumatic.  Eyes: Pupils are equal, round, and reactive to light. Conjunctivae and EOM are normal.  Neck: Normal range of motion and phonation normal. Neck supple.  Cardiovascular: Normal rate and regular rhythm.  Pulmonary/Chest: Effort normal and breath sounds normal. She exhibits no tenderness.  Abdominal: Soft. She exhibits no distension. There is no tenderness. There is no guarding.    Musculoskeletal: Normal range of motion. She exhibits no tenderness or deformity.  She has difficulty flexing shoulders above 45  degrees secondary to bilateral shoulder pain.  Bilateral lower posterior cervical tenderness, left greater than right.  Diminished flexion and rotation neck secondary to pain.  Neurological: She is alert and oriented to person, place, and time. She exhibits normal muscle tone.  Skin: Skin is warm and dry.  Psychiatric: She has a normal mood and affect. Her behavior is normal. Judgment and thought content normal.  Nursing note and vitals reviewed.    ED Treatments / Results  Labs (all labs ordered are listed, but only abnormal results are displayed) Labs Reviewed  CBG MONITORING, ED - Abnormal; Notable for the following components:      Result Value   Glucose-Capillary 201 (*)    All other components within normal limits    EKG None  Radiology Dg Cervical Spine Complete  Result Date: 09/26/2018 CLINICAL DATA:  Generalized neck pain x4 days. EXAM: CERVICAL SPINE - COMPLETE 4+ VIEW COMPARISON:  None FINDINGS: Maintained cervical lordosis. Marked disc flattening C4 through C6 with moderate disc flattening C2-3, C3-4 and C6-7. Facet arthropathy is noted bilaterally. Mild right-sided C3-4 foraminal encroachment from osteophytes. Mild left-sided C3-4, C4-5 and C5-6 foraminal encroachment from osteophytes. Uncovertebral joint osteoarthritis is seen from C3 through C7 bilaterally. No acute cervical spine fracture. No prevertebral soft tissue swelling. The patient is edentulous. Lung apices demonstrate no acute abnormalities. IMPRESSION: Moderate-to-marked degenerative disc disease along the entirety of the cervical spine as described above with bilateral facet arthrosis but without acute fracture or subluxation. Electronically Signed   By: Ashley Royalty M.D.   On: 09/26/2018 15:53    Procedures Procedures (including critical care time)  Medications Ordered in  ED Medications  traMADol (ULTRAM) tablet 50 mg (50 mg Oral Given 09/26/18 2020)     Initial Impression / Assessment and Plan / ED Course  I have reviewed the triage vital signs and the nursing notes.  Pertinent labs & imaging results that were available during my care of the patient were reviewed by me and considered in my medical decision making (see chart for details).      Patient Vitals for the past 24 hrs:  BP Temp Temp src Pulse Resp SpO2  09/26/18 2039 (!) 148/92 - - 88 16 99 %  09/26/18 1929 (!) 152/100 98.6 F (37 C) Oral 68 18 100 %  09/26/18 1725 (!) 159/73 98.6 F (37 C) Oral 80 18 97 %  09/26/18 1453 (!) 178/78 - - 80 16 96 %    8:35 PM Reevaluation with update and discussion. After initial assessment and treatment, an updated evaluation reveals no change in status, findings discussed and questions answered. Daleen Bo   Medical Decision Making: Neck pain, likely musculoskeletal associated with degenerative changes of the cervical spine.  Doubt myelopathy or fracture.  No indication for further ED treatment or hospitalization at this time.  CRITICAL CARE-no Performed by: Daleen Bo  Nursing Notes Reviewed/ Care Coordinated Applicable Imaging Reviewed Interpretation of Laboratory Data incorporated into ED treatment  The patient appears reasonably screened and/or stabilized for discharge and I doubt any other medical condition or other [facility] requiring further screening, evaluation, or treatment in the ED at this time prior to discharge.  Plan: Home Medications-continue usual medication use Tylenol for pain; Home Treatments-rest, heat; return here if the recommended treatment, does not improve the symptoms; Recommended follow up- PCP, PRN   Final Clinical Impressions(s) / ED Diagnoses   Final diagnoses:  Neck pain  Spondylosis of cervical region without myelopathy or radiculopathy  ED Discharge Orders         Ordered    traMADol (ULTRAM) 50 MG tablet   Every 12 hours PRN     09/26/18 2038           Daleen Bo, MD 09/26/18 2040

## 2018-09-26 NOTE — Discharge Instructions (Addendum)
The pain in your neck is likely from arthritis in your lower cervical spine.  Try using heat on the sore area 3 or 4 times a day.  The best pain medicine for this type of problem is Tylenol taken every 4 hours.  We are giving her prescription for a few narcotic pain relievers to use twice a day if needed.  Make sure you follow-up with your primary care doctor for further evaluation and treatment.

## 2018-09-26 NOTE — ED Triage Notes (Signed)
Pt in via EMS to triage c/o neck pain that started Saturday morning, pain worse with movement and feels stiff, no relief with ice or heat at home, area is tender to touch, no distress noted

## 2018-09-26 NOTE — ED Notes (Signed)
Pt was asking for something to eat.  States she is diabetic and concerned about blood sugar.  Checked CBG which was 201.

## 2018-09-29 ENCOUNTER — Other Ambulatory Visit: Payer: Self-pay | Admitting: *Deleted

## 2018-09-29 DIAGNOSIS — M545 Low back pain, unspecified: Secondary | ICD-10-CM

## 2018-09-29 DIAGNOSIS — G8929 Other chronic pain: Secondary | ICD-10-CM

## 2018-09-29 MED ORDER — HYDROCODONE-ACETAMINOPHEN 10-325 MG PO TABS: 1.0000 | ORAL_TABLET | Freq: Four times a day (QID) | ORAL | 0 refills | Status: DC | PRN

## 2018-09-29 NOTE — Telephone Encounter (Signed)
Patient requested refill [place] Verified LR: 08/28/2018 Pended Rx and sent to [place] For approval.

## 2018-09-30 ENCOUNTER — Other Ambulatory Visit: Payer: Self-pay | Admitting: Nurse Practitioner

## 2018-09-30 DIAGNOSIS — K5903 Drug induced constipation: Secondary | ICD-10-CM

## 2018-09-30 DIAGNOSIS — T402X5A Adverse effect of other opioids, initial encounter: Principal | ICD-10-CM

## 2018-10-14 DIAGNOSIS — G4733 Obstructive sleep apnea (adult) (pediatric): Secondary | ICD-10-CM | POA: Diagnosis not present

## 2018-10-15 DIAGNOSIS — J449 Chronic obstructive pulmonary disease, unspecified: Secondary | ICD-10-CM | POA: Diagnosis not present

## 2018-10-17 ENCOUNTER — Other Ambulatory Visit: Payer: Medicare HMO

## 2018-10-17 DIAGNOSIS — Z794 Long term (current) use of insulin: Secondary | ICD-10-CM | POA: Diagnosis not present

## 2018-10-17 DIAGNOSIS — E1121 Type 2 diabetes mellitus with diabetic nephropathy: Secondary | ICD-10-CM | POA: Diagnosis not present

## 2018-10-17 DIAGNOSIS — E1169 Type 2 diabetes mellitus with other specified complication: Secondary | ICD-10-CM | POA: Diagnosis not present

## 2018-10-17 DIAGNOSIS — E785 Hyperlipidemia, unspecified: Secondary | ICD-10-CM

## 2018-10-18 LAB — BASIC METABOLIC PANEL WITH GFR
BUN/Creatinine Ratio: 9 (calc) (ref 6–22)
BUN: 25 mg/dL (ref 7–25)
CALCIUM: 8.4 mg/dL — AB (ref 8.6–10.4)
CO2: 27 mmol/L (ref 20–32)
Chloride: 101 mmol/L (ref 98–110)
Creat: 2.68 mg/dL — ABNORMAL HIGH (ref 0.60–0.88)
GFR, EST AFRICAN AMERICAN: 19 mL/min/{1.73_m2} — AB (ref >=60)
GFR, Est Non African American: 16 mL/min/{1.73_m2} — ABNORMAL LOW (ref >=60)
GLUCOSE: 193 mg/dL — AB (ref 65–99)
Potassium: 2.8 mmol/L — ABNORMAL LOW (ref 3.5–5.3)
SODIUM: 141 mmol/L (ref 135–146)

## 2018-10-18 LAB — LIPID PANEL
Cholesterol: 169 mg/dL (ref <200)
HDL: 43 mg/dL — ABNORMAL LOW (ref >50)
LDL CHOLESTEROL (CALC): 92 mg/dL
NON-HDL CHOLESTEROL (CALC): 126 mg/dL (ref <130)
TRIGLYCERIDES: 242 mg/dL — AB (ref <150)
Total CHOL/HDL Ratio: 3.9 (calc) (ref <5.0)

## 2018-10-18 LAB — HEMOGLOBIN A1C
Hgb A1c MFr Bld: 7.3 % of total Hgb — ABNORMAL HIGH (ref <5.7)
MEAN PLASMA GLUCOSE: 163 (calc)
eAG (mmol/L): 9 (calc)

## 2018-10-18 LAB — ALT: ALT: 9 U/L (ref 6–29)

## 2018-10-21 ENCOUNTER — Other Ambulatory Visit: Payer: Self-pay | Admitting: Nurse Practitioner

## 2018-10-21 ENCOUNTER — Ambulatory Visit (INDEPENDENT_AMBULATORY_CARE_PROVIDER_SITE_OTHER): Payer: Medicare HMO | Admitting: Nurse Practitioner

## 2018-10-21 ENCOUNTER — Other Ambulatory Visit: Payer: Self-pay | Admitting: *Deleted

## 2018-10-21 ENCOUNTER — Encounter: Payer: Self-pay | Admitting: Nurse Practitioner

## 2018-10-21 VITALS — BP 138/84 | HR 67 | Temp 97.9°F | Ht 63.0 in | Wt 211.0 lb

## 2018-10-21 DIAGNOSIS — I10 Essential (primary) hypertension: Secondary | ICD-10-CM

## 2018-10-21 DIAGNOSIS — K5903 Drug induced constipation: Secondary | ICD-10-CM | POA: Diagnosis not present

## 2018-10-21 DIAGNOSIS — Z79899 Other long term (current) drug therapy: Secondary | ICD-10-CM

## 2018-10-21 DIAGNOSIS — E1121 Type 2 diabetes mellitus with diabetic nephropathy: Secondary | ICD-10-CM | POA: Diagnosis not present

## 2018-10-21 DIAGNOSIS — F32A Depression, unspecified: Secondary | ICD-10-CM

## 2018-10-21 DIAGNOSIS — R69 Illness, unspecified: Secondary | ICD-10-CM | POA: Diagnosis not present

## 2018-10-21 DIAGNOSIS — N184 Chronic kidney disease, stage 4 (severe): Secondary | ICD-10-CM | POA: Diagnosis not present

## 2018-10-21 DIAGNOSIS — E876 Hypokalemia: Secondary | ICD-10-CM

## 2018-10-21 DIAGNOSIS — E782 Mixed hyperlipidemia: Secondary | ICD-10-CM

## 2018-10-21 DIAGNOSIS — J441 Chronic obstructive pulmonary disease with (acute) exacerbation: Secondary | ICD-10-CM

## 2018-10-21 DIAGNOSIS — F419 Anxiety disorder, unspecified: Secondary | ICD-10-CM

## 2018-10-21 DIAGNOSIS — F329 Major depressive disorder, single episode, unspecified: Secondary | ICD-10-CM

## 2018-10-21 DIAGNOSIS — J449 Chronic obstructive pulmonary disease, unspecified: Secondary | ICD-10-CM | POA: Diagnosis not present

## 2018-10-21 DIAGNOSIS — Z794 Long term (current) use of insulin: Secondary | ICD-10-CM

## 2018-10-21 DIAGNOSIS — T402X5A Adverse effect of other opioids, initial encounter: Secondary | ICD-10-CM

## 2018-10-21 LAB — BASIC METABOLIC PANEL WITH GFR
BUN/Creatinine Ratio: 11 (calc) (ref 6–22)
BUN: 30 mg/dL — ABNORMAL HIGH (ref 7–25)
CO2: 28 mmol/L (ref 20–32)
Calcium: 8.4 mg/dL — ABNORMAL LOW (ref 8.6–10.4)
Chloride: 104 mmol/L (ref 98–110)
Creat: 2.66 mg/dL — ABNORMAL HIGH (ref 0.60–0.88)
GFR, EST AFRICAN AMERICAN: 19 mL/min/{1.73_m2} — AB (ref >=60)
GFR, EST NON AFRICAN AMERICAN: 16 mL/min/{1.73_m2} — AB (ref >=60)
GLUCOSE: 70 mg/dL (ref 65–139)
Potassium: 2.8 mmol/L — ABNORMAL LOW (ref 3.5–5.3)
Sodium: 143 mmol/L (ref 135–146)

## 2018-10-21 MED ORDER — SIMVASTATIN 40 MG PO TABS: 40.0000 mg | ORAL_TABLET | Freq: Every day | ORAL | 1 refills | Status: DC

## 2018-10-21 MED ORDER — HYDRALAZINE HCL 50 MG PO TABS: 50.0000 mg | ORAL_TABLET | Freq: Two times a day (BID) | ORAL | 3 refills | Status: DC

## 2018-10-21 NOTE — Progress Notes (Signed)
Careteam: Patient Care Team: [name], NP as PCP - General (Nurse Practitioner) [name], MD as Consulting Physician (Ophthalmology) [name], MD as Consulting Physician (Nephrology)  Advanced Directive information Does Patient Have a Medical Advance Directive?: Yes, Type of Advance Directive: Out of facility DNR (pink MOST or yellow form), Pre-existing out of facility DNR order (yellow form or pink MOST form): Yellow form placed in chart (order not valid for inpatient use)  Allergies  Allergen Reactions  . Fluticasone-Salmeterol     numbness  . Tramadol Other (See Comments)    Leg cramps   . Codeine Nausea And Vomiting and Other (See Comments)    Patient states N/V with codeine  . Hydrocodone Nausea And Vomiting  . Penicillins Rash and Other (See Comments)    Patient states rash/itch with penicillin Has patient had a PCN reaction causing immediate rash, facial/tongue/throat swelling, SOB or lightheadedness with hypotension: Yes- broke me out and i was itching Has patient had a PCN reaction causing severe rash involving mucus membranes or skin necrosis: No Has patient had a PCN reaction that required hospitalization Yes- i was already in the hospital Has patient had a PCN reaction occurring within the last 10 years: No If all of the above answers are "NO  . Tylenol [Acetaminophen] Hives    Chief Complaint  Patient presents with  . Medical Management of Chronic Issues    Pt is being seen for a 3 month routine visit.      HPI: Patient is a 82 y.o. female seen in the office today for routine follow up.  Reports she takes all her pills in the morning and then will take some medication at night.   Constipation- attempted to use movantik but did not work. Reports she is still constipation.    Chronic pain-tramadol on medication list however she reports she is not taking. States she is only using hydrocodone for pain, this helps with pain. During the  visit gets very uncomfortable.   Hyperlipidemia- taking zocor 20 mg by mouth daily   Hypertension/CHF/CAD- using ASA, metoprolol daily, lasix with potassium, no swelling or chest pains. rx for hydralazine 50 mg TID but only taking twice daily   DM-A1c up to 7.3 from 7.0, denies changes in diet. Reports occasional low blood sugar - reports blood glucose machine is messed up and needs to get another meter.  Takes insulin twice daily.  Uses gabapentin 300 mg by mouth twice daily, also takes gabapentin 100 mg only in the morning. Using Cymbalta 30 mg by mouth daily for mood and nerve pains.   Anxiety- stable on cymbalta.   COPD- following with pulmonary- maintained on budesonide and duoneb. She has been instructed to use CPAP at hs and to wear at least 4 hours at night. Wears "sometimes"  GERD/nasea- using carafate and nexium to help with GERD symptoms with nausea   CKD- following with nephrology, not on ACE/ARB due to kidney function   PAD- currently using ASA, cilostazol and simvastatin.   Neck pain- went to ED due to increase pain in neck. Used heating pad and it burned her- reports this was a new heating pad- her brother is keeping an eye on it. Healing at this time. Using Vaseline.    Review of Systems:  Review of Systems  Constitutional: Negative for chills, fever and weight loss.  HENT: Positive for hearing loss.   Respiratory: Negative for cough, sputum production and shortness of breath.   Cardiovascular: Positive for  leg swelling. Negative for chest pain and palpitations.  Gastrointestinal: Negative for abdominal pain, constipation, diarrhea and heartburn.  Genitourinary: Negative for dysuria, frequency and urgency.  Musculoskeletal: Positive for back pain, joint pain, myalgias and neck pain (has improved). Negative for falls.       Chronic back and shoulder pain also pain in legs   Skin: Negative.   Neurological: Positive for tingling, sensory change and weakness. Negative  for dizziness and headaches.  Psychiatric/Behavioral: Negative for depression and memory loss. The patient is nervous/anxious. The patient does not have insomnia.     Past Medical History:  Diagnosis Date  . Acute bronchitis   . Anxiety   . Arthritis   . Chronic kidney disease, stage II (mild)   . Complications affecting other specified body systems, hypertension   . Congestive heart failure, unspecified    stress test in March at [facility]  . COPD (chronic obstructive pulmonary disease) ([place])    present for several years, diagnosed in the last few years  . Degenerative arthritis of knee, bilateral   . Diabetes mellitus   . Diaphragmatic hernia without mention of obstruction or gangrene   . Disorder of bone and cartilage, unspecified   . GERD (gastroesophageal reflux disease)   . Hiatal hernia   . Hyperlipidemia   . Hypertension   . Hypopotassemia   . Insomnia, unspecified   . Lumbago   . Obese   . Osteoarthrosis, unspecified whether generalized or localized, unspecified site   . PONV (postoperative nausea and vomiting)   . Scoliosis   . Secondary diabetes mellitus with renal manifestations, not stated as uncontrolled, or unspecified(249.40)   . Unspecified glaucoma(365.9)   . Unspecified hereditary and idiopathic peripheral neuropathy    Past Surgical History:  Procedure Laterality Date  . ABDOMINAL HYSTERECTOMY    . COLONOSCOPY  08/06/2011  . ESOPHAGOGASTRODUODENOSCOPY (EGD) WITH PROPOFOL N/A 05/26/2018   Procedure: ESOPHAGOGASTRODUODENOSCOPY (EGD) WITH PROPOFOL;  Surgeon: Milus Banister, MD;  Location: [facility];  Service: Endoscopy;  Laterality: N/A;  . FOOT SURGERY    . KNEE ARTHROSCOPY     bilateral  . SHOULDER SURGERY    . TOTAL HIP ARTHROPLASTY     bilateral   Social History:   reports that she quit smoking about 21 years ago. Her smoking use included cigarettes. She has a 1.50 pack-year smoking history. She has never used smokeless tobacco. She  reports that she does not drink alcohol or use drugs.  Family History  Problem Relation Age of Onset  . Diabetes Mother   . Heart disease Mother   . Heart disease Brother   . Diabetes Brother   . Kidney disease Father   . Scoliosis Sister   . Diabetes Brother   . Diabetes Brother   . Heart disease Brother   . Scoliosis Brother   . Stroke Brother   . Heart attack Brother     Medications: Patient's Medications  New Prescriptions   No medications on file  Previous Medications   ALBUTEROL (PROVENTIL HFA;VENTOLIN HFA) 108 (90 BASE) MCG/ACT INHALER    Inhale 2 puffs into the lungs every 4 (four) hours as needed for wheezing or shortness of breath.   ASPIRIN EC 81 MG TABLET    Take 81 mg by mouth daily.   BUDESONIDE (PULMICORT) 0.25 MG/2ML NEBULIZER SOLUTION    USE 1 AMPULE IN NEBULIZER TWICE DAILY ROUTINELY FOR COPD   CHOLECALCIFEROL (VITAMIN D3) 2000 UNITS TABS    Take 2,000 Units by  mouth daily.   CILOSTAZOL (PLETAL) 100 MG TABLET    TAKE 1 TABLET BY MOUTH ONCE DAILY   CYCLOSPORINE (RESTASIS) 0.05 % OPHTHALMIC EMULSION    Place 1 drop into both eyes 2 (two) times daily.   DICLOFENAC SODIUM (VOLTAREN) 1 % GEL    APPLY 4 GRAMS TOPICALLY TWICE DAILY AS NEEDED FOR PAIN   DULOXETINE (CYMBALTA) 30 MG CAPSULE    TAKE 1 CAPSULE BY MOUTH EVERY DAY TO HELP ANXIETY AND PAINS.   EASY TOUCH INSULIN SYRINGE 31G X 5/16" 0.5 ML MISC    USE UP TO 2 TIMES A DAY AS DIRECTED   ESOMEPRAZOLE (NEXIUM) 40 MG CAPSULE    TAKE 1 CAPSULE BY MOUTH ONCE DAILY   FUROSEMIDE (LASIX) 40 MG TABLET    TAKE 1 TABLET BY MOUTH TWICE A DAY   GABAPENTIN (NEURONTIN) 100 MG CAPSULE    TAKE 2 CAPSULES BY MOUTH THREE TIMES A DAY   GABAPENTIN (NEURONTIN) 300 MG CAPSULE    TAKE 1 CAPSULE BY MOUTH TWICE A DAY   GUAIFENESIN (ROBITUSSIN) 100 MG/5ML SYRUP    Take 200 mg by mouth 3 (three) times daily as needed for cough.   HYDRALAZINE (APRESOLINE) 50 MG TABLET    TAKE 1 TABLET BY MOUTH THREE TIMES A DAY   HYDROCODONE-ACETAMINOPHEN  (NORCO) 10-325 MG TABLET    Take 1 tablet by mouth every 6 (six) hours as needed.   INSULIN ASPART PROTAMINE- ASPART (NOVOLOG MIX 70/30) (70-30) 100 UNIT/ML INJECTION    Inject 40 units subcutaneously with breakfast and inject 10 units subcutaneously with dinner to control blood sugar   IPRATROPIUM-ALBUTEROL (DUONEB) 0.5-2.5 (3) MG/3ML SOLN    Take 3 mLs by nebulization 2 (two) times daily. Dx: J44.1   LORATADINE (CLARITIN) 10 MG TABLET    Take 1 tablet (10 mg total) by mouth daily. For seasonal allergy   LUBIPROSTONE (AMITIZA) 24 MCG CAPSULE    Take 24 mcg by mouth 2 (two) times daily.   METOPROLOL SUCCINATE (TOPROL-XL) 25 MG 24 HR TABLET    TAKE 1 TABLET BY MOUTH ONCE DAILY WITH OR IMMEDIATELY FOLLOWING A MEAL FOR BLOOD PRESSURE   MOVANTIK 12.5 MG TABS TABLET    TAKE 1 TABLET BY MOUTH EVERY DAY   MULTIPLE VITAMIN (MULTIVITAMIN) TABLET    Take 1 tablet by mouth daily.   ONDANSETRON (ZOFRAN ODT) 8 MG DISINTEGRATING TABLET    Take 1 tablet (8 mg total) by mouth every 8 (eight) hours as needed for nausea or vomiting.   POTASSIUM CHLORIDE SA (K-DUR,KLOR-CON) 20 MEQ TABLET    TAKE 1 TABLET BY MOUTH ONCE DAILY   SIMVASTATIN (ZOCOR) 20 MG TABLET    TAKE 1 TABLET BY MOUTH ONCE DAILY   SUCRALFATE (CARAFATE) 1 G TABLET    TAKE 1 TABLET BY MOUTH TWICE DAILY ON EMPTY STOMACH   TAMSULOSIN (FLOMAX) 0.4 MG CAPS CAPSULE    Take 1 capsule (0.4 mg total) by mouth daily.   TIMOLOL (TIMOPTIC) 0.5 % OPHTHALMIC SOLUTION    INSTILL 1 DROP INTO EACH EYE TWICE A DAY   TRAMADOL (ULTRAM) 50 MG TABLET    Take 1 tablet (50 mg total) by mouth every 12 (twelve) hours as needed for moderate pain.   TRAVATAN Z 0.004 % SOLN OPHTHALMIC SOLUTION    Place 1 drop into both eyes at bedtime.   Modified Medications   No medications on file  Discontinued Medications   DOXYCYCLINE (VIBRA-TABS) 100 MG TABLET    Take 1 tablet (100  mg total) by mouth 2 (two) times daily. For sinus infection     Physical Exam:  Vitals:   10/21/18 1040    BP: 138/84  Pulse: 67  Temp: 97.9 F (36.6 C)  TempSrc: Oral  SpO2: 94%  Weight: 211 lb (95.7 kg)  Height: _0  (1.6 m)   Body mass index is 37.38 kg/m.  Physical Exam  Constitutional: She is oriented to person, place, and time. She appears well-developed and well-nourished. No distress.  HENT:  Head: Normocephalic and atraumatic.  Mouth/Throat: Oropharynx is clear and moist. No oropharyngeal exudate.  Eyes: Pupils are equal, round, and reactive to light. Conjunctivae are normal.  Neck: Normal range of motion. Neck supple.  Cardiovascular: Normal rate, regular rhythm and normal heart sounds.  Pulmonary/Chest: Effort normal and breath sounds normal.  Abdominal: Soft. Bowel sounds are normal.  Musculoskeletal: She exhibits tenderness (lower back, legs, chest, shoulders). She exhibits no edema.  In wheelchair today  Neurological: She is alert and oriented to person, place, and time.  Skin: Skin is warm and dry. She is not diaphoretic.  Healing linear burn marks noted to left shoulder blade  Psychiatric: She has a normal mood and affect.    Labs reviewed: Basic Metabolic Panel: Recent Labs    03/24/18 0806 07/13/18 0807 10/17/18 0801  NA 143 139 141  K 3.7 3.7 2.8*  CL 106 97* 101  CO2 25 32 27  GLUCOSE 123* 171* 193*  BUN _1 CREATININE 2.25* 2.20* 2.68*  CALCIUM 9.3 9.0 8.4*   Liver Function Tests: Recent Labs    12/27/17 1045 03/24/18 0806 07/13/18 0807 10/17/18 0801  AST _2 --   ALT _3 BILITOT 0.2 0.3 0.2  --   PROT 6.7 6.9 6.0*  --    No results for input(s): LIPASE, AMYLASE in the last 8760 hours. No results for input(s): AMMONIA in the last 8760 hours. CBC: Recent Labs    07/13/18 0807  WBC 6.0  NEUTROABS 2,838  HGB 9.9*  HCT 30.9*  MCV 84.7  PLT 232   Lipid Panel: Recent Labs    11/24/17 0923 07/13/18 0807 10/17/18 0801  CHOL 164 159 169  HDL 64 47* 43*  LDLCALC 75 83 92  TRIG 146 192* 242*  CHOLHDL 2.6 3.4 3.9    TSH: No results for input(s): TSH in the last 8760 hours. A1C: Lab Results  Component Value Date   HGBA1C 7.3 (H) 10/17/2018     Assessment/Plan 1. Hypokalemia -has increase potassium to 40 meq x 2 days, pt with hx of CKD, will follow up BMP at this time.  - BMP with eGFR([facility])  2. Essential hypertension Stable on current regimen, will continue current medications.  - hydrALAZINE (APRESOLINE) 50 MG tablet; Take 1 tablet (50 mg total) by mouth 2 (two) times daily.  Dispense: 90 tablet; Refill: 3  3. Mixed hyperlipidemia Not at goal, will increase zocor to 40 mg daily  - simvastatin (ZOCOR) 40 MG tablet; Take 1 tablet (40 mg total) by mouth daily.  Dispense: 90 tablet; Refill: 1  4. Type 2 diabetes mellitus with diabetic nephropathy, with long-term current use of insulin ([facility]) -states she is having problems with her meter at home. We will send another to her pharmacy at this time. To continue current regimen. Dietary modifications enocuraged   5. Constipation due to opioid therapy -movantik not effective, amitiza on list but she is not taking this medication, samples  provided.   6. Anxiety and depression Stable on cymbalta  7. Chronic obstructive pulmonary disease, unspecified COPD type ([place]) Stable at this time. Will continue current regimen. Following with pulmonary   8. Chronic kidney disease (CKD), stage IV (severe) ([place]) Ongoing follow up with Nephrology. Encourage proper hydration and to avoid NSAIDS (Aleve, Advil, Motrin, Ibuprofen)   9. Polypharmacy - Ambulatory referral to [facility]-  There s a lot of confusion with medication- reported to CMA that she was taking medications then I reviewed with her and she reported something different to me, CMA called in the room and pt was giving different information again.   Next appt:  Carlos American. [place], [place] [facility] [phone]

## 2018-10-21 NOTE — Patient Instructions (Addendum)
Increase zocor to 40 mg daily- take 2 of the 20 mg tablet  To use amitiza 24 mcg twice daily with food for constipation  We will be in touch with you for someone else to look over medication  Follow up in 3 month

## 2018-10-22 DIAGNOSIS — J449 Chronic obstructive pulmonary disease, unspecified: Secondary | ICD-10-CM | POA: Diagnosis not present

## 2018-10-24 ENCOUNTER — Other Ambulatory Visit: Payer: Self-pay

## 2018-10-24 ENCOUNTER — Other Ambulatory Visit: Payer: Medicare HMO

## 2018-10-24 ENCOUNTER — Telehealth: Payer: Self-pay

## 2018-10-24 ENCOUNTER — Ambulatory Visit (INDEPENDENT_AMBULATORY_CARE_PROVIDER_SITE_OTHER): Payer: Medicare HMO

## 2018-10-24 DIAGNOSIS — Z23 Encounter for immunization: Secondary | ICD-10-CM | POA: Diagnosis not present

## 2018-10-24 DIAGNOSIS — E876 Hypokalemia: Secondary | ICD-10-CM

## 2018-10-24 DIAGNOSIS — R69 Illness, unspecified: Secondary | ICD-10-CM | POA: Diagnosis not present

## 2018-10-24 MED ORDER — ONETOUCH ULTRA 2 W/DEVICE KIT: PACK | 0 refills | Status: DC

## 2018-10-24 MED ORDER — GLUCOSE BLOOD VI STRP: 1.0000 | ORAL_STRIP | 11 refills | Status: DC | PRN

## 2018-10-24 NOTE — Telephone Encounter (Signed)
Per Michelle Buck: Will you send new glucometer to pharmacy for her, she states her is not working correctly- not sure how accurate that is but told her we could send another one to pharmacy   Sent rx for one touch ultra glucose meter and testing supplies.

## 2018-10-25 ENCOUNTER — Other Ambulatory Visit: Payer: Self-pay

## 2018-10-25 DIAGNOSIS — N184 Chronic kidney disease, stage 4 (severe): Secondary | ICD-10-CM

## 2018-10-25 DIAGNOSIS — E876 Hypokalemia: Secondary | ICD-10-CM

## 2018-10-25 LAB — BASIC METABOLIC PANEL
BUN/Creatinine Ratio: 11 (calc) (ref 6–22)
BUN: 25 mg/dL (ref 7–25)
CALCIUM: 8.2 mg/dL — AB (ref 8.6–10.4)
CO2: 22 mmol/L (ref 20–32)
Chloride: 105 mmol/L (ref 98–110)
Creat: 2.28 mg/dL — ABNORMAL HIGH (ref 0.60–0.88)
GLUCOSE: 228 mg/dL — AB (ref 65–99)
Potassium: 3.2 mmol/L — ABNORMAL LOW (ref 3.5–5.3)
Sodium: 139 mmol/L (ref 135–146)

## 2018-10-31 ENCOUNTER — Telehealth: Payer: Self-pay | Admitting: *Deleted

## 2018-10-31 NOTE — Telephone Encounter (Signed)
Received fax from [facility] [phone] regarding repairs to patient's Power Wheelchair. Orders placed in [place] folder for review. To be faxed back to [phone]  Orders For: [place] Replacement [id number] New VSI [id number] Labor

## 2018-11-01 ENCOUNTER — Other Ambulatory Visit: Payer: Medicare HMO

## 2018-11-01 ENCOUNTER — Other Ambulatory Visit: Payer: Self-pay

## 2018-11-01 DIAGNOSIS — M545 Low back pain, unspecified: Secondary | ICD-10-CM

## 2018-11-01 DIAGNOSIS — E876 Hypokalemia: Secondary | ICD-10-CM

## 2018-11-01 DIAGNOSIS — G8929 Other chronic pain: Secondary | ICD-10-CM

## 2018-11-01 DIAGNOSIS — N184 Chronic kidney disease, stage 4 (severe): Secondary | ICD-10-CM

## 2018-11-01 LAB — BASIC METABOLIC PANEL WITH GFR
BUN/Creatinine Ratio: 10 (calc) (ref 6–22)
BUN: 25 mg/dL (ref 7–25)
CALCIUM: 8.6 mg/dL (ref 8.6–10.4)
CO2: 18 mmol/L — AB (ref 20–32)
CREATININE: 2.48 mg/dL — AB (ref 0.60–0.88)
Chloride: 107 mmol/L (ref 98–110)
GFR, EST NON AFRICAN AMERICAN: 18 mL/min/{1.73_m2} — AB (ref >=60)
GFR, Est African American: 21 mL/min/{1.73_m2} — ABNORMAL LOW (ref >=60)
Glucose, Bld: 130 mg/dL — ABNORMAL HIGH (ref 65–99)
Potassium: 4.1 mmol/L (ref 3.5–5.3)
Sodium: 140 mmol/L (ref 135–146)

## 2018-11-01 MED ORDER — LUBIPROSTONE 24 MCG PO CAPS: 24.0000 ug | ORAL_CAPSULE | Freq: Two times a day (BID) | ORAL | 1 refills | Status: DC

## 2018-11-01 MED ORDER — HYDROCODONE-ACETAMINOPHEN 10-325 MG PO TABS: 1.0000 | ORAL_TABLET | Freq: Four times a day (QID) | ORAL | 0 refills | Status: DC | PRN

## 2018-11-01 NOTE — Telephone Encounter (Signed)
[place] Database verified and compliance confirmed   Per patient please call when request is complete

## 2018-11-01 NOTE — Telephone Encounter (Signed)
Spoke with patient, patient aware rx's sent

## 2018-11-03 ENCOUNTER — Telehealth: Payer: Self-pay

## 2018-11-03 NOTE — Telephone Encounter (Signed)
11-03-2018 spoke with patient regarding medication assistance.Will follow-up with physician for clarification.

## 2018-11-07 ENCOUNTER — Telehealth: Payer: Self-pay

## 2018-11-10 DIAGNOSIS — R600 Localized edema: Secondary | ICD-10-CM | POA: Diagnosis not present

## 2018-11-10 DIAGNOSIS — M48061 Spinal stenosis, lumbar region without neurogenic claudication: Secondary | ICD-10-CM | POA: Diagnosis not present

## 2018-11-10 DIAGNOSIS — M1990 Unspecified osteoarthritis, unspecified site: Secondary | ICD-10-CM | POA: Diagnosis not present

## 2018-11-10 DIAGNOSIS — S43429A Sprain of unspecified rotator cuff capsule, initial encounter: Secondary | ICD-10-CM | POA: Diagnosis not present

## 2018-11-14 DIAGNOSIS — G4733 Obstructive sleep apnea (adult) (pediatric): Secondary | ICD-10-CM | POA: Diagnosis not present

## 2018-11-14 DIAGNOSIS — J449 Chronic obstructive pulmonary disease, unspecified: Secondary | ICD-10-CM | POA: Diagnosis not present

## 2018-11-16 DIAGNOSIS — R69 Illness, unspecified: Secondary | ICD-10-CM | POA: Diagnosis not present

## 2018-11-17 ENCOUNTER — Ambulatory Visit: Payer: Medicare HMO | Admitting: Nurse Practitioner

## 2018-11-17 ENCOUNTER — Other Ambulatory Visit: Payer: Medicare HMO

## 2018-11-17 DIAGNOSIS — E876 Hypokalemia: Secondary | ICD-10-CM

## 2018-11-17 LAB — BASIC METABOLIC PANEL WITH GFR
BUN/Creatinine Ratio: 12 (calc) (ref 6–22)
BUN: 30 mg/dL — ABNORMAL HIGH (ref 7–25)
CALCIUM: 7.4 mg/dL — AB (ref 8.6–10.4)
CHLORIDE: 108 mmol/L (ref 98–110)
CO2: 20 mmol/L (ref 20–32)
Creat: 2.56 mg/dL — ABNORMAL HIGH (ref 0.60–0.88)
GFR, Est African American: 20 mL/min/{1.73_m2} — ABNORMAL LOW (ref >=60)
GFR, Est Non African American: 17 mL/min/{1.73_m2} — ABNORMAL LOW (ref >=60)
GLUCOSE: 157 mg/dL — AB (ref 65–99)
Potassium: 3.7 mmol/L (ref 3.5–5.3)
Sodium: 138 mmol/L (ref 135–146)

## 2018-11-18 ENCOUNTER — Other Ambulatory Visit: Payer: Self-pay | Admitting: Nurse Practitioner

## 2018-11-18 MED ORDER — POTASSIUM CHLORIDE CRYS ER 20 MEQ PO TBCR: 40.0000 meq | EXTENDED_RELEASE_TABLET | Freq: Every day | ORAL | 1 refills | Status: DC

## 2018-11-21 DIAGNOSIS — J449 Chronic obstructive pulmonary disease, unspecified: Secondary | ICD-10-CM | POA: Diagnosis not present

## 2018-11-28 ENCOUNTER — Other Ambulatory Visit: Payer: Self-pay

## 2018-11-28 DIAGNOSIS — G8929 Other chronic pain: Secondary | ICD-10-CM

## 2018-11-28 DIAGNOSIS — M545 Low back pain: Principal | ICD-10-CM

## 2018-11-28 DIAGNOSIS — R69 Illness, unspecified: Secondary | ICD-10-CM | POA: Diagnosis not present

## 2018-11-28 NOTE — Telephone Encounter (Signed)
Patient called and stated she needed pain medication refill. I do not have current access to database, so I am sending message to provider. Please advise.

## 2018-11-29 NOTE — Telephone Encounter (Signed)
Last refill was 11/01/18, not due at this time

## 2018-12-01 MED ORDER — HYDROCODONE-ACETAMINOPHEN 10-325 MG PO TABS: 1.0000 | ORAL_TABLET | Freq: Four times a day (QID) | ORAL | 0 refills | Status: DC | PRN

## 2018-12-02 ENCOUNTER — Other Ambulatory Visit: Payer: Self-pay | Admitting: Nurse Practitioner

## 2018-12-05 NOTE — Telephone Encounter (Signed)
11/07/2018 left message on voicemail to return call.MA

## 2018-12-06 ENCOUNTER — Other Ambulatory Visit: Payer: Self-pay

## 2018-12-06 MED ORDER — POTASSIUM CHLORIDE CRYS ER 20 MEQ PO TBCR: 40.0000 meq | EXTENDED_RELEASE_TABLET | Freq: Every day | ORAL | 1 refills | Status: DC

## 2018-12-09 DIAGNOSIS — E1151 Type 2 diabetes mellitus with diabetic peripheral angiopathy without gangrene: Secondary | ICD-10-CM | POA: Diagnosis not present

## 2018-12-09 DIAGNOSIS — E785 Hyperlipidemia, unspecified: Secondary | ICD-10-CM | POA: Diagnosis not present

## 2018-12-09 DIAGNOSIS — I509 Heart failure, unspecified: Secondary | ICD-10-CM | POA: Diagnosis not present

## 2018-12-09 DIAGNOSIS — Z794 Long term (current) use of insulin: Secondary | ICD-10-CM | POA: Diagnosis not present

## 2018-12-09 DIAGNOSIS — J449 Chronic obstructive pulmonary disease, unspecified: Secondary | ICD-10-CM | POA: Diagnosis not present

## 2018-12-09 DIAGNOSIS — R69 Illness, unspecified: Secondary | ICD-10-CM | POA: Diagnosis not present

## 2018-12-09 DIAGNOSIS — G8929 Other chronic pain: Secondary | ICD-10-CM | POA: Diagnosis not present

## 2018-12-09 DIAGNOSIS — I110 Hypertensive heart disease with heart failure: Secondary | ICD-10-CM | POA: Diagnosis not present

## 2018-12-09 DIAGNOSIS — E1140 Type 2 diabetes mellitus with diabetic neuropathy, unspecified: Secondary | ICD-10-CM | POA: Diagnosis not present

## 2018-12-14 DIAGNOSIS — R69 Illness, unspecified: Secondary | ICD-10-CM | POA: Diagnosis not present

## 2018-12-15 DIAGNOSIS — G4733 Obstructive sleep apnea (adult) (pediatric): Secondary | ICD-10-CM | POA: Diagnosis not present

## 2018-12-15 DIAGNOSIS — J449 Chronic obstructive pulmonary disease, unspecified: Secondary | ICD-10-CM | POA: Diagnosis not present

## 2018-12-22 DIAGNOSIS — J449 Chronic obstructive pulmonary disease, unspecified: Secondary | ICD-10-CM | POA: Diagnosis not present

## 2018-12-22 DIAGNOSIS — H401131 Primary open-angle glaucoma, bilateral, mild stage: Secondary | ICD-10-CM | POA: Diagnosis not present

## 2018-12-27 DIAGNOSIS — M48061 Spinal stenosis, lumbar region without neurogenic claudication: Secondary | ICD-10-CM | POA: Diagnosis not present

## 2018-12-27 DIAGNOSIS — R600 Localized edema: Secondary | ICD-10-CM | POA: Diagnosis not present

## 2018-12-27 DIAGNOSIS — S43429A Sprain of unspecified rotator cuff capsule, initial encounter: Secondary | ICD-10-CM | POA: Diagnosis not present

## 2018-12-27 DIAGNOSIS — M1990 Unspecified osteoarthritis, unspecified site: Secondary | ICD-10-CM | POA: Diagnosis not present

## 2019-01-10 DIAGNOSIS — R69 Illness, unspecified: Secondary | ICD-10-CM | POA: Diagnosis not present

## 2019-01-15 ENCOUNTER — Other Ambulatory Visit: Payer: Self-pay | Admitting: Nurse Practitioner

## 2019-01-15 DIAGNOSIS — M4316 Spondylolisthesis, lumbar region: Secondary | ICD-10-CM

## 2019-01-15 DIAGNOSIS — E1121 Type 2 diabetes mellitus with diabetic nephropathy: Secondary | ICD-10-CM

## 2019-01-15 DIAGNOSIS — J449 Chronic obstructive pulmonary disease, unspecified: Secondary | ICD-10-CM | POA: Diagnosis not present

## 2019-01-15 DIAGNOSIS — Z794 Long term (current) use of insulin: Secondary | ICD-10-CM

## 2019-01-16 DIAGNOSIS — G4733 Obstructive sleep apnea (adult) (pediatric): Secondary | ICD-10-CM | POA: Diagnosis not present

## 2019-01-22 DIAGNOSIS — J449 Chronic obstructive pulmonary disease, unspecified: Secondary | ICD-10-CM | POA: Diagnosis not present

## 2019-01-27 ENCOUNTER — Encounter: Payer: Self-pay | Admitting: Nurse Practitioner

## 2019-01-27 ENCOUNTER — Ambulatory Visit: Payer: Medicare HMO | Admitting: Nurse Practitioner

## 2019-01-27 ENCOUNTER — Ambulatory Visit (INDEPENDENT_AMBULATORY_CARE_PROVIDER_SITE_OTHER): Payer: Medicare HMO | Admitting: Nurse Practitioner

## 2019-01-27 VITALS — BP 124/78 | HR 68 | Temp 97.7°F | Ht 63.0 in | Wt 194.0 lb

## 2019-01-27 DIAGNOSIS — Z794 Long term (current) use of insulin: Secondary | ICD-10-CM | POA: Diagnosis not present

## 2019-01-27 DIAGNOSIS — I10 Essential (primary) hypertension: Secondary | ICD-10-CM | POA: Diagnosis not present

## 2019-01-27 DIAGNOSIS — M4316 Spondylolisthesis, lumbar region: Secondary | ICD-10-CM

## 2019-01-27 DIAGNOSIS — K089 Disorder of teeth and supporting structures, unspecified: Secondary | ICD-10-CM

## 2019-01-27 DIAGNOSIS — E782 Mixed hyperlipidemia: Secondary | ICD-10-CM

## 2019-01-27 DIAGNOSIS — F329 Major depressive disorder, single episode, unspecified: Secondary | ICD-10-CM

## 2019-01-27 DIAGNOSIS — N184 Chronic kidney disease, stage 4 (severe): Secondary | ICD-10-CM

## 2019-01-27 DIAGNOSIS — T402X5A Adverse effect of other opioids, initial encounter: Secondary | ICD-10-CM

## 2019-01-27 DIAGNOSIS — F32A Depression, unspecified: Secondary | ICD-10-CM

## 2019-01-27 DIAGNOSIS — D631 Anemia in chronic kidney disease: Secondary | ICD-10-CM

## 2019-01-27 DIAGNOSIS — R69 Illness, unspecified: Secondary | ICD-10-CM | POA: Diagnosis not present

## 2019-01-27 DIAGNOSIS — Z79899 Other long term (current) drug therapy: Secondary | ICD-10-CM | POA: Diagnosis not present

## 2019-01-27 DIAGNOSIS — E1121 Type 2 diabetes mellitus with diabetic nephropathy: Secondary | ICD-10-CM | POA: Diagnosis not present

## 2019-01-27 DIAGNOSIS — K5903 Drug induced constipation: Secondary | ICD-10-CM

## 2019-01-27 DIAGNOSIS — I5032 Chronic diastolic (congestive) heart failure: Secondary | ICD-10-CM

## 2019-01-27 DIAGNOSIS — E1169 Type 2 diabetes mellitus with other specified complication: Secondary | ICD-10-CM

## 2019-01-27 DIAGNOSIS — M170 Bilateral primary osteoarthritis of knee: Secondary | ICD-10-CM

## 2019-01-27 DIAGNOSIS — M545 Low back pain: Secondary | ICD-10-CM

## 2019-01-27 DIAGNOSIS — R112 Nausea with vomiting, unspecified: Secondary | ICD-10-CM | POA: Diagnosis not present

## 2019-01-27 DIAGNOSIS — I739 Peripheral vascular disease, unspecified: Secondary | ICD-10-CM

## 2019-01-27 DIAGNOSIS — E785 Hyperlipidemia, unspecified: Secondary | ICD-10-CM

## 2019-01-27 DIAGNOSIS — F419 Anxiety disorder, unspecified: Secondary | ICD-10-CM

## 2019-01-27 DIAGNOSIS — J449 Chronic obstructive pulmonary disease, unspecified: Secondary | ICD-10-CM

## 2019-01-27 DIAGNOSIS — G8929 Other chronic pain: Secondary | ICD-10-CM

## 2019-01-27 MED ORDER — ZOSTER VAC RECOMB ADJUVANTED 50 MCG/0.5ML IM SUSR: 0.5000 mL | Freq: Once | INTRAMUSCULAR | 0 refills | Status: AC

## 2019-01-27 MED ORDER — ASPIRIN EC 81 MG PO TBEC: 81.0000 mg | DELAYED_RELEASE_TABLET | Freq: Every day | ORAL | Status: DC

## 2019-01-27 MED ORDER — HYDROCODONE-ACETAMINOPHEN 10-325 MG PO TABS: 1.0000 | ORAL_TABLET | Freq: Four times a day (QID) | ORAL | 0 refills | Status: DC | PRN

## 2019-01-27 MED ORDER — TETANUS-DIPHTH-ACELL PERTUSSIS 5-2.5-18.5 LF-MCG/0.5 IM SUSP: 0.5000 mL | Freq: Once | INTRAMUSCULAR | 0 refills | Status: AC

## 2019-01-27 NOTE — Patient Instructions (Signed)
To call your gastroenterologist for follow up

## 2019-01-27 NOTE — Progress Notes (Signed)
Careteam: Patient Care Team: [name], NP as PCP - General (Nurse Practitioner) [name], MD as Consulting Physician (Ophthalmology) [name], MD as Consulting Physician (Nephrology)  Advanced Directive information Does Patient Have a Medical Advance Directive?: Yes, Type of Advance Directive: Out of facility DNR (pink MOST or yellow form), Pre-existing out of facility DNR order (yellow form or pink MOST form): Yellow form placed in chart (order not valid for inpatient use)  Allergies  Allergen Reactions  . Fluticasone-Salmeterol     numbness  . Tramadol Other (See Comments)    Leg cramps   . Codeine Nausea And Vomiting and Other (See Comments)    Patient states N/V with codeine  . Hydrocodone Nausea And Vomiting  . Penicillins Rash and Other (See Comments)    Patient states rash/itch with penicillin Has patient had a PCN reaction causing immediate rash, facial/tongue/throat swelling, SOB or lightheadedness with hypotension: Yes- broke me out and i was itching Has patient had a PCN reaction causing severe rash involving mucus membranes or skin necrosis: No Has patient had a PCN reaction that required hospitalization Yes- i was already in the hospital Has patient had a PCN reaction occurring within the last 10 years: No If all of the above answers are "NO  . Tylenol [Acetaminophen] Hives    Chief Complaint  Patient presents with  . Medical Management of Chronic Issues    3 month follow-up, patient c/o vomiting off/on x 1 month.   . Medication Refill    Hydrocodone, [place] database confirmed     HPI: Patient is a 83 y.o. female seen in the office today for routine follow up.  Pt was very confused with medication at last visit therefore referral was placed to [facility] for medication assistance/community resource. However she reports she is not letting "these ppl" into her house bc she does not understand why she needs them. She states her sister does her  medication but does not come to office visits with her and she does know medication names.   Constipation- stable, using amitiza 24 mcg BID  Chronic pain- using hydrocodone for pain, this helps with pain but ongoing. using cymbalta for pain management as well. Orthopedic states there is nothing else they can do for her.  Hyperlipidemia- taking zocor 40 mg by mouth daily   Hypertension/CHF/CAD- using ASA, metoprolol daily, hydralazine 50 mg BID, lasix with potassium, no swelling or chest pains.  Following with cardiology   DM-A1c up to 7.3 from 7.0, denies changes in diet. Reports occasional low blood sugar - Takes insulin twice daily. Does not have blood sugars.  Uses gabapentin 300 mg by mouth twice daily, also takes gabapentin 100 mg only in the morning. Using Cymbalta 30 mg by mouth daily for mood and nerve pains.   Anxiety- stable on cymbalta.   COPD- following with pulmonary- maintained on budesonide and duoneb. She has been instructed to use CPAP at hs and to wear at least 4 hours at night. Wears "sometimes"  GERD/nasea- using carafate and nexium to help with GERD symptoms with nausea. Had vomiting for 1 week. Had increase in GERD but this has improved. Hx of HH. Having a hard time chewing due to lack of dentures but she has been fitting and plans to get them soon.   CKD- following with nephrology, not on ACE/ARB due to kidney function   PAD- currently using ASA, cilostazol and simvastatin. Following with cardiology.   Dye eyes- following with ophthalmology, continues on  restatis     Review of Systems:  Review of Systems  Constitutional: Negative for chills, fever and weight loss.  HENT: Positive for hearing loss.   Respiratory: Negative for cough, sputum production and shortness of breath.   Cardiovascular: Negative for chest pain, palpitations and leg swelling.  Gastrointestinal: Positive for nausea and vomiting. Negative for abdominal pain, constipation, diarrhea  and heartburn.  Genitourinary: Negative for dysuria, frequency and urgency.  Musculoskeletal: Positive for back pain, joint pain, myalgias and neck pain (has improved). Negative for falls.       Chronic back and shoulder pain also pain in legs   Skin: Negative.   Neurological: Positive for tingling, sensory change and weakness. Negative for dizziness and headaches.  Psychiatric/Behavioral: Negative for depression and memory loss. The patient is nervous/anxious. The patient does not have insomnia.     Past Medical History:  Diagnosis Date  . Acute bronchitis   . Anxiety   . Arthritis   . Chronic kidney disease, stage II (mild)   . Complications affecting other specified body systems, hypertension   . Congestive heart failure, unspecified    stress test in March at [facility]  . COPD (chronic obstructive pulmonary disease) ([place])    present for several years, diagnosed in the last few years  . Degenerative arthritis of knee, bilateral   . Diabetes mellitus   . Diaphragmatic hernia without mention of obstruction or gangrene   . Disorder of bone and cartilage, unspecified   . GERD (gastroesophageal reflux disease)   . Hiatal hernia   . Hyperlipidemia   . Hypertension   . Hypopotassemia   . Insomnia, unspecified   . Lumbago   . Obese   . Osteoarthrosis, unspecified whether generalized or localized, unspecified site   . PONV (postoperative nausea and vomiting)   . Scoliosis   . Secondary diabetes mellitus with renal manifestations, not stated as uncontrolled, or unspecified(249.40)   . Unspecified glaucoma(365.9)   . Unspecified hereditary and idiopathic peripheral neuropathy    Past Surgical History:  Procedure Laterality Date  . ABDOMINAL HYSTERECTOMY    . COLONOSCOPY  08/06/2011  . ESOPHAGOGASTRODUODENOSCOPY (EGD) WITH PROPOFOL N/A 05/26/2018   Procedure: ESOPHAGOGASTRODUODENOSCOPY (EGD) WITH PROPOFOL;  Surgeon: Milus Banister, MD;  Location: [facility];  Service:  Endoscopy;  Laterality: N/A;  . FOOT SURGERY    . KNEE ARTHROSCOPY     bilateral  . SHOULDER SURGERY    . TOTAL HIP ARTHROPLASTY     bilateral   Social History:   reports that she quit smoking about 21 years ago. Her smoking use included cigarettes. She has a 1.50 pack-year smoking history. She has never used smokeless tobacco. She reports that she does not drink alcohol or use drugs.  Family History  Problem Relation Age of Onset  . Diabetes Mother   . Heart disease Mother   . Heart disease Brother   . Diabetes Brother   . Kidney disease Father   . Scoliosis Sister   . Diabetes Brother   . Diabetes Brother   . Heart disease Brother   . Scoliosis Brother   . Stroke Brother   . Heart attack Brother     Medications: Patient's Medications  New Prescriptions   No medications on file  Previous Medications   BUDESONIDE (PULMICORT) 0.25 MG/2ML NEBULIZER SOLUTION    USE 1 AMPULE IN NEBULIZER TWICE DAILY ROUTINELY FOR COPD   CILOSTAZOL (PLETAL) 100 MG TABLET    TAKE 1 TABLET BY MOUTH ONCE  DAILY   CYCLOSPORINE (RESTASIS) 0.05 % OPHTHALMIC EMULSION    Place 1 drop into both eyes 2 (two) times daily.   DULOXETINE (CYMBALTA) 30 MG CAPSULE    TAKE 1 CAPSULE BY MOUTH EVERY DAY TO HELP ANXIETY AND PAINS.   ESOMEPRAZOLE (NEXIUM) 40 MG CAPSULE    TAKE 1 CAPSULE BY MOUTH ONCE DAILY   HYDRALAZINE (APRESOLINE) 50 MG TABLET    Take 1 tablet (50 mg total) by mouth 2 (two) times daily.   HYDROCODONE-ACETAMINOPHEN (NORCO) 10-325 MG TABLET    Take 1 tablet by mouth every 6 (six) hours as needed.   INSULIN ASPART PROTAMINE- ASPART (NOVOLOG MIX 70/30) (70-30) 100 UNIT/ML INJECTION    Inject 40 units subcutaneously with breakfast and inject 10 units subcutaneously with dinner to control blood sugar   LUBIPROSTONE (AMITIZA) 24 MCG CAPSULE    Take 1 capsule (24 mcg total) by mouth 2 (two) times daily.   METOPROLOL SUCCINATE (TOPROL-XL) 25 MG 24 HR TABLET    TAKE 1 TABLET BY MOUTH ONCE DAILY WITH OR  IMMEDIATELY FOLLOWING A MEAL FOR BLOOD PRESSURE   POTASSIUM CHLORIDE SA (K-DUR,KLOR-CON) 20 MEQ TABLET    Take 2 tablets (40 mEq total) by mouth daily.   SIMVASTATIN (ZOCOR) 40 MG TABLET    Take 1 tablet (40 mg total) by mouth daily.   SUCRALFATE (CARAFATE) 1 G TABLET    TAKE 1 TABLET BY MOUTH TWICE DAILY ON EMPTY STOMACH   TIMOLOL (TIMOPTIC) 0.5 % OPHTHALMIC SOLUTION    INSTILL 1 DROP INTO EACH EYE TWICE A DAY   TRAVATAN Z 0.004 % SOLN OPHTHALMIC SOLUTION    Place 1 drop into both eyes at bedtime.   Modified Medications   No medications on file  Discontinued Medications   No medications on file     Physical Exam:  Vitals:   01/27/19 0859  BP: 124/78  Pulse: 68  Temp: 97.7 F (36.5 C)  TempSrc: Oral  SpO2: 97%  Weight: 194 lb (88 kg)  Height: '5\' 3"'$  (1.6 m)   Body mass index is 34.37 kg/m.  Physical Exam Constitutional:      General: She is not in acute distress.    Appearance: She is well-developed. She is not diaphoretic.  HENT:     Head: Normocephalic and atraumatic.     Mouth/Throat:     Pharynx: No oropharyngeal exudate.  Eyes:     Conjunctiva/sclera: Conjunctivae normal.     Pupils: Pupils are equal, round, and reactive to light.  Neck:     Musculoskeletal: Normal range of motion and neck supple.  Cardiovascular:     Rate and Rhythm: Normal rate and regular rhythm.     Pulses:          Dorsalis pedis pulses are 0 on the right side and 0 on the left side.       Posterior tibial pulses are 1+ on the right side and 1+ on the left side.     Heart sounds: Normal heart sounds.  Pulmonary:     Effort: Pulmonary effort is normal.     Breath sounds: Normal breath sounds.  Abdominal:     General: Bowel sounds are normal.     Palpations: Abdomen is soft.     Tenderness: There is abdominal tenderness (epigastric).  Musculoskeletal:        General: Tenderness (lower back, legs, chest, shoulders) present.     Right foot: Deformity present.     Comments: In wheelchair  today  Feet:     Right foot:     Protective Sensation: 3 sites tested. 3 sites sensed.     Skin integrity: Skin integrity normal.     Left foot:     Protective Sensation: 3 sites tested. 3 sites sensed.     Skin integrity: Skin integrity normal.  Skin:    General: Skin is warm and dry.  Neurological:     Mental Status: She is alert and oriented to person, place, and time.     Labs reviewed: Basic Metabolic Panel: Recent Labs    10/24/18 0803 11/01/18 0805 11/17/18 0834  NA 139 140 138  K 3.2* 4.1 3.7  CL 105 107 108  CO2 22 18* 20  GLUCOSE 228* 130* 157*  BUN 25 25 30*  CREATININE 2.28* 2.48* 2.56*  CALCIUM 8.2* 8.6 7.4*   Liver Function Tests: Recent Labs    03/24/18 0806 07/13/18 0807 10/17/18 0801  AST 14 14  --   ALT '8 7 9  '$ BILITOT 0.3 0.2  --   PROT 6.9 6.0*  --    No results for input(s): LIPASE, AMYLASE in the last 8760 hours. No results for input(s): AMMONIA in the last 8760 hours. CBC: Recent Labs    07/13/18 0807  WBC 6.0  NEUTROABS 2,838  HGB 9.9*  HCT 30.9*  MCV 84.7  PLT 232   Lipid Panel: Recent Labs    07/13/18 0807 10/17/18 0801  CHOL 159 169  HDL 47* 43*  LDLCALC 83 92  TRIG 192* 242*  CHOLHDL 3.4 3.9   TSH: No results for input(s): TSH in the last 8760 hours. A1C: Lab Results  Component Value Date   HGBA1C 7.3 (H) 10/17/2018     Assessment/Plan 1. Chronic midline low back pain without sciatica -continues with pain. Uses hydrocodone-apap as needed, takes as prescribes and only gets from our office. - HYDROcodone-acetaminophen (NORCO) 10-325 MG tablet; Take 1 tablet by mouth every 6 (six) hours as needed.  Dispense: 120 tablet; Refill: 0  2. Poor dentition -has had teeth removed and now awaiting dentures  3. Essential hypertension Stable, will continue current regimen - CBC with Differential/Platelet  4. Mixed hyperlipidemia Taking zocor 40 mg daily, dietary compliance encouraged  5. Type 2 diabetes mellitus  with diabetic nephropathy, with long-term current use of insulin (HCC) -does not have blood sugar log, reports blood sugars are elevated at time. Not compliant with diet (had nutty buddy prior to OV Today). Will continue current regimen and follow up lab - Hemoglobin A1c - CBC with Differential/Platelet  6. Constipation due to opioid therapy Stable on current regimen.  7. Anxiety and depression Controlled on current regimen.   8. Chronic obstructive pulmonary disease, unspecified COPD type (HCC) Stable on Pulmicort, no recent exacerbation.   9. Polypharmacy Ongoing. Dicussed having [facility] come out to her house to help her with medication management, she was agreeable to have them meet with her and her sister who is in charge of her medications.   10. Hyperlipidemia associated with type 2 diabetes mellitus ([place]) Continues on zocor.  - CMP with eGFR([facility])  11. Spondylolisthesis of lumbar region Chronic.  - HYDROcodone-acetaminophen (NORCO) 10-325 MG tablet; Take 1 tablet by mouth every 6 (six) hours as needed.  Dispense: 120 tablet; Refill: 0  12. Chronic diastolic congestive heart failure (HCC) -stable, euvolmic at this time. Will continue current regimen. Continues to follow with cardiologist.   44. PAD (peripheral artery disease) ([place]) Stable, continues on ASA and pletal.  14. Primary osteoarthritis of both knees Ongoing, using hydrocodone-apap PRN  15. Chronic kidney disease (CKD), stage IV (severe) (HCC) Ongoing, following with nephrology. Encourage proper hydration and to avoid NSAIDS (Aleve, Advil, Motrin, Ibuprofen)   16. Anemia of chronic renal failure, stage 4 (severe) (HCC) Will follow up CBC  17. Morbid obesity ([place]) Has lost weight but ongoing obesity, encouraged weight loss with diet modification. She is limited to exercise due to chronic pain and debility.   18. Nausea and vomiting, intractability of vomiting not specified, unspecified vomiting type -on and off  for 1 week, no diarrhea (having regular BMs). No fevers.  -tolerated meals yesterday and today -encouraged to follow up with GI, been almost 1 year with hx of HH and dysphagia with tortuous esophagus - Amylase - Lipase -CBC -CMP  Next appt: 3 months, sooner if needed  Bryar Dahms K. [place], [facility] [phone]

## 2019-01-28 LAB — CBC WITH DIFFERENTIAL/PLATELET
ABSOLUTE MONOCYTES: 590 {cells}/uL (ref 200–950)
BASOS ABS: 53 {cells}/uL (ref 0–200)
Basophils Relative: 0.6 %
EOS ABS: 238 {cells}/uL (ref 15–500)
Eosinophils Relative: 2.7 %
HEMATOCRIT: 33.6 % — AB (ref 35.0–45.0)
Hemoglobin: 10.6 g/dL — ABNORMAL LOW (ref 11.7–15.5)
LYMPHS ABS: 2279 {cells}/uL (ref 850–3900)
MCH: 26.3 pg — AB (ref 27.0–33.0)
MCHC: 31.5 g/dL — ABNORMAL LOW (ref 32.0–36.0)
MCV: 83.4 fL (ref 80.0–100.0)
MPV: 10.1 fL (ref 7.5–12.5)
Monocytes Relative: 6.7 %
NEUTROS PCT: 64.1 %
Neutro Abs: 5641 cells/uL (ref 1500–7800)
PLATELETS: 315 10*3/uL (ref 140–400)
RBC: 4.03 10*6/uL (ref 3.80–5.10)
RDW: 15.5 % — AB (ref 11.0–15.0)
TOTAL LYMPHOCYTE: 25.9 %
WBC: 8.8 10*3/uL (ref 3.8–10.8)

## 2019-01-28 LAB — COMPLETE METABOLIC PANEL WITH GFR
AG Ratio: 1.2 (calc) (ref 1.0–2.5)
ALBUMIN MSPROF: 3.7 g/dL (ref 3.6–5.1)
ALKALINE PHOSPHATASE (APISO): 134 U/L (ref 37–153)
ALT: 8 U/L (ref 6–29)
AST: 17 U/L (ref 10–35)
BILIRUBIN TOTAL: 0.3 mg/dL (ref 0.2–1.2)
BUN / CREAT RATIO: 10 (calc) (ref 6–22)
BUN: 33 mg/dL — ABNORMAL HIGH (ref 7–25)
CO2: 18 mmol/L — AB (ref 20–32)
CREATININE: 3.23 mg/dL — AB (ref 0.60–0.88)
Calcium: 8.2 mg/dL — ABNORMAL LOW (ref 8.6–10.4)
Chloride: 108 mmol/L (ref 98–110)
GFR, Est African American: 15 mL/min/{1.73_m2} — ABNORMAL LOW (ref >=60)
GFR, Est Non African American: 13 mL/min/{1.73_m2} — ABNORMAL LOW (ref >=60)
GLOBULIN: 3.2 g/dL (ref 1.9–3.7)
Glucose, Bld: 189 mg/dL — ABNORMAL HIGH (ref 65–99)
Potassium: 3.3 mmol/L — ABNORMAL LOW (ref 3.5–5.3)
SODIUM: 140 mmol/L (ref 135–146)
Total Protein: 6.9 g/dL (ref 6.1–8.1)

## 2019-01-28 LAB — AMYLASE: Amylase: 42 U/L (ref 21–101)

## 2019-01-28 LAB — LIPASE

## 2019-01-28 LAB — HEMOGLOBIN A1C
HEMOGLOBIN A1C: 6.6 %{Hb} — AB (ref <5.7)
Mean Plasma Glucose: 143 (calc)
eAG (mmol/L): 7.9 (calc)

## 2019-02-03 DIAGNOSIS — R69 Illness, unspecified: Secondary | ICD-10-CM | POA: Diagnosis not present

## 2019-02-05 DIAGNOSIS — R69 Illness, unspecified: Secondary | ICD-10-CM | POA: Diagnosis not present

## 2019-02-06 ENCOUNTER — Other Ambulatory Visit: Payer: Self-pay | Admitting: Nurse Practitioner

## 2019-02-06 DIAGNOSIS — I10 Essential (primary) hypertension: Secondary | ICD-10-CM

## 2019-02-07 ENCOUNTER — Other Ambulatory Visit: Payer: Self-pay | Admitting: Nurse Practitioner

## 2019-02-07 NOTE — Telephone Encounter (Signed)
1.) High risk warning populated when attempting to refill Cymbalta, please review  2.) Hydralazine RX last approved by [name], NP on 10/21/18 for #90 yet the instructions were for twice daily. Incoming refill request is for three times daily. Please advise

## 2019-02-07 NOTE — Telephone Encounter (Signed)
Spoke with patient, patient stated she is doing 40 units in the am and 25 units in the pm (as received from pharmacy).  I advised patient that her medication list states that she is to take 40 in the am and 10 units in the pm. Patient aware I will clarify with [name], NP   Please advise on pm dose

## 2019-02-10 ENCOUNTER — Emergency Department (HOSPITAL_COMMUNITY): Payer: Medicare HMO

## 2019-02-10 ENCOUNTER — Encounter: Payer: Self-pay | Admitting: Family

## 2019-02-10 ENCOUNTER — Ambulatory Visit (INDEPENDENT_AMBULATORY_CARE_PROVIDER_SITE_OTHER): Payer: Medicare HMO | Admitting: Family

## 2019-02-10 ENCOUNTER — Ambulatory Visit: Payer: Self-pay

## 2019-02-10 ENCOUNTER — Other Ambulatory Visit: Payer: Self-pay

## 2019-02-10 ENCOUNTER — Inpatient Hospital Stay (HOSPITAL_COMMUNITY)
Admission: EM | Admit: 2019-02-10 | Discharge: 2019-02-15 | DRG: 682 | Disposition: A | Discharge status: Hospice | Payer: Medicare HMO | Attending: Internal Medicine | Admitting: Internal Medicine

## 2019-02-10 ENCOUNTER — Encounter (HOSPITAL_COMMUNITY): Payer: Self-pay

## 2019-02-10 VITALS — BP 122/60 | HR 75 | Temp 97.5°F | Ht 63.0 in | Wt 196.0 lb

## 2019-02-10 DIAGNOSIS — R0602 Shortness of breath: Secondary | ICD-10-CM | POA: Diagnosis not present

## 2019-02-10 DIAGNOSIS — M419 Scoliosis, unspecified: Secondary | ICD-10-CM | POA: Diagnosis present

## 2019-02-10 DIAGNOSIS — I5042 Chronic combined systolic (congestive) and diastolic (congestive) heart failure: Secondary | ICD-10-CM | POA: Diagnosis not present

## 2019-02-10 DIAGNOSIS — Z885 Allergy status to narcotic agent status: Secondary | ICD-10-CM

## 2019-02-10 DIAGNOSIS — R5381 Other malaise: Secondary | ICD-10-CM | POA: Diagnosis not present

## 2019-02-10 DIAGNOSIS — J449 Chronic obstructive pulmonary disease, unspecified: Secondary | ICD-10-CM | POA: Diagnosis not present

## 2019-02-10 DIAGNOSIS — K5730 Diverticulosis of large intestine without perforation or abscess without bleeding: Secondary | ICD-10-CM | POA: Diagnosis present

## 2019-02-10 DIAGNOSIS — G9341 Metabolic encephalopathy: Secondary | ICD-10-CM | POA: Diagnosis present

## 2019-02-10 DIAGNOSIS — D631 Anemia in chronic kidney disease: Secondary | ICD-10-CM | POA: Diagnosis not present

## 2019-02-10 DIAGNOSIS — G4700 Insomnia, unspecified: Secondary | ICD-10-CM | POA: Diagnosis present

## 2019-02-10 DIAGNOSIS — Z880 Allergy status to penicillin: Secondary | ICD-10-CM | POA: Diagnosis not present

## 2019-02-10 DIAGNOSIS — N2581 Secondary hyperparathyroidism of renal origin: Secondary | ICD-10-CM | POA: Diagnosis present

## 2019-02-10 DIAGNOSIS — Z7951 Long term (current) use of inhaled steroids: Secondary | ICD-10-CM

## 2019-02-10 DIAGNOSIS — Z794 Long term (current) use of insulin: Secondary | ICD-10-CM

## 2019-02-10 DIAGNOSIS — Z841 Family history of disorders of kidney and ureter: Secondary | ICD-10-CM

## 2019-02-10 DIAGNOSIS — G4733 Obstructive sleep apnea (adult) (pediatric): Secondary | ICD-10-CM | POA: Diagnosis not present

## 2019-02-10 DIAGNOSIS — Z515 Encounter for palliative care: Secondary | ICD-10-CM | POA: Diagnosis not present

## 2019-02-10 DIAGNOSIS — N184 Chronic kidney disease, stage 4 (severe): Secondary | ICD-10-CM | POA: Diagnosis not present

## 2019-02-10 DIAGNOSIS — Z96643 Presence of artificial hip joint, bilateral: Secondary | ICD-10-CM | POA: Diagnosis present

## 2019-02-10 DIAGNOSIS — K5790 Diverticulosis of intestine, part unspecified, without perforation or abscess without bleeding: Secondary | ICD-10-CM | POA: Diagnosis not present

## 2019-02-10 DIAGNOSIS — Z0000 Encounter for general adult medical examination without abnormal findings: Secondary | ICD-10-CM | POA: Diagnosis not present

## 2019-02-10 DIAGNOSIS — E876 Hypokalemia: Secondary | ICD-10-CM | POA: Diagnosis not present

## 2019-02-10 DIAGNOSIS — Z8249 Family history of ischemic heart disease and other diseases of the circulatory system: Secondary | ICD-10-CM

## 2019-02-10 DIAGNOSIS — E162 Hypoglycemia, unspecified: Secondary | ICD-10-CM | POA: Diagnosis not present

## 2019-02-10 DIAGNOSIS — I132 Hypertensive heart and chronic kidney disease with heart failure and with stage 5 chronic kidney disease, or end stage renal disease: Secondary | ICD-10-CM | POA: Diagnosis not present

## 2019-02-10 DIAGNOSIS — K219 Gastro-esophageal reflux disease without esophagitis: Secondary | ICD-10-CM | POA: Diagnosis present

## 2019-02-10 DIAGNOSIS — E1122 Type 2 diabetes mellitus with diabetic chronic kidney disease: Secondary | ICD-10-CM | POA: Diagnosis present

## 2019-02-10 DIAGNOSIS — R112 Nausea with vomiting, unspecified: Secondary | ICD-10-CM | POA: Diagnosis not present

## 2019-02-10 DIAGNOSIS — Z743 Need for continuous supervision: Secondary | ICD-10-CM | POA: Diagnosis not present

## 2019-02-10 DIAGNOSIS — F419 Anxiety disorder, unspecified: Secondary | ICD-10-CM | POA: Diagnosis present

## 2019-02-10 DIAGNOSIS — R279 Unspecified lack of coordination: Secondary | ICD-10-CM | POA: Diagnosis not present

## 2019-02-10 DIAGNOSIS — Z833 Family history of diabetes mellitus: Secondary | ICD-10-CM

## 2019-02-10 DIAGNOSIS — E872 Acidosis, unspecified: Secondary | ICD-10-CM | POA: Diagnosis present

## 2019-02-10 DIAGNOSIS — Z6837 Body mass index (BMI) 37.0-37.9, adult: Secondary | ICD-10-CM

## 2019-02-10 DIAGNOSIS — N200 Calculus of kidney: Secondary | ICD-10-CM | POA: Diagnosis present

## 2019-02-10 DIAGNOSIS — K449 Diaphragmatic hernia without obstruction or gangrene: Secondary | ICD-10-CM | POA: Diagnosis present

## 2019-02-10 DIAGNOSIS — I272 Pulmonary hypertension, unspecified: Secondary | ICD-10-CM | POA: Diagnosis present

## 2019-02-10 DIAGNOSIS — Z823 Family history of stroke: Secondary | ICD-10-CM

## 2019-02-10 DIAGNOSIS — Z87891 Personal history of nicotine dependence: Secondary | ICD-10-CM

## 2019-02-10 DIAGNOSIS — Z9119 Patient's noncompliance with other medical treatment and regimen: Secondary | ICD-10-CM | POA: Diagnosis not present

## 2019-02-10 DIAGNOSIS — E1121 Type 2 diabetes mellitus with diabetic nephropathy: Secondary | ICD-10-CM | POA: Diagnosis not present

## 2019-02-10 DIAGNOSIS — R1111 Vomiting without nausea: Secondary | ICD-10-CM | POA: Diagnosis not present

## 2019-02-10 DIAGNOSIS — I10 Essential (primary) hypertension: Secondary | ICD-10-CM | POA: Diagnosis present

## 2019-02-10 DIAGNOSIS — D72829 Elevated white blood cell count, unspecified: Secondary | ICD-10-CM | POA: Diagnosis present

## 2019-02-10 DIAGNOSIS — Z66 Do not resuscitate: Secondary | ICD-10-CM | POA: Diagnosis present

## 2019-02-10 DIAGNOSIS — M1990 Unspecified osteoarthritis, unspecified site: Secondary | ICD-10-CM | POA: Diagnosis present

## 2019-02-10 DIAGNOSIS — K5909 Other constipation: Secondary | ICD-10-CM | POA: Diagnosis present

## 2019-02-10 DIAGNOSIS — R079 Chest pain, unspecified: Secondary | ICD-10-CM | POA: Diagnosis not present

## 2019-02-10 DIAGNOSIS — Z5320 Procedure and treatment not carried out because of patient's decision for unspecified reasons: Secondary | ICD-10-CM | POA: Diagnosis present

## 2019-02-10 DIAGNOSIS — N185 Chronic kidney disease, stage 5: Secondary | ICD-10-CM | POA: Diagnosis not present

## 2019-02-10 DIAGNOSIS — I509 Heart failure, unspecified: Secondary | ICD-10-CM | POA: Diagnosis not present

## 2019-02-10 DIAGNOSIS — N179 Acute kidney failure, unspecified: Secondary | ICD-10-CM | POA: Diagnosis not present

## 2019-02-10 DIAGNOSIS — R1030 Lower abdominal pain, unspecified: Secondary | ICD-10-CM | POA: Diagnosis not present

## 2019-02-10 DIAGNOSIS — R1084 Generalized abdominal pain: Secondary | ICD-10-CM | POA: Diagnosis not present

## 2019-02-10 DIAGNOSIS — J9811 Atelectasis: Secondary | ICD-10-CM | POA: Diagnosis present

## 2019-02-10 DIAGNOSIS — Z7189 Other specified counseling: Secondary | ICD-10-CM

## 2019-02-10 DIAGNOSIS — Z79899 Other long term (current) drug therapy: Secondary | ICD-10-CM

## 2019-02-10 DIAGNOSIS — I130 Hypertensive heart and chronic kidney disease with heart failure and stage 1 through stage 4 chronic kidney disease, or unspecified chronic kidney disease: Secondary | ICD-10-CM | POA: Diagnosis not present

## 2019-02-10 DIAGNOSIS — Z9981 Dependence on supplemental oxygen: Secondary | ICD-10-CM

## 2019-02-10 DIAGNOSIS — Z90710 Acquired absence of both cervix and uterus: Secondary | ICD-10-CM

## 2019-02-10 DIAGNOSIS — E785 Hyperlipidemia, unspecified: Secondary | ICD-10-CM | POA: Diagnosis present

## 2019-02-10 DIAGNOSIS — Z7982 Long term (current) use of aspirin: Secondary | ICD-10-CM

## 2019-02-10 DIAGNOSIS — K828 Other specified diseases of gallbladder: Secondary | ICD-10-CM | POA: Diagnosis present

## 2019-02-10 DIAGNOSIS — G8929 Other chronic pain: Secondary | ICD-10-CM | POA: Diagnosis present

## 2019-02-10 DIAGNOSIS — G609 Hereditary and idiopathic neuropathy, unspecified: Secondary | ICD-10-CM | POA: Diagnosis present

## 2019-02-10 DIAGNOSIS — Z888 Allergy status to other drugs, medicaments and biological substances status: Secondary | ICD-10-CM

## 2019-02-10 DIAGNOSIS — F329 Major depressive disorder, single episode, unspecified: Secondary | ICD-10-CM | POA: Diagnosis present

## 2019-02-10 DIAGNOSIS — E11649 Type 2 diabetes mellitus with hypoglycemia without coma: Secondary | ICD-10-CM | POA: Diagnosis not present

## 2019-02-10 DIAGNOSIS — R0989 Other specified symptoms and signs involving the circulatory and respiratory systems: Secondary | ICD-10-CM | POA: Diagnosis not present

## 2019-02-10 LAB — CBG MONITORING, ED
Glucose-Capillary: 83 mg/dL (ref 70–99)
Glucose-Capillary: 62 mg/dL — ABNORMAL LOW (ref 70–99)
Glucose-Capillary: 65 mg/dL — ABNORMAL LOW (ref 70–99)
Glucose-Capillary: 71 mg/dL (ref 70–99)

## 2019-02-10 LAB — CBC
HCT: 33.8 % — ABNORMAL LOW (ref 36.0–46.0)
HCT: 32.5 % — ABNORMAL LOW (ref 36.0–46.0)
Hemoglobin: 10.6 g/dL — ABNORMAL LOW (ref 12.0–15.0)
Hemoglobin: 10.4 g/dL — ABNORMAL LOW (ref 12.0–15.0)
MCH: 27 pg (ref 26.0–34.0)
MCH: 26.6 pg (ref 26.0–34.0)
MCHC: 31.4 g/dL (ref 30.0–36.0)
MCHC: 32 g/dL (ref 30.0–36.0)
MCV: 86.2 fL (ref 80.0–100.0)
MCV: 83.1 fL (ref 80.0–100.0)
Platelets: 364 10*3/uL (ref 150–400)
Platelets: 295 10*3/uL (ref 150–400)
RBC: 3.92 MIL/uL (ref 3.87–5.11)
RBC: 3.91 MIL/uL (ref 3.87–5.11)
RDW: 16.4 % — AB (ref 11.5–15.5)
RDW: 16.2 % — ABNORMAL HIGH (ref 11.5–15.5)
WBC: 12.2 10*3/uL — ABNORMAL HIGH (ref 4.0–10.5)
WBC: 10.6 10*3/uL — AB (ref 4.0–10.5)
nRBC: 0.4 % — ABNORMAL HIGH (ref 0.0–0.2)
nRBC: 0.2 % (ref 0.0–0.2)

## 2019-02-10 LAB — POCT I-STAT EG7
Acid-base deficit: 14 mmol/L — ABNORMAL HIGH (ref 0.0–2.0)
Bicarbonate: 13.3 mmol/L — ABNORMAL LOW (ref 20.0–28.0)
Calcium, Ion: 0.94 mmol/L — ABNORMAL LOW (ref 1.15–1.40)
HCT: 32 % — ABNORMAL LOW (ref 36.0–46.0)
Hemoglobin: 10.9 g/dL — ABNORMAL LOW (ref 12.0–15.0)
O2 Saturation: 99 %
PO2 VEN: 147 mmHg — AB (ref 32.0–45.0)
Potassium: 3.8 mmol/L (ref 3.5–5.1)
Sodium: 137 mmol/L (ref 135–145)
TCO2: 14 mmol/L — ABNORMAL LOW (ref 22–32)
pCO2, Ven: 33.5 mmHg — ABNORMAL LOW (ref 44.0–60.0)
pH, Ven: 7.208 — ABNORMAL LOW (ref 7.250–7.430)

## 2019-02-10 LAB — GLUCOSE, POCT (MANUAL RESULT ENTRY)
POC GLUCOSE: 88 mg/dL (ref 70–99)
POC Glucose: 49 mg/dl — AB (ref 70–99)
POC Glucose: 69 mg/dl — AB (ref 70–99)

## 2019-02-10 LAB — CREATININE, SERUM
CREATININE: 5.31 mg/dL — AB (ref 0.44–1.00)
GFR calc Af Amer: 8 mL/min — ABNORMAL LOW (ref >60)
GFR, EST NON AFRICAN AMERICAN: 7 mL/min — AB (ref >60)

## 2019-02-10 LAB — COMPREHENSIVE METABOLIC PANEL
ALK PHOS: 133 U/L — AB (ref 38–126)
ALT: 10 U/L (ref 0–44)
AST: 20 U/L (ref 15–41)
Albumin: 3.1 g/dL — ABNORMAL LOW (ref 3.5–5.0)
Anion gap: 9 (ref 5–15)
BUN: 49 mg/dL — ABNORMAL HIGH (ref 8–23)
CO2: 14 mmol/L — AB (ref 22–32)
CREATININE: 5.66 mg/dL — AB (ref 0.44–1.00)
Calcium: 7.4 mg/dL — ABNORMAL LOW (ref 8.9–10.3)
Chloride: 112 mmol/L — ABNORMAL HIGH (ref 98–111)
GFR calc Af Amer: 8 mL/min — ABNORMAL LOW (ref >60)
GFR calc non Af Amer: 7 mL/min — ABNORMAL LOW (ref >60)
Glucose, Bld: 88 mg/dL (ref 70–99)
Potassium: 3.6 mmol/L (ref 3.5–5.1)
SODIUM: 135 mmol/L (ref 135–145)
Total Bilirubin: 0.5 mg/dL (ref 0.3–1.2)
Total Protein: 7 g/dL (ref 6.5–8.1)

## 2019-02-10 LAB — LACTIC ACID, PLASMA
Lactic Acid, Venous: 0.7 mmol/L (ref 0.5–1.9)
Lactic Acid, Venous: 1 mmol/L (ref 0.5–1.9)

## 2019-02-10 LAB — LIPASE, BLOOD: Lipase: 24 U/L (ref 11–51)

## 2019-02-10 LAB — GLUCOSE, CAPILLARY
Glucose-Capillary: 50 mg/dL — ABNORMAL LOW (ref 70–99)
Glucose-Capillary: 75 mg/dL (ref 70–99)

## 2019-02-10 LAB — I-STAT TROPONIN, ED: Troponin i, poc: 0.02 ng/mL (ref 0.00–0.08)

## 2019-02-10 MED ORDER — ONDANSETRON HCL 4 MG PO TABS: 4.0000 mg | ORAL_TABLET | Freq: Four times a day (QID) | ORAL | Status: DC | PRN

## 2019-02-10 MED ORDER — ONDANSETRON HCL 4 MG/2ML IJ SOLN
4.0000 mg | Freq: Four times a day (QID) | INTRAMUSCULAR | Status: DC | PRN
  Administered 2019-02-11: 4 mg via INTRAVENOUS
  Filled 2019-02-10: qty 2

## 2019-02-10 MED ORDER — FENTANYL CITRATE (PF) 100 MCG/2ML IJ SOLN
25.0000 ug | Freq: Once | INTRAMUSCULAR | Status: AC
  Administered 2019-02-10: 25 ug via INTRAVENOUS
  Filled 2019-02-10: qty 2

## 2019-02-10 MED ORDER — SODIUM CHLORIDE 0.9 % IV BOLUS
1000.0000 mL | Freq: Once | INTRAVENOUS | Status: AC
  Administered 2019-02-10: 1000 mL via INTRAVENOUS

## 2019-02-10 MED ORDER — SODIUM CHLORIDE 0.9% FLUSH: 3.0000 mL | Freq: Once | INTRAVENOUS | Status: DC

## 2019-02-10 MED ORDER — MORPHINE SULFATE (PF) 2 MG/ML IV SOLN
2.0000 mg | Freq: Once | INTRAVENOUS | Status: AC
  Administered 2019-02-10: 2 mg via INTRAVENOUS
  Filled 2019-02-10: qty 1

## 2019-02-10 MED ORDER — INSULIN ASPART 100 UNIT/ML ~~LOC~~ SOLN
0.0000 [IU] | Freq: Three times a day (TID) | SUBCUTANEOUS | Status: DC
  Administered 2019-02-12 – 2019-02-13 (×2): 2 [IU] via SUBCUTANEOUS
  Administered 2019-02-14: 3 [IU] via SUBCUTANEOUS

## 2019-02-10 MED ORDER — SODIUM CHLORIDE 0.9% FLUSH
3.0000 mL | Freq: Two times a day (BID) | INTRAVENOUS | Status: DC
  Administered 2019-02-10 – 2019-02-15 (×6): 3 mL via INTRAVENOUS

## 2019-02-10 MED ORDER — HEPARIN SODIUM (PORCINE) 5000 UNIT/ML IJ SOLN
5000.0000 [IU] | Freq: Three times a day (TID) | INTRAMUSCULAR | Status: DC
  Administered 2019-02-10 – 2019-02-14 (×9): 5000 [IU] via SUBCUTANEOUS
  Filled 2019-02-10 (×9): qty 1

## 2019-02-10 MED ORDER — INSULIN ASPART 100 UNIT/ML ~~LOC~~ SOLN: 0.0000 [IU] | Freq: Every day | SUBCUTANEOUS | Status: DC

## 2019-02-10 MED ORDER — ONDANSETRON HCL 4 MG/2ML IJ SOLN
4.0000 mg | Freq: Once | INTRAMUSCULAR | Status: AC
  Administered 2019-02-10: 4 mg via INTRAVENOUS
  Filled 2019-02-10: qty 2

## 2019-02-10 MED ORDER — SODIUM CHLORIDE 0.9 % IV SOLN
INTRAVENOUS | Status: DC
  Administered 2019-02-10 – 2019-02-12 (×3): via INTRAVENOUS

## 2019-02-10 NOTE — ED Notes (Signed)
Pt given orange juice per RN, Jenny Reichmann R.

## 2019-02-10 NOTE — ED Provider Notes (Signed)
[facility] EMERGENCY DEPARTMENT Provider Note   CSN: [id number] Arrival date & time: 02/10/19  1257    History   Chief Complaint Chief Complaint  Patient presents with   Abdominal Pain    HPI Michelle Buck is a 83 y.o. female.     HPI   83 year old female with a history of diabetes, COPD, congestive heart failure, diaphragmatic hernia, hypertension, hyperlipidemia, CKD, OSA, presents with concern for abdominal pain, nausea and vomiting.  Patient reports she is had abdominal pain for 2 weeks, with associated nausea and vomiting.  Ports symptoms began as low appetite, nausea, inability to eat or drink, and that she had associated lower abdominal pain.  Reports constant dull abdominal pain rated a 7 out of 10 currently.  Does report the lower abdominal pain worsens with eating.  Reports the nausea and vomiting also increases with eating.  Reports she does not have desire to eat much, however when she does eat, she is thrown it up.  Denies urinary symptoms, fever.  Reports chronic cough and dyspnea when she lays down flat which is unchanged.  Reports intermittent episodes of chest pain on review of systems, located on the right side but not current at this moment.  Reports the pain improves with pain medications.  Reports she does take prescribed laxatives for chronic constipation, and has been able to have bowel movements.  Past Medical History:  Diagnosis Date   Acute bronchitis    Anxiety    Arthritis    Chronic kidney disease, stage II (mild)    Complications affecting other specified body systems, hypertension    Congestive heart failure, unspecified    stress test in March at [facility]   COPD (chronic obstructive pulmonary disease) ([place])    present for several years, diagnosed in the last few years   Degenerative arthritis of knee, bilateral    Diabetes mellitus    Diaphragmatic hernia without mention of obstruction or gangrene     Disorder of bone and cartilage, unspecified    GERD (gastroesophageal reflux disease)    Hiatal hernia    Hyperlipidemia    Hypertension    Hypopotassemia    Insomnia, unspecified    Lumbago    Obese    Osteoarthrosis, unspecified whether generalized or localized, unspecified site    PONV (postoperative nausea and vomiting)    Scoliosis    Secondary diabetes mellitus with renal manifestations, not stated as uncontrolled, or unspecified(249.40)    Unspecified glaucoma(365.9)    Unspecified hereditary and idiopathic peripheral neuropathy     Patient Active Problem List   Diagnosis Date Noted   Acute renal failure superimposed on stage 4 chronic kidney disease ([place]) 26/94/8546   Metabolic acidosis 27/01/5008   Hiatal hernia    Poor dentition    Dysphagia 05/05/2018   Hypercapnia 05/06/2017   Osteoarthritis 03/25/2017   Shoulder pain 03/25/2017   Abnormality of gait 03/25/2017   COPD (chronic obstructive pulmonary disease) ([place]) 38/18/2993   Diastolic CHF ([place]) 71/69/6789   Chest pressure 09/25/2016   Dyspnea and respiratory abnormality 09/23/2016   OSA (obstructive sleep apnea) 09/02/2016   Other fatigue 06/23/2016   PAD (peripheral artery disease) ([place]) 05/21/2016   Glaucoma 05/21/2016   Bilateral edema of lower extremity    Leg edema 01/01/2016   CKD stage 3 due to type 2 diabetes mellitus (HCC)    CHF (congestive heart failure) ([place]) 10/29/2015   Chronic obstructive pulmonary disease ([place]) 10/29/2015   Lumbago  CKD (chronic kidney disease)    Chronic kidney disease (CKD), stage IV (severe) ([place]) 09/11/2015   Diabetes mellitus with diabetic nephropathy, with long-term current use of insulin ([place]) 09/11/2015   Anemia of chronic kidney failure 09/11/2015   Morbid obesity ([place]) 08/14/2015   Peripheral autonomic neuropathy due to diabetes mellitus ([place]) 10/24/2013   Essential hypertension, benign 02/27/2013   Acute on chronic  diastolic congestive heart failure ([place]) 03/02/2012   Diabetes mellitus type 2 in obese ([place]) 03/02/2012    Past Surgical History:  Procedure Laterality Date   ABDOMINAL HYSTERECTOMY     COLONOSCOPY  08/06/2011   ESOPHAGOGASTRODUODENOSCOPY (EGD) WITH PROPOFOL N/A 05/26/2018   Procedure: ESOPHAGOGASTRODUODENOSCOPY (EGD) WITH PROPOFOL;  Surgeon: Milus Banister, MD;  Location: [facility] END[facility]COPY;  Service: Endoscopy;  Laterality: N/A;   FOOT SURGERY     KNEE ARTHROSCOPY     bilateral   SHOULDER SURGERY     TOTAL HIP ARTHROPLASTY     bilateral     OB History   No obstetric history on file.      Home Medications    Prior to Admission medications   Medication Sig Start Date End Date Taking? Authorizing Provider  aspirin EC 81 MG tablet Take 1 tablet (81 mg total) by mouth daily. 01/27/19   [name], NP  budesonide (PULMICORT) 0.25 MG/2ML nebulizer solution USE 1 AMPULE IN NEBULIZER TWICE DAILY ROUTINELY FOR COPD 05/13/18   Tanda Rockers, MD  cilostazol (PLETAL) 100 MG tablet TAKE 1 TABLET BY MOUTH ONCE DAILY 02/07/19   [name], NP  cycloSPORINE (RESTASIS) 0.05 % ophthalmic emulsion Place 1 drop into both eyes 2 (two) times daily.    [provider]  DULoxetine (CYMBALTA) 30 MG capsule TAKE 1 CAPSULE BY MOUTH EVERY DAY TO HELP ANXIETY AND PAINS. 02/07/19   [name], NP  esomeprazole (NEXIUM) 40 MG capsule TAKE 1 CAPSULE BY MOUTH ONCE DAILY 02/07/19   [name], NP  hydrALAZINE (APRESOLINE) 50 MG tablet TAKE 1 TABLET BY MOUTH THREE TIMES A DAY 02/07/19   [name], NP  HYDROcodone-acetaminophen (NORCO) 10-325 MG tablet Take 1 tablet by mouth every 6 (six) hours as needed. 01/27/19   [name], NP  insulin aspart protamine- aspart (NOVOLOG MIX 70/30) (70-30) 100 UNIT/ML injection INJECT 40 UNITS SUBCUTANEOUSLY WITH BREAKFAST AND INJECT 25 UNITS SUBCUTANEOUSLY WITH DINNER TO CONTROL BLOOD SUGAR. 02/07/19   [name], NP    lubiprostone (AMITIZA) 24 MCG capsule Take 1 capsule (24 mcg total) by mouth 2 (two) times daily. 11/01/18   [name], NP  metoprolol succinate (TOPROL-XL) 25 MG 24 hr tablet TAKE 1 TABLET BY MOUTH ONCE DAILY WITH OR IMMEDIATELY FOLLOWING A MEAL FOR BLOOD PRESSURE 02/07/19   [name], NP  potassium chloride SA (K-DUR,KLOR-CON) 20 MEQ tablet Take 2 tablets (40 mEq total) by mouth daily. 12/06/18   [name], NP  simvastatin (ZOCOR) 40 MG tablet Take 1 tablet (40 mg total) by mouth daily. 10/21/18   [name], NP  sucralfate (CARAFATE) 1 g tablet TAKE 1 TABLET BY MOUTH TWICE DAILY ON EMPTY STOMACH 02/07/19   [name], NP  timolol (TIMOPTIC) 0.5 % ophthalmic solution INSTILL 1 DROP INTO EACH EYE TWICE A DAY 02/12/16   [name], NP  TRAVATAN Z 0.004 % SOLN ophthalmic solution Place 1 drop into both eyes at bedtime.  04/09/16   [provider]    Family History  Family History  Problem Relation Age of Onset   Diabetes Mother    Heart disease Mother    Heart disease Brother    Diabetes Brother    Kidney disease Father    Scoliosis Sister    Diabetes Brother    Diabetes Brother    Heart disease Brother    Scoliosis Brother    Stroke Brother    Heart attack Brother     Social History Social History   Tobacco Use   Smoking status: Former Smoker    Packs/day: 0.50    Years: 3.00    Pack years: 1.50    Types: Cigarettes    Last attempt to quit: 02/27/1997    Years since quitting: 21.9   Smokeless tobacco: Never Used   Tobacco comment: remote history - stopped 19 years ago; 20 pack year history  Substance Use Topics   Alcohol use: No    Alcohol/week: 0.0 standard drinks   Drug use: No     Allergies   Fluticasone-salmeterol; Tramadol; Codeine; Hydrocodone; Penicillins; and Tylenol [acetaminophen]   Review of Systems Review of Systems  Constitutional: Negative for fever.  HENT: Negative for sore  throat.   Eyes: Negative for visual disturbance.  Respiratory: Negative for cough and shortness of breath.   Cardiovascular: Positive for chest pain.  Gastrointestinal: Positive for abdominal pain.  Genitourinary: Negative for difficulty urinating.  Musculoskeletal: Negative for back pain and neck pain.  Skin: Negative for rash.  Neurological: Negative for syncope and headaches.     Physical Exam Updated Vital Signs BP (!) 128/53 (BP Location: Right Arm)    Pulse 79    Temp (!) 97.4 F (36.3 C) (Oral)    Resp 18    Ht 5\' 4"  (1.626 m)    Wt 96.5 kg    SpO2 99%    BMI 36.53 kg/m   Physical Exam Vitals signs and nursing note reviewed.  Constitutional:      General: She is not in acute distress.    Appearance: She is well-developed. She is not diaphoretic.  HENT:     Head: Normocephalic and atraumatic.  Eyes:     Conjunctiva/sclera: Conjunctivae normal.  Neck:     Musculoskeletal: Normal range of motion.  Cardiovascular:     Rate and Rhythm: Normal rate and regular rhythm.     Heart sounds: Normal heart sounds. No murmur. No friction rub. No gallop.   Pulmonary:     Effort: Pulmonary effort is normal. No respiratory distress.     Breath sounds: Normal breath sounds. No wheezing or rales.  Abdominal:     General: There is no distension.     Palpations: Abdomen is soft.     Tenderness: There is abdominal tenderness in the right lower quadrant, suprapubic area and left lower quadrant. There is no guarding.  Musculoskeletal:        General: No tenderness.  Skin:    General: Skin is warm and dry.     Findings: No erythema or rash.  Neurological:     Mental Status: She is alert and oriented to person, place, and time.      ED Treatments / Results  Labs (all labs ordered are listed, but only abnormal results are displayed) Labs Reviewed  COMPREHENSIVE METABOLIC PANEL - Abnormal; Notable for the following components:      Result Value   Chloride 112 (*)    CO2 14 (*)     BUN 49 (*)    Creatinine,  Ser 5.66 (*)    Calcium 7.4 (*)    Albumin 3.1 (*)    Alkaline Phosphatase 133 (*)    GFR calc non Af Amer 7 (*)    GFR calc Af Amer 8 (*)    All other components within normal limits  CBC - Abnormal; Notable for the following components:   WBC 12.2 (*)    Hemoglobin 10.6 (*)    HCT 33.8 (*)    RDW 16.4 (*)    nRBC 0.4 (*)    All other components within normal limits  CBC - Abnormal; Notable for the following components:   WBC 10.6 (*)    Hemoglobin 10.4 (*)    HCT 32.5 (*)    RDW 16.2 (*)    All other components within normal limits  CREATININE, SERUM - Abnormal; Notable for the following components:   Creatinine, Ser 5.31 (*)    GFR calc non Af Amer 7 (*)    GFR calc Af Amer 8 (*)    All other components within normal limits  GLUCOSE, CAPILLARY - Abnormal; Notable for the following components:   Glucose-Capillary 50 (*)    All other components within normal limits  CBG MONITORING, ED - Abnormal; Notable for the following components:   Glucose-Capillary 62 (*)    All other components within normal limits  CBG MONITORING, ED - Abnormal; Notable for the following components:   Glucose-Capillary 65 (*)    All other components within normal limits  POCT I-STAT EG7 - Abnormal; Notable for the following components:   pH, Ven 7.208 (*)    pCO2, Ven 33.5 (*)    pO2, Ven 147.0 (*)    Bicarbonate 13.3 (*)    TCO2 14 (*)    Acid-base deficit 14.0 (*)    Calcium, Ion 0.94 (*)    HCT 32.0 (*)    Hemoglobin 10.9 (*)    All other components within normal limits  URINE CULTURE  C DIFFICILE QUICK SCREEN W PCR REFLEX  GASTROINTESTINAL PANEL BY PCR, STOOL (REPLACES STOOL CULTURE)  LIPASE, BLOOD  LACTIC ACID, PLASMA  LACTIC ACID, PLASMA  GLUCOSE, CAPILLARY  URINALYSIS, ROUTINE W REFLEX MICROSCOPIC  BASIC METABOLIC PANEL  CBC  CBG MONITORING, ED  I-STAT TROPONIN, ED  CBG MONITORING, ED    EKG None  Radiology Ct Abdomen Pelvis Wo  Contrast  Result Date: 02/10/2019 CLINICAL DATA:  Nausea and vomiting for 1 week. Lower abdominal pain and poor appetite. EXAM: CT ABDOMEN AND PELVIS WITHOUT CONTRAST TECHNIQUE: Multidetector CT imaging of the abdomen and pelvis was performed following the standard protocol without IV contrast. COMPARISON:  CT scan 05/03/2017 FINDINGS: Lower chest: Streaky bibasilar atelectasis but no infiltrates or effusions. Coronary artery calcifications are noted. There is a large hiatal hernia. Hepatobiliary: No definite hepatic lesions without contrast. The gallbladder is grossly normal. No common bile duct dilatation. Pancreas: Significant fatty atrophy but no mass or ductal dilatation. Spleen: Normal size.  No focal liver lesions. Adrenals/Urinary Tract: Stable left adrenal gland lesions consistent with benign adenomas. The right adrenal gland is unremarkable. Persistent right renal calculi. The largest calculus measures 18 mm. No hydronephrosis or obstructing ureteral calculi. No left-sided renal or ureteral calculi. No bladder calculi or obvious bladder mass. Stomach/Bowel: The stomach, duodenum, small bowel and colon are grossly normal without oral contrast. Significant sigmoid colon diverticulosis but no definite findings for acute diverticulitis. There are some scattered air-fluid levels in the transverse colon which can be seen with diarrhea. The terminal  ileum appears normal. Vascular/Lymphatic: Advanced atherosclerotic calcifications involving the aorta and iliac arteries. No aneurysm. No mesenteric or retroperitoneal mass or adenopathy. Small scattered lymph nodes are noted. Reproductive: Surgically absent. Other: Examination of the lower pelvis is somewhat limited by bilateral hip prosthesis. Musculoskeletal: Severe scoliosis and degenerative lumbar spondylosis but no acute bony findings. IMPRESSION: 1. Right renal calculi but no obstructing ureteral calculi and no definite renal or bladder mass. 2. Large hiatal  hernia. 3. No acute abdominal/pelvic findings, mass lesions or lymphadenopathy. 4. Sigmoid colon diverticulosis without definite findings for acute diverticulitis. Electronically Signed   By: Marijo Sanes M.D.   On: 02/10/2019 18:04    Procedures Procedures (including critical care time)  Medications Ordered in ED Medications  sodium chloride flush (NS) 0.9 % injection 3 mL (has no administration in time range)  heparin injection 5,000 Units (5,000 Units Subcutaneous Given 02/10/19 2223)  sodium chloride flush (NS) 0.9 % injection 3 mL (3 mLs Intravenous Given 02/10/19 2225)  0.9 %  sodium chloride infusion ( Intravenous New Bag/Given 02/10/19 2222)  ondansetron (ZOFRAN) tablet 4 mg (has no administration in time range)    Or  ondansetron (ZOFRAN) injection 4 mg (has no administration in time range)  insulin aspart (novoLOG) injection 0-15 Units (has no administration in time range)  insulin aspart (novoLOG) injection 0-5 Units (0 Units Subcutaneous Not Given 02/10/19 2222)  fentaNYL (SUBLIMAZE) injection 25 mcg (25 mcg Intravenous Given 02/10/19 1546)  ondansetron (ZOFRAN) injection 4 mg (4 mg Intravenous Given 02/10/19 1546)  sodium chloride 0.9 % bolus 1,000 mL (0 mLs Intravenous Stopped 02/10/19 1840)  morphine 2 MG/ML injection 2 mg (2 mg Intravenous Given 02/10/19 2236)     Initial Impression / Assessment and Plan / ED Course  I have reviewed the triage vital signs and the nursing notes.  Pertinent labs & imaging results that were available during my care of the patient were reviewed by me and considered in my medical decision making (see chart for details).        83 year old female with a history of diabetes, COPD, congestive heart failure, diaphragmatic hernia, hypertension, hyperlipidemia, CKD, OSA, presents with concern for abdominal pain, nausea and vomiting.  Differential diagnosis includes diabetic ketoacidosis, small bowel obstruction, diverticulitis, gastroenteritis,  gastroparesis.  Labs are significant for acute on chronic kidney disease with a creatinine which has increased from 3.23 on February 20 8th-5.66 today.  Her bicarb is 14.  Labs show no evidence of transaminitis, pancreatitis.   Because of acute kidney injury, patient was given IV fluids.  Labs show non-anion gap metabolic acidosis, likely in the setting of dehydration.   CT abdomen pelvis done shows no acute abnormalities.  Will admit for acute on chronic kidney disease, metabolic acidosis.   Final Clinical Impressions(s) / ED Diagnoses   Final diagnoses:  Acute kidney injury [facility])  Metabolic acidosis    ED Discharge Orders    None       Gareth Morgan, MD 02/11/19 [phone]

## 2019-02-10 NOTE — ED Triage Notes (Signed)
[facility]- pt here from PCP office with complaint of n/v and abdominal pain X1 week. Pt is alert and oriented. Skin warm and dry. She states she has had decreased appetite.

## 2019-02-10 NOTE — Progress Notes (Signed)
Provider: Shashwat Cleary FNP-C  [name], NP  Patient Care Team: [name], NP as PCP - General (Nurse Practitioner) [name], MD as Consulting Physician (Ophthalmology) [name], MD as Consulting Physician (Nephrology)  Extended Emergency Contact Information Primary Emergency Contact: [place] Address: [address]          [place], [place] [address] Johnnette Litter of [place] Phone: [phone] Work Phone: [phone] Mobile Phone: [phone] Relation: Sister Secondary Emergency Contact: Westly Pam, [place] [address] Johnnette Litter of [place] Phone: [phone] Relation: Sister  Goals of care: Advanced Directive information Advanced Directives 02/10/2019  Does Patient Have a Medical Advance Directive? Yes  Type of Advance Directive Out of facility DNR (pink MOST or yellow form)  Does patient want to make changes to medical advance directive? No - Patient declined  Copy of [place] in Chart? -  Would patient like information on creating a medical advance directive? -  Pre-existing out of facility DNR order (yellow form or pink MOST form) Yellow form placed in chart (order not valid for inpatient use)     Chief Complaint  Patient presents with   Acute Visit    Patient c/o nausea, upset stomach, and throwing up since Saturday.     HPI:  Pt is a 83 y.o. female seen today for an acute visit for evaluation of nausea and vomiting x 1 week.she is here with her son who states lives across the street from her house but patient lives alone.she has a Nurse assistance who comes and assist her with her ADL's.she complains of lower abdominal pain and poor appetite.she denies fever,chills ,constipation or diarrhea.she brought her  CBG log readings in the 130's-180's with x 2 readings above 200's. During visit she was diaphoretic and confused.CBG checked was 49 orange juice and peanut butter  crackers given CBG rechecked was 69 had more crackers and juices CBG went up to 88.patient became more alert.Attmepted to collect urine specimen for urine analysis but patient was unable to urinate despite drinking a glass of water. Of note she was here for her annual wellness visit. Her recent K+ 3.3 ( 01/27/2019) here for recheck of BMP.   Past Medical History:  Diagnosis Date   Acute bronchitis    Anxiety    Arthritis    Chronic kidney disease, stage II (mild)    Complications affecting other specified body systems, hypertension    Congestive heart failure, unspecified    stress test in March at [facility]   COPD (chronic obstructive pulmonary disease) ([place])    present for several years, diagnosed in the last few years   Degenerative arthritis of knee, bilateral    Diabetes mellitus    Diaphragmatic hernia without mention of obstruction or gangrene    Disorder of bone and cartilage, unspecified    GERD (gastroesophageal reflux disease)    Hiatal hernia    Hyperlipidemia    Hypertension    Hypopotassemia    Insomnia, unspecified    Lumbago    Obese    Osteoarthrosis, unspecified whether generalized or localized, unspecified site    PONV (postoperative nausea and vomiting)    Scoliosis    Secondary diabetes mellitus with renal manifestations, not stated as uncontrolled, or unspecified(249.40)    Unspecified glaucoma(365.9)    Unspecified hereditary and idiopathic peripheral neuropathy    Past Surgical History:  Procedure Laterality Date   ABDOMINAL HYSTERECTOMY  COLONOSCOPY  08/06/2011   ESOPHAGOGASTRODUODENOSCOPY (EGD) WITH PROPOFOL N/A 05/26/2018   Procedure: ESOPHAGOGASTRODUODENOSCOPY (EGD) WITH PROPOFOL;  Surgeon: Milus Banister, MD;  Location: [facility];  Service: Endoscopy;  Laterality: N/A;   FOOT SURGERY     KNEE ARTHROSCOPY     bilateral   SHOULDER SURGERY     TOTAL HIP ARTHROPLASTY     bilateral    Allergies    Allergen Reactions   Fluticasone-Salmeterol     numbness   Tramadol Other (See Comments)    Leg cramps    Codeine Nausea And Vomiting and Other (See Comments)    Patient states N/V with codeine   Hydrocodone Nausea And Vomiting   Penicillins Rash and Other (See Comments)    Patient states rash/itch with penicillin Has patient had a PCN reaction causing immediate rash, facial/tongue/throat swelling, SOB or lightheadedness with hypotension: Yes- broke me out and i was itching Has patient had a PCN reaction causing severe rash involving mucus membranes or skin necrosis: No Has patient had a PCN reaction that required hospitalization Yes- i was already in the hospital Has patient had a PCN reaction occurring within the last 10 years: No If all of the above answers are "NO   Tylenol [Acetaminophen] Hives    Outpatient Encounter Medications as of 02/10/2019  Medication Sig   aspirin EC 81 MG tablet Take 1 tablet (81 mg total) by mouth daily.   budesonide (PULMICORT) 0.25 MG/2ML nebulizer solution USE 1 AMPULE IN NEBULIZER TWICE DAILY ROUTINELY FOR COPD   cilostazol (PLETAL) 100 MG tablet TAKE 1 TABLET BY MOUTH ONCE DAILY   cycloSPORINE (RESTASIS) 0.05 % ophthalmic emulsion Place 1 drop into both eyes 2 (two) times daily.   DULoxetine (CYMBALTA) 30 MG capsule TAKE 1 CAPSULE BY MOUTH EVERY DAY TO HELP ANXIETY AND PAINS.   esomeprazole (NEXIUM) 40 MG capsule TAKE 1 CAPSULE BY MOUTH ONCE DAILY   hydrALAZINE (APRESOLINE) 50 MG tablet TAKE 1 TABLET BY MOUTH THREE TIMES A DAY   HYDROcodone-acetaminophen (NORCO) 10-325 MG tablet Take 1 tablet by mouth every 6 (six) hours as needed.   insulin aspart protamine- aspart (NOVOLOG MIX 70/30) (70-30) 100 UNIT/ML injection INJECT 40 UNITS SUBCUTANEOUSLY WITH BREAKFAST AND INJECT 25 UNITS SUBCUTANEOUSLY WITH DINNER TO CONTROL BLOOD SUGAR.   lubiprostone (AMITIZA) 24 MCG capsule Take 1 capsule (24 mcg total) by mouth 2 (two) times daily.    metoprolol succinate (TOPROL-XL) 25 MG 24 hr tablet TAKE 1 TABLET BY MOUTH ONCE DAILY WITH OR IMMEDIATELY FOLLOWING A MEAL FOR BLOOD PRESSURE   potassium chloride SA (K-DUR,KLOR-CON) 20 MEQ tablet Take 2 tablets (40 mEq total) by mouth daily.   simvastatin (ZOCOR) 40 MG tablet Take 1 tablet (40 mg total) by mouth daily.   sucralfate (CARAFATE) 1 g tablet TAKE 1 TABLET BY MOUTH TWICE DAILY ON EMPTY STOMACH   timolol (TIMOPTIC) 0.5 % ophthalmic solution INSTILL 1 DROP INTO EACH EYE TWICE A DAY   TRAVATAN Z 0.004 % SOLN ophthalmic solution Place 1 drop into both eyes at bedtime.    No facility-administered encounter medications on file as of 02/10/2019.     Review of Systems  Constitutional: Positive for appetite change. Negative for chills, fatigue, fever and unexpected weight change.  HENT: Positive for hearing loss. Negative for congestion, rhinorrhea, sinus pressure, sinus pain, sneezing, sore throat and trouble swallowing.   Eyes: Negative for discharge, redness and itching.  Respiratory: Negative for cough, chest tightness, shortness of breath and wheezing.   Cardiovascular:  Negative for chest pain, palpitations and leg swelling.  Gastrointestinal: Positive for nausea and vomiting. Negative for abdominal distention, abdominal pain, constipation and diarrhea.  Endocrine: Negative for cold intolerance, heat intolerance, polydipsia, polyphagia and polyuria.  Genitourinary: Negative for dysuria, flank pain, frequency and urgency.  Musculoskeletal: Positive for arthralgias and gait problem.  Skin: Negative for color change, pallor and rash.  Neurological: Negative for dizziness, light-headedness and headaches.  Psychiatric/Behavioral: Positive for confusion. Negative for agitation and sleep disturbance. The patient is not nervous/anxious.     Immunization History  Administered Date(s) Administered   Influenza Split 09/20/2012   Influenza, High Dose Seasonal PF 09/02/2016, 10/24/2018     Influenza,inj,Quad PF,6+ Mos 10/24/2013, 10/10/2014, 08/20/2015, 01/12/2018   Pneumococcal Conjugate-13 02/19/2015   Pneumococcal Polysaccharide-23 12/01/2011   Tdap 01/31/2019   Zoster Recombinat (Shingrix) 01/31/2019   Pertinent  Health Maintenance Due  Topic Date Due   OPHTHALMOLOGY EXAM  06/21/2019   HEMOGLOBIN A1C  07/28/2019   FOOT EXAM  01/28/2020   INFLUENZA VACCINE  Completed   DEXA SCAN  Completed   PNA vac Low Risk Adult  Completed   Fall Risk  02/10/2019 01/27/2019 10/21/2018 07/19/2018 04/12/2018  Falls in the past year? 0 0 1 No No  Number falls in past yr: 0 0 0 - -  Injury with Fall? 0 0 0 - -  Comment - - - - -  Risk for fall due to : - - Impaired balance/gait - -  Follow up - - - - -    Vitals:   02/10/19 1039  BP: 122/60  Pulse: 75  Temp: (!) 97.5 F (36.4 C)  TempSrc: Oral  SpO2: 97%  Weight: 196 lb (88.9 kg)  Height: 5\' 3"  (1.6 m)   Body mass index is 34.72 kg/m. Physical Exam Constitutional:      General: She is not in acute distress.    Appearance: She is obese. She is diaphoretic.  HENT:     Head: Normocephalic.     Right Ear: Tympanic membrane, ear canal and external ear normal. There is no impacted cerumen.     Left Ear: Tympanic membrane, ear canal and external ear normal. There is no impacted cerumen.     Nose: Nose normal. No congestion or rhinorrhea.     Comments: Left hearing aid     Mouth/Throat:     Mouth: Mucous membranes are moist.     Pharynx: Oropharynx is clear. No oropharyngeal exudate or posterior oropharyngeal erythema.  Eyes:     General: No scleral icterus.       Right eye: No discharge.        Left eye: No discharge.     Conjunctiva/sclera: Conjunctivae normal.     Pupils: Pupils are equal, round, and reactive to light.  Neck:     Musculoskeletal: Normal range of motion. No neck rigidity or muscular tenderness.     Vascular: No carotid bruit.  Cardiovascular:     Rate and Rhythm: Normal rate and regular  rhythm.     Pulses: Normal pulses.     Heart sounds: Normal heart sounds. No murmur. No friction rub. No gallop.   Pulmonary:     Effort: Pulmonary effort is normal. No respiratory distress.     Breath sounds: Normal breath sounds. No wheezing, rhonchi or rales.  Chest:     Chest wall: No tenderness.  Abdominal:     General: Bowel sounds are normal. There is no distension.     Palpations:  Abdomen is soft. There is no mass.     Tenderness: There is no abdominal tenderness. There is no right CVA tenderness, left CVA tenderness, guarding or rebound.  Musculoskeletal:        General: No tenderness.     Right lower leg: No edema.     Left lower leg: No edema.     Comments: Unsteady gait on wheelchair during visit but uses power wheelchair at home transfer self to and front from chair   Lymphadenopathy:     Cervical: No cervical adenopathy.  Skin:    General: Skin is warm.     Coloration: Skin is not pale.     Findings: No erythema or rash.  Neurological:     Mental Status: She is alert.     Cranial Nerves: No cranial nerve deficit.     Sensory: No sensory deficit.     Coordination: Coordination normal.     Gait: Gait abnormal.     Comments: Alert and oriented to person and place   Psychiatric:        Mood and Affect: Mood normal.        Speech: Speech normal.        Behavior: Behavior normal.        Thought Content: Thought content normal.        Cognition and Memory: Memory is impaired.        Judgment: Judgment normal.     Comments: Scored 11/30 on MMSE    Labs reviewed: Recent Labs    11/01/18 0805 11/17/18 0834 01/27/19 0944  NA 140 138 140  K 4.1 3.7 3.3*  CL 107 108 108  CO2 18* 20 18*  GLUCOSE 130* 157* 189*  BUN 25 30* 33*  CREATININE 2.48* 2.56* 3.23*  CALCIUM 8.6 7.4* 8.2*   Recent Labs    03/24/18 0806 07/13/18 0807 10/17/18 0801 01/27/19 0944  AST 14 14  --  17  ALT 8 7 9 8   BILITOT 0.3 0.2  --  0.3  PROT 6.9 6.0*  --  6.9   Recent Labs     07/13/18 0807 01/27/19 0944  WBC 6.0 8.8  NEUTROABS 2,838 5,641  HGB 9.9* 10.6*  HCT 30.9* 33.6*  MCV 84.7 83.4  PLT 232 315   Lab Results  Component Value Date   TSH 2.213 01/01/2016   Lab Results  Component Value Date   HGBA1C 6.6 (H) 01/27/2019   Lab Results  Component Value Date   CHOL 169 10/17/2018   HDL 43 (L) 10/17/2018   LDLCALC 92 10/17/2018   TRIG 242 (H) 10/17/2018   CHOLHDL 3.9 10/17/2018    Significant Diagnostic Results in last 30 days:  No results found.  Assessment/Plan  1. Nausea and vomiting, intractability of vomiting not specified, unspecified vomiting type Afebrile.Has had symptoms x 1 week with poor oral intake.No symptoms during visit.will send to ER for further evaluation.Possible dehydration unable to void during visit.   2. Lower abdominal pain Bilateral lower abdominal pain.unable to void to provide urine specimen for Urine analysis.Send to ER for further evaluation.   3. Type 2 diabetes mellitus with diabetic nephropathy, with long-term current use of insulin (HCC) Lab Results  Component Value Date   HGBA1C 6.6 (H) 01/27/2019   Diaphoretic and confused during visit CBG 49 orange juices rechecked 69 more juice given CBG went up to 88 and patient became more alert and oriented.   - POC Glucose (CBG) - POC Glucose (CBG)  4. Low blood potassium - BASIC METABOLIC PANEL WITH GFR  5. Low blood sugar Low blood sugars as above.  - POC Glucose (CBG) - POC Glucose (CBG)  Family/ staff Communication: Reviewed plan of care with patient and son.Discussed with patient and son need for further evaluation in the ER due to inability to urinate with complains nausea,vomiting and lower abdominal pain.Send to ER via EMS at 12: 30 pm   Labs/tests ordered: BMP   Sandrea Hughs, NP

## 2019-02-10 NOTE — ED Notes (Signed)
ED TO INPATIENT HANDOFF REPORT  ED Nurse Name and Phone #: Daralene Milch, RN  [phone]  S Name/Age/Gender Michelle Buck 83 y.o. female Room/Bed: TRAAC/TRAAC  Code Status   Code Status: Full Code  Home/SNF/Other Home Patient oriented to: self, place, time and situation Is this baseline? Yes   Triage Complete: Triage complete  Chief Complaint sick  Triage Note [facility]- pt here from PCP office with complaint of n/v and abdominal pain X1 week. Pt is alert and oriented. Skin warm and dry. She states she has had decreased appetite.    Allergies Allergies  Allergen Reactions  . Fluticasone-Salmeterol     numbness  . Tramadol Other (See Comments)    Leg cramps   . Codeine Nausea And Vomiting and Other (See Comments)    Patient states N/V with codeine  . Hydrocodone Nausea And Vomiting  . Penicillins Rash and Other (See Comments)    Patient states rash/itch with penicillin Has patient had a PCN reaction causing immediate rash, facial/tongue/throat swelling, SOB or lightheadedness with hypotension: Yes- broke me out and i was itching Has patient had a PCN reaction causing severe rash involving mucus membranes or skin necrosis: No Has patient had a PCN reaction that required hospitalization Yes- i was already in the hospital Has patient had a PCN reaction occurring within the last 10 years: No If all of the above answers are "NO  . Tylenol [Acetaminophen] Hives    Level of Care/Admitting Diagnosis ED Disposition    ED Disposition Condition [facility] Area: [place] [[id number]]  Level of Care: Telemetry Cardiac [[id number]]  Diagnosis: Acute renal failure superimposed on stage 4 chronic kidney disease [facility]) [[id number]]  Admitting Physician: Lady Deutscher [[id number]]  Attending Physician: Lady Deutscher [[id number]]  Estimated length of stay: past midnight tomorrow  Certification:: I certify this patient will need inpatient services for at least 2  midnights  PT Class (Do Not Modify): Inpatient [101]  PT Acc Code (Do Not Modify): Private [1]       B Medical/Surgery History Past Medical History:  Diagnosis Date  . Acute bronchitis   . Anxiety   . Arthritis   . Chronic kidney disease, stage II (mild)   . Complications affecting other specified body systems, hypertension   . Congestive heart failure, unspecified    stress test in March at [facility]  . COPD (chronic obstructive pulmonary disease) ([place])    present for several years, diagnosed in the last few years  . Degenerative arthritis of knee, bilateral   . Diabetes mellitus   . Diaphragmatic hernia without mention of obstruction or gangrene   . Disorder of bone and cartilage, unspecified   . GERD (gastroesophageal reflux disease)   . Hiatal hernia   . Hyperlipidemia   . Hypertension   . Hypopotassemia   . Insomnia, unspecified   . Lumbago   . Obese   . Osteoarthrosis, unspecified whether generalized or localized, unspecified site   . PONV (postoperative nausea and vomiting)   . Scoliosis   . Secondary diabetes mellitus with renal manifestations, not stated as uncontrolled, or unspecified(249.40)   . Unspecified glaucoma(365.9)   . Unspecified hereditary and idiopathic peripheral neuropathy    Past Surgical History:  Procedure Laterality Date  . ABDOMINAL HYSTERECTOMY    . COLONOSCOPY  08/06/2011  . ESOPHAGOGASTRODUODENOSCOPY (EGD) WITH PROPOFOL N/A 05/26/2018   Procedure: ESOPHAGOGASTRODUODENOSCOPY (EGD) WITH PROPOFOL;  Surgeon: Milus Banister, MD;  Location: [facility];  Service: Endoscopy;  Laterality: N/A;  . FOOT SURGERY    . KNEE ARTHROSCOPY     bilateral  . SHOULDER SURGERY    . TOTAL HIP ARTHROPLASTY     bilateral     A IV Location/Drains/Wounds Patient Lines/Drains/Airways Status   Active Line/Drains/Airways    Name:   Placement date:   Placement time:   Site:   Days:   Peripheral IV 02/10/19 Left Forearm   02/10/19    1501    Forearm    less than 1          Intake/Output Last 24 hours  Intake/Output Summary (Last 24 hours) at 02/10/2019 2008 Last data filed at 02/10/2019 1840 Gross per 24 hour  Intake 1000 ml  Output -  Net 1000 ml    Labs/Imaging Results for orders placed or performed during the hospital encounter of 02/10/19 (from the past 48 hour(s))  CBG monitoring, ED     Status: None   Collection Time: 02/10/19  1:20 PM  Result Value Ref Range   Glucose-Capillary 83 70 - 99 mg/dL  Lipase, blood     Status: None   Collection Time: 02/10/19  1:27 PM  Result Value Ref Range   Lipase 24 11 - 51 U/L    Comment: Performed at [facility], [place] [address], [place], [place] [address]  Comprehensive metabolic panel     Status: Abnormal   Collection Time: 02/10/19  1:27 PM  Result Value Ref Range   Sodium 135 135 - 145 mmol/L   Potassium 3.6 3.5 - 5.1 mmol/L   Chloride 112 (H) 98 - 111 mmol/L   CO2 14 (L) 22 - 32 mmol/L   Glucose, Bld 88 70 - 99 mg/dL   BUN 49 (H) 8 - 23 mg/dL   Creatinine, Ser 5.66 (H) 0.44 - 1.00 mg/dL   Calcium 7.4 (L) 8.9 - 10.3 mg/dL   Total Protein 7.0 6.5 - 8.1 g/dL   Albumin 3.1 (L) 3.5 - 5.0 g/dL   AST 20 15 - 41 U/L   ALT 10 0 - 44 U/L   Alkaline Phosphatase 133 (H) 38 - 126 U/L   Total Bilirubin 0.5 0.3 - 1.2 mg/dL   GFR calc non Af Amer 7 (L) >60 mL/min   GFR calc Af Amer 8 (L) >60 mL/min   Anion gap 9 5 - 15    Comment: Performed at [facility], [place] [address], [place], [place] [address]  CBC     Status: Abnormal   Collection Time: 02/10/19  1:27 PM  Result Value Ref Range   WBC 12.2 (H) 4.0 - 10.5 K/uL   RBC 3.92 3.87 - 5.11 MIL/uL   Hemoglobin 10.6 (L) 12.0 - 15.0 g/dL   HCT 33.8 (L) 36.0 - 46.0 %   MCV 86.2 80.0 - 100.0 fL   MCH 27.0 26.0 - 34.0 pg   MCHC 31.4 30.0 - 36.0 g/dL   RDW 16.4 (H) 11.5 - 15.5 %   Platelets 364 150 - 400 K/uL   nRBC 0.4 (H) 0.0 - 0.2 %    Comment: Performed at [place] [address] [place], [place] [address]   CBG monitoring, ED     Status: Abnormal   Collection Time: 02/10/19  2:50 PM  Result Value Ref Range   Glucose-Capillary 62 (L) 70 - 99 mg/dL   Comment 1 Notify RN    Comment 2 Document in Chart   Lactic  acid, plasma     Status: None   Collection Time: 02/10/19  4:23 PM  Result Value Ref Range   Lactic Acid, Venous 0.7 0.5 - 1.9 mmol/L    Comment: Performed at [facility], [place] [address] [place], [place] [address]  CBG monitoring, ED     Status: Abnormal   Collection Time: 02/10/19  4:29 PM  Result Value Ref Range   Glucose-Capillary 65 (L) 70 - 99 mg/dL   Comment 1 Notify RN    Comment 2 Document in Chart   I-stat troponin, ED     Status: None   Collection Time: 02/10/19  4:30 PM  Result Value Ref Range   Troponin i, poc 0.02 0.00 - 0.08 ng/mL   Comment 3            Comment: Due to the release kinetics of cTnI, a negative result within the first hours of the onset of symptoms does not rule out myocardial infarction with certainty. If myocardial infarction is still suspected, repeat the test at appropriate intervals.   POCT I-Stat EG7     Status: Abnormal   Collection Time: 02/10/19  4:32 PM  Result Value Ref Range   pH, Ven 7.208 (L) 7.250 - 7.430   pCO2, Ven 33.5 (L) 44.0 - 60.0 mmHg   pO2, Ven 147.0 (H) 32.0 - 45.0 mmHg   Bicarbonate 13.3 (L) 20.0 - 28.0 mmol/L   TCO2 14 (L) 22 - 32 mmol/L   O2 Saturation 99.0 %   Acid-base deficit 14.0 (H) 0.0 - 2.0 mmol/L   Sodium 137 135 - 145 mmol/L   Potassium 3.8 3.5 - 5.1 mmol/L   Calcium, Ion 0.94 (L) 1.15 - 1.40 mmol/L   HCT 32.0 (L) 36.0 - 46.0 %   Hemoglobin 10.9 (L) 12.0 - 15.0 g/dL   Patient temperature HIDE    Sample type VENOUS   CBG monitoring, ED     Status: None   Collection Time: 02/10/19  6:17 PM  Result Value Ref Range   Glucose-Capillary 71 70 - 99 mg/dL   Ct Abdomen Pelvis Wo Contrast  Result Date: 02/10/2019 CLINICAL DATA:  Nausea and vomiting for 1 week. Lower abdominal pain and poor appetite.  EXAM: CT ABDOMEN AND PELVIS WITHOUT CONTRAST TECHNIQUE: Multidetector CT imaging of the abdomen and pelvis was performed following the standard protocol without IV contrast. COMPARISON:  CT scan 05/03/2017 FINDINGS: Lower chest: Streaky bibasilar atelectasis but no infiltrates or effusions. Coronary artery calcifications are noted. There is a large hiatal hernia. Hepatobiliary: No definite hepatic lesions without contrast. The gallbladder is grossly normal. No common bile duct dilatation. Pancreas: Significant fatty atrophy but no mass or ductal dilatation. Spleen: Normal size.  No focal liver lesions. Adrenals/Urinary Tract: Stable left adrenal gland lesions consistent with benign adenomas. The right adrenal gland is unremarkable. Persistent right renal calculi. The largest calculus measures 18 mm. No hydronephrosis or obstructing ureteral calculi. No left-sided renal or ureteral calculi. No bladder calculi or obvious bladder mass. Stomach/Bowel: The stomach, duodenum, small bowel and colon are grossly normal without oral contrast. Significant sigmoid colon diverticulosis but no definite findings for acute diverticulitis. There are some scattered air-fluid levels in the transverse colon which can be seen with diarrhea. The terminal ileum appears normal. Vascular/Lymphatic: Advanced atherosclerotic calcifications involving the aorta and iliac arteries. No aneurysm. No mesenteric or retroperitoneal mass or adenopathy. Small scattered lymph nodes are noted. Reproductive: Surgically absent. Other: Examination of the lower pelvis is somewhat limited  by bilateral hip prosthesis. Musculoskeletal: Severe scoliosis and degenerative lumbar spondylosis but no acute bony findings. IMPRESSION: 1. Right renal calculi but no obstructing ureteral calculi and no definite renal or bladder mass. 2. Large hiatal hernia. 3. No acute abdominal/pelvic findings, mass lesions or lymphadenopathy. 4. Sigmoid colon diverticulosis without  definite findings for acute diverticulitis. Electronically Signed   By: Marijo Sanes M.D.   On: 02/10/2019 18:04    Pending Labs Unresulted Labs (From admission, onward)    Start     Ordered   02/11/19 5462  Basic metabolic panel  Tomorrow morning,   R     02/10/19 1905   02/11/19 0500  CBC  Tomorrow morning,   R     02/10/19 1905   02/10/19 1905  Gastrointestinal Panel by PCR , Stool  (Gastrointestinal Panel by PCR, Stool)  Once,   R     02/10/19 1905   02/10/19 1903  C difficile quick scan w PCR reflex  (C Difficile quick screen w PCR reflex panel)  Once, for 24 hours,   R     02/10/19 1905   02/10/19 1901  CBC  (heparin)  Once,   R    Comments:  Baseline for heparin therapy IF NOT ALREADY DRAWN.  Notify MD if PLT < 100 K.    02/10/19 1905   02/10/19 1901  Creatinine, serum  (heparin)  Once,   R    Comments:  Baseline for heparin therapy IF NOT ALREADY DRAWN.    02/10/19 1905   02/10/19 1601  Lactic acid, plasma  Now then every 2 hours,   STAT     02/10/19 1600   02/10/19 1512  Urine culture  ONCE - STAT,   STAT     02/10/19 1514   02/10/19 1310  Urinalysis, Routine w reflex microscopic  ONCE - STAT,   STAT     02/10/19 1310          Vitals/Pain Today's Vitals   02/10/19 1730 02/10/19 1800 02/10/19 1830 02/10/19 1900  BP: 117/62     Pulse: 79 86 77 77  Resp: (!) 22 17 15 17   Temp:      TempSrc:      SpO2: 96% 96% 98% 98%  PainSc:        Isolation Precautions Enteric precautions (UV disinfection)  Medications Medications  sodium chloride flush (NS) 0.9 % injection 3 mL (has no administration in time range)  heparin injection 5,000 Units (has no administration in time range)  sodium chloride flush (NS) 0.9 % injection 3 mL (has no administration in time range)  0.9 %  sodium chloride infusion (has no administration in time range)  ondansetron (ZOFRAN) tablet 4 mg (has no administration in time range)    Or  ondansetron (ZOFRAN) injection 4 mg (has no  administration in time range)  fentaNYL (SUBLIMAZE) injection 25 mcg (25 mcg Intravenous Given 02/10/19 1546)  ondansetron (ZOFRAN) injection 4 mg (4 mg Intravenous Given 02/10/19 1546)  sodium chloride 0.9 % bolus 1,000 mL (0 mLs Intravenous Stopped 02/10/19 1840)    Mobility power wheelchair Low fall risk   Focused Assessments Gastrointestinal Assessment   R Recommendations: See Admitting Provider Note  Report given to:   Additional Notes:  Pt lives at home alone but has help from family members.   Pt is on 02 at home at 4 LPM

## 2019-02-10 NOTE — ED Notes (Signed)
Patient placed on bedpan for urine sample, but she reported it was hurting her back too much. Pure wick in place.

## 2019-02-10 NOTE — ED Notes (Signed)
Bladder scan showed 291 ml.

## 2019-02-10 NOTE — ED Notes (Signed)
MD at bedside. 

## 2019-02-10 NOTE — Progress Notes (Signed)
Pt has orders for CPAP HS.  Spoke to pt and pt's daughter at bedside.  Pt has unit at home but does not use it.  Both state that she can't tolerated CPAP.  Pt refused use of hosp's CPAP unit at this time.  Remains on 2L [place].

## 2019-02-10 NOTE — Patient Instructions (Signed)
Michelle Buck , Thank you for taking time to come for your Medicare Wellness Visit. I appreciate your ongoing commitment to your health goals. Please review the following plan we discussed and let me know if I can assist you in the future.   Screening recommendations/referrals: Colonoscopy: Up to date  Mammogram: N/A  Bone Density: Up to date  Recommended yearly ophthalmology/optometry visit for glaucoma screening and checkup Recommended yearly dental visit for hygiene and checkup  Vaccinations: Influenza vaccine: Up to date  Pneumococcal vaccine: Up to date  Tdap vaccine: up to date  Shingles vaccine: Up to date     Advanced directives: Copy in chart   Conditions/risks identified:Advance age > 23 yrs,Type 2DM,Hypertension,Hyperlipidemia,Obesity,Hx smoking ,sedentary life style  Next appointment: 1 year   Preventive Care 53 Years and Older, Female Preventive care refers to lifestyle choices and visits with your health care provider that can promote health and wellness. What does preventive care include?  A yearly physical exam. This is also called an annual well check.  Dental exams once or twice a year.  Routine eye exams. Ask your health care provider how often you should have your eyes checked.  Personal lifestyle choices, including:  Daily care of your teeth and gums.  Regular physical activity.  Eating a healthy diet.  Avoiding tobacco and drug use.  Limiting alcohol use.  Practicing safe sex.  Taking low-dose aspirin every day.  Taking vitamin and mineral supplements as recommended by your health care provider. What happens during an annual well check? The services and screenings done by your health care provider during your annual well check will depend on your age, overall health, lifestyle risk factors, and family history of disease. Counseling  Your health care provider may ask you questions about your:  Alcohol use.  Tobacco use.  Drug use.   Emotional well-being.  Home and relationship well-being.  Sexual activity.  Eating habits.  History of falls.  Memory and ability to understand (cognition).  Work and work Statistician.  Reproductive health. Screening  You may have the following tests or measurements:  Height, weight, and BMI.  Blood pressure.  Lipid and cholesterol levels. These may be checked every 5 years, or more frequently if you are over 72 years old.  Skin check.  Lung cancer screening. You may have this screening every year starting at age 64 if you have a 30-pack-year history of smoking and currently smoke or have quit within the past 15 years.  Fecal occult blood test (FOBT) of the stool. You may have this test every year starting at age 69.  Flexible sigmoidoscopy or colonoscopy. You may have a sigmoidoscopy every 5 years or a colonoscopy every 10 years starting at age 13.  Hepatitis C blood test.  Hepatitis B blood test.  Sexually transmitted disease (STD) testing.  Diabetes screening. This is done by checking your blood sugar (glucose) after you have not eaten for a while (fasting). You may have this done every 1-3 years.  Bone density scan. This is done to screen for osteoporosis. You may have this done starting at age 63.  Mammogram. This may be done every 1-2 years. Talk to your health care provider about how often you should have regular mammograms. Talk with your health care provider about your test results, treatment options, and if necessary, the need for more tests. Vaccines  Your health care provider may recommend certain vaccines, such as:  Influenza vaccine. This is recommended every year.  Tetanus, diphtheria, and  acellular pertussis (Tdap, Td) vaccine. You may need a Td booster every 10 years.  Zoster vaccine. You may need this after age 49.  Pneumococcal 13-valent conjugate (PCV13) vaccine. One dose is recommended after age 32.  Pneumococcal polysaccharide (PPSV23)  vaccine. One dose is recommended after age 6. Talk to your health care provider about which screenings and vaccines you need and how often you need them. This information is not intended to replace advice given to you by your health care provider. Make sure you discuss any questions you have with your health care provider. Document Released: 12/13/2015 Document Revised: 08/05/2016 Document Reviewed: 09/17/2015 Elsevier Interactive Patient Education  2017 [place] Prevention in the Home Falls can cause injuries. They can happen to people of all ages. There are many things you can do to make your home safe and to help prevent falls. What can I do on the outside of my home?  Regularly fix the edges of walkways and driveways and fix any cracks.  Remove anything that might make you trip as you walk through a door, such as a raised step or threshold.  Trim any bushes or trees on the path to your home.  Use bright outdoor lighting.  Clear any walking paths of anything that might make someone trip, such as rocks or tools.  Regularly check to see if handrails are loose or broken. Make sure that both sides of any steps have handrails.  Any raised decks and porches should have guardrails on the edges.  Have any leaves, snow, or ice cleared regularly.  Use sand or salt on walking paths during winter.  Clean up any spills in your garage right away. This includes oil or grease spills. What can I do in the bathroom?  Use night lights.  Install grab bars by the toilet and in the tub and shower. Do not use towel bars as grab bars.  Use non-skid mats or decals in the tub or shower.  If you need to sit down in the shower, use a plastic, non-slip stool.  Keep the floor dry. Clean up any water that spills on the floor as soon as it happens.  Remove soap buildup in the tub or shower regularly.  Attach bath mats securely with double-sided non-slip rug tape.  Do not have throw rugs  and other things on the floor that can make you trip. What can I do in the bedroom?  Use night lights.  Make sure that you have a light by your bed that is easy to reach.  Do not use any sheets or blankets that are too big for your bed. They should not hang down onto the floor.  Have a firm chair that has side arms. You can use this for support while you get dressed.  Do not have throw rugs and other things on the floor that can make you trip. What can I do in the kitchen?  Clean up any spills right away.  Avoid walking on wet floors.  Keep items that you use a lot in easy-to-reach places.  If you need to reach something above you, use a strong step stool that has a grab bar.  Keep electrical cords out of the way.  Do not use floor polish or wax that makes floors slippery. If you must use wax, use non-skid floor wax.  Do not have throw rugs and other things on the floor that can make you trip. What can I do with my stairs?  Do not leave any items on the stairs.  Make sure that there are handrails on both sides of the stairs and use them. Fix handrails that are broken or loose. Make sure that handrails are as long as the stairways.  Check any carpeting to make sure that it is firmly attached to the stairs. Fix any carpet that is loose or worn.  Avoid having throw rugs at the top or bottom of the stairs. If you do have throw rugs, attach them to the floor with carpet tape.  Make sure that you have a light switch at the top of the stairs and the bottom of the stairs. If you do not have them, ask someone to add them for you. What else can I do to help prevent falls?  Wear shoes that:  Do not have high heels.  Have rubber bottoms.  Are comfortable and fit you well.  Are closed at the toe. Do not wear sandals.  If you use a stepladder:  Make sure that it is fully opened. Do not climb a closed stepladder.  Make sure that both sides of the stepladder are locked into  place.  Ask someone to hold it for you, if possible.  Clearly mark and make sure that you can see:  Any grab bars or handrails.  First and last steps.  Where the edge of each step is.  Use tools that help you move around (mobility aids) if they are needed. These include:  Canes.  Walkers.  Scooters.  Crutches.  Turn on the lights when you go into a dark area. Replace any light bulbs as soon as they burn out.  Set up your furniture so you have a clear path. Avoid moving your furniture around.  If any of your floors are uneven, fix them.  If there are any pets around you, be aware of where they are.  Review your medicines with your doctor. Some medicines can make you feel dizzy. This can increase your chance of falling. Ask your doctor what other things that you can do to help prevent falls. This information is not intended to replace advice given to you by your health care provider. Make sure you discuss any questions you have with your health care provider. Document Released: 09/12/2009 Document Revised: 04/23/2016 Document Reviewed: 12/21/2014 Elsevier Interactive Patient Education  2017 [employer].

## 2019-02-10 NOTE — ED Notes (Signed)
Patient states that right before her pain started in RLQ - she gave herself an insulin injection there and noticed that the needle did not come back out when done. Patient reports telling her PCP, but nothing was done. No obvious redness, swelling to site. Patient denies tenderness, but reports the pain began there and has moved "further in." MD aware.

## 2019-02-10 NOTE — Progress Notes (Signed)
Subjective:   Katrinka Herbison is a 83 y.o. female who presents for Medicare Annual (Subsequent) preventive examination.  Review of Systems:  Cardiac Risk Factors include: advanced age (>79men, >72 women);diabetes mellitus;dyslipidemia;obesity (BMI >30kg/m2);sedentary lifestyle;hypertension;smoking/ tobacco exposure     Objective:     Vitals: BP 122/60   Pulse 75   Temp (!) 97.5 F (36.4 C) (Oral)   Ht 5\' 3"  (1.6 m)   Wt 196 lb (88.9 kg)   SpO2 97%   BMI 34.72 kg/m   Body mass index is 34.72 kg/m.  Advanced Directives 02/10/2019 01/27/2019 10/21/2018 07/19/2018 05/26/2018 04/12/2018 02/08/2018  Does Patient Have a Medical Advance Directive? Yes Yes Yes Yes Yes No No  Type of Advance Directive Out of facility DNR (pink MOST or yellow form) Out of facility DNR (pink MOST or yellow form) Out of facility DNR (pink MOST or yellow form) Out of facility DNR (pink MOST or yellow form) [place] - -  Does patient want to make changes to medical advance directive? No - Patient declined - - - - - -  Copy of Press photographer in Chart? - - - - - - -  Would patient like information on creating a medical advance directive? - - - - - - Yes (MAU/Ambulatory/Procedural Areas - Information given)  Pre-existing out of facility DNR order (yellow form or pink MOST form) Yellow form placed in chart (order not valid for inpatient use) Yellow form placed in chart (order not valid for inpatient use) Yellow form placed in chart (order not valid for inpatient use) Yellow form placed in chart (order not valid for inpatient use) - - -    Tobacco Social History   Tobacco Use  Smoking Status Former Smoker  . Packs/day: 0.50  . Years: 3.00  . Pack years: 1.50  . Types: Cigarettes  . Last attempt to quit: 02/27/1997  . Years since quitting: 21.9  Smokeless Tobacco Never Used  Tobacco Comment   remote history - stopped 19 years ago; 20 pack year history     Counseling given: Not  Answered Comment: remote history - stopped 19 years ago; 20 pack year history   Clinical Intake:  Pre-visit preparation completed: No  Pain : 0-10 Pain Score: 10-Worst pain ever Pain Type: Chronic pain Pain Location: Other (Comment)(OA ) Pain Orientation: Other (Comment)(joints/back ) Pain Radiating Towards: No  Pain Descriptors / Indicators: Aching Pain Onset: Other (comment)(2-3 years ) Pain Frequency: Constant Effect of Pain on Daily Activities: walking      BMI - recorded: 34.72 Nutritional Status: BMI > 30  Obese Nutritional Risks: None Diabetes: Yes CBG done?: No Did pt. bring in CBG monitor from home?: No(log CBG 130-180 x 2 in the 200's )  How often do you need to have someone help you when you read instructions, pamphlets, or other written materials from your doctor or pharmacy?: 4 - Often What is the last grade level you completed in school?: 5 th grade   Interpreter Needed?: No  Information entered by :: Matix Henshaw FNP-C   Past Medical History:  Diagnosis Date  . Acute bronchitis   . Anxiety   . Arthritis   . Chronic kidney disease, stage II (mild)   . Complications affecting other specified body systems, hypertension   . Congestive heart failure, unspecified    stress test in March at [facility]  . COPD (chronic obstructive pulmonary disease) ([place])    present for several years, diagnosed in  the last few years  . Degenerative arthritis of knee, bilateral   . Diabetes mellitus   . Diaphragmatic hernia without mention of obstruction or gangrene   . Disorder of bone and cartilage, unspecified   . GERD (gastroesophageal reflux disease)   . Hiatal hernia   . Hyperlipidemia   . Hypertension   . Hypopotassemia   . Insomnia, unspecified   . Lumbago   . Obese   . Osteoarthrosis, unspecified whether generalized or localized, unspecified site   . PONV (postoperative nausea and vomiting)   . Scoliosis   . Secondary diabetes mellitus with renal  manifestations, not stated as uncontrolled, or unspecified(249.40)   . Unspecified glaucoma(365.9)   . Unspecified hereditary and idiopathic peripheral neuropathy    Past Surgical History:  Procedure Laterality Date  . ABDOMINAL HYSTERECTOMY    . COLONOSCOPY  08/06/2011  . ESOPHAGOGASTRODUODENOSCOPY (EGD) WITH PROPOFOL N/A 05/26/2018   Procedure: ESOPHAGOGASTRODUODENOSCOPY (EGD) WITH PROPOFOL;  Surgeon: Milus Banister, MD;  Location: [facility];  Service: Endoscopy;  Laterality: N/A;  . FOOT SURGERY    . KNEE ARTHROSCOPY     bilateral  . SHOULDER SURGERY    . TOTAL HIP ARTHROPLASTY     bilateral   Family History  Problem Relation Age of Onset  . Diabetes Mother   . Heart disease Mother   . Heart disease Brother   . Diabetes Brother   . Kidney disease Father   . Scoliosis Sister   . Diabetes Brother   . Diabetes Brother   . Heart disease Brother   . Scoliosis Brother   . Stroke Brother   . Heart attack Brother    Social History   Socioeconomic History  . Marital status: Widowed    Spouse name: Not on file  . Number of children: Not on file  . Years of education: Not on file  . Highest education level: Not on file  Occupational History  . Occupation: Retired    Fish farm manager: UNEMPLOYED  Social Needs  . Financial resource strain: Not very hard  . Food insecurity:    Worry: Never true    Inability: Never true  . Transportation needs:    Medical: No    Non-medical: No  Tobacco Use  . Smoking status: Former Smoker    Packs/day: 0.50    Years: 3.00    Pack years: 1.50    Types: Cigarettes    Last attempt to quit: 02/27/1997    Years since quitting: 21.9  . Smokeless tobacco: Never Used  . Tobacco comment: remote history - stopped 19 years ago; 20 pack year history  Substance and Sexual Activity  . Alcohol use: No    Alcohol/week: 0.0 standard drinks  . Drug use: No  . Sexual activity: Never  Lifestyle  . Physical activity:    Days per week: 0 days    Minutes  per session: 0 min  . Stress: Not at all  Relationships  . Social connections:    Talks on phone: More than three times a week    Gets together: More than three times a week    Attends religious service: Never    Active member of club or organization: No    Attends meetings of clubs or organizations: Never    Relationship status: Widowed  Other Topics Concern  . Not on file  Social History Narrative   Widowed   No children   Never held a steady job    Outpatient Encounter Medications as  of 02/10/2019  Medication Sig  . aspirin EC 81 MG tablet Take 1 tablet (81 mg total) by mouth daily.  . budesonide (PULMICORT) 0.25 MG/2ML nebulizer solution USE 1 AMPULE IN NEBULIZER TWICE DAILY ROUTINELY FOR COPD  . cilostazol (PLETAL) 100 MG tablet TAKE 1 TABLET BY MOUTH ONCE DAILY  . cycloSPORINE (RESTASIS) 0.05 % ophthalmic emulsion Place 1 drop into both eyes 2 (two) times daily.  . DULoxetine (CYMBALTA) 30 MG capsule TAKE 1 CAPSULE BY MOUTH EVERY DAY TO HELP ANXIETY AND PAINS.  Marland Kitchen esomeprazole (NEXIUM) 40 MG capsule TAKE 1 CAPSULE BY MOUTH ONCE DAILY  . hydrALAZINE (APRESOLINE) 50 MG tablet TAKE 1 TABLET BY MOUTH THREE TIMES A DAY  . HYDROcodone-acetaminophen (NORCO) 10-325 MG tablet Take 1 tablet by mouth every 6 (six) hours as needed.  . insulin aspart protamine- aspart (NOVOLOG MIX 70/30) (70-30) 100 UNIT/ML injection INJECT 40 UNITS SUBCUTANEOUSLY WITH BREAKFAST AND INJECT 25 UNITS SUBCUTANEOUSLY WITH DINNER TO CONTROL BLOOD SUGAR.  Marland Kitchen lubiprostone (AMITIZA) 24 MCG capsule Take 1 capsule (24 mcg total) by mouth 2 (two) times daily.  . metoprolol succinate (TOPROL-XL) 25 MG 24 hr tablet TAKE 1 TABLET BY MOUTH ONCE DAILY WITH OR IMMEDIATELY FOLLOWING A MEAL FOR BLOOD PRESSURE  . potassium chloride SA (K-DUR,KLOR-CON) 20 MEQ tablet Take 2 tablets (40 mEq total) by mouth daily.  . simvastatin (ZOCOR) 40 MG tablet Take 1 tablet (40 mg total) by mouth daily.  . sucralfate (CARAFATE) 1 g tablet TAKE  1 TABLET BY MOUTH TWICE DAILY ON EMPTY STOMACH  . timolol (TIMOPTIC) 0.5 % ophthalmic solution INSTILL 1 DROP INTO EACH EYE TWICE A DAY  . TRAVATAN Z 0.004 % SOLN ophthalmic solution Place 1 drop into both eyes at bedtime.    No facility-administered encounter medications on file as of 02/10/2019.     Activities of Daily Living In your present state of health, do you have any difficulty performing the following activities: 02/10/2019  Hearing? Y  Vision? N  Difficulty concentrating or making decisions? N  Walking or climbing stairs? Y  Comment uses power wheelchair   Dressing or bathing? Y  Doing errands, shopping? Y  Comment needs Teacher, adult education and eating ? Y  Using the Toilet? N  In the past six months, have you accidently leaked urine? N  Do you have problems with loss of bowel control? N  Managing your Medications? Y  Managing your Finances? Y  Housekeeping or managing your Housekeeping? Y  Some recent data might be hidden    Patient Care Team: [name], NP as PCP - General (Nurse Practitioner) [name], MD as Consulting Physician (Ophthalmology) [name], MD as Consulting Physician (Nephrology)    Assessment:   This is a routine wellness examination for Kaleeyah.  Exercise Activities and Dietary recommendations Current Exercise Habits: The patient does not participate in regular exercise at present, Exercise limited by: Other - see comments(pain and mobility )  Goals    . Weight (lb) < 200 lb (90.7 kg)     Starting 10/27/16, I will attempt to lose 15 lbs, to get under 200lbs.        Fall Risk Fall Risk  02/10/2019 01/27/2019 10/21/2018 07/19/2018 04/12/2018  Falls in the past year? 0 0 1 No No  Number falls in past yr: 0 0 0 - -  Injury with Fall? 0 0 0 - -  Comment - - - - -  Risk for fall due to : - -  Impaired balance/gait - -  Follow up - - - - -   Is the patient's home free of loose throw rugs in walkways, pet beds,  electrical cords, etc?   no      Grab bars in the bathroom? yes      Handrails on the stairs?  N/A       Adequate lighting?   yes  Depression Screen PHQ 2/9 Scores 02/10/2019 02/08/2018 01/12/2018 11/05/2016  PHQ - 2 Score 0 0 0 0     Cognitive Function MMSE - Mini Mental State Exam 02/10/2019 10/11/2017 10/27/2016  Orientation to time 3 5 5   Orientation to Place 4 3 3   Registration 3 3 3   Attention/ Calculation 0 0 3  Attention/Calculation-comments patient can not read or write pt unable to read or spell world/calculate due to low education -  Recall 0 2 3  Language- name 2 objects 0 2 2  Language- repeat 1 1 1   Language- follow 3 step command 2 1 3   Language- read & follow direction 1 0 0  Language-read & follow direction-comments - unable to read -  Write a sentence 0 0 0  Write a sentence-comments patient can not read or write unable to read or spell due to low education -  Copy design 0 0 0  Total score 14 17 23     Immunization History  Administered Date(s) Administered  . Influenza Split 09/20/2012  . Influenza, High Dose Seasonal PF 09/02/2016, 10/24/2018  . Influenza,inj,Quad PF,6+ Mos 10/24/2013, 10/10/2014, 08/20/2015, 01/12/2018  . Pneumococcal Conjugate-13 02/19/2015  . Pneumococcal Polysaccharide-23 12/01/2011  . Tdap 01/31/2019  . Zoster Recombinat (Shingrix) 01/31/2019    Qualifies for Shingles Vaccine? Up to date   Screening Tests Health Maintenance  Topic Date Due  . OPHTHALMOLOGY EXAM  06/21/2019  . HEMOGLOBIN A1C  07/28/2019  . FOOT EXAM  01/28/2020  . TETANUS/TDAP  01/30/2029  . INFLUENZA VACCINE  Completed  . DEXA SCAN  Completed  . PNA vac Low Risk Adult  Completed    Cancer Screenings: Lung: Low Dose CT Chest recommended if Age 57-80 years, 30 pack-year currently smoking OR have quit w/in 15years. Patient does not qualify. Breast:  Up to date on Mammogram? N/A  Up to date of Bone Density/Dexa? N/A  Colorectal: N/A   Additional Screenings:  Hepatitis C Screening: Low risk      Plan:  - low carbohydrate,low saturated fats and high vegetable diet   I have personally reviewed and noted the following in the patient's chart:   . Medical and social history . Use of alcohol, tobacco or illicit drugs  . Current medications and supplements . Functional ability and status . Nutritional status . Physical activity . Advanced directives . List of other physicians . Hospitalizations, surgeries, and ER visits in previous 12 months . Vitals . Screenings to include cognitive, depression, and falls . Referrals and appointments  In addition, I have reviewed and discussed with patient certain preventive protocols, quality metrics, and best practice recommendations. A written personalized care plan for preventive services as well as general preventive health recommendations were provided to patient.   Sandrea Hughs, NP  02/10/2019

## 2019-02-10 NOTE — H&P (Signed)
History and Physical    Andrena Margerum EHM:[id number] DOB: Mar 19, 1936 DOA: 02/10/2019  PCP: [name], NP  Patient coming from: home  I have personally briefly reviewed patient's old medical records in [place]  Chief Complaint: Normal pain with nausea vomiting and diarrhea 2 weeks  HPI: Michelle Buck is a 82 y.o. female with medical history significant of diabetes, COPD, systolic and diastolic congestive heart failure, diaphragmatic hernia, hypertension, hyperlipidemia, chronic kidney disease stage IV, obstructive sleep apnea who presents with concern for abdominal pain, nausea and vomiting with associated diarrhea.  Patient reports symptoms began as a poor appetite with nausea and inability to eat or drink and it had associated lower abdominal pain.  She is had constant dull abdominal pain rated at a 7 out of 10.  Pain worsens with eating.  And vomiting gets worse with eating.  She does not have a desire to eat much.  When she eats she throws it up.  She denies any urinary symptoms or fever.  She has had a chronic cough and dyspnea when she lays flat which is unchanged from prior.  She has had intermittent episodes of chest pain which is located in the right side but not currently present at this moment.  Pain improves with pain medications.  She reports some diarrhea even though she does take laxatives chronically for chronic constipation.  ED Course: Labs show acute on chronic kidney injury, CT scan is unremarkable.  She has metabolic acidosis.  Referred to me for further evaluation and management.  Review of Systems: As per HPI otherwise all other systems reviewed and  negative.   Past Medical History:  Diagnosis Date   Acute bronchitis    Anxiety    Arthritis    Chronic kidney disease, stage II (mild)    Complications affecting other specified body systems, hypertension    Congestive heart failure, unspecified    stress test in March at [facility]   COPD  (chronic obstructive pulmonary disease) ([place])    present for several years, diagnosed in the last few years   Degenerative arthritis of knee, bilateral    Diabetes mellitus    Diaphragmatic hernia without mention of obstruction or gangrene    Disorder of bone and cartilage, unspecified    GERD (gastroesophageal reflux disease)    Hiatal hernia    Hyperlipidemia    Hypertension    Hypopotassemia    Insomnia, unspecified    Lumbago    Obese    Osteoarthrosis, unspecified whether generalized or localized, unspecified site    PONV (postoperative nausea and vomiting)    Scoliosis    Secondary diabetes mellitus with renal manifestations, not stated as uncontrolled, or unspecified(249.40)    Unspecified glaucoma(365.9)    Unspecified hereditary and idiopathic peripheral neuropathy     Past Surgical History:  Procedure Laterality Date   ABDOMINAL HYSTERECTOMY     COLONOSCOPY  08/06/2011   ESOPHAGOGASTRODUODENOSCOPY (EGD) WITH PROPOFOL N/A 05/26/2018   Procedure: ESOPHAGOGASTRODUODENOSCOPY (EGD) WITH PROPOFOL;  Surgeon: Milus Banister, MD;  Location: [facility];  Service: Endoscopy;  Laterality: N/A;   FOOT SURGERY     KNEE ARTHROSCOPY     bilateral   SHOULDER SURGERY     TOTAL HIP ARTHROPLASTY     bilateral    Social History   Social History Narrative   Widowed   No children   Never held a steady job     reports that she quit smoking about 21 years ago. Her  smoking use included cigarettes. She has a 1.50 pack-year smoking history. She has never used smokeless tobacco. She reports that she does not drink alcohol or use drugs.  Allergies  Allergen Reactions   Fluticasone-Salmeterol     numbness   Tramadol Other (See Comments)    Leg cramps    Codeine Nausea And Vomiting and Other (See Comments)    Patient states N/V with codeine   Hydrocodone Nausea And Vomiting   Penicillins Rash and Other (See Comments)    Patient states rash/itch with  penicillin Has patient had a PCN reaction causing immediate rash, facial/tongue/throat swelling, SOB or lightheadedness with hypotension: Yes- broke me out and i was itching Has patient had a PCN reaction causing severe rash involving mucus membranes or skin necrosis: No Has patient had a PCN reaction that required hospitalization Yes- i was already in the hospital Has patient had a PCN reaction occurring within the last 10 years: No If all of the above answers are "NO   Tylenol [Acetaminophen] Hives    Family History  Problem Relation Age of Onset   Diabetes Mother    Heart disease Mother    Heart disease Brother    Diabetes Brother    Kidney disease Father    Scoliosis Sister    Diabetes Brother    Diabetes Brother    Heart disease Brother    Scoliosis Brother    Stroke Brother    Heart attack Brother     Prior to Admission medications   Medication Sig Start Date End Date Taking? Authorizing Provider  aspirin EC 81 MG tablet Take 1 tablet (81 mg total) by mouth daily. 01/27/19   [name], NP  budesonide (PULMICORT) 0.25 MG/2ML nebulizer solution USE 1 AMPULE IN NEBULIZER TWICE DAILY ROUTINELY FOR COPD 05/13/18   Tanda Rockers, MD  cilostazol (PLETAL) 100 MG tablet TAKE 1 TABLET BY MOUTH ONCE DAILY 02/07/19   [name], NP  cycloSPORINE (RESTASIS) 0.05 % ophthalmic emulsion Place 1 drop into both eyes 2 (two) times daily.    [provider]  DULoxetine (CYMBALTA) 30 MG capsule TAKE 1 CAPSULE BY MOUTH EVERY DAY TO HELP ANXIETY AND PAINS. 02/07/19   [name], NP  esomeprazole (NEXIUM) 40 MG capsule TAKE 1 CAPSULE BY MOUTH ONCE DAILY 02/07/19   [name], NP  hydrALAZINE (APRESOLINE) 50 MG tablet TAKE 1 TABLET BY MOUTH THREE TIMES A DAY 02/07/19   [name], NP  HYDROcodone-acetaminophen (NORCO) 10-325 MG tablet Take 1 tablet by mouth every 6 (six) hours as needed. 01/27/19   [name], NP  insulin aspart  protamine- aspart (NOVOLOG MIX 70/30) (70-30) 100 UNIT/ML injection INJECT 40 UNITS SUBCUTANEOUSLY WITH BREAKFAST AND INJECT 25 UNITS SUBCUTANEOUSLY WITH DINNER TO CONTROL BLOOD SUGAR. 02/07/19   [name], NP  lubiprostone (AMITIZA) 24 MCG capsule Take 1 capsule (24 mcg total) by mouth 2 (two) times daily. 11/01/18   [name], NP  metoprolol succinate (TOPROL-XL) 25 MG 24 hr tablet TAKE 1 TABLET BY MOUTH ONCE DAILY WITH OR IMMEDIATELY FOLLOWING A MEAL FOR BLOOD PRESSURE 02/07/19   [name], NP  potassium chloride SA (K-DUR,KLOR-CON) 20 MEQ tablet Take 2 tablets (40 mEq total) by mouth daily. 12/06/18   [name], NP  simvastatin (ZOCOR) 40 MG tablet Take 1 tablet (40 mg total) by mouth daily. 10/21/18   [name], NP  sucralfate (CARAFATE) 1 g tablet TAKE 1 TABLET BY  MOUTH TWICE DAILY ON EMPTY STOMACH 02/07/19   [name], NP  timolol (TIMOPTIC) 0.5 % ophthalmic solution INSTILL 1 DROP INTO EACH EYE TWICE A DAY 02/12/16   [name], NP  TRAVATAN Z 0.004 % SOLN ophthalmic solution Place 1 drop into both eyes at bedtime.  04/09/16   [provider]    Physical Exam:  Constitutional: NAD, calm, comfortable Vitals:   02/10/19 1645 02/10/19 1700 02/10/19 1730 02/10/19 1800  BP: (!) 131/55 (!) 116/59 117/62   Pulse: 80 82 79 86  Resp: 13  (!) 22 17  Temp:      TempSrc:      SpO2: 93% 94% 96% 96%   Eyes: PERRL, lids and conjunctivae normal ENMT: Mucous membranes are moist. Posterior pharynx clear of any exudate or lesions.Normal dentition.  Neck: normal, supple, no masses, no thyromegaly Respiratory: clear to auscultation bilaterally, no wheezing, no crackles. Normal respiratory effort. No accessory muscle use.  Cardiovascular: Regular rate and rhythm, no murmurs / rubs / gallops. No extremity edema. 2+ pedal pulses. No carotid bruits.  Abdomen: mild diffuse tenderness, no masses palpated. No hepatosplenomegaly. Bowel sounds  positive.  Musculoskeletal: no clubbing / cyanosis. No joint deformity upper and lower extremities. Good ROM, no contractures. Normal muscle tone.  Skin: no rashes, lesions, ulcers. No induration Neurologic: CN 2-12 grossly intact. Sensation intact, DTR normal. Strength 5/5 in all 4.  Psychiatric: Normal judgment and insight. Alert and oriented x 3. Normal mood.    Labs on Admission: I have personally reviewed following labs and imaging studies  CBC: Recent Labs  Lab 02/10/19 1327 02/10/19 1632  WBC 12.2*  --   HGB 10.6* 10.9*  HCT 33.8* 32.0*  MCV 86.2  --   PLT 364  --    Basic Metabolic Panel: Recent Labs  Lab 02/10/19 1327 02/10/19 1632  NA 135 137  K 3.6 3.8  CL 112*  --   CO2 14*  --   GLUCOSE 88  --   BUN 49*  --   CREATININE 5.66*  --   CALCIUM 7.4*  --    GFR: Estimated Creatinine Clearance: 8.4 mL/min (A) (by C-G formula based on SCr of 5.66 mg/dL (H)). Liver Function Tests: Recent Labs  Lab 02/10/19 1327  AST 20  ALT 10  ALKPHOS 133*  BILITOT 0.5  PROT 7.0  ALBUMIN 3.1*   Recent Labs  Lab 02/10/19 1327  LIPASE 24   CBG: Recent Labs  Lab 02/10/19 1320 02/10/19 1450 02/10/19 1629 02/10/19 1817  GLUCAP 83 62* 65* 71   Urine analysis:    Component Value Date/Time   COLORURINE STRAW (A) 05/06/2017 1033   APPEARANCEUR CLEAR 05/06/2017 1033   LABSPEC 1.010 05/06/2017 1033   PHURINE 5.0 05/06/2017 1033   GLUCOSEU NEGATIVE 05/06/2017 1033   HGBUR NEGATIVE 05/06/2017 1033   BILIRUBINUR NEGATIVE 05/06/2017 1033   KETONESUR NEGATIVE 05/06/2017 1033   PROTEINUR NEGATIVE 05/06/2017 1033   UROBILINOGEN 0.2 02/14/2008 0947   NITRITE NEGATIVE 05/06/2017 1033   LEUKOCYTESUR NEGATIVE 05/06/2017 1033    Radiological Exams on Admission: Ct Abdomen Pelvis Wo Contrast  Result Date: 02/10/2019 CLINICAL DATA:  Nausea and vomiting for 1 week. Lower abdominal pain and poor appetite. EXAM: CT ABDOMEN AND PELVIS WITHOUT CONTRAST TECHNIQUE: Multidetector  CT imaging of the abdomen and pelvis was performed following the standard protocol without IV contrast. COMPARISON:  CT scan 05/03/2017 FINDINGS: Lower chest: Streaky bibasilar atelectasis but no infiltrates or effusions. Coronary artery calcifications  are noted. There is a large hiatal hernia. Hepatobiliary: No definite hepatic lesions without contrast. The gallbladder is grossly normal. No common bile duct dilatation. Pancreas: Significant fatty atrophy but no mass or ductal dilatation. Spleen: Normal size.  No focal liver lesions. Adrenals/Urinary Tract: Stable left adrenal gland lesions consistent with benign adenomas. The right adrenal gland is unremarkable. Persistent right renal calculi. The largest calculus measures 18 mm. No hydronephrosis or obstructing ureteral calculi. No left-sided renal or ureteral calculi. No bladder calculi or obvious bladder mass. Stomach/Bowel: The stomach, duodenum, small bowel and colon are grossly normal without oral contrast. Significant sigmoid colon diverticulosis but no definite findings for acute diverticulitis. There are some scattered air-fluid levels in the transverse colon which can be seen with diarrhea. The terminal ileum appears normal. Vascular/Lymphatic: Advanced atherosclerotic calcifications involving the aorta and iliac arteries. No aneurysm. No mesenteric or retroperitoneal mass or adenopathy. Small scattered lymph nodes are noted. Reproductive: Surgically absent. Other: Examination of the lower pelvis is somewhat limited by bilateral hip prosthesis. Musculoskeletal: Severe scoliosis and degenerative lumbar spondylosis but no acute bony findings. IMPRESSION: 1. Right renal calculi but no obstructing ureteral calculi and no definite renal or bladder mass. 2. Large hiatal hernia. 3. No acute abdominal/pelvic findings, mass lesions or lymphadenopathy. 4. Sigmoid colon diverticulosis without definite findings for acute diverticulitis. Electronically Signed   By: Marijo Sanes M.D.   On: 02/10/2019 18:04     Assessment/Plan Principal Problem:   Acute renal failure superimposed on stage 4 chronic kidney disease (HCC) Active Problems:   Metabolic acidosis   Chronic kidney disease (CKD), stage IV (severe) (HCC)   Diabetes mellitus with diabetic nephropathy, with long-term current use of insulin (HCC)   OSA (obstructive sleep apnea)   Essential hypertension, benign   Morbid obesity ([place])    1.  Acute renal failure superimposed on stage IV chronic kidney disease.  Creatinine has gone up from mid threes to 5.6.  Has not yet had urine in the emergency department.  Given her history of stage IV chronic kidney disease we will give judicious fluids normal saline at a rate of 75 mL an hour for 36 hours.  Recheck lab work in a.m. and monitor abdominal situation.  If pain persists may benefit from GI consult.  2.  Metabolic acidosis: Likely related to dehydration with nausea and vomiting and diarrhea.  Recheck labs in a.m.  3.  Chronic kidney disease stage IV: Noted acute worsening related to acute illness.  4.  Diabetes mellitus with diabetic nephropathy on long-term current insulin.  Fingerstick blood glucoses before meals and at bedtime with sliding scale as per protocol.  5.  GI symptoms with associated nausea vomiting and diarrhea: We will check a GI.  Which will also check for C. difficile.  6.  Obstructive sleep apnea: We will ask patient to obtain her CPAP machine from home and use that.  7.  Essential hypertension: Noted blood pressures currently stable.  8.  Morbid obesity: Noted patient would benefit from weight reduction.     DVT prophylaxis: Subcu heparin Code Status: Full code. Family Communication: Spoke with visitor who was present I believe it is her son.  Patient retains capacity. Disposition Plan: Likely home in 3 to 4 days Consults called: None for now Admission status: Inpatient   Lady Deutscher MD [place]  Hospitalists Pager [phone]  How to contact the [facility] Attending or Consulting provider [place] or covering provider during after hours [place],  for this patient?  1. Check the care team in [facility] and look for a) attending/consulting [facility] provider listed and b) the [facility] team listed 2. Log into [email or web address] and use [place]'s universal password to access. If you do not have the password, please contact the hospital operator. 3. Locate the [facility] provider you are looking for under [facility] and page to a number that you can be directly reached. 4. If you still have difficulty reaching the provider, please page the [facility] (Director on Call) for the Hospitalists listed on amion for assistance.  If 7PM-7AM, please contact night-coverage [email or web address] Password [facility]  02/10/2019, 7:06 PM

## 2019-02-11 DIAGNOSIS — E1121 Type 2 diabetes mellitus with diabetic nephropathy: Secondary | ICD-10-CM

## 2019-02-11 DIAGNOSIS — Z794 Long term (current) use of insulin: Secondary | ICD-10-CM

## 2019-02-11 DIAGNOSIS — E872 Acidosis: Secondary | ICD-10-CM

## 2019-02-11 LAB — CBC
HCT: 30.9 % — ABNORMAL LOW (ref 36.0–46.0)
HEMOGLOBIN: 9.9 g/dL — AB (ref 12.0–15.0)
MCH: 27 pg (ref 26.0–34.0)
MCHC: 32 g/dL (ref 30.0–36.0)
MCV: 84.2 fL (ref 80.0–100.0)
Platelets: 297 10*3/uL (ref 150–400)
RBC: 3.67 MIL/uL — ABNORMAL LOW (ref 3.87–5.11)
RDW: 16.3 % — ABNORMAL HIGH (ref 11.5–15.5)
WBC: 8.4 10*3/uL (ref 4.0–10.5)
nRBC: 0.2 % (ref 0.0–0.2)

## 2019-02-11 LAB — URINALYSIS, ROUTINE W REFLEX MICROSCOPIC
BILIRUBIN URINE: NEGATIVE
Glucose, UA: NEGATIVE mg/dL
Ketones, ur: NEGATIVE mg/dL
Leukocytes,Ua: NEGATIVE
NITRITE: NEGATIVE
Protein, ur: NEGATIVE mg/dL
Specific Gravity, Urine: 1.006 (ref 1.005–1.030)
pH: 5 (ref 5.0–8.0)

## 2019-02-11 LAB — COMPREHENSIVE METABOLIC PANEL
ALT: 10 U/L (ref 0–44)
AST: 17 U/L (ref 15–41)
Albumin: 2.8 g/dL — ABNORMAL LOW (ref 3.5–5.0)
Alkaline Phosphatase: 123 U/L (ref 38–126)
Anion gap: 9 (ref 5–15)
BUN: 44 mg/dL — ABNORMAL HIGH (ref 8–23)
CALCIUM: 7.1 mg/dL — AB (ref 8.9–10.3)
CO2: 15 mmol/L — AB (ref 22–32)
Chloride: 112 mmol/L — ABNORMAL HIGH (ref 98–111)
Creatinine, Ser: 5.26 mg/dL — ABNORMAL HIGH (ref 0.44–1.00)
GFR calc Af Amer: 8 mL/min — ABNORMAL LOW (ref >60)
GFR calc non Af Amer: 7 mL/min — ABNORMAL LOW (ref >60)
Glucose, Bld: 82 mg/dL (ref 70–99)
Potassium: 3.6 mmol/L (ref 3.5–5.1)
Sodium: 136 mmol/L (ref 135–145)
Total Bilirubin: 0.4 mg/dL (ref 0.3–1.2)
Total Protein: 6.3 g/dL — ABNORMAL LOW (ref 6.5–8.1)

## 2019-02-11 LAB — BASIC METABOLIC PANEL
Anion gap: 9 (ref 5–15)
BUN: 46 mg/dL — ABNORMAL HIGH (ref 8–23)
CO2: 16 mmol/L — ABNORMAL LOW (ref 22–32)
CREATININE: 5.23 mg/dL — AB (ref 0.44–1.00)
Calcium: 7.2 mg/dL — ABNORMAL LOW (ref 8.9–10.3)
Chloride: 112 mmol/L — ABNORMAL HIGH (ref 98–111)
GFR calc Af Amer: 8 mL/min — ABNORMAL LOW (ref >60)
GFR calc non Af Amer: 7 mL/min — ABNORMAL LOW (ref >60)
Glucose, Bld: 83 mg/dL (ref 70–99)
Potassium: 3.7 mmol/L (ref 3.5–5.1)
Sodium: 137 mmol/L (ref 135–145)

## 2019-02-11 LAB — BASIC METABOLIC PANEL WITH GFR
BUN/Creatinine Ratio: 9 (calc) (ref 6–22)
BUN: 49 mg/dL — ABNORMAL HIGH (ref 7–25)
CO2: 15 mmol/L — ABNORMAL LOW (ref 20–32)
Calcium: 7.5 mg/dL — ABNORMAL LOW (ref 8.6–10.4)
Chloride: 111 mmol/L — ABNORMAL HIGH (ref 98–110)
Creat: 5.47 mg/dL — ABNORMAL HIGH (ref 0.60–0.88)
GFR, EST AFRICAN AMERICAN: 8 mL/min/{1.73_m2} — AB (ref >=60)
GFR, Est Non African American: 7 mL/min/{1.73_m2} — ABNORMAL LOW (ref >=60)
Glucose, Bld: 44 mg/dL — ABNORMAL LOW (ref 65–139)
Potassium: 3.6 mmol/L (ref 3.5–5.3)
Sodium: 139 mmol/L (ref 135–146)

## 2019-02-11 LAB — GLUCOSE, CAPILLARY
GLUCOSE-CAPILLARY: 121 mg/dL — AB (ref 70–99)
Glucose-Capillary: 62 mg/dL — ABNORMAL LOW (ref 70–99)
Glucose-Capillary: 73 mg/dL (ref 70–99)
Glucose-Capillary: 87 mg/dL (ref 70–99)
Glucose-Capillary: 117 mg/dL — ABNORMAL HIGH (ref 70–99)

## 2019-02-11 MED ORDER — FAMOTIDINE 20 MG IN NS 100 ML IVPB
20.0000 mg | INTRAVENOUS | Status: DC
  Administered 2019-02-11 – 2019-02-12 (×2): 20 mg via INTRAVENOUS
  Filled 2019-02-11 (×2): qty 100

## 2019-02-11 MED ORDER — LUBIPROSTONE 24 MCG PO CAPS
24.0000 ug | ORAL_CAPSULE | Freq: Two times a day (BID) | ORAL | Status: DC
  Administered 2019-02-11 – 2019-02-15 (×8): 24 ug via ORAL
  Filled 2019-02-11 (×8): qty 1

## 2019-02-11 MED ORDER — CILOSTAZOL 100 MG PO TABS
100.0000 mg | ORAL_TABLET | Freq: Every day | ORAL | Status: DC
  Administered 2019-02-11 – 2019-02-14 (×4): 100 mg via ORAL
  Filled 2019-02-11 (×4): qty 1

## 2019-02-11 MED ORDER — METOPROLOL SUCCINATE ER 25 MG PO TB24
25.0000 mg | ORAL_TABLET | Freq: Every day | ORAL | Status: DC
  Administered 2019-02-11 – 2019-02-14 (×4): 25 mg via ORAL
  Filled 2019-02-11 (×4): qty 1

## 2019-02-11 MED ORDER — BISACODYL 10 MG RE SUPP: 10.0000 mg | Freq: Every day | RECTAL | Status: DC | PRN

## 2019-02-11 MED ORDER — BUDESONIDE 0.25 MG/2ML IN SUSP: 0.2500 mg | Freq: Two times a day (BID) | RESPIRATORY_TRACT | Status: DC

## 2019-02-11 MED ORDER — METOPROLOL SUCCINATE ER 25 MG PO TB24: 25.0000 mg | ORAL_TABLET | Freq: Every day | ORAL | Status: DC

## 2019-02-11 MED ORDER — SODIUM BICARBONATE 650 MG PO TABS
650.0000 mg | ORAL_TABLET | Freq: Three times a day (TID) | ORAL | Status: DC
  Administered 2019-02-11 – 2019-02-12 (×3): 650 mg via ORAL
  Filled 2019-02-11 (×3): qty 1

## 2019-02-11 MED ORDER — SUCRALFATE 1 G PO TABS
1.0000 g | ORAL_TABLET | Freq: Two times a day (BID) | ORAL | Status: DC
  Administered 2019-02-11 – 2019-02-14 (×6): 1 g via ORAL
  Filled 2019-02-11 (×7): qty 1

## 2019-02-11 MED ORDER — LATANOPROST 0.005 % OP SOLN
1.0000 [drp] | Freq: Every day | OPHTHALMIC | Status: DC
  Administered 2019-02-11 – 2019-02-14 (×4): 1 [drp] via OPHTHALMIC
  Filled 2019-02-11: qty 2.5

## 2019-02-11 MED ORDER — ASPIRIN EC 81 MG PO TBEC
81.0000 mg | DELAYED_RELEASE_TABLET | Freq: Every day | ORAL | Status: DC
  Administered 2019-02-11 – 2019-02-14 (×4): 81 mg via ORAL
  Filled 2019-02-11 (×4): qty 1

## 2019-02-11 MED ORDER — PANTOPRAZOLE SODIUM 40 MG PO TBEC
40.0000 mg | DELAYED_RELEASE_TABLET | Freq: Every day | ORAL | Status: DC
  Administered 2019-02-11 – 2019-02-14 (×4): 40 mg via ORAL
  Filled 2019-02-11 (×4): qty 1

## 2019-02-11 MED ORDER — DULOXETINE HCL 30 MG PO CPEP
30.0000 mg | ORAL_CAPSULE | Freq: Every day | ORAL | Status: DC
  Administered 2019-02-11 – 2019-02-15 (×5): 30 mg via ORAL
  Filled 2019-02-11 (×5): qty 1

## 2019-02-11 MED ORDER — BUDESONIDE 0.25 MG/2ML IN SUSP
0.2500 mg | Freq: Two times a day (BID) | RESPIRATORY_TRACT | Status: DC
  Administered 2019-02-11 – 2019-02-15 (×8): 0.25 mg via RESPIRATORY_TRACT
  Filled 2019-02-11 (×8): qty 2

## 2019-02-11 MED ORDER — TIMOLOL MALEATE 0.5 % OP SOLN
1.0000 [drp] | Freq: Two times a day (BID) | OPHTHALMIC | Status: DC
  Administered 2019-02-11 – 2019-02-15 (×8): 1 [drp] via OPHTHALMIC
  Filled 2019-02-11: qty 5

## 2019-02-11 MED ORDER — SIMVASTATIN 20 MG PO TABS
40.0000 mg | ORAL_TABLET | Freq: Every day | ORAL | Status: DC
  Administered 2019-02-11 – 2019-02-13 (×3): 40 mg via ORAL
  Filled 2019-02-11 (×3): qty 2

## 2019-02-11 MED ORDER — POLYETHYLENE GLYCOL 3350 17 G PO PACK: 17.0000 g | PACK | Freq: Every day | ORAL | Status: DC | PRN

## 2019-02-11 MED ORDER — SENNOSIDES-DOCUSATE SODIUM 8.6-50 MG PO TABS
1.0000 | ORAL_TABLET | Freq: Two times a day (BID) | ORAL | Status: DC
  Administered 2019-02-11 – 2019-02-15 (×9): 1 via ORAL
  Filled 2019-02-11 (×9): qty 1

## 2019-02-11 MED ORDER — HYDROCODONE-ACETAMINOPHEN 10-325 MG PO TABS
1.0000 | ORAL_TABLET | Freq: Four times a day (QID) | ORAL | Status: DC | PRN
  Administered 2019-02-11 – 2019-02-12 (×2): 1 via ORAL
  Filled 2019-02-11 (×2): qty 1

## 2019-02-11 MED ORDER — HYDRALAZINE HCL 50 MG PO TABS
50.0000 mg | ORAL_TABLET | Freq: Three times a day (TID) | ORAL | Status: DC
  Administered 2019-02-11 – 2019-02-12 (×3): 50 mg via ORAL
  Filled 2019-02-11 (×3): qty 1

## 2019-02-11 MED ORDER — MORPHINE SULFATE (PF) 2 MG/ML IV SOLN
2.0000 mg | Freq: Once | INTRAVENOUS | Status: AC
  Administered 2019-02-11: 2 mg via INTRAVENOUS
  Filled 2019-02-11: qty 1

## 2019-02-11 NOTE — Progress Notes (Signed)
Patient taking Norco at home for chronic pain per pt and sister.   Ok per MD to resume medication.

## 2019-02-11 NOTE — Progress Notes (Signed)
   02/10/19 2132  Vitals  Temp (!) 97.5 F (36.4 C)  Temp Source Oral  BP 124/64  MAP (mmHg) 73  BP Location Right Arm  BP Method Automatic  Patient Position (if appropriate) Lying  Pulse Rate 62  Pulse Rate Source Monitor  Resp 18  Oxygen Therapy  SpO2 99 %  O2 Device Room Air  Height and Weight  Height 5\' 4"  (1.626 m)  Weight 96.2 kg  Type of Scale Used Bed  Type of Weight Actual  BSA (Calculated - sq m) 2.08 sq meters  BMI (Calculated) 36.37  Weight in (lb) to have BMI = 25 145.3  MEWS Score  MEWS RR 0  MEWS Pulse 0  MEWS Systolic 0  MEWS LOC 0  MEWS Temp 0  MEWS Score 0  MEWS Score Color Green  Patient arrived to the unit with sister.  VSS.  Call bell within reach.  Patient placed on telemetry and verified by Jazz, NT and Caryl Pina, RN with CCMD.  Pt unable to do standing wait at this time.

## 2019-02-11 NOTE — Progress Notes (Signed)
   Vital Signs MEWS/VS Documentation      02/11/2019 0817 02/11/2019 1203 02/11/2019 1459 02/11/2019 1547   MEWS Score:  0  1  4  1    MEWS Score Color:  Green  Green  Red  Green   Resp:  -  (!) 22  -  -   Pulse:  77  75  -  -   BP:  (!) 114/58  (!) 121/98  -  -   Temp:  -  98 F (36.7 C)  -  -   O2 Device:  Nasal Cannula  Nasal Cannula  -  -   O2 Flow Rate (L/min):  2 L/min  2 L/min  -  -     CCMD documented HR of 141- pt is currently at 73, comfortable in bed.        Trixie Maclaren L Emalyn Schou 02/11/2019,3:48 PM

## 2019-02-11 NOTE — Progress Notes (Signed)
Hypoglycemic Event  CBG: 50  Treatment: one cup of juice.   Symptoms: none  Follow-up CBG: Time: 2241 CBG Result: 75  Possible Reasons for Event: pt had not eaten since coming to the ED  Comments/MD notified:     Bethel Lions

## 2019-02-11 NOTE — Progress Notes (Signed)
Patient has not had a BM in 24 hours or more.  Pt is on stool softeners due to chronic pain medications.  Has to have stool softener in order to have BM.  Per pt's sister- pt has not had any loose stool at all.

## 2019-02-11 NOTE — Progress Notes (Signed)
RN and NT not comfortable doing standing weight with patient.  Patient can only pivot to the bedside commode with max assist.  Unable to step up at this time.  Patient does not walk at home and uses motorized wheelchair at home.

## 2019-02-11 NOTE — Progress Notes (Signed)
Hypoglycemic Event  CBG: 62  Treatment: one cup of juice  Symptoms: nausea  Follow-up CBG: Time: 0630 CBG Result:73  Possible Reasons for Event: lack of food intake and pt states CBG drops at times  Comments/MD notified:    [place] Lions

## 2019-02-11 NOTE — Progress Notes (Signed)
PROGRESS NOTE    Michelle Buck  QZR:[id number] DOB: 07-04-1936 DOA: 02/10/2019 PCP: [name], NP   Brief Narrative: 83 year old female with history of diabetes, COPD, CHF systolic/diastolic dysfunction, diaphragmatic hernia hyperlipidemia, HTN, CKD stage IV, OSA who was having abdominal pain nausea vomiting diarrhea for 2 weeks, decreased appetite, sent from PCP office to the ER.  In the ER found to have AKI, metabolic acidosis and was admitted for further management.  Subjective: Patient has no complaints.  Feels unwell overall.  Per nursing voiding well, was given nausea medication.  No vomiting diarrhea fever chills.    Assessment & Plan:   Acute renal failure superimposed on stage 4 chronic kidney disease: Baseline creatinine 2.5-3.31 October 2018 to February 2020.  On admission 5.4.  Suspecting due to prerenal volume depletion secondary to nausea vomiting diarrhea.  Admitted with normal saline at 1 5 mL/h.  No recurrence of vomiting and diarrhea.  Tolerating p.o.  Repeat chemistry level pending.  Patient is voiding, will monitor closely. Recent Labs  Lab 02/10/19 1117 02/10/19 1327 02/10/19 2151  CREATININE 5.47* 5.66* 3.54*     Metabolic acidosis: in the Setting of AKI along with CKD. Based on labs will consider p.o. or IV bicarbonate.  GI symptoms with nausea vomiting diarrhea.  No recurrence of symptoms.  Pending C. difficile and GI panel if she has diarrhea. Cont  Supportive care, ivf and Zofran, diet as tolerated and pepcid.  Essential hypertension, benign: Blood pressure well controlled.  No meds currently.  Morbid obesity with BMI 36.5.  Advised weight loss, healthy lifestyle.  Diabetes mellitus with diabetic nephropathy, with long-term current use of insulin: With hypoglycemia in 50s.  Insulin on hold, encourage oral intake.  Accu-Chek.  OSA (obstructive sleep apnea), can use home CPAP.  Repeat labs reviewed, bicarb improving to 16, creatinine still at 5.2. We  will continue IV fluid hydration, supportive care, po bicarb.  DVT prophylaxis: Heparin Code Status: full Family Communication: no family at bedside Disposition Plan: remains inpatient pending clinical improvement.  Consultants:  None  Procedures:  Antimicrobials: Anti-infectives (From admission, onward)   None       Objective: Vitals:   02/11/19 0026 02/11/19 0548 02/11/19 0730 02/11/19 0817  BP: (!) 128/53 (!) 152/74 (!) 123/56 (!) 114/58  Pulse: 79 75 81 77  Resp: 18 20 20    Temp: (!) 97.4 F (36.3 C) 97.8 F (36.6 C) 98 F (36.7 C)   TempSrc: Oral Oral Oral   SpO2: 99% 99% 99% 100%  Weight: 96.5 kg     Height:        Intake/Output Summary (Last 24 hours) at 02/11/2019 0845 Last data filed at 02/11/2019 0626 Gross per 24 hour  Intake 2061.08 ml  Output 650 ml  Net 1411.08 ml   Filed Weights   02/10/19 2132 02/11/19 0026  Weight: 96.2 kg 96.5 kg   Weight change:   Body mass index is 36.53 kg/m.  Intake/Output from previous day: 03/13 0701 - 03/14 0700 In: 2061.1 [P.O.:462; I.V.:599.1; IV Piggyback:1000] Out: 650 [Urine:650] Intake/Output this shift: No intake/output data recorded.  Examination:  General exam: Appears calm and comfortable, obese, not in distress, older fore the age HEENT:PERRL,Oral mucosa moist, Ear/Nose normal on gross exam Respiratory system: Bilateral equal air entry, normal vesicular breath sounds, no wheezes or crackles  Cardiovascular system: S1 & S2 heard,No JVD, murmurs. Gastrointestinal system: Abdomen is  soft, non tender, non distended, BS +  Nervous System:Alert and oriented. No focal neurological deficits/moving  extremities, sensation intact. Extremities: No edema, no clubbing, distal peripheral pulses palpable. Skin: No rashes, lesions, no icterus MSK: Normal muscle bulk,tone ,power  Medications:  Scheduled Meds:  heparin  5,000 Units Subcutaneous Q8H   insulin aspart  0-15 Units Subcutaneous TID WC   insulin  aspart  0-5 Units Subcutaneous QHS   sodium chloride flush  3 mL Intravenous Once   sodium chloride flush  3 mL Intravenous Q12H   Continuous Infusions:  sodium chloride 75 mL/hr at 02/10/19 2222    Data Reviewed: I have personally reviewed following labs and imaging studies  CBC: Recent Labs  Lab 02/10/19 1327 02/10/19 1632 02/10/19 2151 02/11/19 0827  WBC 12.2*  --  10.6* 8.4  HGB 10.6* 10.9* 10.4* 9.9*  HCT 33.8* 32.0* 32.5* 30.9*  MCV 86.2  --  83.1 84.2  PLT 364  --  295 694   Basic Metabolic Panel: Recent Labs  Lab 02/10/19 1117 02/10/19 1327 02/10/19 1632 02/10/19 2151  NA 139 135 137  --   K 3.6 3.6 3.8  --   CL 111* 112*  --   --   CO2 15* 14*  --   --   GLUCOSE 44* 88  --   --   BUN 49* 49*  --   --   CREATININE 5.47* 5.66*  --  5.31*  CALCIUM 7.5* 7.4*  --   --    GFR: Estimated Creatinine Clearance: 9.5 mL/min (A) (by C-G formula based on SCr of 5.31 mg/dL (H)). Liver Function Tests: Recent Labs  Lab 02/10/19 1327  AST 20  ALT 10  ALKPHOS 133*  BILITOT 0.5  PROT 7.0  ALBUMIN 3.1*   Recent Labs  Lab 02/10/19 1327  LIPASE 24   No results for input(s): AMMONIA in the last 168 hours. Coagulation Profile: No results for input(s): INR, PROTIME in the last 168 hours. Cardiac Enzymes: No results for input(s): CKTOTAL, CKMB, CKMBINDEX, TROPONINI in the last 168 hours. BNP (last 3 results) No results for input(s): PROBNP in the last 8760 hours. HbA1C: No results for input(s): HGBA1C in the last 72 hours. CBG: Recent Labs  Lab 02/10/19 1817 02/10/19 2207 02/10/19 2241 02/11/19 0606 02/11/19 0629  GLUCAP 71 50* 75 62* 73   Lipid Profile: No results for input(s): CHOL, HDL, LDLCALC, TRIG, CHOLHDL, LDLDIRECT in the last 72 hours. Thyroid Function Tests: No results for input(s): TSH, T4TOTAL, FREET4, T3FREE, THYROIDAB in the last 72 hours. Anemia Panel: No results for input(s): VITAMINB12, FOLATE, FERRITIN, TIBC, IRON, RETICCTPCT in the  last 72 hours. Sepsis Labs: Recent Labs  Lab 02/10/19 1623 02/10/19 2151  LATICACIDVEN 0.7 1.0    No results found for this or any previous visit (from the past 240 hour(s)).    Radiology Studies: Ct Abdomen Pelvis Wo Contrast  Result Date: 02/10/2019 CLINICAL DATA:  Nausea and vomiting for 1 week. Lower abdominal pain and poor appetite. EXAM: CT ABDOMEN AND PELVIS WITHOUT CONTRAST TECHNIQUE: Multidetector CT imaging of the abdomen and pelvis was performed following the standard protocol without IV contrast. COMPARISON:  CT scan 05/03/2017 FINDINGS: Lower chest: Streaky bibasilar atelectasis but no infiltrates or effusions. Coronary artery calcifications are noted. There is a large hiatal hernia. Hepatobiliary: No definite hepatic lesions without contrast. The gallbladder is grossly normal. No common bile duct dilatation. Pancreas: Significant fatty atrophy but no mass or ductal dilatation. Spleen: Normal size.  No focal liver lesions. Adrenals/Urinary Tract: Stable left adrenal gland lesions consistent with benign adenomas. The right  adrenal gland is unremarkable. Persistent right renal calculi. The largest calculus measures 18 mm. No hydronephrosis or obstructing ureteral calculi. No left-sided renal or ureteral calculi. No bladder calculi or obvious bladder mass. Stomach/Bowel: The stomach, duodenum, small bowel and colon are grossly normal without oral contrast. Significant sigmoid colon diverticulosis but no definite findings for acute diverticulitis. There are some scattered air-fluid levels in the transverse colon which can be seen with diarrhea. The terminal ileum appears normal. Vascular/Lymphatic: Advanced atherosclerotic calcifications involving the aorta and iliac arteries. No aneurysm. No mesenteric or retroperitoneal mass or adenopathy. Small scattered lymph nodes are noted. Reproductive: Surgically absent. Other: Examination of the lower pelvis is somewhat limited by bilateral hip  prosthesis. Musculoskeletal: Severe scoliosis and degenerative lumbar spondylosis but no acute bony findings. IMPRESSION: 1. Right renal calculi but no obstructing ureteral calculi and no definite renal or bladder mass. 2. Large hiatal hernia. 3. No acute abdominal/pelvic findings, mass lesions or lymphadenopathy. 4. Sigmoid colon diverticulosis without definite findings for acute diverticulitis. Electronically Signed   By: Marijo Sanes M.D.   On: 02/10/2019 18:04      LOS: 1 day   Time spent: More than 50% of that time was spent in counseling and/or coordination of care.  Antonieta Pert, MD [facility]  02/11/2019, 8:45 AM

## 2019-02-12 ENCOUNTER — Inpatient Hospital Stay (HOSPITAL_COMMUNITY): Payer: Medicare HMO

## 2019-02-12 DIAGNOSIS — I10 Essential (primary) hypertension: Secondary | ICD-10-CM

## 2019-02-12 DIAGNOSIS — Z515 Encounter for palliative care: Secondary | ICD-10-CM

## 2019-02-12 DIAGNOSIS — G4733 Obstructive sleep apnea (adult) (pediatric): Secondary | ICD-10-CM

## 2019-02-12 LAB — CBC
HCT: 33 % — ABNORMAL LOW (ref 36.0–46.0)
Hemoglobin: 10 g/dL — ABNORMAL LOW (ref 12.0–15.0)
MCH: 26 pg (ref 26.0–34.0)
MCHC: 30.3 g/dL (ref 30.0–36.0)
MCV: 85.9 fL (ref 80.0–100.0)
Platelets: 354 10*3/uL (ref 150–400)
RBC: 3.84 MIL/uL — ABNORMAL LOW (ref 3.87–5.11)
RDW: 16.4 % — AB (ref 11.5–15.5)
WBC: 12.4 10*3/uL — ABNORMAL HIGH (ref 4.0–10.5)
nRBC: 0.7 % — ABNORMAL HIGH (ref 0.0–0.2)

## 2019-02-12 LAB — BASIC METABOLIC PANEL
Anion gap: 13 (ref 5–15)
BUN: 46 mg/dL — ABNORMAL HIGH (ref 8–23)
CO2: 12 mmol/L — ABNORMAL LOW (ref 22–32)
CREATININE: 5.43 mg/dL — AB (ref 0.44–1.00)
Calcium: 7.1 mg/dL — ABNORMAL LOW (ref 8.9–10.3)
Chloride: 112 mmol/L — ABNORMAL HIGH (ref 98–111)
GFR calc Af Amer: 8 mL/min — ABNORMAL LOW (ref >60)
GFR calc non Af Amer: 7 mL/min — ABNORMAL LOW (ref >60)
Glucose, Bld: 150 mg/dL — ABNORMAL HIGH (ref 70–99)
Potassium: 4.6 mmol/L (ref 3.5–5.1)
Sodium: 137 mmol/L (ref 135–145)

## 2019-02-12 LAB — BLOOD GAS, ARTERIAL
Acid-base deficit: 14.5 mmol/L — ABNORMAL HIGH (ref 0.0–2.0)
Acid-base deficit: 13.9 mmol/L — ABNORMAL HIGH (ref 0.0–2.0)
Bicarbonate: 12.6 mmol/L — ABNORMAL LOW (ref 20.0–28.0)
Bicarbonate: 13.5 mmol/L — ABNORMAL LOW (ref 20.0–28.0)
DELIVERY SYSTEMS: POSITIVE
Drawn by: 441371
Drawn by: 54717
Expiratory PAP: 5
FIO2: 21
FIO2: 0.4
Inspiratory PAP: 12
O2 SAT: 92.8 %
O2 Saturation: 98.1 %
PATIENT TEMPERATURE: 97.7
Patient temperature: 98.6
pCO2 arterial: 36.6 mmHg (ref 32.0–48.0)
pCO2 arterial: 41.9 mmHg (ref 32.0–48.0)
pH, Arterial: 7.159 — CL (ref 7.350–7.450)
pH, Arterial: 7.134 — CL (ref 7.350–7.450)
pO2, Arterial: 72.7 mmHg — ABNORMAL LOW (ref 83.0–108.0)
pO2, Arterial: 150 mmHg — ABNORMAL HIGH (ref 83.0–108.0)

## 2019-02-12 LAB — GLUCOSE, CAPILLARY
Glucose-Capillary: 135 mg/dL — ABNORMAL HIGH (ref 70–99)
Glucose-Capillary: 100 mg/dL — ABNORMAL HIGH (ref 70–99)
Glucose-Capillary: 89 mg/dL (ref 70–99)
Glucose-Capillary: 98 mg/dL (ref 70–99)

## 2019-02-12 LAB — TSH: TSH: 1.88 u[IU]/mL (ref 0.350–4.500)

## 2019-02-12 LAB — IRON AND TIBC
Iron: 47 ug/dL (ref 28–170)
Saturation Ratios: 22 % (ref 10.4–31.8)
TIBC: 211 ug/dL — ABNORMAL LOW (ref 250–450)
UIBC: 164 ug/dL

## 2019-02-12 LAB — URINE CULTURE: Culture: NO GROWTH

## 2019-02-12 LAB — VITAMIN B12: Vitamin B-12: 272 pg/mL (ref 180–914)

## 2019-02-12 LAB — SODIUM, URINE, RANDOM: Sodium, Ur: <10 mmol/L

## 2019-02-12 LAB — AMMONIA: AMMONIA: 33 umol/L (ref 9–35)

## 2019-02-12 MED ORDER — FUROSEMIDE 10 MG/ML IJ SOLN
120.0000 mg | Freq: Four times a day (QID) | INTRAVENOUS | Status: DC
  Administered 2019-02-12 – 2019-02-14 (×8): 120 mg via INTRAVENOUS
  Filled 2019-02-12: qty 12
  Filled 2019-02-12 (×3): qty 10
  Filled 2019-02-12 (×3): qty 12
  Filled 2019-02-12: qty 10
  Filled 2019-02-12 (×2): qty 12
  Filled 2019-02-12: qty 4

## 2019-02-12 MED ORDER — SODIUM BICARBONATE 8.4 % IV SOLN
INTRAVENOUS | Status: DC
  Administered 2019-02-12 – 2019-02-14 (×3): via INTRAVENOUS
  Filled 2019-02-12 (×4): qty 150

## 2019-02-12 NOTE — Progress Notes (Signed)
MD ordered BIPAP for patient with critical metabolic acidosis ABG. MD paged to clarify orders. The patient's family states that she does not tolerate her home CPAP. The patient is currently sitting up, in no respiratory distress, on no oxygen, and eating supper. The BIPAP cannot be placed at least 30 minutes after the patient eats supper. Awaiting call from MD.

## 2019-02-12 NOTE — Progress Notes (Signed)
Patient seems more confused today.   Slow cognition and motor responses.   Pt is A& O x 3, but having trouble processing commands (ex swallowing pills).  Performed mouth care  Patient reported she had to urinate.  Place patient on [facility], could not urinate at this time.   Per tech.- has not emptied any urine this AM.   Palpated bladder- painful with palpation.  Check Bladder scan- 126.  Paged [employer]otor

## 2019-02-12 NOTE — Progress Notes (Addendum)
RT called- patient has critical values of pH 7.159; Bicarb 12.6, PCO2 37.5  Called MD- MD aware

## 2019-02-12 NOTE — Progress Notes (Signed)
Palliative:  Consult received and chart reviewed. Patient unable to participate in [place] conversation d/t altered mental status. Have left messages with sisters. Will plan on [facility] discussion 3/16.  Thank you for this consult.  Juel Burrow, DNP, AGNP-C Palliative Medicine Team Team Phone # [phone]  Pager # [phone]

## 2019-02-12 NOTE — Progress Notes (Signed)
Received call from Physician asking for repeat ABG ASAP.  Verbal ordered received to change pt's NIV settings from BIPAP to CPAP.  ABG drawn and sent to lab.  Pt placed on CPAP 10, 30% FIO2.  Tolerating well.  Will cont to monitor.

## 2019-02-12 NOTE — Progress Notes (Addendum)
RT spoke with Antonieta Pert, MD concerning BIPAP order. The MD would like to try the BIPAP to "decrease the patient's CO2 to help the pH increase." RT suggested giving the patient HCO3-. MD continues to request the BIPAP. The BIPAP will be placed in approximately 30 minutes due to the patient eating supper.

## 2019-02-12 NOTE — Consult Note (Addendum)
Referring Provider: No ref. provider found Primary Care Physician:  [name], NP Primary Nephrologist:  D  Reason for Consultation: Stage IV chronic kidney disease, maintenance of euvolemia, assessment of electrolyte abnormalities, correction of acid-base balance, management of anemia and evaluation and treatment of secondary hyperparathyroidism  HPI: Is an 83 year old lady with a history of diabetes COPD on home O2 congestive heart failure with diastolic dysfunction last 2D echo was performed in October 2017 that revealed an ejection fraction of 60 to 65% has a diaphragmatic hernia hyperlipidemia, hypertension stage IV chronic kidney disease.  She was admitted with nausea abdominal pain vomiting and diarrhea for 2 weeks.  She underwent abdominal CT 02/10/2019, this showed a right renal calculi nonobstructing large hiatal hernia and sigmoid diverticulosis without any findings of acute diverticulitis.  There was no definitive renal or bladder mass.  Urinalysis was positive for small blood with 6-10 RBCs and no WBCs per high-powered film.  Same was true of urinalysis performed 05/03/2017.  Labs sodium 137 potassium 4.6 chloride 112 CO2 12 BUN 46 creatinine 5.43 calcium 7.1 WBC 12.4 hemoglobin 10.0 platelets 354 glucose 150  Medications aspirin 81 mg daily hydralazine 50 mg 3 times daily metoprolol 25 mg daily Zocor 40 mg daily Cymbalta 30 mg daily insulin sliding scale, famotidine 20 mg IV every 24 hours Amitiza 24 mcg twice daily Protonix 40 mg daily sodium bicarbonate 650 mg 3 times daily Carafate 1 g twice daily Pletal 100 mg daily Pulmicort nebs twice daily  Blood pressure 111/94 pulse 96 temperature 97.7 O2 sats 94% room air  Baseline creatinine 2.5-3.5 her last creatinine in February 2020 was 3.5.  It gives a stage IV/V chronic kidney disease.  Urine output 850 cc 02/11/2019.   Past Medical History:  Diagnosis Date  . Acute bronchitis   . Anxiety   . Arthritis   . Chronic kidney  disease, stage II (mild)   . Complications affecting other specified body systems, hypertension   . Congestive heart failure, unspecified    stress test in March at [facility]  . COPD (chronic obstructive pulmonary disease) ([place])    present for several years, diagnosed in the last few years  . Degenerative arthritis of knee, bilateral   . Diabetes mellitus   . Diaphragmatic hernia without mention of obstruction or gangrene   . Disorder of bone and cartilage, unspecified   . GERD (gastroesophageal reflux disease)   . Hiatal hernia   . Hyperlipidemia   . Hypertension   . Hypopotassemia   . Insomnia, unspecified   . Lumbago   . Obese   . Osteoarthrosis, unspecified whether generalized or localized, unspecified site   . PONV (postoperative nausea and vomiting)   . Scoliosis   . Secondary diabetes mellitus with renal manifestations, not stated as uncontrolled, or unspecified(249.40)   . Unspecified glaucoma(365.9)   . Unspecified hereditary and idiopathic peripheral neuropathy     Past Surgical History:  Procedure Laterality Date  . ABDOMINAL HYSTERECTOMY    . COLONOSCOPY  08/06/2011  . ESOPHAGOGASTRODUODENOSCOPY (EGD) WITH PROPOFOL N/A 05/26/2018   Procedure: ESOPHAGOGASTRODUODENOSCOPY (EGD) WITH PROPOFOL;  Surgeon: Milus Banister, MD;  Location: [facility];  Service: Endoscopy;  Laterality: N/A;  . FOOT SURGERY    . KNEE ARTHROSCOPY     bilateral  . SHOULDER SURGERY    . TOTAL HIP ARTHROPLASTY     bilateral    Prior to Admission medications   Medication Sig Start Date End Date Taking? Authorizing Provider  aspirin EC  81 MG tablet Take 1 tablet (81 mg total) by mouth daily. 01/27/19  Yes [name], NP  budesonide (PULMICORT) 0.25 MG/2ML nebulizer solution USE 1 AMPULE IN NEBULIZER TWICE DAILY ROUTINELY FOR COPD Patient taking differently: Take 0.25 mg by nebulization 2 (two) times daily.  05/13/18  Yes Tanda Rockers, MD  cilostazol (PLETAL) 100 MG tablet  TAKE 1 TABLET BY MOUTH ONCE DAILY Patient taking differently: Take 100 mg by mouth daily.  02/07/19  Yes [name], NP  cycloSPORINE (RESTASIS) 0.05 % ophthalmic emulsion Place 1 drop into both eyes 2 (two) times daily.   Yes [provider]  DULoxetine (CYMBALTA) 30 MG capsule TAKE 1 CAPSULE BY MOUTH EVERY DAY TO HELP ANXIETY AND PAINS. Patient taking differently: Take 30 mg by mouth daily.  02/07/19  Yes [name], NP  esomeprazole (NEXIUM) 40 MG capsule TAKE 1 CAPSULE BY MOUTH ONCE DAILY Patient taking differently: Take 40 mg by mouth daily.  02/07/19  Yes [name], NP  hydrALAZINE (APRESOLINE) 50 MG tablet TAKE 1 TABLET BY MOUTH THREE TIMES A DAY Patient taking differently: Take 50 mg by mouth 3 (three) times daily.  02/07/19  Yes [name], NP  HYDROcodone-acetaminophen (NORCO) 10-325 MG tablet Take 1 tablet by mouth every 6 (six) hours as needed. 01/27/19  Yes [name], NP  insulin aspart protamine- aspart (NOVOLOG MIX 70/30) (70-30) 100 UNIT/ML injection INJECT 40 UNITS SUBCUTANEOUSLY WITH BREAKFAST AND INJECT 25 UNITS SUBCUTANEOUSLY WITH DINNER TO CONTROL BLOOD SUGAR. 02/07/19  Yes [name], NP  lubiprostone (AMITIZA) 24 MCG capsule Take 1 capsule (24 mcg total) by mouth 2 (two) times daily. 11/01/18  Yes [name], NP  metoprolol succinate (TOPROL-XL) 25 MG 24 hr tablet TAKE 1 TABLET BY MOUTH ONCE DAILY WITH OR IMMEDIATELY FOLLOWING A MEAL FOR BLOOD PRESSURE Patient taking differently: Take 25 mg by mouth daily.  02/07/19  Yes [name], NP  potassium chloride SA (K-DUR,KLOR-CON) 20 MEQ tablet Take 2 tablets (40 mEq total) by mouth daily. Patient taking differently: Take 20 mEq by mouth 3 (three) times daily.  12/06/18  Yes [name], NP  simvastatin (ZOCOR) 40 MG tablet Take 1 tablet (40 mg total) by mouth daily. 10/21/18  Yes [name], NP  sucralfate (CARAFATE) 1 g tablet TAKE 1 TABLET BY MOUTH  TWICE DAILY ON EMPTY STOMACH Patient taking differently: Take 1 g by mouth 2 (two) times daily.  02/07/19  Yes [name], NP  timolol (TIMOPTIC) 0.5 % ophthalmic solution INSTILL 1 DROP INTO EACH EYE TWICE A DAY Patient taking differently: Place 1 drop into both eyes 2 (two) times daily.  02/12/16  Yes Eubanks, Carlos American, NP  TRAVATAN Z 0.004 % SOLN ophthalmic solution Place 1 drop into both eyes at bedtime.  04/09/16  Yes [provider]    Current Facility-Administered Medications  Medication Dose Route Frequency Provider Last Rate Last Dose  . aspirin EC tablet 81 mg  81 mg Oral Daily Kc, Ramesh, MD   81 mg at 02/12/19 1050  . bisacodyl (DULCOLAX) suppository 10 mg  10 mg Rectal Daily PRN Kc, Ramesh, MD      . budesonide (PULMICORT) nebulizer solution 0.25 mg  0.25 mg Nebulization BID Kc, Ramesh, MD   0.25 mg at 02/12/19 0819  . cilostazol (PLETAL) tablet 100 mg  100 mg Oral Daily Kc, Ramesh, MD   100 mg at 02/12/19 1050  . DULoxetine (CYMBALTA)  DR capsule 30 mg  30 mg Oral Daily Kc, Ramesh, MD   30 mg at 02/12/19 1050  . famotidine (PEPCID) IVPB 20 mg in NS 100 mL IVPB  20 mg Intravenous Q24H Antonieta Pert, MD   Stopped at 02/11/19 1101  . heparin injection 5,000 Units  5,000 Units Subcutaneous Q8H Kc, Ramesh, MD   5,000 Units at 02/12/19 0617  . hydrALAZINE (APRESOLINE) tablet 50 mg  50 mg Oral TID Antonieta Pert, MD   50 mg at 02/12/19 1051  . HYDROcodone-acetaminophen (NORCO) 10-325 MG per tablet 1 tablet  1 tablet Oral Q6H PRN Antonieta Pert, MD   1 tablet at 02/12/19 0025  . insulin aspart (novoLOG) injection 0-15 Units  0-15 Units Subcutaneous TID WC Antonieta Pert, MD   2 Units at 02/12/19 0630  . insulin aspart (novoLOG) injection 0-5 Units  0-5 Units Subcutaneous QHS Kc, Ramesh, MD      . latanoprost (XALATAN) 0.005 % ophthalmic solution 1 drop  1 drop Both Eyes QHS Kc, Ramesh, MD   1 drop at 02/11/19 2214  . lubiprostone (AMITIZA) capsule 24 mcg  24 mcg Oral BID Antonieta Pert, MD   24  mcg at 02/12/19 1051  . metoprolol succinate (TOPROL-XL) 24 hr tablet 25 mg  25 mg Oral Daily Kc, Ramesh, MD   25 mg at 02/12/19 1051  . ondansetron (ZOFRAN) tablet 4 mg  4 mg Oral Q6H PRN Kc, Ramesh, MD       Or  . ondansetron (ZOFRAN) injection 4 mg  4 mg Intravenous Q6H PRN Antonieta Pert, MD   4 mg at 02/11/19 0611  . pantoprazole (PROTONIX) EC tablet 40 mg  40 mg Oral Daily Kc, Ramesh, MD   40 mg at 02/12/19 1050  . polyethylene glycol (MIRALAX / GLYCOLAX) packet 17 g  17 g Oral Daily PRN Kc, Ramesh, MD      . senna-docusate (Senokot-S) tablet 1 tablet  1 tablet Oral BID Antonieta Pert, MD   1 tablet at 02/12/19 1050  . simvastatin (ZOCOR) tablet 40 mg  40 mg Oral q1800 Antonieta Pert, MD   40 mg at 02/11/19 1825  . sodium bicarbonate tablet 650 mg  650 mg Oral TID Antonieta Pert, MD   650 mg at 02/12/19 1051  . sodium chloride flush (NS) 0.9 % injection 3 mL  3 mL Intravenous Once Kc, Ramesh, MD      . sodium chloride flush (NS) 0.9 % injection 3 mL  3 mL Intravenous Q12H Antonieta Pert, MD   3 mL at 02/11/19 2212  . sucralfate (CARAFATE) tablet 1 g  1 g Oral BID AC Kc, Ramesh, MD   1 g at 02/12/19 0618  . timolol (TIMOPTIC) 0.5 % ophthalmic solution 1 drop  1 drop Both Eyes BID Kc, Ramesh, MD   1 drop at 02/12/19 1051    Allergies as of 02/10/2019 - Review Complete 02/10/2019  Allergen Reaction Noted  . Fluticasone-salmeterol  03/09/2016  . Tramadol Other (See Comments) 02/21/2014  . Codeine Nausea And Vomiting and Other (See Comments) 05/22/2011  . Hydrocodone Nausea And Vomiting 02/29/2012  . Penicillins Rash and Other (See Comments) 05/22/2011  . Tylenol [acetaminophen] Hives 02/28/2012    Family History  Problem Relation Age of Onset  . Diabetes Mother   . Heart disease Mother   . Heart disease Brother   . Diabetes Brother   . Kidney disease Father   . Scoliosis Sister   . Diabetes Brother   . Diabetes  Brother   . Heart disease Brother   . Scoliosis Brother   . Stroke Brother   . Heart  attack Brother     Social History   Socioeconomic History  . Marital status: Widowed    Spouse name: Not on file  . Number of children: Not on file  . Years of education: Not on file  . Highest education level: Not on file  Occupational History  . Occupation: Retired    Fish farm manager: UNEMPLOYED  Social Needs  . Financial resource strain: Not very hard  . Food insecurity:    Worry: Never true    Inability: Never true  . Transportation needs:    Medical: No    Non-medical: No  Tobacco Use  . Smoking status: Former Smoker    Packs/day: 0.50    Years: 3.00    Pack years: 1.50    Types: Cigarettes    Last attempt to quit: 02/27/1997    Years since quitting: 21.9  . Smokeless tobacco: Never Used  . Tobacco comment: remote history - stopped 19 years ago; 20 pack year history  Substance and Sexual Activity  . Alcohol use: No    Alcohol/week: 0.0 standard drinks  . Drug use: No  . Sexual activity: Never  Lifestyle  . Physical activity:    Days per week: 0 days    Minutes per session: 0 min  . Stress: Not at all  Relationships  . Social connections:    Talks on phone: More than three times a week    Gets together: More than three times a week    Attends religious service: Never    Active member of club or organization: No    Attends meetings of clubs or organizations: Never    Relationship status: Widowed  . Intimate partner violence:    Fear of current or ex partner: No    Emotionally abused: No    Physically abused: No    Forced sexual activity: No  Other Topics Concern  . Not on file  Social History Narrative   Widowed   No children   Never held a steady job    Review of Systems: Gen: No fever sweats chills positive fatigue and weakness.  Patient lethargic and weak HEENT: No visual complaints, No history of Retinopathy. Normal external appearance No Epistaxis or Sore throat. No sinusitis.   CV: Denies chest pain, angina, palpitations, syncope, orthopnea, PND,  peripheral edema, and claudication. Resp: Patient is dyspneic with no cough or sputum GI: Denies vomiting blood, jaundice, and fecal incontinence.   Denies dysphagia or odynophagia. GU : Patient unable to void.  Bladder scan 150 cc of urine MS: Denies joint pain, limitation of movement, and swelling, stiffness, low back pain, extremity pain.  Diffuse weakness Derm: Denies rash, itching, dry skin, hives, moles, warts, or unhealing ulcers.  Heme: Denies bruising, bleeding, and enlarged lymph nodes. Neuro: No headache.  No diplopia. No dysarthria.  No dysphasia.  No history of CVA.  No Seizures. No paresthesias.  Weakness Endocrine diabetes.  No Thyroid disease.  No Adrenal disease.  Physical Exam: Vital signs in last 24 hours: Temp:  [97.7 F (36.5 C)-98.1 F (36.7 C)] 97.7 F (36.5 C) (03/15 1210) Pulse Rate:  [81-110] 88 (03/15 1210) Resp:  [18-20] 20 (03/15 1210) BP: (97-119)/(49-94) 100/63 (03/15 1210) SpO2:  [92 %-95 %] 94 % (03/15 1210) Weight:  [98.6 kg] 98.6 kg (03/15 0500) Last BM Date: 02/09/19 General:   Ill-appearing lady very poor  historian Head:  Normocephalic and atraumatic. Eyes:  Sclera clear, no icterus.   Pale conjunctiva Ears:  Normal auditory acuity. Nose:  No deformity, discharge,  or lesions. Mouth:  No deformity or lesions, dentition normal. Neck:  Supple; no masses or thyromegaly.  JVP elevated to jaw Lungs: Diminished breath sounds throughout with fine crackles at base Heart: Regular rate and rhythm faint systolic 3 / 6 murmur left sternal edge Abdomen: Bowel sounds present obese no palpable organomegaly Msk:  Symmetrical without gross deformities. Normal posture. Pulses:  No carotid, renal, femoral bruits. DP and PT symmetrical and equal Extremities: 1+ edema Neurologic: Globally depressed Skin:  Intact without significant lesions or rashes. Psych:  Alert and cooperative.  Flat affect  Intake/Output from previous day: 03/14 0701 - 03/15 0700 In:  1025.8 [P.O.:360; I.V.:573; IV Piggyback:92.8] Out: 400 [Urine:400] Intake/Output this shift: Total I/O In: 240 [P.O.:240] Out: -   Lab Results: Recent Labs    02/10/19 2151 02/11/19 0827 02/12/19 0710  WBC 10.6* 8.4 12.4*  HGB 10.4* 9.9* 10.0*  HCT 32.5* 30.9* 33.0*  PLT 295 297 354   BMET Recent Labs    02/10/19 1327 02/10/19 1632 02/10/19 2151 02/11/19 0827 02/12/19 0529  NA 135 137  --  136  137 137  K 3.6 3.8  --  3.6  3.7 4.6  CL 112*  --   --  112*  112* 112*  CO2 14*  --   --  15*  16* 12*  GLUCOSE 88  --   --  82  83 150*  BUN 49*  --   --  44*  46* 46*  CREATININE 5.66*  --  5.31* 5.26*  5.23* 5.43*  CALCIUM 7.4*  --   --  7.1*  7.2* 7.1*   LFT Recent Labs    02/11/19 0827  PROT 6.3*  ALBUMIN 2.8*  AST 17  ALT 10  ALKPHOS 123  BILITOT 0.4   PT/INR No results for input(s): LABPROT, INR in the last 72 hours. Hepatitis Panel No results for input(s): HEPBSAG, HCVAB, HEPAIGM, HEPBIGM in the last 72 hours.  Studies/Results: Ct Abdomen Pelvis Wo Contrast  Result Date: 02/10/2019 CLINICAL DATA:  Nausea and vomiting for 1 week. Lower abdominal pain and poor appetite. EXAM: CT ABDOMEN AND PELVIS WITHOUT CONTRAST TECHNIQUE: Multidetector CT imaging of the abdomen and pelvis was performed following the standard protocol without IV contrast. COMPARISON:  CT scan 05/03/2017 FINDINGS: Lower chest: Streaky bibasilar atelectasis but no infiltrates or effusions. Coronary artery calcifications are noted. There is a large hiatal hernia. Hepatobiliary: No definite hepatic lesions without contrast. The gallbladder is grossly normal. No common bile duct dilatation. Pancreas: Significant fatty atrophy but no mass or ductal dilatation. Spleen: Normal size.  No focal liver lesions. Adrenals/Urinary Tract: Stable left adrenal gland lesions consistent with benign adenomas. The right adrenal gland is unremarkable. Persistent right renal calculi. The largest calculus  measures 18 mm. No hydronephrosis or obstructing ureteral calculi. No left-sided renal or ureteral calculi. No bladder calculi or obvious bladder mass. Stomach/Bowel: The stomach, duodenum, small bowel and colon are grossly normal without oral contrast. Significant sigmoid colon diverticulosis but no definite findings for acute diverticulitis. There are some scattered air-fluid levels in the transverse colon which can be seen with diarrhea. The terminal ileum appears normal. Vascular/Lymphatic: Advanced atherosclerotic calcifications involving the aorta and iliac arteries. No aneurysm. No mesenteric or retroperitoneal mass or adenopathy. Small scattered lymph nodes are noted. Reproductive: Surgically absent. Other: Examination of the  lower pelvis is somewhat limited by bilateral hip prosthesis. Musculoskeletal: Severe scoliosis and degenerative lumbar spondylosis but no acute bony findings. IMPRESSION: 1. Right renal calculi but no obstructing ureteral calculi and no definite renal or bladder mass. 2. Large hiatal hernia. 3. No acute abdominal/pelvic findings, mass lesions or lymphadenopathy. 4. Sigmoid colon diverticulosis without definite findings for acute diverticulitis. Electronically Signed   By: Marijo Sanes M.D.   On: 02/10/2019 18:04    Assessment/Plan:  Acute on chronic kidney disease age for now appears to have progressed to stage V.  The does not appear to be any precipitant except for this may just be progression of her kidney failure.  CT scan did not reveal any evidence of hydronephrosis.  We will check urine sodium.  Check a serum plateletpheresis as well as immunofixation.  Urinalysis did reveal some hematuria but this appears to be present for some time based on epic records.  Will need to engage with the family regarding end-of-life issues.  She does not appear to be a great candidate for dialysis.  She may be starting to get uremic and if dialysis is decided then we will need to initiate  while she is in the hospital.  Will avoid ACE inhibitors, angiotensin receptor blockers, nonsteroidal inflammatory drugs, Cox 2 inhibitors and IV contrast  Hypertension/volume.  I will DC her IV fluids.  She appears to be volume overloaded I would need Foley catheter to be placed to monitor I's and O's.  Would also use Lasix 120 mg every 6 hours.  Anemia we will check iron studies  Bones will check PTH  Metabolic acidosis patient on oral bicarbonate.  Recommend checking ABG  Congestive heart failure.  She has some diastolic dysfunction I think she is decompensated will need to check chest x-ray.  Altered mental status probably secondary to uremia/metabolic.  Will check thyroid function studies and ammonia.  B12 and folate will defer to primary team regarding head CT.  Would avoid narcotics for pain.  Have discontinued these.  Depression continue Cymbalta   Diabetes mellitus as per primary team avoid metformin based products.  Disposition.  Discussed with primary service Dr. Maren Beach.  Recommend patient is transferred to the stepdown.  I believe that we need discussions with the family regarding goals of care.  No family at bedside this afternoon.  Will need to address CODE STATUS.  Recommend palliative medicine consult   LOS: 2 Sherril Croon @TODAY @12 :46 PM

## 2019-02-12 NOTE — Progress Notes (Signed)
Paged by primary MD Dr. Maren Beach regarding pts worsening Metabolic Acidosis. Her PH on previous ABG was 7.13 down from 7.15. She was on BiPAP to try and help correct her [place] with no improvement.  She is currently on a Bicarb gtt as well as a Lasix gtt since pt and sister have refused dialysis. I spoke with Dr. Genevive Bi with PCCM regarding pts worsening PH and increasing acidosis. After reviewing the chart she feels that the pts acidosis is metabolically driven and there are no further interventions necessary a this time. We will continue to monitor.  Arby Barrette AGPCNP-BC, AGNP-C [facility] Pager [phone]

## 2019-02-12 NOTE — Progress Notes (Addendum)
PROGRESS NOTE    Michelle Buck  GYK:[id number] DOB: [birth year] DOA: 02/10/2019 PCP: [name], NP   Brief Narrative: 83 year old female with history of diabetes, COPD, CHF systolic/diastolic dysfunction, diaphragmatic hernia hyperlipidemia, HTN, CKD stage IV, OSA who was having abdominal pain nausea vomiting  for [redacted] weeks along with decreased appetite, was sent from PCP office to the ER.  In the ER found to have AKI on CKD, metabolic acidosis and was admitted for further management. Patient creat is not much better and also with acidosis. Nephrology was consulted 3/15 After disscusison w patient and sister- changed to DNR.  Subjective: Patient seen and examined, appeared weak and lethargic. Denies nausea vomiting chest pain.  No bowel movement yet.Not voided well today.She appears somewhat more weak and lethargic and confuised today   Assessment & Plan:   Acute renal failure superimposed on stage IV/V chronic kidney disease: Baseline creatinine 2.5-3.31 October 2018 to February 2020.  On admission 5.4.Suspecting due to prerenal volume depletion secondary to nausea vomiting vs progression of her CKD.  Creatinine not much improved w ivf. No recurrence of nausea vomiting, tolerating diet.  UA with no WBC, negative protein.  CT abdomen pelvis and admission showed nonobstructive right renal calculi.  Requested consultation with nephrology Dr. Justin Mend.  Per nephrology concerned about fluid overload, checking chest x-ray DC'd IV fluids. Her wt is slightly up 212-->218 lb.  Recent Labs  Lab 02/10/19 1117 02/10/19 1327 02/10/19 2151 02/11/19 0827 02/12/19 0529  CREATININE 5.47* 5.66* 5.31* 5.26*   7.93* 9.03*   Metabolic acidosis: in the Setting of AKI along with CKD.  Bicarb this morning low at 12, on oral bicarbonate, gap is 13. ABG ordered-Await further nephrology recommendations.  GI symptoms with nausea vomiting at home:no recurrence of symptoms.  Canceled C. difficile and GI panel as  patient does not have any diarrhea.  Continue supportive care IV fluid hydration, Pepcid.  Patient has no specific tenderness,has vague abdominal discomfort.  CT abdomen pelvis on admission no acute finding. Patient with diffuse abdominal pain today and tenderness, and also having constipation, will repeat CT abdomen and was reviewed w no acute findings.  Acute encephalopathy, likely uremic encephalopathy.  Nonfocal on exam, ammonia level normal.patient is intermittently confused. Again reevaluated this afternoon was alert awake. Obtain ABG to evaluate given her low bicarb.  Generalized weakness/debility appears more lethargic and weak today.  Continue supportive care.  Essential hypertension, benign: Controlled. I will hold hydralazine. Cont  metoprolol.   Morbid obesity with BMI 36.5.  Advised weight loss, healthy lifestyle.  Diabetes mellitus with diabetic nephropathy, with long-term current use of insulin: With hypoglycemia in 50s 3/14. Blood sugar currently controlled, insulin on hold, encourage oral intake. Monitor  Accu-Chek.  OSA not using CPAP at home. On  [place]. Grade 2 diastolic dysfunction in TTE 08/2016 with pulm pressure upper limit of normal. Suspect underlying pulm htn with her OSA and not being compliant with CPAP.  Chronic pain medication use, resumed home Norco.  I will hold off on further narcotics.  Goals of care:I discussed with the patient she is clear that she does not want to be in dialysis.  Her symptoms may well be from uremia given her nausea worsening renal failure. I called and updated patient's sister who endorse that patient is DO NOT RESUSCITATE, I confirmed with the patient changed to DNR.  I will request palliative care consultation to discuss about end-of-life/goals of care given that patient does not want to be in dialysis and  seems to be a poor candidate as per nephrology.  This afternoon, I reviewed ABG W mixed acidosis mostly metabolic with renal failure. I  discussed w Dr Justin Mend- he recommended adding bicarbonate drip at 50 ml/hr. He has ordered patient on iv lasix infusion. Chest xray shows mild pulmonary congestion. Palliative care is also consulted. I again came back and discussed with patient and sister who has arrived at bedside-they are clear about not wanting long term dialysis and DNR and questioned about short term dialysis then  I called and spoke w Dr Justin Mend again and discussed family's wishes and concerns, given her ckd iv/v it is likely patient will need long term dialysis if she decides to undergo dialysis now as per Dr Justin Mend. I came and spoke to patient and sister and they do not want to undergo dialysis, and want to pursue medical management and will discuss with palliative care tomorrow. Per nephrology we will continue with bicarb and lasix drip.They would like the patient to be comfortable. I will also try bipap to see it helps her acidosis, but of note patient is not using CPAP as she does not like being on it. Per RN patient needs to go to progressive unit for bipap. ADDENDUM: I spoke w RT in evening for follow ABG at 7.30-and signed out to night covering PA for follow up on ABG, which is still low with ph 7.13. Night coverage in house to consult and d/w pulm/critical care for further recommendations and re BIPAP/CPAP and further plan as abg not much improved.  DVT prophylaxis: SQ Heparin Code Status: DNR Family Communication: no family at bedside. I called sister and discussed over the phone. Disposition Plan: remains inpatient. Overall prognosis is guarded and does not appear bright. I did explain to sister that patient is at risk for decompensation and she undertsands. I answered all her questions to the best of my ability. I also informed her that if she wanted to discuss with Nephrologist Dr Justin Mend personally now, he is available to talk to her.   Consultants:   Nephrology Palliative care  Procedures: CT abdomen: "Right renal  calculi but no obstructing ureteral calculi and no definite renal or bladder mass. 2. Large hiatal hernia. 3. No acute abdominal/pelvic findings, mass lesions or lymphadenopathy. 4. Sigmoid colon diverticulosis without definite findings for acute Diverticulitis."  CT abdomen IMPRESSION 02/12/19: 1. No acute abnormality. 2. Extensive sigmoid diverticulosis. 3. Large hiatal hernia. 4. Small right pleural effusion. 5. Mild bilateral lower lobe atelectasis. 6. Distended gallbladder containing sludge. 7. Right renal calculi Antimicrobials: Anti-infectives (From admission, onward)   None     Objective: Vitals:   02/12/19 0220 02/12/19 0500 02/12/19 0518 02/12/19 0819  BP: (!) 97/49  (!) 117/51   Pulse: (!) 104  (!) 110 (!) 101  Resp: 18  18 20   Temp: 98.1 F (36.7 C)  97.7 F (36.5 C)   TempSrc: Oral  Oral   SpO2: 94%  92% 94%  Weight:  98.6 kg    Height:        Intake/Output Summary (Last 24 hours) at 02/12/2019 0945 Last data filed at 02/12/2019 0516 Gross per 24 hour  Intake 822 ml  Output 400 ml  Net 422 ml   Filed Weights   02/10/19 2132 02/11/19 0026 02/12/19 0500  Weight: 96.2 kg 96.5 kg 98.6 kg   Weight change: 2.438 kg  Body mass index is 37.31 kg/m.  Intake/Output from previous day: 03/14 0701 - 03/15 0700 In: 1025.8 [P.O.:360;  I.V.:573; IV Piggyback:92.8] Out: 400 [Urine:400] Intake/Output this shift: No intake/output data recorded.  Examination:  General exam: Appears calm and comfortable, obese, somewhat lethargic,frail,older fore the age. Appears swollen. HEENT:PERRL,Oral mucosa moist, Ear/Nose normal on gross exam Respiratory system: Bilateral equal air entry, normal vesicular breath sounds, no wheezes or crackles  Cardiovascular system: S1 & S2 heard,No JVD, murmurs. Gastrointestinal system: Abdomen is  soft, mildly tender diffusely, obese, BS +  Nervous System:Alert and oriented to self, mildly confused.No focal neurological deficits/moving  extremities, sensation intact. Extremities: leg edema, no clubbing, distal peripheral pulses palpable. Skin: No rashes, lesions, no icterus MSK: Normal muscle bulk,tone ,power  Medications:  Scheduled Meds:  aspirin EC  81 mg Oral Daily   budesonide  0.25 mg Nebulization BID   cilostazol  100 mg Oral Daily   DULoxetine  30 mg Oral Daily   famotidine (PEPCID) IV  20 mg Intravenous Q24H   heparin  5,000 Units Subcutaneous Q8H   hydrALAZINE  50 mg Oral TID   insulin aspart  0-15 Units Subcutaneous TID WC   insulin aspart  0-5 Units Subcutaneous QHS   latanoprost  1 drop Both Eyes QHS   lubiprostone  24 mcg Oral BID   metoprolol succinate  25 mg Oral Daily   pantoprazole  40 mg Oral Daily   senna-docusate  1 tablet Oral BID   simvastatin  40 mg Oral q1800   sodium bicarbonate  650 mg Oral TID   sodium chloride flush  3 mL Intravenous Once   sodium chloride flush  3 mL Intravenous Q12H   sucralfate  1 g Oral BID AC   timolol  1 drop Both Eyes BID   Continuous Infusions:   Data Reviewed: I have personally reviewed following labs and imaging studies  CBC: Recent Labs  Lab 02/10/19 1327 02/10/19 1632 02/10/19 2151 02/11/19 0827 02/12/19 0710  WBC 12.2*  --  10.6* 8.4 12.4*  HGB 10.6* 10.9* 10.4* 9.9* 10.0*  HCT 33.8* 32.0* 32.5* 30.9* 33.0*  MCV 86.2  --  83.1 84.2 85.9  PLT 364  --  295 297 811   Basic Metabolic Panel: Recent Labs  Lab 02/10/19 1117 02/10/19 1327 02/10/19 1632 02/10/19 2151 02/11/19 0827 02/12/19 0529  NA 139 135 137  --  136   137 137  K 3.6 3.6 3.8  --  3.6   3.7 4.6  CL 111* 112*  --   --  112*   112* 112*  CO2 15* 14*  --   --  15*   16* 12*  GLUCOSE 44* 88  --   --  82   83 150*  BUN 49* 49*  --   --  44*   46* 46*  CREATININE 5.47* 5.66*  --  5.31* 5.26*   5.23* 5.43*  CALCIUM 7.5* 7.4*  --   --  7.1*   7.2* 7.1*   GFR: Estimated Creatinine Clearance: 9.4 mL/min (A) (by C-G formula based on SCr of 5.43 mg/dL  (H)). Liver Function Tests: Recent Labs  Lab 02/10/19 1327 02/11/19 0827  AST 20 17  ALT 10 10  ALKPHOS 133* 123  BILITOT 0.5 0.4  PROT 7.0 6.3*  ALBUMIN 3.1* 2.8*   Recent Labs  Lab 02/10/19 1327  LIPASE 24   No results for input(s): AMMONIA in the last 168 hours. Coagulation Profile: No results for input(s): INR, PROTIME in the last 168 hours. Cardiac Enzymes: No results for input(s): CKTOTAL, CKMB, CKMBINDEX, TROPONINI in the  last 168 hours. BNP (last 3 results) No results for input(s): PROBNP in the last 8760 hours. HbA1C: No results for input(s): HGBA1C in the last 72 hours. CBG: Recent Labs  Lab 02/11/19 0629 02/11/19 1158 02/11/19 1648 02/11/19 2110 02/12/19 0625  GLUCAP 73 87 117* 121* 135*   Lipid Profile: No results for input(s): CHOL, HDL, LDLCALC, TRIG, CHOLHDL, LDLDIRECT in the last 72 hours. Thyroid Function Tests: No results for input(s): TSH, T4TOTAL, FREET4, T3FREE, THYROIDAB in the last 72 hours. Anemia Panel: No results for input(s): VITAMINB12, FOLATE, FERRITIN, TIBC, IRON, RETICCTPCT in the last 72 hours. Sepsis Labs: Recent Labs  Lab 02/10/19 1623 02/10/19 2151  LATICACIDVEN 0.7 1.0    No results found for this or any previous visit (from the past 240 hour(s)).    Radiology Studies: Ct Abdomen Pelvis Wo Contrast  Result Date: 02/10/2019 CLINICAL DATA:  Nausea and vomiting for 1 week. Lower abdominal pain and poor appetite. EXAM: CT ABDOMEN AND PELVIS WITHOUT CONTRAST TECHNIQUE: Multidetector CT imaging of the abdomen and pelvis was performed following the standard protocol without IV contrast. COMPARISON:  CT scan 05/03/2017 FINDINGS: Lower chest: Streaky bibasilar atelectasis but no infiltrates or effusions. Coronary artery calcifications are noted. There is a large hiatal hernia. Hepatobiliary: No definite hepatic lesions without contrast. The gallbladder is grossly normal. No common bile duct dilatation. Pancreas: Significant fatty  atrophy but no mass or ductal dilatation. Spleen: Normal size.  No focal liver lesions. Adrenals/Urinary Tract: Stable left adrenal gland lesions consistent with benign adenomas. The right adrenal gland is unremarkable. Persistent right renal calculi. The largest calculus measures 18 mm. No hydronephrosis or obstructing ureteral calculi. No left-sided renal or ureteral calculi. No bladder calculi or obvious bladder mass. Stomach/Bowel: The stomach, duodenum, small bowel and colon are grossly normal without oral contrast. Significant sigmoid colon diverticulosis but no definite findings for acute diverticulitis. There are some scattered air-fluid levels in the transverse colon which can be seen with diarrhea. The terminal ileum appears normal. Vascular/Lymphatic: Advanced atherosclerotic calcifications involving the aorta and iliac arteries. No aneurysm. No mesenteric or retroperitoneal mass or adenopathy. Small scattered lymph nodes are noted. Reproductive: Surgically absent. Other: Examination of the lower pelvis is somewhat limited by bilateral hip prosthesis. Musculoskeletal: Severe scoliosis and degenerative lumbar spondylosis but no acute bony findings. IMPRESSION: 1. Right renal calculi but no obstructing ureteral calculi and no definite renal or bladder mass. 2. Large hiatal hernia. 3. No acute abdominal/pelvic findings, mass lesions or lymphadenopathy. 4. Sigmoid colon diverticulosis without definite findings for acute diverticulitis. Electronically Signed   By: Marijo Sanes M.D.   On: 02/10/2019 18:04      LOS: 2 days   Time spent: More than 50% of that time was spent in counseling and/or coordination of care.  Antonieta Pert, MD [facility]  02/12/2019, 9:45 AM

## 2019-02-12 NOTE — Progress Notes (Signed)
RT spoke with again with Antonieta Pert, MD about patient wearing BIPAP. Dr. Maren Beach again insisted on patient wearing BIPAP for "CO2 retention" against RT advice. RT placed patient on the BIPAP at 1831. Dr. Maren Beach ordered a repeat ABG for 2030

## 2019-02-13 DIAGNOSIS — Z515 Encounter for palliative care: Secondary | ICD-10-CM

## 2019-02-13 DIAGNOSIS — Z7189 Other specified counseling: Secondary | ICD-10-CM

## 2019-02-13 DIAGNOSIS — N179 Acute kidney failure, unspecified: Secondary | ICD-10-CM

## 2019-02-13 LAB — COMPREHENSIVE METABOLIC PANEL
ALT: 10 U/L (ref 0–44)
AST: 15 U/L (ref 15–41)
Albumin: 2.5 g/dL — ABNORMAL LOW (ref 3.5–5.0)
Alkaline Phosphatase: 114 U/L (ref 38–126)
Anion gap: 10 (ref 5–15)
BUN: 49 mg/dL — ABNORMAL HIGH (ref 8–23)
CHLORIDE: 113 mmol/L — AB (ref 98–111)
CO2: 15 mmol/L — ABNORMAL LOW (ref 22–32)
Calcium: 6.9 mg/dL — ABNORMAL LOW (ref 8.9–10.3)
Creatinine, Ser: 5.88 mg/dL — ABNORMAL HIGH (ref 0.44–1.00)
GFR calc Af Amer: 7 mL/min — ABNORMAL LOW (ref >60)
GFR calc non Af Amer: 6 mL/min — ABNORMAL LOW (ref >60)
Glucose, Bld: 74 mg/dL (ref 70–99)
Potassium: 3.2 mmol/L — ABNORMAL LOW (ref 3.5–5.1)
Sodium: 138 mmol/L (ref 135–145)
Total Bilirubin: 0.4 mg/dL (ref 0.3–1.2)
Total Protein: 6.1 g/dL — ABNORMAL LOW (ref 6.5–8.1)

## 2019-02-13 LAB — CBC
HCT: 27.6 % — ABNORMAL LOW (ref 36.0–46.0)
Hemoglobin: 9.1 g/dL — ABNORMAL LOW (ref 12.0–15.0)
MCH: 27.2 pg (ref 26.0–34.0)
MCHC: 33 g/dL (ref 30.0–36.0)
MCV: 82.4 fL (ref 80.0–100.0)
PLATELETS: 263 10*3/uL (ref 150–400)
RBC: 3.35 MIL/uL — ABNORMAL LOW (ref 3.87–5.11)
RDW: 16.3 % — ABNORMAL HIGH (ref 11.5–15.5)
WBC: 9.1 10*3/uL (ref 4.0–10.5)
nRBC: 0.3 % — ABNORMAL HIGH (ref 0.0–0.2)

## 2019-02-13 LAB — PROTEIN ELECTROPHORESIS, SERUM
A/G Ratio: 0.9 (ref 0.7–1.7)
Albumin ELP: 2.7 g/dL — ABNORMAL LOW (ref 2.9–4.4)
Alpha-1-Globulin: 0.2 g/dL (ref 0.0–0.4)
Alpha-2-Globulin: 1.1 g/dL — ABNORMAL HIGH (ref 0.4–1.0)
Beta Globulin: 0.6 g/dL — ABNORMAL LOW (ref 0.7–1.3)
GAMMA GLOBULIN: 0.9 g/dL (ref 0.4–1.8)
Globulin, Total: 2.9 g/dL (ref 2.2–3.9)
TOTAL PROTEIN ELP: 5.6 g/dL — AB (ref 6.0–8.5)

## 2019-02-13 LAB — GLUCOSE, CAPILLARY
Glucose-Capillary: 64 mg/dL — ABNORMAL LOW (ref 70–99)
Glucose-Capillary: 103 mg/dL — ABNORMAL HIGH (ref 70–99)
Glucose-Capillary: 129 mg/dL — ABNORMAL HIGH (ref 70–99)
Glucose-Capillary: 94 mg/dL (ref 70–99)

## 2019-02-13 LAB — IMMUNOFIXATION ELECTROPHORESIS
IgA: 108 mg/dL (ref 64–422)
IgG (Immunoglobin G), Serum: 1184 mg/dL (ref 700–1600)
IgM (Immunoglobulin M), Srm: 82 mg/dL (ref 26–217)
TOTAL PROTEIN ELP: 6.2 g/dL (ref 6.0–8.5)

## 2019-02-13 LAB — FOLATE RBC
Folate, Hemolysate: 409 ng/mL
Folate, RBC: 1359 ng/mL (ref >498)
Hematocrit: 30.1 % — ABNORMAL LOW (ref 34.0–46.6)

## 2019-02-13 MED ORDER — HYDROCODONE-ACETAMINOPHEN 5-325 MG PO TABS
1.0000 | ORAL_TABLET | Freq: Once | ORAL | Status: AC
  Administered 2019-02-13: 1 via ORAL
  Filled 2019-02-13: qty 1

## 2019-02-13 MED ORDER — CYCLOBENZAPRINE HCL 10 MG PO TABS
10.0000 mg | ORAL_TABLET | Freq: Once | ORAL | Status: AC
  Administered 2019-02-13: 10 mg via ORAL
  Filled 2019-02-13: qty 1

## 2019-02-13 MED ORDER — POTASSIUM CHLORIDE CRYS ER 20 MEQ PO TBCR
40.0000 meq | EXTENDED_RELEASE_TABLET | Freq: Once | ORAL | Status: AC
  Administered 2019-02-13: 40 meq via ORAL
  Filled 2019-02-13: qty 2

## 2019-02-13 NOTE — Progress Notes (Signed)
Physical Therapy Evaluation [name] MRN: [id number] DOB: 1936/10/16 Today's Date: 02/13/2019   History of Present Illness  Patient is a 83 y/o female presenting to [facility] with abdominal pain nausea/vomiting secondary to metabolic acidosis and acute renal failure. CT of abdomen revealed large hiatal hernia, distended gallbladder, R renal calculi, and sigmoid diverticulosis. Pallative care consulted. PMH includes HTN, OSA, DM, COPD, and scoliosis.  Clinical Impression  Patient admitted to hospital secondary to problems above and with deficits below. Patient required heavy modA +2 to sit on the EOB. Patient reports not feeling upon sitting up and requesting to return to supine. Further mobility deferred. Given functional mobility deficits and decreased care giver assistance, recommending SNF level therapy following d/c. If patient refuses SNF will need max home health services. Patient will benefit from acute physical therapy to maximize independence and safety with functional mobility.     Follow Up Recommendations SNF;Supervision/Assistance - 24 hour    Equipment Recommendations  Other (comment)(TBD at next venue)    Recommendations for Other Services       Precautions / Restrictions Precautions Precautions: Fall Restrictions Weight Bearing Restrictions: No      Mobility  Bed Mobility Overal bed mobility: Needs Assistance Bed Mobility: Supine to Sit;Sit to Supine     Supine to sit: Mod assist;+2 for physical assistance Sit to supine: Min assist   General bed mobility comments: Patient required heavy modA +2 for trunk control to sit EOB. Patient reports not feeling well upon sitting up and requested to return to supine. Further mobility deferred. Required minA +2 for LE management and trunk control to return to supine.   Transfers                 General transfer comment: Deferred due to patient not feeling well  Ambulation/Gait                 Stairs            Wheelchair Mobility    Modified Rankin (Stroke Patients Only)       Balance Overall balance assessment: Needs assistance Sitting-balance support: Bilateral upper extremity supported;Feet supported Sitting balance-Leahy Scale: Poor Sitting balance - Comments: reliant on BUE support to maintain sitting balance                                     Pertinent Vitals/Pain Pain Assessment: Faces Faces Pain Scale: No hurt    Home Living Family/patient expects to be discharged to:: Private residence Living Arrangements: Alone Available Help at Discharge: Family;Personal care attendant;Available PRN/intermittently Type of Home: Apartment Home Access: Ramped entrance     Home Layout: One level Home Equipment: Electric scooter Additional Comments: Patient unable to answer all home information questions secondary to cognitive deficits. Family assited with answering information    Prior Function Level of Independence: Needs assistance   Gait / Transfers Assistance Needed: reports using a hoveround for mobility. Ambulated short distance in bathroom by holding onto door frames according to family.   ADL's / Homemaking Assistance Needed: Aide assisted with ADLs and IADLs M-F for 4 hours a day.        Hand Dominance        Extremity/Trunk Assessment   Upper Extremity Assessment Upper Extremity Assessment: Generalized weakness    Lower Extremity Assessment Lower Extremity Assessment: Generalized weakness    Cervical / Trunk Assessment Cervical / Trunk Assessment:  Kyphotic  Communication   Communication: No difficulties  Cognition Arousal/Alertness: Awake/alert Behavior During Therapy: WFL for tasks assessed/performed Overall Cognitive Status: Impaired/Different from baseline Area of Impairment: Orientation;Following commands;Problem solving                 Orientation Level: Disoriented to;Place;Situation;Time      Following Commands: Follows one step commands with increased time;Follows one step commands inconsistently     Problem Solving: Slow processing;Requires verbal cues General Comments: Patient oriented to self only. Required increased time and multimodal cues to follow commands. Unable to answer all home and PLOF questions. Family had to provide some answers.      General Comments General comments (skin integrity, edema, etc.): Patient family in room during session    Exercises     Assessment/Plan    PT Assessment Patient needs continued PT services  PT Problem List Decreased strength;Decreased activity tolerance;Decreased balance;Decreased mobility;Decreased cognition;Decreased knowledge of use of DME       PT Treatment Interventions DME instruction;Gait training;Functional mobility training;Therapeutic activities;Therapeutic exercise;Balance training;Cognitive remediation;Patient/family education    PT Goals (Current goals can be found in the Care Plan section)  Acute Rehab PT Goals Patient Stated Goal: go home PT Goal Formulation: With patient Time For Goal Achievement: 02/27/19 Potential to Achieve Goals: Fair    Frequency Min 3X/week   Barriers to discharge Decreased caregiver support      Co-evaluation               AM-PAC PT "6 Clicks" Mobility  Outcome Measure Help needed turning from your back to your side while in a flat bed without using bedrails?: A Lot Help needed moving from lying on your back to sitting on the side of a flat bed without using bedrails?: A Lot Help needed moving to and from a bed to a chair (including a wheelchair)?: Total Help needed standing up from a chair using your arms (e.g., wheelchair or bedside chair)?: Total Help needed to walk in hospital room?: Total Help needed climbing 3-5 steps with a railing? : Total 6 Click Score: 8    End of Session Equipment Utilized During Treatment: Gait belt Activity Tolerance: Treatment  limited secondary to medical complications (Comment)(Patient reports not feeling well) Patient left: in bed;with call bell/phone within reach;with bed alarm set;with family/visitor present Nurse Communication: Mobility status PT Visit Diagnosis: Other abnormalities of gait and mobility (R26.89);Muscle weakness (generalized) (M62.81);Difficulty in walking, not elsewhere classified (R26.2)    Time: 1230-1250 PT Time Calculation (min) (ACUTE ONLY): 20 min   Charges:   PT Evaluation $PT Eval Moderate Complexity: 1 Mod          Michelle Buck, SPT  Michelle Buck 02/13/2019, 1:36 PM

## 2019-02-13 NOTE — Progress Notes (Signed)
PROGRESS NOTE    Michelle Buck  WLS:[id number] DOB: 23-Sep-1936 DOA: 02/10/2019 PCP: [name], NP   Brief Narrative: 83 year old female with history of diabetes, COPD, CHF systolic/diastolic dysfunction, diaphragmatic hernia hyperlipidemia, HTN, CKD stage IV, OSA who was having abdominal pain nausea vomiting  for [redacted] weeks along with decreased appetite, was sent from PCP office to the ER.  In the ER found to have AKI on CKD, metabolic acidosis and was admitted for further management. Patient creat is not much better and also with acidosis. Nephrology was consulted 3/15 After disscusison w patient and sister- changed to DNR. Patient having persistent progressive worsening renal failure, also with severe metabolic acidosis. Currently on Lasix IV and bicarbonate drip. Palliative care has been consulted.  Subjective: Patient seen this morning x2, she is alert awake. Briefly used BiPAP and CPAP yesterday evening then has been on nasal cannula throughout the night.  Denies any specific pain or complaint.  Assessment & Plan:   Acute renal failure superimposed on stage IV/V chronic kidney disease: Baseline creatinine 2.5-3.31 October 2018 to February 2020. On admission 5.4.Suspecting due to prerenal volume depletion secondary to nausea vomiting vs progression of her CKD. Creatinine not much improved w ivf.UA with no WBC, negative protein.  CT abdomen pelvis and admission showed nonobstructive right renal calculi.  So far work-up negative for reversible.nephrology following. Patient desires not to have dialysis on long term. Making urine on iv lasix.Chest x-ray 3/15 w mild pulm congestion and her wt is up 212-->220 lb indicating fluid overload.  Creatinine trend as below and up trending.  Per nephrology no absolute acute indication for HD.   Recent Labs  Lab 02/10/19 1327 02/10/19 2151 02/11/19 0827 02/12/19 0529 02/13/19 0736  CREATININE 5.66* 5.31* 5.26*   5.23* 8.11* 5.72*   Metabolic  acidosis severe, non anion gap: from CKD. Bicarb was at 12 aNd ph 7.1, no change on bipap yesterday. Started on bicarb drip and Bicarb improved to 15 today.Appreciate nephrology on board. I had discussed with Dr. Justin Mend.  Since patient is making urine nephrology is increasing the bicarb infusion.  Defer to Nephrology for diuretics/fluiid management.  Hypokalemia will give 40 KCl potassium chloride.  GI symptoms with nausea vomiting at home:no recurrence of symptoms.  Canceled C. difficile and GI panel as patient does not have any diarrhea.  Continue supportive care IV fluid hydration, PPI. Patient has no specific tenderness,has vague abdominal discomfort.  CT abdomen pelvis on admission no acute finding and repat ct w no acute findings, GB distended but no RUQ tenderness.  Anemia of renal disease.  Hemoglobin stable.  Acute encephalopathy, likely uremic encephalopathy, very lethargic yesterday but today appears alert awake.  Mental status seems stable.Nonfocal on exam, ammonia level normal.patient is intermittently confused. Again reevaluated this afternoon was alert awake. Obtain ABG to evaluate given her low bicarb.  Generalized weakness/debility: PT OT as tolerated.  Continue supportive care.   Essential hypertension, benign: Blood pressure stable, I have stopped hydralazine for now.  Continue metoprolol.    Morbid obesity with BMI 36.5.  Advised weight loss, healthy lifestyle.  Diabetes mellitus Type II with diabetic nephropathy, with long-term current use of insulin: Blood sugar again low this morning 60s, patient on D5 W with bicarb drip, encourage adequate oral intake.  Monitor Accu-Chek closely.    Mild leucocytosis: resolved.  OSA not using CPAP at home. On  [place].  Briefly use BiPAP in evening then CPAP but then was on nasal cannula throughout the night.  Grade 2 diastolic dysfunction in TTE 08/2016 with pulm pressure upper limit of normal. Suspect underlying pulm htn with her OSA and not  being compliant with CPAP.  Chronic pain medication use: Holding off to avoid sedation.  Goals of care: Palliative care has been consulted.  I discussed with patient patient's sister and per request changed to DNR 3/15. Patient and sister aware patient is at risk for decompensation.  Patient not interested in long-term dialysis however this is deferred to nephrology and palliative care to discuss.as per nephrology likely be poor candidate for chronic dialysis based on age and comorbid condition, this could be an end-of-life event if she does not wish to pursue dialysis.  DVT prophylaxis: SQ Heparin Code Status: DNR Family Communication: no family at bedside this morning, I discussed with palliative care Disposition Plan: remains inpatient.  Consultants:  Nephrology Palliative care  Procedures: CT abdomen on admission: "Right renal calculi but no obstructing ureteral calculi and no definite renal or bladder mass. 2. Large hiatal hernia. 3. No acute abdominal/pelvic findings, mass lesions or lymphadenopathy. 4. Sigmoid colon diverticulosis without definite findings for acute Diverticulitis."  CT abdomen IMPRESSION 02/12/19: 1. No acute abnormality. 2. Extensive sigmoid diverticulosis. 3. Large hiatal hernia. 4. Small right pleural effusion. 5. Mild bilateral lower lobe atelectasis. 6. Distended gallbladder containing sludge. 7. Right renal calculi Antimicrobials: Anti-infectives (From admission, onward)   None     Objective: Vitals:   02/12/19 2341 02/13/19 0436 02/13/19 0757 02/13/19 1124  BP: (!) 119/47 122/77 120/61 (!) 122/55  Pulse: 73 70 70 69  Resp: 15 (!) 21 17 19   Temp: 98.4 F (36.9 C) 98.6 F (37 C) 97.7 F (36.5 C) 97.9 F (36.6 C)  TempSrc: Oral Oral Oral Oral  SpO2: 100% 100% 100% 95%  Weight:  100.2 kg    Height:        Intake/Output Summary (Last 24 hours) at 02/13/2019 1207 Last data filed at 02/13/2019 1119 Gross per 24 hour  Intake 1089.07 ml    Output 2580 ml  Net -1490.93 ml   Filed Weights   02/11/19 0026 02/12/19 0500 02/13/19 0436  Weight: 96.5 kg 98.6 kg 100.2 kg   Weight change: 1.6 kg  Body mass index is 37.92 kg/m.  Intake/Output from previous day: 03/15 0701 - 03/16 0700 In: 849.1 [P.O.:720; I.V.:29.8; IV Piggyback:99.3] Out: 1680 [Urine:1680] Intake/Output this shift: Total I/O In: 480 [P.O.:480] Out: 900 [Urine:900]  Examination: General exam: Obese, calm, comfortable not in distress  HEENT:Oral mucosa moist, Ear/Nose WNL grossly, dentition normal. Respiratory system: Bilateral diminished breath bilaterally, no use of accessory muscle, non tender on palpation. Cardiovascular system: regular rate and rhythm, S1 & S2 heard, No JVD/murmurs. Gastrointestinal system: Abdomen soft, non-tender, non-distended, Obese, no RUQ tenderness BS +.  Nervous System:Alert, awake and oriented at baseline. Able to move UE and LE, sensation intact. Extremities: Mild  edema, distal peripheral pulses palpable.  Skin: No rashes,no icterus. MSK: Normal muscle bulk,tone, power  Medications:  Scheduled Meds:  aspirin EC  81 mg Oral Daily   budesonide  0.25 mg Nebulization BID   cilostazol  100 mg Oral Daily   DULoxetine  30 mg Oral Daily   heparin  5,000 Units Subcutaneous Q8H   insulin aspart  0-15 Units Subcutaneous TID WC   insulin aspart  0-5 Units Subcutaneous QHS   latanoprost  1 drop Both Eyes QHS   lubiprostone  24 mcg Oral BID   metoprolol succinate  25 mg Oral Daily  pantoprazole  40 mg Oral Daily   potassium chloride  40 mEq Oral Once   senna-docusate  1 tablet Oral BID   simvastatin  40 mg Oral q1800   sodium chloride flush  3 mL Intravenous Once   sodium chloride flush  3 mL Intravenous Q12H   sucralfate  1 g Oral BID AC   timolol  1 drop Both Eyes BID   Continuous Infusions:  furosemide 120 mg (02/13/19 0955)    sodium bicarbonate  infusion 1000 mL 50 mL/hr at 02/12/19 1738     Data Reviewed: I have personally reviewed following labs and imaging studies  CBC: Recent Labs  Lab 02/10/19 1327 02/10/19 1632 02/10/19 2151 02/11/19 0827 02/12/19 0710 02/13/19 0736  WBC 12.2*  --  10.6* 8.4 12.4* 9.1  HGB 10.6* 10.9* 10.4* 9.9* 10.0* 9.1*  HCT 33.8* 32.0* 32.5* 30.9* 33.0* 27.6*  MCV 86.2  --  83.1 84.2 85.9 82.4  PLT 364  --  295 297 354 161   Basic Metabolic Panel: Recent Labs  Lab 02/10/19 1117 02/10/19 1327 02/10/19 1632 02/10/19 2151 02/11/19 0827 02/12/19 0529 02/13/19 0736  NA 139 135 137  --  136   137 137 138  K 3.6 3.6 3.8  --  3.6   3.7 4.6 3.2*  CL 111* 112*  --   --  112*   112* 112* 113*  CO2 15* 14*  --   --  15*   16* 12* 15*  GLUCOSE 44* 88  --   --  82   83 150* 74  BUN 49* 49*  --   --  44*   46* 46* 49*  CREATININE 5.47* 5.66*  --  5.31* 5.26*   5.23* 5.43* 5.88*  CALCIUM 7.5* 7.4*  --   --  7.1*   7.2* 7.1* 6.9*   GFR: Estimated Creatinine Clearance: 8.8 mL/min (A) (by C-G formula based on SCr of 5.88 mg/dL (H)). Liver Function Tests: Recent Labs  Lab 02/10/19 1327 02/11/19 0827 02/13/19 0736  AST 20 17 15   ALT 10 10 10   ALKPHOS 133* 123 114  BILITOT 0.5 0.4 0.4  PROT 7.0 6.3* 6.1*  ALBUMIN 3.1* 2.8* 2.5*   Recent Labs  Lab 02/10/19 1327  LIPASE 24   Recent Labs  Lab 02/12/19 1321  AMMONIA 33   Coagulation Profile: No results for input(s): INR, PROTIME in the last 168 hours. Cardiac Enzymes: No results for input(s): CKTOTAL, CKMB, CKMBINDEX, TROPONINI in the last 168 hours. BNP (last 3 results) No results for input(s): PROBNP in the last 8760 hours. HbA1C: No results for input(s): HGBA1C in the last 72 hours. CBG: Recent Labs  Lab 02/12/19 1151 02/12/19 1647 02/12/19 2156 02/13/19 0749 02/13/19 1124  GLUCAP 100* 89 98 64* 103*   Lipid Profile: No results for input(s): CHOL, HDL, LDLCALC, TRIG, CHOLHDL, LDLDIRECT in the last 72 hours. Thyroid Function Tests: Recent Labs    02/12/19 1321   TSH 1.880   Anemia Panel: Recent Labs    02/12/19 1321  VITAMINB12 272  TIBC 211*  IRON 47   Sepsis Labs: Recent Labs  Lab 02/10/19 1623 02/10/19 2151  LATICACIDVEN 0.7 1.0    Recent Results (from the past 240 hour(s))  Urine culture     Status: None   Collection Time: 02/11/19  5:36 AM  Result Value Ref Range Status   Specimen Description URINE, CLEAN CATCH  Final   Special Requests NONE  Final   Culture  Final    NO GROWTH Performed at [facility], [place] [address], [place], [place] [address]    Report Status 02/12/2019 FINAL  Final      Radiology Studies: Ct Abdomen Pelvis Wo Contrast  Result Date: 02/12/2019 CLINICAL DATA:  Abdominal distention and constipation. EXAM: CT ABDOMEN AND PELVIS WITHOUT CONTRAST TECHNIQUE: Multidetector CT imaging of the abdomen and pelvis was performed following the standard protocol without IV contrast. COMPARISON:  Three hundred thirteen two thousand twenty. FINDINGS: Lower chest: Small right pleural effusion. Mild bilateral lower lobe atelectasis. Large hiatal hernia. Hepatobiliary: Distended gallbladder containing sludge. No gallbladder wall thickening or pericholecystic fluid. Unremarkable liver. Pancreas: Moderate diffuse pancreatic atrophy. Spleen: Normal in size without focal abnormality. Adrenals/Urinary Tract: Stable low density left adrenal mass is with previous density measurements of adenomas. Normal appearing right adrenal gland. Left renal cyst. Right renal calculi. The largest measures 2.0 cm. No bladder or ureteral calculi are seen, obscured in the inferior pelvis by streak artifacts from bilateral hip prostheses. Stomach/Bowel: Large hiatal hernia. Large number of sigmoid colon diverticula, without evidence of diverticulitis. Normal appearing small bowel. No evidence of appendicitis. Vascular/Lymphatic: Atheromatous arterial calcifications without aneurysm. No enlarged lymph nodes. Reproductive: Status post hysterectomy. No  adnexal masses. Other: Small umbilical hernia containing fat. Multiple bilateral subcutaneous injection sites. Musculoskeletal: Bilateral hip prostheses. Lumbar and lower thoracic spine degenerative changes and scoliosis. IMPRESSION: 1. No acute abnormality. 2. Extensive sigmoid diverticulosis. 3. Large hiatal hernia. 4. Small right pleural effusion. 5. Mild bilateral lower lobe atelectasis. 6. Distended gallbladder containing sludge. 7. Right renal calculi. Electronically Signed   By: Claudie Revering M.D.   On: 02/12/2019 16:11   Dg Chest Port 1 View  Result Date: 02/12/2019 CLINICAL DATA:  Shortness of breath.  Congestion. EXAM: PORTABLE CHEST 1 VIEW COMPARISON:  August 05, 2017 FINDINGS: Cardiomegaly and mild pulmonary venous congestion. The hila, mediastinum, lungs, and pleura are otherwise unchanged. IMPRESSION: Cardiomegaly and mild pulmonary venous congestion. Electronically Signed   By: Dorise Bullion III M.D   On: 02/12/2019 15:29      LOS: 3 days   Time spent: More than 50% of that time was spent in counseling and/or coordination of care.  Antonieta Pert, MD [facility]  02/13/2019, 12:07 PM

## 2019-02-13 NOTE — Consult Note (Signed)
Consultation Note Date: 02/13/2019   Patient Name: Michelle Buck  DOB: [birth year]  MRN: [id number]  Age / Sex: 83 y.o., female  PCP: [name], NP Referring Physician: Antonieta Pert, MD  Reason for Consultation: Establishing goals of care  HPI/Patient Profile: 83 y.o. female  with past medical history of T2DM, COPD on home oxygen. CHF, HLD, HTN, and CKD admitted on 02/10/2019 with N/V/D x2weeks. Found to have acute on chronic kidney disease; progressed to  Stage V. Per nephrology, not a good candidate for long-term dialysis. Patient has AMS secondary to uremia. PMT consulted for [place].  Clinical Assessment and Goals of Care: I have reviewed medical records including EPIC notes, labs and imaging, received report from Dr. Lupita Leash and Dr. Justin Mend, assessed the patient and then spoke with patient's sister, [name], to discuss diagnosis prognosis, [place], EOL wishes, disposition and options.  When assessing patient, she tells me "I'm ready to die, tell my sister". Per nursing, patient only intake is a few bites of food.  I introduced Palliative Medicine as specialized medical care for people living with serious illness. It focuses on providing relief from the symptoms and stress of a serious illness. The goal is to improve quality of life for both the patient and the family.   We discussed her current illness and what it means in the larger context of her on-going co-morbidities.  Natural disease trajectory and expectations at EOL were discussed. We specifically discussed her renal failure and patient/family desire to pursue HD. Discussed patient not an HD candidate.We discussed patient's altered mental status d/t uremia.  I attempted to elicit values and goals of care important to the patient.  Sister confirms patient would not want HD.  The difference between aggressive medical intervention and comfort care was considered in light of the patient's goals of care.  We discussed continuing current care - discussed that this likely does not change long-term outcome as patient's kidneys continue to worsen and she does not want HD. Discussed what it would look like to transition focus of care to comfort, dignity, and quality of life.   Hospice and Palliative Care services outpatient were explained and offered. We discussed hospice care at home vs at hospice facility. Discussed patient would need someone with her all the time to go home as she is dependent in ADLs. Discussed the type of care provided at hospice facility.   Patient's sister requests time to visit with patient and discuss options with her other siblings. She has my number to call back if needed. We discussed follow up tomorrow to continue goals of care discussions.   Questions and concerns were addressed.  The family was encouraged to call with questions or concerns.   Primary Decision Maker NEXT OF KIN - siblings making decisions jointly - main contact has been sister [facility]  SUMMARY OF RECOMMENDATIONS   - discussed diagnoses and poor prognosis - introduced comfort care/hospice care - sister requests time to discuss with other family members and will follow up with me tomorrow - continue current care for now, patient appears comfortable  Code Status/Advance Care Planning:  DNR   Symptom Management:   Per primary - appears comfortable, denies pain or other symptoms  Palliative Prophylaxis:   Delirium Protocol, Frequent Pain Assessment and Turn Reposition  Additional Recommendations (Limitations, Scope, Preferences):  No Hemodialysis  Psycho-social/Spiritual:   Desire for further Chaplaincy support:no  Additional Recommendations: Education on Hospice  Prognosis:   < 2 weeks  Discharge Planning: To Be  Determined      Primary Diagnoses: Present on Admission: . Morbid obesity ([place]) . Essential hypertension, benign . Chronic kidney disease (CKD), stage IV  (severe) ([place]) . OSA (obstructive sleep apnea) . Acute renal failure superimposed on stage 4 chronic kidney disease ([place]) . Metabolic acidosis   I have reviewed the medical record, interviewed the patient and family, and examined the patient. The following aspects are pertinent.  Past Medical History:  Diagnosis Date  . Acute bronchitis   . Anxiety   . Arthritis   . Chronic kidney disease, stage II (mild)   . Complications affecting other specified body systems, hypertension   . Congestive heart failure, unspecified    stress test in March at [facility]  . COPD (chronic obstructive pulmonary disease) ([place])    present for several years, diagnosed in the last few years  . Degenerative arthritis of knee, bilateral   . Diabetes mellitus   . Diaphragmatic hernia without mention of obstruction or gangrene   . Disorder of bone and cartilage, unspecified   . GERD (gastroesophageal reflux disease)   . Hiatal hernia   . Hyperlipidemia   . Hypertension   . Hypopotassemia   . Insomnia, unspecified   . Lumbago   . Obese   . Osteoarthrosis, unspecified whether generalized or localized, unspecified site   . PONV (postoperative nausea and vomiting)   . Scoliosis   . Secondary diabetes mellitus with renal manifestations, not stated as uncontrolled, or unspecified(249.40)   . Unspecified glaucoma(365.9)   . Unspecified hereditary and idiopathic peripheral neuropathy    Social History   Socioeconomic History  . Marital status: Widowed    Spouse name: Not on file  . Number of children: Not on file  . Years of education: Not on file  . Highest education level: Not on file  Occupational History  . Occupation: Retired    Fish farm manager: UNEMPLOYED  Social Needs  . Financial resource strain: Not very hard  . Food insecurity:    Worry: Never true    Inability: Never true  . Transportation needs:    Medical: No    Non-medical: No  Tobacco Use  . Smoking status: Former Smoker     Packs/day: 0.50    Years: 3.00    Pack years: 1.50    Types: Cigarettes    Last attempt to quit: 02/27/1997    Years since quitting: 21.9  . Smokeless tobacco: Never Used  . Tobacco comment: remote history - stopped 19 years ago; 20 pack year history  Substance and Sexual Activity  . Alcohol use: No    Alcohol/week: 0.0 standard drinks  . Drug use: No  . Sexual activity: Never  Lifestyle  . Physical activity:    Days per week: 0 days    Minutes per session: 0 min  . Stress: Not at all  Relationships  . Social connections:    Talks on phone: More than three times a week    Gets together: More than three times a week    Attends religious service: Never    Active member of club or organization: No    Attends meetings of clubs or organizations: Never    Relationship status: Widowed  Other Topics Concern  . Not on file  Social History Narrative   Widowed   No children   Never held a steady job   Family History  Problem Relation Age of Onset  . Diabetes Mother   . Heart disease Mother   .  Heart disease Brother   . Diabetes Brother   . Kidney disease Father   . Scoliosis Sister   . Diabetes Brother   . Diabetes Brother   . Heart disease Brother   . Scoliosis Brother   . Stroke Brother   . Heart attack Brother    Scheduled Meds: . aspirin EC  81 mg Oral Daily  . budesonide  0.25 mg Nebulization BID  . cilostazol  100 mg Oral Daily  . DULoxetine  30 mg Oral Daily  . heparin  5,000 Units Subcutaneous Q8H  . insulin aspart  0-15 Units Subcutaneous TID WC  . insulin aspart  0-5 Units Subcutaneous QHS  . latanoprost  1 drop Both Eyes QHS  . lubiprostone  24 mcg Oral BID  . metoprolol succinate  25 mg Oral Daily  . pantoprazole  40 mg Oral Daily  . potassium chloride  40 mEq Oral Once  . senna-docusate  1 tablet Oral BID  . simvastatin  40 mg Oral q1800  . sodium chloride flush  3 mL Intravenous Once  . sodium chloride flush  3 mL Intravenous Q12H  . sucralfate  1 g  Oral BID AC  . timolol  1 drop Both Eyes BID   Continuous Infusions: . furosemide 120 mg (02/13/19 0955)  .  sodium bicarbonate  infusion 1000 mL 50 mL/hr at 02/12/19 1738   PRN Meds:.bisacodyl, ondansetron **OR** ondansetron (ZOFRAN) IV, polyethylene glycol Allergies  Allergen Reactions  . Fluticasone-Salmeterol Other (See Comments)    numbness  . Tramadol Other (See Comments)    Leg cramps   . Codeine Nausea And Vomiting and Other (See Comments)    Patient states N/V with codeine  . Hydrocodone Nausea And Vomiting  . Penicillins Rash and Other (See Comments)    Patient states rash/itch with penicillin Has patient had a PCN reaction causing immediate rash, facial/tongue/throat swelling, SOB or lightheadedness with hypotension: Yes- broke me out and i was itching Has patient had a PCN reaction causing severe rash involving mucus membranes or skin necrosis: No Has patient had a PCN reaction that required hospitalization Yes- i was already in the hospital Has patient had a PCN reaction occurring within the last 10 years: No If all of the above answers are "NO  . Tylenol [Acetaminophen] Hives   Review of Systems  Unable to perform ROS: Mental status change    Physical Exam Constitutional:      General: She is not in acute distress.    Appearance: She is ill-appearing.  Cardiovascular:     Rate and Rhythm: Normal rate and regular rhythm.  Pulmonary:     Effort: Pulmonary effort is normal.     Breath sounds: Normal breath sounds.  Abdominal:     Palpations: Abdomen is soft.  Skin:    General: Skin is warm and dry.  Neurological:     Mental Status: She is lethargic and disoriented.  Psychiatric:        Cognition and Memory: Cognition is impaired. Memory is impaired.     Vital Signs: BP (!) 122/55 (BP Location: Right Wrist)   Pulse 69   Temp 97.9 F (36.6 C) (Oral)   Resp 19   Ht 5\' 4"  (1.626 m)   Wt 100.2 kg   SpO2 95%   BMI 37.92 kg/m  Pain Scale: 0-10   Pain  Score: 0-No pain   SpO2: SpO2: 95 % O2 Device:SpO2: 95 % O2 Flow Rate: .O2 Flow Rate (L/min): 2 L/min  IO: Intake/output summary:   Intake/Output Summary (Last 24 hours) at 02/13/2019 1147 Last data filed at 02/13/2019 1119 Gross per 24 hour  Intake 1089.07 ml  Output 2580 ml  Net -1490.93 ml    LBM: Last BM Date: 02/09/19 Baseline Weight: Weight: 96.2 kg Most recent weight: Weight: 100.2 kg     Palliative Assessment/Data: PPS 20%    Time Total: 70 minutes Greater than 50%  of this time was spent counseling and coordinating care related to the above assessment and plan.  Juel Burrow, DNP, AGNP-C Palliative Medicine Team [phone] Pager: [phone]

## 2019-02-13 NOTE — Progress Notes (Signed)
Foley catheter placed per order; urine output 680 cc.

## 2019-02-13 NOTE — Progress Notes (Signed)
Subjective:  1680 of UOP but BUN and crt slightly worse- she is acidotic.  Note in chart from nursing  "pt and sister have refused dialysis " Objective Vital signs in last 24 hours: Vitals:   02/12/19 2022 02/12/19 2341 02/13/19 0436 02/13/19 0757  BP: (!) 132/58 (!) 119/47 122/77 120/61  Pulse: 79 73 70 70  Resp: 19 15 (!) 21 17  Temp: 97.8 F (36.6 C) 98.4 F (36.9 C) 98.6 F (37 C) 97.7 F (36.5 C)  TempSrc: Axillary Oral Oral Oral  SpO2: 100% 100% 100% 100%  Weight:   100.2 kg   Height:       Weight change: 1.6 kg  Intake/Output Summary (Last 24 hours) at 02/13/2019 1113 Last data filed at 02/13/2019 0900 Gross per 24 hour  Intake 849.07 ml  Output 1680 ml  Net -830.93 ml    Assessment/ Plan: Pt is a 83 y.o. yo female with DM, COPD, diastolic dysfunction and stage 4 CKD who was admitted on 02/10/2019 with abdominal pain and nausea as well as volume overload- crt in the 5's Assessment/Plan: 1. Renal - A on CRF or possibly just progression of CKD- work up negative for reversible causes.  Making urine with IV lasix and no absolute acute indications for HD but numbers worsening and she overall is not improving.  I agree with Dr. Justin Mend that she could likely be poor candidate for chronic dialysis based on age and comorbid conditions - this could be her end of life event if she does not wish to pursue dialysis 2. HTN/vol-  Is overloaded , BP is OK in the 120's - on high dose lasix and toprol- OK to continue both  3. Anemia- not terrible - supportive care for now  4. Metabolic acidosis- will inc rate on sodium bicarb since is making reasonable urine  5. Elytes - give some K- got 40 today   6.  Appreciate palliative care- for discussion today maybe ?? I attempted to engage her today in discussions- not sure she can comprehend.  Is DNR  Louis Meckel    Labs: Basic Metabolic Panel: Recent Labs  Lab 02/11/19 0827 02/12/19 0529 02/13/19 0736  NA 136  137 137 138  K 3.6   3.7 4.6 3.2*  CL 112*  112* 112* 113*  CO2 15*  16* 12* 15*  GLUCOSE 82  83 150* 74  BUN 44*  46* 46* 49*  CREATININE 5.26*  5.23* 5.43* 5.88*  CALCIUM 7.1*  7.2* 7.1* 6.9*   Liver Function Tests: Recent Labs  Lab 02/10/19 1327 02/11/19 0827 02/13/19 0736  AST 20 17 15   ALT 10 10 10   ALKPHOS 133* 123 114  BILITOT 0.5 0.4 0.4  PROT 7.0 6.3* 6.1*  ALBUMIN 3.1* 2.8* 2.5*   Recent Labs  Lab 02/10/19 1327  LIPASE 24   Recent Labs  Lab 02/12/19 1321  AMMONIA 33   CBC: Recent Labs  Lab 02/10/19 1327  02/10/19 2151 02/11/19 0827 02/12/19 0710 02/13/19 0736  WBC 12.2*  --  10.6* 8.4 12.4* 9.1  HGB 10.6*   < > 10.4* 9.9* 10.0* 9.1*  HCT 33.8*   < > 32.5* 30.9* 33.0* 27.6*  MCV 86.2  --  83.1 84.2 85.9 82.4  PLT 364  --  295 297 354 263   < > = values in this interval not displayed.   Cardiac Enzymes: No results for input(s): CKTOTAL, CKMB, CKMBINDEX, TROPONINI in the last 168 hours. CBG: Recent Labs  Lab 02/12/19 0625 02/12/19 1151 02/12/19 1647 02/12/19 2156 02/13/19 0749  GLUCAP 135* 100* 89 98 64*    Iron Studies:  Recent Labs    02/12/19 1321  IRON 47  TIBC 211*   Studies/Results: Ct Abdomen Pelvis Wo Contrast  Result Date: 02/12/2019 CLINICAL DATA:  Abdominal distention and constipation. EXAM: CT ABDOMEN AND PELVIS WITHOUT CONTRAST TECHNIQUE: Multidetector CT imaging of the abdomen and pelvis was performed following the standard protocol without IV contrast. COMPARISON:  Three hundred thirteen two thousand twenty. FINDINGS: Lower chest: Small right pleural effusion. Mild bilateral lower lobe atelectasis. Large hiatal hernia. Hepatobiliary: Distended gallbladder containing sludge. No gallbladder wall thickening or pericholecystic fluid. Unremarkable liver. Pancreas: Moderate diffuse pancreatic atrophy. Spleen: Normal in size without focal abnormality. Adrenals/Urinary Tract: Stable low density left adrenal mass is with previous density measurements  of adenomas. Normal appearing right adrenal gland. Left renal cyst. Right renal calculi. The largest measures 2.0 cm. No bladder or ureteral calculi are seen, obscured in the inferior pelvis by streak artifacts from bilateral hip prostheses. Stomach/Bowel: Large hiatal hernia. Large number of sigmoid colon diverticula, without evidence of diverticulitis. Normal appearing small bowel. No evidence of appendicitis. Vascular/Lymphatic: Atheromatous arterial calcifications without aneurysm. No enlarged lymph nodes. Reproductive: Status post hysterectomy. No adnexal masses. Other: Small umbilical hernia containing fat. Multiple bilateral subcutaneous injection sites. Musculoskeletal: Bilateral hip prostheses. Lumbar and lower thoracic spine degenerative changes and scoliosis. IMPRESSION: 1. No acute abnormality. 2. Extensive sigmoid diverticulosis. 3. Large hiatal hernia. 4. Small right pleural effusion. 5. Mild bilateral lower lobe atelectasis. 6. Distended gallbladder containing sludge. 7. Right renal calculi. Electronically Signed   By: Claudie Revering M.D.   On: 02/12/2019 16:11   Dg Chest Port 1 View  Result Date: 02/12/2019 CLINICAL DATA:  Shortness of breath.  Congestion. EXAM: PORTABLE CHEST 1 VIEW COMPARISON:  August 05, 2017 FINDINGS: Cardiomegaly and mild pulmonary venous congestion. The hila, mediastinum, lungs, and pleura are otherwise unchanged. IMPRESSION: Cardiomegaly and mild pulmonary venous congestion. Electronically Signed   By: Dorise Bullion III M.D   On: 02/12/2019 15:29   Medications: Infusions: . furosemide 120 mg (02/13/19 0955)  .  sodium bicarbonate  infusion 1000 mL 50 mL/hr at 02/12/19 1738    Scheduled Medications: . aspirin EC  81 mg Oral Daily  . budesonide  0.25 mg Nebulization BID  . cilostazol  100 mg Oral Daily  . DULoxetine  30 mg Oral Daily  . heparin  5,000 Units Subcutaneous Q8H  . insulin aspart  0-15 Units Subcutaneous TID WC  . insulin aspart  0-5 Units  Subcutaneous QHS  . latanoprost  1 drop Both Eyes QHS  . lubiprostone  24 mcg Oral BID  . metoprolol succinate  25 mg Oral Daily  . pantoprazole  40 mg Oral Daily  . potassium chloride  40 mEq Oral Once  . senna-docusate  1 tablet Oral BID  . simvastatin  40 mg Oral q1800  . sodium chloride flush  3 mL Intravenous Once  . sodium chloride flush  3 mL Intravenous Q12H  . sucralfate  1 g Oral BID AC  . timolol  1 drop Both Eyes BID    have reviewed scheduled and prn medications.  Physical Exam: General: alert, says she feels better - cannot engage in serious discussions regarding EOL  Heart: RRR Lungs: dec BS at bases Abdomen: obese, soft Extremities: pitting edema to dep areas     02/13/2019,11:13 AM  LOS: 3 days

## 2019-02-14 DIAGNOSIS — Z515 Encounter for palliative care: Secondary | ICD-10-CM

## 2019-02-14 LAB — GLUCOSE, CAPILLARY
Glucose-Capillary: 107 mg/dL — ABNORMAL HIGH (ref 70–99)
Glucose-Capillary: 169 mg/dL — ABNORMAL HIGH (ref 70–99)
Glucose-Capillary: 130 mg/dL — ABNORMAL HIGH (ref 70–99)

## 2019-02-14 LAB — BASIC METABOLIC PANEL
Anion gap: 12 (ref 5–15)
BUN: 49 mg/dL — ABNORMAL HIGH (ref 8–23)
CO2: 21 mmol/L — ABNORMAL LOW (ref 22–32)
Calcium: 7 mg/dL — ABNORMAL LOW (ref 8.9–10.3)
Chloride: 106 mmol/L (ref 98–111)
Creatinine, Ser: 5.77 mg/dL — ABNORMAL HIGH (ref 0.44–1.00)
GFR calc Af Amer: 7 mL/min — ABNORMAL LOW (ref >60)
GFR, EST NON AFRICAN AMERICAN: 6 mL/min — AB (ref >60)
Glucose, Bld: 103 mg/dL — ABNORMAL HIGH (ref 70–99)
POTASSIUM: 2.5 mmol/L — AB (ref 3.5–5.1)
Sodium: 139 mmol/L (ref 135–145)

## 2019-02-14 LAB — PARATHYROID HORMONE, INTACT (NO CA): PTH: 49 pg/mL (ref 15–65)

## 2019-02-14 MED ORDER — LORAZEPAM 2 MG/ML PO CONC: 1.0000 mg | ORAL | Status: DC | PRN

## 2019-02-14 MED ORDER — LORAZEPAM 1 MG PO TABS: 1.0000 mg | ORAL_TABLET | ORAL | Status: DC | PRN

## 2019-02-14 MED ORDER — FUROSEMIDE 10 MG/ML IJ SOLN
80.0000 mg | Freq: Two times a day (BID) | INTRAMUSCULAR | Status: DC
  Administered 2019-02-14 – 2019-02-15 (×2): 80 mg via INTRAVENOUS
  Filled 2019-02-14 (×2): qty 8

## 2019-02-14 MED ORDER — POTASSIUM CHLORIDE CRYS ER 20 MEQ PO TBCR
40.0000 meq | EXTENDED_RELEASE_TABLET | Freq: Once | ORAL | Status: AC
  Administered 2019-02-14: 40 meq via ORAL
  Filled 2019-02-14: qty 2

## 2019-02-14 MED ORDER — OXYCODONE HCL 5 MG PO TABS
5.0000 mg | ORAL_TABLET | ORAL | Status: DC | PRN
  Administered 2019-02-15: 5 mg via ORAL
  Filled 2019-02-14: qty 1

## 2019-02-14 MED ORDER — HYDROMORPHONE HCL 1 MG/ML IJ SOLN
0.2500 mg | INTRAMUSCULAR | Status: DC | PRN
  Administered 2019-02-14 – 2019-02-15 (×3): 0.25 mg via INTRAVENOUS
  Filled 2019-02-14 (×3): qty 1

## 2019-02-14 MED ORDER — HYDROMORPHONE HCL 1 MG/ML IJ SOLN: 0.5000 mg | INTRAMUSCULAR | Status: DC | PRN

## 2019-02-14 MED ORDER — POTASSIUM CHLORIDE 10 MEQ/100ML IV SOLN
10.0000 meq | INTRAVENOUS | Status: DC
  Administered 2019-02-14 (×4): 10 meq via INTRAVENOUS
  Filled 2019-02-14 (×4): qty 100

## 2019-02-14 MED ORDER — LORAZEPAM 2 MG/ML IJ SOLN: 1.0000 mg | INTRAMUSCULAR | Status: DC | PRN

## 2019-02-14 NOTE — TOC Progression Note (Signed)
Transition of Care [facility]) - Progression Note    Patient Details  Name: Michelle Buck MRN: [id number] Date of Birth: [birth year]  Transition of Care [facility]) CM/SW Contact  Michelle Emms, LCSW Phone Number: 02/14/2019, 4:23 PM  Clinical Narrative: Michelle Buck will have a bed available for patient tomorrow, 02/15/2019. [facility] liaison reaching out to patient's sister. CSW to follow.       Expected Discharge Plan: [place] Barriers to Discharge: Hospice Bed not available  Expected Discharge Plan and Services Expected Discharge Plan: [place]   Post Acute Care Choice: Hospice Living arrangements for the past 2 months: Apartment                           Social Determinants of Health (SDOH) Interventions    Readmission Risk Interventions 30 Day Unplanned Readmission Risk Score     ED to Hosp-Admission (Current) from 02/10/2019 in [facility]  30 Day Unplanned Readmission Risk Score (%)  20 Filed at 02/14/2019 1600     This score is the patient's risk of an unplanned readmission within 30 days of being discharged (0 -100%). The score is based on dignosis, age, lab data, medications, orders, and past utilization.   Low:  0-14.9   Medium: 15-21.9   High: 22-29.9   Extreme: 30 and above       No flowsheet data found.

## 2019-02-14 NOTE — Progress Notes (Signed)
Manufacturing engineer [facility])  Received referral from Claudie Revering, for residential hospice at [facility].  Patient and chart under review by [facility] physician at this time, and eligibility is pending.  Left message for sister advising her of above information.    Will follow up with LCSW and family once bed availability determined.  Thank you, Venia Carbon RN, BSN, [facility] Liaison  [phone]

## 2019-02-14 NOTE — Care Management Important Message (Signed)
Important Message  Patient Details  Name: Michelle Buck MRN: [id number] Date of Birth: 11/10/1936   Medicare Important Message Given:  Yes    Hyla Coard P Siah Steely 02/14/2019, 2:02 PM

## 2019-02-14 NOTE — Progress Notes (Signed)
Daily Progress Note   Patient Name: Michelle Buck       Date: 02/14/2019 DOB: 1936/08/28  Age: 83 y.o. MRN#: [id number] Attending Physician: Antonieta Pert, MD Primary Care Physician: [name], NP Admit Date: 02/10/2019  Reason for Consultation/Follow-up: Establishing goals of care  Subjective: C/o back pain, general discomfort. Remains interactive, yet drowsy. 2 sisters and brother at bedside.  Length of Stay: 4  Current Medications: Scheduled Meds:  . budesonide  0.25 mg Nebulization BID  . DULoxetine  30 mg Oral Daily  . furosemide  80 mg Intravenous Q12H  . latanoprost  1 drop Both Eyes QHS  . lubiprostone  24 mcg Oral BID  . senna-docusate  1 tablet Oral BID  . sodium chloride flush  3 mL Intravenous Once  . sodium chloride flush  3 mL Intravenous Q12H  . sucralfate  1 g Oral BID AC  . timolol  1 drop Both Eyes BID    Continuous Infusions:   PRN Meds: bisacodyl, HYDROmorphone (DILAUDID) injection, LORazepam **OR** LORazepam **OR** LORazepam, ondansetron **OR** ondansetron (ZOFRAN) IV, oxyCODONE, polyethylene glycol  Physical Exam Constitutional:      General: She is not in acute distress. Cardiovascular:     Rate and Rhythm: Normal rate and regular rhythm.  Pulmonary:     Effort: Pulmonary effort is normal.  Abdominal:     Palpations: Abdomen is soft.  Musculoskeletal:     Right lower leg: No edema.     Left lower leg: No edema.  Skin:    General: Skin is warm and dry.  Neurological:     Mental Status: She is lethargic and confused.  Psychiatric:        Cognition and Memory: Cognition is impaired. Memory is impaired.             Vital Signs: BP (!) 142/73 (BP Location: Right Wrist)   Pulse 73   Temp 98.2 F (36.8 C) (Oral)   Resp 18   Ht 5\' 4"  (1.626 m)   Wt 97  kg   SpO2 93%   BMI 36.71 kg/m  SpO2: SpO2: 93 % O2 Device: O2 Device: Room Air O2 Flow Rate: O2 Flow Rate (L/min): 3 L/min  Intake/output summary:   Intake/Output Summary (Last 24 hours) at 02/14/2019 1409 Last data filed at 02/14/2019 1100 Gross per 24 hour  Intake 681.64 ml  Output 4700 ml  Net -4018.36 ml   LBM: Last BM Date: 02/12/19 Baseline Weight: Weight: 96.2 kg Most recent weight: Weight: 97 kg       Palliative Assessment/Data: PPS 20%    Flowsheet Rows     Most Recent Value  Intake Tab  Referral Department  Hospitalist  Unit at Time of Referral  Intermediate Care Unit  Palliative Care Primary Diagnosis  Nephrology  Date Notified  02/12/19  Palliative Care Type  New Palliative care  Reason for referral  Clarify Goals of Care  Date of Admission  02/10/19  Date first seen by Palliative Care  02/13/19  # of days Palliative referral response time  1 Day(s)  # of days IP prior to Palliative referral  2  Clinical Assessment  Palliative Performance Scale Score  20%  Psychosocial & Spiritual Assessment  Palliative Care Outcomes  Patient/Family meeting held?  Yes  Who was at the meeting?  sister  [place] goals of care, Counseled regarding hospice, Provided psychosocial or spiritual support      Patient Active Problem List   Diagnosis Date Noted  . Acute kidney injury ([place])   . Goals of care, counseling/discussion   . Palliative care by specialist   . Acute renal failure superimposed on stage 4 chronic kidney disease ([place]) 02/10/2019  . Metabolic acidosis 87/56/4332  . Hiatal hernia   . Poor dentition   . Dysphagia 05/05/2018  . Hypercapnia 05/06/2017  . Osteoarthritis 03/25/2017  . Shoulder pain 03/25/2017  . Abnormality of gait 03/25/2017  . COPD (chronic obstructive pulmonary disease) ([place]) 09/25/2016  . Diastolic CHF ([place]) 95/18/8416  . Chest pressure 09/25/2016  . Dyspnea and respiratory abnormality 09/23/2016  . OSA  (obstructive sleep apnea) 09/02/2016  . Other fatigue 06/23/2016  . PAD (peripheral artery disease) ([place]) 05/21/2016  . Glaucoma 05/21/2016  . Bilateral edema of lower extremity   . Leg edema 01/01/2016  . CKD stage 3 due to type 2 diabetes mellitus ([place])   . CHF (congestive heart failure) ([place]) 10/29/2015  . Chronic obstructive pulmonary disease ([place]) 10/29/2015  . Lumbago   . CKD (chronic kidney disease)   . Chronic kidney disease (CKD), stage IV (severe) ([place]) 09/11/2015  . Diabetes mellitus with diabetic nephropathy, with long-term current use of insulin ([place]) 09/11/2015  . Anemia of chronic kidney failure 09/11/2015  . Morbid obesity ([place]) 08/14/2015  . Peripheral autonomic neuropathy due to diabetes mellitus ([place]) 10/24/2013  . Essential hypertension, benign 02/27/2013  . Acute on chronic diastolic congestive heart failure ([place]) 03/02/2012  . Diabetes mellitus type 2 in obese [facility]) 03/02/2012    Palliative Care Assessment & Plan   HPI: 83 y.o. female  with past medical history of T2DM, COPD on home oxygen. CHF, HLD, HTN, and CKD admitted on 02/10/2019 with N/V/D x2weeks. Found to have acute on chronic kidney disease; progressed to  Stage V. Per nephrology, not a good candidate for long-term dialysis. Patient has AMS secondary to uremia. PMT consulted for [place].  Assessment: Follow up with patient and family. Patient remains the same - labs the same. Discussed with family plan of care moving forward. Family remains firm in decision for no dialysis - they understand she is not a good candidate. They understand she is nearing end of life d/t renal failure. They understand renal failure is affecting her mental status.   We discussed transitioning care to comfort focused care - discussed that current aggressive care does not change "bigger picture". Family expresses understanding and agree they want to focus on comfort. We discussed transition to hospice facility. They ask about the type  of care provided at hospice facility and we discussed comfort focused care. We discussed transitioning current care in the hospital to comfort care and they agree.  Discussed with Dr. Lupita Leash and social work.  Recommendations/Plan:  Comfort care  Social work referral for [employer]  Added prn oxycodone or dilaudid for moderate or severe pain or dyspnea  Added ativan for anxiety  Discontinued medications that do not aid in comfort, d/c tele, d/c labs  Goals of Care and Additional Recommendations:  Limitations on Scope of Treatment: Full Comfort Care  Code Status:  DNR  Prognosis:   < 2 weeks  Discharge Planning:  Hospice facility  Care plan was discussed with patients  sisters, social work, Dr. Lupita Leash  Thank you for allowing the Palliative Medicine Team to assist in the care of this patient.   Total Time 35 minutes Prolonged Time Billed  no       Greater than 50%  of this time was spent counseling and coordinating care related to the above assessment and plan.  Juel Burrow, DNP, [facility] Palliative Medicine Team Team Phone # [phone]  Pager [phone]

## 2019-02-14 NOTE — Progress Notes (Signed)
Subjective:  4300 of UOP !  BUN and crt stable- acidosis is better.  Palliative care has seen pt- seems that pt and sister understand pt is not a dialysis candidate and they have determined they do not want.  She is without complaints this AM  Objective Vital signs in last 24 hours: Vitals:   02/13/19 2041 02/13/19 2225 02/14/19 0532 02/14/19 0921  BP:   (!) 142/73   Pulse:  86 73   Resp:  17 18   Temp:   98.2 F (36.8 C)   TempSrc:   Oral   SpO2: 100% 99% 95% 93%  Weight:   97 kg   Height:       Weight change: -3.2 kg  Intake/Output Summary (Last 24 hours) at 02/14/2019 1051 Last data filed at 02/14/2019 0981 Gross per 24 hour  Intake 921.64 ml  Output 4300 ml  Net -3378.36 ml    Assessment/ Plan: Pt is a 83 y.o. yo female with DM, COPD, diastolic dysfunction and stage 4 CKD who was admitted on 02/10/2019 with abdominal pain and nausea as well as volume overload- crt in the 5's Assessment/Plan: 1. Renal - A on CRF or possibly just progression of CKD- work up negative for reversible causes.  Making much more urine with IV lasix and kidney function stable but poor and she overall is not improving.  Poor candidate for chronic dialysis based on age and comorbid conditions - sounds as if family is accepting of this.  Now that kidney function at least stabilizing could live weeks instead of days 2. HTN/vol-  Is overloaded , BP is OK in the 120's or higher - on high dose lasix and toprol- since made over 4 liters of urine, will decrease dose of lasix 3. Anemia- not terrible - supportive care for now  4. Metabolic acidosis- inc rate on sodium bicarb yest since is making reasonable urine- better  5. Elytes - give some K- got 40 yesterday- will give total of 80 today  6.  Appreciate palliative care- is DNR, seem to be heading toward comfort care appropriately - appreciate palliative care input   [place]: Basic Metabolic Panel: Recent Labs  Lab 02/12/19 0529  02/13/19 0736 02/14/19 0351  NA 137 138 139  K 4.6 3.2* 2.5*  CL 112* 113* 106  CO2 12* 15* 21*  GLUCOSE 150* 74 103*  BUN 46* 49* 49*  CREATININE 5.43* 5.88* 5.77*  CALCIUM 7.1* 6.9* 7.0*   Liver Function Tests: Recent Labs  Lab 02/10/19 1327 02/11/19 0827 02/13/19 0736  AST 20 17 15   ALT 10 10 10   ALKPHOS 133* 123 114  BILITOT 0.5 0.4 0.4  PROT 7.0 6.3* 6.1*  ALBUMIN 3.1* 2.8* 2.5*   Recent Labs  Lab 02/10/19 1327  LIPASE 24   Recent Labs  Lab 02/12/19 1321  AMMONIA 33   CBC: Recent Labs  Lab 02/10/19 1327  02/10/19 2151 02/11/19 0827 02/12/19 0710 02/12/19 1321 02/13/19 0736  WBC 12.2*  --  10.6* 8.4 12.4*  --  9.1  HGB 10.6*   < > 10.4* 9.9* 10.0*  --  9.1*  HCT 33.8*   < > 32.5* 30.9* 33.0* 30.1* 27.6*  MCV 86.2  --  83.1 84.2 85.9  --  82.4  PLT 364  --  295 297 354  --  263   < > = values in this interval not displayed.   Cardiac Enzymes: No results for input(s): CKTOTAL,  CKMB, CKMBINDEX, TROPONINI in the last 168 hours. CBG: Recent Labs  Lab 02/13/19 0749 02/13/19 1124 02/13/19 1651 02/13/19 2012 02/14/19 0753  GLUCAP 64* 103* 129* 94 107*    Iron Studies:  Recent Labs    02/12/19 1321  IRON 47  TIBC 211*   Studies/Results: Ct Abdomen Pelvis Wo Contrast  Result Date: 02/12/2019 CLINICAL DATA:  Abdominal distention and constipation. EXAM: CT ABDOMEN AND PELVIS WITHOUT CONTRAST TECHNIQUE: Multidetector CT imaging of the abdomen and pelvis was performed following the standard protocol without IV contrast. COMPARISON:  Three hundred thirteen two thousand twenty. FINDINGS: Lower chest: Small right pleural effusion. Mild bilateral lower lobe atelectasis. Large hiatal hernia. Hepatobiliary: Distended gallbladder containing sludge. No gallbladder wall thickening or pericholecystic fluid. Unremarkable liver. Pancreas: Moderate diffuse pancreatic atrophy. Spleen: Normal in size without focal abnormality. Adrenals/Urinary Tract: Stable low  density left adrenal mass is with previous density measurements of adenomas. Normal appearing right adrenal gland. Left renal cyst. Right renal calculi. The largest measures 2.0 cm. No bladder or ureteral calculi are seen, obscured in the inferior pelvis by streak artifacts from bilateral hip prostheses. Stomach/Bowel: Large hiatal hernia. Large number of sigmoid colon diverticula, without evidence of diverticulitis. Normal appearing small bowel. No evidence of appendicitis. Vascular/Lymphatic: Atheromatous arterial calcifications without aneurysm. No enlarged lymph nodes. Reproductive: Status post hysterectomy. No adnexal masses. Other: Small umbilical hernia containing fat. Multiple bilateral subcutaneous injection sites. Musculoskeletal: Bilateral hip prostheses. Lumbar and lower thoracic spine degenerative changes and scoliosis. IMPRESSION: 1. No acute abnormality. 2. Extensive sigmoid diverticulosis. 3. Large hiatal hernia. 4. Small right pleural effusion. 5. Mild bilateral lower lobe atelectasis. 6. Distended gallbladder containing sludge. 7. Right renal calculi. Electronically Signed   By: Claudie Revering M.D.   On: 02/12/2019 16:11   Dg Chest Port 1 View  Result Date: 02/12/2019 CLINICAL DATA:  Shortness of breath.  Congestion. EXAM: PORTABLE CHEST 1 VIEW COMPARISON:  August 05, 2017 FINDINGS: Cardiomegaly and mild pulmonary venous congestion. The hila, mediastinum, lungs, and pleura are otherwise unchanged. IMPRESSION: Cardiomegaly and mild pulmonary venous congestion. Electronically Signed   By: Dorise Bullion III M.D   On: 02/12/2019 15:29   Medications: Infusions: . furosemide 120 mg (02/14/19 0745)  .  sodium bicarbonate  infusion 1000 mL 75 mL/hr at 02/14/19 0509    Scheduled Medications: . aspirin EC  81 mg Oral Daily  . budesonide  0.25 mg Nebulization BID  . cilostazol  100 mg Oral Daily  . DULoxetine  30 mg Oral Daily  . heparin  5,000 Units Subcutaneous Q8H  . insulin aspart   0-15 Units Subcutaneous TID WC  . insulin aspart  0-5 Units Subcutaneous QHS  . latanoprost  1 drop Both Eyes QHS  . lubiprostone  24 mcg Oral BID  . metoprolol succinate  25 mg Oral Daily  . pantoprazole  40 mg Oral Daily  . senna-docusate  1 tablet Oral BID  . simvastatin  40 mg Oral q1800  . sodium chloride flush  3 mL Intravenous Once  . sodium chloride flush  3 mL Intravenous Q12H  . sucralfate  1 g Oral BID AC  . timolol  1 drop Both Eyes BID    have reviewed scheduled and prn medications.  Physical Exam: General: alert, says she feels better - cannot engage in serious discussions regarding EOL  Heart: RRR Lungs: dec BS at bases Abdomen: obese, soft Extremities: pitting edema to dep areas     02/14/2019,10:51 AM  LOS: 4 days

## 2019-02-14 NOTE — TOC Initial Note (Signed)
Transition of Care [facility]) - Initial/Assessment Note    Patient Details  Name: Michelle Buck MRN: [id number] Date of Birth: 11-Jan-1936  Transition of Care [facility]) CM/SW Contact:    Estanislado Emms, LCSW Phone Number: 02/14/2019, 2:00 PM  Clinical Narrative: CSW received consult from palliative for residential hospice placement at [facility]. CSW spoke to patient's sister, [name]. [name] confirmed that family has decided for patient to be comfort care and prefer patient go to [facility].   CSW made referral to [place] at [place]. Awaiting follow up for bed availability. CSW to follow and support.                     Expected Discharge Plan: Hospice Medical Facility Barriers to Discharge: Hospice Bed not available   Patient Goals and CMS Choice   CMS Medicare.gov Compare Post Acute Care list provided to:: Other (Comment Required)(N/A) Choice offered to / list presented to : NA  Expected Discharge Plan and Services Expected Discharge Plan: [place] Choice: Hospice Living arrangements for the past 2 months: Apartment                          Prior Living Arrangements/Services Living arrangements for the past 2 months: Apartment Lives with:: Self Patient language and need for interpreter reviewed:: No Do you feel safe going back to the place where you live?: Yes      Need for Family Participation in Patient Care: Yes (Comment) Care giver support system in place?: Yes (comment)   Criminal Activity/Legal Involvement Pertinent to Current Situation/Hospitalization: No - Comment as needed  Activities of Daily Living Home Assistive Devices/Equipment: Wheelchair ADL Screening (condition at time of admission) Patient's cognitive ability adequate to safely complete daily activities?: Yes Is the patient deaf or have difficulty hearing?: Yes Does the patient have difficulty seeing, even when wearing glasses/contacts?: No Does the patient have  difficulty concentrating, remembering, or making decisions?: No Patient able to express need for assistance with ADLs?: Yes Does the patient have difficulty dressing or bathing?: Yes Independently performs ADLs?: No Does the patient have difficulty walking or climbing stairs?: Yes Weakness of Legs: Both Weakness of Arms/Hands: Both  Permission Sought/Granted Permission sought to share information with : Family Supports Permission granted to share information with : No(patient only oriented to self)  Share Information with NAME: Stoney Bang  Permission granted to share info w AGENCY: hospice  Permission granted to share info w Relationship: sister  Permission granted to share info w Contact Information: [phone]  Emotional Assessment Appearance:: Appears stated age Attitude/Demeanor/Rapport: Unable to Assess Affect (typically observed): Unable to Assess Orientation: : Oriented to Self Alcohol / Substance Use: Not Applicable Psych Involvement: No (comment)  Admission diagnosis:  sick Patient Active Problem List   Diagnosis Date Noted  . Acute kidney injury ([place])   . Goals of care, counseling/discussion   . Palliative care by specialist   . Acute renal failure superimposed on stage 4 chronic kidney disease ([place]) 02/10/2019  . Metabolic acidosis 65/46/5035  . Hiatal hernia   . Poor dentition   . Dysphagia 05/05/2018  . Hypercapnia 05/06/2017  . Osteoarthritis 03/25/2017  . Shoulder pain 03/25/2017  . Abnormality of gait 03/25/2017  . COPD (chronic obstructive pulmonary disease) ([place]) 09/25/2016  . Diastolic CHF ([place]) 46/56/8127  . Chest pressure 09/25/2016  . Dyspnea and respiratory abnormality 09/23/2016  . OSA (obstructive sleep apnea) 09/02/2016  . Other  fatigue 06/23/2016  . PAD (peripheral artery disease) ([place]) 05/21/2016  . Glaucoma 05/21/2016  . Bilateral edema of lower extremity   . Leg edema 01/01/2016  . CKD stage 3 due to type 2 diabetes mellitus  ([place])   . CHF (congestive heart failure) ([place]) 10/29/2015  . Chronic obstructive pulmonary disease ([place]) 10/29/2015  . Lumbago   . CKD (chronic kidney disease)   . Chronic kidney disease (CKD), stage IV (severe) ([place]) 09/11/2015  . Diabetes mellitus with diabetic nephropathy, with long-term current use of insulin ([place]) 09/11/2015  . Anemia of chronic kidney failure 09/11/2015  . Morbid obesity ([place]) 08/14/2015  . Peripheral autonomic neuropathy due to diabetes mellitus ([place]) 10/24/2013  . Essential hypertension, benign 02/27/2013  . Acute on chronic diastolic congestive heart failure ([place]) 03/02/2012  . Diabetes mellitus type 2 in obese ([place]) 03/02/2012   PCP:  [name], NP Pharmacy:   [id number] [place], [place] [address] Phone: [phone] Fax: [phone]  [place], [place] [address] [place] [address] [place] [address] [place] [address] Phone: [phone] Fax: [phone]     Social Determinants of Health (SDOH) Interventions    Readmission Risk Interventions 30 Day Unplanned Readmission Risk Score     ED to Hosp-Admission (Current) from 02/10/2019 in [facility]  30 Day Unplanned Readmission Risk Score (%)  22 Filed at 02/14/2019 1200     This score is the patient's risk of an unplanned readmission within 30 days of being discharged (0 -100%). The score is based on dignosis, age, lab data, medications, orders, and past utilization.   Low:  0-14.9   Medium: 15-21.9   High: 22-29.9   Extreme: 30 and above       No flowsheet data found.

## 2019-02-14 NOTE — Progress Notes (Signed)
PROGRESS NOTE    Michelle Buck  GHW:[id number] DOB: Sep 06, 1936 DOA: 02/10/2019 PCP: [name], NP   Brief Narrative: 83 year old female with history of diabetes, COPD, CHF systolic/diastolic dysfunction, diaphragmatic hernia hyperlipidemia, HTN, CKD stage IV, OSA who was having abdominal pain nausea vomiting  for [redacted] weeks along with decreased appetite, was sent from PCP office to the ER.  In the ER found to have AKI on CKD, metabolic acidosis and was admitted for further management. Patient creat is not much better and also with acidosis. Nephrology was consulted 3/15 After disscusison w patient and sister- changed to DNR. Patient having persistent progressive worsening renal failure, also with severe metabolic acidosis. Currently on Lasix IV and bicarbonate drip. Palliative care has been consulted.  Subjective: Seen and examined this morning.  Patient complains of back pain chronic.  Denies nausea vomiting chest pain shortness of breath.  Was comfortable.    Assessment & Plan:   Acute renal failure superimposed on stage IV/V chronic kidney disease: Baseline creatinine 2.5-3.31 October 2018 to February 2020. On admission 5.4., MOT LIKELY progression of her CKD. Creatinine not much improved w ivf.UA with no WBC, negative protein.  CT abdomen pelvis and admission showed nonobstructive right renal calculi.  So far work-up negative for reversible etiology. Patient desires not to have dialysis on long term. Making urine on iv lasix, UOP 4.3 litres. Chest x-ray 3/15 w mild pulm congestion and her wt is up 212-->220 lb->213 lb. Creatinine trend as below in high 5s. nephrology on board.   Recent Labs  Lab 02/10/19 2151 02/11/19 0827 02/12/19 0529 02/13/19 0736 02/14/19 0351  CREATININE 5.31* 5.26*   5.23* 5.43* 7.89* 3.81*   Metabolic acidosis severe, non anion gap: from CKD. Bicarb was at 12 and ph 7.1 3/15, seems to be improving, bicarbonate 21, Cont biicarb drip, making urine  continue.  Hypokalemia repleted with 80 of KCl today.  Repeat labs.    GI symptoms with nausea vomiting at home:no recurrence of symptoms.  Canceled C. difficile and GI panel as patient does not have any diarrhea.  Continue supportive care IV fluid hydration, PPI. Patient has no specific tenderness,has vague abdominal discomfort.  CT abdomen pelvis on admission no acute finding and repat ct w no acute findings, GB distended but no RUQ tenderness.  Anemia of renal disease.  Hemoglobin stable.  Acute encephalopathy, likely uremic encephalopathy, very lethargic yesterday but today appears alert awake.  Mental status seems stable.Nonfocal on exam, ammonia level normal.patient is intermittently confused. Again reevaluated this afternoon was alert awake. Obtain ABG to evaluate given her low bicarb.  Generalized weakness/debility: PT OT as tolerated.  Continue supportive care.   Essential hypertension, benign: BP controlled. have stopped hydralazine for now.  Continue metoprolol.    Morbid obesity with BMI 36.5.  Advised weight loss, healthy lifestyle.  Diabetes mellitus Type II with diabetic nephropathy, with long-term current use of insulin: Blood sugar was in 60s 3/16 am- no stable, patient on D5 W with bicarb drip, encourage adequate oral intake.  Monitor Accu-Chek closely.    Mild leucocytosis: resolved.  OSA not using CPAP at home. On  [place].  Briefly used BiPAP in evening 3/15, refused cpap last night.   Grade 2 diastolic dysfunction in TTE 08/2016 with pulm pressure upper limit of normal. Suspect underlying pulm htn with her OSA and not being compliant with CPAP.  Chronic pain medication use: Holding off to avoid sedation.  Goals of care: Palliative care has been consulted.  I discussed  with patient patient's sister and per request changed to DNR 3/15. Patient and sister aware patient is at risk for decompensation.  Patient not interested in long-term dialysis however this is deferred to  nephrology and palliative care to discuss.as per nephrology likely be poor candidate for chronic dialysis based on age and comorbid condition, this could be an end-of-life event if she does not wish to pursue dialysis.  DVT prophylaxis: SQ Heparin Code Status: DNR Family Communication: no family at bedside this morning. Disposition Plan: remains inpatient.  Appreciate palliative care input, they are meeting with the family, for possible comfort discussion.  Consultants:  Nephrology Palliative care  Procedures: CT abdomen on admission: "Right renal calculi but no obstructing ureteral calculi and no definite renal or bladder mass. 2. Large hiatal hernia. 3. No acute abdominal/pelvic findings, mass lesions or lymphadenopathy. 4. Sigmoid colon diverticulosis without definite findings for acute Diverticulitis."  CT abdomen IMPRESSION 02/12/19: 1. No acute abnormality. 2. Extensive sigmoid diverticulosis. 3. Large hiatal hernia. 4. Small right pleural effusion. 5. Mild bilateral lower lobe atelectasis. 6. Distended gallbladder containing sludge. 7. Right renal calculi Antimicrobials: Anti-infectives (From admission, onward)   None     Objective: Vitals:   02/13/19 2041 02/13/19 2225 02/14/19 0532 02/14/19 0921  BP:   (!) 142/73   Pulse:  86 73   Resp:  17 18   Temp:   98.2 F (36.8 C)   TempSrc:   Oral   SpO2: 100% 99% 95% 93%  Weight:   97 kg   Height:        Intake/Output Summary (Last 24 hours) at 02/14/2019 1323 Last data filed at 02/14/2019 1100 Gross per 24 hour  Intake 681.64 ml  Output 4700 ml  Net -4018.36 ml   Filed Weights   02/12/19 0500 02/13/19 0436 02/14/19 0532  Weight: 98.6 kg 100.2 kg 97 kg   Weight change: -3.2 kg  Body mass index is 36.71 kg/m.  Intake/Output from previous day: 03/16 0701 - 03/17 0700 In: 1161.6 [P.O.:1020; IV Piggyback:141.6] Out: 4300 [Urine:4300] Intake/Output this shift: Total I/O In: -  Out: 1300  [Urine:1300]  Examination:  General exam: Calm, comfortable, obese, not in acute distress, HEENT:Oral mucosa moist, Ear/Nose WNL grossly, dentition normal. Respiratory system: Bilateral equal air entry, no crackles and wheezing, no use of accessory muscle, non tender on palpation. Cardiovascular system: regular rate and rhythm, S1 & S2 heard, No JVD/murmurs. Gastrointestinal system: Abdomen soft, non-tender, non-distended, BS +. No hepatosplenomegaly palpable. Nervous System:Alert, awake and oriented at baseline. Able to move UE and LE, sensation intact. Extremities: no  edema, distal peripheral pulses palpable.  Skin: No rashes,no icterus. MSK: Normal muscle bulk,tone, power  Medications:  Scheduled Meds:  aspirin EC  81 mg Oral Daily   budesonide  0.25 mg Nebulization BID   cilostazol  100 mg Oral Daily   DULoxetine  30 mg Oral Daily   furosemide  80 mg Intravenous Q12H   heparin  5,000 Units Subcutaneous Q8H   insulin aspart  0-15 Units Subcutaneous TID WC   insulin aspart  0-5 Units Subcutaneous QHS   latanoprost  1 drop Both Eyes QHS   lubiprostone  24 mcg Oral BID   metoprolol succinate  25 mg Oral Daily   pantoprazole  40 mg Oral Daily   senna-docusate  1 tablet Oral BID   simvastatin  40 mg Oral q1800   sodium chloride flush  3 mL Intravenous Once   sodium chloride flush  3 mL Intravenous  Q12H   sucralfate  1 g Oral BID AC   timolol  1 drop Both Eyes BID   Continuous Infusions:   sodium bicarbonate  infusion 1000 mL 75 mL/hr at 02/14/19 2947    Data Reviewed: I have personally reviewed following labs and imaging studies  CBC: Recent Labs  Lab 02/10/19 1327 02/10/19 1632 02/10/19 2151 02/11/19 0827 02/12/19 0710 02/12/19 1321 02/13/19 0736  WBC 12.2*  --  10.6* 8.4 12.4*  --  9.1  HGB 10.6* 10.9* 10.4* 9.9* 10.0*  --  9.1*  HCT 33.8* 32.0* 32.5* 30.9* 33.0* 30.1* 27.6*  MCV 86.2  --  83.1 84.2 85.9  --  82.4  PLT 364  --  295 297 354   --  654   Basic Metabolic Panel: Recent Labs  Lab 02/10/19 1327 02/10/19 1632 02/10/19 2151 02/11/19 0827 02/12/19 0529 02/13/19 0736 02/14/19 0351  NA 135 137  --  136   137 137 138 139  K 3.6 3.8  --  3.6   3.7 4.6 3.2* 2.5*  CL 112*  --   --  112*   112* 112* 113* 106  CO2 14*  --   --  15*   16* 12* 15* 21*  GLUCOSE 88  --   --  82   83 150* 74 103*  BUN 49*  --   --  44*   46* 46* 49* 49*  CREATININE 5.66*  --  5.31* 5.26*   5.23* 5.43* 5.88* 5.77*  CALCIUM 7.4*  --   --  7.1*   7.2* 7.1* 6.9* 7.0*   GFR: Estimated Creatinine Clearance: 8.8 mL/min (A) (by C-G formula based on SCr of 5.77 mg/dL (H)). Liver Function Tests: Recent Labs  Lab 02/10/19 1327 02/11/19 0827 02/13/19 0736  AST 20 17 15   ALT 10 10 10   ALKPHOS 133* 123 114  BILITOT 0.5 0.4 0.4  PROT 7.0 6.3* 6.1*  ALBUMIN 3.1* 2.8* 2.5*   Recent Labs  Lab 02/10/19 1327  LIPASE 24   Recent Labs  Lab 02/12/19 1321  AMMONIA 33   Coagulation Profile: No results for input(s): INR, PROTIME in the last 168 hours. Cardiac Enzymes: No results for input(s): CKTOTAL, CKMB, CKMBINDEX, TROPONINI in the last 168 hours. BNP (last 3 results) No results for input(s): PROBNP in the last 8760 hours. HbA1C: No results for input(s): HGBA1C in the last 72 hours. CBG: Recent Labs  Lab 02/13/19 1124 02/13/19 1651 02/13/19 2012 02/14/19 0753 02/14/19 1151  GLUCAP 103* 129* 94 107* 169*   Lipid Profile: No results for input(s): CHOL, HDL, LDLCALC, TRIG, CHOLHDL, LDLDIRECT in the last 72 hours. Thyroid Function Tests: Recent Labs    02/12/19 1321  TSH 1.880   Anemia Panel: Recent Labs    02/12/19 1321  VITAMINB12 272  TIBC 211*  IRON 47   Sepsis Labs: Recent Labs  Lab 02/10/19 1623 02/10/19 2151  LATICACIDVEN 0.7 1.0    Recent Results (from the past 240 hour(s))  Urine culture     Status: None   Collection Time: 02/11/19  5:36 AM  Result Value Ref Range Status   Specimen Description URINE,  CLEAN CATCH  Final   Special Requests NONE  Final   Culture   Final    NO GROWTH Performed at [facility], [place] [address], [place], [place] [address]    Report Status 02/12/2019 FINAL  Final      Radiology Studies: Ct Abdomen Pelvis Wo Contrast  Result Date: 02/12/2019 CLINICAL DATA:  Abdominal distention and constipation. EXAM: CT ABDOMEN AND PELVIS WITHOUT CONTRAST TECHNIQUE: Multidetector CT imaging of the abdomen and pelvis was performed following the standard protocol without IV contrast. COMPARISON:  Three hundred thirteen two thousand twenty. FINDINGS: Lower chest: Small right pleural effusion. Mild bilateral lower lobe atelectasis. Large hiatal hernia. Hepatobiliary: Distended gallbladder containing sludge. No gallbladder wall thickening or pericholecystic fluid. Unremarkable liver. Pancreas: Moderate diffuse pancreatic atrophy. Spleen: Normal in size without focal abnormality. Adrenals/Urinary Tract: Stable low density left adrenal mass is with previous density measurements of adenomas. Normal appearing right adrenal gland. Left renal cyst. Right renal calculi. The largest measures 2.0 cm. No bladder or ureteral calculi are seen, obscured in the inferior pelvis by streak artifacts from bilateral hip prostheses. Stomach/Bowel: Large hiatal hernia. Large number of sigmoid colon diverticula, without evidence of diverticulitis. Normal appearing small bowel. No evidence of appendicitis. Vascular/Lymphatic: Atheromatous arterial calcifications without aneurysm. No enlarged lymph nodes. Reproductive: Status post hysterectomy. No adnexal masses. Other: Small umbilical hernia containing fat. Multiple bilateral subcutaneous injection sites. Musculoskeletal: Bilateral hip prostheses. Lumbar and lower thoracic spine degenerative changes and scoliosis. IMPRESSION: 1. No acute abnormality. 2. Extensive sigmoid diverticulosis. 3. Large hiatal hernia. 4. Small right pleural effusion. 5. Mild bilateral  lower lobe atelectasis. 6. Distended gallbladder containing sludge. 7. Right renal calculi. Electronically Signed   By: Claudie Revering M.D.   On: 02/12/2019 16:11   Dg Chest Port 1 View  Result Date: 02/12/2019 CLINICAL DATA:  Shortness of breath.  Congestion. EXAM: PORTABLE CHEST 1 VIEW COMPARISON:  August 05, 2017 FINDINGS: Cardiomegaly and mild pulmonary venous congestion. The hila, mediastinum, lungs, and pleura are otherwise unchanged. IMPRESSION: Cardiomegaly and mild pulmonary venous congestion. Electronically Signed   By: Dorise Bullion III M.D   On: 02/12/2019 15:29      LOS: 4 days   Time spent: More than 50% of that time was spent in counseling and/or coordination of care.  Antonieta Pert, MD [facility]  02/14/2019, 1:23 PM

## 2019-02-14 NOTE — Progress Notes (Signed)
Patient took pff CPAP mask RT placed patient back on 2L [place].  Patient vitals stable.  RT will continue to monitor.

## 2019-02-15 DIAGNOSIS — R0602 Shortness of breath: Secondary | ICD-10-CM

## 2019-02-15 LAB — GLUCOSE, CAPILLARY
GLUCOSE-CAPILLARY: 105 mg/dL — AB (ref 70–99)
GLUCOSE-CAPILLARY: 94 mg/dL (ref 70–99)
Glucose-Capillary: 136 mg/dL — ABNORMAL HIGH (ref 70–99)

## 2019-02-15 LAB — MAGNESIUM: MAGNESIUM: 0.7 mg/dL — AB (ref 1.7–2.4)

## 2019-02-15 NOTE — TOC Transition Note (Signed)
Transition of Care [facility]) - CM/SW Discharge Note   Patient Details  Name: Michelle Buck MRN: [id number] Date of Birth: [birth year]  [facility]) CM/SW Contact:  Estanislado Emms, LCSW Phone Number: 02/15/2019, 12:04 PM   Clinical Narrative: Patient will discharge to [facility]. Discharge summary has been faxed.   Nurse to call report to [phone].        Final next level of care: [place] Barriers to Discharge: No Barriers Identified   Patient Goals and CMS Choice   CMS Medicare.gov Compare Post Acute Care list provided to:: Other (Comment Required)(NA) Choice offered to / list presented to : NA  Discharge Placement              Patient chooses bed at: Other - please specify in the comment section below:([facility]) Patient to be transferred to facility by: [place] Name of family member notified: Stoney Bang, sister Patient and family notified of of transfer: 02/15/19  Discharge Plan and Services   Post Acute Care Choice: Hospice                    Social Determinants of Health (SDOH) Interventions     Readmission Risk Interventions No flowsheet data found.

## 2019-02-15 NOTE — Progress Notes (Signed)
[facility] room available today for patient. Messages have been left with family requesting return call. Will continue to make contact with sisters listed on face sheet. CSW Manuela Schwartz is aware room is available today. Will update Manuela Schwartz when able to make contact with family. Will need to complete paper work prior to patient transferring to [facility].   Thank you,  Erling Conte, LCSW [phone]  [place] are listed daily on AMION under Hospice and [place]

## 2019-02-15 NOTE — Progress Notes (Signed)
Gave report to [place] at [facility]. PTAR here to get patient around ~1pm. Gave paperwork, report and DNR form. VSS  Saddie Benders RN

## 2019-02-15 NOTE — Progress Notes (Signed)
CRITICAL VALUE ALERT  Critical Value:  Magnesium 0.7  Date & Time Notied:  12:40  Provider Notified: Cristal Ford  Orders Received/Actions taken: Notified MD with lab results. Patient does have discharge orders to [facility] place to continue comfort measure. MD okay to transfer patient with no new orders while here.   Saddie Benders RN

## 2019-02-15 NOTE — Discharge Instructions (Signed)
Hospice °Hospice is a service that is designed to provide people who are terminally ill and their families with medical, spiritual, and psychological support. Its aim is to improve your quality of life by keeping you as comfortable as possible in the final stages of life. °Who will be my providers when I begin hospice care? °Hospice teams often include: °· A nurse. °· A doctor. The hospice doctor will be available for your care, but you can include your regular doctor or nurse practitioner. °· A social worker. °· A counselor. °· A religious leader (such as a chaplain). °· A dietitian. °· Therapists. °· Trained volunteers who can help with care. °What services does hospice provide? °Hospice services can vary depending on the center or organization. Generally, they include: °· Ways to keep you comfortable, such as: °? Providing care in your home or in a home-like setting. °? Working with your family and friends to help meet your needs. °? Allowing you to enjoy the support of loved ones by receiving much of your basic care from family and friends. °· Pain relief and symptom management. The staff will supply all necessary medicines and equipment so that you can stay comfortable and alert enough to enjoy the company of your friends and family. °· Visits or care from a nurse and doctor. This may include 24-hour on-call services. °· Companionship when you are alone. °· Allowing you and your family to rest. Hospice staff may do light housekeeping, prepare meals, and run errands. °· Counseling. They will make sure your emotional, spiritual, and social needs are being met, as well as those needs of your family members. °· Spiritual care. This will be individualized to meet your needs and your family's needs. It may involve: °? Helping you and your family understand the dying process. °? Helping you say goodbye to your family and friends. °? Performing a specific religious ceremony or ritual. °· Massage. °· Nutrition  therapy. °· Physical and occupational therapy. °· Short-term inpatient care, if something cannot be managed in the home. °· Art or music therapy. °· Bereavement support for grieving family members. °When should hospice care begin? °Most people who use hospice are believed to have less than 6 months to live. °· Your family and health care providers can help you decide when hospice services should begin. °· If you live longer than 6 months but your condition does not improve, your doctor may be able to approve you for continued hospice care. °· If your condition improves, you may discontinue the program. °What should I consider before selecting a program? °Most hospice programs are run by nonprofit, independent organizations. Some are affiliated with hospitals, nursing homes, or home health care agencies. Hospice programs can take place in your home or at a hospice center, hospital, or skilled nursing facility. When choosing a hospice program, ask the following questions: °· What services are available to me? °· What services will be offered to my loved ones? °· How involved will my loved ones be? °· How involved will my health care provider be? °· Who makes up the hospice care team? How are they trained or screened? °· How will my pain and symptoms be managed? °· If my circumstances change, can the services be provided in a different setting, such as my home or in the hospital? °· Is the program reviewed and licensed by the state or certified in some other way? °· What does it cost? Is it covered by insurance? °· If I choose a hospice   center or nursing home, where is the hospice center located? Is it convenient for family and friends? °· If I choose a hospice center or nursing home, can my family and friends visit any time? °· Will you provide emotional and spiritual support? °· Who can my family call with questions? °Where can I learn more about hospice? °You can learn about existing hospice programs in your area  from your health care providers. You can also read more about hospice online. The websites of the following organizations have helpful information: °· National Hospice and Palliative Care Organization (NHPCO): www.nhpco.org °· National Association for Home Care & Hospice (NAHC): www.nahc.org °· Hospice Foundation of America (HFA): www.hospicefoundation.org °· American Cancer Society (ACS): www.cancer.org °· Hospice Net: www.hospicenet.org °· Visiting Nurse Associations of [employer] (VNAA): www.vnaa.org °You may also find more information by contacting the following agencies: °· A local agency on aging. °· Your local [employer] Way chapter. °· Your state's department of health or social services. °Summary °· Hospice is a service that is designed to provide people who are terminally ill and their families with medical, spiritual, and psychological support. °· Hospice aims to improve your quality of life by keeping you as comfortable as possible in the final stages of life. °· Hospice teams often include a doctor, nurse, social worker, counselor, religious leader,dietitian, therapists, and volunteers. °· Hospice care generally includes medicine for symptom management, visits from doctors and nurses, physical and occupational therapy, nutrition counseling, spiritual and emotional counseling, caregiver support, and bereavement support for grieving family members. °· Hospice programs can take place in your home or at a hospice center, hospital, or skilled nursing facility. °This information is not intended to replace advice given to you by your health care provider. Make sure you discuss any questions you have with your health care provider. °Document Released: 03/04/2004 Document Revised: 12/08/2016 Document Reviewed: 12/08/2016 °Elsevier Interactive Patient Education © 2019 Elsevier Inc. ° °

## 2019-02-15 NOTE — Discharge Summary (Addendum)
Physician Discharge Summary  Michelle Buck HWE:[id number] DOB: [birth year] DOA: 02/10/2019  PCP: [name], NP  Admit date: 02/10/2019 Discharge date: 02/15/2019  Time spent: 45 minutes  Recommendations for Outpatient Follow-up:  Patient will be discharged to [facility]. Continue comfort measures.  Discharge Diagnoses:  Principal Problem:   Acute renal failure superimposed on stage 4/5chronic kidney disease ([place]) Active Problems: Severe non-anion gap metabolic acidosis Hypokalemia Nausea/vomiting Anemia of renal disease Acute metabolic encephalopathy Generalized weakness/debility Essential hypertension Morbid obesity Diabetes mellitus, type II with diabetic nephropathy Mild leukocytosis Obstructive sleep apnea Chronic diastolic heart failure Goals of care  Discharge Condition: Stable  Diet recommendation: regular  Filed Weights   02/13/19 0436 02/14/19 0532 02/15/19 0340  Weight: 100.2 kg 97 kg 94.5 kg    History of present illness:  On 02/10/2019 by Dr. Randa Spike Michelle Buck is a 83 y.o. female with medical history significant of diabetes, COPD, systolic and diastolic congestive heart failure, diaphragmatic hernia, hypertension, hyperlipidemia, chronic kidney disease stage IV, obstructive sleep apnea who presents with concern for abdominal pain, nausea and vomiting with associated diarrhea.  Patient reports symptoms began as a poor appetite with nausea and inability to eat or drink and it had associated lower abdominal pain.  She is had constant dull abdominal pain rated at a 7 out of 10.  Pain worsens with eating.  And vomiting gets worse with eating.  She does not have a desire to eat much.  When she eats she throws it up.  She denies any urinary symptoms or fever.  She has had a chronic cough and dyspnea when she lays flat which is unchanged from prior.  She has had intermittent episodes of chest pain which is located in the right side but not currently  present at this moment.  Pain improves with pain medications.  She reports some diarrhea even though she does take laxatives chronically for chronic constipation.  Hospital Course:  Acute kidney injury superimposed on chronic kidney disease, stage IV/V -Baseline creatinine 2.5-3.2 in December 2019 through February 2020.  On admission, creatinine was 5.4 -Suspected to be more of a progression of her chronic kidney disease -Patient was placed on IV fluids however creatinine did not improve -CT abdomen pelvis showed nonobstructive right renal calculi -Nephrology consulted and appreciated -Patient did not wish to have dialysis long-term -Palliative care consulted and appreciated  Severe non-anion gap metabolic acidosis -Likely secondary to the above -Patient was placed on a bicarbonate drip -Bicarb did improve  Hypokalemia -replaced  Hypomagnesemia -may need to be replaced at [facility] orally  Nausea/vomiting -Occurred at home, has not recurred since admission -C. difficile and GI pathogen panel were counseled as patient did not report diarrhea -She was placed on IV fluid hydration as well as PPI -CT scan on admission of the abdomen pelvis showed gallbladder was distended however no RUQ tenderness on examination -Continue supportive care  Anemia of renal disease -stable  Acute metabolic encephalopathy -Patient appears to be at baseline and stable -Suspected uremic encephalopathy due to progressive kidney disease -Ammonia level within normal limits  Generalized weakness/debility -Continue supportive care  Essential hypertension -Continue metoprolol -Hydralazine discontinued  Morbid obesity -BMI 36.5  Diabetes mellitus, type II with diabetic nephropathy -Continue with insulin along with CBG monitoring  Mild leukocytosis -Resolved  Obstructive sleep apnea -Patient was not using CPAP at home -She did require BiPAP the evening of 02/12/2019 however refused  CPAP -Continue nasal cannula for comfort  Chronic diastolic heart failure -Noted  on echocardiogram October 2017 with a pulmonary pressure in the upper limits of normal -Suspect secondary to underlying pulmonary hypertension and obstructive sleep apnea with noncompliance of CPAP  Goals of care -Palliative care consulted and appreciated -CODE STATUS currently DNR -Patient is not interested in long-term hemodialysis.  Nephrology also felt patient to be a poor candidate for chronic dialysis based on age and core move good conditions -Patient will be discharged to [facility]  Procedures: None  Consultations: Palliative care Nephrology  Discharge Exam: Vitals:   02/15/19 0724 02/15/19 0753  BP:  133/78  Pulse:  74  Resp:  17  Temp:  98.5 F (36.9 C)  SpO2: 100% 97%     General: Well developed, well nourished, NAD,  Cardiovascular: S1 S2 auscultated,Regular rate and rhythm.  Respiratory: Clear to auscultation bilaterally with equal chest rise  Abdomen: Soft, obese, nontender, nondistended, + bowel sounds  Extremities: warm dry without cyanosis clubbing. ++edema  Discharge Instructions  Allergies as of 02/15/2019      Reactions   Fluticasone-salmeterol Other (See Comments)   numbness   Tramadol Other (See Comments)   Leg cramps    Codeine Nausea And Vomiting, Other (See Comments)   Patient states N/V with codeine   Hydrocodone Nausea And Vomiting   Penicillins Rash, Other (See Comments)   Patient states rash/itch with penicillin Has patient had a PCN reaction causing immediate rash, facial/tongue/throat swelling, SOB or lightheadedness with hypotension: Yes- broke me out and i was itching Has patient had a PCN reaction causing severe rash involving mucus membranes or skin necrosis: No Has patient had a PCN reaction that required hospitalization Yes- i was already in the hospital Has patient had a PCN reaction occurring within the last 10 years: No If all of the  above answers are "NO   Tylenol [acetaminophen] Hives      Medication List    STOP taking these medications   aspirin EC 81 MG tablet   cilostazol 100 MG tablet Commonly known as:  PLETAL   cycloSPORINE 0.05 % ophthalmic emulsion Commonly known as:  RESTASIS   esomeprazole 40 MG capsule Commonly known as:  NEXIUM   hydrALAZINE 50 MG tablet Commonly known as:  APRESOLINE   HYDROcodone-acetaminophen 10-325 MG tablet Commonly known as:  NORCO   metoprolol succinate 25 MG 24 hr tablet Commonly known as:  TOPROL-XL   potassium chloride SA 20 MEQ tablet Commonly known as:  K-DUR,KLOR-CON   simvastatin 40 MG tablet Commonly known as:  ZOCOR     TAKE these medications   budesonide 0.25 MG/2ML nebulizer solution Commonly known as:  Pulmicort USE 1 AMPULE IN NEBULIZER TWICE DAILY ROUTINELY FOR COPD What changed:    how much to take  how to take this  when to take this  additional instructions   DULoxetine 30 MG capsule Commonly known as:  CYMBALTA TAKE 1 CAPSULE BY MOUTH EVERY DAY TO HELP ANXIETY AND PAINS. What changed:  See the new instructions.   insulin aspart protamine- aspart (70-30) 100 UNIT/ML injection Commonly known as:  NOVOLOG MIX 70/30 INJECT 40 UNITS SUBCUTANEOUSLY WITH BREAKFAST AND INJECT 25 UNITS SUBCUTANEOUSLY WITH DINNER TO CONTROL BLOOD SUGAR.   lubiprostone 24 MCG capsule Commonly known as:  AMITIZA Take 1 capsule (24 mcg total) by mouth 2 (two) times daily.   sucralfate 1 g tablet Commonly known as:  CARAFATE TAKE 1 TABLET BY MOUTH TWICE DAILY ON EMPTY STOMACH What changed:  See the new instructions.   timolol 0.5 %  ophthalmic solution Commonly known as:  TIMOPTIC INSTILL 1 DROP INTO EACH EYE TWICE A DAY What changed:  See the new instructions.   Travatan Z 0.004 % Soln ophthalmic solution Generic drug:  Travoprost (BAK Free) Place 1 drop into both eyes at bedtime.      Allergies  Allergen Reactions   Fluticasone-Salmeterol  Other (See Comments)    numbness   Tramadol Other (See Comments)    Leg cramps    Codeine Nausea And Vomiting and Other (See Comments)    Patient states N/V with codeine   Hydrocodone Nausea And Vomiting   Penicillins Rash and Other (See Comments)    Patient states rash/itch with penicillin Has patient had a PCN reaction causing immediate rash, facial/tongue/throat swelling, SOB or lightheadedness with hypotension: Yes- broke me out and i was itching Has patient had a PCN reaction causing severe rash involving mucus membranes or skin necrosis: No Has patient had a PCN reaction that required hospitalization Yes- i was already in the hospital Has patient had a PCN reaction occurring within the last 10 years: No If all of the above answers are "NO   Tylenol [Acetaminophen] Hives      The results of significant diagnostics from this hospitalization (including imaging, microbiology, ancillary and laboratory) are listed below for reference.    Significant Diagnostic Studies: Ct Abdomen Pelvis Wo Contrast  Result Date: 02/12/2019 CLINICAL DATA:  Abdominal distention and constipation. EXAM: CT ABDOMEN AND PELVIS WITHOUT CONTRAST TECHNIQUE: Multidetector CT imaging of the abdomen and pelvis was performed following the standard protocol without IV contrast. COMPARISON:  Three hundred thirteen two thousand twenty. FINDINGS: Lower chest: Small right pleural effusion. Mild bilateral lower lobe atelectasis. Large hiatal hernia. Hepatobiliary: Distended gallbladder containing sludge. No gallbladder wall thickening or pericholecystic fluid. Unremarkable liver. Pancreas: Moderate diffuse pancreatic atrophy. Spleen: Normal in size without focal abnormality. Adrenals/Urinary Tract: Stable low density left adrenal mass is with previous density measurements of adenomas. Normal appearing right adrenal gland. Left renal cyst. Right renal calculi. The largest measures 2.0 cm. No bladder or ureteral calculi are  seen, obscured in the inferior pelvis by streak artifacts from bilateral hip prostheses. Stomach/Bowel: Large hiatal hernia. Large number of sigmoid colon diverticula, without evidence of diverticulitis. Normal appearing small bowel. No evidence of appendicitis. Vascular/Lymphatic: Atheromatous arterial calcifications without aneurysm. No enlarged lymph nodes. Reproductive: Status post hysterectomy. No adnexal masses. Other: Small umbilical hernia containing fat. Multiple bilateral subcutaneous injection sites. Musculoskeletal: Bilateral hip prostheses. Lumbar and lower thoracic spine degenerative changes and scoliosis. IMPRESSION: 1. No acute abnormality. 2. Extensive sigmoid diverticulosis. 3. Large hiatal hernia. 4. Small right pleural effusion. 5. Mild bilateral lower lobe atelectasis. 6. Distended gallbladder containing sludge. 7. Right renal calculi. Electronically Signed   By: Claudie Revering M.D.   On: 02/12/2019 16:11   Ct Abdomen Pelvis Wo Contrast  Result Date: 02/10/2019 CLINICAL DATA:  Nausea and vomiting for 1 week. Lower abdominal pain and poor appetite. EXAM: CT ABDOMEN AND PELVIS WITHOUT CONTRAST TECHNIQUE: Multidetector CT imaging of the abdomen and pelvis was performed following the standard protocol without IV contrast. COMPARISON:  CT scan 05/03/2017 FINDINGS: Lower chest: Streaky bibasilar atelectasis but no infiltrates or effusions. Coronary artery calcifications are noted. There is a large hiatal hernia. Hepatobiliary: No definite hepatic lesions without contrast. The gallbladder is grossly normal. No common bile duct dilatation. Pancreas: Significant fatty atrophy but no mass or ductal dilatation. Spleen: Normal size.  No focal liver lesions. Adrenals/Urinary Tract: Stable left adrenal gland  lesions consistent with benign adenomas. The right adrenal gland is unremarkable. Persistent right renal calculi. The largest calculus measures 18 mm. No hydronephrosis or obstructing ureteral calculi.  No left-sided renal or ureteral calculi. No bladder calculi or obvious bladder mass. Stomach/Bowel: The stomach, duodenum, small bowel and colon are grossly normal without oral contrast. Significant sigmoid colon diverticulosis but no definite findings for acute diverticulitis. There are some scattered air-fluid levels in the transverse colon which can be seen with diarrhea. The terminal ileum appears normal. Vascular/Lymphatic: Advanced atherosclerotic calcifications involving the aorta and iliac arteries. No aneurysm. No mesenteric or retroperitoneal mass or adenopathy. Small scattered lymph nodes are noted. Reproductive: Surgically absent. Other: Examination of the lower pelvis is somewhat limited by bilateral hip prosthesis. Musculoskeletal: Severe scoliosis and degenerative lumbar spondylosis but no acute bony findings. IMPRESSION: 1. Right renal calculi but no obstructing ureteral calculi and no definite renal or bladder mass. 2. Large hiatal hernia. 3. No acute abdominal/pelvic findings, mass lesions or lymphadenopathy. 4. Sigmoid colon diverticulosis without definite findings for acute diverticulitis. Electronically Signed   By: Marijo Sanes M.D.   On: 02/10/2019 18:04   Dg Chest Port 1 View  Result Date: 02/12/2019 CLINICAL DATA:  Shortness of breath.  Congestion. EXAM: PORTABLE CHEST 1 VIEW COMPARISON:  August 05, 2017 FINDINGS: Cardiomegaly and mild pulmonary venous congestion. The hila, mediastinum, lungs, and pleura are otherwise unchanged. IMPRESSION: Cardiomegaly and mild pulmonary venous congestion. Electronically Signed   By: Dorise Bullion III M.D   On: 02/12/2019 15:29    Microbiology: Recent Results (from the past 240 hour(s))  Urine culture     Status: None   Collection Time: 02/11/19  5:36 AM  Result Value Ref Range Status   Specimen Description URINE, CLEAN CATCH  Final   Special Requests NONE  Final   Culture   Final    NO GROWTH Performed at [facility], [address] [place], [place] [address]    Report Status 02/12/2019 FINAL  Final     Labs: Basic Metabolic Panel: Recent Labs  Lab 02/10/19 1327 02/10/19 1632 02/10/19 2151 02/11/19 0827 02/12/19 0529 02/13/19 0736 02/14/19 0351  NA 135 137  --  136   137 137 138 139  K 3.6 3.8  --  3.6   3.7 4.6 3.2* 2.5*  CL 112*  --   --  112*   112* 112* 113* 106  CO2 14*  --   --  15*   16* 12* 15* 21*  GLUCOSE 88  --   --  82   83 150* 74 103*  BUN 49*  --   --  44*   46* 46* 49* 49*  CREATININE 5.66*  --  5.31* 5.26*   5.23* 5.43* 5.88* 5.77*  CALCIUM 7.4*  --   --  7.1*   7.2* 7.1* 6.9* 7.0*   Liver Function Tests: Recent Labs  Lab 02/10/19 1327 02/11/19 0827 02/13/19 0736  AST 20 17 15   ALT 10 10 10   ALKPHOS 133* 123 114  BILITOT 0.5 0.4 0.4  PROT 7.0 6.3* 6.1*  ALBUMIN 3.1* 2.8* 2.5*   Recent Labs  Lab 02/10/19 1327  LIPASE 24   Recent Labs  Lab 02/12/19 1321  AMMONIA 33   CBC: Recent Labs  Lab 02/10/19 1327 02/10/19 1632 02/10/19 2151 02/11/19 0827 02/12/19 0710 02/12/19 1321 02/13/19 0736  WBC 12.2*  --  10.6* 8.4 12.4*  --  9.1  HGB 10.6* 10.9* 10.4* 9.9*  10.0*  --  9.1*  HCT 33.8* 32.0* 32.5* 30.9* 33.0* 30.1* 27.6*  MCV 86.2  --  83.1 84.2 85.9  --  82.4  PLT 364  --  295 297 354  --  263   Cardiac Enzymes: No results for input(s): CKTOTAL, CKMB, CKMBINDEX, TROPONINI in the last 168 hours. BNP: BNP (last 3 results) No results for input(s): BNP in the last 8760 hours.  ProBNP (last 3 results) No results for input(s): PROBNP in the last 8760 hours.  CBG: Recent Labs  Lab 02/14/19 0753 02/14/19 1151 02/14/19 1620 02/14/19 2104 02/15/19 0751  GLUCAP 107* 169* 130* 105* 94       Signed:  Delayna Sparlin  [facility] 02/15/2019, 10:58 AM

## 2019-02-15 NOTE — Progress Notes (Signed)
I see plans for full comfort care, no labs done appropriately.  For transfer to [facility].  If makes it easier , is OK to change lasix to pills.  Renal will sign off, call with questions.   Louis Meckel

## 2019-02-16 DIAGNOSIS — G4733 Obstructive sleep apnea (adult) (pediatric): Secondary | ICD-10-CM | POA: Diagnosis not present

## 2019-02-20 DIAGNOSIS — J449 Chronic obstructive pulmonary disease, unspecified: Secondary | ICD-10-CM | POA: Diagnosis not present

## 2019-03-04 ENCOUNTER — Encounter: Payer: Self-pay | Admitting: *Deleted

## 2019-03-16 DIAGNOSIS — J449 Chronic obstructive pulmonary disease, unspecified: Secondary | ICD-10-CM | POA: Diagnosis not present

## 2019-03-20 DIAGNOSIS — G4733 Obstructive sleep apnea (adult) (pediatric): Secondary | ICD-10-CM | POA: Diagnosis not present

## 2019-03-23 DIAGNOSIS — J449 Chronic obstructive pulmonary disease, unspecified: Secondary | ICD-10-CM | POA: Diagnosis not present

## 2019-03-31 DEATH — deceased

## 2019-04-06 ENCOUNTER — Other Ambulatory Visit: Payer: Self-pay | Admitting: Nurse Practitioner

## 2019-05-12 ENCOUNTER — Ambulatory Visit: Payer: Medicare HMO | Admitting: Nurse Practitioner

## 2019-07-23 IMAGING — CT CT ABDOMEN AND PELVIS WITHOUT CONTRAST
2 of 4 series · 16 of 46 positions shown, 18 images · non-contrast
Comparison: CT scan 05/03/2017

CLINICAL DATA: Nausea and vomiting for 1 week. Lower abdominal pain
and poor appetite.

EXAM:
CT ABDOMEN AND PELVIS WITHOUT CONTRAST
TECHNIQUE: Multidetector CT imaging of the abdomen and pelvis was performed
following the standard protocol without IV contrast.

[Series 3: ap without · axial · non-contrast · 0.92mm/px · z∈[+727,+1162]mm · 13 of 99 slices shown, 15 images]
[im 6/99  soft-tissue]
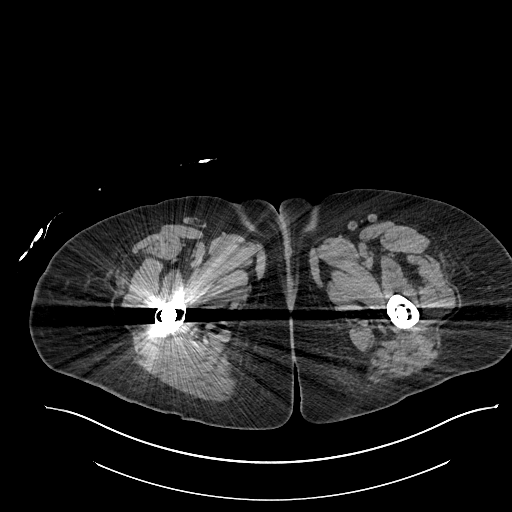
[im 6/99  bone]
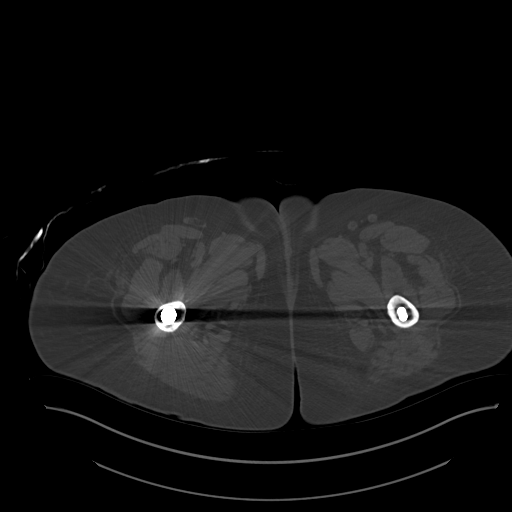
[im 12/99  soft-tissue]
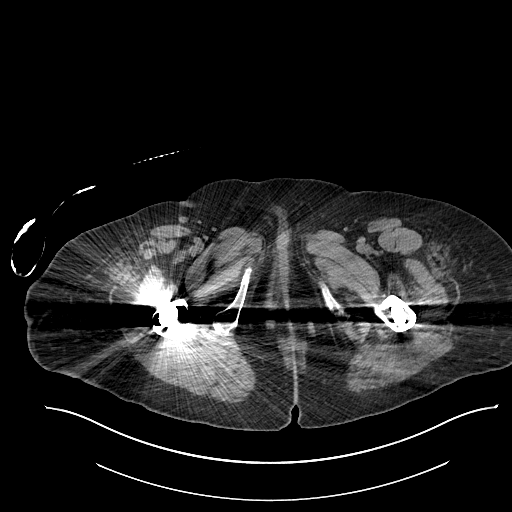
[im 24/99  soft-tissue]
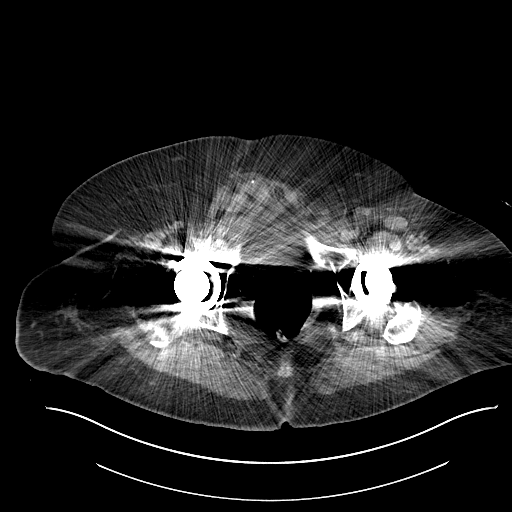
[im 29/99  soft-tissue]
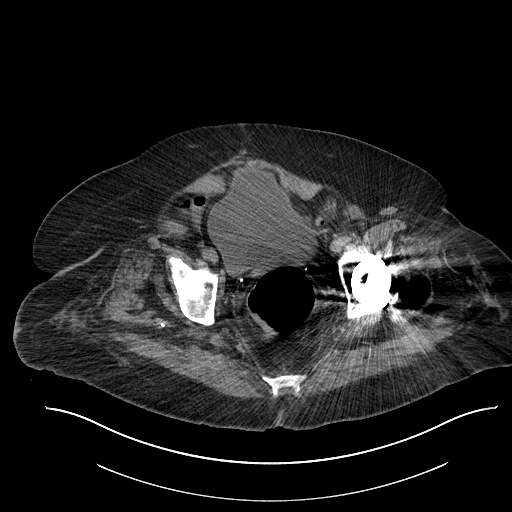
[im 35/99  soft-tissue]
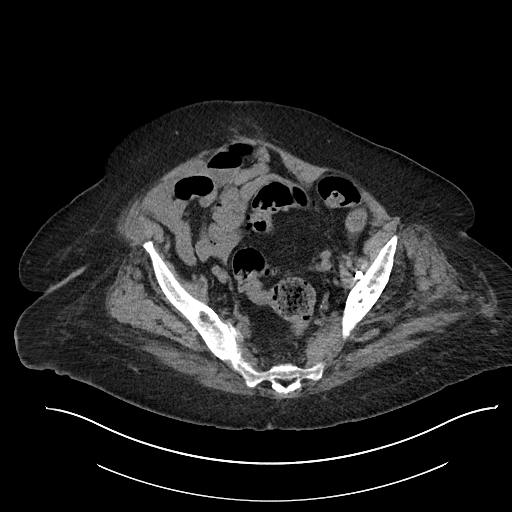
[im 41/99  soft-tissue]
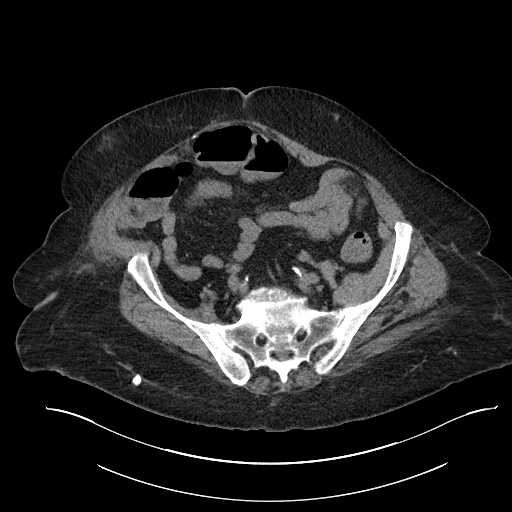
[im 52/99  soft-tissue]
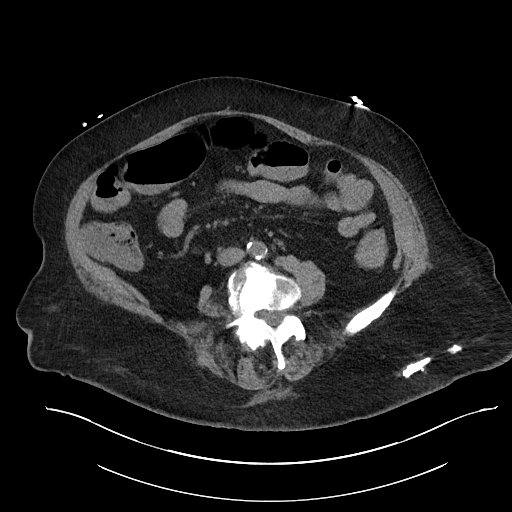
[im 58/99  soft-tissue]
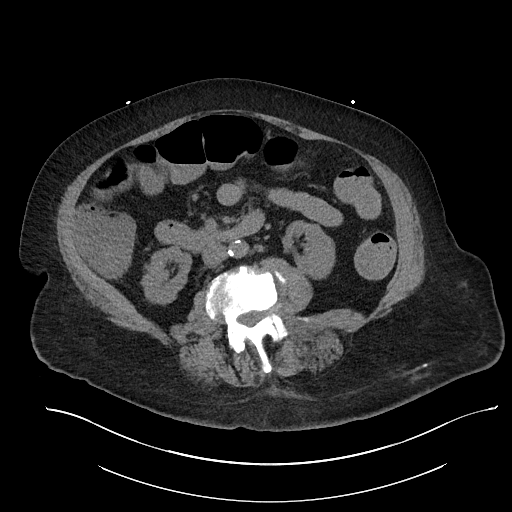
[im 64/99  soft-tissue]
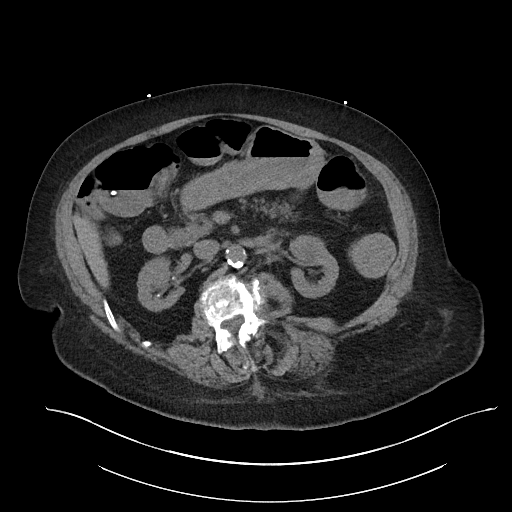
[im 64/99  bone]
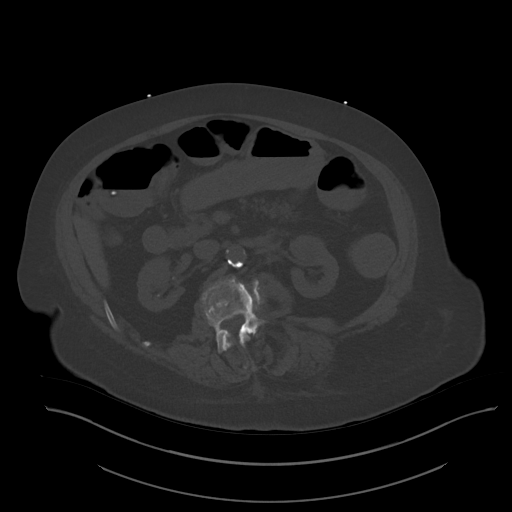
[im 70/99  soft-tissue]
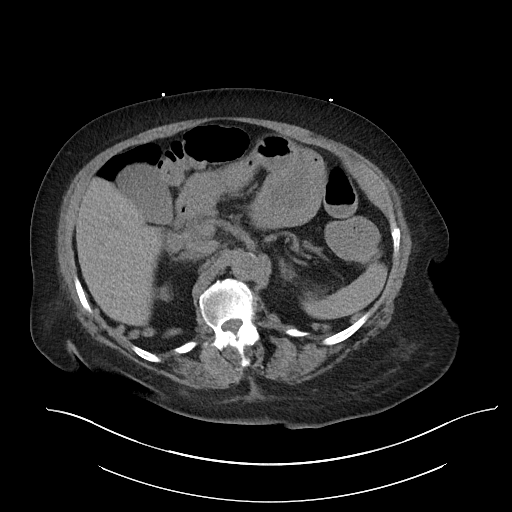
[im 75/99  soft-tissue]
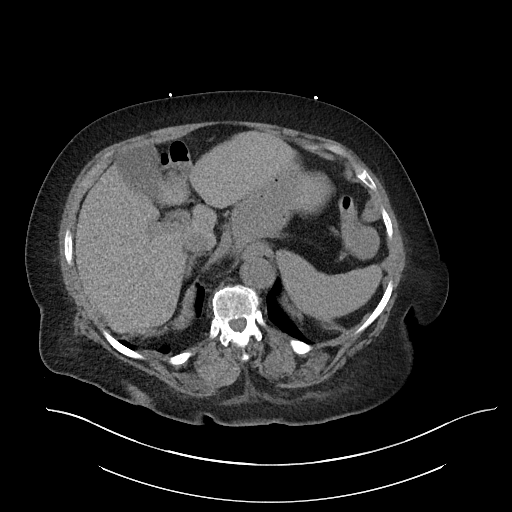
[im 87/99  soft-tissue]
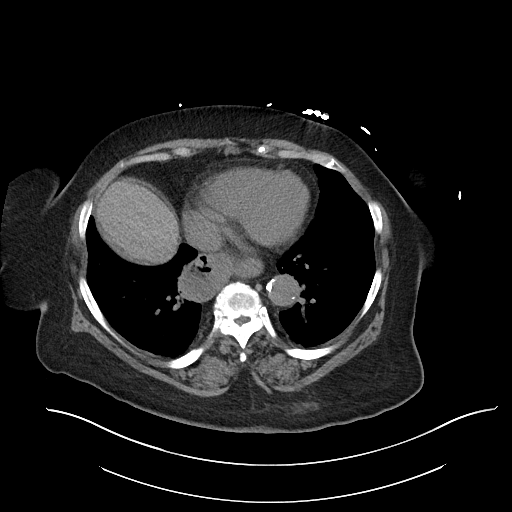
[im 93/99  soft-tissue]
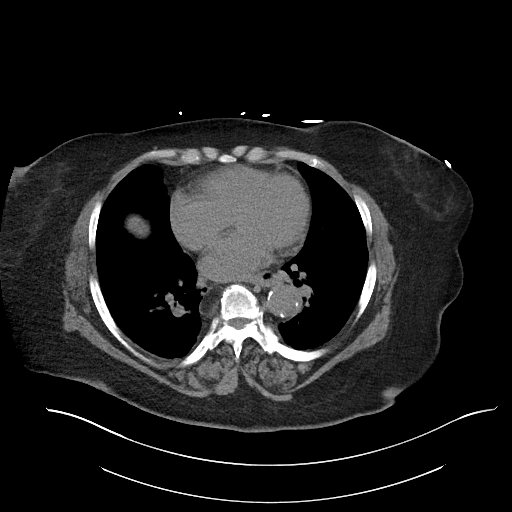

[Series 6: cor · coronal · 0.96mm/px · 3 of 116 slices shown]
[im 39/116  soft-tissue]
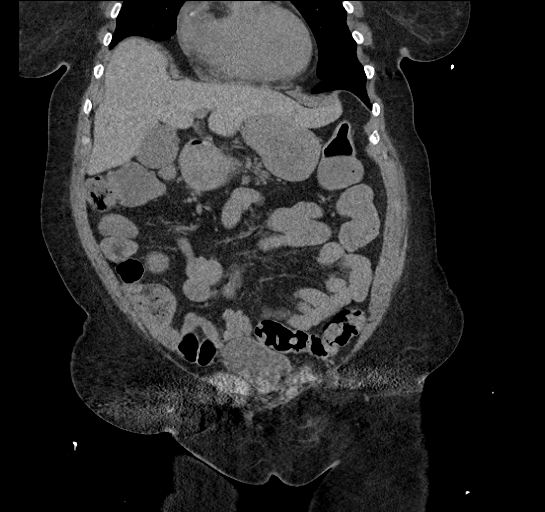
[im 52/116  soft-tissue]
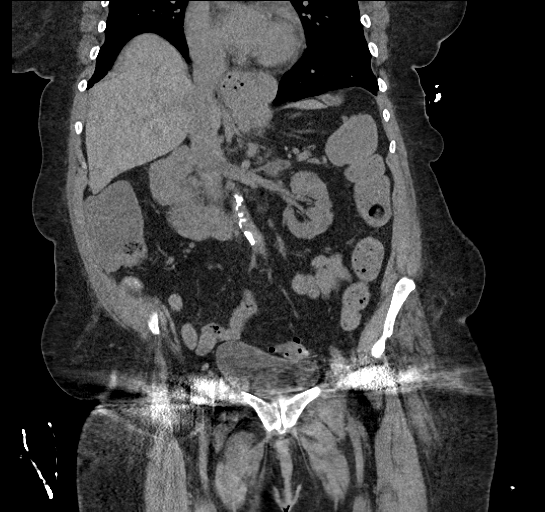
[im 64/116  soft-tissue]
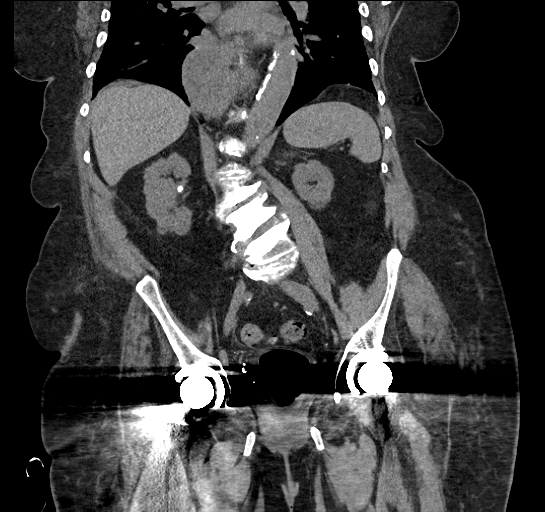

[16 of 46 positions shown; findings below may reference images not displayed]

FINDINGS: Lower chest: Streaky bibasilar atelectasis but no infiltrates or
effusions. Coronary artery calcifications are noted. There is a
large hiatal hernia.

Hepatobiliary: No definite hepatic lesions without contrast. The
gallbladder is grossly normal. No common bile duct dilatation.

Pancreas: Significant fatty atrophy but no mass or ductal
dilatation.

Spleen: Normal size.  No focal liver lesions.

Adrenals/Urinary Tract: Stable left adrenal gland lesions consistent
with benign adenomas. The right adrenal gland is unremarkable.

Persistent right renal calculi. The largest calculus measures 18 mm.
No hydronephrosis or obstructing ureteral calculi.

No left-sided renal or ureteral calculi.

No bladder calculi or obvious bladder mass.

Stomach/Bowel: The stomach, duodenum, small bowel and colon are
grossly normal without oral contrast. Significant sigmoid colon
diverticulosis but no definite findings for acute diverticulitis.
There are some scattered air-fluid levels in the transverse colon
which can be seen with diarrhea. The terminal ileum appears normal.

Vascular/Lymphatic: Advanced atherosclerotic calcifications
involving the aorta and iliac arteries. No aneurysm.

No mesenteric or retroperitoneal mass or adenopathy. Small scattered
lymph nodes are noted.

Reproductive: Surgically absent.

Other: Examination of the lower pelvis is somewhat limited by
bilateral hip prosthesis.

Musculoskeletal: Severe scoliosis and degenerative lumbar
spondylosis but no acute bony findings.
IMPRESSION: 1. Right renal calculi but no obstructing ureteral calculi and no
definite renal or bladder mass.
2. Large hiatal hernia.
3. No acute abdominal/pelvic findings, mass lesions or
lymphadenopathy.
4. Sigmoid colon diverticulosis without definite findings for acute
diverticulitis.
